# Patient Record
Sex: Male | Born: 1935 | Race: White | Hispanic: No | Marital: Married | Smoking: Never smoker
Health system: Southern US, Community
[De-identification: ages and names within clinical notes are randomized; demographics above are authoritative.]

## PROBLEM LIST (undated history)

## (undated) DIAGNOSIS — J45909 Unspecified asthma, uncomplicated: Secondary | ICD-10-CM

## (undated) DIAGNOSIS — K449 Diaphragmatic hernia without obstruction or gangrene: Secondary | ICD-10-CM

## (undated) DIAGNOSIS — M810 Age-related osteoporosis without current pathological fracture: Secondary | ICD-10-CM

## (undated) DIAGNOSIS — J189 Pneumonia, unspecified organism: Secondary | ICD-10-CM

## (undated) DIAGNOSIS — G4710 Hypersomnia, unspecified: Secondary | ICD-10-CM

## (undated) DIAGNOSIS — N529 Male erectile dysfunction, unspecified: Secondary | ICD-10-CM

## (undated) DIAGNOSIS — J309 Allergic rhinitis, unspecified: Secondary | ICD-10-CM

## (undated) DIAGNOSIS — B029 Zoster without complications: Secondary | ICD-10-CM

## (undated) DIAGNOSIS — K253 Acute gastric ulcer without hemorrhage or perforation: Secondary | ICD-10-CM

## (undated) DIAGNOSIS — M1990 Unspecified osteoarthritis, unspecified site: Secondary | ICD-10-CM

## (undated) DIAGNOSIS — M40209 Unspecified kyphosis, site unspecified: Secondary | ICD-10-CM

## (undated) DIAGNOSIS — E785 Hyperlipidemia, unspecified: Secondary | ICD-10-CM

## (undated) DIAGNOSIS — I2510 Atherosclerotic heart disease of native coronary artery without angina pectoris: Secondary | ICD-10-CM

## (undated) DIAGNOSIS — Z5189 Encounter for other specified aftercare: Secondary | ICD-10-CM

## (undated) DIAGNOSIS — D649 Anemia, unspecified: Secondary | ICD-10-CM

## (undated) DIAGNOSIS — G4733 Obstructive sleep apnea (adult) (pediatric): Secondary | ICD-10-CM

## (undated) DIAGNOSIS — M6530 Trigger finger, unspecified finger: Secondary | ICD-10-CM

## (undated) DIAGNOSIS — E559 Vitamin D deficiency, unspecified: Secondary | ICD-10-CM

## (undated) DIAGNOSIS — K219 Gastro-esophageal reflux disease without esophagitis: Secondary | ICD-10-CM

## (undated) DIAGNOSIS — I10 Essential (primary) hypertension: Secondary | ICD-10-CM

## (undated) DIAGNOSIS — R972 Elevated prostate specific antigen [PSA]: Secondary | ICD-10-CM

## (undated) DIAGNOSIS — N400 Enlarged prostate without lower urinary tract symptoms: Secondary | ICD-10-CM

## (undated) DIAGNOSIS — G4730 Sleep apnea, unspecified: Secondary | ICD-10-CM

## (undated) DIAGNOSIS — D126 Benign neoplasm of colon, unspecified: Secondary | ICD-10-CM

## (undated) DIAGNOSIS — M545 Low back pain, unspecified: Secondary | ICD-10-CM

## (undated) HISTORY — DX: Trigger finger, unspecified finger: M65.30

## (undated) HISTORY — DX: Diaphragmatic hernia without obstruction or gangrene: K44.9

## (undated) HISTORY — DX: Atherosclerotic heart disease of native coronary artery without angina pectoris: I25.10

## (undated) HISTORY — DX: Low back pain, unspecified: M54.50

## (undated) HISTORY — DX: Anemia, unspecified: D64.9

## (undated) HISTORY — PX: DENTAL SURGERY: SHX609

## (undated) HISTORY — PX: UPPER GI ENDOSCOPY: SHX6162

## (undated) HISTORY — DX: Sleep apnea, unspecified: G47.30

## (undated) HISTORY — DX: Elevated prostate specific antigen (PSA): R97.20

## (undated) HISTORY — DX: Low back pain: M54.5

## (undated) HISTORY — DX: Male erectile dysfunction, unspecified: N52.9

## (undated) HISTORY — DX: Hypersomnia, unspecified: G47.10

## (undated) HISTORY — DX: Acute gastric ulcer without hemorrhage or perforation: K25.3

## (undated) HISTORY — DX: Age-related osteoporosis without current pathological fracture: M81.0

## (undated) HISTORY — DX: Unspecified asthma, uncomplicated: J45.909

## (undated) HISTORY — DX: Encounter for other specified aftercare: Z51.89

## (undated) HISTORY — DX: Gastro-esophageal reflux disease without esophagitis: K21.9

## (undated) HISTORY — DX: Allergic rhinitis, unspecified: J30.9

## (undated) HISTORY — PX: COLONOSCOPY: SHX174

## (undated) HISTORY — DX: Vitamin D deficiency, unspecified: E55.9

## (undated) HISTORY — DX: Obstructive sleep apnea (adult) (pediatric): G47.33

## (undated) HISTORY — DX: Benign neoplasm of colon, unspecified: D12.6

## (undated) HISTORY — DX: Essential (primary) hypertension: I10

## (undated) HISTORY — DX: Hyperlipidemia, unspecified: E78.5

## (undated) HISTORY — DX: Zoster without complications: B02.9

---
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
## 1998-01-09 ENCOUNTER — Emergency Department (HOSPITAL_COMMUNITY): Admission: EM | Admit: 1998-01-09 | Discharge: 1998-01-10 | Payer: Self-pay | Admitting: Emergency Medicine

## 1998-04-06 HISTORY — PX: ROTATOR CUFF REPAIR: SHX139

## 1998-08-29 ENCOUNTER — Emergency Department (HOSPITAL_COMMUNITY): Admission: EM | Admit: 1998-08-29 | Discharge: 1998-08-29 | Payer: Self-pay | Admitting: Emergency Medicine

## 2001-10-18 ENCOUNTER — Emergency Department (HOSPITAL_COMMUNITY): Admission: EM | Admit: 2001-10-18 | Discharge: 2001-10-18 | Payer: Self-pay

## 2003-03-08 ENCOUNTER — Ambulatory Visit (HOSPITAL_COMMUNITY): Admission: RE | Admit: 2003-03-08 | Discharge: 2003-03-08 | Payer: Self-pay | Admitting: *Deleted

## 2003-03-08 ENCOUNTER — Encounter (INDEPENDENT_AMBULATORY_CARE_PROVIDER_SITE_OTHER): Payer: Self-pay | Admitting: Specialist

## 2003-04-07 LAB — HM COLONOSCOPY: HM Colonoscopy: NORMAL

## 2004-06-01 ENCOUNTER — Ambulatory Visit (HOSPITAL_COMMUNITY): Admission: RE | Admit: 2004-06-01 | Discharge: 2004-06-01 | Payer: Self-pay | Admitting: Allergy

## 2004-12-15 ENCOUNTER — Ambulatory Visit (HOSPITAL_BASED_OUTPATIENT_CLINIC_OR_DEPARTMENT_OTHER): Admission: RE | Admit: 2004-12-15 | Discharge: 2004-12-15 | Payer: Self-pay | Admitting: Otolaryngology

## 2004-12-24 ENCOUNTER — Ambulatory Visit: Payer: Self-pay | Admitting: Internal Medicine

## 2005-02-12 ENCOUNTER — Ambulatory Visit: Payer: Self-pay | Admitting: Internal Medicine

## 2005-03-15 ENCOUNTER — Ambulatory Visit: Payer: Self-pay | Admitting: Internal Medicine

## 2005-07-12 ENCOUNTER — Ambulatory Visit: Payer: Self-pay | Admitting: Internal Medicine

## 2006-07-12 ENCOUNTER — Ambulatory Visit: Payer: Self-pay | Admitting: Internal Medicine

## 2007-09-03 ENCOUNTER — Ambulatory Visit: Payer: Self-pay | Admitting: Internal Medicine

## 2007-09-03 DIAGNOSIS — G2589 Other specified extrapyramidal and movement disorders: Secondary | ICD-10-CM | POA: Insufficient documentation

## 2007-09-03 DIAGNOSIS — G4730 Sleep apnea, unspecified: Secondary | ICD-10-CM

## 2007-09-03 DIAGNOSIS — G4710 Hypersomnia, unspecified: Secondary | ICD-10-CM | POA: Insufficient documentation

## 2007-11-03 ENCOUNTER — Emergency Department (HOSPITAL_BASED_OUTPATIENT_CLINIC_OR_DEPARTMENT_OTHER): Admission: EM | Admit: 2007-11-03 | Discharge: 2007-11-03 | Payer: Self-pay | Admitting: Emergency Medicine

## 2007-11-04 ENCOUNTER — Emergency Department (HOSPITAL_BASED_OUTPATIENT_CLINIC_OR_DEPARTMENT_OTHER): Admission: EM | Admit: 2007-11-04 | Discharge: 2007-11-04 | Payer: Self-pay | Admitting: Emergency Medicine

## 2008-04-29 ENCOUNTER — Emergency Department (HOSPITAL_BASED_OUTPATIENT_CLINIC_OR_DEPARTMENT_OTHER): Admission: EM | Admit: 2008-04-29 | Discharge: 2008-04-29 | Payer: Self-pay | Admitting: Emergency Medicine

## 2008-04-29 ENCOUNTER — Ambulatory Visit: Payer: Self-pay | Admitting: Diagnostic Radiology

## 2008-05-07 HISTORY — PX: CORONARY ARTERY BYPASS GRAFT: SHX141

## 2008-05-17 ENCOUNTER — Ambulatory Visit: Payer: Self-pay | Admitting: Diagnostic Radiology

## 2008-05-17 ENCOUNTER — Emergency Department (HOSPITAL_BASED_OUTPATIENT_CLINIC_OR_DEPARTMENT_OTHER): Admission: EM | Admit: 2008-05-17 | Discharge: 2008-05-17 | Payer: Self-pay | Admitting: Emergency Medicine

## 2008-09-02 ENCOUNTER — Ambulatory Visit: Payer: Self-pay | Admitting: Internal Medicine

## 2008-09-02 DIAGNOSIS — I10 Essential (primary) hypertension: Secondary | ICD-10-CM | POA: Insufficient documentation

## 2008-09-02 DIAGNOSIS — K219 Gastro-esophageal reflux disease without esophagitis: Secondary | ICD-10-CM | POA: Insufficient documentation

## 2008-10-10 ENCOUNTER — Emergency Department (HOSPITAL_BASED_OUTPATIENT_CLINIC_OR_DEPARTMENT_OTHER): Admission: EM | Admit: 2008-10-10 | Discharge: 2008-10-10 | Payer: Self-pay | Admitting: Emergency Medicine

## 2008-10-10 ENCOUNTER — Ambulatory Visit: Payer: Self-pay | Admitting: Diagnostic Radiology

## 2009-01-18 ENCOUNTER — Emergency Department (HOSPITAL_BASED_OUTPATIENT_CLINIC_OR_DEPARTMENT_OTHER): Admission: EM | Admit: 2009-01-18 | Discharge: 2009-01-18 | Payer: Self-pay | Admitting: Emergency Medicine

## 2009-01-18 ENCOUNTER — Ambulatory Visit: Payer: Self-pay | Admitting: Radiology

## 2009-02-04 DIAGNOSIS — Z87898 Personal history of other specified conditions: Secondary | ICD-10-CM | POA: Insufficient documentation

## 2009-02-04 DIAGNOSIS — J40 Bronchitis, not specified as acute or chronic: Secondary | ICD-10-CM | POA: Insufficient documentation

## 2009-02-04 DIAGNOSIS — G4733 Obstructive sleep apnea (adult) (pediatric): Secondary | ICD-10-CM | POA: Insufficient documentation

## 2009-02-09 ENCOUNTER — Ambulatory Visit: Payer: Self-pay | Admitting: Cardiology

## 2009-02-09 ENCOUNTER — Encounter: Payer: Self-pay | Admitting: Cardiology

## 2009-02-09 DIAGNOSIS — R079 Chest pain, unspecified: Secondary | ICD-10-CM | POA: Insufficient documentation

## 2009-02-09 DIAGNOSIS — I2789 Other specified pulmonary heart diseases: Secondary | ICD-10-CM | POA: Insufficient documentation

## 2009-02-17 ENCOUNTER — Ambulatory Visit: Payer: Self-pay | Admitting: Cardiovascular Disease

## 2009-02-17 ENCOUNTER — Ambulatory Visit: Payer: Self-pay | Admitting: Internal Medicine

## 2009-02-17 ENCOUNTER — Encounter: Payer: Self-pay | Admitting: Cardiology

## 2009-02-17 ENCOUNTER — Ambulatory Visit: Payer: Self-pay

## 2009-02-17 ENCOUNTER — Ambulatory Visit (HOSPITAL_COMMUNITY): Admission: RE | Admit: 2009-02-17 | Discharge: 2009-02-17 | Payer: Self-pay | Admitting: Cardiovascular Disease

## 2009-02-18 ENCOUNTER — Emergency Department (HOSPITAL_BASED_OUTPATIENT_CLINIC_OR_DEPARTMENT_OTHER): Admission: EM | Admit: 2009-02-18 | Discharge: 2009-02-18 | Payer: Self-pay | Admitting: Emergency Medicine

## 2009-03-02 ENCOUNTER — Telehealth (INDEPENDENT_AMBULATORY_CARE_PROVIDER_SITE_OTHER): Payer: Self-pay | Admitting: *Deleted

## 2009-03-03 ENCOUNTER — Ambulatory Visit: Payer: Self-pay | Admitting: Cardiovascular Disease

## 2009-03-03 ENCOUNTER — Ambulatory Visit: Payer: Self-pay

## 2009-03-03 ENCOUNTER — Encounter: Payer: Self-pay | Admitting: Cardiology

## 2009-03-03 ENCOUNTER — Encounter (INDEPENDENT_AMBULATORY_CARE_PROVIDER_SITE_OTHER): Payer: Self-pay | Admitting: *Deleted

## 2009-03-03 ENCOUNTER — Encounter (HOSPITAL_COMMUNITY): Admission: RE | Admit: 2009-03-03 | Discharge: 2009-05-04 | Payer: Self-pay | Admitting: Cardiology

## 2009-03-03 DIAGNOSIS — R9430 Abnormal result of cardiovascular function study, unspecified: Secondary | ICD-10-CM | POA: Insufficient documentation

## 2009-03-04 ENCOUNTER — Ambulatory Visit: Payer: Self-pay | Admitting: Cardiology

## 2009-03-04 ENCOUNTER — Ambulatory Visit: Payer: Self-pay | Admitting: Surgery

## 2009-03-04 ENCOUNTER — Inpatient Hospital Stay (HOSPITAL_COMMUNITY): Admission: RE | Admit: 2009-03-04 | Discharge: 2009-03-14 | Payer: Self-pay | Admitting: Cardiology

## 2009-03-04 HISTORY — PX: CARDIAC CATHETERIZATION: SHX172

## 2009-03-05 ENCOUNTER — Encounter: Payer: Self-pay | Admitting: Cardiology

## 2009-03-09 ENCOUNTER — Encounter: Payer: Self-pay | Admitting: Cardiology

## 2009-03-14 ENCOUNTER — Encounter: Payer: Self-pay | Admitting: Cardiology

## 2009-03-17 ENCOUNTER — Telehealth (INDEPENDENT_AMBULATORY_CARE_PROVIDER_SITE_OTHER): Payer: Self-pay | Admitting: Physician Assistant

## 2009-03-17 ENCOUNTER — Ambulatory Visit: Payer: Self-pay | Admitting: Radiology

## 2009-03-17 ENCOUNTER — Emergency Department (HOSPITAL_BASED_OUTPATIENT_CLINIC_OR_DEPARTMENT_OTHER): Admission: EM | Admit: 2009-03-17 | Discharge: 2009-03-17 | Payer: Self-pay | Admitting: Emergency Medicine

## 2009-03-18 ENCOUNTER — Encounter: Payer: Self-pay | Admitting: Cardiology

## 2009-03-18 ENCOUNTER — Ambulatory Visit: Payer: Self-pay | Admitting: Cardiothoracic Surgery

## 2009-03-21 ENCOUNTER — Telehealth (INDEPENDENT_AMBULATORY_CARE_PROVIDER_SITE_OTHER): Payer: Self-pay | Admitting: *Deleted

## 2009-03-24 ENCOUNTER — Encounter: Payer: Self-pay | Admitting: Cardiology

## 2009-03-25 ENCOUNTER — Ambulatory Visit: Payer: Self-pay | Admitting: Cardiothoracic Surgery

## 2009-04-05 ENCOUNTER — Telehealth: Payer: Self-pay | Admitting: Cardiology

## 2009-04-06 ENCOUNTER — Encounter: Payer: Self-pay | Admitting: Cardiology

## 2009-04-06 ENCOUNTER — Ambulatory Visit: Payer: Self-pay | Admitting: Cardiology

## 2009-04-06 DIAGNOSIS — E785 Hyperlipidemia, unspecified: Secondary | ICD-10-CM | POA: Insufficient documentation

## 2009-04-06 DIAGNOSIS — I2510 Atherosclerotic heart disease of native coronary artery without angina pectoris: Secondary | ICD-10-CM | POA: Insufficient documentation

## 2009-04-07 ENCOUNTER — Ambulatory Visit: Payer: Self-pay | Admitting: Thoracic Surgery (Cardiothoracic Vascular Surgery)

## 2009-04-07 ENCOUNTER — Encounter: Payer: Self-pay | Admitting: Cardiology

## 2009-04-07 ENCOUNTER — Telehealth (INDEPENDENT_AMBULATORY_CARE_PROVIDER_SITE_OTHER): Payer: Self-pay | Admitting: *Deleted

## 2009-04-07 ENCOUNTER — Encounter
Admission: RE | Admit: 2009-04-07 | Discharge: 2009-04-07 | Payer: Self-pay | Admitting: Thoracic Surgery (Cardiothoracic Vascular Surgery)

## 2009-04-11 ENCOUNTER — Telehealth (INDEPENDENT_AMBULATORY_CARE_PROVIDER_SITE_OTHER): Payer: Self-pay | Admitting: *Deleted

## 2009-05-19 ENCOUNTER — Encounter: Admission: RE | Admit: 2009-05-19 | Discharge: 2009-05-19 | Payer: Self-pay | Admitting: Internal Medicine

## 2009-06-01 ENCOUNTER — Telehealth (INDEPENDENT_AMBULATORY_CARE_PROVIDER_SITE_OTHER): Payer: Self-pay | Admitting: *Deleted

## 2009-06-13 ENCOUNTER — Encounter: Payer: Self-pay | Admitting: Cardiology

## 2009-06-14 ENCOUNTER — Encounter: Admission: RE | Admit: 2009-06-14 | Discharge: 2009-09-12 | Payer: Self-pay | Admitting: Orthopedic Surgery

## 2009-07-18 ENCOUNTER — Encounter: Payer: Self-pay | Admitting: Cardiology

## 2009-08-01 ENCOUNTER — Emergency Department (HOSPITAL_BASED_OUTPATIENT_CLINIC_OR_DEPARTMENT_OTHER): Admission: EM | Admit: 2009-08-01 | Discharge: 2009-08-01 | Payer: Self-pay | Admitting: Emergency Medicine

## 2009-11-02 ENCOUNTER — Ambulatory Visit: Payer: Self-pay | Admitting: Cardiology

## 2009-11-02 ENCOUNTER — Encounter: Payer: Self-pay | Admitting: Cardiology

## 2009-11-09 ENCOUNTER — Encounter: Payer: Self-pay | Admitting: Internal Medicine

## 2009-11-09 ENCOUNTER — Ambulatory Visit: Payer: Self-pay | Admitting: Diagnostic Radiology

## 2009-11-09 ENCOUNTER — Emergency Department (HOSPITAL_BASED_OUTPATIENT_CLINIC_OR_DEPARTMENT_OTHER): Admission: EM | Admit: 2009-11-09 | Discharge: 2009-11-09 | Payer: Self-pay | Admitting: Emergency Medicine

## 2009-11-10 ENCOUNTER — Ambulatory Visit: Payer: Self-pay | Admitting: Internal Medicine

## 2009-11-10 DIAGNOSIS — B370 Candidal stomatitis: Secondary | ICD-10-CM | POA: Insufficient documentation

## 2010-06-06 NOTE — Assessment & Plan Note (Signed)
Summary: [facility]   Visit Type:  Follow-up Primary Provider:  Art Chilton Si   History of Present Illness: Pleasant gentleman evaluated in October, 2010 for question pulmonary hypertension. An echocardiogram was ordered and showed an ejection fraction of 50-55% with apical and septal hypokinesis. A Myoview was therefore performed and showed severe anterior ischemia and an ejection fraction of 47%. He  underwent cardiac catheterization on March 04, 2009 and this revealed severe three-vessel coronary disease as well as an 80% left main. His ejection fraction was 50%. He ultimately underwent coronary artery bypass and graft on November 1 (left internal mammary artery to left anterior descending, sequential saphenous vein graft to ramus intermedius and obtuse marginal 2, saphenous vein graft to posterior descending). I last saw him in Dec 2010. Since then, the patient denies any dyspnea on exertion, orthopnea, PND, pedal edema, palpitations, syncope or chest pain.   Current Medications (verified): 1)  Advair Diskus 100-50 Mcg/dose  Misc (Fluticasone-Salmeterol) .... Inhale 1 Puff Two Times A Day 2)  Omeprazole 20 Mg  Cpdr (Omeprazole) .... Take 1 Capsule By Mouth Two Times A Day 3)  Atenolol 50 Mg  Tabs (Atenolol) .... Take 1 Tablet By Mouth Once A Day 4)  Avodart 0.5 Mg  Caps (Dutasteride) .... Take 1 Capsule By Mouth Once A Day 5)  Doxazosin Mesylate 4 Mg  Tabs (Doxazosin Mesylate) .... Take 1 Tablet By Mouth Once A Day 6)  Singulair 10 Mg  Tabs (Montelukast Sodium) .... Take 1 Tablet By Mouth Once A Day 7)  Tylenol 325 Mg  Tabs (Acetaminophen) .... Per Bottle 8)  Xyzal 5 Mg Tabs (Levocetirizine Dihydrochloride) .... Take 1 Tablet By Mouth Once A Day 9)  Cpap Autopap 4-20 Ranges 6-11 .... Advanced 10)  Oxycodone-Acetaminophen 5-500 Mg Caps (Oxycodone-Acetaminophen) .... As Needed 11)  Crestor 20 Mg Tabs (Rosuvastatin Calcium) .... Take 1 Tablet By Mouth Once A Day 12)  Aspirin Ec 325 Mg  Tbec (Aspirin) .... 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  Alcohol Use - yes  Review of Systems       no fevers or chills, productive cough, hemoptysis, dysphasia, odynophagia, melena, hematochezia, dysuria, hematuria, rash, seizure activity, orthopnea, PND, pedal edema, claudication. Remaining systems are negative.   Vital Signs:  Patient profile:   75 year old male Height:      67 inches Pulse rate:   73 / minute Pulse rhythm:   regular BP sitting:   110 / 60  (left arm) Cuff size:   large  Vitals Entered By: Vikki Ports (November 02, 2009 8:54 AM)  Physical Exam  General:  Well-developed well-nourished in no acute distress.  Skin is warm and dry.  HEENT is normal.  Neck is supple. No thyromegaly.  Chest is clear to auscultation with normal expansion.  Cardiovascular exam is regular rate and rhythm.  Abdominal exam nontender or distended. No masses palpated. Extremities show no edema. neuro grossly intact    EKG  Procedure date:  11/02/2009  Findings:      Normal sinus rhythm at  a rate of 73. Axis normal. Nonspecific T-wave changes.  Impression & Recommendations:  Problem # 1:  PURE HYPERCHOLESTEROLEMIA (ICD-272.0) Continue statin. Lipids and liver monitored by primary care. His updated medication list for this problem includes:    Crestor 20 Mg Tabs (Rosuvastatin calcium) .Marland Kitchen... Take 1  tablet by mouth once a day  His updated medication list for this problem includes:    Crestor 20 Mg Tabs (Rosuvastatin calcium) .Marland Kitchen... Take 1 tablet by mouth once a day  Problem # 2:  CAD (ICD-414.00) No recent chest pain. Continue aspirin, beta blocker and statin. Continue risk factor modification. His updated medication list for this problem includes:    Atenolol 50 Mg Tabs (Atenolol) .Marland Kitchen... Take 1 tablet by mouth once a day    Aspirin Ec 325 Mg Tbec (Aspirin) .Marland Kitchen... Take one tablet by mouth daily  Problem # 3:  HYPERTENSION (ICD-401.9)  Blood pressure controlled on present medications. Will continue. Renal function and potassium monitored by primary care.  His updated medication list for this problem includes:    Atenolol 50 Mg Tabs (Atenolol) .Marland Kitchen... Take 1 tablet by mouth once a day    Doxazosin Mesylate 4 Mg Tabs (Doxazosin mesylate) .Marland Kitchen... Take 1 tablet by mouth once a day    Aspirin Ec 325 Mg Tbec (Aspirin) .Marland Kitchen... Take one tablet by mouth daily  Problem # 4:  HYPERTENSION, PULMONARY (ICD-416.8) Most likely secondary to history of obstructive sleep apnea.  Problem # 5:  G E R D (ICD-530.81)  His updated medication list for this problem includes:    Omeprazole 20 Mg Cpdr (Omeprazole) .Marland Kitchen... Take 1 capsule by mouth two times a day  Problem # 6:  SLEEP APNEA (ICD-780.57)  Patient Instructions: 1)  Your physician recommends that you schedule a follow-up appointment in: ONE YEAR

## 2010-06-06 NOTE — Progress Notes (Signed)
  [employer] request for Records,forwarded to [facility]  June 01, 2009 11:28 AM

## 2010-06-06 NOTE — Letter (Signed)
Summary: [facility]  [facility]   Imported By: Marylou Mccoy 07/13/2009 08:40:15  _____________________________________________________________________  External Attachment:    Type:   Image     Comment:   External Document

## 2010-06-06 NOTE — Letter (Signed)
Summary: [facility] - [facility]  [facility] - Cardiac Treatment Plan   Imported By: Marylou Mccoy 10/21/2009 17:56:16  _____________________________________________________________________  External Attachment:    Type:   Image     Comment:   External Document

## 2010-06-06 NOTE — Assessment & Plan Note (Signed)
Summary: rov//mbw   Primary Provider/Referring Provider:  Art Chilton Si  CC[facility], sob wheezing, and followup pulmonary hypertension.  History of Present Illness: History of Present Illness: 09/03/07- Brent Griffith returns for follow up of his sleep apnea.  He says he is doing much better with CPAP.  He is comfortable and used to it.  Now  using it every night.  It is set on auto titration, but he says the pressure holds around 10 CWP.  With this his wife tells him he does not snore.  The family dog keeps him awake barking at night, more than the other problems.  .  He still has nocturia several times at night.  He mentions a little bit of a seasonal rhinitis this spring, but not bad.  09/02/08- OSA, Periodic limb movement Notes increased seasonal allergy this year. Sees Dr [place] Callas. CPAP autotitration permanently 4-20 cwp, usually holding around 10. Advanced, full face mask. Discussed care of humidifier. Discussed adjustment of autotitration mode.  February 17, 2009- OSA, Periodic limb movement., PHTN...Marland KitchenMarland KitchenDr [place] Callas for allergy/ Asthma Being evaluatd by cardiology for question og PHTN, pending Echo. Compliant everyt night with CPAP- using Auto PAP 4-20, usually ranging around 6-11. We discussed his cleaning program, mask replacement and general care. Wife tells he doesn't snore through. He feels well rested. He denies change or problem with his asthma control. Denies shortness of breath. Had PFT at Dr Lyla Son. Known T3 compression fx with associated radicular pains.  November 10, 2009- OSA, Periodic limb movement, CAD/CABG, Dr [place] Callas for allergy/asthma....wife here Had CABG Mar 07, 2009. Cardiology attributed PHTN to his hx of OSA. Went to [employer] last night, feeling tight in throat, with dry cough. They gave neb and pred 60 mg yest, then 40 mg x 5 days. Wife's Home nebulizer didn't help. Has been exercising regularly on treadmill. A day before cough began, he was exposed to  fresh paint smell. He thinks this irritated his airway.    Preventive Screening-Counseling & Management  Alcohol-Tobacco     Smoking Status: never  Current Medications (verified): 1)  Advair Diskus 100-50 Mcg/dose  Misc (Fluticasone-Salmeterol) .... Inhale 1 Puff Two Times A Day 2)  Omeprazole 40 Mg Cpdr (Omeprazole) .... Take 1 By Mouth Once Daily 3)  Atenolol 50 Mg  Tabs (Atenolol) .... Take 1 Tablet By Mouth Once A Day 4)  Avodart 0.5 Mg  Caps (Dutasteride) .... Take 1 Capsule By Mouth Once A Day 5)  Doxazosin Mesylate 4 Mg  Tabs (Doxazosin Mesylate) .... Take 1 Tablet By Mouth Once A Day 6)  Singulair 10 Mg  Tabs (Montelukast Sodium) .... Take 1 Tablet By Mouth Once A Day 7)  Xyzal 5 Mg Tabs (Levocetirizine Dihydrochloride) .... Take 1 Tablet By Mouth Once A Day 8)  Cpap Autopap 4-20 Ranges 6-11 .... Advanced 9)  Crestor 20 Mg Tabs (Rosuvastatin Calcium) .... Take 1 Tablet By Mouth Once A Day 10)  Aspirin Ec 325 Mg Tbec (Aspirin) .... Take One Tablet By Mouth Daily 11)  Astepro 0.15 % Soln (Azelastine Hcl) .Marland Kitchen.. 1-2 Sprays in Each Nostril 12)  Calcium Citrate 250 Mg Tabs (Calcium Citrate) .... Take 1 By Mouth Once Daily 13)  Cinnamon 500 Mg Caps (Cinnamon) .... Take 1 By Mouth Once Daily 14)  Coq10 100 Mg Caps (Coenzyme Q10) .... Take 1 By Mouth Once Daily 15)  Forteo 600 Mcg/2.63ml Soln (Teriparatide (Recombinant)) .... As Directed 16)  Glucosamine 500 Mg Caps (Glucosamine Sulfate) .... Take  1 By Mouth Once Daily 17)  Multivitamins  Tabs (Multiple Vitamin) .... Take 1 By Mouth Once Daily 18)  Nasacort Aq 55 Mcg/act Aers (Triamcinolone Acetonide(Nasal)) .Marland Kitchen.. 1-2 Sprays in Each Nostril Once Daily 19)  Optivar 0.05 % Soln (Azelastine Hcl) .... Use As Directed 20)  Vitamin D 1000 Unit Tabs (Cholecalciferol) .... 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 Father- died acute renal failure, hypertension, prior CABG and valve replacement at age 92.  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  Alcohol Use - yes  Risk Factors: Smoking Status: never (11/10/2009)  Review of Systems      See HPI       The patient complains of shortness of breath with activity and non-productive cough.  The patient denies shortness of breath at rest, productive cough, coughing up blood, chest pain, irregular heartbeats, acid heartburn, indigestion, loss of appetite, weight change, abdominal pain, difficulty swallowing, sore throat, tooth/dental problems, headaches, nasal congestion/difficulty breathing through nose, and sneezing.    Vital Signs:  Patient profile:   75 year old male Height:      67 inches Weight:      147 pounds BMI:     23.11 O2 Sat:      94 % on Room air Pulse rate:   86 / minute BP sitting:   120 / 72  (left arm) Cuff size:   regular  Vitals Entered By: Kandice Hams CMA (November 10, 2009 4:04 PM)  O2 Flow:  Room air CC: [facility],sob wheezing, followup pulmonary hypertension   Physical Exam  Additional Exam:  General: A/Ox3; pleasant and cooperative, NAD, SKIN: no rash, lesions NODES: no lymphadenopathy HEENT: Brent Griffith, EOM- WNL, Conjuctivae- clear, PERRLA, TM-WNL, Nose- clear, Throat- clear and wnl, Mallampati II, dental repair. Thrush, hoarse, no stridor NECK: Supple w/ fair ROM, JVD- none, normal carotid impulses w/o bruits Thyroid-  CHEST: Clear to P&A,  unlabored HEART: RRR, no m/g/r heard, P2 not increased ABDOMEN:  ZOX:WRUE, nl pulses, no edema  NEURO: Grossly intact to observation      Impression & Recommendations:  Problem # 1:  BRONCHITIS (ICD-490)  He has had some cough, but the most obvious throat problem is thrush. We will give magic mouth wash. His dyspnea corresponds to throat discomfort, with no rales or wheeze, good O2 sat and heart rate. We discussed management. His updated medication list for this problem includes:    Advair Diskus 100-50 Mcg/dose Misc (Fluticasone-salmeterol) ..... Inhale 1 puff two times a day    Singulair 10 Mg Tabs (Montelukast sodium) .Marland Kitchen... Take 1 tablet by mouth once a day    Tussionex Pennkinetic Er 8-10 Mg/75ml Lqcr (Chlorpheniramine-hydrocodone) .Marland Kitchen... 1 teaspoon two times a day as needed cough  Problem # 2:  HYPERTENSION, PULMONARY (ICD-416.8) Since he is doing well with CPAP contol of OSA, hopefully the improved oxygenation will reduce pulmonary artery pressure.  Problem # 3:  HYPERSOMNIA, ASSOCIATED WITH SLEEP APNEA (ICD-780.53)  Continues CPAP AutpoPaP every night all night. He likes it.  Medications Added  to Medication List This Visit: 1)  Omeprazole 40 Mg Cpdr (Omeprazole) .... Take 1 by mouth once daily 2)  Astepro 0.15 % Soln (Azelastine hcl) .Marland Kitchen.. 1-2 sprays in each nostril 3)  Calcium Citrate 250 Mg Tabs (Calcium citrate) .... Take 1 by mouth once daily 4)  Cinnamon 500 Mg Caps (Cinnamon) .... Take 1 by mouth once daily 5)  Coq10 100 Mg Caps (Coenzyme q10) .... Take 1 by mouth once daily 6)  Forteo 600 Mcg/2.15ml Soln (Teriparatide (recombinant)) .... As directed 7)  Glucosamine 500 Mg Caps (Glucosamine sulfate) .... Take 1 by mouth once daily 8)  Multivitamins Tabs (Multiple vitamin) .... Take 1 by mouth once daily 9)  Nasacort Aq 55 Mcg/act Aers (Triamcinolone acetonide(nasal)) .Marland Kitchen.. 1-2 sprays in each nostril once daily 10)  Optivar 0.05 % Soln (Azelastine hcl) .... Use as  directed 11)  Vitamin D 1000 Unit Tabs (Cholecalciferol) .... Take 1 by mouth once daily 12)  [facility] Magic Mouthwash  .... 1 tablespoon swish and swallow four times a day 13)  Tussionex Pennkinetic Er 8-10 Mg/28ml Lqcr (Chlorpheniramine-hydrocodone) .Marland Kitchen.. 1 teaspoon two times a day as needed cough  Other Orders: Est. Patient Level IV (16109)  Patient Instructions: 1)  Please schedule a follow-up appointment in 1 year. 2)  Script for magic mouthwash 3)  Script for Tusionex 4)  Take it easy in heat  5)  Try using Advair before meals to see if that will keep your throat clearer. 6)  CC Dr [place] Callas, Art Chilton Si Prescriptions: Sandria Senter ER 8-10 MG/5ML LQCR (CHLORPHENIRAMINE-HYDROCODONE) 1 teaspoon two times a day as needed cough  #200 ml x 1   Entered and Authorized by:   Waymon Budge MD   Signed by:   Waymon Budge MD on 11/10/2009   Method used:   Print then Give to Patient   RxID:   [id number] [facility] MAGIC MOUTHWASH 1 tablespoon swish and swallow four times a day  #200 ml x 0   Entered and Authorized by:   Waymon Budge MD   Signed by:   Waymon Budge MD on 11/10/2009   Method used:   Print then Give to Patient   RxID:   [id number]

## 2010-08-09 LAB — CREATININE, SERUM
Creatinine, Ser: 0.76 mg/dL (ref 0.4–1.5)
GFR calc Af Amer: >60 mL/min (ref >60)
GFR calc non Af Amer: >60 mL/min (ref >60)

## 2010-08-09 LAB — BASIC METABOLIC PANEL
BUN: 15 mg/dL (ref 6–23)
BUN: 8 mg/dL (ref 6–23)
CO2: 25 mEq/L (ref 19–32)
Calcium: 8.1 mg/dL — ABNORMAL LOW (ref 8.4–10.5)
Calcium: 8.3 mg/dL — ABNORMAL LOW (ref 8.4–10.5)
Chloride: 105 mEq/L (ref 96–112)
Chloride: 100 mEq/L (ref 96–112)
Creatinine, Ser: 0.83 mg/dL (ref 0.4–1.5)
Creatinine, Ser: 1.08 mg/dL (ref 0.4–1.5)
GFR calc Af Amer: >60 mL/min (ref >60)
GFR calc Af Amer: >60 mL/min (ref >60)
GFR calc non Af Amer: >60 mL/min (ref >60)
Glucose, Bld: 133 mg/dL — ABNORMAL HIGH (ref 70–99)
Potassium: 4.5 mEq/L (ref 3.5–5.1)
Potassium: 4.5 mEq/L (ref 3.5–5.1)
Sodium: 138 mEq/L (ref 135–145)

## 2010-08-09 LAB — POCT I-STAT 3, ART BLOOD GAS (G3+)
Bicarbonate: 25.7 mEq/L — ABNORMAL HIGH (ref 20.0–24.0)
Bicarbonate: 20.9 mEq/L (ref 20.0–24.0)
O2 Saturation: 100 %
O2 Saturation: 100 %
Patient temperature: 35.5
TCO2: 22 mmol/L (ref 0–100)
TCO2: 28 mmol/L (ref 0–100)
pCO2 arterial: 46.5 mmHg — ABNORMAL HIGH (ref 35.0–45.0)
pCO2 arterial: 22.6 mmHg — ABNORMAL LOW (ref 35.0–45.0)
pCO2 arterial: 23.5 mmHg — ABNORMAL LOW (ref 35.0–45.0)
pCO2 arterial: 38.3 mmHg (ref 35.0–45.0)
pCO2 arterial: 41.2 mmHg (ref 35.0–45.0)
pCO2 arterial: 42.3 mmHg (ref 35.0–45.0)
pH, Arterial: 7.374 (ref 7.350–7.450)
pH, Arterial: 7.381 (ref 7.350–7.450)
pH, Arterial: 7.405 (ref 7.350–7.450)
pH, Arterial: 7.514 — ABNORMAL HIGH (ref 7.350–7.450)
pH, Arterial: 7.558 — ABNORMAL HIGH (ref 7.350–7.450)
pO2, Arterial: 459 mmHg — ABNORMAL HIGH (ref 80.0–100.0)
pO2, Arterial: 198 mmHg — ABNORMAL HIGH (ref 80.0–100.0)
pO2, Arterial: 260 mmHg — ABNORMAL HIGH (ref 80.0–100.0)

## 2010-08-09 LAB — POCT I-STAT 3, VENOUS BLOOD GAS (G3P V)
Bicarbonate: 22.7 mEq/L (ref 20.0–24.0)
O2 Saturation: 75 %
TCO2: 24 mmol/L (ref 0–100)
pCO2, Ven: 43.6 mmHg — ABNORMAL LOW (ref 45.0–50.0)
pH, Ven: 7.325 — ABNORMAL HIGH (ref 7.250–7.300)
pO2, Ven: 43 mmHg (ref 30.0–45.0)

## 2010-08-09 LAB — POCT I-STAT 4, (NA,K, GLUC, HGB,HCT)
Glucose, Bld: 111 mg/dL — ABNORMAL HIGH (ref 70–99)
Glucose, Bld: 111 mg/dL — ABNORMAL HIGH (ref 70–99)
Glucose, Bld: 124 mg/dL — ABNORMAL HIGH (ref 70–99)
Glucose, Bld: 132 mg/dL — ABNORMAL HIGH (ref 70–99)
HCT: 23 % — ABNORMAL LOW (ref 39.0–52.0)
HCT: 31 % — ABNORMAL LOW (ref 39.0–52.0)
HCT: 21 % — ABNORMAL LOW (ref 39.0–52.0)
HCT: 23 % — ABNORMAL LOW (ref 39.0–52.0)
HCT: 23 % — ABNORMAL LOW (ref 39.0–52.0)
HCT: 23 % — ABNORMAL LOW (ref 39.0–52.0)
Hemoglobin: 10.5 g/dL — ABNORMAL LOW (ref 13.0–17.0)
Hemoglobin: 7.8 g/dL — ABNORMAL LOW (ref 13.0–17.0)
Hemoglobin: 7.8 g/dL — ABNORMAL LOW (ref 13.0–17.0)
Potassium: 3.7 mEq/L (ref 3.5–5.1)
Potassium: 3.6 mEq/L (ref 3.5–5.1)
Potassium: 5.8 mEq/L — ABNORMAL HIGH (ref 3.5–5.1)
Sodium: 136 mEq/L (ref 135–145)
Sodium: 136 mEq/L (ref 135–145)
Sodium: 129 mEq/L — ABNORMAL LOW (ref 135–145)
Sodium: 132 mEq/L — ABNORMAL LOW (ref 135–145)
Sodium: 134 mEq/L — ABNORMAL LOW (ref 135–145)
Sodium: 138 mEq/L (ref 135–145)

## 2010-08-09 LAB — MAGNESIUM
Magnesium: 2.2 mg/dL (ref 1.5–2.5)
Magnesium: 2.6 mg/dL — ABNORMAL HIGH (ref 1.5–2.5)

## 2010-08-09 LAB — MRSA CULTURE

## 2010-08-09 LAB — POCT I-STAT, CHEM 8
Creatinine, Ser: 0.6 mg/dL (ref 0.4–1.5)
HCT: 30 % — ABNORMAL LOW (ref 39.0–52.0)
Hemoglobin: 10.2 g/dL — ABNORMAL LOW (ref 13.0–17.0)
Potassium: 4.1 mEq/L (ref 3.5–5.1)
Sodium: 136 mEq/L (ref 135–145)
TCO2: 18 mmol/L (ref 0–100)

## 2010-08-09 LAB — CBC
HCT: 26.6 % — ABNORMAL LOW (ref 39.0–52.0)
HCT: 27 % — ABNORMAL LOW (ref 39.0–52.0)
HCT: 29.7 % — ABNORMAL LOW (ref 39.0–52.0)
Hemoglobin: 10.5 g/dL — ABNORMAL LOW (ref 13.0–17.0)
Hemoglobin: 9.4 g/dL — ABNORMAL LOW (ref 13.0–17.0)
Hemoglobin: 9.9 g/dL — ABNORMAL LOW (ref 13.0–17.0)
MCHC: 34.6 g/dL (ref 30.0–36.0)
MCHC: 34.7 g/dL (ref 30.0–36.0)
MCHC: 34.8 g/dL (ref 30.0–36.0)
MCHC: 35.1 g/dL (ref 30.0–36.0)
MCV: 89 fL (ref 78.0–100.0)
MCV: 89.7 fL (ref 78.0–100.0)
MCV: 89.9 fL (ref 78.0–100.0)
MCV: 89 fL (ref 78.0–100.0)
Platelets: 105 10*3/uL — ABNORMAL LOW (ref 150–400)
Platelets: 166 10*3/uL (ref 150–400)
Platelets: 81 10*3/uL — ABNORMAL LOW (ref 150–400)
Platelets: 94 10*3/uL — ABNORMAL LOW (ref 150–400)
RBC: 2.66 MIL/uL — ABNORMAL LOW (ref 4.22–5.81)
RBC: 3.43 MIL/uL — ABNORMAL LOW (ref 4.22–5.81)
RBC: 3.33 MIL/uL — ABNORMAL LOW (ref 4.22–5.81)
RDW: 12.6 % (ref 11.5–15.5)
RDW: 12.6 % (ref 11.5–15.5)
WBC: 12.1 10*3/uL — ABNORMAL HIGH (ref 4.0–10.5)
WBC: 7.2 10*3/uL (ref 4.0–10.5)
WBC: 8.2 10*3/uL (ref 4.0–10.5)
WBC: 9.1 10*3/uL (ref 4.0–10.5)
WBC: 10.4 10*3/uL (ref 4.0–10.5)

## 2010-08-09 LAB — HEPARIN INDUCED THROMBOCYTOPENIA PNL
Heparin Induced Plt Ab: NEGATIVE
Serotonin Release: 0 % release (ref <20)

## 2010-08-09 LAB — DIFFERENTIAL
Eosinophils Absolute: 0.5 10*3/uL (ref 0.0–0.7)
Eosinophils Relative: 5 % (ref 0–5)
Lymphs Abs: 1.3 10*3/uL (ref 0.7–4.0)
Monocytes Absolute: 0.7 10*3/uL (ref 0.1–1.0)
Monocytes Relative: 7 % (ref 3–12)
Neutrophils Relative %: 75 % (ref 43–77)

## 2010-08-09 LAB — GLUCOSE, CAPILLARY
Glucose-Capillary: 132 mg/dL — ABNORMAL HIGH (ref 70–99)
Glucose-Capillary: 139 mg/dL — ABNORMAL HIGH (ref 70–99)
Glucose-Capillary: 144 mg/dL — ABNORMAL HIGH (ref 70–99)
Glucose-Capillary: 156 mg/dL — ABNORMAL HIGH (ref 70–99)
Glucose-Capillary: 86 mg/dL (ref 70–99)

## 2010-08-09 LAB — PROTIME-INR: INR: 1.23 (ref 0.00–1.49)

## 2010-08-09 LAB — HEMOGLOBIN AND HEMATOCRIT, BLOOD: HCT: 24.8 % — ABNORMAL LOW (ref 39.0–52.0)

## 2010-08-10 LAB — BASIC METABOLIC PANEL
BUN: 7 mg/dL (ref 6–23)
CO2: 24 mEq/L (ref 19–32)
CO2: 25 mEq/L (ref 19–32)
Chloride: 102 mEq/L (ref 96–112)
Chloride: 102 mEq/L (ref 96–112)
Creatinine, Ser: 0.76 mg/dL (ref 0.4–1.5)
GFR calc non Af Amer: >60 mL/min (ref >60)
GFR calc non Af Amer: >60 mL/min (ref >60)
Glucose, Bld: 103 mg/dL — ABNORMAL HIGH (ref 70–99)
Glucose, Bld: 110 mg/dL — ABNORMAL HIGH (ref 70–99)
Potassium: 4.2 mEq/L (ref 3.5–5.1)
Potassium: 4.4 mEq/L (ref 3.5–5.1)
Sodium: 136 mEq/L (ref 135–145)
Sodium: 139 mEq/L (ref 135–145)

## 2010-08-10 LAB — LIPID PANEL
Cholesterol: 185 mg/dL (ref 0–200)
Total CHOL/HDL Ratio: 4.7 RATIO

## 2010-08-10 LAB — CBC
HCT: 38.4 % — ABNORMAL LOW (ref 39.0–52.0)
HCT: 40.6 % (ref 39.0–52.0)
Hemoglobin: 13.5 g/dL (ref 13.0–17.0)
Hemoglobin: 14.1 g/dL (ref 13.0–17.0)
MCHC: 34.5 g/dL (ref 30.0–36.0)
MCV: 89.7 fL (ref 78.0–100.0)
Platelets: 150 10*3/uL (ref 150–400)
RBC: 4.55 MIL/uL (ref 4.22–5.81)
RDW: 12.6 % (ref 11.5–15.5)
RDW: 12.8 % (ref 11.5–15.5)

## 2010-08-10 LAB — URINALYSIS, ROUTINE W REFLEX MICROSCOPIC
Bilirubin Urine: NEGATIVE
Glucose, UA: NEGATIVE mg/dL
Hgb urine dipstick: NEGATIVE
Specific Gravity, Urine: 1.007 (ref 1.005–1.030)
Urobilinogen, UA: 0.2 mg/dL (ref 0.0–1.0)

## 2010-08-10 LAB — BLOOD GAS, ARTERIAL
Acid-base deficit: 0.9 mmol/L (ref 0.0–2.0)
Patient temperature: 98.6
TCO2: 24 mmol/L (ref 0–100)

## 2010-08-10 LAB — TYPE AND SCREEN
ABO/RH(D): A POS
Antibody Screen: NEGATIVE

## 2010-08-10 LAB — APTT: aPTT: 30 seconds (ref 24–37)

## 2010-08-10 LAB — HEMOGLOBIN A1C: Hgb A1c MFr Bld: 6.1 % (ref 4.6–6.1)

## 2010-09-19 NOTE — Assessment & Plan Note (Signed)
OFFICE VISIT   Brent Griffith, Brent Griffith  DOB:  06/29/1935                                        March 18, 2009  CHART #:  [id number]   REASON FOR OFFICE VISIT:  Check EVH site, right lower leg.   HISTORY OF PRESENT ILLNESS:  This is a pleasant 75 year old Caucasian  male who is status post CABG x4 by Dr. Dorris Griffith on March 07, 2009.  The patient was recently discharged from [facility] on March 14, 2009.  Overall, he has been making steady progress; however, he did  develop increased swelling and redness of the right lower extremity as  well as a blister proximal to the right chest tube site.  He presented  to [facility] for further evaluation and treatment.  He  was diagnosed with cellulitis of the right lower leg and given a  prescription for doxycycline 100 mg p.o. 2 times daily for 10 days.  In  addition, the patient had a right lower extremity venous duplex  ultrasound, this was negative for a DVT.  There were 3 fluid collections  identified, 2 the thigh and 1 in the calf, probable consistent with  seroma.  CBC also done revealed the white count to be within normal at  10,400, the H and H was 9.9 and 29.7 respectively.  The patient  presented to the office today for further follow up regarding the [place]  site of the right lower extremity.  The patient denies any chest pain,  shortness of breath, fever, or chills.   PHYSICAL EXAMINATION:  General:  This is a pleasant 75 year old  Caucasian male who is in no acute distress who is alert, oriented, and  cooperative and is accompanied by his wife.  Vital Signs:  BP 111/65,  heart rate 85, respirations 18, O2 sat 96%.  Cardiovascular:  Regular  rate and rhythm.  No murmurs, gallops, or rubs.  Pulmonary:  Slightly  decreased at the bases, otherwise clear.  No rales, wheezes, or rhonchi.  Abdomen:  Soft, nontender.  Bowel sounds present.   WOUNDS:  Sternal wound is clean and dry.   Proximal to the right chest  tube site is evidence of a former blister, very slight erythema around  the former chest tube site.  No drainage.  Right lower extremity wounds  are clean and dry.  Positive erythema distal to the second incision of  the right lower extremity.  Positive edema.  He also has ecchymosis of  the right thigh although this is somewhat less than postoperatively.  His right foot is swollen and bruised.  Left lower extremity, no  cyanosis, clubbing, and only trace edema.   IMPRESSION AND PLAN:  Cellulitis of the right lower extremity.  The  patient was instructed on the importance of continuing and finishing all  of his antibiotics.  He was also given a prescription for Lasix 40 mg  p.o. daily as well as KCl 20 mEq p.o. every other day.  These are to  continue to be taken until he is seen in the office in 1 week in follow  up.  The patient was instructed if he develops any increased erythema,  drainage, tenderness, or swelling he is to contact the office  immediately.   Brent Griffith, M.D.  Electronically Signed   DZ/MEDQ  D:  03/18/2009  T:  03/19/2009  Job:  [id number]   cc:   Madolyn Frieze. Jens Som, MD, [facility]

## 2010-09-19 NOTE — Assessment & Plan Note (Signed)
OFFICE VISIT   Brent Griffith, Brent Griffith  DOB:  [birth year]                                        March 25, 2009  CHART #:  [id number]   CURRENT PROBLEMS:  1. Status post CABG x4 for left main and three-vessel disease on      November 1 by Dr. Dorris Fetch.  2. Cellulitis of right leg at saphenous vein harvest site.  3. Hypertension.  4. Sleep apnea.   PRESENT ILLNESS:  The patient presents to the office concerned over  redness on his right leg.  He was last seen in the office by Dr.  Dorris Fetch and was found to have cellulitis of the right lower leg.  There was swelling, erythema, tenderness and an ultrasound of the leg  showed a fluid collection in the endo vein tunnel.  This was negative  for DVT.  White count was 10,000 and his hemoglobin was 9.9.  There is  no systemic symptoms of fever or chills.  He states since he was placed  on antibiotics 7 days ago, he has improved with less tenderness, less  swelling and less redness.  He also has been taking Lasix and potassium.   PHYSICAL EXAMINATION:  General:  He is afebrile.  Vital Signs:  Blood  pressure is 127/70, pulse 80 and regular, saturation 97% on room air.  Chest:  The sternal incision is healing well.  Cardiac:  Rhythm is  regular.  Extremities:  The right leg has some mild cellulitis from the  pretibial area to the ankle.  There is a small area of fluctuance, and  after this was prepped and anesthetized with 1% lidocaine, 8 mL of  nonclotting blood was removed from a liquefied hematoma.  A dressing was  applied.  The actual incision at saphenous vein harvest site has no  drainage or necrosis.   PLAN:  The patient will continue on oral antibiotics and we will  continue doxycycline 100 mg p.o. b.i.d. for the next 14 days and a new  prescription is provided.  The patient will return to keep his scheduled  appointment with Dr. Dorris Fetch in early December.   Brent Griffith, M.D.  Electronically  Signed   PV/MEDQ  D:  03/25/2009  T:  03/26/2009  Job:  [id number]

## 2010-09-19 NOTE — Assessment & Plan Note (Signed)
OFFICE VISIT   HOLTEN, SPANO  DOB:  [birth year]                                        April 07, 2009  CHART #:  [id number]   The patient is a 75 year old gentleman who was found back in October to  have left main and three-vessel coronary disease.  He underwent coronary  bypass grafting x4 on March 07, 2009.  His postoperative course  initially was uncomplicated.  He did present back with cellulitis and  hematoma in his right leg after the saphenous vein harvest that was  aspirated by Dr. Donata Clay and he was treated with doxycycline.  He is  now on day 13 of a 14-day course.  He states that there is still a  little bit of swelling in the right leg compared to the left, and still  some mild tenderness over the shin, but this has markedly improved from  2 weeks ago.  He has not had any chest pain or shortness of breath.  His  exercise tolerance is good.  He has been doing some work around the  house that his wife is very concerned.  He is also concerned about an  appointment next week for dental cleaning and some bridgework.   CURRENT MEDICATIONS:  1. Aspirin 325 mg daily.  2. Doxazosin 4 mg daily.  3. Iron 150 mg daily.  4. Rosuvastatin 20 mg daily.  5. Advair b.i.d.  6. Atenolol 50 mg daily.  7. Avodart 0.5 mg daily.  8. Prilosec 20 mg b.i.d.  9. Singulair 10 mg daily.  10.Xyzal 5 mg daily.  11.Doxycycline 100 mg b.i.d.   He is also finishing a course of Lasix and potassium.   PHYSICAL EXAMINATION:  General:  The patient is a well-appearing 75-year-  old gentleman, in no acute distress.  Vital Signs:  His blood pressure  is 114/69, pulse 76, respirations are 18, and his oxygen saturation is  97% on room air.  Lungs:  Clear with equal breath sounds bilaterally.  Cardiac:  Regular rate and rhythm.  Normal S1 and S2.  No rubs, murmurs,  or gallops.  Chest:  His sternum is well healed.  The sternum is stable.  Extremities:  His leg incisions are  well healed.  He does have some  slight discoloration on the shin on the right leg and this is still  slightly warm to the touch.  There is no calf tenderness or swelling in  the foot.   Chest x-ray shows good aeration of the lungs bilaterally.   IMPRESSION:  The patient is a 75 year old gentleman who is doing  extremely well at this point in time.  His exercise tolerance is good.  He was treated for infected hematoma, cellulitis of his right lower  extremity.  He is nearing the completion of his antibiotics which he  will finish tomorrow.  I will advise him to stop antibiotics at that  time and then see how this goes.  If it becomes more red or more tender  or more swollen, then he should give [place] a call immediately and we can  take a look at it and consider additional antibiotic therapy.  There is  nothing fluctuant or suggestive of fluid that would be an indication for  drainage.  The wounds themselves are intact.  He is not to lift  any  objects weighing greater than 10 pounds for another 2 weeks, other than  this his activities are unrestricted.  As far as getting dental work  done, I asked him to try and delay that for 2-3 weeks if possible just  to get beyond 6 weeks although I do not really know if there is any  scientific basis for that and I suspect he will be fine even if he is  not able to reschedule.   Salvatore Decent Dorris Fetch, M.D.  Electronically Signed   SCH/MEDQ  D:  04/07/2009  T:  04/07/2009  Job:  [id number]   cc:   Madolyn Frieze. Jens Som, MD, [facility]  Lenon Curt. Chilton Si, M.D.

## 2010-09-22 NOTE — Op Note (Signed)
   NAME:  MARCQUES, Brent Griffith                           ACCOUNT NO.:  \   MEDICAL RECORD NO.:  [id number]                   PATIENT TYPE:  AMB   LOCATION:  ENDO                                 FACILITY:  [facility]   PHYSICIAN:  Georgiana Spinner, M.D.                 DATE OF BIRTH:  25-Apr-1936   DATE OF PROCEDURE:  03/08/2003  DATE OF DISCHARGE:                                 OPERATIVE REPORT   PROCEDURE:  Colonoscopy.   INDICATIONS:  Hemoccult positivity, colon cancer screening.   ANESTHESIA:  None further given.   DESCRIPTION OF PROCEDURE:  With the patient mildly sedated and in the left  lateral decubitus position, the Olympus videoscopic colonoscope was inserted  in the rectum after normal rectal exam and passed under direct vision to the  cecum, identified by ileocecal valve and appendiceal orifice, both of which  were photographed.  From this point,  the colonoscope was slowly withdrawn,  taking circumferential views of the colonic mucosa stopping only in the  rectum which appeared normal on direct and showed hemorrhoids on retroflexed  view.  The endoscope was straightened and withdrawn.  The patient's vital  signs and pulse oximetry remained stable.  The patient tolerated the  procedure well without apparent complications.   FINDINGS:  Internal hemorrhoids; otherwise unremarkable colonoscopic  examination to the cecum.   PLAN:  See endoscopy note for further details.                                               Georgiana Spinner, M.D.    GMO/MEDQ  D:  03/08/2003  T:  03/08/2003  Job:  [id number]   cc:   Lenon Curt. Chilton Si, M.D.  [address]  [place]  [place] [address]  Fax: [phone]

## 2010-09-22 NOTE — Procedures (Signed)
NAME:  Brent Griffith, Brent Griffith NO.:  [id number]   MEDICAL RECORD NO.:  [id number]          PATIENT TYPE:  OUT   LOCATION:  SLEEP CENTER                 FACILITY:  [facility]   PHYSICIAN:  Clinton D. Maple Hudson, M.D. DATE OF BIRTH:  1935-09-26   DATE OF STUDY:  12/15/2004                              NOCTURNAL POLYSOMNOGRAM   REFERRING PHYSICIAN:  Dr. Osborn Coho   DATE OF STUDY:  December 15, 2004   INDICATION FOR STUDY:  Hypersomnia with sleep apnea. Epworth Sleepiness  Score 10/24, BMI 26, weight 170 pounds.   SLEEP ARCHITECTURE:  Total sleep time 384 minutes with sleep efficiency 95%.  Stage I was 8%, stage II 57%, stages III and IV 24%, REM 11% of total sleep  time. Sleep latency 1 minute, REM latency 143 minutes, awake after sleep  onset 18 minutes, arousal index increased at 36. No bedtime medication  taken.   RESPIRATORY DATA:  Respiratory disturbance index (RDI, AHI) 9.2 obstructive  events per hour indicating mild obstructive sleep apnea/hypopnea syndrome.  There were 3 central apneas 10 obstructive apneas and 46 hypopneas. Events  were not positional. REM RDI 28. He did not qualify for CPAP titration by  split protocol on this study night.   OXYGEN DATA:  Moderate to loud snoring with oxygen desaturation to a nadir  of 76%. Mean oxygen saturation through the study was 95% on room air.   CARDIAC DATA:  Normal sinus rhythm with occasional PVC.   MOVEMENT/PARASOMNIA:  A total of 411 limb jerks were reported of which 87  were associated with arousal or awakening for a periodic limb movement with  arousal index of 13.6 per hour which is increased.   IMPRESSION/RECOMMENDATION:  1.  Mild obstructive sleep apnea/hypopnea syndrome, apnea/hypopnea index 9.2      per hour with moderate snoring and oxygen desaturation to 76%.  2.  Consider return for continuous positive airway pressure titration or      evaluate for alternative therapies as appropriate.  3.  Periodic limb  movement with arousal, 13.6 per hour. This may be      sufficient to explain sleep complaints and may benefit from a trial of      specific therapy such as Klonopin or Requip if appropriate.      Clinton D. Maple Hudson, M.D.  Diplomate, Biomedical engineer of Sleep Medicine  Electronically Signed     CDY/MEDQ  D:  12/24/2004 12:25:09  T:  12/24/2004 19:02:11  Job:  [id number]

## 2010-09-22 NOTE — Assessment & Plan Note (Signed)
[facility]                             PULMONARY OFFICE NOTE   SHERLEY, LESER                        MRN:          [id number]  DATE:07/12/2006                            DOB:          12-25-1935    PROBLEM LIST:  1. Obstructive sleep apnea with hypersomnia.  2. Periodic limb movement with arousal.  3. Insufficient sleep.   HISTORY:  He and his wife return saying that they are still happy with  his success with auto titration CPAP which he has used over the past  year. He looks at it occasionally when he gets up in the night and  pressure generally ranges 8-10 CWP.  He is using a full face mask. He  had increased cough with a cold and he has had some seasonal rhinitis  complaints.  He sees Dr. [place] Callas for asthma and allergy management and  Dr. [place] Callas changed his fexofenadine to Singulair, Nasonex, Astelin and  Advair.   MEDICATIONS:  Optivar, Advair 100/50, Allegra 180 mg, Omega-3,  glucosamine, folic acid, omeprazole, Atenolol 50 mg, Avodart, doxazosin,  aspirin 81 mg, CPAP auto-titration, Nasonex, Singulair.   ALLERGIES:  No medication allergy.   OBJECTIVE:  Weight 162 pounds, blood pressure 122/70, pulse regular, 59,  room air saturation 98%.  He is alert, somewhat overweight. There is  moderate turbinate edema and some nasal stuffiness. Chest sounds clear.  Pulses regular.   IMPRESSION:  Obstructive sleep apnea. Doing very well on CPAP. I have  emphasized again the treatment options and the importance of keeping his  weight down. I congratulated him for having lost some weight in the past  year. He will continue CPAP with his auto-titration machine and schedule  return in a year, earlier p.r.n.     Clinton D. Maple Hudson, MD, Tonny Bollman, FACP  Electronically Signed    CDY/MedQ  DD: 07/13/2006  DT: 07/13/2006  Job #: [id number]   cc:   Lenon Curt. Chilton Si, M.D.  Kinnie Scales. Annalee Genta, M.D.  Courtney Paris, M.D.

## 2010-09-22 NOTE — Op Note (Signed)
   NAME:  Brent Griffith, Brent Griffith                           ACCOUNT NO.:  [id number]   MEDICAL RECORD NO.:  [id number]                   PATIENT TYPE:  AMB   LOCATION:  ENDO                                 FACILITY:  [facility]   PHYSICIAN:  Georgiana Spinner, M.D.                 DATE OF BIRTH:  Feb 01, 1936   DATE OF PROCEDURE:  DATE OF DISCHARGE:                                 OPERATIVE REPORT   PROCEDURE:  Upper endoscopy.   INDICATION:  Abdominal pain.   ANESTHESIA:  Demerol 40 mg, Versed 6 mg.   DESCRIPTION OF PROCEDURE:  With the patient mildly sedated in the left  lateral decubitus position, the Olympus videoscopic endoscope was inserted  into the mouth and passed under direct vision through the esophagus which  appeared normal until it reached the distal esophagus and there were changes  of what appeared to be Barrett's esophagus, photographed and biopsied.  We  then entered into the stomach and the fundus, body, antrum, duodenal bulb,  and second portion of the duodenum were visualized.  From this point, the  endoscope was slowly withdrawn, taking circumferential views of the duodenal  mucosa until the endoscope was then pulled into the stomach and placed in  retroflexion, viewing the stomach from below, the endoscope was then  straightened and withdrawn, taking circumferential views of the remaining  gastric and esophageal mucosa, stopping in the fundus of the stomach where  some slightly thickened folds were seen, photographed and biopsied.  The  patient's vital signs and pulse oximetry remained stable and the patient  tolerated the procedure well without apparent complications.   FINDINGS:  1. Question of Barrett's esophagus, biopsied.  2. Mildly thickened gastric folds, biopsied.   PLAN:  Await biopsy report, the patient will call me for results and follow  up with me as an outpatient.                                               Georgiana Spinner, M.D.    GMO/MEDQ  D:  03/08/2003   T:  03/08/2003  Job:  [id number]   cc:   Lenon Curt. Chilton Si, M.D.  [address]  [place]  [place] [address]  Fax: [phone]

## 2010-10-18 IMAGING — CR DG CHEST 2V
2 series · 2 of 2 positions shown · non-contrast
Comparison: 01/18/2009

CLINICAL DATA: Preop CABG procedure

CHEST - 2 VIEW

[w chest pa]
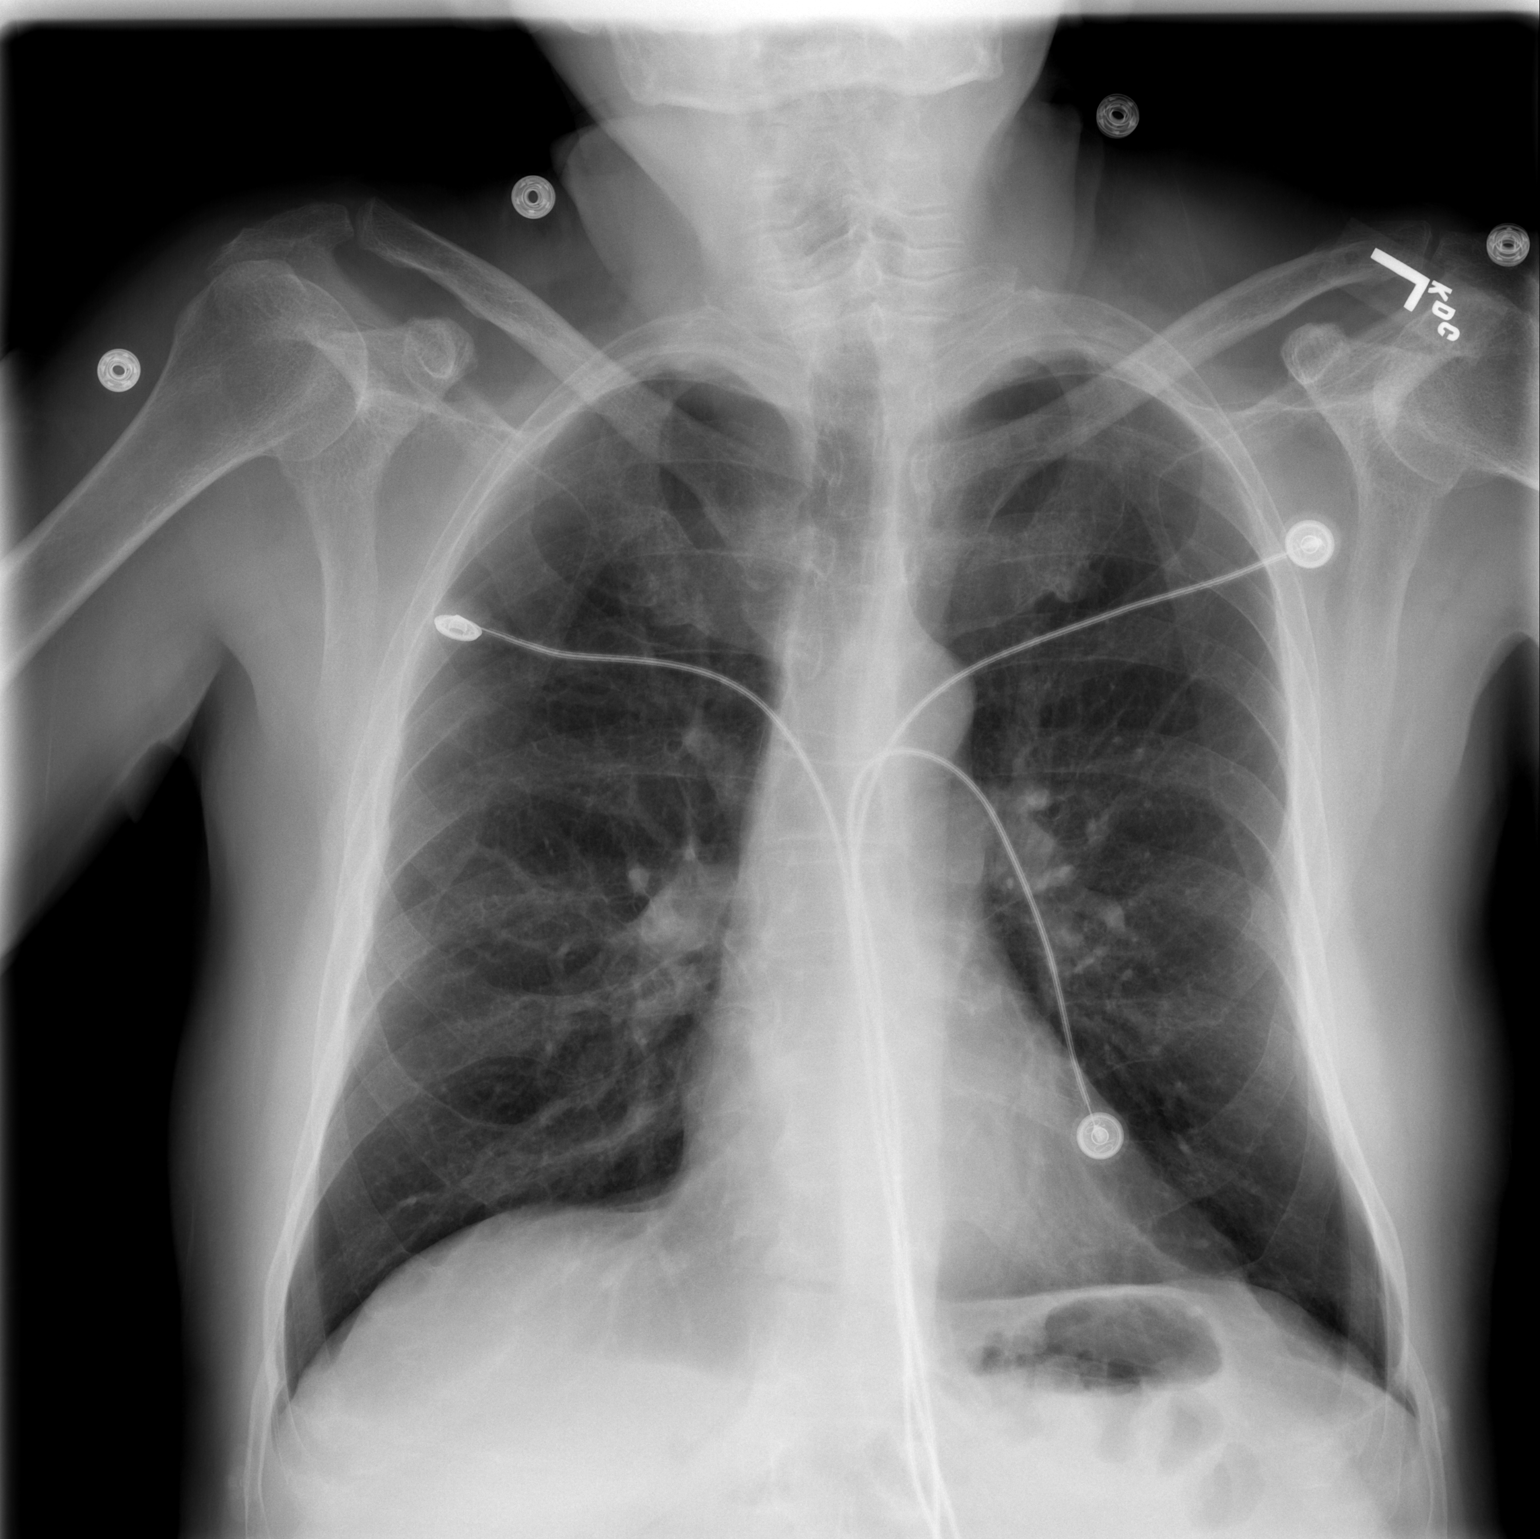

[w chest lat]
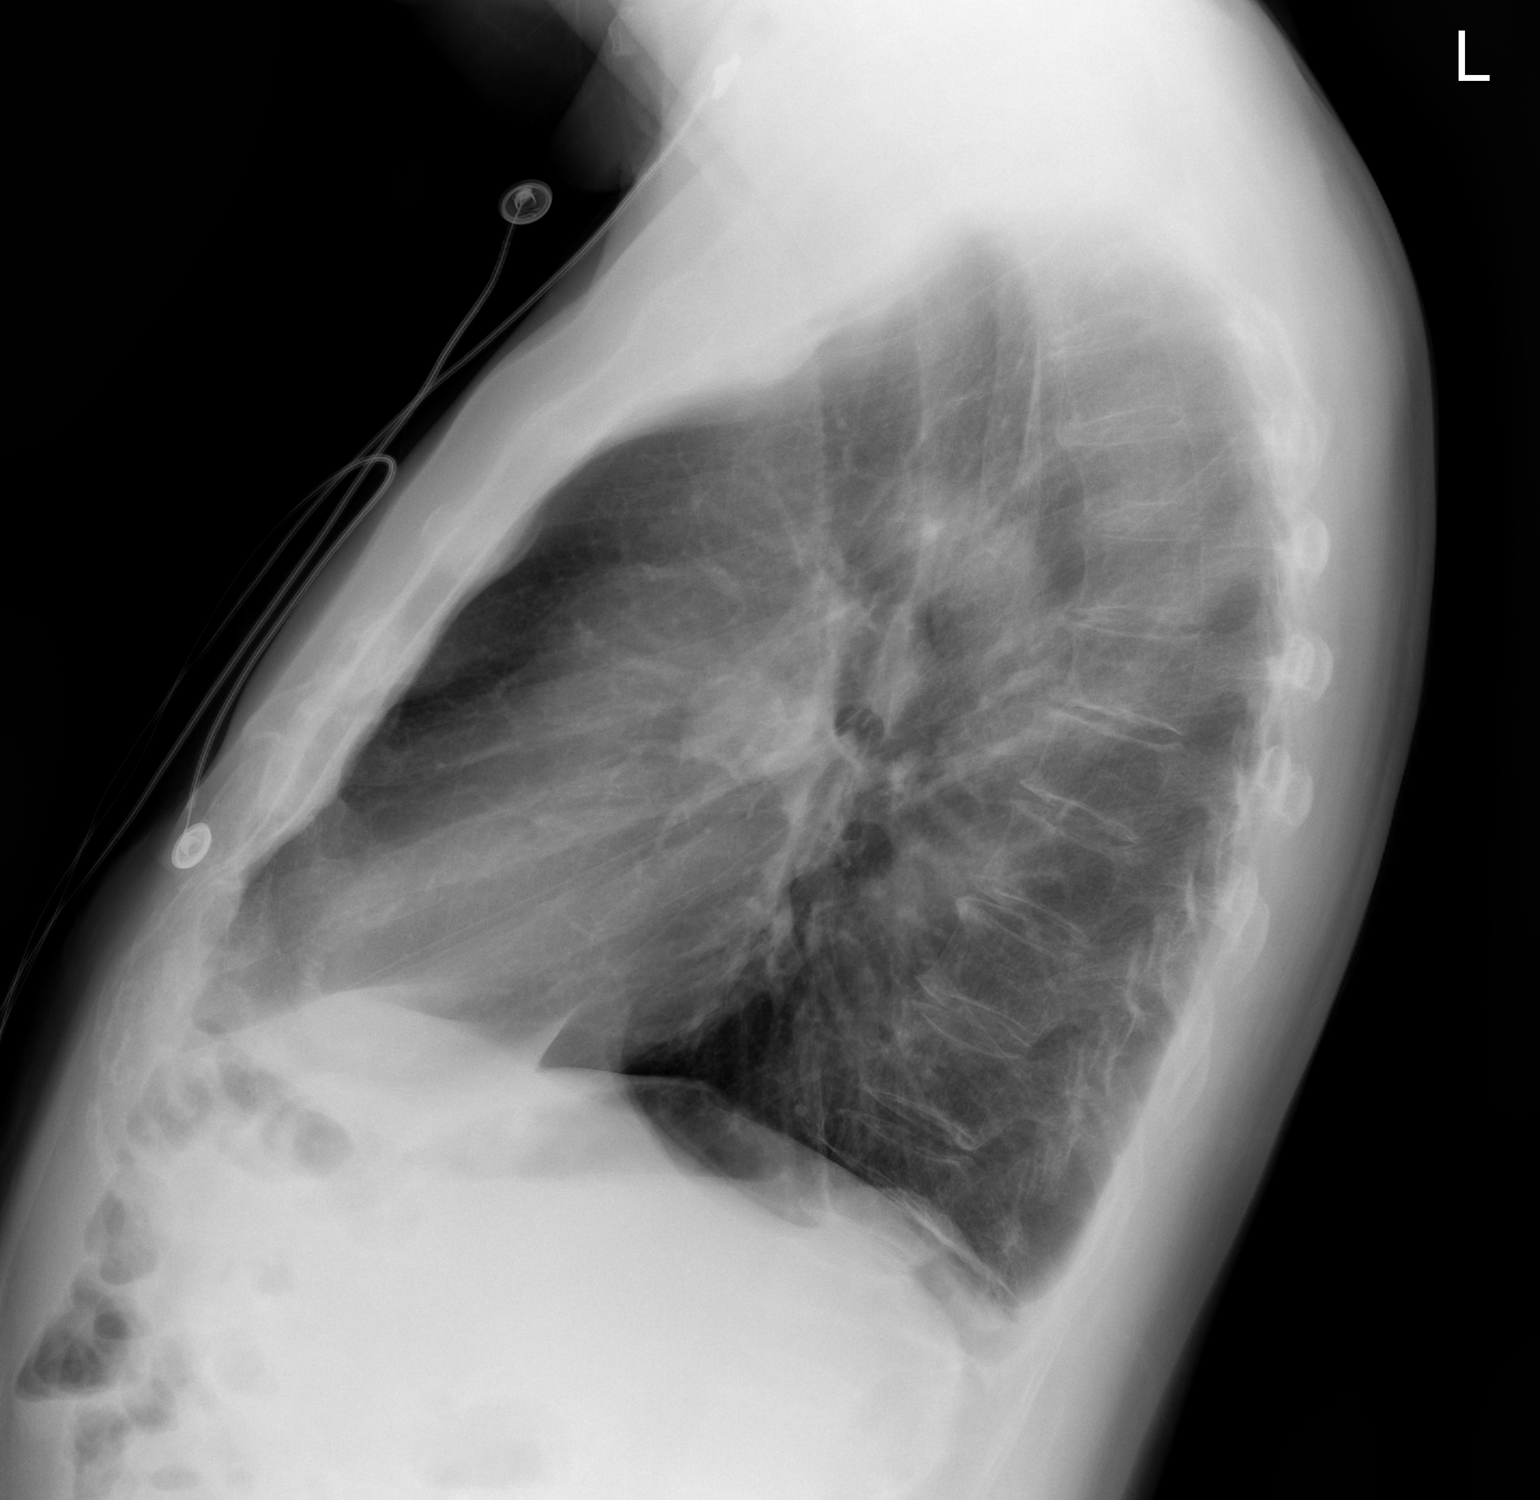

[2 of 2 positions shown; findings below may reference images not displayed]

FINDINGS: Heart size and mediastinal contours are normal.

No pleural effusions or pulmonary edema.

No airspace consolidation identified.
IMPRESSION: 1.  No evidence for heart failure.

## 2010-10-22 ENCOUNTER — Emergency Department (INDEPENDENT_AMBULATORY_CARE_PROVIDER_SITE_OTHER): Payer: BC Managed Care – PPO

## 2010-10-22 ENCOUNTER — Emergency Department (HOSPITAL_BASED_OUTPATIENT_CLINIC_OR_DEPARTMENT_OTHER)
Admission: EM | Admit: 2010-10-22 | Discharge: 2010-10-22 | Disposition: A | Discharge status: Home / Self Care | Payer: BC Managed Care – PPO | Attending: Emergency Medicine | Admitting: Emergency Medicine

## 2010-10-22 DIAGNOSIS — E7800 Pure hypercholesterolemia, unspecified: Secondary | ICD-10-CM | POA: Insufficient documentation

## 2010-10-22 DIAGNOSIS — I10 Essential (primary) hypertension: Secondary | ICD-10-CM | POA: Insufficient documentation

## 2010-10-22 DIAGNOSIS — M549 Dorsalgia, unspecified: Secondary | ICD-10-CM | POA: Insufficient documentation

## 2010-10-22 DIAGNOSIS — Z79899 Other long term (current) drug therapy: Secondary | ICD-10-CM | POA: Insufficient documentation

## 2010-10-22 DIAGNOSIS — I2510 Atherosclerotic heart disease of native coronary artery without angina pectoris: Secondary | ICD-10-CM | POA: Insufficient documentation

## 2010-10-22 DIAGNOSIS — K219 Gastro-esophageal reflux disease without esophagitis: Secondary | ICD-10-CM | POA: Insufficient documentation

## 2010-10-22 DIAGNOSIS — M810 Age-related osteoporosis without current pathological fracture: Secondary | ICD-10-CM | POA: Insufficient documentation

## 2010-10-22 LAB — URINALYSIS, ROUTINE W REFLEX MICROSCOPIC
Glucose, UA: NEGATIVE mg/dL
Ketones, ur: NEGATIVE mg/dL
Leukocytes, UA: NEGATIVE
Nitrite: NEGATIVE
Specific Gravity, Urine: 1.018 (ref 1.005–1.030)
pH: 6.5 (ref 5.0–8.0)

## 2010-10-22 IMAGING — CR DG CHEST 2V
2 series · 2 of 2 positions shown · non-contrast
Comparison: 03/08/2009

CLINICAL DATA: CHEST - 2 VIEW

[w chest pa]
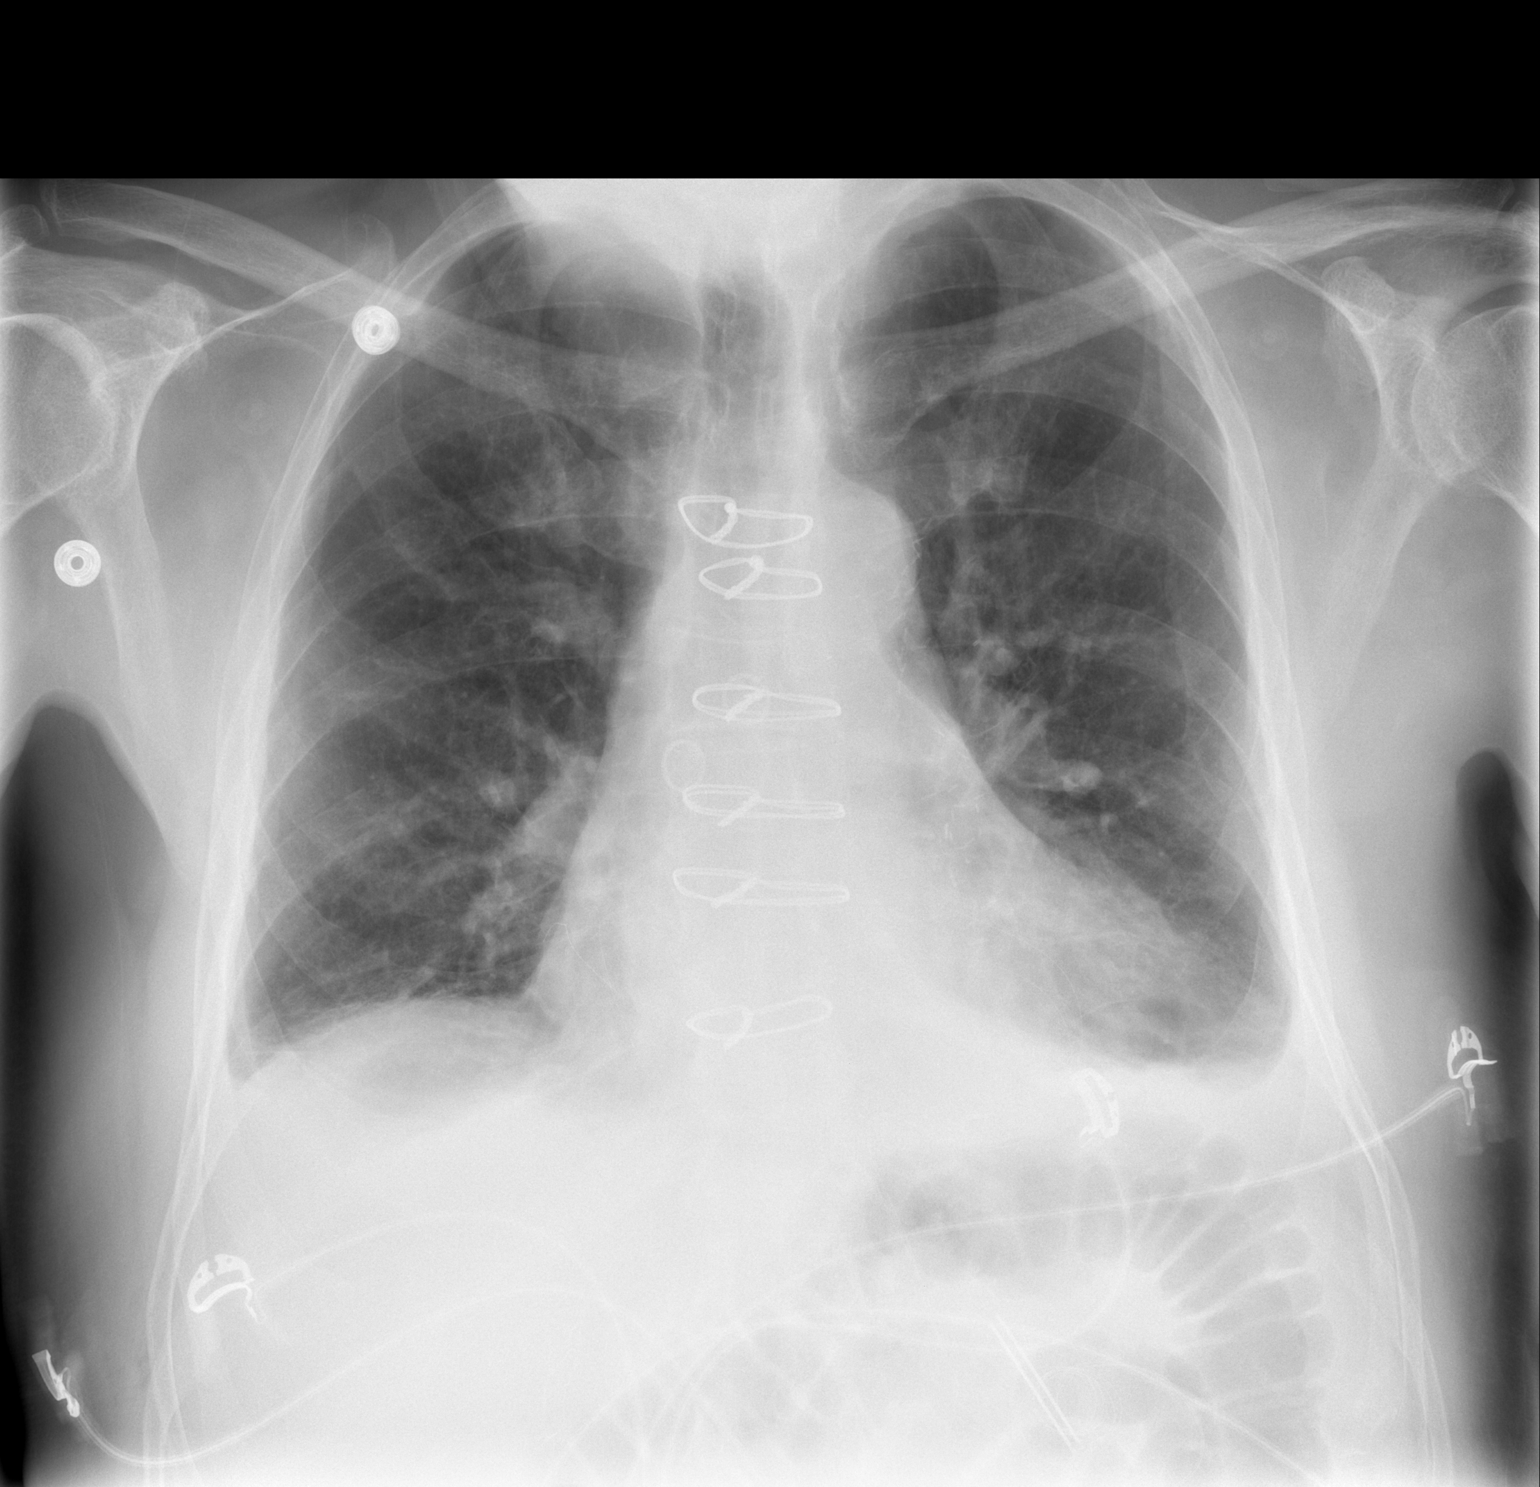

[w chest lat]
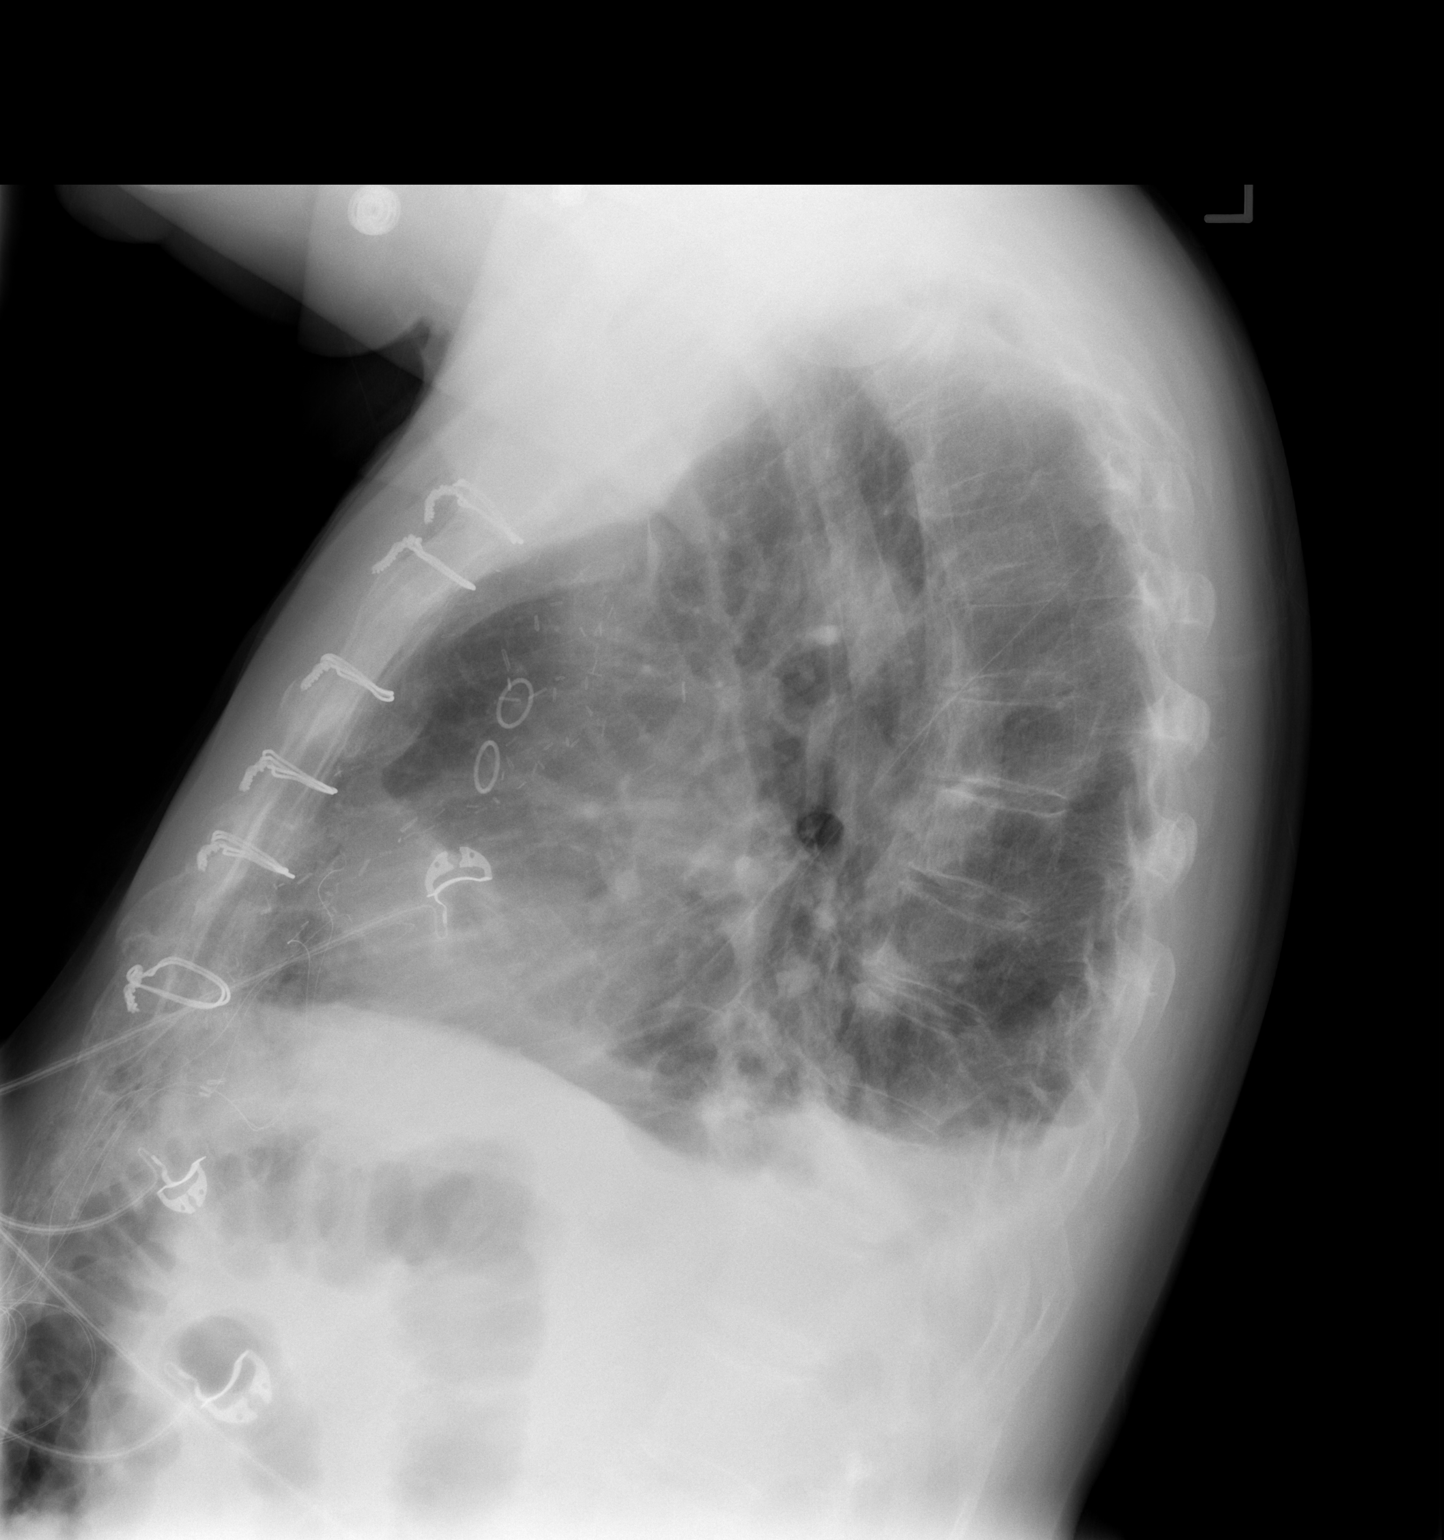

[2 of 2 positions shown; findings below may reference images not displayed]

FINDINGS: The heart size appears enlarged.

There are bilateral pleural effusions.  When compared with the
prior exam these are increased in volume.

Atelectasis is noted within the lung bases.

No pneumothorax is identified.  There  are no complications after
removal of support apparatus.
IMPRESSION: 1.  Increase in pleural effusions.

## 2010-11-10 ENCOUNTER — Ambulatory Visit: Payer: Self-pay | Admitting: Internal Medicine

## 2010-12-08 ENCOUNTER — Ambulatory Visit (INDEPENDENT_AMBULATORY_CARE_PROVIDER_SITE_OTHER): Payer: BC Managed Care – PPO | Admitting: Internal Medicine

## 2010-12-08 ENCOUNTER — Encounter: Payer: Self-pay | Admitting: Internal Medicine

## 2010-12-08 VITALS — BP 122/66 | HR 65 | Ht 64.0 in | Wt 154.6 lb

## 2010-12-08 DIAGNOSIS — G4710 Hypersomnia, unspecified: Secondary | ICD-10-CM

## 2010-12-08 DIAGNOSIS — G4730 Sleep apnea, unspecified: Secondary | ICD-10-CM

## 2010-12-08 NOTE — Patient Instructions (Signed)
Keep up the good work with your CPAP. Please call as needed

## 2010-12-08 NOTE — Assessment & Plan Note (Addendum)
Good compliance and control on long term AutoPAP. We discussed options Brent Griffith continue with current settings. He is satisfied with his mask but understands changes are available and that he can talk with [facility] as needed.

## 2010-12-08 NOTE — Progress Notes (Signed)
Subjective:    Patient ID: Brent Griffith, male    DOB: [birth year], 75 y.o.   MRN: [id number]  HPI 12/08/10- 75 year old male never smoker, followed for obstructive sleep apnea, periodic limb movement, CAD/ CABG // Dr [place] Callas for allergic asthma Last here- November 10, 2009- note reviewed CPAP comfortable, used on trips and all night every night-AutoPAP [facility], full face mask. Wife likes it because it prevents snoring. Feels well rested. Usual bedtime and up around 5- he accepts responsibility to get enough sleep. Denies EDS driving or etc. No needs for changes. He gets supplies ok. We discussed mask options. Heart surgery Nov 2010 CABG 4v Review of Systems Constitutional:   No-   weight loss, night sweats, fevers, chills, fatigue, lassitude. HEENT:   No-   headaches, difficulty swallowing, tooth/dental problems, sore throat,                  No-   sneezing, itching, ear ache, nasal congestion, post nasal drip,  CV:  No-   chest pain, orthopnea, PND, swelling in lower extremities, anasarca,dizziness, palpitations GI:  No-   heartburn, indigestion, abdominal pain, nausea, vomiting, diarrhea,                 change in bowel habits, loss of appetite Resp: No-   shortness of breath with exertion or at rest.  No-  excess mucus,             No-   productive cough,  No non-productive cough,  No-  coughing up of blood.              No-   change in color of mucus.  No- wheezing.   Skin: No-   rash or lesions. GU: No-   dysuria, change in color of urine, no urgency or frequency.  No- flank pain. MS:  No-   joint pain or swelling.  No- decreased range of motion.  No- back pain. Psych:  No- change in mood or affect. No depression or anxiety.  No memory loss.      Objective:   Physical Exam General- Alert, Oriented, Affect-appropriate, Distress- none acute. Medium build. Skin- rash-none, lesions- none, excoriation- none Lymphadenopathy- none Head- atraumatic            Eyes- Gross vision intact,  PERRLA, conjunctivae clear secretions            Ears- Hearing, canals normal            Nose- Clear, +Septal dev/ external nasal deviation,  No-mucus, polyps, erosion, perforation             Throat- Mallampati II , mucosa clear , drainage- none, tonsils- atrophic Neck- flexible , trachea midline, no stridor , thyroid nl, carotid no bruit Chest - symmetrical excursion , unlabored           Heart/CV- RRR , no murmur , no gallop  , no rub, nl s1 s2                           - JVD- none , edema- none, stasis changes- none, varices- none           Lung- clear to P&A, wheeze- none, cough- none , dullness-none, rub- none           Chest wall-  Abd- tender-no, distended-no, bowel sounds-present, HSM- no Br/ Gen/ Rectal- Not done, not indicated Extrem- cyanosis- none, clubbing, none, atrophy- none,  strength- nl Neuro- grossly intact to observation         Assessment & Plan:

## 2010-12-11 ENCOUNTER — Emergency Department (HOSPITAL_BASED_OUTPATIENT_CLINIC_OR_DEPARTMENT_OTHER)
Admission: EM | Admit: 2010-12-11 | Discharge: 2010-12-11 | Disposition: A | Discharge status: Home / Self Care | Payer: BC Managed Care – PPO | Attending: Emergency Medicine | Admitting: Emergency Medicine

## 2010-12-11 ENCOUNTER — Encounter (HOSPITAL_BASED_OUTPATIENT_CLINIC_OR_DEPARTMENT_OTHER): Payer: Self-pay | Admitting: *Deleted

## 2010-12-11 DIAGNOSIS — M25559 Pain in unspecified hip: Secondary | ICD-10-CM

## 2010-12-11 DIAGNOSIS — G4730 Sleep apnea, unspecified: Secondary | ICD-10-CM | POA: Insufficient documentation

## 2010-12-11 DIAGNOSIS — K219 Gastro-esophageal reflux disease without esophagitis: Secondary | ICD-10-CM | POA: Insufficient documentation

## 2010-12-11 DIAGNOSIS — I10 Essential (primary) hypertension: Secondary | ICD-10-CM | POA: Insufficient documentation

## 2010-12-11 DIAGNOSIS — I2510 Atherosclerotic heart disease of native coronary artery without angina pectoris: Secondary | ICD-10-CM | POA: Insufficient documentation

## 2010-12-11 DIAGNOSIS — J45909 Unspecified asthma, uncomplicated: Secondary | ICD-10-CM | POA: Insufficient documentation

## 2010-12-11 DIAGNOSIS — Z79899 Other long term (current) drug therapy: Secondary | ICD-10-CM | POA: Insufficient documentation

## 2010-12-11 HISTORY — DX: Unspecified kyphosis, site unspecified: M40.209

## 2010-12-11 HISTORY — DX: Benign prostatic hyperplasia without lower urinary tract symptoms: N40.0

## 2010-12-11 LAB — CBC
HCT: 35.5 % — ABNORMAL LOW (ref 39.0–52.0)
MCHC: 34.9 g/dL (ref 30.0–36.0)
MCV: 84.7 fL (ref 78.0–100.0)
Platelets: 116 10*3/uL — ABNORMAL LOW (ref 150–400)
RDW: 12.4 % (ref 11.5–15.5)

## 2010-12-11 LAB — URINALYSIS, ROUTINE W REFLEX MICROSCOPIC
Leukocytes, UA: NEGATIVE
Nitrite: NEGATIVE
Specific Gravity, Urine: 1.023 (ref 1.005–1.030)
pH: 8 (ref 5.0–8.0)

## 2010-12-11 LAB — DIFFERENTIAL
Basophils Absolute: 0 10*3/uL (ref 0.0–0.1)
Basophils Relative: 0 % (ref 0–1)
Eosinophils Relative: 1 % (ref 0–5)
Monocytes Absolute: 0.7 10*3/uL (ref 0.1–1.0)
Neutro Abs: 2.8 10*3/uL (ref 1.7–7.7)

## 2010-12-11 LAB — COMPREHENSIVE METABOLIC PANEL
BUN: 12 mg/dL (ref 6–23)
Calcium: 9.5 mg/dL (ref 8.4–10.5)
GFR calc Af Amer: >60 mL/min (ref >60)
Glucose, Bld: 113 mg/dL — ABNORMAL HIGH (ref 70–99)
Total Protein: 6.7 g/dL (ref 6.0–8.3)

## 2010-12-11 LAB — CK: Total CK: 46 U/L (ref 7–232)

## 2010-12-11 MED ORDER — OXYCODONE-ACETAMINOPHEN 5-325 MG PO TABS
1.0000 | ORAL_TABLET | Freq: Once | ORAL | Status: AC
  Administered 2010-12-11: 1 via ORAL
  Filled 2010-12-11: qty 1

## 2010-12-11 MED ORDER — OXYCODONE-ACETAMINOPHEN 5-325 MG PO TABS: 1.0000 | ORAL_TABLET | ORAL | Status: AC | PRN

## 2010-12-11 NOTE — ED Notes (Signed)
the patient states he woke up this morning to go to the bathroom. the patient experience a sudden onset of soreness in bilateral thighs. No swelling noted. the patient denies any pain on palpation. the patient states lying down and on his side makes his legs hurt worse. the patient states pain to be a 9/10. the patient states he returned from vacation last week that involved a lot of walking up stairs. the patient is no apparent distress

## 2010-12-11 NOTE — ED Provider Notes (Signed)
History     CSN: [id number] Arrival date & time: 12/11/2010  9:40 AM  Chief Complaint  Patient presents with  . Leg Pain    bilateral thighs   Patient is a 75 y.o. male presenting with leg pain. The history is provided by the patient.  Leg Pain  The incident occurred 6 to 12 hours ago. The incident occurred at home. There was no injury mechanism (He had done a lot of walking and stair climbing two weeks ago). The pain is present in the left thigh and right thigh (Pain is only in the anterior thigh). The pain is at a severity of 9/10. The pain is severe. The pain has been constant since onset. Pertinent negatives include no numbness. The symptoms are aggravated by nothing. He has tried nothing for the symptoms.    Past Medical History  Diagnosis Date  . Unspecified essential hypertension   . Esophageal reflux   . Other extrapyramidal disease and abnormal movement disorder   . Hypersomnia with sleep apnea, unspecified   . Unspecified sleep apnea   . Asthma   . Hyperlipidemia   . CAD (coronary artery disease)   . Enlarged prostate   . Kyphosis     Past Surgical History  Procedure Date  . Coronary artery bypass graft   . Tonsillectomy   . Shoulder surgery     left    Family History  Problem Relation Age of Onset  . Stroke Mother   . Hypertension Father   . Stroke Father     History  Substance Use Topics  . Smoking status: Never Smoker   . Smokeless tobacco: Not on file  . Alcohol Use: Yes      Review of Systems  Neurological: Negative for numbness.  All other systems reviewed and are negative.    Physical Exam  BP 137/57  Pulse 92  Temp(Src) 99.4 F (37.4 C) (Oral)  Resp 20  SpO2 96%  Physical Exam  Nursing note and vitals reviewed. Constitutional: He is oriented to person, place, and time.  HENT:  Head: Normocephalic and atraumatic.  Right Ear: External ear normal.  Left Ear: External ear normal.  Nose: Nose normal.  Mouth/Throat: Oropharynx is  clear and moist.  Eyes: Conjunctivae and EOM are normal. Pupils are equal, round, and reactive to light. No scleral icterus.  Neck: Normal range of motion. Neck supple. No JVD present.  Cardiovascular: Normal rate, regular rhythm and normal heart sounds.   No murmur heard. Pulmonary/Chest: Effort normal and breath sounds normal. He has no wheezes. He has no rales. He exhibits no tenderness.  Abdominal: Soft. Bowel sounds are normal. He exhibits no mass. There is no tenderness.  Musculoskeletal: Normal range of motion. He exhibits no edema and no tenderness.       Strong distal pulses, prompt capillary refill. Mild pain with passive ROM.  Lymphadenopathy:    He has no cervical adenopathy.  Neurological: He is alert and oriented to person, place, and time. No cranial nerve deficit. Coordination normal.  Skin: Skin is warm and dry. No rash noted.  Psychiatric: He has a normal mood and affect.    ED Course  Procedures  MDM Need to evaluate for possible myositis.  CPK is normal. Pain is much improved after Percocet. No evidence of any muscle injury or breakdown.  Labs reviewed.  Results for orders placed during the hospital encounter of 12/11/10  CBC      Component Value Range   WBC 3.7 (*)  4.0 - 10.5 (K/uL)   RBC 4.19 (*) 4.22 - 5.81 (MIL/uL)   Hemoglobin 12.4 (*) 13.0 - 17.0 (g/dL)   HCT 62.1 (*) 30.8 - 52.0 (%)   MCV 84.7  78.0 - 100.0 (fL)   MCH 29.6  26.0 - 34.0 (pg)   MCHC 34.9  30.0 - 36.0 (g/dL)   RDW 65.7  84.6 - 96.2 (%)   Platelets 116 (*) 150 - 400 (K/uL)  DIFFERENTIAL      Component Value Range   Neutrophils Relative 76  43 - 77 (%)   Neutro Abs 2.8  1.7 - 7.7 (K/uL)   Lymphocytes Relative 5 (*) 12 - 46 (%)   Lymphs Abs 0.2 (*) 0.7 - 4.0 (K/uL)   Monocytes Relative 18 (*) 3 - 12 (%)   Monocytes Absolute 0.7  0.1 - 1.0 (K/uL)   Eosinophils Relative 1  0 - 5 (%)   Eosinophils Absolute 0.0  0.0 - 0.7 (K/uL)   Basophils Relative 0  0 - 1 (%)   Basophils Absolute 0.0   0.0 - 0.1 (K/uL)   WBC Morphology WHITE COUNT CONFIRMED ON SMEAR     Smear Review SPECIMEN CHECKED FOR CLOTS    COMPREHENSIVE METABOLIC PANEL      Component Value Range   Sodium 135  135 - 145 (mEq/L)   Potassium 4.2  3.5 - 5.1 (mEq/L)   Chloride 102  96 - 112 (mEq/L)   CO2 24  19 - 32 (mEq/L)   Glucose, Bld 113 (*) 70 - 99 (mg/dL)   BUN 12  6 - 23 (mg/dL)   Creatinine, Ser 9.52  0.50 - 1.35 (mg/dL)   Calcium 9.5  8.4 - 84.1 (mg/dL)   Total Protein 6.7  6.0 - 8.3 (g/dL)   Albumin 3.8  3.5 - 5.2 (g/dL)   AST 40 (*) 0 - 37 (U/L)   ALT 30  0 - 53 (U/L)   Alkaline Phosphatase 67  39 - 117 (U/L)   Total Bilirubin 1.7 (*) 0.3 - 1.2 (mg/dL)   GFR calc non Af Amer >60  >60 (mL/min)   GFR calc Af Amer >60  >60 (mL/min)  URINALYSIS, ROUTINE W REFLEX MICROSCOPIC      Component Value Range   Color, Urine YELLOW  YELLOW    Appearance CLEAR  CLEAR    Specific Gravity, Urine 1.023  1.005 - 1.030    pH 8.0  5.0 - 8.0    Glucose, UA NEGATIVE  NEGATIVE (mg/dL)   Hgb urine dipstick NEGATIVE  NEGATIVE    Bilirubin Urine NEGATIVE  NEGATIVE    Ketones, ur 15 (*) NEGATIVE (mg/dL)   Protein, ur NEGATIVE  NEGATIVE (mg/dL)   Urobilinogen, UA 0.2  0.0 - 1.0 (mg/dL)   Nitrite NEGATIVE  NEGATIVE    Leukocytes, UA NEGATIVE  NEGATIVE   CK      Component Value Range   Total CK 46  7 - 232 (U/L)   No results found.        Dione Booze, MD 12/11/10 [phone]

## 2010-12-11 NOTE — ED Notes (Signed)
Family at bedside. 

## 2011-05-13 ENCOUNTER — Encounter (HOSPITAL_BASED_OUTPATIENT_CLINIC_OR_DEPARTMENT_OTHER): Payer: Self-pay | Admitting: *Deleted

## 2011-05-13 ENCOUNTER — Emergency Department (HOSPITAL_BASED_OUTPATIENT_CLINIC_OR_DEPARTMENT_OTHER)
Admission: EM | Admit: 2011-05-13 | Discharge: 2011-05-13 | Disposition: A | Discharge status: Home / Self Care | Payer: BC Managed Care – PPO | Attending: Emergency Medicine | Admitting: Emergency Medicine

## 2011-05-13 DIAGNOSIS — E785 Hyperlipidemia, unspecified: Secondary | ICD-10-CM | POA: Insufficient documentation

## 2011-05-13 DIAGNOSIS — I2510 Atherosclerotic heart disease of native coronary artery without angina pectoris: Secondary | ICD-10-CM | POA: Insufficient documentation

## 2011-05-13 DIAGNOSIS — R509 Fever, unspecified: Secondary | ICD-10-CM | POA: Insufficient documentation

## 2011-05-13 DIAGNOSIS — K219 Gastro-esophageal reflux disease without esophagitis: Secondary | ICD-10-CM | POA: Insufficient documentation

## 2011-05-13 DIAGNOSIS — J111 Influenza due to unidentified influenza virus with other respiratory manifestations: Secondary | ICD-10-CM | POA: Insufficient documentation

## 2011-05-13 DIAGNOSIS — G4730 Sleep apnea, unspecified: Secondary | ICD-10-CM | POA: Insufficient documentation

## 2011-05-13 DIAGNOSIS — Z79899 Other long term (current) drug therapy: Secondary | ICD-10-CM | POA: Insufficient documentation

## 2011-05-13 DIAGNOSIS — J45909 Unspecified asthma, uncomplicated: Secondary | ICD-10-CM | POA: Insufficient documentation

## 2011-05-13 DIAGNOSIS — I10 Essential (primary) hypertension: Secondary | ICD-10-CM | POA: Insufficient documentation

## 2011-05-13 MED ORDER — OSELTAMIVIR PHOSPHATE 75 MG PO CAPS: 75.0000 mg | ORAL_CAPSULE | Freq: Two times a day (BID) | ORAL | Status: AC

## 2011-05-13 MED ORDER — HYDROCODONE-ACETAMINOPHEN 5-325 MG PO TABS: 2.0000 | ORAL_TABLET | ORAL | Status: AC | PRN

## 2011-05-13 NOTE — ED Notes (Signed)
Pt c/o fever (100.6), body aches onset this a.m.

## 2011-05-13 NOTE — ED Provider Notes (Signed)
History   This chart was scribed for Felisa Bonier, MD by Charolett Bumpers . The patient was seen in room MH02/MH02 and the patient's care was started at 4:50pm.  CSN: [id number]  Arrival date & time 05/13/11  1530   First MD Initiated Contact with Patient 05/13/11 1613      Chief Complaint  Patient presents with  . Fever    (Consider location/radiation/quality/duration/timing/severity/associated sxs/prior treatment) HPI Brent Griffith is a 76 y.o. male who presents to the Emergency Department complaining of a intermittent, moderate fever (100.6) since this morning with associated body aches, non-productive cough, and diarrhea X1 last night. Patient denies nasal congestion, nausea, and vomiting. Patient reports not taking anything for his fever. Patient states he has had 2 sick contacts.    Past Medical History  Diagnosis Date  . Unspecified essential hypertension   . Esophageal reflux   . Other extrapyramidal disease and abnormal movement disorder   . Hypersomnia with sleep apnea, unspecified   . Unspecified sleep apnea   . Asthma   . Hyperlipidemia   . CAD (coronary artery disease)   . Enlarged prostate   . Kyphosis     Past Surgical History  Procedure Date  . Coronary artery bypass graft   . Tonsillectomy   . Shoulder surgery     left    Family History  Problem Relation Age of Onset  . Stroke Mother   . Hypertension Father   . Stroke Father     History  Substance Use Topics  . Smoking status: Never Smoker   . Smokeless tobacco: Not on file  . Alcohol Use: Yes      Review of Systems A complete 10 system review of systems was obtained and is otherwise negative except as noted in the HPI and PMH.   Allergies  Review of patient's allergies indicates no known allergies.  Home Medications   Current Outpatient Rx  Name Route Sig Dispense Refill  . ASPIRIN 325 MG PO TBEC Oral Take 325 mg by mouth daily.      . ATENOLOL 50 MG PO TABS Oral Take 50  mg by mouth daily.      . AZELASTINE HCL 0.05 % OP SOLN  1 drop 2 (two) times daily. Use as directed.     Marland Kitchen CALCIUM CITRATE 250 MG PO TABS Oral Take 1 tablet by mouth daily.     Marland Kitchen HYDROCOD POLST-CPM POLST ER 10-8 MG/5ML PO LQCR Oral Take 5 mLs by mouth every 12 (twelve) hours. For cough    . VITAMIN D 1000 UNITS PO TABS Oral Take 1,000 Units by mouth daily.      . COQ10 100 MG PO CAPS Oral Take by mouth daily.      Marland Kitchen DOXAZOSIN MESYLATE 4 MG PO TABS Oral Take 4 mg by mouth at bedtime.      . DUTASTERIDE 0.5 MG PO CAPS Oral Take 0.5 mg by mouth daily.      Marland Kitchen GLUCOSAMINE 500 MG PO CAPS Oral Take 1,000 mg by mouth daily.     Marland Kitchen LEVOCETIRIZINE DIHYDROCHLORIDE 5 MG PO TABS Oral Take 5 mg by mouth every evening.      Marland Kitchen MONTELUKAST SODIUM 10 MG PO TABS Oral Take 10 mg by mouth at bedtime.      Marland Kitchen ONE-DAILY MULTI VITAMINS PO TABS Oral Take 1 tablet by mouth daily.      Marland Kitchen OMEPRAZOLE 40 MG PO CPDR Oral Take 40 mg by mouth daily.      Marland Kitchen  ROSUVASTATIN CALCIUM 20 MG PO TABS Oral Take 10 mg by mouth daily.     . TERIPARATIDE (RECOMBINANT) 600 MCG/2.4ML [place] SOLN Subcutaneous Inject 600 mcg into the skin daily. Study Medication    . FLUTICASONE-SALMETEROL 100-50 MCG/DOSE IN AEPB Inhalation Inhale 1 puff into the lungs every 12 (twelve) hours.      Marland Kitchen METHOCARBAMOL 750 MG PO TABS Oral Take 750 mg by mouth as needed. For muscle spasms    . PROAIR HFA 108 (90 BASE) MCG/ACT IN AERS Oral Take 2 puffs by mouth every 6 (six) hours as needed. For shortness of breath and wheezing    . TRAMADOL HCL 50 MG PO TABS Oral Take 50 mg by mouth as needed. For pain      BP 124/62  Pulse 84  Temp(Src) 98.3 F (36.8 C) (Oral)  Resp 20  Ht 5\' 7"  (1.702 m)  Wt 151 lb (68.493 kg)  BMI 23.65 kg/m2  SpO2 100%  Physical Exam  Nursing note and vitals reviewed. Constitutional: He is oriented to person, place, and time. He appears well-developed and well-nourished. No distress.  HENT:  Head: Normocephalic and atraumatic.    Mouth/Throat: Oropharynx is clear and moist. No oropharyngeal exudate.       TMs normal bilaterally. No erythema.   Eyes: EOM are normal. Pupils are equal, round, and reactive to light.  Neck: Neck supple. No tracheal deviation present.  Cardiovascular: Normal rate, regular rhythm and normal heart sounds.   Pulmonary/Chest: Effort normal and breath sounds normal. No respiratory distress. He has no wheezes. He has no rales.       No rhonchi  Abdominal: Soft. Bowel sounds are normal. He exhibits no distension. There is no tenderness.  Musculoskeletal: Normal range of motion. He exhibits no edema.  Neurological: He is alert and oriented to person, place, and time. No sensory deficit.  Skin: Skin is warm and dry.  Psychiatric: He has a normal mood and affect. His behavior is normal.    ED Course  Procedures (including critical care time)  DIAGNOSTIC STUDIES: Oxygen Saturation is 100% on room air, normal by my interpretation.    COORDINATION OF CARE:     Labs Reviewed - No data to display No results found.   No diagnosis found.    MDM  Patient with signs and symptoms of the flu. No signs of pneumonia. No indications for chest x-ray by lung exam, and no apparent otitis media or pharyngitis, or sinusitis by history or physical exam. I will treat the patient appear clear for influenza especially as he reports that he has had recent contact with sick individuals. The patient states his understanding of and agreement with the plan of care. Hydrocodone will be used for cough suppression, myalgias.         Felisa Bonier, MD 05/13/11 1719

## 2011-06-07 ENCOUNTER — Other Ambulatory Visit: Payer: Self-pay | Admitting: Sports Medicine

## 2011-06-28 ENCOUNTER — Ambulatory Visit
Admission: RE | Admit: 2011-06-28 | Discharge: 2011-06-28 | Disposition: A | Discharge status: Home / Self Care | Payer: BC Managed Care – PPO | Source: Ambulatory Visit | Attending: Sports Medicine | Admitting: Sports Medicine

## 2011-06-28 LAB — HM DEXA SCAN

## 2011-09-28 ENCOUNTER — Emergency Department (HOSPITAL_BASED_OUTPATIENT_CLINIC_OR_DEPARTMENT_OTHER): Payer: BC Managed Care – PPO

## 2011-09-28 ENCOUNTER — Emergency Department (HOSPITAL_BASED_OUTPATIENT_CLINIC_OR_DEPARTMENT_OTHER)
Admission: EM | Admit: 2011-09-28 | Discharge: 2011-09-28 | Disposition: A | Discharge status: Home / Self Care | Payer: BC Managed Care – PPO | Attending: Emergency Medicine | Admitting: Emergency Medicine

## 2011-09-28 ENCOUNTER — Encounter (HOSPITAL_BASED_OUTPATIENT_CLINIC_OR_DEPARTMENT_OTHER): Payer: Self-pay | Admitting: *Deleted

## 2011-09-28 DIAGNOSIS — R059 Cough, unspecified: Secondary | ICD-10-CM

## 2011-09-28 DIAGNOSIS — Z951 Presence of aortocoronary bypass graft: Secondary | ICD-10-CM | POA: Insufficient documentation

## 2011-09-28 DIAGNOSIS — G4730 Sleep apnea, unspecified: Secondary | ICD-10-CM | POA: Insufficient documentation

## 2011-09-28 DIAGNOSIS — K219 Gastro-esophageal reflux disease without esophagitis: Secondary | ICD-10-CM | POA: Insufficient documentation

## 2011-09-28 DIAGNOSIS — Z7982 Long term (current) use of aspirin: Secondary | ICD-10-CM | POA: Insufficient documentation

## 2011-09-28 DIAGNOSIS — R05 Cough: Secondary | ICD-10-CM | POA: Insufficient documentation

## 2011-09-28 DIAGNOSIS — R509 Fever, unspecified: Secondary | ICD-10-CM | POA: Insufficient documentation

## 2011-09-28 DIAGNOSIS — Z79899 Other long term (current) drug therapy: Secondary | ICD-10-CM | POA: Insufficient documentation

## 2011-09-28 DIAGNOSIS — I2510 Atherosclerotic heart disease of native coronary artery without angina pectoris: Secondary | ICD-10-CM | POA: Insufficient documentation

## 2011-09-28 DIAGNOSIS — E785 Hyperlipidemia, unspecified: Secondary | ICD-10-CM | POA: Insufficient documentation

## 2011-09-28 LAB — DIFFERENTIAL
Eosinophils Absolute: 0.1 10*3/uL (ref 0.0–0.7)
Lymphocytes Relative: 11 % — ABNORMAL LOW (ref 12–46)
Lymphs Abs: 0.9 10*3/uL (ref 0.7–4.0)
Neutro Abs: 6.1 10*3/uL (ref 1.7–7.7)
Neutrophils Relative %: 77 % (ref 43–77)

## 2011-09-28 LAB — CBC
MCH: 30.6 pg (ref 26.0–34.0)
Platelets: 143 10*3/uL — ABNORMAL LOW (ref 150–400)
RBC: 4.44 MIL/uL (ref 4.22–5.81)
WBC: 8 10*3/uL (ref 4.0–10.5)

## 2011-09-28 LAB — BASIC METABOLIC PANEL
GFR calc non Af Amer: 73 mL/min — ABNORMAL LOW (ref >90)
Glucose, Bld: 102 mg/dL — ABNORMAL HIGH (ref 70–99)
Potassium: 4.7 mEq/L (ref 3.5–5.1)
Sodium: 137 mEq/L (ref 135–145)

## 2011-09-28 MED ORDER — MOXIFLOXACIN HCL 400 MG PO TABS: 400.0000 mg | ORAL_TABLET | Freq: Every day | ORAL | Status: AC

## 2011-09-28 MED ORDER — ACETAMINOPHEN 325 MG PO TABS
650.0000 mg | ORAL_TABLET | Freq: Once | ORAL | Status: AC
  Administered 2011-09-28: 650 mg via ORAL
  Filled 2011-09-28: qty 2

## 2011-09-28 NOTE — ED Notes (Signed)
Cough today. No fever. States he just does not feel good. Sore throat all week.

## 2011-09-28 NOTE — ED Provider Notes (Signed)
History     CSN: [id number]  Arrival date & time 09/28/11  1708   First MD Initiated Contact with Patient 09/28/11 1738      No chief complaint on file.    HPI Patient with 24 hour history of cough and chills.  Cough is nonproductive.  Has slight sore throat also.  No nausea no vomiting no diarrhea.  No previous history of smoking or lung problems. Past Medical History  Diagnosis Date  . Unspecified essential hypertension   . Esophageal reflux   . Other extrapyramidal disease and abnormal movement disorder   . Hypersomnia with sleep apnea, unspecified   . Unspecified sleep apnea   . Asthma   . Hyperlipidemia   . CAD (coronary artery disease)   . Enlarged prostate   . Kyphosis     Past Surgical History  Procedure Date  . Coronary artery bypass graft   . Tonsillectomy   . Shoulder surgery     left    Family History  Problem Relation Age of Onset  . Stroke Mother   . Hypertension Father   . Stroke Father     History  Substance Use Topics  . Smoking status: Never Smoker   . Smokeless tobacco: Not on file  . Alcohol Use: Yes      Review of Systems All other review of systems are negative. Allergies  Review of patient's allergies indicates no known allergies.  Home Medications   Current Outpatient Rx  Name Route Sig Dispense Refill  . ASPIRIN 325 MG PO TBEC Oral Take 325 mg by mouth daily.      . ATENOLOL 50 MG PO TABS Oral Take 50 mg by mouth daily.      Marland Kitchen CALCIUM CITRATE 250 MG PO TABS Oral Take 1 tablet by mouth daily.     Marland Kitchen CALCIUM-CHOLECALCIFEROL 200-250 MG-UNIT PO TABS Oral Take 1 tablet by mouth daily as needed.    Marland Kitchen HYDROCOD POLST-CPM POLST ER 10-8 MG/5ML PO LQCR Oral Take 5 mLs by mouth every 12 (twelve) hours. For cough    . VITAMIN D 1000 UNITS PO TABS Oral Take 1,000 Units by mouth daily.      . COQ10 100 MG PO CAPS Oral Take by mouth daily.      . DENOSUMAB 60 MG/ML [place] SOLN Subcutaneous Inject 60 mg into the skin every 6 (six) months.  Administer in upper arm, thigh, or abdomen.  Patient receives this injection every 6 months.    Marland Kitchen DOXAZOSIN MESYLATE 4 MG PO TABS Oral Take 4 mg by mouth at bedtime.      . DUTASTERIDE 0.5 MG PO CAPS Oral Take 0.5 mg by mouth daily.      Marland Kitchen GLUCOSAMINE 500 MG PO CAPS Oral Take 1,000 mg by mouth daily.     Marland Kitchen LEVOCETIRIZINE DIHYDROCHLORIDE 5 MG PO TABS Oral Take 5 mg by mouth every evening.      Marland Kitchen MONTELUKAST SODIUM 10 MG PO TABS Oral Take 10 mg by mouth at bedtime.      Marland Kitchen ONE-DAILY MULTI VITAMINS PO TABS Oral Take 1 tablet by mouth daily.      Marland Kitchen OMEPRAZOLE 40 MG PO CPDR Oral Take 40 mg by mouth daily.      Marland Kitchen ROSUVASTATIN CALCIUM 20 MG PO TABS Oral Take 10 mg by mouth daily.     Marland Kitchen METHOCARBAMOL 750 MG PO TABS Oral Take 750 mg by mouth as needed. For muscle spasms    . MOXIFLOXACIN HCL  400 MG PO TABS Oral Take 1 tablet (400 mg total) by mouth daily. 7 tablet 0    BP 142/74  Pulse 73  Temp(Src) 100 F (37.8 C) (Oral)  Resp 20  SpO2 99%  Physical Exam  Nursing note and vitals reviewed. Constitutional: He is oriented to person, place, and time. He appears well-developed and well-nourished. No distress.  HENT:  Head: Normocephalic and atraumatic.  Eyes: Pupils are equal, round, and reactive to light.  Neck: Normal range of motion.  Cardiovascular: Normal rate and intact distal pulses.   Pulmonary/Chest: No respiratory distress.    Abdominal: Normal appearance. He exhibits no distension.  Musculoskeletal: Normal range of motion.  Neurological: He is alert and oriented to person, place, and time. No cranial nerve deficit.  Skin: Skin is warm and dry. No rash noted.  Psychiatric: He has a normal mood and affect. His behavior is normal.    ED Course  Procedures (including critical care time)  Labs Reviewed  CBC - Abnormal; Notable for the following:    HCT 37.9 (*)    Platelets 143 (*)    All other components within normal limits  DIFFERENTIAL - Abnormal; Notable for the following:     Lymphocytes Relative 11 (*)    All other components within normal limits  BASIC METABOLIC PANEL - Abnormal; Notable for the following:    Glucose, Bld 102 (*)    GFR calc non Af Amer 73 (*)    GFR calc Af Amer 84 (*)    All other components within normal limits   Dg Chest 2 View  09/28/2011  *RADIOLOGY REPORT*  Clinical Data: Cough and fever.  Sore throat.  CHEST - 2 VIEW  Comparison: 11/09/2009  Findings: The heart size and vascularity are normal.  The lungs are somewhat hyperinflated with flattening of the diaphragm consistent with emphysema.  No infiltrates or effusions.  No acute osseous abnormality.  Evidence of prior CABG.  IMPRESSION: No acute abnormality. Emphysema.  Original Report Authenticated By: Gwynn Burly, M.D.     1. Fever   2. Cough       MDM          Nelia Shi, MD 09/28/11 [phone]

## 2011-09-28 NOTE — Discharge Instructions (Signed)
Cough, Adult  A cough is a reflex. It helps you clear your throat and airways. A cough can help heal your body. A cough can last 2 or 3 weeks (acute) or may last more than 8 weeks (chronic). Some common causes of a cough can include an infection, allergy, or a cold. HOME CARE  Only take medicine as told by your doctor.   If given, take your medicines (antibiotics) as told. Finish them even if you start to feel better.   Use a cold steam vaporizer or humidier in your home. This can help loosen thick spit (secretions).   Sleep so you are almost sitting up (semi-upright). Use pillows to do this. This helps reduce coughing.   Rest as needed.   Stop smoking if you smoke.  GET HELP RIGHT AWAY IF:  You have yellowish-white fluid (pus) in your thick spit.   Your cough gets worse.   Your medicine does not reduce coughing, and you are losing sleep.   You cough up blood.   You have trouble breathing.   Your pain gets worse and medicine does not help.   You have a fever.  MAKE SURE YOU:   Understand these instructions.   Will watch your condition.   Will get help right away if you are not doing well or get worse.  Document Released: 01/04/2011 Document Revised: 04/12/2011 Document Reviewed: 01/04/2011 [facility] Patient Information 2012 [place], [place].Fever, Adult A fever is a higher than normal body temperature. In an adult, an oral temperature around 98.6 F (37 C) is considered normal. A temperature of 100.4 F (38 C) or higher is generally considered a fever. Mild or moderate fevers generally have no long-term effects and often do not require treatment. Extreme fever (greater than or equal to 106 F or 41.1 C) can cause seizures. The sweating that may occur with repeated or prolonged fever may cause dehydration. Elderly people can develop confusion during a fever. A measured temperature can vary with:  Age.   Time of day.   Method of measurement (mouth, underarm, rectal, or  ear).  The fever is confirmed by taking a temperature with a thermometer. Temperatures can be taken different ways. Some methods are accurate and some are not.  An oral temperature is used most commonly. Electronic thermometers are fast and accurate.   An ear temperature will only be accurate if the thermometer is positioned as recommended by the manufacturer.   A rectal temperature is accurate and done for those adults who have a condition where an oral temperature cannot be taken.   An underarm (axillary) temperature is not accurate and not recommended.  Fever is a symptom, not a disease.  CAUSES   Infections commonly cause fever.   Some noninfectious causes for fever include:   Some arthritis conditions.   Some thyroid or adrenal gland conditions.   Some immune system conditions.   Some types of cancer.   A medicine reaction.   High doses of certain street drugs such as methamphetamine.   Dehydration.   Exposure to high outside or room temperatures.   Occasionally, the source of a fever cannot be determined. This is sometimes called a "fever of unknown origin" (FUO).   Some situations may lead to a temporary rise in body temperature that may go away on its own. Examples are:   Childbirth.   Surgery.   Intense exercise.  HOME CARE INSTRUCTIONS   Take appropriate medicines for fever. Follow dosing instructions carefully. If you use  acetaminophen to reduce the fever, be careful to avoid taking other medicines that also contain acetaminophen. Do not take aspirin for a fever if you are younger than age 33. There is an association with Reye's syndrome. Reye's syndrome is a rare but potentially deadly disease.   If an infection is present and antibiotics have been prescribed, take them as directed. Finish them even if you start to feel better.   Rest as needed.   Maintain an adequate fluid intake. To prevent dehydration during an illness with prolonged or recurrent  fever, you may need to drink extra fluid.Drink enough fluids to keep your urine clear or pale yellow.   Sponging or bathing with room temperature water may help reduce body temperature. Do not use ice water or alcohol sponge baths.   Dress comfortably, but do not over-bundle.  SEEK MEDICAL CARE IF:   You are unable to keep fluids down.   You develop vomiting or diarrhea.   You are not feeling at least partly better after 3 days.   You develop new symptoms or problems.  SEEK IMMEDIATE MEDICAL CARE IF:   You have shortness of breath or trouble breathing.   You develop excessive weakness.   You are dizzy or you faint.   You are extremely thirsty or you are making little or no urine.   You develop new pain that was not there before (such as in the head, neck, chest, back, or abdomen).   You have persistant vomiting and diarrhea for more than 1 to 2 days.   You develop a stiff neck or your eyes become sensitive to light.   You develop a skin rash.   You have a fever or persistent symptoms for more than 2 to 3 days.   You have a fever and your symptoms suddenly get worse.  MAKE SURE YOU:   Understand these instructions.   Will watch your condition.   Will get help right away if you are not doing well or get worse.  Document Released: 10/17/2000 Document Revised: 04/12/2011 Document Reviewed: 02/22/2011 [facility] Patient Information 2012 [place], [place].

## 2012-03-17 ENCOUNTER — Encounter: Payer: Self-pay | Admitting: Internal Medicine

## 2012-03-17 ENCOUNTER — Ambulatory Visit (INDEPENDENT_AMBULATORY_CARE_PROVIDER_SITE_OTHER): Payer: BC Managed Care – PPO | Admitting: Internal Medicine

## 2012-03-17 VITALS — BP 142/72 | HR 62 | Ht 67.0 in | Wt 158.4 lb

## 2012-03-17 DIAGNOSIS — G4733 Obstructive sleep apnea (adult) (pediatric): Secondary | ICD-10-CM

## 2012-03-17 DIAGNOSIS — G4710 Hypersomnia, unspecified: Secondary | ICD-10-CM

## 2012-03-17 NOTE — Patient Instructions (Addendum)
Order- DME Advanced change CPAP autotitration range to 8-15 cwp       Dx OSA  Please call as needed

## 2012-03-17 NOTE — Progress Notes (Signed)
Subjective:    Patient ID: Brent Griffith, male    DOB: [birth year], 76 y.o.   MRN: [id number]  HPI 12/08/10- 76 year old male never smoker, followed for obstructive sleep apnea, periodic limb movement, CAD/ CABG // Dr [place] Callas for allergic asthma Last here- November 10, 2009- note reviewed CPAP comfortable, used on trips and all night every night-AutoPAP Advanced, full face mask. Wife likes it because it prevents snoring. Feels well rested. Usual bedtime and up around 5- he accepts responsibility to get enough sleep. Denies EDS driving or etc. No needs for changes. He gets supplies ok. We discussed mask options. Heart surgery Nov 2010 CABG 4v  03/17/12- 76 year old male never smoker, followed for obstructive sleep apnea, periodic limb movement, CAD/ CABG // Dr [place] Callas for allergic asthma  Wears CPAP AutoPap/ Advanced nightly 6-8hrs during week and 10-12 hours on the weekends. Denies problems with pressure and mask. Sometimes pressure is too high or too low Family tells him he will snore through very rarely.  Review of Systems- see HPI Constitutional:   No-   weight loss, night sweats, fevers, chills, fatigue, lassitude. HEENT:   No-   headaches, difficulty swallowing, tooth/dental problems, sore throat,                  No-   sneezing, itching, ear ache, nasal congestion, post nasal drip,  CV:  No-   chest pain, orthopnea, PND, swelling in lower extremities, anasarca,dizziness, palpitations GI:  No-   heartburn, indigestion, abdominal pain, nausea, vomiting,  Resp: No-   shortness of breath with exertion or at rest.  No-  excess mucus,             No-   productive cough,  No non-productive cough,  No-  coughing up of blood.              No-   change in color of mucus.  No- wheezing.   Skin: No-   rash or lesions. GU: . MS:  No-   joint pain or swelling. Marland Kitchen Psych:  No- change in mood or affect. No depression or anxiety.  No memory loss.    Objective:   Physical Exam General- Alert, Oriented,  Affect-appropriate, Distress- none acute. Medium build. Skin- rash-none, lesions- none, excoriation- none Lymphadenopathy- none Head- atraumatic            Eyes- Gross vision intact, PERRLA, conjunctivae clear secretions            Ears- Hearing, canals normal            Nose- Clear, +Septal dev/ external nasal deviation,  No-mucus, polyps, erosion, perforation             Throat- Mallampati II-III , mucosa clear , drainage- none, tonsils- atrophic Neck- flexible , trachea midline, no stridor , thyroid nl, carotid no bruit Chest - symmetrical excursion , unlabored           Heart/CV- RRR , no murmur , no gallop  , no rub, nl s1 s2                           - JVD- none , edema- none, stasis changes- none, varices- none           Lung- clear to P&A, wheeze- none, cough- none , dullness-none, rub- none           Chest wall-  Abd-  Br/ Gen/ Rectal- Not done, not  indicated Extrem- cyanosis- none, clubbing, none, atrophy- none, strength- nl Neuro- grossly intact to observation    Assessment & Plan:

## 2012-03-23 ENCOUNTER — Encounter: Payer: Self-pay | Admitting: Internal Medicine

## 2012-03-23 NOTE — Assessment & Plan Note (Signed)
The range may be too broad since at times he feels pressure is too high or too low. Plan-we will have his home care company narrow his autotitration range 8-15

## 2012-04-22 ENCOUNTER — Emergency Department (HOSPITAL_BASED_OUTPATIENT_CLINIC_OR_DEPARTMENT_OTHER)
Admission: EM | Admit: 2012-04-22 | Discharge: 2012-04-22 | Disposition: A | Discharge status: Home / Self Care | Payer: BC Managed Care – PPO | Attending: Emergency Medicine | Admitting: Emergency Medicine

## 2012-04-22 ENCOUNTER — Encounter (HOSPITAL_BASED_OUTPATIENT_CLINIC_OR_DEPARTMENT_OTHER): Payer: Self-pay

## 2012-04-22 ENCOUNTER — Emergency Department (HOSPITAL_BASED_OUTPATIENT_CLINIC_OR_DEPARTMENT_OTHER): Payer: BC Managed Care – PPO

## 2012-04-22 DIAGNOSIS — I10 Essential (primary) hypertension: Secondary | ICD-10-CM | POA: Insufficient documentation

## 2012-04-22 DIAGNOSIS — K219 Gastro-esophageal reflux disease without esophagitis: Secondary | ICD-10-CM | POA: Insufficient documentation

## 2012-04-22 DIAGNOSIS — I2510 Atherosclerotic heart disease of native coronary artery without angina pectoris: Secondary | ICD-10-CM | POA: Insufficient documentation

## 2012-04-22 DIAGNOSIS — M4000 Postural kyphosis, site unspecified: Secondary | ICD-10-CM | POA: Insufficient documentation

## 2012-04-22 DIAGNOSIS — E785 Hyperlipidemia, unspecified: Secondary | ICD-10-CM | POA: Insufficient documentation

## 2012-04-22 DIAGNOSIS — Z9889 Other specified postprocedural states: Secondary | ICD-10-CM | POA: Insufficient documentation

## 2012-04-22 DIAGNOSIS — J45909 Unspecified asthma, uncomplicated: Secondary | ICD-10-CM | POA: Insufficient documentation

## 2012-04-22 DIAGNOSIS — R059 Cough, unspecified: Secondary | ICD-10-CM | POA: Insufficient documentation

## 2012-04-22 DIAGNOSIS — B349 Viral infection, unspecified: Secondary | ICD-10-CM

## 2012-04-22 DIAGNOSIS — N400 Enlarged prostate without lower urinary tract symptoms: Secondary | ICD-10-CM | POA: Insufficient documentation

## 2012-04-22 DIAGNOSIS — R6883 Chills (without fever): Secondary | ICD-10-CM | POA: Insufficient documentation

## 2012-04-22 DIAGNOSIS — Z79899 Other long term (current) drug therapy: Secondary | ICD-10-CM | POA: Insufficient documentation

## 2012-04-22 DIAGNOSIS — B338 Other specified viral diseases: Secondary | ICD-10-CM | POA: Insufficient documentation

## 2012-04-22 DIAGNOSIS — Z7982 Long term (current) use of aspirin: Secondary | ICD-10-CM | POA: Insufficient documentation

## 2012-04-22 DIAGNOSIS — R05 Cough: Secondary | ICD-10-CM | POA: Insufficient documentation

## 2012-04-22 DIAGNOSIS — Z8669 Personal history of other diseases of the nervous system and sense organs: Secondary | ICD-10-CM | POA: Insufficient documentation

## 2012-04-22 DIAGNOSIS — Z951 Presence of aortocoronary bypass graft: Secondary | ICD-10-CM | POA: Insufficient documentation

## 2012-04-22 DIAGNOSIS — J029 Acute pharyngitis, unspecified: Secondary | ICD-10-CM | POA: Insufficient documentation

## 2012-04-22 MED ORDER — HYDROCOD POLST-CHLORPHEN POLST 10-8 MG/5ML PO LQCR: 5.0000 mL | Freq: Two times a day (BID) | ORAL | Status: DC | PRN

## 2012-04-22 MED ORDER — OSELTAMIVIR PHOSPHATE 75 MG PO CAPS: 75.0000 mg | ORAL_CAPSULE | Freq: Two times a day (BID) | ORAL | Status: DC

## 2012-04-22 NOTE — ED Notes (Signed)
Karen Sofia, PA-C at bedside 

## 2012-04-22 NOTE — ED Notes (Signed)
Pt reports not feeling well last night and awakened this am with generalized body aches, chills, cough and diarrhea x 2 today.

## 2012-04-22 NOTE — ED Provider Notes (Signed)
History     CSN: [id number]  Arrival date & time 04/22/12  1648   First MD Initiated Contact with Patient 04/22/12 1708      Chief Complaint  Patient presents with  . Generalized Body Aches    (Consider location/radiation/quality/duration/timing/severity/associated sxs/prior treatment) Patient is a 76 y.o. male presenting with cough. The history is provided by the patient. No language interpreter was used.  Cough This is a new problem. The current episode started yesterday. The problem occurs constantly. The problem has been gradually worsening. The cough is non-productive. There has been no fever. Associated symptoms include chills and sore throat. He has tried nothing for the symptoms. He is not a smoker. His past medical history is significant for pneumonia.  Pt reports he began feeling bad yesterday.  Pt complains of bodyaches, chills and a cough.  Past Medical History  Diagnosis Date  . Unspecified essential hypertension   . Esophageal reflux   . Other extrapyramidal disease and abnormal movement disorder   . Hypersomnia with sleep apnea, unspecified   . Unspecified sleep apnea   . Asthma   . Hyperlipidemia   . CAD (coronary artery disease)   . Enlarged prostate   . Kyphosis     Past Surgical History  Procedure Date  . Coronary artery bypass graft   . Tonsillectomy   . Shoulder surgery     left    Family History  Problem Relation Age of Onset  . Stroke Mother   . Hypertension Father   . Stroke Father     History  Substance Use Topics  . Smoking status: Never Smoker   . Smokeless tobacco: Not on file  . Alcohol Use: Yes      Review of Systems  Constitutional: Positive for chills.  HENT: Positive for sore throat.   Respiratory: Positive for cough.   All other systems reviewed and are negative.    Allergies  Review of patient's allergies indicates no known allergies.  Home Medications   Current Outpatient Rx  Name  Route  Sig  Dispense   Refill  . ASPIRIN 325 MG PO TBEC   Oral   Take 325 mg by mouth daily.           . ATENOLOL 50 MG PO TABS   Oral   Take 50 mg by mouth daily.           Marland Kitchen CALCIUM CITRATE 250 MG PO TABS   Oral   Take 1 tablet by mouth daily.          Marland Kitchen CALCIUM-CHOLECALCIFEROL 200-250 MG-UNIT PO TABS   Oral   Take 1 tablet by mouth daily as needed.         Marland Kitchen HYDROCOD POLST-CPM POLST ER 10-8 MG/5ML PO LQCR   Oral   Take 5 mLs by mouth every 12 (twelve) hours. For cough         . VITAMIN D 1000 UNITS PO TABS   Oral   Take 1,000 Units by mouth daily.           . COQ10 100 MG PO CAPS   Oral   Take by mouth daily.           . DENOSUMAB 60 MG/ML [place] SOLN   Subcutaneous   Inject 60 mg into the skin every 6 (six) months. Administer in upper arm, thigh, or abdomen.  Patient receives this injection every 6 months.         Marland Kitchen DOXAZOSIN MESYLATE  4 MG PO TABS   Oral   Take 4 mg by mouth at bedtime.           . DUTASTERIDE 0.5 MG PO CAPS   Oral   Take 0.5 mg by mouth daily.           Marland Kitchen GLUCOSAMINE 500 MG PO CAPS   Oral   Take 1,000 mg by mouth daily.          Marland Kitchen LEVOCETIRIZINE DIHYDROCHLORIDE 5 MG PO TABS   Oral   Take 5 mg by mouth every evening.           Marland Kitchen METHOCARBAMOL 750 MG PO TABS   Oral   Take 750 mg by mouth as needed. For muscle spasms         . MONTELUKAST SODIUM 10 MG PO TABS   Oral   Take 10 mg by mouth at bedtime.           Marland Kitchen ONE-DAILY MULTI VITAMINS PO TABS   Oral   Take 1 tablet by mouth daily.           Marland Kitchen OMEPRAZOLE 40 MG PO CPDR   Oral   Take 40 mg by mouth daily.           Marland Kitchen PROBIOTIC DAILY PO   Oral   Take 1 capsule by mouth daily.         Marland Kitchen ROSUVASTATIN CALCIUM 20 MG PO TABS   Oral   Take 10 mg by mouth daily.            BP 126/70  Pulse 77  Temp 98.3 F (36.8 C) (Oral)  Resp 16  Ht 5\' 7"  (1.702 m)  Wt 157 lb (71.215 kg)  BMI 24.59 kg/m2  SpO2 100%  Physical Exam  Nursing note and vitals  reviewed. Constitutional: He is oriented to person, place, and time. He appears well-developed and well-nourished.  HENT:  Head: Normocephalic.  Right Ear: External ear normal.  Left Ear: External ear normal.  Nose: Nose normal.  Mouth/Throat: Oropharynx is clear and moist.  Eyes: Conjunctivae normal and EOM are normal. Pupils are equal, round, and reactive to light.  Neck: Normal range of motion. Neck supple.  Cardiovascular: Normal rate and normal heart sounds.   Pulmonary/Chest: Effort normal and breath sounds normal.  Abdominal: Soft.  Musculoskeletal: Normal range of motion.  Neurological: He is alert and oriented to person, place, and time.  Skin: Skin is warm.  Psychiatric: He has a normal mood and affect.    ED Course  Procedures (including critical care time)  Labs Reviewed - No data to display No results found.   No diagnosis found.    MDM  No pneumonia,   Dr. Anitra Lauth in to see and examine.   Pt given rx for tamiflu and tussionex.   i advised see Dr. Chilton Si for recheck in 2-3 days      Lonia Skinner [place], [place] 04/22/12 [phone]

## 2012-04-22 NOTE — ED Notes (Signed)
MD at bedside. 

## 2012-04-22 NOTE — ED Notes (Signed)
Body aches, chills, cough-started last night

## 2012-04-22 NOTE — ED Notes (Signed)
Patient transported to X-ray 

## 2012-04-25 NOTE — ED Provider Notes (Signed)
Medical screening examination/treatment/procedure(s) were conducted as a shared visit with non-physician practitioner(s) and myself.  I personally evaluated the patient during the encounter Pt o/w healthy with sx most consistent with flu for <24 hrs.  Normal VS.  Well appearing in no acute distress and neg CXR.  Pt started on tamiflu and close f/u.  Gwyneth Sprout, MD 04/25/12 [phone]

## 2012-06-04 ENCOUNTER — Encounter: Payer: Self-pay | Admitting: Geriatric Medicine

## 2012-06-13 ENCOUNTER — Other Ambulatory Visit: Payer: Self-pay | Admitting: Dermatology

## 2012-07-10 ENCOUNTER — Emergency Department (HOSPITAL_BASED_OUTPATIENT_CLINIC_OR_DEPARTMENT_OTHER)
Admission: EM | Admit: 2012-07-10 | Discharge: 2012-07-10 | Disposition: A | Discharge status: Home / Self Care | Payer: BC Managed Care – PPO | Attending: Emergency Medicine | Admitting: Emergency Medicine

## 2012-07-10 ENCOUNTER — Encounter (HOSPITAL_BASED_OUTPATIENT_CLINIC_OR_DEPARTMENT_OTHER): Payer: Self-pay | Admitting: *Deleted

## 2012-07-10 DIAGNOSIS — Z8711 Personal history of peptic ulcer disease: Secondary | ICD-10-CM | POA: Insufficient documentation

## 2012-07-10 DIAGNOSIS — Z951 Presence of aortocoronary bypass graft: Secondary | ICD-10-CM | POA: Insufficient documentation

## 2012-07-10 DIAGNOSIS — J3489 Other specified disorders of nose and nasal sinuses: Secondary | ICD-10-CM | POA: Insufficient documentation

## 2012-07-10 DIAGNOSIS — Z8669 Personal history of other diseases of the nervous system and sense organs: Secondary | ICD-10-CM | POA: Insufficient documentation

## 2012-07-10 DIAGNOSIS — I10 Essential (primary) hypertension: Secondary | ICD-10-CM | POA: Insufficient documentation

## 2012-07-10 DIAGNOSIS — Z862 Personal history of diseases of the blood and blood-forming organs and certain disorders involving the immune mechanism: Secondary | ICD-10-CM | POA: Insufficient documentation

## 2012-07-10 DIAGNOSIS — N400 Enlarged prostate without lower urinary tract symptoms: Secondary | ICD-10-CM | POA: Insufficient documentation

## 2012-07-10 DIAGNOSIS — K219 Gastro-esophageal reflux disease without esophagitis: Secondary | ICD-10-CM | POA: Insufficient documentation

## 2012-07-10 DIAGNOSIS — Z8619 Personal history of other infectious and parasitic diseases: Secondary | ICD-10-CM | POA: Insufficient documentation

## 2012-07-10 DIAGNOSIS — J45909 Unspecified asthma, uncomplicated: Secondary | ICD-10-CM | POA: Insufficient documentation

## 2012-07-10 DIAGNOSIS — Z87448 Personal history of other diseases of urinary system: Secondary | ICD-10-CM | POA: Insufficient documentation

## 2012-07-10 DIAGNOSIS — E785 Hyperlipidemia, unspecified: Secondary | ICD-10-CM | POA: Insufficient documentation

## 2012-07-10 DIAGNOSIS — Z8679 Personal history of other diseases of the circulatory system: Secondary | ICD-10-CM | POA: Insufficient documentation

## 2012-07-10 DIAGNOSIS — Z8719 Personal history of other diseases of the digestive system: Secondary | ICD-10-CM | POA: Insufficient documentation

## 2012-07-10 DIAGNOSIS — I2510 Atherosclerotic heart disease of native coronary artery without angina pectoris: Secondary | ICD-10-CM | POA: Insufficient documentation

## 2012-07-10 DIAGNOSIS — Z8709 Personal history of other diseases of the respiratory system: Secondary | ICD-10-CM | POA: Insufficient documentation

## 2012-07-10 DIAGNOSIS — J019 Acute sinusitis, unspecified: Secondary | ICD-10-CM

## 2012-07-10 DIAGNOSIS — Z8739 Personal history of other diseases of the musculoskeletal system and connective tissue: Secondary | ICD-10-CM | POA: Insufficient documentation

## 2012-07-10 DIAGNOSIS — Z79899 Other long term (current) drug therapy: Secondary | ICD-10-CM | POA: Insufficient documentation

## 2012-07-10 DIAGNOSIS — Z125 Encounter for screening for malignant neoplasm of prostate: Secondary | ICD-10-CM | POA: Insufficient documentation

## 2012-07-10 DIAGNOSIS — Z7982 Long term (current) use of aspirin: Secondary | ICD-10-CM | POA: Insufficient documentation

## 2012-07-10 DIAGNOSIS — Z8639 Personal history of other endocrine, nutritional and metabolic disease: Secondary | ICD-10-CM | POA: Insufficient documentation

## 2012-07-10 MED ORDER — HYDROCOD POLST-CHLORPHEN POLST 10-8 MG/5ML PO LQCR: 5.0000 mL | Freq: Two times a day (BID) | ORAL | Status: DC | PRN

## 2012-07-10 MED ORDER — AZITHROMYCIN 250 MG PO TABS: 250.0000 mg | ORAL_TABLET | Freq: Every day | ORAL | Status: DC

## 2012-07-10 NOTE — ED Provider Notes (Signed)
History     CSN: [id number]  Arrival date & time 07/10/12  1055   First MD Initiated Contact with Patient 07/10/12 1118      Chief Complaint  Patient presents with  . Cough    (Consider location/radiation/quality/duration/timing/severity/associated sxs/prior treatment) HPI Comments: Patient with history of CAD/CABG presents with complaints of sinus congestion, non-productive cough for the past two weeks.  No relief with time and otc meds.  Getting worse.  No chest pain or shortness of breath.  No le edema.  Patient is a 77 y.o. male presenting with cough. The history is provided by the patient.  Cough Cough characteristics:  Non-productive Severity:  Moderate Onset quality:  Gradual Duration:  2 weeks Timing:  Constant Progression:  Worsening Chronicity:  New Smoker: no   Relieved by:  Nothing Worsened by:  Nothing tried Ineffective treatments:  None tried Associated symptoms: sinus congestion   Associated symptoms: no chest pain, no chills and no fever     Past Medical History  Diagnosis Date  . Unspecified essential hypertension   . Esophageal reflux   . Other extrapyramidal disease and abnormal movement disorder   . Hypersomnia with sleep apnea, unspecified   . Unspecified sleep apnea   . Asthma   . Hyperlipidemia   . CAD (coronary artery disease)   . Enlarged prostate   . Kyphosis   . Acute upper respiratory infections of unspecified site   . Trigger finger (acquired)   . Anemia, unspecified   . Lumbago   . Myalgia and myositis, unspecified   . Pain in limb   . Pain in limb   . Other abnormal blood chemistry   . Palpitations   . Coronary atherosclerosis of unspecified type of vessel, native or graft   . Coronary atherosclerosis of unspecified type of vessel, native or graft   . Pain in joint, shoulder region   . Senile osteoporosis   . Other malaise and fatigue   . Unspecified vitamin D deficiency   . Pain in thoracic spine   . Other symptoms involving  cardiovascular system   . Urinary frequency   . Other and unspecified hyperlipidemia   . Reflux esophagitis   . Special screening for malignant neoplasm of prostate   . Unspecified essential hypertension   . Elevated prostate specific antigen (PSA)   . Hypertrophy of prostate with urinary obstruction and other lower urinary tract symptoms (LUTS)   . Herpes zoster without mention of complication   . Acute gastric ulcer without mention of hemorrhage, perforation, or obstruction   . Nodular prostate without urinary obstruction   . Other specified disorders of rotator cuff syndrome of shoulder and allied disorders   . Scoliosis (and kyphoscoliosis), idiopathic   . Obstructive sleep apnea (adult) (pediatric)   . Impotence of organic origin   . Allergic rhinitis, cause unspecified   . Extrinsic asthma, unspecified   . Unspecified tinnitus   . Diaphragmatic hernia without mention of obstruction or gangrene     Past Surgical History  Procedure Laterality Date  . Coronary artery bypass graft    . Tonsillectomy    . Shoulder surgery      left  . Mole removal    . Cardiac catheterization  2010    Family History  Problem Relation Age of Onset  . Stroke Mother   . Hypertension Father   . Stroke Father   . Heart disease Father   . Heart disease Paternal Grandfather     History  Substance Use Topics  . Smoking status: Never Smoker   . Smokeless tobacco: Not on file  . Alcohol Use: No      Review of Systems  Constitutional: Negative for fever and chills.  Respiratory: Positive for cough.   Cardiovascular: Negative for chest pain.  All other systems reviewed and are negative.    Allergies  Compazine  Home Medications   Current Outpatient Rx  Name  Route  Sig  Dispense  Refill  . aspirin 325 MG EC tablet   Oral   Take 325 mg by mouth daily.           Marland Kitchen atenolol (TENORMIN) 50 MG tablet   Oral   Take 50 mg by mouth daily.           . Calcium Citrate 250 MG  TABS   Oral   Take 1 tablet by mouth daily.          . Calcium-Cholecalciferol (CALCET PETITES) 200-250 MG-UNIT TABS   Oral   Take 1 tablet by mouth daily as needed.         . chlorpheniramine-HYDROcodone (TUSSIONEX PENNKINETIC ER) 10-8 MG/5ML LQCR   Oral   Take 5 mLs by mouth every 12 (twelve) hours. For cough         . chlorpheniramine-HYDROcodone (TUSSIONEX PENNKINETIC ER) 10-8 MG/5ML LQCR   Oral   Take 5 mLs by mouth every 12 (twelve) hours as needed.   80 mL   0   . cholecalciferol (VITAMIN D) 1000 UNITS tablet   Oral   Take 1,000 Units by mouth daily.           . Coenzyme Q10 (COQ10) 100 MG CAPS   Oral   Take by mouth daily.           Marland Kitchen denosumab (PROLIA) 60 MG/ML SOLN injection   Subcutaneous   Inject 60 mg into the skin every 6 (six) months. Administer in upper arm, thigh, or abdomen.  Patient receives this injection every 6 months.         . doxazosin (CARDURA) 4 MG tablet   Oral   Take 4 mg by mouth at bedtime.           . dutasteride (AVODART) 0.5 MG capsule   Oral   Take 0.5 mg by mouth daily.           . Glucosamine 500 MG CAPS   Oral   Take 1,000 mg by mouth daily.          Marland Kitchen levocetirizine (XYZAL) 5 MG tablet   Oral   Take 5 mg by mouth every evening.           . methocarbamol (ROBAXIN) 750 MG tablet   Oral   Take 750 mg by mouth as needed. For muscle spasms         . montelukast (SINGULAIR) 10 MG tablet   Oral   Take 10 mg by mouth at bedtime.           . Multiple Vitamin (MULTIVITAMIN) tablet   Oral   Take 1 tablet by mouth daily.           Marland Kitchen omeprazole (PRILOSEC) 40 MG capsule   Oral   Take 40 mg by mouth daily.           Marland Kitchen oseltamivir (TAMIFLU) 75 MG capsule   Oral   Take 1 capsule (75 mg total) by mouth every 12 (twelve) hours.   10 capsule  0   . Probiotic Product (PROBIOTIC DAILY PO)   Oral   Take 1 capsule by mouth daily.         . rosuvastatin (CRESTOR) 20 MG tablet   Oral   Take 10 mg by  mouth daily.            BP 151/71  Temp(Src) 98.7 F (37.1 C) (Oral)  Resp 22  Ht 5\' 6"  (1.676 m)  Wt 150 lb (68.04 kg)  BMI 24.22 kg/m2  SpO2 100%  Physical Exam  Nursing note and vitals reviewed. Constitutional: He is oriented to person, place, and time. He appears well-developed and well-nourished. No distress.  HENT:  Head: Normocephalic and atraumatic.  Mouth/Throat: Oropharynx is clear and moist.  Neck: Normal range of motion. Neck supple.  Cardiovascular: Normal rate and regular rhythm.   No murmur heard. Pulmonary/Chest: Breath sounds normal. No respiratory distress. He has no wheezes. He has no rales.  Abdominal: Soft. Bowel sounds are normal. He exhibits no distension. There is no tenderness.  Musculoskeletal: Normal range of motion. He exhibits no edema.  Lymphadenopathy:    He has no cervical adenopathy.  Neurological: He is alert and oriented to person, place, and time.  Skin: Skin is warm and dry. He is not diaphoretic.    ED Course  Procedures (including critical care time)  Labs Reviewed - No data to display No results found.   No diagnosis found.    MDM  This seems like sinus infection.  There is no chest pain or congestion and no edema.  Highly doubt chf or pneumonia.  Will treat with zmax, return prn.        Geoffery Lyons, MD 07/10/12 [phone]

## 2012-07-10 NOTE — ED Notes (Signed)
Patient states he has a 2 week history of sinus congestion, sneezing and non productive cough.  States the cough has worsened over the last 2 days and he did not sleep well last night due to the coughing.

## 2012-07-22 ENCOUNTER — Encounter: Payer: Self-pay | Admitting: *Deleted

## 2012-07-22 DIAGNOSIS — E785 Hyperlipidemia, unspecified: Secondary | ICD-10-CM

## 2012-07-22 DIAGNOSIS — R7989 Other specified abnormal findings of blood chemistry: Secondary | ICD-10-CM

## 2012-07-22 DIAGNOSIS — I10 Essential (primary) hypertension: Secondary | ICD-10-CM

## 2012-07-22 NOTE — Progress Notes (Signed)
This encounter was created in error - please disregard.

## 2012-07-23 ENCOUNTER — Other Ambulatory Visit: Payer: Self-pay | Admitting: *Deleted

## 2012-07-23 DIAGNOSIS — D649 Anemia, unspecified: Secondary | ICD-10-CM

## 2012-07-23 DIAGNOSIS — R7989 Other specified abnormal findings of blood chemistry: Secondary | ICD-10-CM

## 2012-07-23 DIAGNOSIS — N401 Enlarged prostate with lower urinary tract symptoms: Secondary | ICD-10-CM

## 2012-07-23 DIAGNOSIS — E785 Hyperlipidemia, unspecified: Secondary | ICD-10-CM

## 2012-07-23 DIAGNOSIS — I10 Essential (primary) hypertension: Secondary | ICD-10-CM

## 2012-07-28 ENCOUNTER — Other Ambulatory Visit: Payer: BC Managed Care – PPO

## 2012-07-28 DIAGNOSIS — N138 Other obstructive and reflux uropathy: Secondary | ICD-10-CM

## 2012-07-28 DIAGNOSIS — R7989 Other specified abnormal findings of blood chemistry: Secondary | ICD-10-CM

## 2012-07-28 DIAGNOSIS — N401 Enlarged prostate with lower urinary tract symptoms: Secondary | ICD-10-CM

## 2012-07-28 DIAGNOSIS — I10 Essential (primary) hypertension: Secondary | ICD-10-CM

## 2012-07-28 DIAGNOSIS — E785 Hyperlipidemia, unspecified: Secondary | ICD-10-CM

## 2012-07-28 DIAGNOSIS — D649 Anemia, unspecified: Secondary | ICD-10-CM

## 2012-07-30 ENCOUNTER — Ambulatory Visit (INDEPENDENT_AMBULATORY_CARE_PROVIDER_SITE_OTHER): Payer: BC Managed Care – PPO | Admitting: Internal Medicine

## 2012-07-30 ENCOUNTER — Encounter: Payer: Self-pay | Admitting: Internal Medicine

## 2012-07-30 VITALS — BP 122/62 | HR 66 | Temp 97.0°F | Resp 14 | Ht 65.0 in | Wt 154.0 lb

## 2012-07-30 DIAGNOSIS — E785 Hyperlipidemia, unspecified: Secondary | ICD-10-CM

## 2012-07-30 DIAGNOSIS — Z951 Presence of aortocoronary bypass graft: Secondary | ICD-10-CM | POA: Insufficient documentation

## 2012-07-30 DIAGNOSIS — I2510 Atherosclerotic heart disease of native coronary artery without angina pectoris: Secondary | ICD-10-CM

## 2012-07-30 DIAGNOSIS — I10 Essential (primary) hypertension: Secondary | ICD-10-CM

## 2012-07-30 LAB — CBC WITH DIFFERENTIAL/PLATELET
Basophils Absolute: 0 10*3/uL (ref 0.0–0.2)
Eos: 3 % (ref 0–5)
Eosinophils Absolute: 0.2 10*3/uL (ref 0.0–0.4)
Immature Grans (Abs): 0 10*3/uL (ref 0.0–0.1)
Immature Granulocytes: 0 % (ref 0–2)
Lymphocytes Absolute: 1.8 10*3/uL (ref 0.7–3.1)
MCHC: 34.4 g/dL (ref 31.5–35.7)
MCV: 89 fL (ref 79–97)
Monocytes Absolute: 0.4 10*3/uL (ref 0.1–0.9)
Neutrophils Relative %: 61 % (ref 40–74)
RDW: 13.2 % (ref 12.3–15.4)
WBC: 6.5 10*3/uL (ref 3.4–10.8)

## 2012-07-30 LAB — COMPREHENSIVE METABOLIC PANEL
Albumin/Globulin Ratio: 2.1 (ref 1.1–2.5)
Albumin: 4.5 g/dL (ref 3.5–4.8)
Alkaline Phosphatase: 43 IU/L (ref 39–117)
BUN/Creatinine Ratio: 15 (ref 10–22)
BUN: 15 mg/dL (ref 8–27)
Chloride: 103 mmol/L (ref 97–108)
GFR calc Af Amer: 83 mL/min/{1.73_m2} (ref >59)
GFR calc non Af Amer: 72 mL/min/{1.73_m2} (ref >59)
Potassium: 4.6 mmol/L (ref 3.5–5.2)
Total Bilirubin: 1.1 mg/dL (ref 0.0–1.2)

## 2012-07-30 MED ORDER — HYDROCOD POLST-CHLORPHEN POLST 10-8 MG/5ML PO LQCR: ORAL | Status: DC

## 2012-07-30 NOTE — Progress Notes (Signed)
  Subjective:    Patient ID: Brent Griffith, male    DOB: 03-20-1936, 76 y.o.   MRN: [id number]  HPI The patient was seen in the emergency room 07/10/12 for an acute sinus infection. He was treated appropriately with antibiotics and has recovered.  Problems of hypertension, obstructive sleep apnea, coronary artery disease, and GERD are all stable to improved.  He complains of a pruritic sensation at, ankles, and behind the knees. He uses a moisturizing cream which is helpful. There is no associated rash.   Review of Systems    Review of Systems - History obtained from chart review and the patient General ROS: positive for  - weight gain negative for - chills, fever, hot flashes or malaise Psychological ROS: positive for - memory difficulties negative for - anxiety, behavioral disorder, depression, disorientation, hallucinations, hostility, mood swings or obsessive thoughts ENT ROS: positive for - Recent sinus infection negative for - headaches, nasal discharge, vertigo or visual changes Allergy and Immunology ROS: negative Endocrine ROS: negative Respiratory ROS: no cough, shortness of breath, or wheezing Cardiovascular ROS: no chest pain or dyspnea on exertion Gastrointestinal ROS: no abdominal pain, change in bowel habits, or black or bloody stools Genito-Urinary ROS: no dysuria, trouble voiding, or hematuria Musculoskeletal ROS: negative for - gait disturbance or muscular weakness Objective:   Physical Exam Physical Examination: General appearance - alert, well appearing, and in no distress, oriented to person, place, and time, overweight and playful, active Mental status - alert, oriented to person, place, and time, normal mood, behavior, speech, dress, motor activity, and thought processes Ears - bilateral TM's and external ear canals normal Nose - normal and patent, no erythema, discharge or polyps Mouth - mucous membranes moist, pharynx normal without lesions Neck - supple, no  significant adenopathy Lymphatics - no palpable lymphadenopathy, no hepatosplenomegaly Chest - clear to auscultation, no wheezes, rales or rhonchi, symmetric air entry Heart - normal rate, regular rhythm, normal S1, S2, no murmurs, rubs, clicks or gallops Abdomen - soft, nontender, nondistended, no masses or organomegaly Back exam - full range of motion, no tenderness, palpable spasm or pain on motion Neurological - alert, oriented, normal speech, no focal findings or movement disorder noted Musculoskeletal - no joint tenderness, deformity or swelling Extremities - peripheral pulses normal, no pedal edema, no clubbing or cyanosis       Assessment & Plan:  Plan to return in 4 months for routine evaluation.

## 2012-07-30 NOTE — Patient Instructions (Signed)
Continue current meds 

## 2012-11-28 ENCOUNTER — Other Ambulatory Visit: Payer: BC Managed Care – PPO

## 2012-11-28 ENCOUNTER — Other Ambulatory Visit: Payer: Self-pay | Admitting: Internal Medicine

## 2012-12-02 ENCOUNTER — Ambulatory Visit (INDEPENDENT_AMBULATORY_CARE_PROVIDER_SITE_OTHER): Payer: BC Managed Care – PPO | Admitting: Internal Medicine

## 2012-12-02 VITALS — BP 130/90 | HR 54 | Temp 96.6°F | Resp 18 | Ht 65.0 in | Wt 156.0 lb

## 2012-12-02 DIAGNOSIS — E785 Hyperlipidemia, unspecified: Secondary | ICD-10-CM

## 2012-12-02 DIAGNOSIS — K219 Gastro-esophageal reflux disease without esophagitis: Secondary | ICD-10-CM

## 2012-12-02 DIAGNOSIS — I10 Essential (primary) hypertension: Secondary | ICD-10-CM

## 2012-12-02 DIAGNOSIS — Z87898 Personal history of other specified conditions: Secondary | ICD-10-CM

## 2012-12-02 DIAGNOSIS — I2510 Atherosclerotic heart disease of native coronary artery without angina pectoris: Secondary | ICD-10-CM

## 2012-12-02 NOTE — Progress Notes (Signed)
Subjective:    Patient ID: Brent Griffith, male    DOB: 1936/01/10, 77 y.o.   MRN: [id number]  HPI HYPERTENSION: controlled  Other and unspecified hyperlipidemia: controlled  Coronary atherosclerosis of native coronary artery: no angina or palpitations  G E R D: asymptomatic  BENIGN PROSTATIC HYPERTROPHY, HX OF: Dr. Patsi Sears said he can stop the Avodart. He remains without significant symptoms.     Current Outpatient Prescriptions on File Prior to Visit  Medication Sig Dispense Refill  . aspirin 325 MG EC tablet Take 325 mg by mouth daily.        Marland Kitchen atenolol (TENORMIN) 50 MG tablet Take 50 mg by mouth daily.        . Calcium Citrate 250 MG TABS Take 1 tablet by mouth daily.       . chlorpheniramine-HYDROcodone (TUSSIONEX PENNKINETIC ER) 10-8 MG/5ML LQCR 5 cc every 12 hours if needed to control cough  180 mL  0  . cholecalciferol (VITAMIN D) 1000 UNITS tablet Take 1,000 Units by mouth daily.        . Coenzyme Q10 (COQ10) 100 MG CAPS Take by mouth daily.        Marland Kitchen denosumab (PROLIA) 60 MG/ML SOLN injection Inject 60 mg into the skin every 6 (six) months. Administer in upper arm, thigh, or abdomen.  Patient receives this injection every 6 months.      . doxazosin (CARDURA) 4 MG tablet Take 4 mg by mouth at bedtime.        . Glucosamine 500 MG CAPS Take 1,000 mg by mouth daily.       Marland Kitchen levocetirizine (XYZAL) 5 MG tablet Take 5 mg by mouth every evening.        . methocarbamol (ROBAXIN) 750 MG tablet Take 750 mg by mouth as needed. For muscle spasms      . Multiple Vitamin (MULTIVITAMIN) tablet Take 1 tablet by mouth daily.        Marland Kitchen omeprazole (PRILOSEC) 40 MG capsule Take 40 mg by mouth daily.        . Probiotic Product (PROBIOTIC DAILY PO) Take 1 capsule by mouth daily.      . rosuvastatin (CRESTOR) 20 MG tablet Take 10 mg by mouth daily.        No current facility-administered medications on file prior to visit.    Review of Systems  Constitutional: Negative.   HENT: Negative.    Eyes: Negative.   Respiratory: Negative.   Cardiovascular: Negative for chest pain, palpitations and leg swelling.  Gastrointestinal: Negative.   Endocrine: Negative.   Genitourinary: Negative.   Musculoskeletal: Negative.   Skin:       Poison ivy on the right wrist.  Allergic/Immunologic: Negative.   Neurological: Negative.   Hematological: Negative.   Psychiatric/Behavioral: Negative.        Objective:BP 130/90  Pulse 54  Temp(Src) 96.6 F (35.9 C) (Oral)  Resp 18  Ht 5\' 5"  (1.651 m)  Wt 156 lb (70.761 kg)  BMI 25.96 kg/m2  SpO2 97%    Physical Exam  Constitutional: He is oriented to person, place, and time. He appears well-developed and well-nourished. No distress.  HENT:  Head: Atraumatic.  Right Ear: External ear normal.  Left Ear: External ear normal.  Nose: Nose normal.  Eyes: Conjunctivae and EOM are normal. Pupils are equal, round, and reactive to light.  Neck: Normal range of motion. Neck supple. No JVD present. No tracheal deviation present. No thyromegaly present.  Cardiovascular: Normal rate, regular  rhythm, normal heart sounds and intact distal pulses.  Exam reveals no gallop and no friction rub.   No murmur heard. Pulmonary/Chest: Effort normal and breath sounds normal. No respiratory distress. He has no wheezes. He has no rales. He exhibits no tenderness.  Abdominal: Soft. Bowel sounds are normal. He exhibits no distension and no mass. There is no tenderness. There is no rebound.  Musculoskeletal: Normal range of motion. He exhibits no edema and no tenderness.  Lymphadenopathy:    He has no cervical adenopathy.  Neurological: He is alert and oriented to person, place, and time. He has normal reflexes. No cranial nerve deficit. Coordination normal.  Skin: Skin is warm and dry. No rash noted. No erythema. No pallor.  Psychiatric: He has a normal mood and affect. His behavior is normal. Judgment and thought content normal.      No visits with results  within 2 Month(s) from this visit. Latest known visit with results is:  Appointment on 07/28/2012  Component Date Value Range Status  . Glucose 07/28/2012 103* 65 - 99 mg/dL Final  . BUN 16/02/9603 15  8 - 27 mg/dL Final  . Creatinine, Ser 07/28/2012 1.02  0.76 - 1.27 mg/dL Final  . GFR calc non Af Amer 07/28/2012 72  >59 mL/min/1.73 Final  . GFR calc Af Amer 07/28/2012 83  >59 mL/min/1.73 Final  . BUN/Creatinine Ratio 07/28/2012 15  10 - 22 Final  . Sodium 07/28/2012 140  134 - 144 mmol/L Final  . Potassium 07/28/2012 4.6  3.5 - 5.2 mmol/L Final  . Chloride 07/28/2012 103  97 - 108 mmol/L Final  . CO2 07/28/2012 24  19 - 28 mmol/L Final  . Calcium 07/28/2012 8.9  8.6 - 10.2 mg/dL Final  . Total Protein 07/28/2012 6.6  6.0 - 8.5 g/dL Final  . Albumin 54/01/8118 4.5  3.5 - 4.8 g/dL Final  . Globulin, Total 07/28/2012 2.1  1.5 - 4.5 g/dL Final  . Albumin/Globulin Ratio 07/28/2012 2.1  1.1 - 2.5 Final  . Total Bilirubin 07/28/2012 1.1  0.0 - 1.2 mg/dL Final  . Alkaline Phosphatase 07/28/2012 43  39 - 117 IU/L Final  . AST 07/28/2012 19  0 - 40 IU/L Final  . ALT 07/28/2012 16  0 - 44 IU/L Final  . WBC 07/28/2012 6.5  3.4 - 10.8 x10E3/uL Final  . RBC 07/28/2012 4.75  4.14 - 5.80 x10E6/uL Final  . Hemoglobin 07/28/2012 14.5  12.6 - 17.7 g/dL Final  . HCT 14/78/2956 42.1  37.5 - 51.0 % Final  . MCV 07/28/2012 89  79 - 97 fL Final  . MCH 07/28/2012 30.5  26.6 - 33.0 pg Final  . MCHC 07/28/2012 34.4  31.5 - 35.7 g/dL Final  . RDW 21/30/8657 13.2  12.3 - 15.4 % Final  . Neutrophils Relative % 07/28/2012 61  40 - 74 % Final  . Lymphs 07/28/2012 28  14 - 46 % Final  . Monocytes 07/28/2012 7  4 - 12 % Final  . Eos 07/28/2012 3  0 - 5 % Final  . Basos 07/28/2012 1  0 - 3 % Final  . Neutrophils Absolute 07/28/2012 4.0  1.4 - 7.0 x10E3/uL Final  . Lymphocytes Absolute 07/28/2012 1.8  0.7 - 3.1 x10E3/uL Final  . Monocytes Absolute 07/28/2012 0.4  0.1 - 0.9 x10E3/uL Final  . Eosinophils  Absolute 07/28/2012 0.2  0.0 - 0.4 x10E3/uL Final  . Basophils Absolute 07/28/2012 0.0  0.0 - 0.2 x10E3/uL Final  . Immature  Granulocytes 07/28/2012 0  0 - 2 % Final  . Immature Grans (Abs) 07/28/2012 0.0  0.0 - 0.1 x10E3/uL Final  . Hemoglobin A1C 07/28/2012 5.6  4.8 - 5.6 % Final   Comment:          Increased risk for diabetes: 5.7 - 6.4                                   Diabetes: >6.4                                   Glycemic control for adults with diabetes: <7.0  . Estimated average glucose 07/28/2012 114   Final   11/28/12 CMP; normal  Lipids: TC 119, trig 52, HDL 65, LDL 52    Assessment & Plan:  HYPERTENSION: continue current medication  Other and unspecified hyperlipidemia:: continue current medication  Coronary atherosclerosis of native coronary artery: continue current medication  G E R D: continue current medication  BENIGN PROSTATIC HYPERTROPHY, HX OF: continue current medication

## 2012-12-02 NOTE — Patient Instructions (Signed)
Try 1% hydrocortisone on the rash twice daily

## 2012-12-05 LAB — LIPID PANEL WITH LDL/HDL RATIO
Cholesterol, Total: 119 mg/dL (ref 100–199)
VLDL Cholesterol Cal: 13 mg/dL (ref 5–40)

## 2012-12-05 LAB — COMPREHENSIVE METABOLIC PANEL
ALT: 19 IU/L (ref 0–44)
Albumin/Globulin Ratio: 1.7 (ref 1.1–2.5)
Alkaline Phosphatase: 40 IU/L (ref 39–117)
BUN/Creatinine Ratio: 18 (ref 10–22)
Chloride: 104 mmol/L (ref 97–108)
GFR calc Af Amer: 88 mL/min/{1.73_m2} (ref >59)
GFR calc non Af Amer: 76 mL/min/{1.73_m2} (ref >59)
Glucose: 94 mg/dL (ref 65–99)
Potassium: 4.4 mmol/L (ref 3.5–5.2)
Sodium: 140 mmol/L (ref 134–144)
Total Bilirubin: 1.7 mg/dL — ABNORMAL HIGH (ref 0.0–1.2)

## 2013-01-18 ENCOUNTER — Other Ambulatory Visit: Payer: Self-pay | Admitting: Internal Medicine

## 2013-03-31 ENCOUNTER — Other Ambulatory Visit: Payer: Self-pay | Admitting: Internal Medicine

## 2013-04-07 ENCOUNTER — Ambulatory Visit (INDEPENDENT_AMBULATORY_CARE_PROVIDER_SITE_OTHER): Payer: BC Managed Care – PPO | Admitting: Internal Medicine

## 2013-04-07 ENCOUNTER — Encounter: Payer: Self-pay | Admitting: Internal Medicine

## 2013-04-07 VITALS — BP 126/66 | HR 63 | Temp 95.7°F | Wt 158.0 lb

## 2013-04-07 DIAGNOSIS — I10 Essential (primary) hypertension: Secondary | ICD-10-CM

## 2013-04-07 DIAGNOSIS — K219 Gastro-esophageal reflux disease without esophagitis: Secondary | ICD-10-CM

## 2013-04-07 DIAGNOSIS — I2510 Atherosclerotic heart disease of native coronary artery without angina pectoris: Secondary | ICD-10-CM

## 2013-04-07 DIAGNOSIS — K625 Hemorrhage of anus and rectum: Secondary | ICD-10-CM

## 2013-04-07 DIAGNOSIS — E785 Hyperlipidemia, unspecified: Secondary | ICD-10-CM

## 2013-04-07 DIAGNOSIS — Z87898 Personal history of other specified conditions: Secondary | ICD-10-CM

## 2013-04-07 NOTE — Patient Instructions (Signed)
Call Dr. Jens Som for appointment. Schedule prostate surgery. We will schedule a GI appt for colonoscopy.

## 2013-04-07 NOTE — Progress Notes (Signed)
Subjective:    Patient ID: Brent Griffith, male    DOB: October 21, 1935, 77 y.o.   MRN: [id number]  Chief Complaint  Patient presents with  . Medical Managment of Chronic Issues    4 month f/u   . other    colonoscopy with [place] 2004, needs to discuss surgery from another provider.  Marland Kitchen other    bright red blood in stool occassionaly    HPI BENIGN PROSTATIC HYPERTROPHY, HX OF: has had prostatectomy recommended by urologist. Has hesitation, nocturia, dribbling, and occasional incontinence.  Coronary atherosclerosis of native coronary artery; no recent angina  Last colonoscopy about 2004. Recently had blood in the toilet and on paper. No rectal pain. G E R D: asymptomatic  HYPERTENSION: controlled  Other and unspecified hyperlipidemia; no recent lab    Current Outpatient Prescriptions on File Prior to Visit  Medication Sig Dispense Refill  . aspirin 325 MG EC tablet Take 325 mg by mouth daily.        Marland Kitchen atenolol (TENORMIN) 50 MG tablet TAKE 1 TABLET BY MOUTH EVERY DAY TO CONTROL HEART RHYTHM AND LOWER BLOOD PRESSURE  90 tablet  3  . Calcium Citrate 250 MG TABS Take 1 tablet by mouth daily.       . chlorpheniramine-HYDROcodone (TUSSIONEX PENNKINETIC ER) 10-8 MG/5ML LQCR 5 cc every 12 hours if needed to control cough  180 mL  0  . cholecalciferol (VITAMIN D) 1000 UNITS tablet Take 1,000 Units by mouth daily.        . Coenzyme Q10 (COQ10) 100 MG CAPS Take by mouth daily.        Marland Kitchen denosumab (PROLIA) 60 MG/ML SOLN injection Inject 60 mg into the skin every 6 (six) months. Administer in upper arm, thigh, or abdomen.  Patient receives this injection every 6 months.      . doxazosin (CARDURA) 4 MG tablet Take 4 mg by mouth at bedtime.        . Glucosamine 500 MG CAPS Take 1,000 mg by mouth daily.       Marland Kitchen levocetirizine (XYZAL) 5 MG tablet Take 5 mg by mouth every evening.        . methocarbamol (ROBAXIN) 750 MG tablet Take 750 mg by mouth as needed. For muscle spasms      . Multiple Vitamin  (MULTIVITAMIN) tablet Take 1 tablet by mouth daily.        Marland Kitchen omeprazole (PRILOSEC) 40 MG capsule Take 40 mg by mouth daily.        . Potassium 99 MG TABS Take 99 mg by mouth daily.      . Probiotic Product (PROBIOTIC DAILY PO) Take 1 capsule by mouth daily.      . rosuvastatin (CRESTOR) 20 MG tablet Take 10 mg by mouth daily.        No current facility-administered medications on file prior to visit.    Review of Systems  Constitutional: Negative.   HENT: Negative.   Eyes: Negative.   Respiratory: Negative.        Wears CPAP. Followed by Dr. Maple Hudson.  Cardiovascular: Negative for chest pain, palpitations and leg swelling.  Gastrointestinal: Negative.   Endocrine: Negative.   Genitourinary:       No dysuria.. Known BPH. Has had surgery recommended by Dr. Patsi Sears, urologist.Hesitation, nocturia, dribbling, and occasional incontinence.  Musculoskeletal: Negative.   Skin: Negative.   Allergic/Immunologic: Negative.   Neurological: Negative.   Hematological: Negative.   Psychiatric/Behavioral: Negative.  Objective:BP 126/66  Pulse 63  Temp(Src) 95.7 F (35.4 C) (Oral)  Wt 158 lb (71.668 kg)  SpO2 99%    Physical Exam  Constitutional: He is oriented to person, place, and time. He appears well-developed and well-nourished. No distress.  HENT:  Head: Atraumatic.  Right Ear: External ear normal.  Left Ear: External ear normal.  Nose: Nose normal.  Eyes: Conjunctivae and EOM are normal. Pupils are equal, round, and reactive to light.  Neck: Normal range of motion. Neck supple. No JVD present. No tracheal deviation present. No thyromegaly present.  Cardiovascular: Normal rate, regular rhythm, normal heart sounds and intact distal pulses.  Exam reveals no gallop and no friction rub.   No murmur heard. Pulmonary/Chest: Effort normal and breath sounds normal. No respiratory distress. He has no wheezes. He has no rales. He exhibits no tenderness.  Abdominal: Soft. Bowel sounds  are normal. He exhibits no distension and no mass. There is no tenderness. There is no rebound.  Musculoskeletal: Normal range of motion. He exhibits no edema and no tenderness.  Lymphadenopathy:    He has no cervical adenopathy.  Neurological: He is alert and oriented to person, place, and time. He has normal reflexes. No cranial nerve deficit. Coordination normal.  Skin: Skin is warm and dry. No rash noted. No erythema. No pallor.  Psychiatric: He has a normal mood and affect. His behavior is normal. Judgment and thought content normal.          Assessment & Plan:  1. BENIGN PROSTATIC HYPERTROPHY, HX OF Recommend he schedule surgery. Should see cardiologist prior to surgery  2. Coronary atherosclerosis of native coronary artery asymptomatic  3. G E R D asymptomatic  4. HYPERTENSION controlled  5. Other and unspecified hyperlipidemia No recent lab  6. Rectal bleeding Refer to Gi for colonoscopy.

## 2013-04-09 ENCOUNTER — Ambulatory Visit (INDEPENDENT_AMBULATORY_CARE_PROVIDER_SITE_OTHER): Payer: BC Managed Care – PPO | Admitting: Internal Medicine

## 2013-04-09 ENCOUNTER — Other Ambulatory Visit: Payer: Self-pay | Admitting: *Deleted

## 2013-04-09 ENCOUNTER — Encounter: Payer: Self-pay | Admitting: Internal Medicine

## 2013-04-09 VITALS — BP 122/80 | HR 60 | Ht 67.0 in | Wt 158.2 lb

## 2013-04-09 DIAGNOSIS — Z23 Encounter for immunization: Secondary | ICD-10-CM

## 2013-04-09 DIAGNOSIS — G4710 Hypersomnia, unspecified: Secondary | ICD-10-CM

## 2013-04-09 NOTE — Patient Instructions (Addendum)
Order- [facility]- reduce autopap range to  6-12 Please call as needed  Pneumonia vaccine conjugate 13

## 2013-04-09 NOTE — Progress Notes (Signed)
Subjective:    Patient ID: Brent Griffith, male    DOB: Sep 04, 1935, 77 y.o.   MRN: [id number]  HPI 12/08/10- 77 year old male never smoker, followed for obstructive sleep apnea, periodic limb movement, CAD/ CABG // Dr [place] Callas for allergic asthma Last here- November 10, 2009- note reviewed CPAP comfortable, used on trips and all night every night-AutoPAP Advanced, full face mask. Wife likes it because it prevents snoring. Feels well rested. Usual bedtime and up around 5- he accepts responsibility to get enough sleep. Denies EDS driving or etc. No needs for changes. He gets supplies ok. We discussed mask options. Heart surgery Nov 2010 CABG 4v  03/17/12- 77 year old male never smoker, followed for obstructive sleep apnea, periodic limb movement, CAD/ CABG // Dr [place] Callas for allergic asthma  Wears CPAP AutoPap/ Advanced nightly 6-8hrs during week and 10-12 hours on the weekends. Denies problems with pressure and mask. Sometimes pressure is too high or too low Family tells him he will snore through very rarely.  04/09/13- 77 year old male never smoker, followed for obstructive sleep apnea, periodic limb movement, CAD/ CABG // Dr [place] Callas for allergic asthma  Wife here. FOLLOWS FOR:  Wear CPAP 6-8 hours per night.  Discuss getting CPAP settings change Good compliance and control, but he would like to try a lower pressure especially to start at night. Using autoPAP 8-15. Pending prostatectomy.  Review of Systems- see HPI Constitutional:   No-   weight loss, night sweats, fevers, chills, fatigue, lassitude. HEENT:   No-   headaches, difficulty swallowing, tooth/dental problems, sore throat,                  No-   sneezing, itching, ear ache, nasal congestion, post nasal drip,  CV:  No-   chest pain, orthopnea, PND, swelling in lower extremities, anasarca,dizziness, palpitations GI:  No-   heartburn, indigestion, abdominal pain, nausea, vomiting,  Resp: No-   shortness of breath with exertion or at rest.  No-   excess mucus,             No-   productive cough,  No non-productive cough,  No-  coughing up of blood.              No-   change in color of mucus.  No- wheezing.   Skin: No-   rash or lesions. GU: . MS:  No-   joint pain or swelling. Marland Kitchen Psych:  No- change in mood or affect. No depression or anxiety.  No memory loss.    Objective:   Physical Exam BP 122/80  Pulse 60  Ht 5\' 7"  (1.702 m)  Wt 158 lb 3.2 oz (71.759 kg)  BMI 24.77 kg/m2  SpO2 99%  General- Alert, Oriented, Affect-appropriate, Distress- none acute. Medium build. Skin- rash-none, lesions- none, excoriation- none Lymphadenopathy- none Head- atraumatic            Eyes- Gross vision intact, PERRLA, conjunctivae clear secretions            Ears- Hearing, canals normal            Nose- Clear, +Septal dev/ external nasal deviation,  No-mucus, polyps, erosion, perforation             Throat- Mallampati II-III , mucosa clear , drainage- none, tonsils- atrophic Neck- flexible , trachea midline, no stridor , thyroid nl, carotid no bruit Chest - symmetrical excursion , unlabored           Heart/CV- RRR , no murmur ,  no gallop  , no rub, nl s1 s2                           - JVD- none , edema- none, stasis changes- none, varices- none           Lung- clear to P&A, wheeze- none, cough- none , dullness-none, rub- none           Chest wall-  Abd-  Br/ Gen/ Rectal- Not done, not indicated Extrem- cyanosis- none, clubbing, none, atrophy- none, strength- nl Neuro- grossly intact to observation    Assessment & Plan:

## 2013-04-13 ENCOUNTER — Other Ambulatory Visit: Payer: Self-pay | Admitting: *Deleted

## 2013-04-15 ENCOUNTER — Other Ambulatory Visit: Payer: Self-pay | Admitting: *Deleted

## 2013-04-15 MED ORDER — ACETAMINOPHEN-CODEINE #3 300-30 MG PO TABS: ORAL_TABLET | ORAL | Status: DC

## 2013-04-22 ENCOUNTER — Emergency Department (HOSPITAL_BASED_OUTPATIENT_CLINIC_OR_DEPARTMENT_OTHER)
Admission: EM | Admit: 2013-04-22 | Discharge: 2013-04-22 | Disposition: A | Discharge status: Home / Self Care | Payer: BC Managed Care – PPO | Attending: Emergency Medicine | Admitting: Emergency Medicine

## 2013-04-22 ENCOUNTER — Encounter (HOSPITAL_BASED_OUTPATIENT_CLINIC_OR_DEPARTMENT_OTHER): Payer: Self-pay | Admitting: Emergency Medicine

## 2013-04-22 ENCOUNTER — Emergency Department (HOSPITAL_BASED_OUTPATIENT_CLINIC_OR_DEPARTMENT_OTHER): Payer: BC Managed Care – PPO

## 2013-04-22 DIAGNOSIS — Z951 Presence of aortocoronary bypass graft: Secondary | ICD-10-CM | POA: Insufficient documentation

## 2013-04-22 DIAGNOSIS — Z8669 Personal history of other diseases of the nervous system and sense organs: Secondary | ICD-10-CM | POA: Insufficient documentation

## 2013-04-22 DIAGNOSIS — Z862 Personal history of diseases of the blood and blood-forming organs and certain disorders involving the immune mechanism: Secondary | ICD-10-CM | POA: Insufficient documentation

## 2013-04-22 DIAGNOSIS — J069 Acute upper respiratory infection, unspecified: Secondary | ICD-10-CM

## 2013-04-22 DIAGNOSIS — M810 Age-related osteoporosis without current pathological fracture: Secondary | ICD-10-CM | POA: Insufficient documentation

## 2013-04-22 DIAGNOSIS — Z79899 Other long term (current) drug therapy: Secondary | ICD-10-CM | POA: Insufficient documentation

## 2013-04-22 DIAGNOSIS — N400 Enlarged prostate without lower urinary tract symptoms: Secondary | ICD-10-CM | POA: Insufficient documentation

## 2013-04-22 DIAGNOSIS — I10 Essential (primary) hypertension: Secondary | ICD-10-CM | POA: Insufficient documentation

## 2013-04-22 DIAGNOSIS — Z7982 Long term (current) use of aspirin: Secondary | ICD-10-CM | POA: Insufficient documentation

## 2013-04-22 DIAGNOSIS — Z8619 Personal history of other infectious and parasitic diseases: Secondary | ICD-10-CM | POA: Insufficient documentation

## 2013-04-22 DIAGNOSIS — E559 Vitamin D deficiency, unspecified: Secondary | ICD-10-CM | POA: Insufficient documentation

## 2013-04-22 DIAGNOSIS — Z8711 Personal history of peptic ulcer disease: Secondary | ICD-10-CM | POA: Insufficient documentation

## 2013-04-22 DIAGNOSIS — Z95818 Presence of other cardiac implants and grafts: Secondary | ICD-10-CM | POA: Insufficient documentation

## 2013-04-22 DIAGNOSIS — J45909 Unspecified asthma, uncomplicated: Secondary | ICD-10-CM | POA: Insufficient documentation

## 2013-04-22 DIAGNOSIS — I2510 Atherosclerotic heart disease of native coronary artery without angina pectoris: Secondary | ICD-10-CM | POA: Insufficient documentation

## 2013-04-22 DIAGNOSIS — E785 Hyperlipidemia, unspecified: Secondary | ICD-10-CM | POA: Insufficient documentation

## 2013-04-22 DIAGNOSIS — K219 Gastro-esophageal reflux disease without esophagitis: Secondary | ICD-10-CM | POA: Insufficient documentation

## 2013-04-22 MED ORDER — AZITHROMYCIN 250 MG PO TABS: 250.0000 mg | ORAL_TABLET | Freq: Every day | ORAL | Status: DC

## 2013-04-22 NOTE — ED Notes (Signed)
Cough and sore throat x 1 week. Denies fever

## 2013-04-22 NOTE — ED Notes (Signed)
Patient transported to X-ray 

## 2013-04-22 NOTE — ED Provider Notes (Signed)
CSN: [id number]     Arrival date & time 04/22/13  1702 History   First MD Initiated Contact with Patient 04/22/13 1718     Chief Complaint  Patient presents with  . Cough   (Consider location/radiation/quality/duration/timing/severity/associated sxs/prior Treatment) HPI Comments: Patient is a 77 year old male with history of coronary artery disease status post bypass surgery in the past. He presents today with complaints of a one-week history of cough and sore throat. His cough has been intermittently productive of a yellow sputum he denies any chest pain or shortness of breath. He denies having any fevers or chills.  Patient is a 77 y.o. male presenting with cough. The history is provided by the patient.  Cough Cough characteristics:  Non-productive Severity:  Moderate Onset quality:  Sudden Duration:  1 week Timing:  Constant Progression:  Worsening Chronicity:  New Smoker: no   Relieved by:  Nothing Worsened by:  Nothing tried Ineffective treatments:  Cough suppressants Associated symptoms: sore throat   Associated symptoms: no chest pain, no chills, no fever and no sinus congestion     Past Medical History  Diagnosis Date  . Unspecified essential hypertension   . Esophageal reflux   . Other extrapyramidal disease and abnormal movement disorder   . Hypersomnia with sleep apnea, unspecified   . Unspecified sleep apnea   . Asthma   . Hyperlipidemia   . CAD (coronary artery disease)   . Enlarged prostate   . Kyphosis   . Acute upper respiratory infections of unspecified site   . Trigger finger (acquired)   . Anemia, unspecified   . Lumbago   . Myalgia and myositis, unspecified   . Pain in limb   . Pain in limb   . Other abnormal blood chemistry   . Palpitations   . Coronary atherosclerosis of unspecified type of vessel, native or graft   . Coronary atherosclerosis of unspecified type of vessel, native or graft   . Pain in joint, shoulder region   . Senile  osteoporosis   . Other malaise and fatigue   . Unspecified vitamin D deficiency   . Pain in thoracic spine   . Other symptoms involving cardiovascular system   . Urinary frequency   . Other and unspecified hyperlipidemia   . Reflux esophagitis   . Special screening for malignant neoplasm of prostate   . Unspecified essential hypertension   . Elevated prostate specific antigen (PSA)   . Hypertrophy of prostate with urinary obstruction and other lower urinary tract symptoms (LUTS)   . Herpes zoster without mention of complication   . Acute gastric ulcer without mention of hemorrhage, perforation, or obstruction   . Nodular prostate without urinary obstruction   . Other specified disorders of rotator cuff syndrome of shoulder and allied disorders   . Scoliosis (and kyphoscoliosis), idiopathic   . Obstructive sleep apnea (adult) (pediatric)   . Impotence of organic origin   . Allergic rhinitis, cause unspecified   . Extrinsic asthma, unspecified   . Unspecified tinnitus   . Diaphragmatic hernia without mention of obstruction or gangrene    Past Surgical History  Procedure Laterality Date  . Coronary artery bypass graft    . Tonsillectomy  1945  . Shoulder surgery Left 04/1998    Dr Madelon Lips  . Mole removal  1958  . Cardiac catheterization  03/04/2009    Dr Dorris Fetch   Family History  Problem Relation Age of Onset  . Stroke Mother   . Hypertension Father   .  Stroke Father   . Heart disease Father   . Heart disease Paternal Grandfather    History  Substance Use Topics  . Smoking status: Never Smoker   . Smokeless tobacco: Not on file  . Alcohol Use: No    Review of Systems  Constitutional: Negative for fever and chills.  HENT: Positive for sore throat.   Respiratory: Positive for cough.   Cardiovascular: Negative for chest pain.  All other systems reviewed and are negative.    Allergies  Compazine  Home Medications   Current Outpatient Rx  Name  Route  Sig   Dispense  Refill  . acetaminophen-codeine (TYLENOL #3) 300-30 MG per tablet      Take one tablet by mouth up to four times daily as needed for pain   100 tablet   0   . aspirin 325 MG EC tablet   Oral   Take 325 mg by mouth daily.           Marland Kitchen atenolol (TENORMIN) 50 MG tablet      TAKE 1 TABLET BY MOUTH EVERY DAY TO CONTROL HEART RHYTHM AND LOWER BLOOD PRESSURE   90 tablet   3   . Calcium Citrate 250 MG TABS   Oral   Take 1 tablet by mouth daily.          . chlorpheniramine-HYDROcodone (TUSSIONEX PENNKINETIC ER) 10-8 MG/5ML LQCR      5 cc every 12 hours if needed to control cough   180 mL   0   . cholecalciferol (VITAMIN D) 1000 UNITS tablet   Oral   Take 1,000 Units by mouth daily.           . Coenzyme Q10 (COQ10) 100 MG CAPS   Oral   Take by mouth daily.           Marland Kitchen denosumab (PROLIA) 60 MG/ML SOLN injection   Subcutaneous   Inject 60 mg into the skin every 6 (six) months. Administer in upper arm, thigh, or abdomen.  Patient receives this injection every 6 months.         . doxazosin (CARDURA) 4 MG tablet   Oral   Take 4 mg by mouth at bedtime.           Randye Lobo HCL PO   Oral   Take 5 mg by mouth. Take 1 tablet by mouth in the morning to help with prostrate.         . Glucosamine 500 MG CAPS   Oral   Take 1,000 mg by mouth daily.          Marland Kitchen levocetirizine (XYZAL) 5 MG tablet   Oral   Take 5 mg by mouth every evening.           . methocarbamol (ROBAXIN) 750 MG tablet   Oral   Take 750 mg by mouth as needed. For muscle spasms         . Multiple Vitamin (MULTIVITAMIN) tablet   Oral   Take 1 tablet by mouth daily.           Marland Kitchen omeprazole (PRILOSEC) 40 MG capsule   Oral   Take 40 mg by mouth daily.           . Potassium 99 MG TABS   Oral   Take 99 mg by mouth daily.         . Probiotic Product (PROBIOTIC DAILY PO)   Oral   Take 1 capsule by mouth  daily.         . rosuvastatin (CRESTOR) 20 MG tablet    Oral   Take 10 mg by mouth daily.           BP 129/76  Pulse 67  Temp(Src) 98.8 F (37.1 C) (Oral)  Resp 18  Ht 5\' 5"  (1.651 m)  Wt 155 lb (70.308 kg)  BMI 25.79 kg/m2  SpO2 98% Physical Exam  Nursing note and vitals reviewed. Constitutional: He is oriented to person, place, and time. He appears well-developed and well-nourished. No distress.  HENT:  Head: Normocephalic and atraumatic.  Mouth/Throat: Oropharynx is clear and moist.  Neck: Normal range of motion. Neck supple.  Cardiovascular: Normal rate, regular rhythm and normal heart sounds.   No murmur heard. Pulmonary/Chest: Effort normal and breath sounds normal. No respiratory distress. He has no wheezes.  Abdominal: Soft. Bowel sounds are normal. He exhibits no distension. There is no tenderness.  Musculoskeletal: Normal range of motion. He exhibits no edema.  Lymphadenopathy:    He has no cervical adenopathy.  Neurological: He is alert and oriented to person, place, and time.  Skin: Skin is warm and dry. He is not diaphoretic.    ED Course  Procedures (including critical care time) Labs Review Labs Reviewed - No data to display Imaging Review No results found.    MDM  No diagnosis found. The patient is a 77 year old male who presents with productive cough over the past week. He has a history of coronary artery disease, however he is having no chest pain, no leg swelling, and I have no suspicion this is cardiac and do not feel as though further workup is indicated into this.. This sounds viral in nature, however do to the length of illness and comorbidities I will prescribe a Z-Pak which he is to fill if his symptoms worsen or do not improve in the next 2 days. His chest x-ray does not reveal pneumonia or other concerning findings.  Geoffery Lyons, MD 04/22/13 [phone]

## 2013-05-01 ENCOUNTER — Encounter: Payer: Self-pay | Admitting: Cardiology

## 2013-05-01 ENCOUNTER — Encounter: Payer: Self-pay | Admitting: *Deleted

## 2013-05-01 ENCOUNTER — Ambulatory Visit (INDEPENDENT_AMBULATORY_CARE_PROVIDER_SITE_OTHER): Payer: BC Managed Care – PPO | Admitting: Cardiology

## 2013-05-01 VITALS — BP 141/68 | HR 57 | Ht 64.0 in | Wt 157.0 lb

## 2013-05-01 DIAGNOSIS — I2510 Atherosclerotic heart disease of native coronary artery without angina pectoris: Secondary | ICD-10-CM

## 2013-05-01 DIAGNOSIS — I10 Essential (primary) hypertension: Secondary | ICD-10-CM

## 2013-05-01 DIAGNOSIS — E785 Hyperlipidemia, unspecified: Secondary | ICD-10-CM

## 2013-05-01 DIAGNOSIS — Z01810 Encounter for preprocedural cardiovascular examination: Secondary | ICD-10-CM

## 2013-05-01 NOTE — Progress Notes (Signed)
HPI: 77 year old male for evaluation of coronary artery disease. An echocardiogram in 2010 showed an ejection fraction of 50-55% with apical and septal hypokinesis. A Myoview was therefore performed and showed severe anterior ischemia and an ejection fraction of 47%. He therefore underwent cardiac catheterization on March 04, 2009 and this revealed severe three-vessel coronary disease as well as an 80% left main. His ejection fraction was 50%. He ultimately underwent coronary artery bypass and graft on November 1 (left internal mammary artery to left anterior descending, sequential saphenous vein graft to ramus intermedius and obtuse marginal 2, saphenous vein graft to posterior descending. I have not seen him since June of 2011. Patient denies dyspnea on exertion, orthopnea, PND, pedal edema, claudication, palpitations, syncope or chest pain. He is having problems with hesitancy and is scheduled for prostatectomy by his report. We were asked to evaluate preoperatively.   Current Outpatient Prescriptions  Medication Sig Dispense Refill  . acetaminophen-codeine (TYLENOL #3) 300-30 MG per tablet Take one tablet by mouth up to four times daily as needed for pain  100 tablet  0  . aspirin 325 MG EC tablet Take 325 mg by mouth daily.        Marland Kitchen atenolol (TENORMIN) 50 MG tablet TAKE 1 TABLET BY MOUTH EVERY DAY TO CONTROL HEART RHYTHM AND LOWER BLOOD PRESSURE  90 tablet  3  . Calcium Citrate 250 MG TABS Take 1 tablet by mouth daily.       . chlorpheniramine-HYDROcodone (TUSSIONEX PENNKINETIC ER) 10-8 MG/5ML LQCR 5 cc every 12 hours if needed to control cough  180 mL  0  . cholecalciferol (VITAMIN D) 1000 UNITS tablet Take 1,000 Units by mouth daily.        . Coenzyme Q10 (COQ10) 100 MG CAPS Take by mouth daily.        Marland Kitchen denosumab (PROLIA) 60 MG/ML SOLN injection Inject 60 mg into the skin every 6 (six) months. Administer in upper arm, thigh, or abdomen.  Patient receives this injection every 6 months.       . doxazosin (CARDURA) 4 MG tablet Take 4 mg by mouth at bedtime.        Randye Lobo HCL PO Take 5 mg by mouth. Take 1 tablet by mouth in the morning to help with prostrate.      . Glucosamine 500 MG CAPS Take 1,000 mg by mouth daily.       . Multiple Vitamin (MULTIVITAMIN) tablet Take 1 tablet by mouth daily.        Marland Kitchen omeprazole (PRILOSEC) 40 MG capsule Take 40 mg by mouth daily.        . Potassium 99 MG TABS Take 99 mg by mouth daily.      . Probiotic Product (PROBIOTIC DAILY PO) Take 1 capsule by mouth daily.      . rosuvastatin (CRESTOR) 20 MG tablet Take 10 mg by mouth daily.        No current facility-administered medications for this visit.    Allergies  Allergen Reactions  . Compazine [Prochlorperazine Edisylate]     Past Medical History  Diagnosis Date  . Unspecified essential hypertension   . Esophageal reflux   . Hypersomnia with sleep apnea, unspecified   . Unspecified sleep apnea   . Asthma   . Hyperlipidemia   . CAD (coronary artery disease)   . Enlarged prostate   . Kyphosis   . Trigger finger (acquired)   . Anemia, unspecified   . Lumbago   .  Senile osteoporosis   . Unspecified vitamin D deficiency   . Elevated prostate specific antigen (PSA)   . Herpes zoster without mention of complication   . Acute gastric ulcer without mention of hemorrhage, perforation, or obstruction   . Obstructive sleep apnea (adult) (pediatric)   . Impotence of organic origin   . Allergic rhinitis, cause unspecified   . Extrinsic asthma, unspecified   . Diaphragmatic hernia without mention of obstruction or gangrene     Past Surgical History  Procedure Laterality Date  . Coronary artery bypass graft    . Tonsillectomy  1945  . Shoulder surgery Left 04/1998    Dr Madelon Lips  . Mole removal  1958  . Cardiac catheterization  03/04/2009    Dr Dorris Fetch    History   Social History  . Marital Status: Married    Spouse Name: N/A    Number of Children: N/A  .  Years of Education: N/A   Occupational History  . [employer]    Social History Main Topics  . Smoking status: Never Smoker   . Smokeless tobacco: Not on file  . Alcohol Use: No  . Drug Use: No  . Sexual Activity: Not on file   Other Topics Concern  . Not on file   Social History Narrative  . No narrative on file    Family History  Problem Relation Age of Onset  . Stroke Mother   . Hypertension Father   . Stroke Father   . Heart disease Father   . Heart disease Paternal Grandfather     ROS: hesitancy and frequency but no fevers or chills, productive cough, hemoptysis, dysphasia, odynophagia, melena, hematochezia, dysuria, hematuria, rash, seizure activity, orthopnea, PND, pedal edema, claudication. Remaining systems are negative.  Physical Exam:   Blood pressure 141/68, pulse 57, height 5\' 4"  (1.626 m), weight 157 lb (71.215 kg).  General:  Well developed/well nourished in NAD Skin warm/dry Patient not depressed No peripheral clubbing Back-normal HEENT-normal/normal eyelids Neck supple/normal carotid upstroke bilaterally; no bruits; no JVD; no thyromegaly chest - CTA/ normal expansion CV - RRR/normal S1 and S2; no murmurs, rubs or gallops;  PMI nondisplaced Abdomen -NT/ND, no HSM, no mass, + bowel sounds, no bruit 2+ femoral pulses, no bruits Ext-no edema, chords, 2+ DP Neuro-grossly nonfocal  ECG sinus rhythm at a rate of 57. Inferior lateral T-wave inversion. T-wave changes are new compared to previous.

## 2013-05-01 NOTE — Assessment & Plan Note (Signed)
Patient is scheduled for prostatectomy. He is not having significant symptoms the electrocardiogram shows new inferior lateral T-wave changes. Plan nuclear study for risk stratification preoperatively.

## 2013-05-01 NOTE — Patient Instructions (Addendum)
Your physician wants you to follow-up in: ONE YEAR WITH DR CRENSHAW You will receive a reminder letter in the mail two months in advance. If you don't receive a letter, please call our office to schedule the follow-up appointment.   Your physician has requested that you have a lexiscan myoview. For further information please visit [email or web address]. Please follow instruction sheet, as given.   

## 2013-05-01 NOTE — Assessment & Plan Note (Signed)
Continue statin. 

## 2013-05-01 NOTE — Assessment & Plan Note (Signed)
Continue aspirin and statin. 

## 2013-05-01 NOTE — Assessment & Plan Note (Signed)
Continue present blood pressure medications. 

## 2013-05-03 NOTE — Assessment & Plan Note (Addendum)
Good compliance and control. he would like to start with a lower pressure at night Plan-shift AutoPap range down to 6-12

## 2013-05-07 DIAGNOSIS — D126 Benign neoplasm of colon, unspecified: Secondary | ICD-10-CM

## 2013-05-07 HISTORY — DX: Benign neoplasm of colon, unspecified: D12.6

## 2013-05-11 ENCOUNTER — Ambulatory Visit (INDEPENDENT_AMBULATORY_CARE_PROVIDER_SITE_OTHER): Payer: BC Managed Care – PPO | Admitting: Gastroenterology

## 2013-05-11 ENCOUNTER — Encounter: Payer: Self-pay | Admitting: Gastroenterology

## 2013-05-11 VITALS — BP 120/62 | HR 60 | Ht 64.0 in | Wt 154.8 lb

## 2013-05-11 DIAGNOSIS — Z1211 Encounter for screening for malignant neoplasm of colon: Secondary | ICD-10-CM

## 2013-05-11 DIAGNOSIS — K921 Melena: Secondary | ICD-10-CM

## 2013-05-11 MED ORDER — PEG-KCL-NACL-NASULF-NA ASC-C 100 G PO SOLR: 1.0000 | Freq: Once | ORAL | Status: DC

## 2013-05-11 NOTE — Progress Notes (Addendum)
    History of Present Illness: This is a 78 year old male accompanied by his wife. He previously underwent colonoscopy by Dr. Jim Desanctis in 2005 showing hemorrhoids. Upper endoscopy by Dr. Lajoyce Corners in 2005 showed benign fundic gland polyps. He relates occasional scant amount of bright blood on the tissue paper for many years. These symptoms have not changed. Denies weight loss, abdominal pain, constipation, diarrhea, change in stool caliber, melena,  nausea, vomiting, dysphagia, reflux symptoms, chest pain.  Review of Systems: Pertinent positive and negative review of systems were noted in the above HPI section. All other review of systems were otherwise negative.  Current Medications, Allergies, Past Medical History, Past Surgical History, Family History and Social History were reviewed in [employer] record.  Physical Exam: General: Well developed , well nourished, no acute distress Head: Normocephalic and atraumatic Eyes:  sclerae anicteric, EOMI Ears: Normal auditory acuity Mouth: No deformity or lesions Neck: Supple, no masses or thyromegaly Lungs: Clear throughout to auscultation Heart: Regular rate and rhythm; no murmurs, rubs or bruits Abdomen: Soft, non tender and non distended. No masses, hepatosplenomegaly or hernias noted. Normal Bowel sounds Rectal: Deferred to colonoscopy Musculoskeletal: Symmetrical with no gross deformities  Skin: No lesions on visible extremities Pulses:  Normal pulses noted Extremities: No clubbing, cyanosis, edema or deformities noted Neurological: Alert oriented x 4, grossly nonfocal Cervical Nodes:  No significant cervical adenopathy Inguinal Nodes: No significant inguinal adenopathy Psychological:  Alert and cooperative. Normal mood and affect  Assessment and Recommendations:  1 Colorectal cancer screening, average risk. Minor, stable small volume hematochezia is likely secondary to a benign anorectal source such as hemorrhoids.  The risks, benefits, and alternatives to colonoscopy with possible biopsy and possible polypectomy were discussed with the patient and they consent to proceed.

## 2013-05-11 NOTE — Patient Instructions (Signed)
You have been scheduled for a colonoscopy with propofol. Please follow written instructions given to you at your visit today.  Please pick up your prep kit at the pharmacy within the next 1-3 days. If you use inhalers (even only as needed), please bring them with you on the day of your procedure. Your physician has requested that you go to www.[email or web address] and enter the access code given to you at your visit today. This web site gives a general overview about your procedure. However, you should still follow specific instructions given to you by our office regarding your preparation for the procedure.  Thank you for choosing me and [facility].  Malcolm T. Stark, Jr., MD., FACG  

## 2013-05-13 ENCOUNTER — Encounter: Payer: Self-pay | Admitting: Cardiology

## 2013-05-15 ENCOUNTER — Encounter: Payer: Self-pay | Admitting: Gastroenterology

## 2013-05-15 ENCOUNTER — Ambulatory Visit (AMBULATORY_SURGERY_CENTER): Payer: BC Managed Care – PPO | Admitting: Gastroenterology

## 2013-05-15 VITALS — BP 159/80 | HR 64 | Temp 97.1°F | Resp 16 | Ht 64.0 in | Wt 154.0 lb

## 2013-05-15 DIAGNOSIS — D126 Benign neoplasm of colon, unspecified: Secondary | ICD-10-CM

## 2013-05-15 DIAGNOSIS — Z1211 Encounter for screening for malignant neoplasm of colon: Secondary | ICD-10-CM

## 2013-05-15 DIAGNOSIS — K648 Other hemorrhoids: Secondary | ICD-10-CM

## 2013-05-15 DIAGNOSIS — K921 Melena: Secondary | ICD-10-CM

## 2013-05-15 MED ORDER — SODIUM CHLORIDE 0.9 % IV SOLN: 500.0000 mL | INTRAVENOUS | Status: DC

## 2013-05-15 NOTE — Patient Instructions (Signed)
YOU HAD AN ENDOSCOPIC PROCEDURE TODAY AT THE [facility]: Refer to the procedure report that was given to you for any specific questions about what was found during the examination.  If the procedure report does not answer your questions, please call your gastroenterologist to clarify.  If you requested that your care partner not be given the details of your procedure findings, then the procedure report has been included in a sealed envelope for you to review at your convenience later.  YOU SHOULD EXPECT: Some feelings of bloating in the abdomen. Passage of more gas than usual.  Walking can help get rid of the air that was put into your GI tract during the procedure and reduce the bloating. If you had a lower endoscopy (such as a colonoscopy or flexible sigmoidoscopy) you may notice spotting of blood in your stool or on the toilet paper. If you underwent a bowel prep for your procedure, then you may not have a normal bowel movement for a few days.  DIET: Your first meal following the procedure should be a light meal and then it is ok to progress to your normal diet.  A half-sandwich or bowl of soup is an example of a good first meal.  Heavy or fried foods are harder to digest and may make you feel nauseous or bloated.  Likewise meals heavy in dairy and vegetables can cause extra gas to form and this can also increase the bloating.  Drink plenty of fluids but you should avoid alcoholic beverages for 24 hours.  ACTIVITY: Your care partner should take you home directly after the procedure.  You should plan to take it easy, moving slowly for the rest of the day.  You can resume normal activity the day after the procedure however you should NOT DRIVE or use heavy machinery for 24 hours (because of the sedation medicines used during the test).    SYMPTOMS TO REPORT IMMEDIATELY: A gastroenterologist can be reached at any hour.  During normal business hours, 8:30 AM to 5:00 PM Monday through Friday,  call [phone].  After hours and on weekends, please call the GI answering service at [phone] who will take a message and have the physician on call contact you.   Following lower endoscopy (colonoscopy or flexible sigmoidoscopy):  Excessive amounts of blood in the stool  Significant tenderness or worsening of abdominal pains  Swelling of the abdomen that is new, acute  Fever of 100F or higher    FOLLOW UP: If any biopsies were taken you will be contacted by phone or by letter within the next 1-3 weeks.  Call your gastroenterologist if you have not heard about the biopsies in 3 weeks.  Our staff will call the home number listed on your records the next business day following your procedure to check on you and address any questions or concerns that you may have at that time regarding the information given to you following your procedure. This is a courtesy call and so if there is no answer at the home number and we have not heard from you through the emergency physician on call, we will assume that you have returned to your regular daily activities without incident.  SIGNATURES/CONFIDENTIALITY: You and/or your care partner have signed paperwork which will be entered into your electronic medical record.  These signatures attest to the fact that that the information above on your After Visit Summary has been reviewed and is understood.  Full responsibility of the confidentiality   of this discharge information lies with you and/or your care-partner.   INFORMATION ON POLYPS,DIVERTICULOSIS,& HEMORRHOIDS GIVEN TO YOU TODAY

## 2013-05-15 NOTE — Progress Notes (Signed)
Called to room to assist during endoscopic procedure.  Patient ID and intended procedure confirmed with present staff. Received instructions for my participation in the procedure from the performing physician.  

## 2013-05-15 NOTE — Progress Notes (Signed)
Procedure ends, to recovery, report given and VSS. 

## 2013-05-15 NOTE — Op Note (Signed)
[facility]  [address] [place], [id number]   COLONOSCOPY PROCEDURE REPORT PATIENT: Brent Griffith, Brent Griffith  MR#: [id number] BIRTHDATE: [birth year] , 5  yrs. old GENDER: Male ENDOSCOPIST: Ladene Artist, MD, [facility] REFERRED XB:JYNWGN Nyoka Cowden, M.D. PROCEDURE DATE:  05/15/2013 PROCEDURE:   Colonoscopy, screening and Hemorrhoidectomy via sclerosing First Screening Colonoscopy - Avg.  risk and is 76 yrs.  old or older - No.  Prior Negative Screening - Now for repeat screening. 10 or more years since last screening  History of Adenoma - Now for follow-up colonoscopy & has been > or = to 3 yrs.  N/A  Polyps Removed Today? Yes. ASA CLASS:   Class II INDICATIONS:average risk screening. MEDICATIONS: MAC sedation, administered by CRNA and propofol (Diprivan) 130mg  IV DESCRIPTION OF PROCEDURE:   After the risks benefits and alternatives of the procedure were thoroughly explained, informed consent was obtained.  A digital rectal exam revealed no abnormalities of the rectum.   The LB [id number]  endoscope was introduced through the anus and advanced to the cecum, which was identified by both the appendix and ileocecal valve. No adverse events experienced.   The quality of the prep was good, using MoviPrep  The instrument was then slowly withdrawn as the colon was fully examined.  COLON FINDINGS: A sessile polyp measuring 6 mm in size was found in the transverse colon.  A polypectomy was performed with a cold snare.  The resection was complete and the polyp tissue was completely retrieved.   Mild diverticulosis was noted in the sigmoid colon.   The colon was otherwise normal.  There was no diverticulosis, inflammation, polyps or cancers unless previously stated.  Retroflexed views revealed small internal hemorrhoids. 1 cc of 23.4% saline injected into the internal hemorrhoids well above the dentate line. The time to cecum=1 minutes 23 seconds. Withdrawal time=10 minutes 18  seconds.  The scope was withdrawn and the procedure completed. COMPLICATIONS: There were no complications. ENDOSCOPIC IMPRESSION: 1.   Sessile polyp measuring 6 mm in the transverse colon; polypectomy performed with a cold snare 2.   Mild diverticulosis in the sigmoid colon 3.   Small internal hemorrhoids; injected  RECOMMENDATIONS: 1.  Await pathology results 2.  Repeat colonoscopy in 5 years if polyp adenomatous; otherwise no plans for routine screening colonoscopy as these type of exams usually stop around age 78 3.  High fiber diet with liberal fluid intake.  eSigned:  Ladene Artist, MD, [facility] 05/15/2013 3:40 PM

## 2013-05-15 NOTE — Progress Notes (Signed)
No egg or soy allergy. ewm No problems with past sedation. ewm

## 2013-05-18 ENCOUNTER — Telehealth: Payer: Self-pay | Admitting: *Deleted

## 2013-05-18 NOTE — Telephone Encounter (Signed)
Message left

## 2013-05-19 ENCOUNTER — Ambulatory Visit (HOSPITAL_COMMUNITY): Payer: BC Managed Care – PPO | Attending: Cardiology | Admitting: Radiology

## 2013-05-19 ENCOUNTER — Encounter: Payer: Self-pay | Admitting: Cardiovascular Disease

## 2013-05-19 VITALS — BP 142/85 | HR 63 | Ht 64.0 in | Wt 154.0 lb

## 2013-05-19 DIAGNOSIS — I2510 Atherosclerotic heart disease of native coronary artery without angina pectoris: Secondary | ICD-10-CM | POA: Insufficient documentation

## 2013-05-19 DIAGNOSIS — E785 Hyperlipidemia, unspecified: Secondary | ICD-10-CM | POA: Insufficient documentation

## 2013-05-19 DIAGNOSIS — Z951 Presence of aortocoronary bypass graft: Secondary | ICD-10-CM | POA: Insufficient documentation

## 2013-05-19 DIAGNOSIS — I10 Essential (primary) hypertension: Secondary | ICD-10-CM | POA: Insufficient documentation

## 2013-05-19 DIAGNOSIS — Z8249 Family history of ischemic heart disease and other diseases of the circulatory system: Secondary | ICD-10-CM | POA: Insufficient documentation

## 2013-05-19 DIAGNOSIS — Z01810 Encounter for preprocedural cardiovascular examination: Secondary | ICD-10-CM | POA: Insufficient documentation

## 2013-05-19 MED ORDER — REGADENOSON 0.4 MG/5ML IV SOLN
0.4000 mg | Freq: Once | INTRAVENOUS | Status: AC
  Administered 2013-05-19: 0.4 mg via INTRAVENOUS

## 2013-05-19 MED ORDER — TECHNETIUM TC 99M SESTAMIBI GENERIC - CARDIOLITE
33.0000 | Freq: Once | INTRAVENOUS | Status: AC | PRN
  Administered 2013-05-19: 33 via INTRAVENOUS

## 2013-05-19 MED ORDER — TECHNETIUM TC 99M SESTAMIBI GENERIC - CARDIOLITE
11.0000 | Freq: Once | INTRAVENOUS | Status: AC | PRN
  Administered 2013-05-19: 11 via INTRAVENOUS

## 2013-05-19 NOTE — Progress Notes (Signed)
[facility] [address] [place], [place] [address] [phone]    Cardiology Nuclear Med Study  ISAY PERLEBERG is a 78 y.o. male     MRN : [id number]     DOB: 1935-08-22  Procedure Date: 05/19/2013  Nuclear Med Background Indication for Stress Test:  Evaluation for Ischemia, Surgical Clearance and Pending prostatectomy by Dr.Tannenbaum History:  CAD, Cath, CABG 2010, Echo 2010 EF 50-55%, MPI 2010 (ischemia) EF 47% Cardiac Risk Factors: Family History - CAD, Hypertension and Lipids  Symptoms:  None indicated   Nuclear Pre-Procedure Caffeine/Decaff Intake:  None NPO After: 10:00pm   Lungs:  clear O2 Sat: 98% on room air. IV 0.9% NS with Angio Cath:  22g  IV Site: R Antecubital  IV Started by:  Perrin Maltese, EMT-P  Chest Size (in):  44 Cup Size: n/a  Height: 5\' 4"  (1.626 m)  Weight:  154 lb (69.854 kg)  BMI:  Body mass index is 26.42 kg/(m^2). Tech Comments:  No Rx this am    Nuclear Med Study 1 or 2 day study: 1 day  Stress Test Type:  Carlton Adam  Reading MD: n/a  Order Authorizing Provider:  B.Chidi Shirer MD  Resting Radionuclide: Technetium 18m Sestamibi  Resting Radionuclide Dose: 11.0 mCi   Stress Radionuclide:  Technetium 92m Sestamibi  Stress Radionuclide Dose: 33.0 mCi           Stress Protocol Rest HR: 63 Stress HR: 112  Rest BP: 142/85 Stress BP: 156/76  Exercise Time (min): n/a METS: n/a           Dose of Adenosine (mg):  n/a Dose of Lexiscan: 0.4 mg  Dose of Atropine (mg): n/a Dose of Dobutamine: n/a mcg/kg/min (at max HR)  Stress Test Technologist: Glade Lloyd, BS-ES  Nuclear Technologist:  Annye Rusk, CNMT     Rest Procedure:  Myocardial perfusion imaging was performed at rest 45 minutes following the intravenous administration of Technetium 59m Sestamibi. Rest ECG: Sinus rhythm, inferior lateral TWI.  Stress Procedure:  The patient received IV Lexiscan 0.4 mg over 15-seconds with concurrent low level exercise and  then Technetium 5m Sestamibi was injected at 30-seconds while the patient continued walking one more minute.  Quantitative spect images were obtained after a 45-minute delay.  During the infusion of Lexiscan, the patient complained of SOB.  This resolved in recovery.  Stress ECG: No significant ST segment change suggestive of ischemia (difficult to interpret due to baseline changes).  QPS Raw Data Images:  Acquisition technically good; LVE. Stress Images:  There is decreased uptake in the anterior, lateral and inferior walls. Rest Images:  There is decreased uptake in the anterior, lateral and inferior walls, less prominent compared to the stress images. Subtraction (SDS):  These findings are consistent with prior infarct and mild peri-infarct ischemia. Transient Ischemic Dilatation (Normal <1.22):  1.01 Lung/Heart Ratio (Normal <0.45):  0.27  Quantitative Gated Spect Images QGS EDV:  130 ml QGS ESV:  64 ml  Impression Exercise Capacity:  Lexiscan with low level exercise. BP Response:  Normal blood pressure response. Clinical Symptoms:  There is dyspnea. ECG Impression:  No significant ST segment change suggestive of ischemia (difficult to interpret due to baseline changes). Comparison with Prior Nuclear Study: No images to compare  Overall Impression:  Low risk stress nuclear study with large, severe, partially reversible anterior, lateral and inferior defects consistent with prior infarct and mild peri-infarct ischemia.  LV Ejection Fraction: 50%.  LV Wall Motion:  Hypokinesis of the anterolateral wall.   Brent Griffith

## 2013-05-20 ENCOUNTER — Telehealth: Payer: Self-pay | Admitting: Gastroenterology

## 2013-05-20 MED ORDER — HYDROCORTISONE 2.5 % RE CREA: 1.0000 "application " | TOPICAL_CREAM | Freq: Every day | RECTAL | Status: DC

## 2013-05-20 NOTE — Telephone Encounter (Signed)
Hemorrhoidal care, Anusol HC supp daily for 5-7 days.

## 2013-05-20 NOTE — Telephone Encounter (Signed)
Patient aware Soak in tub of warm water/sitz bath 10 minutes BID, Tucs pads, meticulous cleaning between bowel movements. He will call back with any additional questions or concerns

## 2013-05-20 NOTE — Telephone Encounter (Signed)
Patient with continued rectal bleeding and protruding hemorrhoid.  Ok to send in Convent suppositories?

## 2013-05-21 ENCOUNTER — Encounter: Payer: Self-pay | Admitting: Gastroenterology

## 2013-05-24 ENCOUNTER — Other Ambulatory Visit: Payer: Self-pay | Admitting: Nurse Practitioner

## 2013-06-02 ENCOUNTER — Encounter (HOSPITAL_BASED_OUTPATIENT_CLINIC_OR_DEPARTMENT_OTHER): Payer: Self-pay | Admitting: Emergency Medicine

## 2013-06-02 ENCOUNTER — Emergency Department (HOSPITAL_BASED_OUTPATIENT_CLINIC_OR_DEPARTMENT_OTHER): Payer: BC Managed Care – PPO

## 2013-06-02 ENCOUNTER — Emergency Department (HOSPITAL_BASED_OUTPATIENT_CLINIC_OR_DEPARTMENT_OTHER)
Admission: EM | Admit: 2013-06-02 | Discharge: 2013-06-03 | Disposition: A | Discharge status: Home / Self Care | Payer: BC Managed Care – PPO | Attending: Emergency Medicine | Admitting: Emergency Medicine

## 2013-06-02 DIAGNOSIS — Z95818 Presence of other cardiac implants and grafts: Secondary | ICD-10-CM | POA: Insufficient documentation

## 2013-06-02 DIAGNOSIS — Z862 Personal history of diseases of the blood and blood-forming organs and certain disorders involving the immune mechanism: Secondary | ICD-10-CM | POA: Insufficient documentation

## 2013-06-02 DIAGNOSIS — I10 Essential (primary) hypertension: Secondary | ICD-10-CM | POA: Insufficient documentation

## 2013-06-02 DIAGNOSIS — M810 Age-related osteoporosis without current pathological fracture: Secondary | ICD-10-CM | POA: Insufficient documentation

## 2013-06-02 DIAGNOSIS — Z79899 Other long term (current) drug therapy: Secondary | ICD-10-CM | POA: Insufficient documentation

## 2013-06-02 DIAGNOSIS — N400 Enlarged prostate without lower urinary tract symptoms: Secondary | ICD-10-CM | POA: Insufficient documentation

## 2013-06-02 DIAGNOSIS — Z87448 Personal history of other diseases of urinary system: Secondary | ICD-10-CM | POA: Insufficient documentation

## 2013-06-02 DIAGNOSIS — IMO0002 Reserved for concepts with insufficient information to code with codable children: Secondary | ICD-10-CM | POA: Insufficient documentation

## 2013-06-02 DIAGNOSIS — I2510 Atherosclerotic heart disease of native coronary artery without angina pectoris: Secondary | ICD-10-CM | POA: Insufficient documentation

## 2013-06-02 DIAGNOSIS — Z8619 Personal history of other infectious and parasitic diseases: Secondary | ICD-10-CM | POA: Insufficient documentation

## 2013-06-02 DIAGNOSIS — J111 Influenza due to unidentified influenza virus with other respiratory manifestations: Secondary | ICD-10-CM

## 2013-06-02 DIAGNOSIS — Z7982 Long term (current) use of aspirin: Secondary | ICD-10-CM | POA: Insufficient documentation

## 2013-06-02 DIAGNOSIS — E559 Vitamin D deficiency, unspecified: Secondary | ICD-10-CM | POA: Insufficient documentation

## 2013-06-02 DIAGNOSIS — K219 Gastro-esophageal reflux disease without esophagitis: Secondary | ICD-10-CM | POA: Insufficient documentation

## 2013-06-02 DIAGNOSIS — Z8711 Personal history of peptic ulcer disease: Secondary | ICD-10-CM | POA: Insufficient documentation

## 2013-06-02 DIAGNOSIS — R630 Anorexia: Secondary | ICD-10-CM | POA: Insufficient documentation

## 2013-06-02 DIAGNOSIS — E785 Hyperlipidemia, unspecified: Secondary | ICD-10-CM | POA: Insufficient documentation

## 2013-06-02 DIAGNOSIS — J45909 Unspecified asthma, uncomplicated: Secondary | ICD-10-CM | POA: Insufficient documentation

## 2013-06-02 DIAGNOSIS — Z8669 Personal history of other diseases of the nervous system and sense organs: Secondary | ICD-10-CM | POA: Insufficient documentation

## 2013-06-02 DIAGNOSIS — Z951 Presence of aortocoronary bypass graft: Secondary | ICD-10-CM | POA: Insufficient documentation

## 2013-06-02 DIAGNOSIS — R079 Chest pain, unspecified: Secondary | ICD-10-CM | POA: Insufficient documentation

## 2013-06-02 LAB — CBC
HEMATOCRIT: 37.4 % — AB (ref 39.0–52.0)
HEMOGLOBIN: 12.8 g/dL — AB (ref 13.0–17.0)
MCH: 29.8 pg (ref 26.0–34.0)
MCHC: 34.2 g/dL (ref 30.0–36.0)
MCV: 87 fL (ref 78.0–100.0)
PLATELETS: 149 10*3/uL — AB (ref 150–400)
RBC: 4.3 MIL/uL (ref 4.22–5.81)
RDW: 12.4 % (ref 11.5–15.5)
WBC: 7 10*3/uL (ref 4.0–10.5)

## 2013-06-02 MED ORDER — SODIUM CHLORIDE 0.9 % IV BOLUS (SEPSIS)
1000.0000 mL | Freq: Once | INTRAVENOUS | Status: AC
  Administered 2013-06-02: 1000 mL via INTRAVENOUS

## 2013-06-02 MED ORDER — ACETAMINOPHEN 500 MG PO TABS
1000.0000 mg | ORAL_TABLET | Freq: Once | ORAL | Status: AC
  Administered 2013-06-02: 1000 mg via ORAL
  Filled 2013-06-02: qty 2

## 2013-06-02 NOTE — ED Provider Notes (Addendum)
CSN: [id number]     Arrival date & time 06/02/13  2040 History  This chart was scribed for Osvaldo Shipper, MD,  by Stacy Gardner, ED Scribe. The patient was seen in room MH11/MH11 and the patient's care was started at 11:28 PM.    First MD Initiated Contact with Patient 06/02/13 2257     Chief Complaint  Patient presents with  . URI   (Consider location/radiation/quality/duration/timing/severity/associated sxs/prior Treatment) Patient is a 78 y.o. male presenting with flu symptoms. The history is provided by the patient.  Influenza Presenting symptoms: cough, fever (subjective) and rhinorrhea   Presenting symptoms: no nausea and no vomiting   Cough:    Cough characteristics:  Non-productive   Sputum characteristics:  Nondescript   Severity:  Mild   Onset quality:  Sudden   Duration:  1 day   Timing:  Constant   Progression:  Unchanged   Chronicity:  New Associated symptoms: chills    HPI Comments: Brent Griffith is a 78 y.o. male who presents to the Emergency Department complaining of unchanged URI symptoms since yesterday. Pt has the associated symptoms warmth to his hands, productive cough, rhinorrhea, chills, ankle swelling, decreased appetite and fever (100.4 at home). Pt has aching chest pain. He explains it feels as though he has been "run over by a truck". Denies nausea and vomiting.  Denies dysuria. Had sick contact with his wife. Pt had open four heart bypass surgery previously. He does not smoke.  Pt had stress test last week that was normal.  Past Medical History  Diagnosis Date  . Unspecified essential hypertension   . Esophageal reflux   . Hypersomnia with sleep apnea, unspecified   . Unspecified sleep apnea   . Asthma   . Hyperlipidemia   . CAD (coronary artery disease)   . Enlarged prostate   . Kyphosis   . Trigger finger (acquired)   . Anemia, unspecified   . Lumbago   . Senile osteoporosis   . Unspecified vitamin D deficiency   . Elevated prostate  specific antigen (PSA)   . Herpes zoster without mention of complication   . Acute gastric ulcer without mention of hemorrhage, perforation, or obstruction   . Obstructive sleep apnea (adult) (pediatric)   . Impotence of organic origin   . Allergic rhinitis, cause unspecified   . Extrinsic asthma, unspecified   . Diaphragmatic hernia without mention of obstruction or gangrene   . Blood transfusion without reported diagnosis    Past Surgical History  Procedure Laterality Date  . Coronary artery bypass graft    . Tonsillectomy  1945  . Shoulder surgery Left 04/1998    Dr French Ana  . Mole removal  1958  . Cardiac catheterization  03/04/2009    Dr [name]  . Colonoscopy     Family History  Problem Relation Age of Onset  . Stroke Mother   . Hypertension Father   . Stroke Father   . Heart disease Father   . Heart disease Paternal Grandfather   . Colon cancer Neg Hx    History  Substance Use Topics  . Smoking status: Never Smoker   . Smokeless tobacco: Never Used  . Alcohol Use: No    Review of Systems  Constitutional: Positive for fever (subjective), chills and appetite change.  HENT: Positive for rhinorrhea.   Respiratory: Positive for cough.   Cardiovascular: Positive for chest pain (with coughing).  Gastrointestinal: Negative for nausea, vomiting and abdominal pain.  Genitourinary: Negative for dysuria.  All other systems reviewed and are negative.    Allergies  Compazine  Home Medications   Current Outpatient Rx  Name  Route  Sig  Dispense  Refill  . acetaminophen-codeine (TYLENOL #3) 300-30 MG per tablet      Take one tablet by mouth up to four times daily as needed for pain   100 tablet   0   . aspirin 325 MG EC tablet   Oral   Take 325 mg by mouth daily.           Marland Kitchen atenolol (TENORMIN) 50 MG tablet      TAKE 1 TABLET BY MOUTH EVERY DAY TO CONTROL HEART RHYTHM AND LOWER BLOOD PRESSURE   90 tablet   3   . Calcium Citrate 250 MG TABS   Oral    Take 1 tablet by mouth daily.          . chlorpheniramine-HYDROcodone (TUSSIONEX PENNKINETIC ER) 10-8 MG/5ML LQCR      5 cc every 12 hours if needed to control cough   180 mL   0   . cholecalciferol (VITAMIN D) 1000 UNITS tablet   Oral   Take 1,000 Units by mouth daily.           . Coenzyme Q10 (COQ10) 100 MG CAPS   Oral   Take by mouth daily.           Marland Kitchen denosumab (PROLIA) 60 MG/ML SOLN injection   Subcutaneous   Inject 60 mg into the skin every 6 (six) months. Administer in upper arm, thigh, or abdomen.  Patient receives this injection every 6 months.         . doxazosin (CARDURA) 4 MG tablet      TAKE 2 TABLETS BY MOUTH TWICE A DAY TO HELP PROSTATE   360 tablet   1   . DUTASTERIDE-TAMSULOSIN HCL PO   Oral   Take 5 mg by mouth. Take 1 tablet by mouth in the morning to help with prostrate.         . Glucosamine 500 MG CAPS   Oral   Take 1,000 mg by mouth daily.          . hydrocortisone (ANUSOL-HC) 2.5 % rectal cream   Rectal   Place 1 application rectally at bedtime.   12 g   1   . Multiple Vitamin (MULTIVITAMIN) tablet   Oral   Take 1 tablet by mouth daily.           Marland Kitchen omeprazole (PRILOSEC) 40 MG capsule   Oral   Take 20 mg by mouth 2 (two) times daily.          . Potassium 99 MG TABS   Oral   Take 99 mg by mouth daily.         . Probiotic Product (PROBIOTIC DAILY PO)   Oral   Take 1 capsule by mouth daily.         . rosuvastatin (CRESTOR) 20 MG tablet   Oral   Take 10 mg by mouth daily.           BP 146/72  Pulse 96  Temp(Src) 98.4 F (36.9 C) (Oral)  Resp 20  Ht 5\' 4"  (1.626 m)  Wt 150 lb (68.04 kg)  BMI 25.73 kg/m2  SpO2 96% Physical Exam  Constitutional: He is oriented to person, place, and time. He appears well-developed and well-nourished. No distress.  HENT:  Head: Normocephalic and atraumatic.  Mouth/Throat: No oropharyngeal  exudate.  Eyes: EOM are normal. Pupils are equal, round, and reactive to light.   Neck: Normal range of motion. Neck supple.  Cardiovascular: Normal rate and regular rhythm.  Exam reveals no friction rub.   No murmur heard. Pulmonary/Chest: Effort normal and breath sounds normal. No respiratory distress. He has no wheezes. He has no rales.  Abdominal: He exhibits no distension. There is no tenderness. There is no rebound.  Musculoskeletal: Normal range of motion. He exhibits no edema.  Neurological: He is alert and oriented to person, place, and time.  Skin: He is not diaphoretic.    ED Course  Procedures (including critical care time) DIAGNOSTIC STUDIES: Oxygen Saturation is 96% on room air, normal by my interpretation.    COORDINATION OF CARE:  11:33 PM Discussed course of care with pt . Pt understands and agrees.  Labs Review Labs Reviewed  CBC - Abnormal; Notable for the following:    Hemoglobin 12.8 (*)    HCT 37.4 (*)    Platelets 149 (*)    All other components within normal limits  BASIC METABOLIC PANEL - Abnormal; Notable for the following:    Sodium 136 (*)    Glucose, Bld 122 (*)    GFR calc non Af Amer 85 (*)    All other components within normal limits   Imaging Review Dg Chest 2 View  06/03/2013   CLINICAL DATA:  Cough, fever.  EXAM: CHEST  2 VIEW  COMPARISON:  04/22/2013  FINDINGS: Similar appearance of the heart and mediastinal contours, upper normal. Status post median sternotomy. Mild hyperexpansion. Linear opacity within the left lung base is favored to reflect atelectasis or scarring. Mild bilateral pleural thickening versus trace effusions. Status post CABG. No pneumothorax. No interval osseous change.  IMPRESSION: Hyperinflation and mild left lung base atelectasis or scarring, similar to prior.   Electronically Signed   By: Carlos Levering M.D.   On: 06/03/2013 01:21    EKG Interpretation   None       MDM   1. Influenza    78 year old male with acute onset of cough, body aches. He is having chills also. Her GI symptoms.  Diffuse myalgias today. Here vitals are stable. He is had some sick contacts, his wife has been sick also. Patient has had some cough is nonproductive. Refills warm, repeat temp 100, given tylenol. Patient has clear lungs and a benign belly. Concern for influenza. He denies any dysuria. No meningeal signs. Will check chest x-ray rule out pneumonia and give fluids. On reexam, patient is feeling much better. Chest x-ray is normal. He is stable for discharge. He'll be treated with Tamiflu I personally performed the services described in this documentation, which was scribed in my presence. The recorded information has been reviewed and is accurate.      Osvaldo Shipper, MD 06/03/13 [id number]  Osvaldo Shipper, MD 06/03/13 [phone]

## 2013-06-02 NOTE — ED Notes (Signed)
Cough, body aches started yesterday-RT to triage to assess-lungs clear

## 2013-06-03 LAB — BASIC METABOLIC PANEL
BUN: 14 mg/dL (ref 6–23)
CHLORIDE: 99 meq/L (ref 96–112)
CO2: 21 meq/L (ref 19–32)
CREATININE: 0.8 mg/dL (ref 0.50–1.35)
Calcium: 8.7 mg/dL (ref 8.4–10.5)
GFR calc Af Amer: >90 mL/min (ref >90)
GFR calc non Af Amer: 85 mL/min — ABNORMAL LOW (ref >90)
Glucose, Bld: 122 mg/dL — ABNORMAL HIGH (ref 70–99)
Potassium: 4.2 mEq/L (ref 3.7–5.3)
Sodium: 136 mEq/L — ABNORMAL LOW (ref 137–147)

## 2013-06-03 MED ORDER — OSELTAMIVIR PHOSPHATE 75 MG PO CAPS: 75.0000 mg | ORAL_CAPSULE | Freq: Two times a day (BID) | ORAL | Status: DC

## 2013-06-03 NOTE — ED Notes (Signed)
MD at bedside discussing test results and dispo plan of care. 

## 2013-06-03 NOTE — Discharge Instructions (Signed)

## 2013-07-01 ENCOUNTER — Other Ambulatory Visit: Payer: Self-pay | Admitting: Sports Medicine

## 2013-07-01 DIAGNOSIS — M810 Age-related osteoporosis without current pathological fracture: Secondary | ICD-10-CM

## 2013-07-15 ENCOUNTER — Other Ambulatory Visit: Payer: Self-pay | Admitting: Internal Medicine

## 2013-07-16 ENCOUNTER — Encounter: Payer: Self-pay | Admitting: Internal Medicine

## 2013-08-05 ENCOUNTER — Other Ambulatory Visit: Payer: BC Managed Care – PPO

## 2013-08-05 ENCOUNTER — Ambulatory Visit
Admission: RE | Admit: 2013-08-05 | Discharge: 2013-08-05 | Disposition: A | Discharge status: Home / Self Care | Payer: BC Managed Care – PPO | Source: Ambulatory Visit | Attending: Sports Medicine | Admitting: Sports Medicine

## 2013-08-05 DIAGNOSIS — M810 Age-related osteoporosis without current pathological fracture: Secondary | ICD-10-CM

## 2013-08-05 DIAGNOSIS — Z87898 Personal history of other specified conditions: Secondary | ICD-10-CM

## 2013-08-05 DIAGNOSIS — I10 Essential (primary) hypertension: Secondary | ICD-10-CM

## 2013-08-05 DIAGNOSIS — E785 Hyperlipidemia, unspecified: Secondary | ICD-10-CM

## 2013-08-06 ENCOUNTER — Telehealth: Payer: Self-pay | Admitting: Internal Medicine

## 2013-08-06 ENCOUNTER — Telehealth: Payer: Self-pay | Admitting: *Deleted

## 2013-08-06 LAB — COMPREHENSIVE METABOLIC PANEL
ALBUMIN: 4.1 g/dL (ref 3.5–4.8)
ALK PHOS: 39 IU/L (ref 39–117)
ALT: 22 IU/L (ref 0–44)
AST: 22 IU/L (ref 0–40)
Albumin/Globulin Ratio: 1.9 (ref 1.1–2.5)
BUN / CREAT RATIO: 16 (ref 10–22)
BUN: 14 mg/dL (ref 8–27)
CO2: 22 mmol/L (ref 18–29)
Calcium: 9.2 mg/dL (ref 8.6–10.2)
Chloride: 101 mmol/L (ref 97–108)
Creatinine, Ser: 0.89 mg/dL (ref 0.76–1.27)
GFR calc Af Amer: 97 mL/min/{1.73_m2} (ref >59)
GFR calc non Af Amer: 84 mL/min/{1.73_m2} (ref >59)
Globulin, Total: 2.2 g/dL (ref 1.5–4.5)
Glucose: 91 mg/dL (ref 65–99)
Potassium: 4.4 mmol/L (ref 3.5–5.2)
SODIUM: 141 mmol/L (ref 134–144)
Total Bilirubin: 1.5 mg/dL — ABNORMAL HIGH (ref 0.0–1.2)
Total Protein: 6.3 g/dL (ref 6.0–8.5)

## 2013-08-06 LAB — LIPID PANEL
CHOLESTEROL TOTAL: 109 mg/dL (ref 100–199)
Chol/HDL Ratio: 2.1 ratio units (ref 0.0–5.0)
HDL: 51 mg/dL (ref >39)
LDL CALC: 39 mg/dL (ref 0–99)
Triglycerides: 93 mg/dL (ref 0–149)
VLDL Cholesterol Cal: 19 mg/dL (ref 5–40)

## 2013-08-06 LAB — PSA: PSA: 3.3 ng/mL (ref 0.0–4.0)

## 2013-08-06 NOTE — Telephone Encounter (Signed)
I have received surgery clearance form for Brent Griffith to complete. Pt last seen 04-09-13 and to return in 1 year for follow up. I have placed form and phone message on Brent Griffith's cart.

## 2013-08-06 NOTE — Telephone Encounter (Signed)
Pam with Dr. Arlyn Leak office called and stated that they need a medical clearance for patient. Patient is going to have a TURP done and needs clearance. Please fax to # [phone]

## 2013-08-07 ENCOUNTER — Other Ambulatory Visit: Payer: BC Managed Care – PPO

## 2013-08-11 ENCOUNTER — Ambulatory Visit (INDEPENDENT_AMBULATORY_CARE_PROVIDER_SITE_OTHER): Payer: BC Managed Care – PPO | Admitting: Internal Medicine

## 2013-08-11 ENCOUNTER — Encounter: Payer: Self-pay | Admitting: Internal Medicine

## 2013-08-11 VITALS — BP 140/78 | HR 59 | Temp 97.7°F | Resp 10 | Ht 64.0 in | Wt 160.0 lb

## 2013-08-11 DIAGNOSIS — Z87898 Personal history of other specified conditions: Secondary | ICD-10-CM

## 2013-08-11 DIAGNOSIS — I2510 Atherosclerotic heart disease of native coronary artery without angina pectoris: Secondary | ICD-10-CM

## 2013-08-11 DIAGNOSIS — K219 Gastro-esophageal reflux disease without esophagitis: Secondary | ICD-10-CM

## 2013-08-11 DIAGNOSIS — I10 Essential (primary) hypertension: Secondary | ICD-10-CM

## 2013-08-11 DIAGNOSIS — E785 Hyperlipidemia, unspecified: Secondary | ICD-10-CM

## 2013-08-11 MED ORDER — ZOSTER VACCINE LIVE 19400 UNT/0.65ML ~~LOC~~ SOLR: 0.6500 mL | Freq: Once | SUBCUTANEOUS | Status: DC

## 2013-08-11 NOTE — Progress Notes (Signed)
Patient ID: Brent Griffith, male   DOB: 17-Dec-1935, 78 y.o.   MRN: [id number]     Location:    [facility]  Place of Service:  OFFICE  PCP: Estill Dooms, MD  Code Status: FULL  Extended Emergency Contact Information Primary Emergency Contact: Talwar,Loretta Address: [address]          [place] [address] [place] Phone: [phone] Mobile Phone: [phone] Relation: Spouse  Allergies  Allergen Reactions  . Compazine [Prochlorperazine Edisylate] Anxiety    Chief Complaint  Patient presents with  . Medical Managment of Chronic Issues    4 month follow-up, discuss labs (copy printed) and discuss prostate surgery with Urologist     HPI:  BENIGN PROSTATIC HYPERTROPHY, HX OF: Nocturia x3. Hesitation. Weak stream. Scheduled for a surgical procedure, TURP, by Dr. Gaynelle Arabian.  HYPERTENSION: Controlled  Other and unspecified hyperlipidemia: Controlled  Coronary atherosclerosis of native coronary artery: Stable. Denies angina, dyspnea, or palpitations.  G E R D: Occasional heartburn.      Past Medical History  Diagnosis Date  . Unspecified essential hypertension   . Esophageal reflux   . Hypersomnia with sleep apnea, unspecified   . Unspecified sleep apnea   . Asthma   . Hyperlipidemia   . CAD (coronary artery disease)   . Enlarged prostate   . Kyphosis   . Trigger finger (acquired)   . Anemia, unspecified   . Lumbago   . Senile osteoporosis   . Unspecified vitamin D deficiency   . Elevated prostate specific antigen (PSA)   . Herpes zoster without mention of complication   . Acute gastric ulcer without mention of hemorrhage, perforation, or obstruction   . Obstructive sleep apnea (adult) (pediatric)   . Impotence of organic origin   . Allergic rhinitis, cause unspecified   . Extrinsic asthma, unspecified   . Diaphragmatic hernia without mention of obstruction or gangrene   . Blood transfusion without reported diagnosis     Past  Surgical History  Procedure Laterality Date  . Coronary artery bypass graft    . Tonsillectomy  1945  . Shoulder surgery Left 04/1998    Dr French Ana  . Mole removal  1958  . Cardiac catheterization  03/04/2009    Dr [name]  . Colonoscopy      CONSULTANTS Dr. Lajoyce Corners - Gastroenterologist Dr. French Ana - Orthopedist Clista Bernhardt- Acupuncture Dr. Wandra Feinstein Orthopedist Dr. Thresa Ross: Cardio Dr. Donneta Romberg: Allergy Dr. Katy Fitch: Dierdre Harness: cardiovascular surgeon  Dr. Gaynelle Arabian: Urologist  PAST PROCEDURES 06/1985 - UGI:  Hiatal Hernia 06/1985 - GB Ultrasound:  Normal 10/1993 - EGD:  Gastric Ulcer (Dr. Lajoyce Corners) 10/1993 - EGD:  Healed Gastric Ulcer (Dr. Lajoyce Corners) 04/2003 - Colonoscopy:  Normal (Dr. Lajoyce Corners) 04/2003 - EGD:  Reflux Esophagitis (Dr. Lajoyce Corners) 11/2003 - Endoscopy (Dr. Claiborne Rigg) 11/2003 - Colonoscopy 2006-Prostate Biopsy-Dr.Kimbrough 2008-EKG 02/17/2009 Echocardiogram Dr. [name] 05/19/2009 Bone Density -osteoporosis 10/22/2010-Lumbar Spine X-Ray: No acute bony findings or significant degenerative changes. 06/07/11 bone density: Low lung mass. T score -2.0  08/05/13 bone density: Lobe lung mass. T score -1.6  05/15/13 colonoscopy [facility] Plan): 6 mm sessile polyp of the transverse colon with polypectomy by snare. Mild diverticulosis sigmoid. Small internal hemorrhoids.  05/20/13 myocardial perfusion imaging: Low risk stress nuclear study with large, severe, partially reversible anterior, lateral, and inferior defects consistent with prior infarct and mild peri-infarct ischemia. LVEF 50%. LV wall motion: Hypokinesis of the anterolateral wall.   Social History: History   Social History  . Marital Status:  Married    Spouse Name: N/A    Number of Children: N/A  . Years of Education: N/A   Occupational History  . [employer]    Social History Main Topics  . Smoking status: Never Smoker   . Smokeless tobacco: Never Used  . Alcohol Use: No  . Drug Use: No  . Sexual  Activity: None   Other Topics Concern  . None   Social History Narrative  . None    Family History Family Status  Relation Status Death Age  . Mother Deceased   . Father Deceased   . Paternal Grandfather Deceased   . Sister Alive   . Maternal Grandmother Deceased     Pneumonia  . Maternal Grandfather Deceased     Parkinson's  . Paternal Grandmother Deceased    Family History  Problem Relation Age of Onset  . Stroke Mother   . Hypertension Father   . Stroke Father   . Heart disease Father   . Heart disease Paternal Grandfather   . Colon cancer Neg Hx      Medications: Patient's Medications  New Prescriptions   No medications on file  Previous Medications   ACETAMINOPHEN-CODEINE (TYLENOL #3) 300-30 MG PER TABLET    Take one tablet by mouth up to four times daily as needed for pain   ASPIRIN 325 MG EC TABLET    Take 325 mg by mouth daily.     ATENOLOL (TENORMIN) 50 MG TABLET    TAKE 1 TABLET BY MOUTH EVERY DAY TO CONTROL HEART RHYTHM AND LOWER BLOOD PRESSURE   CALCIUM CITRATE 250 MG TABS    Take 1 tablet by mouth daily.    CHLORPHENIRAMINE-HYDROCODONE (TUSSIONEX PENNKINETIC ER) 10-8 MG/5ML LQCR    5 cc every 12 hours if needed to control cough   CHOLECALCIFEROL (VITAMIN D) 1000 UNITS TABLET    Take 1,000 Units by mouth daily.     COENZYME Q10 (COQ10) 100 MG CAPS    Take by mouth daily.     DENOSUMAB (PROLIA) 60 MG/ML SOLN INJECTION    Inject 60 mg into the skin every 6 (six) months. Administer in upper arm, thigh, or abdomen.  Patient receives this injection every 6 months.   DUTASTERIDE-TAMSULOSIN HCL PO    Take 5 mg by mouth. Take 1 tablet by mouth in the morning to help with prostrate.   GLUCOSAMINE 500 MG CAPS    Take 1,000 mg by mouth daily.    MULTIPLE VITAMIN (MULTIVITAMIN) TABLET    Take 1 tablet by mouth daily.     OMEPRAZOLE (PRILOSEC) 40 MG CAPSULE    Take 20 mg by mouth 2 (two) times daily.    POTASSIUM 99 MG TABS    Take 99 mg by mouth daily.   PROBIOTIC  PRODUCT (PROBIOTIC DAILY PO)    Take 1 capsule by mouth daily.   ROSUVASTATIN (CRESTOR) 20 MG TABLET    Take 10 mg by mouth daily.   Modified Medications   Modified Medication Previous Medication   DOXAZOSIN (CARDURA) 4 MG TABLET doxazosin (CARDURA) 4 MG tablet      1 by mouth daily for prostate    TAKE 2 TABLETS BY MOUTH TWICE A DAY TO HELP PROSTATE   ZOSTER VACCINE LIVE, PF, (ZOSTAVAX) 67209 UNT/0.65ML INJECTION zoster vaccine live, PF, (ZOSTAVAX) 47096 UNT/0.65ML injection      Inject 19,400 Units into the skin once.    Inject 0.65 mLs into the skin once.  Discontinued Medications  HYDROCORTISONE (ANUSOL-HC) 2.5 % RECTAL CREAM    Place 1 application rectally at bedtime.   OMEPRAZOLE (PRILOSEC) 20 MG CAPSULE    TAKE ONE CAPSULE BY MOUTH TWICE A DAY TO REDUCE STOMACH ACID AND PROTECT ESOPHAGUS   OSELTAMIVIR (TAMIFLU) 75 MG CAPSULE    Take 1 capsule (75 mg total) by mouth every 12 (twelve) hours.    Immunization History  Administered Date(s) Administered  . Influenza Split 01/30/2012  . Influenza Whole 02/17/2009, 02/18/2013  . Pneumococcal Conjugate-13 04/09/2013  . Pneumococcal Polysaccharide-23 10/18/2004  . Td 09/29/1990  . Tdap 07/25/2011     Review of Systems  Constitutional: Negative.   HENT: Negative.   Eyes: Negative.   Respiratory: Negative.        Wears CPAP. Followed by Dr. Annamaria Boots.  Cardiovascular: Negative for chest pain, palpitations and leg swelling.  Gastrointestinal: Negative.   Endocrine: Negative.   Genitourinary:       No dysuria.. Known BPH. Has had surgery recommended by Dr. Gaynelle Arabian, urologist.Hesitation, nocturia, dribbling, and occasional incontinence.  Musculoskeletal: Negative.   Skin: Negative.   Allergic/Immunologic: Negative.   Neurological: Negative.   Hematological: Negative.   Psychiatric/Behavioral: Negative.       Filed Vitals:   08/11/13 1406  BP: 140/78  Pulse: 59  Temp: 97.7 F (36.5 C)  TempSrc: Oral  Resp: 10  Height: 5'  4" (1.626 m)  Weight: 160 lb (72.576 kg)  SpO2: 99%   Physical Exam  Constitutional: He is oriented to person, place, and time. He appears well-developed and well-nourished. No distress.  HENT:  Right Ear: External ear normal.  Left Ear: External ear normal.  Nose: Nose normal.  Mouth/Throat: Oropharynx is clear and moist. No oropharyngeal exudate.  Eyes: Conjunctivae and EOM are normal. Pupils are equal, round, and reactive to light.  Neck: No JVD present. No tracheal deviation present. No thyromegaly present.  Cardiovascular: Normal rate, regular rhythm, normal heart sounds and intact distal pulses.  Exam reveals no gallop and no friction rub.   No murmur heard. Respiratory: He is in respiratory distress. He has no wheezes. He has no rales.  GI: He exhibits no distension and no mass. There is no tenderness.  Musculoskeletal: Normal range of motion. He exhibits no edema and no tenderness.  Lymphadenopathy:    He has no cervical adenopathy.  Neurological: He is alert and oriented to person, place, and time. He has normal reflexes. No cranial nerve deficit. Coordination normal.  Skin: No rash noted. No erythema. No pallor.  Psychiatric: He has a normal mood and affect. His behavior is normal. Judgment normal.       Labs reviewed: Appointment on 08/05/2013  Component Date Value Ref Range Status  . Cholesterol, Total 08/05/2013 109  100 - 199 mg/dL Final  . Triglycerides 08/05/2013 93  0 - 149 mg/dL Final  . HDL 08/05/2013 51  >39 mg/dL Final   Comment: According to ATP-III Guidelines, HDL-C >59 mg/dL is considered a                          negative risk factor for CHD.  Marland Kitchen VLDL Cholesterol Cal 08/05/2013 19  5 - 40 mg/dL Final  . LDL Calculated 08/05/2013 39  0 - 99 mg/dL Final  . Chol/HDL Ratio 08/05/2013 2.1  0.0 - 5.0 ratio units Final   Comment:  T. Chol/HDL Ratio                                                                      Men  Women                                                         1/2 Avg.Risk  3.4    3.3                                                            Avg.Risk  5.0    4.4                                                         2X Avg.Risk  9.6    7.1                                                         3X Avg.Risk 23.4   11.0  . Glucose 08/05/2013 91  65 - 99 mg/dL Final  . BUN 08/05/2013 14  8 - 27 mg/dL Final  . Creatinine, Ser 08/05/2013 0.89  0.76 - 1.27 mg/dL Final  . GFR calc non Af Amer 08/05/2013 84  >59 mL/min/1.73 Final  . GFR calc Af Amer 08/05/2013 97  >59 mL/min/1.73 Final  . BUN/Creatinine Ratio 08/05/2013 16  10 - 22 Final  . Sodium 08/05/2013 141  134 - 144 mmol/L Final  . Potassium 08/05/2013 4.4  3.5 - 5.2 mmol/L Final  . Chloride 08/05/2013 101  97 - 108 mmol/L Final  . CO2 08/05/2013 22  18 - 29 mmol/L Final  . Calcium 08/05/2013 9.2  8.6 - 10.2 mg/dL Final  . Total Protein 08/05/2013 6.3  6.0 - 8.5 g/dL Final  . Albumin 08/05/2013 4.1  3.5 - 4.8 g/dL Final  . Globulin, Total 08/05/2013 2.2  1.5 - 4.5 g/dL Final  . Albumin/Globulin Ratio 08/05/2013 1.9  1.1 - 2.5 Final  . Total Bilirubin 08/05/2013 1.5* 0.0 - 1.2 mg/dL Final  . Alkaline Phosphatase 08/05/2013 39  39 - 117 IU/L Final  . AST 08/05/2013 22  0 - 40 IU/L Final  . ALT 08/05/2013 22  0 - 44 IU/L Final  . PSA 08/05/2013 3.3  0.0 - 4.0 ng/mL Final   Comment: Roche ECLIA methodology.                          According to the American Urological Association, Serum PSA should  decrease and remain at undetectable levels after radical                          prostatectomy. The AUA defines biochemical recurrence as an initial                          PSA value 0.2 ng/mL or greater followed by a subsequent confirmatory                          PSA value 0.2 ng/mL or greater.                          Values obtained with different assay methods or kits cannot be used                           interchangeably. Results cannot be interpreted as absolute evidence                          of the presence or absence of malignant disease.  Admission on 06/02/2013, Discharged on 06/03/2013  Component Date Value Ref Range Status  . WBC 06/02/2013 7.0  4.0 - 10.5 K/uL Final  . RBC 06/02/2013 4.30  4.22 - 5.81 MIL/uL Final  . Hemoglobin 06/02/2013 12.8* 13.0 - 17.0 g/dL Final  . HCT 06/02/2013 37.4* 39.0 - 52.0 % Final  . MCV 06/02/2013 87.0  78.0 - 100.0 fL Final  . MCH 06/02/2013 29.8  26.0 - 34.0 pg Final  . MCHC 06/02/2013 34.2  30.0 - 36.0 g/dL Final  . RDW 06/02/2013 12.4  11.5 - 15.5 % Final  . Platelets 06/02/2013 149* 150 - 400 K/uL Final  . Sodium 06/02/2013 136* 137 - 147 mEq/L Final  . Potassium 06/02/2013 4.2  3.7 - 5.3 mEq/L Final  . Chloride 06/02/2013 99  96 - 112 mEq/L Final  . CO2 06/02/2013 21  19 - 32 mEq/L Final  . Glucose, Bld 06/02/2013 122* 70 - 99 mg/dL Final  . BUN 06/02/2013 14  6 - 23 mg/dL Final  . Creatinine, Ser 06/02/2013 0.80  0.50 - 1.35 mg/dL Final  . Calcium 06/02/2013 8.7  8.4 - 10.5 mg/dL Final  . GFR calc non Af Amer 06/02/2013 85* >90 mL/min Final  . GFR calc Af Amer 06/02/2013 >90  >90 mL/min Final   Comment: (NOTE)                          The eGFR has been calculated using the CKD EPI equation.                          This calculation has not been validated in all clinical situations.                          eGFR's persistently <90 mL/min signify possible Chronic Kidney                          Disease.     Assessment/Plan 1. BENIGN PROSTATIC HYPERTROPHY, HX OF Scheduled for TURP. Patient is considered low risk and is approved for surgery.  2. HYPERTENSION Controlled  3. Other and unspecified  hyperlipidemia Controlled  4. Coronary atherosclerosis of native coronary artery Asymptomatic  5. G E R D Controlled

## 2013-08-11 NOTE — Patient Instructions (Addendum)
Continue current medications Proceed with surgery as scheduled by Dr. Gaynelle Arabian.

## 2013-08-13 NOTE — Telephone Encounter (Signed)
This form has been completed, signed by CY, and faxed back. Thanks.

## 2013-08-13 NOTE — Telephone Encounter (Signed)
Please advise Dr. Young thanks 

## 2013-08-13 NOTE — Telephone Encounter (Signed)
Brent Griffith with Dr. Arlyn Leak office is calling again checking on the status of medical surgery clearance. Patient is going to have a TURP and needs clearance. Please call with questions [phone]  And Fax # is [phone].

## 2013-08-13 NOTE — Telephone Encounter (Signed)
Ok, will close encounter

## 2013-08-13 NOTE — Telephone Encounter (Signed)
Pam from Dr Gaynelle Arabian office called, hasn't heard anything in regards to he surgical clearance. Needs to know status of this.  [phone] ext [phone].

## 2013-08-19 ENCOUNTER — Other Ambulatory Visit: Payer: Self-pay | Admitting: Urology

## 2013-08-27 NOTE — Telephone Encounter (Signed)
[facility] has received clearance.

## 2013-09-15 ENCOUNTER — Encounter (HOSPITAL_COMMUNITY): Payer: Self-pay | Admitting: Pharmacy Technician

## 2013-09-15 NOTE — Patient Instructions (Addendum)
CORTLIN MARANO  09/15/2013   Your procedure is scheduled on:  09/24/2013  [id number]  Report to [facility] at    [place]  AM.  Call this number if you have problems the morning of surgery: [phone]   Remember:   Do not eat food or drink liquids after midnight.   Take these medicines the morning of surgery with A SIP OF WATER:    Do not wear jewelry,   Do not wear lotions, powders, or perfumes.    Men may shave face and neck.  Do not bring valuables to the hospital.  Contacts, dentures or bridgework may not be worn into surgery.  Leave suitcase in the car. After surgery it may be brought to your room.  For patients admitted to the hospital, checkout time is 11:00 AM the day of  discharge.      Please read over the following fact sheets that you were given: - Preparing for Surgery Before surgery, you can play an important role.  Because skin is not sterile, your skin needs to be as free of germs as possible.  You can reduce the number of germs on your skin by washing with CHG (chlorahexidine gluconate) soap before surgery.  CHG is an antiseptic cleaner which kills germs and bonds with the skin to continue killing germs even after washing. Please DO NOT use if you have an allergy to CHG or antibacterial soaps.  If your skin becomes reddened/irritated stop using the CHG and inform your nurse when you arrive at Short Stay. Do not shave (including legs and underarms) for at least 48 hours prior to the first CHG shower.  You may shave your face. Please follow these instructions carefully:  1.  Shower with CHG Soap the night before surgery and the  morning of Surgery.  2.  If you choose to wash your hair, wash your hair first as usual with your  normal  shampoo.  3.  After you shampoo, rinse your hair and body thoroughly to remove the  shampoo.                           4.  Use CHG as you would any other liquid soap.  You can apply chg directly  to the skin and wash                        Gently with a scrungie or clean washcloth.  5.  Apply the CHG Soap to your body ONLY FROM THE NECK DOWN.   Do not use on open                           Wound or open sores. Avoid contact with eyes, ears mouth and genitals (private parts).                        Genitals (private parts) with your normal soap.             6.  Wash thoroughly, paying special attention to the area where your surgery  will be performed.  7.  Thoroughly rinse your body with warm water from the neck down.  8.  DO NOT shower/wash with your normal soap after using and rinsing off  the CHG Soap.  9.  Pat yourself dry with a clean towel.            10.  Wear clean pajamas.            11.  Place clean sheets on your bed the night of your first shower and do not  sleep with pets. Day of Surgery : Do not apply any lotions/deodorants the morning of surgery.  Please wear clean clothes to the hospital/surgery center.  FAILURE TO FOLLOW THESE INSTRUCTIONS MAY RESULT IN THE CANCELLATION OF YOUR SURGERY PATIENT SIGNATURE_________________________________  NURSE SIGNATURE__________________________________  ________________________________________________________________________   Adam Phenix  An incentive spirometer is a tool that can help keep your lungs clear and active. This tool measures how well you are filling your lungs with each breath. Taking long deep breaths may help reverse or decrease the chance of developing breathing (pulmonary) problems (especially infection) following:  A long period of time when you are unable to move or be active. BEFORE THE PROCEDURE   If the spirometer includes an indicator to show your best effort, your nurse or respiratory therapist will set it to a desired goal.  If possible, sit up straight or lean slightly forward. Try not to slouch.  Hold the incentive spirometer in an upright position. INSTRUCTIONS FOR USE  1. Sit on the edge of  your bed if possible, or sit up as far as you can in bed or on a chair. 2. Hold the incentive spirometer in an upright position. 3. Breathe out normally. 4. Place the mouthpiece in your mouth and seal your lips tightly around it. 5. Breathe in slowly and as deeply as possible, raising the piston or the ball toward the top of the column. 6. Hold your breath for 3-5 seconds or for as long as possible. Allow the piston or ball to fall to the bottom of the column. 7. Remove the mouthpiece from your mouth and breathe out normally. 8. Rest for a few seconds and repeat Steps 1 through 7 at least 10 times every 1-2 hours when you are awake. Take your time and take a few normal breaths between deep breaths. 9. The spirometer may include an indicator to show your best effort. Use the indicator as a goal to work toward during each repetition. 10. After each set of 10 deep breaths, practice coughing to be sure your lungs are clear. If you have an incision (the cut made at the time of surgery), support your incision when coughing by placing a pillow or rolled up towels firmly against it. Once you are able to get out of bed, walk around indoors and cough well. You may stop using the incentive spirometer when instructed by your caregiver.  RISKS AND COMPLICATIONS  Take your time so you do not get dizzy or light-headed.  If you are in pain, you may need to take or ask for pain medication before doing incentive spirometry. It is harder to take a deep breath if you are having pain. AFTER USE  Rest and breathe slowly and easily.  It can be helpful to keep track of a log of your progress. Your caregiver can provide you with a simple table to help with this. If you are using the spirometer at home, follow these instructions: [place] IF:   You are having difficultly using the spirometer.  You have trouble using the spirometer as often as instructed.  Your pain medication is not giving enough relief  while using the spirometer.  You  develop fever of 100.5 F (38.1 C) or higher. SEEK IMMEDIATE MEDICAL CARE IF:   You cough up bloody sputum that had not been present before.  You develop fever of 102 F (38.9 C) or greater.  You develop worsening pain at or near the incision site. MAKE SURE YOU:   Understand these instructions.  Will watch your condition.  Will get help right away if you are not doing well or get worse. Document Released: 09/03/2006 Document Revised: 07/16/2011 Document Reviewed: 11/04/2006 [facility] Patient Information 2014 [place], [place].   ________________________________________________________________________

## 2013-09-16 ENCOUNTER — Encounter (HOSPITAL_COMMUNITY): Payer: Self-pay

## 2013-09-16 ENCOUNTER — Encounter (HOSPITAL_COMMUNITY)
Admission: RE | Admit: 2013-09-16 | Discharge: 2013-09-16 | Disposition: A | Discharge status: Home / Self Care | Payer: BC Managed Care – PPO | Source: Ambulatory Visit | Attending: Urology | Admitting: Urology

## 2013-09-16 ENCOUNTER — Encounter (HOSPITAL_COMMUNITY): Payer: Self-pay | Admitting: Pharmacy Technician

## 2013-09-16 ENCOUNTER — Encounter (INDEPENDENT_AMBULATORY_CARE_PROVIDER_SITE_OTHER): Payer: Self-pay

## 2013-09-16 DIAGNOSIS — Z01812 Encounter for preprocedural laboratory examination: Secondary | ICD-10-CM | POA: Insufficient documentation

## 2013-09-16 HISTORY — DX: Unspecified osteoarthritis, unspecified site: M19.90

## 2013-09-16 HISTORY — DX: Pneumonia, unspecified organism: J18.9

## 2013-09-16 LAB — CBC
HCT: 37.8 % — ABNORMAL LOW (ref 39.0–52.0)
Hemoglobin: 13 g/dL (ref 13.0–17.0)
MCH: 29.2 pg (ref 26.0–34.0)
MCHC: 34.4 g/dL (ref 30.0–36.0)
MCV: 84.9 fL (ref 78.0–100.0)
PLATELETS: 179 10*3/uL (ref 150–400)
RBC: 4.45 MIL/uL (ref 4.22–5.81)
RDW: 12.4 % (ref 11.5–15.5)
WBC: 6 10*3/uL (ref 4.0–10.5)

## 2013-09-16 LAB — BASIC METABOLIC PANEL
BUN: 13 mg/dL (ref 6–23)
CHLORIDE: 105 meq/L (ref 96–112)
CO2: 24 mEq/L (ref 19–32)
Calcium: 8.8 mg/dL (ref 8.4–10.5)
Creatinine, Ser: 0.91 mg/dL (ref 0.50–1.35)
GFR calc non Af Amer: 81 mL/min — ABNORMAL LOW (ref >90)
Glucose, Bld: 106 mg/dL — ABNORMAL HIGH (ref 70–99)
POTASSIUM: 4.1 meq/L (ref 3.7–5.3)
Sodium: 140 mEq/L (ref 137–147)

## 2013-09-16 NOTE — Progress Notes (Signed)
Last office visit with Dr Stanford Breed- 05/01/13- [facility]  Dr Art Nyoka Cowden 08/11/2013 - on chart  Stress 05/20/13- [facility]  Clearance- Dr Stanford Breed on chart with stress test  Dr Glynn Octave- last office visit- 03/2013- [facility]

## 2013-09-23 NOTE — Anesthesia Preprocedure Evaluation (Addendum)
Anesthesia Evaluation  Patient identified by MRN, date of birth, ID band Patient awake    Reviewed: Allergy & Precautions, H&P , NPO status , Patient's Chart, lab work & pertinent test results  Airway Mallampati: II TM Distance: <3 FB Neck ROM: Full    Dental no notable dental hx.    Pulmonary sleep apnea and Continuous Positive Airway Pressure Ventilation ,  breath sounds clear to auscultation  Pulmonary exam normal       Cardiovascular hypertension, Pt. on medications and Pt. on home beta blockers + CAD and + CABG Rhythm:Regular Rate:Normal  Overall Impression:  Low risk stress nuclear study with large, severe, partially reversible anterior, lateral and inferior defects consistent with prior infarct and mild peri-infarct ischemia.   LV Ejection Fraction: 50%.  LV Wall Motion:  Hypokinesis of the anterolateral wall.      Neuro/Psych negative neurological ROS  negative psych ROS   GI/Hepatic Neg liver ROS, GERD-  Medicated,  Endo/Other  negative endocrine ROS  Renal/GU negative Renal ROS  negative genitourinary   Musculoskeletal negative musculoskeletal ROS (+)   Abdominal   Peds negative pediatric ROS (+)  Hematology negative hematology ROS (+)   Anesthesia Other Findings   Reproductive/Obstetrics negative OB ROS                         Anesthesia Physical Anesthesia Plan  ASA: III  Anesthesia Plan: General   Post-op Pain Management:    Induction: Intravenous  Airway Management Planned: LMA  Additional Equipment:   Intra-op Plan:   Post-operative Plan: Extubation in OR  Informed Consent: I have reviewed the patients History and Physical, chart, labs and discussed the procedure including the risks, benefits and alternatives for the proposed anesthesia with the patient or authorized representative who has indicated his/her understanding and acceptance.   Dental advisory  given  Plan Discussed with: CRNA and Surgeon  Anesthesia Plan Comments:         Anesthesia Quick Evaluation

## 2013-09-24 ENCOUNTER — Inpatient Hospital Stay (HOSPITAL_COMMUNITY)
Admission: RE | Admit: 2013-09-24 | Discharge: 2013-09-26 | DRG: 714 | Disposition: A | Discharge status: Home / Self Care | Payer: BC Managed Care – PPO | Source: Ambulatory Visit | Attending: Urology | Admitting: Urology

## 2013-09-24 ENCOUNTER — Encounter (HOSPITAL_COMMUNITY): Payer: Self-pay | Admitting: *Deleted

## 2013-09-24 ENCOUNTER — Encounter (HOSPITAL_COMMUNITY)
Admission: RE | Disposition: A | Discharge status: Home / Self Care | Payer: Self-pay | Source: Ambulatory Visit | Attending: Urology

## 2013-09-24 ENCOUNTER — Encounter (HOSPITAL_COMMUNITY): Payer: BC Managed Care – PPO | Admitting: Anesthesiology

## 2013-09-24 ENCOUNTER — Inpatient Hospital Stay (HOSPITAL_COMMUNITY): Payer: BC Managed Care – PPO | Admitting: Anesthesiology

## 2013-09-24 DIAGNOSIS — Z79899 Other long term (current) drug therapy: Secondary | ICD-10-CM

## 2013-09-24 DIAGNOSIS — Z01812 Encounter for preprocedural laboratory examination: Secondary | ICD-10-CM | POA: Diagnosis not present

## 2013-09-24 DIAGNOSIS — G4730 Sleep apnea, unspecified: Secondary | ICD-10-CM | POA: Diagnosis present

## 2013-09-24 DIAGNOSIS — Z7982 Long term (current) use of aspirin: Secondary | ICD-10-CM | POA: Diagnosis not present

## 2013-09-24 DIAGNOSIS — N138 Other obstructive and reflux uropathy: Secondary | ICD-10-CM | POA: Diagnosis present

## 2013-09-24 DIAGNOSIS — N401 Enlarged prostate with lower urinary tract symptoms: Secondary | ICD-10-CM | POA: Diagnosis present

## 2013-09-24 DIAGNOSIS — R972 Elevated prostate specific antigen [PSA]: Secondary | ICD-10-CM | POA: Diagnosis present

## 2013-09-24 DIAGNOSIS — I2510 Atherosclerotic heart disease of native coronary artery without angina pectoris: Secondary | ICD-10-CM | POA: Diagnosis present

## 2013-09-24 DIAGNOSIS — K219 Gastro-esophageal reflux disease without esophagitis: Secondary | ICD-10-CM | POA: Diagnosis present

## 2013-09-24 DIAGNOSIS — I10 Essential (primary) hypertension: Secondary | ICD-10-CM | POA: Diagnosis present

## 2013-09-24 DIAGNOSIS — Z951 Presence of aortocoronary bypass graft: Secondary | ICD-10-CM

## 2013-09-24 DIAGNOSIS — Z8711 Personal history of peptic ulcer disease: Secondary | ICD-10-CM | POA: Diagnosis not present

## 2013-09-24 DIAGNOSIS — Z823 Family history of stroke: Secondary | ICD-10-CM | POA: Diagnosis not present

## 2013-09-24 DIAGNOSIS — N400 Enlarged prostate without lower urinary tract symptoms: Secondary | ICD-10-CM | POA: Diagnosis present

## 2013-09-24 DIAGNOSIS — N139 Obstructive and reflux uropathy, unspecified: Secondary | ICD-10-CM | POA: Diagnosis present

## 2013-09-24 HISTORY — PX: TRANSURETHRAL RESECTION OF PROSTATE: SHX73

## 2013-09-24 SURGERY — TURP (TRANSURETHRAL RESECTION OF PROSTATE)
Anesthesia: General

## 2013-09-24 MED ORDER — CEFAZOLIN SODIUM-DEXTROSE 2-3 GM-% IV SOLR
INTRAVENOUS | Status: AC
  Filled 2013-09-24: qty 50

## 2013-09-24 MED ORDER — LIDOCAINE HCL 1 % IJ SOLN
INTRAMUSCULAR | Status: DC | PRN
  Administered 2013-09-24: 50 mg via INTRADERMAL

## 2013-09-24 MED ORDER — PHENYLEPHRINE HCL 10 MG/ML IJ SOLN
INTRAMUSCULAR | Status: AC
  Filled 2013-09-24: qty 1

## 2013-09-24 MED ORDER — ACETAMINOPHEN 500 MG PO TABS: 1000.0000 mg | ORAL_TABLET | Freq: Four times a day (QID) | ORAL | Status: DC | PRN

## 2013-09-24 MED ORDER — MIDAZOLAM HCL 5 MG/5ML IJ SOLN
INTRAMUSCULAR | Status: DC | PRN
  Administered 2013-09-24: 1 mg via INTRAVENOUS

## 2013-09-24 MED ORDER — OXYBUTYNIN CHLORIDE 5 MG PO TABS
5.0000 mg | ORAL_TABLET | Freq: Three times a day (TID) | ORAL | Status: DC | PRN
  Administered 2013-09-25 – 2013-09-26 (×2): 5 mg via ORAL
  Filled 2013-09-24 (×2): qty 1

## 2013-09-24 MED ORDER — DIPHENHYDRAMINE HCL 12.5 MG/5ML PO ELIX: 12.5000 mg | ORAL_SOLUTION | Freq: Four times a day (QID) | ORAL | Status: DC | PRN

## 2013-09-24 MED ORDER — PHENYLEPHRINE HCL 10 MG/ML IJ SOLN
INTRAMUSCULAR | Status: DC | PRN
  Administered 2013-09-24 (×2): 40 ug via INTRAVENOUS

## 2013-09-24 MED ORDER — PROPOFOL 10 MG/ML IV BOLUS
INTRAVENOUS | Status: DC | PRN
  Administered 2013-09-24: 100 mg via INTRAVENOUS

## 2013-09-24 MED ORDER — SODIUM CHLORIDE 0.9 % IJ SOLN
INTRAMUSCULAR | Status: AC
  Filled 2013-09-24: qty 10

## 2013-09-24 MED ORDER — BACITRACIN-NEOMYCIN-POLYMYXIN 400-5-5000 EX OINT: 1.0000 "application " | TOPICAL_OINTMENT | Freq: Three times a day (TID) | CUTANEOUS | Status: DC | PRN

## 2013-09-24 MED ORDER — FENTANYL CITRATE 0.05 MG/ML IJ SOLN
25.0000 ug | INTRAMUSCULAR | Status: DC | PRN
  Administered 2013-09-24 (×4): 50 ug via INTRAVENOUS

## 2013-09-24 MED ORDER — LIDOCAINE HCL (CARDIAC) 20 MG/ML IV SOLN
INTRAVENOUS | Status: AC
  Filled 2013-09-24: qty 5

## 2013-09-24 MED ORDER — DIPHENHYDRAMINE HCL 50 MG/ML IJ SOLN: 12.5000 mg | Freq: Four times a day (QID) | INTRAMUSCULAR | Status: DC | PRN

## 2013-09-24 MED ORDER — MIDAZOLAM HCL 2 MG/2ML IJ SOLN
INTRAMUSCULAR | Status: AC
  Filled 2013-09-24: qty 2

## 2013-09-24 MED ORDER — SODIUM CHLORIDE 0.45 % IV SOLN
INTRAVENOUS | Status: DC
  Administered 2013-09-24 – 2013-09-25 (×2): via INTRAVENOUS

## 2013-09-24 MED ORDER — LACTATED RINGERS IV SOLN
INTRAVENOUS | Status: DC | PRN
  Administered 2013-09-24: 08:00:00 via INTRAVENOUS

## 2013-09-24 MED ORDER — KETOROLAC TROMETHAMINE 30 MG/ML IJ SOLN: 15.0000 mg | Freq: Once | INTRAMUSCULAR | Status: DC | PRN

## 2013-09-24 MED ORDER — ONDANSETRON HCL 4 MG/2ML IJ SOLN: 4.0000 mg | INTRAMUSCULAR | Status: DC | PRN

## 2013-09-24 MED ORDER — POTASSIUM GLUCONATE 595 (99 K) MG PO TABS
595.0000 mg | ORAL_TABLET | Freq: Every day | ORAL | Status: DC
  Administered 2013-09-24 – 2013-09-26 (×3): 595 mg via ORAL
  Filled 2013-09-24 (×3): qty 1

## 2013-09-24 MED ORDER — ADULT MULTIVITAMIN W/MINERALS CH
1.0000 | ORAL_TABLET | Freq: Every day | ORAL | Status: DC
  Administered 2013-09-24 – 2013-09-26 (×3): 1 via ORAL
  Filled 2013-09-24 (×3): qty 1

## 2013-09-24 MED ORDER — FENTANYL CITRATE 0.05 MG/ML IJ SOLN
INTRAMUSCULAR | Status: AC
  Filled 2013-09-24: qty 2

## 2013-09-24 MED ORDER — LACTATED RINGERS IV SOLN
INTRAVENOUS | Status: DC
  Administered 2013-09-24: 1000 mL via INTRAVENOUS

## 2013-09-24 MED ORDER — ATENOLOL 50 MG PO TABS
50.0000 mg | ORAL_TABLET | Freq: Every morning | ORAL | Status: DC
  Administered 2013-09-25 – 2013-09-26 (×2): 50 mg via ORAL
  Filled 2013-09-24 (×2): qty 1

## 2013-09-24 MED ORDER — CIPROFLOXACIN HCL 500 MG PO TABS
500.0000 mg | ORAL_TABLET | Freq: Two times a day (BID) | ORAL | Status: DC
  Administered 2013-09-24 – 2013-09-26 (×5): 500 mg via ORAL
  Filled 2013-09-24 (×6): qty 1

## 2013-09-24 MED ORDER — SODIUM CHLORIDE 0.9 % IR SOLN
Status: DC | PRN
  Administered 2013-09-24: 18000 mL via INTRAVESICAL

## 2013-09-24 MED ORDER — BISACODYL 10 MG RE SUPP: 10.0000 mg | Freq: Every day | RECTAL | Status: DC | PRN

## 2013-09-24 MED ORDER — FINASTERIDE 5 MG PO TABS
5.0000 mg | ORAL_TABLET | Freq: Every morning | ORAL | Status: DC
  Administered 2013-09-25 – 2013-09-26 (×2): 5 mg via ORAL
  Filled 2013-09-24 (×2): qty 1

## 2013-09-24 MED ORDER — PHENYLEPHRINE 40 MCG/ML (10ML) SYRINGE FOR IV PUSH (FOR BLOOD PRESSURE SUPPORT)
PREFILLED_SYRINGE | INTRAVENOUS | Status: AC
  Filled 2013-09-24: qty 10

## 2013-09-24 MED ORDER — ATORVASTATIN CALCIUM 10 MG PO TABS
10.0000 mg | ORAL_TABLET | Freq: Every day | ORAL | Status: DC
  Administered 2013-09-24 – 2013-09-25 (×2): 10 mg via ORAL
  Filled 2013-09-24 (×3): qty 1

## 2013-09-24 MED ORDER — CEFAZOLIN SODIUM-DEXTROSE 2-3 GM-% IV SOLR
2.0000 g | INTRAVENOUS | Status: AC
  Administered 2013-09-24: 2 g via INTRAVENOUS

## 2013-09-24 MED ORDER — OXYCODONE-ACETAMINOPHEN 5-325 MG PO TABS
1.0000 | ORAL_TABLET | ORAL | Status: DC | PRN
  Administered 2013-09-25 – 2013-09-26 (×2): 2 via ORAL
  Filled 2013-09-24 (×2): qty 2

## 2013-09-24 MED ORDER — EPHEDRINE SULFATE 50 MG/ML IJ SOLN
INTRAMUSCULAR | Status: AC
  Filled 2013-09-24: qty 1

## 2013-09-24 MED ORDER — BELLADONNA ALKALOIDS-OPIUM 16.2-60 MG RE SUPP
RECTAL | Status: DC | PRN
  Administered 2013-09-24: 1 via RECTAL

## 2013-09-24 MED ORDER — DIPHENHYDRAMINE HCL 50 MG PO CAPS: 50.0000 mg | ORAL_CAPSULE | Freq: Four times a day (QID) | ORAL | Status: DC | PRN

## 2013-09-24 MED ORDER — BELLADONNA ALKALOIDS-OPIUM 16.2-60 MG RE SUPP
RECTAL | Status: AC
  Filled 2013-09-24: qty 1

## 2013-09-24 MED ORDER — PROPOFOL 10 MG/ML IV BOLUS
INTRAVENOUS | Status: AC
  Filled 2013-09-24: qty 20

## 2013-09-24 MED ORDER — PROMETHAZINE HCL 25 MG/ML IJ SOLN: 6.2500 mg | INTRAMUSCULAR | Status: DC | PRN

## 2013-09-24 MED ORDER — PANTOPRAZOLE SODIUM 40 MG PO TBEC
40.0000 mg | DELAYED_RELEASE_TABLET | Freq: Every day | ORAL | Status: DC
  Administered 2013-09-25 – 2013-09-26 (×2): 40 mg via ORAL
  Filled 2013-09-24 (×2): qty 1

## 2013-09-24 MED ORDER — HYDROMORPHONE HCL PF 1 MG/ML IJ SOLN: 0.5000 mg | INTRAMUSCULAR | Status: DC | PRN

## 2013-09-24 MED ORDER — SENNOSIDES-DOCUSATE SODIUM 8.6-50 MG PO TABS
2.0000 | ORAL_TABLET | Freq: Every day | ORAL | Status: DC
  Administered 2013-09-24: 2 via ORAL
  Filled 2013-09-24 (×3): qty 2

## 2013-09-24 MED ORDER — SODIUM CHLORIDE 0.9 % IV SOLN
10.0000 mg | INTRAVENOUS | Status: DC | PRN
  Administered 2013-09-24: 10 ug/min via INTRAVENOUS

## 2013-09-24 MED ORDER — FENTANYL CITRATE 0.05 MG/ML IJ SOLN
INTRAMUSCULAR | Status: DC | PRN
  Administered 2013-09-24: 50 ug via INTRAVENOUS
  Administered 2013-09-24: 25 ug via INTRAVENOUS

## 2013-09-24 MED ORDER — POTASSIUM 99 MG PO TABS: 99.0000 mg | ORAL_TABLET | Freq: Every day | ORAL | Status: DC

## 2013-09-24 SURGICAL SUPPLY — 29 items
BAG URINE DRAINAGE (UROLOGICAL SUPPLIES) ×2 IMPLANT
BAG URO CATCHER STRL LF (DRAPE) ×2 IMPLANT
BLADE SURG 15 STRL LF DISP TIS (BLADE) IMPLANT
BLADE SURG 15 STRL SS (BLADE)
CATH AINSWORTH 30CC 24FR (CATHETERS) IMPLANT
CATH FOLEY 3WAY 30CC 24FR (CATHETERS)
CATH HEMA 3WAY 30CC 22FR COUDE (CATHETERS) ×2 IMPLANT
CATH URO 16X24FR 3W FL PS (CATHETERS) IMPLANT
DRAPE CAMERA CLOSED 9X96 (DRAPES) ×2 IMPLANT
ELECT BUTTON HF 24-28F 2 30DE (ELECTRODE) IMPLANT
ELECT LOOP MED HF 24F 12D (CUTTING LOOP) IMPLANT
ELECT LOOP MED HF 24F 12D CBL (CLIP) ×2 IMPLANT
ELECT RESECT VAPORIZE 12D CBL (ELECTRODE) IMPLANT
GLOVE BIOGEL M STRL SZ7.5 (GLOVE) ×2 IMPLANT
GOWN STRL REUS W/TWL LRG LVL3 (GOWN DISPOSABLE) IMPLANT
GOWN STRL REUS W/TWL XL LVL3 (GOWN DISPOSABLE) ×2 IMPLANT
HOLDER FOLEY CATH W/STRAP (MISCELLANEOUS) ×2 IMPLANT
KIT ASPIRATION TUBING (SET/KITS/TRAYS/PACK) ×2 IMPLANT
KIT SUPRAPUBIC CATH (MISCELLANEOUS) IMPLANT
MANIFOLD NEPTUNE II (INSTRUMENTS) ×2 IMPLANT
MARKER SKIN DUAL TIP RULER LAB (MISCELLANEOUS) IMPLANT
NEEDLE SPNL 18GX3.5 QUINCKE PK (NEEDLE) IMPLANT
NS IRRIG 1000ML POUR BTL (IV SOLUTION) IMPLANT
PACK CYSTO (CUSTOM PROCEDURE TRAY) ×2 IMPLANT
SCRUB PCMX 4 OZ (MISCELLANEOUS) IMPLANT
SUT ETHILON 3 0 PS 1 (SUTURE) IMPLANT
SYR 30ML LL (SYRINGE) IMPLANT
SYRINGE IRR TOOMEY STRL 70CC (SYRINGE) ×2 IMPLANT
TUBING CONNECTING 10 (TUBING) ×2 IMPLANT

## 2013-09-24 NOTE — H&P (Signed)
Active Problems Problems  1. Elevated prostate specific antigen (PSA) (790.93)   Assessed By: Carolan Clines (Urology); Last Assessed: 05 Aug 2013 2. Obstructive uropathy (599.60)   Assessed By: Carolan Clines (Urology); Last Assessed: 05 Aug 2013  History of Present Illness 78 yo male returns today for a 3 mo f/u, flowrate, PVR & PUS after stopping Avodart. Prostate size has increased from 100 grams to 108 grams in 1 year. Flow rate peak is 5cc/sec, and pvr=160cc. IPSS=15.     Hx of elevated PSA & ED. He has had an elevated PSA with 2 negative biopsies, one in 1993 and the other in September 2006. Has had some atypical cells but benign tissue on the last one. PSA was 4.3 at that time. He has been on Avodart and doxazosin for his obstructive uropathy. He started Avodart in January 2006 and he has been on doxazosin even longer. On this maximum medical therapy he seems to be voiding adequately, but he gets up anywhere from 0 to 4 times at night, 1-2 is usually the average. IPSS has risen to 21, even on Avodart.     He does have some erectile problems but is not desirous of treatment. His wife is 84 years younger but overweight and not in very good health. He also has sleep apnea, uses CPAP, and doing well. His PSA in September 2007 was 2.96 and 2.4 in March 2009, and 3.06 Sep 2008. His PSA was up to 4.09 November 2009, but down to 4.06 October 2010. He is still working. 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    10/22/12 PSA - 1.93 (true PSA of 3.86 due to taking Avodart)   Past Medical History Problems  1. History of Arthritis (V13.4) 2. History of Asthma (493.90) 3. History of esophageal reflux (V12.79) 4. History of hypertension (V12.59) 5. History of sleep apnea (V13.89) 6. History of Peptic Ulcer (V12.71)  Surgical History Problems  1. History of Previous Coronary Artery Bypass 2.  History of Shoulder Surgery 3. History of Tonsillectomy  Current Meds 1. Acetaminophen-Codeine #3 300-30 MG Oral Tablet;  Therapy: [id number] to Recorded 2. Aspirin 325 MG Oral Tablet;  Therapy: (Recorded:29Jul2011) to Recorded 3. Astepro 0.15 % Nasal Solution;  Therapy: [id number] to Recorded 4. Atenolol 50 MG Oral Tablet;  Therapy: (Recorded:13Mar2009) to Recorded 5. Calcium TABS;  Therapy: (Recorded:07Mar2008) to Recorded 6. C-Complex TABS;  Therapy: (Recorded:07Mar2008) to Recorded 7. Co Q 10 CAPS;  Therapy: (Recorded:13Mar2009) to Recorded 8. Crestor 20 MG Oral Tablet;  Therapy: (Recorded:29Jul2011) to Recorded 9. Doxazosin Mesylate 4 MG Oral Tablet; TAKE 1 TABLET Bedtime   Requested for: [id number]; Last Rx:19Jun2013 Ordered 10. Finasteride 5 MG Oral Tablet; TAKE 1 TABLET DAILY AS DIRECTED;   Therapy: 30Sep2014 to (Evaluate:25Sep2015)  Requested for:   30Sep2014; Last Rx:30Sep2014 Ordered 11. Fish Oil CAPS;   Therapy: (Recorded:07Mar2008) to Recorded 12. Glucosamine CAPS;   Therapy: (Recorded:07Mar2008) to Recorded 13. Methocarbamol 750 MG Oral Tablet;   Therapy: 03Feb2011 to Recorded 14. Multi-Day Vitamins TABS;   Therapy: (Recorded:07Mar2008) to Recorded 15. Nasacort AQ 55 MCG/ACT AERS;   Therapy: (Recorded:07Mar2008) to Recorded 16. Omeprazole 20 MG Oral Capsule Delayed Release;   Therapy: (Recorded:07Mar2008) to Recorded 17. Optivar 0.05 % SOLN;   Therapy: (Recorded:07Mar2008) to Recorded 18. ProAir HFA 108 (90 Base) MCG/ACT Inhalation Aerosol Solution;   Therapy: [id number] to Recorded 19. Prolia 60 MG/ML  Subcutaneous Solution;   Therapy: (Recorded:19Jun2013) to Recorded 20. Saw Palmetto CAPS;   Therapy: (Recorded:19Jun2013) to Recorded 21. Singulair TABS;   Therapy: (Recorded:07Mar2008) to Recorded 22. Xyzal 5 MG Oral Tablet;   Therapy: (Recorded:19Jun2013) to Recorded  Allergies Medication  1. No Known Drug Allergies  Family History Problems  1. Family  history of Chronic Renal Failure : Father 2. Family history of Death In The Family Father   renal failure 3. Family history of Death In The Family Mother   stroke 4. Family history of Tuberculosis : Father  Social History Problems  1. Alcohol Use   1-2 a month 2. Caffeine Use   1 cola 3. Family history of Death In The Family Mother   age 87 stroke 30. Marital History - Currently Married 5. Never A Smoker 6. Occupation:   Chief Financial Officer 7. Denied: Tobacco Use  Review of Systems  Genitourinary: urinary frequency, feelings of urinary urgency, nocturia, difficulty starting the urinary stream, weak urinary stream, urinary stream starts and stops, incomplete emptying of bladder and initiating urination requires straining, but no dysuria, no incontinence and no post-void dribbling.    Vitals Vital Signs [Data Includes: Last 1 Day]  Recorded: 01Apr2015 04:13PM  Blood Pressure: 116 / 69 Temperature: 98 F Heart Rate: 73  Physical Exam Constitutional: Well nourished and well developed . No acute distress.  ENT:. The ears and nose are normal in appearance.  Neck: The appearance of the neck is normal and no neck mass is present.  Pulmonary: No respiratory distress and normal respiratory rhythm and effort.  Cardiovascular:. No peripheral edema.  Abdomen: The abdomen is soft and nontender. No masses are palpated. No CVA tenderness. No hernias are palpable. No hepatosplenomegaly noted.  Rectal: Rectal exam demonstrates normal sphincter tone, no tenderness and no masses. Estimated prostate size is 4+. Firm large prostate. The prostate has no nodularity and is not tender. The left seminal vesicle is nonpalpable. The right seminal vesicle is nonpalpable. The perineum is normal on inspection.  Genitourinary: Examination of the penis demonstrates no discharge, no masses, no lesions and a normal meatus. The scrotum is without lesions. The right epididymis is palpably normal and non-tender. The left  epididymis is palpably normal and non-tender. The right testis is non-tender and without masses. The left testis is non-tender and without masses.  Lymphatics: The femoral and inguinal nodes are not enlarged or tender.  Skin: Normal skin turgor, no visible rash and no visible skin lesions.  Neuro/Psych:. Mood and affect are appropriate.    Procedure Flow Rate: Peak: 5cc/sec  PVR by u/s: 160cc.     Assessment Assessed  1. Elevated prostate specific antigen (PSA) (790.93) 2. Obstructive uropathy (599.60) 3. Nocturia (788.43)  78 yo male with BPH, and sleep apnea. He feels that his BPH is worsening, despite 6 months of added finasteride. We have discussed possible change in medication to Flomax in connection with finasteride, but he would rather go ahead with TURP. He has spoken with Dr. Nyoka Cowden about this already.    He has been cleared by Dr. Stanford Breed for TURP, and by Dr. Nyoka Cowden. He will get report from sleep apnea Dr. ( Dr. Keturah Barre). Pt is ready for TURP. We have discussed incontinence, and how surgery is done. He will need hospitalization for 2 days. We will plan for least resection necessary. He will need to bring his sleep apnea mask with him to surgery. ( 40 minutes)   Plan Get medical clearances and schedule TURP.   Discussion/Summary  cc: Dr.Arthur Kem Kays Electronically signed by : Carolan Clines, M.D.; Aug 05 2013  4:42PM EST

## 2013-09-24 NOTE — Op Note (Signed)
Pre-operative diagnosis :   BPH  Postoperative diagnosis:  Same Operation:  Staged TURP (right lateral lobe, bladder neck, median lobe)  Surgeon:  S. Gaynelle Arabian, MD  First assistant:  None  Anesthesia:  General LMA  Preparation:  After appropriate preanesthesia, the patient was brought to the operating room, placed in the upper table in the dorsal supine position where general LMA anesthesia was introduced. He was replaced in dorsal lithotomy position with pubis was prepped with antibiotic soaked solution, and draped in usual fashion.  Review history:  prostate specific antigen (PSA) (790.93)   Assessed By: Carolan Clines (Urology); Last Assessed: 05 Aug 2013  2. Obstructive uropathy (599.60)   Assessed By: Carolan Clines (Urology); Last Assessed: 05 Aug 2013  History of Present Illness  78 yo male returns today for a 3 mo f/u, flowrate, PVR & PUS after stopping Avodart. Prostate size has increased from 100 grams to 108 grams in 1 year. Flow rate peak is 5cc/sec, and pvr=160cc. IPSS=15.  Hx of elevated PSA & ED. He has had an elevated PSA with 2 negative biopsies, one in 1993 and the other in September 2006. Has had some atypical cells but benign tissue on the last one. PSA was 4.3 at that time. He has been on Avodart and doxazosin for his obstructive uropathy. He started Avodart in January 2006 and he has been on doxazosin even longer. On this maximum medical therapy he seems to be voiding adequately, but he gets up anywhere from 0 to 4 times at night, 1-2 is usually the average. IPSS has risen to 21, even on Avodart.  He does have some erectile problems but is not desirous of treatment. His wife is 79 years younger but overweight and not in very good health. He also has sleep apnea, uses CPAP, and doing well. His PSA in September 2007 was 2.96 and 2.4 in March 2009, and 3.06 Sep 2008. His PSA was up to 4.09 November 2009, but down to 4.06 October 2010. He is still working. 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  10/22/12 PSA - 1.93 (true PSA of 3.86 due to taking Avodart)      Statement of  Likelihood of Success: Excellent. TIME-OUT observed.:  Procedure:  The urethral meatus was dilated to a size 23 [place] with the male sounds, and the 28 cm continuous flow resectoscope sheath was placed without difficulty. Cystourethroscopy revealed trilobar BPH with massive lateral lobe, and a very large median lobe, and very elevated bladder neck. Trabeculation and cellule formation were identified. There was no evidence of bladder stone, or tumor.  Resection was accomplished from the 7:00 to the 5:00 position, and then from the 11:00 to the 7:00 position. Cause a large amount of bleeding, and this was electrocoagulated. A large volume of tissue was resected. The patient was noted preoperatively to have greater than 100 g prostate. After resection of the bladder neck, median lobe, and right lateral lobe, elected to irrigate free from the bladder, and provide extensive electrocoagulation. I then elected to pass catheter, placed to traction and continuous irrigation, in order to seat the patient will be able to void adequately without need for further resection. Is felt that the patient has more complete resection, that he will be incontinent of urine. Therefore, a size 22 continuous flow hematuria catheter is passed without difficulty and the bladder, and placed to traction, with 30 cc  in the balloon.  The chips were then placed in a container, after timeout was observed, in order to give the chips to the surgical tech.  The patient was then awakened, and taken to recovery room in good condition. He received a B. and O. suppository.

## 2013-09-24 NOTE — Anesthesia Postprocedure Evaluation (Signed)
  Anesthesia Post-op Note  Patient: Brent Griffith  Procedure(s) Performed: Procedure(s) (LRB): TRANSURETHRAL RESECTION OF THE PROSTATE (TURP) WITH GYRUS (STAGED RIGHT LATERAL AND MEDIAN LOBE) (N/A)  Patient Location: PACU  Anesthesia Type: General  Level of Consciousness: awake and alert   Airway and Oxygen Therapy: Patient Spontanous Breathing  Post-op Pain: mild  Post-op Assessment: Post-op Vital signs reviewed, Patient's Cardiovascular Status Stable, Respiratory Function Stable, Patent Airway and No signs of Nausea or vomiting  Last Vitals:  Filed Vitals:   09/24/13 1045  BP: 129/66  Pulse: 57  Temp:   Resp: 9    Post-op Vital Signs: stable   Complications: No apparent anesthesia complications

## 2013-09-24 NOTE — Addendum Note (Signed)
Addendum created 09/24/13 1135 by Garrel Ridgel, CRNA   Modules edited: Anesthesia LDA

## 2013-09-24 NOTE — Interval H&P Note (Signed)
History and Physical Interval Note:  09/24/2013 8:16 AM  Brent Griffith  has presented today for surgery, with the diagnosis of BENIGN PROSTATIC HYPERPLASIA (BPH)  The various methods of treatment have been discussed with the patient and family. After consideration of risks, benefits and other options for treatment, the patient has consented to  Procedure(s): TRANSURETHRAL RESECTION OF THE PROSTATE (TURP) WITH GYRUS (N/A) as a surgical intervention .  The patient's history has been reviewed, patient examined, no change in status, stable for surgery.  I have reviewed the patient's chart and labs.  Questions were answered to the patient's satisfaction.     Ailene Rud

## 2013-09-24 NOTE — Transfer of Care (Signed)
Immediate Anesthesia Transfer of Care Note  Patient: Brent Griffith  Procedure(s) Performed: Procedure(s): TRANSURETHRAL RESECTION OF THE PROSTATE (TURP) WITH GYRUS (STAGED RIGHT LATERAL AND MEDIAN LOBE) (N/A)  Patient Location: PACU  Anesthesia Type:General  Level of Consciousness: awake, oriented and patient cooperative  Airway & Oxygen Therapy: Patient Spontanous Breathing and Patient connected to face mask oxygen  Post-op Assessment: Report given to PACU RN, Post -op Vital signs reviewed and stable and Patient moving all extremities  Post vital signs: Reviewed and stable  Complications: No apparent anesthesia complications

## 2013-09-25 ENCOUNTER — Encounter (HOSPITAL_COMMUNITY): Payer: Self-pay | Admitting: Urology

## 2013-09-25 LAB — HEMOGLOBIN AND HEMATOCRIT, BLOOD
HCT: 29.4 % — ABNORMAL LOW (ref 39.0–52.0)
Hemoglobin: 10.2 g/dL — ABNORMAL LOW (ref 13.0–17.0)

## 2013-09-25 NOTE — Progress Notes (Signed)
Patient not ready for CPAP at this time. Stated they would be able to place themselves on without assistance. Explained that I or someone from [facility] would return if needed.

## 2013-09-25 NOTE — Progress Notes (Signed)
Writer hand irrigated foley catheter with 40 cc of normal saline and pulled back 50 cc.  Urine is cherry red and two string- like clots seen.  Patient encouraged to drink plenty of fluids.  Will continue to monitor patient.

## 2013-09-26 MED ORDER — HYDROCODONE-ACETAMINOPHEN 5-325 MG PO TABS: 1.0000 | ORAL_TABLET | Freq: Four times a day (QID) | ORAL | Status: DC | PRN

## 2013-09-26 MED ORDER — CIPROFLOXACIN HCL 250 MG PO TABS: 250.0000 mg | ORAL_TABLET | Freq: Two times a day (BID) | ORAL | Status: DC

## 2013-09-26 NOTE — Discharge Instructions (Signed)
I have reviewed discharge instructions in detail with the patient. They will follow-up with me or their physician as scheduled. My nurse will also be calling the patients as per protocol.   

## 2013-09-26 NOTE — Progress Notes (Signed)
Patient urinated 125 cc of cherry colored urine.  Bladder scanner shows 156 cc in bladder.  This is post void residual trial number one of two.  Will continue to encourage patient to drink plenty of fluids and will continue to monitor patient.

## 2013-09-26 NOTE — Progress Notes (Signed)
Patient urinated 100 cc of cherry colored urine.  Bladder scanner shows 141 cc.  This is post void residual trial number two of two.  Dr. Matilde Sprang made aware and discharged patient home.

## 2013-10-01 NOTE — Discharge Summary (Signed)
  Physician Discharge Summary  Patient ID: Brent Griffith MRN: [id number] DOB/AGE: [birth year] 78 y.o.  Admit date: 09/24/2013 Discharge date: 10/01/2013  Admission Diagnoses: BPH  Discharge Diagnoses:  Active Problems:   Benign hypertrophy of prostate   Discharged Condition: improved  Hospital Course:  TURP  Consults: none  Significant Diagnostic Studies: none Treatments: nopne  Discharge Exam: Blood pressure 147/60, pulse 89, temperature 97.8 F (36.6 C), temperature source Oral, resp. rate 16, height 5\' 6"  (1.676 m), weight 70.761 kg (156 lb), SpO2 98.00%.   Disposition: 01-Home or Self Care     Medication List    STOP taking these medications       aspirin 325 MG EC tablet      TAKE these medications       acetaminophen 500 MG tablet  Commonly known as:  TYLENOL  Take 1,000 mg by mouth every 6 (six) hours as needed for mild pain or moderate pain.     atenolol 50 MG tablet  Commonly known as:  TENORMIN  Take 50 mg by mouth every morning.     Calcium Citrate 250 MG Tabs  Take 1 tablet by mouth daily.     cholecalciferol 1000 UNITS tablet  Commonly known as:  VITAMIN D  Take 1,000 Units by mouth daily.     ciprofloxacin 250 MG tablet  Commonly known as:  CIPRO  Take 1 tablet (250 mg total) by mouth 2 (two) times daily.     CoQ10 100 MG Caps  Take 1 capsule by mouth daily.     denosumab 60 MG/ML Soln injection  Commonly known as:  PROLIA  Inject 60 mg into the skin every 6 (six) months. Administer in upper arm, thigh, or abdomen.  Patient receives this injection every 6 months.     diphenhydrAMINE 25 mg capsule  Commonly known as:  BENADRYL  Take 50 mg by mouth every 6 (six) hours as needed for allergies.     doxazosin 4 MG tablet  Commonly known as:  CARDURA  Take 4 mg by mouth daily.     finasteride 5 MG tablet  Commonly known as:  PROSCAR  Take 5 mg by mouth every morning.     Glucosamine 500 MG Caps  Take 1,000 mg by mouth daily.     HYDROcodone-acetaminophen 5-325 MG per tablet  Commonly known as:  NORCO  Take 1 tablet by mouth every 6 (six) hours as needed for moderate pain.     multivitamin tablet  Take 1 tablet by mouth daily.     omeprazole 20 MG capsule  Commonly known as:  PRILOSEC  Take 20 mg by mouth 2 (two) times daily before a meal.     Potassium 99 MG Tabs  Take 99 mg by mouth daily.     PROBIOTIC DAILY PO  Take 1 capsule by mouth daily.     rosuvastatin 20 MG tablet  Commonly known as:  CRESTOR  Take 20 mg by mouth daily.           Follow-up Information   Follow up with Carolan Clines I, MD. (as scheduled)    Specialty:  Urology   Contact information:   [place] [address] [phone]     Foley out. Pt has voided.  Plan: Discharge to home.   Signed: Ailene Rud 10/01/2013, 8:53 AM

## 2013-10-03 ENCOUNTER — Emergency Department (HOSPITAL_BASED_OUTPATIENT_CLINIC_OR_DEPARTMENT_OTHER): Payer: BC Managed Care – PPO

## 2013-10-03 ENCOUNTER — Encounter (HOSPITAL_BASED_OUTPATIENT_CLINIC_OR_DEPARTMENT_OTHER): Payer: Self-pay | Admitting: Emergency Medicine

## 2013-10-03 ENCOUNTER — Inpatient Hospital Stay (HOSPITAL_BASED_OUTPATIENT_CLINIC_OR_DEPARTMENT_OTHER)
Admission: EM | Admit: 2013-10-03 | Discharge: 2013-10-05 | DRG: 694 | Disposition: A | Discharge status: Home / Self Care | Payer: BC Managed Care – PPO | Attending: Internal Medicine | Admitting: Internal Medicine

## 2013-10-03 DIAGNOSIS — Z9079 Acquired absence of other genital organ(s): Secondary | ICD-10-CM | POA: Diagnosis not present

## 2013-10-03 DIAGNOSIS — M129 Arthropathy, unspecified: Secondary | ICD-10-CM | POA: Diagnosis present

## 2013-10-03 DIAGNOSIS — E559 Vitamin D deficiency, unspecified: Secondary | ICD-10-CM | POA: Diagnosis present

## 2013-10-03 DIAGNOSIS — N529 Male erectile dysfunction, unspecified: Secondary | ICD-10-CM | POA: Diagnosis present

## 2013-10-03 DIAGNOSIS — Z951 Presence of aortocoronary bypass graft: Secondary | ICD-10-CM | POA: Diagnosis not present

## 2013-10-03 DIAGNOSIS — G4733 Obstructive sleep apnea (adult) (pediatric): Secondary | ICD-10-CM | POA: Diagnosis present

## 2013-10-03 DIAGNOSIS — I10 Essential (primary) hypertension: Secondary | ICD-10-CM | POA: Diagnosis present

## 2013-10-03 DIAGNOSIS — Z87898 Personal history of other specified conditions: Secondary | ICD-10-CM

## 2013-10-03 DIAGNOSIS — I2510 Atherosclerotic heart disease of native coronary artery without angina pectoris: Secondary | ICD-10-CM | POA: Diagnosis present

## 2013-10-03 DIAGNOSIS — Z888 Allergy status to other drugs, medicaments and biological substances status: Secondary | ICD-10-CM

## 2013-10-03 DIAGNOSIS — R1031 Right lower quadrant pain: Secondary | ICD-10-CM | POA: Diagnosis present

## 2013-10-03 DIAGNOSIS — R609 Edema, unspecified: Secondary | ICD-10-CM | POA: Diagnosis present

## 2013-10-03 DIAGNOSIS — N390 Urinary tract infection, site not specified: Secondary | ICD-10-CM | POA: Diagnosis present

## 2013-10-03 DIAGNOSIS — N1330 Unspecified hydronephrosis: Principal | ICD-10-CM

## 2013-10-03 DIAGNOSIS — N400 Enlarged prostate without lower urinary tract symptoms: Secondary | ICD-10-CM

## 2013-10-03 DIAGNOSIS — J45909 Unspecified asthma, uncomplicated: Secondary | ICD-10-CM | POA: Diagnosis present

## 2013-10-03 DIAGNOSIS — M810 Age-related osteoporosis without current pathological fracture: Secondary | ICD-10-CM | POA: Diagnosis present

## 2013-10-03 DIAGNOSIS — Z8249 Family history of ischemic heart disease and other diseases of the circulatory system: Secondary | ICD-10-CM

## 2013-10-03 DIAGNOSIS — N138 Other obstructive and reflux uropathy: Secondary | ICD-10-CM | POA: Diagnosis present

## 2013-10-03 DIAGNOSIS — K219 Gastro-esophageal reflux disease without esophagitis: Secondary | ICD-10-CM | POA: Diagnosis present

## 2013-10-03 DIAGNOSIS — Z823 Family history of stroke: Secondary | ICD-10-CM

## 2013-10-03 DIAGNOSIS — IMO0002 Reserved for concepts with insufficient information to code with codable children: Secondary | ICD-10-CM | POA: Diagnosis present

## 2013-10-03 DIAGNOSIS — E785 Hyperlipidemia, unspecified: Secondary | ICD-10-CM

## 2013-10-03 DIAGNOSIS — M4000 Postural kyphosis, site unspecified: Secondary | ICD-10-CM | POA: Diagnosis present

## 2013-10-03 DIAGNOSIS — Y836 Removal of other organ (partial) (total) as the cause of abnormal reaction of the patient, or of later complication, without mention of misadventure at the time of the procedure: Secondary | ICD-10-CM | POA: Diagnosis present

## 2013-10-03 DIAGNOSIS — Z79899 Other long term (current) drug therapy: Secondary | ICD-10-CM

## 2013-10-03 DIAGNOSIS — N401 Enlarged prostate with lower urinary tract symptoms: Secondary | ICD-10-CM | POA: Diagnosis present

## 2013-10-03 LAB — COMPREHENSIVE METABOLIC PANEL
ALBUMIN: 3.6 g/dL (ref 3.5–5.2)
ALT: 20 U/L (ref 0–53)
AST: 20 U/L (ref 0–37)
Alkaline Phosphatase: 56 U/L (ref 39–117)
BUN: 10 mg/dL (ref 6–23)
CALCIUM: 8.7 mg/dL (ref 8.4–10.5)
CO2: 24 mEq/L (ref 19–32)
CREATININE: 1.1 mg/dL (ref 0.50–1.35)
Chloride: 100 mEq/L (ref 96–112)
GFR calc Af Amer: 74 mL/min — ABNORMAL LOW (ref >90)
GFR calc non Af Amer: 64 mL/min — ABNORMAL LOW (ref >90)
Glucose, Bld: 130 mg/dL — ABNORMAL HIGH (ref 70–99)
Potassium: 4.4 mEq/L (ref 3.7–5.3)
Sodium: 138 mEq/L (ref 137–147)
Total Bilirubin: 0.7 mg/dL (ref 0.3–1.2)
Total Protein: 6.9 g/dL (ref 6.0–8.3)

## 2013-10-03 LAB — CBC WITH DIFFERENTIAL/PLATELET
BASOS ABS: 0 10*3/uL (ref 0.0–0.1)
BASOS PCT: 0 % (ref 0–1)
Eosinophils Absolute: 0.3 10*3/uL (ref 0.0–0.7)
Eosinophils Relative: 3 % (ref 0–5)
HEMATOCRIT: 28.8 % — AB (ref 39.0–52.0)
Hemoglobin: 9.9 g/dL — ABNORMAL LOW (ref 13.0–17.0)
Lymphocytes Relative: 16 % (ref 12–46)
Lymphs Abs: 1.4 10*3/uL (ref 0.7–4.0)
MCH: 30.4 pg (ref 26.0–34.0)
MCHC: 34.4 g/dL (ref 30.0–36.0)
MCV: 88.3 fL (ref 78.0–100.0)
MONO ABS: 0.7 10*3/uL (ref 0.1–1.0)
Monocytes Relative: 8 % (ref 3–12)
Neutro Abs: 6.3 10*3/uL (ref 1.7–7.7)
Neutrophils Relative %: 73 % (ref 43–77)
Platelets: 250 10*3/uL (ref 150–400)
RBC: 3.26 MIL/uL — ABNORMAL LOW (ref 4.22–5.81)
RDW: 12 % (ref 11.5–15.5)
WBC: 8.7 10*3/uL (ref 4.0–10.5)

## 2013-10-03 LAB — URINALYSIS, ROUTINE W REFLEX MICROSCOPIC
Bilirubin Urine: NEGATIVE
GLUCOSE, UA: NEGATIVE mg/dL
KETONES UR: NEGATIVE mg/dL
Nitrite: NEGATIVE
Protein, ur: >300 mg/dL — AB
Specific Gravity, Urine: 1.021 (ref 1.005–1.030)
Urobilinogen, UA: 0.2 mg/dL (ref 0.0–1.0)
pH: 6 (ref 5.0–8.0)

## 2013-10-03 LAB — I-STAT CG4 LACTIC ACID, ED: Lactic Acid, Venous: 1.08 mmol/L (ref 0.5–2.2)

## 2013-10-03 LAB — URINE MICROSCOPIC-ADD ON

## 2013-10-03 LAB — LIPASE, BLOOD: LIPASE: 48 U/L (ref 11–59)

## 2013-10-03 MED ORDER — MORPHINE SULFATE 4 MG/ML IJ SOLN
INTRAMUSCULAR | Status: AC
  Filled 2013-10-03: qty 1

## 2013-10-03 MED ORDER — ONDANSETRON HCL 4 MG/2ML IJ SOLN
4.0000 mg | Freq: Once | INTRAMUSCULAR | Status: AC
  Administered 2013-10-03: 4 mg via INTRAVENOUS
  Filled 2013-10-03: qty 2

## 2013-10-03 MED ORDER — MORPHINE SULFATE 4 MG/ML IJ SOLN
4.0000 mg | Freq: Once | INTRAMUSCULAR | Status: AC
  Administered 2013-10-03: 4 mg via INTRAVENOUS

## 2013-10-03 MED ORDER — CEFTRIAXONE SODIUM 1 G IJ SOLR
INTRAMUSCULAR | Status: AC
  Filled 2013-10-03: qty 10

## 2013-10-03 MED ORDER — DEXTROSE 5 % IV SOLN
1.0000 g | Freq: Once | INTRAVENOUS | Status: AC
  Administered 2013-10-03: 1 g via INTRAVENOUS

## 2013-10-03 MED ORDER — IOHEXOL 300 MG/ML  SOLN
100.0000 mL | Freq: Once | INTRAMUSCULAR | Status: AC | PRN
  Administered 2013-10-03: 100 mL via INTRAVENOUS

## 2013-10-03 MED ORDER — IOHEXOL 300 MG/ML  SOLN
50.0000 mL | Freq: Once | INTRAMUSCULAR | Status: AC | PRN
  Administered 2013-10-03: 50 mL via ORAL

## 2013-10-03 MED ORDER — SODIUM CHLORIDE 0.9 % IV BOLUS (SEPSIS)
1000.0000 mL | Freq: Once | INTRAVENOUS | Status: AC
  Administered 2013-10-03: 1000 mL via INTRAVENOUS

## 2013-10-03 MED ORDER — ONDANSETRON HCL 4 MG/2ML IJ SOLN: 4.0000 mg | Freq: Three times a day (TID) | INTRAMUSCULAR | Status: AC | PRN

## 2013-10-03 MED ORDER — HYDROMORPHONE HCL PF 1 MG/ML IJ SOLN
0.5000 mg | INTRAMUSCULAR | Status: AC | PRN
  Filled 2013-10-03: qty 1

## 2013-10-03 MED ORDER — SODIUM CHLORIDE 0.9 % IV SOLN
INTRAVENOUS | Status: AC
  Administered 2013-10-04: 01:00:00 via INTRAVENOUS

## 2013-10-03 MED ORDER — MORPHINE SULFATE 4 MG/ML IJ SOLN
4.0000 mg | Freq: Once | INTRAMUSCULAR | Status: AC
  Administered 2013-10-03: 4 mg via INTRAVENOUS
  Filled 2013-10-03: qty 1

## 2013-10-03 NOTE — ED Notes (Signed)
Pt reports RLQ pain that started this morning.  Reports nausea, deneis vomiting and diarrhea.  Pt had TURP procedure on 09-24-13

## 2013-10-03 NOTE — ED Provider Notes (Signed)
CSN: [id number]     Arrival date & time 10/03/13  1554 History  This chart was scribed for Ezequiel Essex, MD by Roe Coombs, ED Scribe. The patient was seen in room MH07/MH07. Patient's care was started at 4:20 PM.  Chief Complaint  Patient presents with  . Abdominal Pain    The history is provided by the patient. No language interpreter was used.    HPI Comments: Brent Griffith is a 78 y.o. male who presents to the Emergency Department complaining of episodic, sharp RLQ pain that began at 10 AM. Patient states that he first experienced the pain this morning and that episode lasted for 1 hour. At noon, pain returned and has been constant since then. There is associated nausea. He had a TURP on 09/24/13 with Dr. Carolan Clines. He is still experiencing hematuria and occasional dysuria. Patient finished a course of Cipro about 1 week ago. He saw a PA within Dr. Arlyn Leak practice yesterday and no complications were identified from the procedure. Patient also has recurrent bladder spasms but he says that current pain is different from that discomfort. Patient denies vomiting, diarrhea, or constipation. He denies difficulty voiding urine, urinary frequency, or decreased urine output. Patient is not having testicular pain, chest pain, shortness of breath, or groin pain. His other medical history includes CAD, hyperlipidemia, sleep apnea. Patient has a history of CABG in 2010. He has not taken aspirin for the past 2 weeks.    Past Medical History  Diagnosis Date  . Unspecified essential hypertension   . Esophageal reflux   . Hypersomnia with sleep apnea, unspecified   . Asthma   . Hyperlipidemia   . CAD (coronary artery disease)   . Enlarged prostate   . Kyphosis   . Trigger finger (acquired)   . Anemia, unspecified   . Lumbago   . Senile osteoporosis   . Unspecified vitamin D deficiency   . Elevated prostate specific antigen (PSA)   . Herpes zoster without mention of complication   .  Acute gastric ulcer without mention of hemorrhage, perforation, or obstruction   . Impotence of organic origin   . Allergic rhinitis, cause unspecified   . Extrinsic asthma, unspecified   . Diaphragmatic hernia without mention of obstruction or gangrene   . Blood transfusion without reported diagnosis   . Unspecified sleep apnea     CPAP- settings 4-12   . Obstructive sleep apnea (adult) (pediatric)   . Pneumonia     hx of years ago   . Arthritis    Past Surgical History  Procedure Laterality Date  . Tonsillectomy  1945  . Shoulder surgery Left 04/1998    Dr French Ana  . Mole removal  1958  . Cardiac catheterization  03/04/2009    Dr [name]  . Colonoscopy    . Coronary artery bypass graft  2010  . Transurethral resection of prostate N/A 09/24/2013    Procedure: TRANSURETHRAL RESECTION OF THE PROSTATE (TURP) WITH GYRUS (STAGED RIGHT LATERAL AND MEDIAN LOBE);  Surgeon: Ailene Rud, MD;  Location: [facility];  Service: Urology;  Laterality: N/A;   Family History  Problem Relation Age of Onset  . Stroke Mother   . Hypertension Father   . Stroke Father   . Heart disease Father   . Heart disease Paternal Grandfather   . Colon cancer Neg Hx    History  Substance Use Topics  . Smoking status: Never Smoker   . Smokeless tobacco: Never Used  . Alcohol  Use: No    Review of Systems A complete 10 system review of systems was obtained and all systems are negative except as noted in the HPI and PMH.    Allergies  Compazine  Home Medications   Prior to Admission medications   Medication Sig Start Date End Date Taking? Authorizing Provider  acetaminophen (TYLENOL) 500 MG tablet Take 1,000 mg by mouth every 6 (six) hours as needed for mild pain or moderate pain.    Historical Provider, MD  atenolol (TENORMIN) 50 MG tablet Take 50 mg by mouth every morning.     Historical Provider, MD  Calcium Citrate 250 MG TABS Take 1 tablet by mouth daily.     Historical Provider, MD   cholecalciferol (VITAMIN D) 1000 UNITS tablet Take 1,000 Units by mouth daily.      Historical Provider, MD  ciprofloxacin (CIPRO) 250 MG tablet Take 1 tablet (250 mg total) by mouth 2 (two) times daily. 09/26/13   Reece Packer, MD  Coenzyme Q10 (COQ10) 100 MG CAPS Take 1 capsule by mouth daily.     Historical Provider, MD  denosumab (PROLIA) 60 MG/ML SOLN injection Inject 60 mg into the skin every 6 (six) months. Administer in upper arm, thigh, or abdomen.  Patient receives this injection every 6 months.    Historical Provider, MD  diphenhydrAMINE (BENADRYL) 25 mg capsule Take 50 mg by mouth every 6 (six) hours as needed for allergies.    Historical Provider, MD  doxazosin (CARDURA) 4 MG tablet Take 4 mg by mouth daily.    Historical Provider, MD  finasteride (PROSCAR) 5 MG tablet Take 5 mg by mouth every morning.    Historical Provider, MD  Glucosamine 500 MG CAPS Take 1,000 mg by mouth daily.     Historical Provider, MD  HYDROcodone-acetaminophen (NORCO) 5-325 MG per tablet Take 1 tablet by mouth every 6 (six) hours as needed for moderate pain. 09/26/13   Reece Packer, MD  Multiple Vitamin (MULTIVITAMIN) tablet Take 1 tablet by mouth daily.      Historical Provider, MD  omeprazole (PRILOSEC) 20 MG capsule Take 20 mg by mouth 2 (two) times daily before a meal.    Historical Provider, MD  Potassium 99 MG TABS Take 99 mg by mouth daily.    Historical Provider, MD  Probiotic Product (PROBIOTIC DAILY PO) Take 1 capsule by mouth daily.    Historical Provider, MD  rosuvastatin (CRESTOR) 20 MG tablet Take 20 mg by mouth daily.     Historical Provider, MD   Triage Vitals: BP 152/78  Pulse 72  Temp(Src) 98.4 F (36.9 C) (Oral)  Resp 18  Ht 5\' 5"  (1.651 m)  Wt 150 lb (68.04 kg)  BMI 24.96 kg/m2  SpO2 100% Physical Exam  Nursing note and vitals reviewed. Constitutional: He is oriented to person, place, and time. He appears well-developed and well-nourished. No distress.  HENT:  Head:  Normocephalic and atraumatic.  Mouth/Throat: Oropharynx is clear and moist. No oropharyngeal exudate.  Eyes: Conjunctivae and EOM are normal. Pupils are equal, round, and reactive to light.  Neck: Normal range of motion. Neck supple.  Cardiovascular: Normal rate, regular rhythm and normal heart sounds.   Pulmonary/Chest: Effort normal and breath sounds normal. No respiratory distress. He has no wheezes. He has no rales.  Abdominal: Soft. There is tenderness. There is rebound.  RLQ tenderness with guarding. No CVA tenderness.  Genitourinary:  No testicular tenderness.  Musculoskeletal: Normal range of motion. He exhibits no edema  and no tenderness.  Neurological: He is alert and oriented to person, place, and time. No cranial nerve deficit. He exhibits normal muscle tone. Coordination normal.  Skin: Skin is warm and dry.  Psychiatric: He has a normal mood and affect. His behavior is normal.    ED Course  Procedures (including critical care time) DIAGNOSTIC STUDIES: Oxygen Saturation is 100% on room air, normal by my interpretation.    COORDINATION OF CARE: 4:27 PM- Will give Zofran, morphine and IV fluids. Will order CT and labs. Patient informed of current plan for treatment and evaluation and agrees with plan at this time.   8:25 PM - Spoke with Dr. Junious Silk who is on call for urology. He recommended admission to hospitalist service for possible nephrostomy at [facility].  8:40 PM - Discussed updated treatment plan with patient who verbalizes understanding and agrees. Patient is still having pain; received 2nd dose of morphine about 20 minutes ago.    Labs Review Labs Reviewed  URINALYSIS, ROUTINE W REFLEX MICROSCOPIC - Abnormal; Notable for the following:    Color, Urine AMBER (*)    APPearance TURBID (*)    Hgb urine dipstick LARGE (*)    Protein, ur >300 (*)    Leukocytes, UA MODERATE (*)    All other components within normal limits  CBC WITH DIFFERENTIAL - Abnormal;  Notable for the following:    RBC 3.26 (*)    Hemoglobin 9.9 (*)    HCT 28.8 (*)    All other components within normal limits  COMPREHENSIVE METABOLIC PANEL - Abnormal; Notable for the following:    Glucose, Bld 130 (*)    GFR calc non Af Amer 64 (*)    GFR calc Af Amer 74 (*)    All other components within normal limits  URINE MICROSCOPIC-ADD ON - Abnormal; Notable for the following:    Bacteria, UA FEW (*)    All other components within normal limits  URINE CULTURE  LIPASE, BLOOD  I-STAT CG4 LACTIC ACID, ED    Imaging Review Ct Abdomen Pelvis W Contrast  10/03/2013   CLINICAL DATA:  78 year old male with right lower quadrant pain nausea vomiting diarrhea. Initial encounter. Recent trans urethral prostate resection. Initial encounter.  EXAM: CT ABDOMEN AND PELVIS WITH CONTRAST  TECHNIQUE: Multidetector CT imaging of the abdomen and pelvis was performed using the standard protocol following bolus administration of intravenous contrast.  CONTRAST:  80mL OMNIPAQUE IOHEXOL 300 MG/ML SOLN, 132mL OMNIPAQUE IOHEXOL 300 MG/ML SOLN  COMPARISON:  Chest radiographs 06/03/2013. Lumbar radiographs 10/22/2010.  FINDINGS: No pericardial or pleural effusion. Mild elevation of the left hemidiaphragm. Mild bibasilar atelectasis.  No acute osseous abnormality identified.  No pelvic free fluid. Negative rectum. Minimal sigmoid diverticula. Negative transverse and right colon. Normal appendix. No dilated small bowel. Oral contrast has not yet reached the terminal ileum. Decompressed stomach felt to account for apparent gastric wall thickening.  Cholelithiasis. No pericholecystic fluid. Tiny hypodense areas in the liver probably benign cysts. Negative spleen, pancreas and adrenal glands. Portal venous system within normal limits. Major arterial structures in the abdomen and pelvis are patent. Aortoiliac calcified atherosclerosis noted.  The right kidney appears obstructed with hydronephrosis, perinephric stranding,  and delayed nephrogram and cortical enhancement. There is right hydroureter with mild periureteral stranding. The right ureter remains dilated to the right UVJ. The bladder is thick-walled and irregularly-shaped with a 5 x 4 x 3 cm area of irregular intermediate density and/or soft tissue thickening at the base of the  bladder along its interface with the prostate (series 6, image 58). Prostate parenchyma is indistinct. There is perivesical stranding. There is no hematoma in the space of Retzius.  The left kidney and ureter appear normal. No abdominal free fluid. No urologic calculus identified.  IMPRESSION: 1. Abnormal appearance of the base of the bladder, but may be expected in this recent postoperative state from trans urethral prostate resection (e.g. Postoperative hematoma). 2. Obstructed right kidney and ureter. Point over obstruction seems to be the right ureterovesical junction which is among the areas affected by bladder wall thickening. Cystoscopy may be necessary to evaluate further. 3. Negative left kidney and ureter. No other acute findings in the abdomen or pelvis. 4. Cholelithiasis.   Electronically Signed   By: Lars Pinks M.D.   On: 10/03/2013 19:27     EKG Interpretation None      MDM   Final diagnoses:  Hydronephrosis  Urinary tract infection   Patient with right lower quadrant pain it started around 10 AM. Intermittent associated with nausea. Nothing makes it better or worse. Reports normal bowel movements and urine output. Notably had TURP performed on May 21. Denies any testicular pain or back pain.  Hemoglobin stable. Labs appear to be at baseline. Normal LFTs and lipase. UA appears infected in setting of recent TURP.  patient recently completed a course of Cipro. Rocephin given and culture sent.  CT shows large R hydronephrosis and hydroureter. Likely postoperative hematoma in bladder. Appendix normal.  dw Dr. Junious Silk who reviewed images.  He feels patient may need  nephrostomy tube. He asks hospitalist to admit patient to [facility].  D/w Dr. Ardeth Perfect who [facility] patient to [facility].  NPO at midnight.  BP 131/54  Pulse 74  Temp(Src) 98.3 F (36.8 C) (Oral)  Resp 18  Ht 5\' 5"  (1.651 m)  Wt 152 lb 1.9 oz (69 kg)  BMI 25.31 kg/m2  SpO2 99%   I personally performed the services described in this documentation, which was scribed in my presence. The recorded information has been reviewed and is accurate.  Ezequiel Essex, MD 10/04/13 [phone]

## 2013-10-03 NOTE — ED Notes (Signed)
Bladder scan performed by Lurene Shadow, RN

## 2013-10-04 ENCOUNTER — Encounter (HOSPITAL_COMMUNITY): Payer: BC Managed Care – PPO | Admitting: Anesthesiology

## 2013-10-04 ENCOUNTER — Encounter (HOSPITAL_COMMUNITY): Payer: Self-pay | Admitting: General Practice

## 2013-10-04 ENCOUNTER — Encounter (HOSPITAL_COMMUNITY)
Admission: EM | Disposition: A | Discharge status: Home / Self Care | Payer: Self-pay | Source: Home / Self Care | Attending: Internal Medicine

## 2013-10-04 ENCOUNTER — Inpatient Hospital Stay (HOSPITAL_COMMUNITY): Payer: BC Managed Care – PPO | Admitting: Anesthesiology

## 2013-10-04 DIAGNOSIS — N1330 Unspecified hydronephrosis: Principal | ICD-10-CM

## 2013-10-04 DIAGNOSIS — N400 Enlarged prostate without lower urinary tract symptoms: Secondary | ICD-10-CM

## 2013-10-04 DIAGNOSIS — N390 Urinary tract infection, site not specified: Secondary | ICD-10-CM

## 2013-10-04 DIAGNOSIS — R1031 Right lower quadrant pain: Secondary | ICD-10-CM | POA: Diagnosis not present

## 2013-10-04 HISTORY — PX: CYSTOSCOPY WITH RETROGRADE PYELOGRAM, URETEROSCOPY AND STENT PLACEMENT: SHX5789

## 2013-10-04 LAB — URINE CULTURE: Colony Count: 3000

## 2013-10-04 LAB — SURGICAL PCR SCREEN
MRSA, PCR: NEGATIVE
Staphylococcus aureus: NEGATIVE

## 2013-10-04 SURGERY — CYSTOURETEROSCOPY, WITH RETROGRADE PYELOGRAM AND STENT INSERTION
Anesthesia: General | Site: Ureter | Laterality: Right

## 2013-10-04 MED ORDER — ONDANSETRON HCL 4 MG/2ML IJ SOLN
INTRAMUSCULAR | Status: DC | PRN
  Administered 2013-10-04: 4 mg via INTRAVENOUS

## 2013-10-04 MED ORDER — LACTATED RINGERS IV SOLN: INTRAVENOUS | Status: DC

## 2013-10-04 MED ORDER — FENTANYL CITRATE 0.05 MG/ML IJ SOLN
INTRAMUSCULAR | Status: AC
  Filled 2013-10-04: qty 2

## 2013-10-04 MED ORDER — IOHEXOL 350 MG/ML SOLN
INTRAVENOUS | Status: DC | PRN
  Administered 2013-10-04: 10 mL

## 2013-10-04 MED ORDER — ATENOLOL 50 MG PO TABS
50.0000 mg | ORAL_TABLET | Freq: Every morning | ORAL | Status: DC
  Administered 2013-10-04 – 2013-10-05 (×2): 50 mg via ORAL
  Filled 2013-10-04 (×2): qty 1

## 2013-10-04 MED ORDER — LACTATED RINGERS IV SOLN
INTRAVENOUS | Status: DC | PRN
  Administered 2013-10-04: 15:00:00 via INTRAVENOUS

## 2013-10-04 MED ORDER — PROMETHAZINE HCL 25 MG/ML IJ SOLN: 6.2500 mg | INTRAMUSCULAR | Status: DC | PRN

## 2013-10-04 MED ORDER — POTASSIUM 99 MG PO TABS: 99.0000 mg | ORAL_TABLET | Freq: Every day | ORAL | Status: DC

## 2013-10-04 MED ORDER — DOXAZOSIN MESYLATE 4 MG PO TABS
4.0000 mg | ORAL_TABLET | Freq: Every day | ORAL | Status: DC
  Administered 2013-10-04 – 2013-10-05 (×2): 4 mg via ORAL
  Filled 2013-10-04 (×2): qty 1

## 2013-10-04 MED ORDER — PROPOFOL 10 MG/ML IV BOLUS
INTRAVENOUS | Status: DC | PRN
  Administered 2013-10-04: 150 mg via INTRAVENOUS
  Administered 2013-10-04: 50 mg via INTRAVENOUS

## 2013-10-04 MED ORDER — DEXTROSE 5 % IV SOLN
1.0000 g | INTRAVENOUS | Status: DC
  Administered 2013-10-04: 1 g via INTRAVENOUS
  Filled 2013-10-04 (×2): qty 10

## 2013-10-04 MED ORDER — LIP MEDEX EX OINT
TOPICAL_OINTMENT | CUTANEOUS | Status: DC | PRN
  Filled 2013-10-04: qty 7

## 2013-10-04 MED ORDER — VITAMIN D3 25 MCG (1000 UNIT) PO TABS
1000.0000 [IU] | ORAL_TABLET | Freq: Every day | ORAL | Status: DC
  Administered 2013-10-04 – 2013-10-05 (×2): 1000 [IU] via ORAL
  Filled 2013-10-04 (×2): qty 1

## 2013-10-04 MED ORDER — DEXAMETHASONE SODIUM PHOSPHATE 10 MG/ML IJ SOLN
INTRAMUSCULAR | Status: AC
  Filled 2013-10-04: qty 1

## 2013-10-04 MED ORDER — PHENAZOPYRIDINE HCL 200 MG PO TABS
200.0000 mg | ORAL_TABLET | Freq: Once | ORAL | Status: AC
Start: 2013-10-04 — End: 2013-10-04
  Administered 2013-10-04: 200 mg via ORAL
  Filled 2013-10-04: qty 1

## 2013-10-04 MED ORDER — FENTANYL CITRATE 0.05 MG/ML IJ SOLN
INTRAMUSCULAR | Status: DC | PRN
  Administered 2013-10-04 (×4): 25 ug via INTRAVENOUS

## 2013-10-04 MED ORDER — CIPROFLOXACIN IN D5W 400 MG/200ML IV SOLN
400.0000 mg | Freq: Once | INTRAVENOUS | Status: AC
  Administered 2013-10-04: 400 mg via INTRAVENOUS
  Filled 2013-10-04: qty 200

## 2013-10-04 MED ORDER — DEXAMETHASONE SODIUM PHOSPHATE 10 MG/ML IJ SOLN
INTRAMUSCULAR | Status: DC | PRN
  Administered 2013-10-04: 10 mg via INTRAVENOUS

## 2013-10-04 MED ORDER — FINASTERIDE 5 MG PO TABS
5.0000 mg | ORAL_TABLET | Freq: Every morning | ORAL | Status: DC
  Administered 2013-10-04 – 2013-10-05 (×2): 5 mg via ORAL
  Filled 2013-10-04 (×2): qty 1

## 2013-10-04 MED ORDER — ACETAMINOPHEN 500 MG PO TABS: 1000.0000 mg | ORAL_TABLET | Freq: Four times a day (QID) | ORAL | Status: DC | PRN

## 2013-10-04 MED ORDER — ADULT MULTIVITAMIN W/MINERALS CH
1.0000 | ORAL_TABLET | Freq: Every day | ORAL | Status: DC
  Administered 2013-10-04 – 2013-10-05 (×2): 1 via ORAL
  Filled 2013-10-04 (×2): qty 1

## 2013-10-04 MED ORDER — ATORVASTATIN CALCIUM 40 MG PO TABS
40.0000 mg | ORAL_TABLET | Freq: Every day | ORAL | Status: DC
  Administered 2013-10-04: 40 mg via ORAL
  Filled 2013-10-04 (×2): qty 1

## 2013-10-04 MED ORDER — PROPOFOL 10 MG/ML IV BOLUS
INTRAVENOUS | Status: AC
  Filled 2013-10-04: qty 20

## 2013-10-04 MED ORDER — ONDANSETRON HCL 4 MG/2ML IJ SOLN
INTRAMUSCULAR | Status: AC
  Filled 2013-10-04: qty 2

## 2013-10-04 MED ORDER — SODIUM CHLORIDE 0.9 % IR SOLN
Status: DC | PRN
  Administered 2013-10-04 (×4): 1000 mL

## 2013-10-04 MED ORDER — CIPROFLOXACIN IN D5W 400 MG/200ML IV SOLN
INTRAVENOUS | Status: AC
  Filled 2013-10-04: qty 200

## 2013-10-04 MED ORDER — DIPHENHYDRAMINE HCL 50 MG PO CAPS: 50.0000 mg | ORAL_CAPSULE | Freq: Four times a day (QID) | ORAL | Status: DC | PRN

## 2013-10-04 MED ORDER — PANTOPRAZOLE SODIUM 40 MG PO TBEC
40.0000 mg | DELAYED_RELEASE_TABLET | Freq: Every day | ORAL | Status: DC
  Administered 2013-10-04 – 2013-10-05 (×2): 40 mg via ORAL
  Filled 2013-10-04 (×2): qty 1

## 2013-10-04 MED ORDER — HYDROMORPHONE HCL PF 1 MG/ML IJ SOLN: 0.2500 mg | INTRAMUSCULAR | Status: DC | PRN

## 2013-10-04 MED ORDER — MORPHINE SULFATE 2 MG/ML IJ SOLN
1.0000 mg | INTRAMUSCULAR | Status: DC | PRN
  Administered 2013-10-04: 1 mg via INTRAVENOUS
  Filled 2013-10-04: qty 1

## 2013-10-04 SURGICAL SUPPLY — 25 items
BAG URO CATCHER STRL LF (DRAPE) ×2 IMPLANT
BASKET LASER NITINOL 1.9FR (BASKET) IMPLANT
BASKET STNLS GEMINI 4WIRE 3FR (BASKET) IMPLANT
BASKET ZERO TIP NITINOL 2.4FR (BASKET) IMPLANT
BRUSH URET BIOPSY 3F (UROLOGICAL SUPPLIES) IMPLANT
BSKT STON RTRVL 120 1.9FR (BASKET)
CATH INTERMIT  6FR 70CM (CATHETERS) ×2 IMPLANT
CATH TIEMANN FOLEY 18FR 5CC (CATHETERS) ×2 IMPLANT
CLOTH BEACON ORANGE TIMEOUT ST (SAFETY) ×2 IMPLANT
DRAPE CAMERA CLOSED 9X96 (DRAPES) ×2 IMPLANT
GLOVE BIOGEL M STRL SZ7.5 (GLOVE) ×2 IMPLANT
GOWN STRL REUS W/TWL XL LVL3 (GOWN DISPOSABLE) ×2 IMPLANT
GUIDEWIRE ANG ZIPWIRE 038X150 (WIRE) IMPLANT
GUIDEWIRE STR DUAL SENSOR (WIRE) IMPLANT
IV NS IRRIG 3000ML ARTHROMATIC (IV SOLUTION) ×4 IMPLANT
KIT BALLIN UROMAX 15FX10 (LABEL) IMPLANT
KIT BALLN UROMAX 15FX4 (MISCELLANEOUS) IMPLANT
KIT BALLN UROMAX 26 75X4 (MISCELLANEOUS)
PACK CYSTO (CUSTOM PROCEDURE TRAY) ×2 IMPLANT
PACK CYSTOSCOPY (CUSTOM PROCEDURE TRAY) ×2 IMPLANT
SET HIGH PRES BAL DIL (LABEL)
SHEATH URET ACCESS 12FR/35CM (UROLOGICAL SUPPLIES) IMPLANT
SHEATH URET ACCESS 12FR/55CM (UROLOGICAL SUPPLIES) IMPLANT
STENT CONTOUR 6FRX24X.038 (STENTS) ×2 IMPLANT
SYRINGE IRR TOOMEY STRL 70CC (SYRINGE) IMPLANT

## 2013-10-04 NOTE — Progress Notes (Signed)
Patient back from PACU post cystoscopy stent placement,   alert and oriented denies any pain/distress, lab WNL, foley inplace draining clear orange urine, patient in bed resting, will continue to assess patient.

## 2013-10-04 NOTE — Op Note (Signed)
Preoperative diagnosis: Right hydronephrosis Postoperative diagnosis: Right hydronephrosis  Procedure: Cystoscopy Right retrograde pyelogram Right ureteral stent placement    Surgeon: Junious Silk  Anesthesia: Fortune  Type of anesthesia: Gen.  Findings: On cystoscopy the right ureteral orifice was located just on the edge of the resection. There was a lot of edema and swelling in this area which appeared as if it had kinked off the ureteral orifice which seem to have normal mucosa around it, but was caught in a fold or divot.   Right retrograde pyelogram-this outlined a tortuous proximal ureter with mild hydroureteronephrosis.  Description of procedure: After consent was obtained patient brought to the operating room. After adequate anesthesia he is placed in lithotomy position and prepped and draped in the usual sterile fashion. The patient was given by mouth Pyridium about 1 hour before the procedure. A timeout was performed to confirm the patient and procedure. A cystoscope was passed per urethra and there was an excellent defect from the TURP in the area of the right lateral lobe as evidence by the prior op note as well as the CT scan. The bladder appeared normal, the mucosa appeared normal. The left ureteral orifice was identified and had excellent orange efflux. From the left ureteral orifice the trigone was followed to the expected location of the right ureteral orifice. It was just at the edge of the resection site. A sensor wire was used to probe this area and the wire advanced went a few centimeters lateral then turned and went superior just as the ureter appears on the CT. The wire was advanced without difficulty and coiled in the collecting system. Over the wire 6 [place] open-ended catheter was advanced into the expected location of the proximal ureter and the wire was removed. The catheter began draining orange urine. A retrograde pyelogram was obtained by injecting contrast through the  6 [place] open-ended catheter which outlined a tortuous proximal ureter and a mildly dilated collecting system confirming proper identification of the ureteral orifice and the ureter. The wire was then advanced and under fluoroscopic guidance the kink in the right ureter was straightened out. The wire was coiled in the upper pole calyx. Over the wire 6 x 24 cm stent was advanced. The wire was removed and a good coil was seen in the upper pole calyx and a good coil in the bladder. The scope was removed and an 20 [place] coud catheter was placed to maximally drain the system overnight. He was awakened taken to room in stable condition.  There were no complications. There was no blood loss. There were no specimens.  Drains: 6 x 24 cm right ureteral stent

## 2013-10-04 NOTE — Progress Notes (Signed)
Patient seen and examined, and data base reviewed. Patient seen earlier today by my colleague Dr. Alcario Drought. Presented with abdominal pain, right flank. Hydronephrosis of the right kidney. Had recent TURP on 5/21, questionable UTI, started on Rocephin. Urology for further evaluation, patient elected to have cystoscopy and stents rather than PCN.  Birdie Hopes Pager: [phone] 10/04/2013, 11:06 AM

## 2013-10-04 NOTE — Discharge Instructions (Signed)

## 2013-10-04 NOTE — Progress Notes (Signed)
Patient underwent uneventful right ureteral stent placement. I would anticipate he could go home tomorrow. I left an order for the Foley to be removed at 2 AM. I will notify Dr. Gaynelle Arabian of the patient's admission so that he can follow the patient. I really appreciate excellent hospitalist care.

## 2013-10-04 NOTE — Consult Note (Signed)
Consult: right hydronephrosis Requested by: Dr. Wyvonnia Dusky, Dr. Alcario Drought  History of Present Illness: 78 year old with a long history of BPH, lower urinary tract symptoms and incomplete emptying on maximal medical therapy.  He underwent TURP Sep 24, 2013, staged in which the right lateral lobe and median lobe was resected according to the op note.  He was seen 2 days ago in the office and had a normal postvoid residual.  The urine culture was sent and shows no growth.  I checked it this AM.   He presented yesterday with significant right lower quadrant pain persistent for about 24 hours.  A CT scan of the abdomen and pelvis was obtained which I believe shows good resection of the right lateral lobe and the median lobe, a thickened bladder wall as expected but right hydroureteronephrosis down to the ureterovesical junction with a delayed right nephrogram consistent with some obstruction. I do not see any preoperative upper tract imaging to determine if this hydronephrosis is chronic or new.  His pain is better today, no fever, chills, N/V. Mild dysuria.   Past Medical History  Diagnosis Date  . Unspecified essential hypertension   . Esophageal reflux   . Hypersomnia with sleep apnea, unspecified   . Asthma   . Hyperlipidemia   . CAD (coronary artery disease)   . Enlarged prostate   . Kyphosis   . Trigger finger (acquired)   . Anemia, unspecified   . Lumbago   . Senile osteoporosis   . Unspecified vitamin D deficiency   . Elevated prostate specific antigen (PSA)   . Herpes zoster without mention of complication   . Acute gastric ulcer without mention of hemorrhage, perforation, or obstruction   . Impotence of organic origin   . Allergic rhinitis, cause unspecified   . Extrinsic asthma, unspecified   . Diaphragmatic hernia without mention of obstruction or gangrene   . Blood transfusion without reported diagnosis   . Unspecified sleep apnea     CPAP- settings 4-12   . Obstructive sleep  apnea (adult) (pediatric)   . Pneumonia     hx of years ago   . Arthritis    Past Surgical History  Procedure Laterality Date  . Tonsillectomy  1945  . Shoulder surgery Left 04/1998    Dr French Ana  . Mole removal  1958  . Cardiac catheterization  03/04/2009    Dr [name]  . Colonoscopy    . Coronary artery bypass graft  2010  . Transurethral resection of prostate N/A 09/24/2013    Procedure: TRANSURETHRAL RESECTION OF THE PROSTATE (TURP) WITH GYRUS (STAGED RIGHT LATERAL AND MEDIAN LOBE);  Surgeon: Ailene Rud, MD;  Location: [facility];  Service: Urology;  Laterality: N/A;    Home Medications:  Prescriptions prior to admission  Medication Sig Dispense Refill  . acetaminophen (TYLENOL) 500 MG tablet Take 1,000 mg by mouth every 6 (six) hours as needed for mild pain or moderate pain.      Marland Kitchen atenolol (TENORMIN) 50 MG tablet Take 50 mg by mouth every morning.       . Calcium Citrate 250 MG TABS Take 250 tablets by mouth daily.       . cholecalciferol (VITAMIN D) 1000 UNITS tablet Take 1,000 Units by mouth daily.        Marland Kitchen denosumab (PROLIA) 60 MG/ML SOLN injection Inject 60 mg into the skin every 6 (six) months. Administer in upper arm, thigh, or abdomen.  Patient receives this injection every 6 months.      Marland Kitchen  diphenhydrAMINE (BENADRYL) 25 mg capsule Take 50 mg by mouth every 6 (six) hours as needed for allergies.      Marland Kitchen doxazosin (CARDURA) 4 MG tablet Take 4 mg by mouth daily.      . finasteride (PROSCAR) 5 MG tablet Take 5 mg by mouth every morning.      . Glucosamine 500 MG CAPS Take 1,000 mg by mouth daily.       Marland Kitchen HYDROcodone-acetaminophen (NORCO) 5-325 MG per tablet Take 1 tablet by mouth every 6 (six) hours as needed for moderate pain.  30 tablet  0  . Multiple Vitamin (MULTIVITAMIN) tablet Take 1 tablet by mouth daily.        Marland Kitchen omeprazole (PRILOSEC) 20 MG capsule Take 20 mg by mouth 2 (two) times daily before a meal.      . Potassium 99 MG TABS Take 99 mg by mouth daily.       . Probiotic Product (PROBIOTIC DAILY PO) Take 2 capsules by mouth daily.       . rosuvastatin (CRESTOR) 20 MG tablet Take 20 mg by mouth daily.        Allergies:  Allergies  Allergen Reactions  . Compazine [Prochlorperazine Edisylate] Anxiety    Family History  Problem Relation Age of Onset  . Stroke Mother   . Hypertension Father   . Stroke Father   . Heart disease Father   . Heart disease Paternal Grandfather   . Colon cancer Neg Hx    Social History:  reports that he has never smoked. He has never used smokeless tobacco. He reports that he does not drink alcohol or use illicit drugs.  ROS: A complete review of systems was performed.  All systems are negative except for pertinent findings as noted. Review of Systems  All other systems reviewed and are negative.    Physical Exam:  Vital signs in last 24 hours: Temp:  [98.3 F (36.8 C)-98.4 F (36.9 C)] 98.3 F (36.8 C) (05/31 0521) Pulse Rate:  [67-80] 67 (05/31 0903) Resp:  [18-20] 18 (05/31 0521) BP: (111-152)/(50-78) 116/50 mmHg (05/31 0903) SpO2:  [95 %-100 %] 97 % (05/31 0521) Weight:  [68.04 kg (150 lb)-69 kg (152 lb 1.9 oz)] 69 kg (152 lb 1.9 oz) (05/31 0121) General:  Alert and oriented, No acute distress HEENT: Normocephalic, atraumatic Neck: No JVD or lymphadenopathy Cardiovascular: Regular rate and rhythm Lungs: Regular rate and effort Abdomen: Soft, nontender, nondistended, no abdominal masses Back: No CVA tenderness Extremities: No edema Neurologic: Grossly intact  Laboratory Data:  Results for orders placed during the hospital encounter of 10/03/13 (from the past 24 hour(s))  URINALYSIS, ROUTINE W REFLEX MICROSCOPIC     Status: Abnormal   Collection Time    10/03/13  4:15 PM      Result Value Ref Range   Color, Urine AMBER (*) YELLOW   APPearance TURBID (*) CLEAR   Specific Gravity, Urine 1.021  1.005 - 1.030   pH 6.0  5.0 - 8.0   Glucose, UA NEGATIVE  NEGATIVE mg/dL   Hgb urine dipstick  LARGE (*) NEGATIVE   Bilirubin Urine NEGATIVE  NEGATIVE   Ketones, ur NEGATIVE  NEGATIVE mg/dL   Protein, ur >300 (*) NEGATIVE mg/dL   Urobilinogen, UA 0.2  0.0 - 1.0 mg/dL   Nitrite NEGATIVE  NEGATIVE   Leukocytes, UA MODERATE (*) NEGATIVE  URINE MICROSCOPIC-ADD ON     Status: Abnormal   Collection Time    10/03/13  4:15 PM  Result Value Ref Range   Squamous Epithelial / LPF RARE  RARE   WBC, UA 21-50  <3 WBC/hpf   RBC / HPF TOO NUMEROUS TO COUNT  <3 RBC/hpf   Bacteria, UA FEW (*) RARE  CBC WITH DIFFERENTIAL     Status: Abnormal   Collection Time    10/03/13  4:30 PM      Result Value Ref Range   WBC 8.7  4.0 - 10.5 K/uL   RBC 3.26 (*) 4.22 - 5.81 MIL/uL   Hemoglobin 9.9 (*) 13.0 - 17.0 g/dL   HCT 28.8 (*) 39.0 - 52.0 %   MCV 88.3  78.0 - 100.0 fL   MCH 30.4  26.0 - 34.0 pg   MCHC 34.4  30.0 - 36.0 g/dL   RDW 12.0  11.5 - 15.5 %   Platelets 250  150 - 400 K/uL   Neutrophils Relative % 73  43 - 77 %   Neutro Abs 6.3  1.7 - 7.7 K/uL   Lymphocytes Relative 16  12 - 46 %   Lymphs Abs 1.4  0.7 - 4.0 K/uL   Monocytes Relative 8  3 - 12 %   Monocytes Absolute 0.7  0.1 - 1.0 K/uL   Eosinophils Relative 3  0 - 5 %   Eosinophils Absolute 0.3  0.0 - 0.7 K/uL   Basophils Relative 0  0 - 1 %   Basophils Absolute 0.0  0.0 - 0.1 K/uL  COMPREHENSIVE METABOLIC PANEL     Status: Abnormal   Collection Time    10/03/13  4:30 PM      Result Value Ref Range   Sodium 138  137 - 147 mEq/L   Potassium 4.4  3.7 - 5.3 mEq/L   Chloride 100  96 - 112 mEq/L   CO2 24  19 - 32 mEq/L   Glucose, Bld 130 (*) 70 - 99 mg/dL   BUN 10  6 - 23 mg/dL   Creatinine, Ser 1.10  0.50 - 1.35 mg/dL   Calcium 8.7  8.4 - 10.5 mg/dL   Total Protein 6.9  6.0 - 8.3 g/dL   Albumin 3.6  3.5 - 5.2 g/dL   AST 20  0 - 37 U/L   ALT 20  0 - 53 U/L   Alkaline Phosphatase 56  39 - 117 U/L   Total Bilirubin 0.7  0.3 - 1.2 mg/dL   GFR calc non Af Amer 64 (*) >90 mL/min   GFR calc Af Amer 74 (*) >90 mL/min  LIPASE,  BLOOD     Status: None   Collection Time    10/03/13  4:30 PM      Result Value Ref Range   Lipase 48  11 - 59 U/L  I-STAT CG4 LACTIC ACID, ED     Status: None   Collection Time    10/03/13  4:44 PM      Result Value Ref Range   Lactic Acid, Venous 1.08  0.5 - 2.2 mmol/L   No results found for this or any previous visit (from the past 240 hour(s)). Creatinine:  Recent Labs  10/03/13 1630  CREATININE 1.10   I independently reviewed CT a/p.   Impression/Assessment/plan: 1) right hydroureteronephrosis-I discussed the findings with the patient and drew a picture of the anatomy.  We discussed the nature risk and benefits of continued surveillance, cystoscopy with right retrograde pyelogram and stent placement or right nephrostomy tube with later internalization of the stent.  We discussed  cystoscopy would be less invasive, but would require an anesthetic and we discussed the difficulty of finding the right ureteral orifice in patients with BPH, but even more so postoperatively.  There could be a lot of edema and inflammation around the bladder neck and trigone area.  We discussed nephrostomy tube would be more invasive with potentially higher risk of bleeding but likely to be more successful.  All questions answered. He elects to proceed with cystoscopy, attempted Right RGP, right ureteral stent placement.   Festus Aloe 10/04/2013, 9:24 AM

## 2013-10-04 NOTE — H&P (Addendum)
[facility] History and Physical  Brent Griffith [id number] DOB: 04-03-1936 DOA: 10/03/2013  Referring physician: EDP PCP: Estill Dooms, MD   Chief Complaint: Abdominal pain   HPI: Brent Griffith is a 78 y.o. male s/p TURP on 5/21, he presents to the ED with episodic sharp RLQ, R flank, and R mid back pain that onset at 10 AM.  First occurred this morning and lasted for 1 hour.  Returned again at noon and has been constant since then.  There is associated nausea.  Still having hematuria and occasional dysuria from turp.  Finished course of cipro 1 week ago.  No decreased UOP, difficulty voiding, nor urinary frequency.  Review of Systems: Systems reviewed.  As above, otherwise negative  Past Medical History  Diagnosis Date  . Unspecified essential hypertension   . Esophageal reflux   . Hypersomnia with sleep apnea, unspecified   . Asthma   . Hyperlipidemia   . CAD (coronary artery disease)   . Enlarged prostate   . Kyphosis   . Trigger finger (acquired)   . Anemia, unspecified   . Lumbago   . Senile osteoporosis   . Unspecified vitamin D deficiency   . Elevated prostate specific antigen (PSA)   . Herpes zoster without mention of complication   . Acute gastric ulcer without mention of hemorrhage, perforation, or obstruction   . Impotence of organic origin   . Allergic rhinitis, cause unspecified   . Extrinsic asthma, unspecified   . Diaphragmatic hernia without mention of obstruction or gangrene   . Blood transfusion without reported diagnosis   . Unspecified sleep apnea     CPAP- settings 4-12   . Obstructive sleep apnea (adult) (pediatric)   . Pneumonia     hx of years ago   . Arthritis    Past Surgical History  Procedure Laterality Date  . Tonsillectomy  1945  . Shoulder surgery Left 04/1998    Dr French Ana  . Mole removal  1958  . Cardiac catheterization  03/04/2009    Dr [name]  . Colonoscopy    . Coronary artery bypass graft  2010  .  Transurethral resection of prostate N/A 09/24/2013    Procedure: TRANSURETHRAL RESECTION OF THE PROSTATE (TURP) WITH GYRUS (STAGED RIGHT LATERAL AND MEDIAN LOBE);  Surgeon: Ailene Rud, MD;  Location: [facility];  Service: Urology;  Laterality: N/A;   Social History:  reports that he has never smoked. He has never used smokeless tobacco. He reports that he does not drink alcohol or use illicit drugs.  Allergies  Allergen Reactions  . Compazine [Prochlorperazine Edisylate] Anxiety    Family History  Problem Relation Age of Onset  . Stroke Mother   . Hypertension Father   . Stroke Father   . Heart disease Father   . Heart disease Paternal Grandfather   . Colon cancer Neg Hx      Prior to Admission medications   Medication Sig Start Date End Date Taking? Authorizing Provider  acetaminophen (TYLENOL) 500 MG tablet Take 1,000 mg by mouth every 6 (six) hours as needed for mild pain or moderate pain.    Historical Provider, MD  atenolol (TENORMIN) 50 MG tablet Take 50 mg by mouth every morning.     Historical Provider, MD  Calcium Citrate 250 MG TABS Take 1 tablet by mouth daily.     Historical Provider, MD  cholecalciferol (VITAMIN D) 1000 UNITS tablet Take 1,000 Units by mouth daily.  Historical Provider, MD  denosumab (PROLIA) 60 MG/ML SOLN injection Inject 60 mg into the skin every 6 (six) months. Administer in upper arm, thigh, or abdomen.  Patient receives this injection every 6 months.    Historical Provider, MD  diphenhydrAMINE (BENADRYL) 25 mg capsule Take 50 mg by mouth every 6 (six) hours as needed for allergies.    Historical Provider, MD  doxazosin (CARDURA) 4 MG tablet Take 4 mg by mouth daily.    Historical Provider, MD  finasteride (PROSCAR) 5 MG tablet Take 5 mg by mouth every morning.    Historical Provider, MD  Glucosamine 500 MG CAPS Take 1,000 mg by mouth daily.     Historical Provider, MD  HYDROcodone-acetaminophen (NORCO) 5-325 MG per tablet Take 1 tablet by  mouth every 6 (six) hours as needed for moderate pain. 09/26/13   Reece Packer, MD  Multiple Vitamin (MULTIVITAMIN) tablet Take 1 tablet by mouth daily.      Historical Provider, MD  omeprazole (PRILOSEC) 20 MG capsule Take 20 mg by mouth 2 (two) times daily before a meal.    Historical Provider, MD  Potassium 99 MG TABS Take 99 mg by mouth daily.    Historical Provider, MD  Probiotic Product (PROBIOTIC DAILY PO) Take 1 capsule by mouth daily.    Historical Provider, MD  rosuvastatin (CRESTOR) 20 MG tablet Take 20 mg by mouth daily.     Historical Provider, MD   Physical Exam: Filed Vitals:   10/04/13 0121  BP: 131/54  Pulse: 74  Temp: 98.3 F (36.8 C)  Resp: 18    BP 131/54  Pulse 74  Temp(Src) 98.3 F (36.8 C) (Oral)  Resp 18  Ht 5\' 5"  (1.651 m)  Wt 69 kg (152 lb 1.9 oz)  BMI 25.31 kg/m2  SpO2 99%  General Appearance:    Alert, oriented, no distress, appears stated age  Head:    Normocephalic, atraumatic  Eyes:    PERRL, EOMI, sclera non-icteric        Nose:   Nares without drainage or epistaxis. Mucosa, turbinates normal  Throat:   Moist mucous membranes. Oropharynx without erythema or exudate.  Neck:   Supple. No carotid bruits.  No thyromegaly.  No lymphadenopathy.   Back:     No CVA tenderness, no spinal tenderness  Lungs:     Clear to auscultation bilaterally, without wheezes, rhonchi or rales  Chest wall:    No tenderness to palpitation  Heart:    Regular rate and rhythm without murmurs, gallops, rubs  Abdomen:     Soft, non-tender, nondistended, normal bowel sounds, no organomegaly  Genitalia:    deferred  Rectal:    deferred  Extremities:   No clubbing, cyanosis or edema.  Pulses:   2+ and symmetric all extremities  Skin:   Skin color, texture, turgor normal, no rashes or lesions  Lymph nodes:   Cervical, supraclavicular, and axillary nodes normal  Neurologic:   CNII-XII intact. Normal strength, sensation and reflexes      throughout    Labs on  Admission:  Basic Metabolic Panel:  Recent Labs Lab 10/03/13 1630  NA 138  K 4.4  CL 100  CO2 24  GLUCOSE 130*  BUN 10  CREATININE 1.10  CALCIUM 8.7   Liver Function Tests:  Recent Labs Lab 10/03/13 1630  AST 20  ALT 20  ALKPHOS 56  BILITOT 0.7  PROT 6.9  ALBUMIN 3.6    Recent Labs Lab 10/03/13 1630  LIPASE  48   No results found for this basename: AMMONIA,  in the last 168 hours CBC:  Recent Labs Lab 10/03/13 1630  WBC 8.7  NEUTROABS 6.3  HGB 9.9*  HCT 28.8*  MCV 88.3  PLT 250   Cardiac Enzymes: No results found for this basename: CKTOTAL, CKMB, CKMBINDEX, TROPONINI,  in the last 168 hours  BNP (last 3 results) No results found for this basename: PROBNP,  in the last 8760 hours CBG: No results found for this basename: GLUCAP,  in the last 168 hours  Radiological Exams on Admission: Ct Abdomen Pelvis W Contrast  10/03/2013   CLINICAL DATA:  78 year old male with right lower quadrant pain nausea vomiting diarrhea. Initial encounter. Recent trans urethral prostate resection. Initial encounter.  EXAM: CT ABDOMEN AND PELVIS WITH CONTRAST  TECHNIQUE: Multidetector CT imaging of the abdomen and pelvis was performed using the standard protocol following bolus administration of intravenous contrast.  CONTRAST:  45mL OMNIPAQUE IOHEXOL 300 MG/ML SOLN, 172mL OMNIPAQUE IOHEXOL 300 MG/ML SOLN  COMPARISON:  Chest radiographs 06/03/2013. Lumbar radiographs 10/22/2010.  FINDINGS: No pericardial or pleural effusion. Mild elevation of the left hemidiaphragm. Mild bibasilar atelectasis.  No acute osseous abnormality identified.  No pelvic free fluid. Negative rectum. Minimal sigmoid diverticula. Negative transverse and right colon. Normal appendix. No dilated small bowel. Oral contrast has not yet reached the terminal ileum. Decompressed stomach felt to account for apparent gastric wall thickening.  Cholelithiasis. No pericholecystic fluid. Tiny hypodense areas in the liver  probably benign cysts. Negative spleen, pancreas and adrenal glands. Portal venous system within normal limits. Major arterial structures in the abdomen and pelvis are patent. Aortoiliac calcified atherosclerosis noted.  The right kidney appears obstructed with hydronephrosis, perinephric stranding, and delayed nephrogram and cortical enhancement. There is right hydroureter with mild periureteral stranding. The right ureter remains dilated to the right UVJ. The bladder is thick-walled and irregularly-shaped with a 5 x 4 x 3 cm area of irregular intermediate density and/or soft tissue thickening at the base of the bladder along its interface with the prostate (series 6, image 58). Prostate parenchyma is indistinct. There is perivesical stranding. There is no hematoma in the space of Retzius.  The left kidney and ureter appear normal. No abdominal free fluid. No urologic calculus identified.  IMPRESSION: 1. Abnormal appearance of the base of the bladder, but may be expected in this recent postoperative state from trans urethral prostate resection (e.g. Postoperative hematoma). 2. Obstructed right kidney and ureter. Point over obstruction seems to be the right ureterovesical junction which is among the areas affected by bladder wall thickening. Cystoscopy may be necessary to evaluate further. 3. Negative left kidney and ureter. No other acute findings in the abdomen or pelvis. 4. Cholelithiasis.   Electronically Signed   By: Lars Pinks M.D.   On: 10/03/2013 19:27    EKG: Independently reviewed.  Assessment/Plan Principal Problem:   Hydronephrosis of right kidney Active Problems:   BENIGN PROSTATIC HYPERTROPHY, HX OF   UTI (urinary tract infection)   1. UTI - putting patient on rocephin, no SIRS or evidence of systemic involvement at this point. 2. Hydronephrosis of R kidney - Areas of bladder wall thickening including the area over the R UVJ seen on CT scan.  At this point suspect UTI causing these areas  of inflammation.  Urology to evaluate patient in AM, suspect they will end up performing cystoscopy with stent placement if possible to drain R kidney.  Vs nephrostomy by IR.  Patient being  kept NPO in anticipation of procedure. 3. H/o BPH - had TURP 10 days ago.  Dr. Junious Silk was consulted by EDP and he requested the patient be admitted to [facility] so he could evaluate in AM.  Code Status: Full Code  Family Communication: Wife at bedside Disposition Plan: Admit to inpatient   Time spent: 3 min  [facility] Pager [phone]  If 7AM-7PM, please contact the day team taking care of the patient [email or web address] Password [email or web address] 10/04/2013, 2:10 AM

## 2013-10-04 NOTE — Anesthesia Preprocedure Evaluation (Addendum)
Anesthesia Evaluation  Patient identified by MRN, date of birth, ID band Patient awake    Reviewed: Allergy & Precautions, H&P , NPO status , Patient's Chart, lab work & pertinent test results  Airway Mallampati: II TM Distance: <3 FB Neck ROM: Full    Dental no notable dental hx. (+) Teeth Intact, Dental Advisory Given   Pulmonary sleep apnea and Continuous Positive Airway Pressure Ventilation ,  breath sounds clear to auscultation  Pulmonary exam normal       Cardiovascular hypertension, Pt. on medications and Pt. on home beta blockers + CAD and + CABG Rhythm:Regular Rate:Normal  Overall Impression:  Low risk stress nuclear study with large, severe, partially reversible anterior, lateral and inferior defects consistent with prior infarct and mild peri-infarct ischemia.   LV Ejection Fraction: 50%.  LV Wall Motion:  Hypokinesis of the anterolateral wall.      Neuro/Psych negative neurological ROS  negative psych ROS   GI/Hepatic Neg liver ROS, GERD-  Medicated,  Endo/Other  negative endocrine ROS  Renal/GU negative Renal ROS  negative genitourinary   Musculoskeletal negative musculoskeletal ROS (+)   Abdominal   Peds negative pediatric ROS (+)  Hematology negative hematology ROS (+)   Anesthesia Other Findings   Reproductive/Obstetrics negative OB ROS                          Anesthesia Physical Anesthesia Plan  ASA: III and emergent  Anesthesia Plan: General   Post-op Pain Management:    Induction: Intravenous  Airway Management Planned: LMA  Additional Equipment:   Intra-op Plan:   Post-operative Plan: Extubation in OR  Informed Consent: I have reviewed the patients History and Physical, chart, labs and discussed the procedure including the risks, benefits and alternatives for the proposed anesthesia with the patient or authorized representative who has indicated his/her  understanding and acceptance.   Dental advisory given  Plan Discussed with: CRNA  Anesthesia Plan Comments:        Anesthesia Quick Evaluation

## 2013-10-04 NOTE — Transfer of Care (Signed)
Immediate Anesthesia Transfer of Care Note  Patient: Brent Griffith  Procedure(s) Performed: Procedure(s): CYSTOSCOPY WITH RETROGRADE PYELOGRAM,  AND STENT PLACEMENT (Right)  Patient Location: PACU  Anesthesia Type:General  Level of Consciousness: sedated and patient cooperative  Airway & Oxygen Therapy: Patient Spontanous Breathing and Patient connected to face mask oxygen  Post-op Assessment: Report given to PACU RN and Post -op Vital signs reviewed and stable  Post vital signs: Reviewed and stable  Complications: No apparent anesthesia complications

## 2013-10-05 ENCOUNTER — Encounter (HOSPITAL_COMMUNITY): Payer: Self-pay | Admitting: Urology

## 2013-10-05 DIAGNOSIS — E785 Hyperlipidemia, unspecified: Secondary | ICD-10-CM

## 2013-10-05 DIAGNOSIS — Z87898 Personal history of other specified conditions: Secondary | ICD-10-CM

## 2013-10-05 DIAGNOSIS — I10 Essential (primary) hypertension: Secondary | ICD-10-CM

## 2013-10-05 LAB — BASIC METABOLIC PANEL
BUN: 13 mg/dL (ref 6–23)
CO2: 24 mEq/L (ref 19–32)
Calcium: 8 mg/dL — ABNORMAL LOW (ref 8.4–10.5)
Chloride: 104 mEq/L (ref 96–112)
Creatinine, Ser: 0.99 mg/dL (ref 0.50–1.35)
GFR calc non Af Amer: 78 mL/min — ABNORMAL LOW (ref >90)
Glucose, Bld: 150 mg/dL — ABNORMAL HIGH (ref 70–99)
POTASSIUM: 4.2 meq/L (ref 3.7–5.3)
Sodium: 140 mEq/L (ref 137–147)

## 2013-10-05 LAB — CBC
HCT: 29.9 % — ABNORMAL LOW (ref 39.0–52.0)
Hemoglobin: 9.7 g/dL — ABNORMAL LOW (ref 13.0–17.0)
MCH: 28 pg (ref 26.0–34.0)
MCHC: 32.4 g/dL (ref 30.0–36.0)
MCV: 86.4 fL (ref 78.0–100.0)
Platelets: 246 10*3/uL (ref 150–400)
RBC: 3.46 MIL/uL — AB (ref 4.22–5.81)
RDW: 12.3 % (ref 11.5–15.5)
WBC: 10.3 10*3/uL (ref 4.0–10.5)

## 2013-10-05 MED ORDER — CIPROFLOXACIN HCL 500 MG PO TABS: 500.0000 mg | ORAL_TABLET | Freq: Two times a day (BID) | ORAL | Status: DC

## 2013-10-05 NOTE — Progress Notes (Signed)
Talk to Dr. Junious Silk about patient discharge and he stated patient can be discharged and F/U with Dr. Gaynelle Arabian outpatient.

## 2013-10-05 NOTE — Discharge Summary (Signed)
Physician Discharge Summary  Brent Griffith [id number] DOB: 1935/06/06 DOA: 10/03/2013  PCP: Estill Dooms, MD  Admit date: 10/03/2013 Discharge date: 10/05/2013  Time spent: 40 minutes  Recommendations for Outpatient Follow-up:  1. Followup with Dr. Gaynelle Arabian, call for appointment, stent need to be removed at some point.  Discharge Diagnoses:  Principal Problem:   Hydronephrosis of right kidney Active Problems:   BENIGN PROSTATIC HYPERTROPHY, HX OF   UTI (urinary tract infection)   Discharge Condition: Stable  Diet recommendation: Regular  Filed Weights   10/03/13 1611 10/04/13 0121  Weight: 68.04 kg (150 lb) 69 kg (152 lb 1.9 oz)    History of present illness:  Brent Griffith is a 78 y.o. male s/p TURP on 5/21, he presents to the ED with episodic sharp RLQ, R flank, and R mid back pain that onset at 10 AM. First occurred this morning and lasted for 1 hour. Returned again at noon and has been constant since then. There is associated nausea. Still having hematuria and occasional dysuria from turp. Finished course of cipro 1 week ago. No decreased UOP, difficulty voiding, nor urinary frequency.   Hospital Course:   Hydronephrosis of the right kidney -Urology consulted for further evaluation. -Patient chose to have UTI we'll stent instead of percutaneous nephrostomy. -Right-sided arterial stent placed by Dr. Junious Silk on 10/04/2013. -Patient needs followup with Dr. Gaynelle Arabian.  Questionable UTI -Patient has some pyuria but noted that he had recent urological procedure. -Patient was placed on Rocephin and at the time of admission, discharge on 3 days of Cipro, no evidence of pyelonephritis.  History of BPH -Had TURP on 09/24/2013 with Dr. Gaynelle Arabian, patient to followup with him.  Procedures:  Placement of right ureteral stent on 10/04/2013, done by Dr. Junious Silk.  Consultations:  Urology  Discharge Exam: Filed Vitals:   10/05/13 0934  BP: 112/64  Pulse: 79   Temp:   Resp:    General: Alert and awake, oriented x3, not in any acute distress. HEENT: anicteric sclera, pupils reactive to light and accommodation, EOMI CVS: S1-S2 clear, no murmur rubs or gallops Chest: clear to auscultation bilaterally, no wheezing, rales or rhonchi Abdomen: soft nontender, nondistended, normal bowel sounds, no organomegaly Extremities: no cyanosis, clubbing or edema noted bilaterally Neuro: Cranial nerves II-XII intact, no focal neurological deficits  Discharge Instructions You were cared for by a hospitalist during your hospital stay. If you have any questions about your discharge medications or the care you received while you were in the hospital after you are discharged, you can call the unit and asked to speak with the hospitalist on call if the hospitalist that took care of you is not available. Once you are discharged, your primary care physician will handle any further medical issues. Please note that NO REFILLS for any discharge medications will be authorized once you are discharged, as it is imperative that you return to your primary care physician (or establish a relationship with a primary care physician if you do not have one) for your aftercare needs so that they can reassess your need for medications and monitor your lab values.  Discharge Instructions   Diet - low sodium heart healthy    Complete by:  As directed      Increase activity slowly    Complete by:  As directed             Medication List         acetaminophen 500 MG tablet  Commonly known as:  TYLENOL  Take 1,000 mg by mouth every 6 (six) hours as needed for mild pain or moderate pain.     atenolol 50 MG tablet  Commonly known as:  TENORMIN  Take 50 mg by mouth every morning.     Calcium Citrate 250 MG Tabs  Take 250 tablets by mouth daily.     cholecalciferol 1000 UNITS tablet  Commonly known as:  VITAMIN D  Take 1,000 Units by mouth daily.     ciprofloxacin 500 MG tablet   Commonly known as:  CIPRO  Take 1 tablet (500 mg total) by mouth 2 (two) times daily.     denosumab 60 MG/ML Soln injection  Commonly known as:  PROLIA  Inject 60 mg into the skin every 6 (six) months. Administer in upper arm, thigh, or abdomen.  Patient receives this injection every 6 months.     diphenhydrAMINE 25 mg capsule  Commonly known as:  BENADRYL  Take 50 mg by mouth every 6 (six) hours as needed for allergies.     doxazosin 4 MG tablet  Commonly known as:  CARDURA  Take 4 mg by mouth daily.     finasteride 5 MG tablet  Commonly known as:  PROSCAR  Take 5 mg by mouth every morning.     Glucosamine 500 MG Caps  Take 1,000 mg by mouth daily.     HYDROcodone-acetaminophen 5-325 MG per tablet  Commonly known as:  NORCO  Take 1 tablet by mouth every 6 (six) hours as needed for moderate pain.     multivitamin tablet  Take 1 tablet by mouth daily.     omeprazole 20 MG capsule  Commonly known as:  PRILOSEC  Take 20 mg by mouth 2 (two) times daily before a meal.     Potassium 99 MG Tabs  Take 99 mg by mouth daily.     PROBIOTIC DAILY PO  Take 2 capsules by mouth daily.     rosuvastatin 20 MG tablet  Commonly known as:  CRESTOR  Take 20 mg by mouth daily.       Allergies  Allergen Reactions  . Compazine [Prochlorperazine Edisylate] Anxiety       Follow-up Information   Follow up with Carolan Clines I, MD. (Call for appointment, the ureteral stent will need to be removed)    Specialty:  Urology   Contact information:   [place] [address] [phone]        The results of significant diagnostics from this hospitalization (including imaging, microbiology, ancillary and laboratory) are listed below for reference.    Significant Diagnostic Studies: Ct Abdomen Pelvis W Contrast  10/03/2013   CLINICAL DATA:  78 year old male with right lower quadrant pain nausea vomiting diarrhea. Initial encounter. Recent trans urethral prostate  resection. Initial encounter.  EXAM: CT ABDOMEN AND PELVIS WITH CONTRAST  TECHNIQUE: Multidetector CT imaging of the abdomen and pelvis was performed using the standard protocol following bolus administration of intravenous contrast.  CONTRAST:  70mL OMNIPAQUE IOHEXOL 300 MG/ML SOLN, 127mL OMNIPAQUE IOHEXOL 300 MG/ML SOLN  COMPARISON:  Chest radiographs 06/03/2013. Lumbar radiographs 10/22/2010.  FINDINGS: No pericardial or pleural effusion. Mild elevation of the left hemidiaphragm. Mild bibasilar atelectasis.  No acute osseous abnormality identified.  No pelvic free fluid. Negative rectum. Minimal sigmoid diverticula. Negative transverse and right colon. Normal appendix. No dilated small bowel. Oral contrast has not yet reached the terminal ileum. Decompressed stomach felt to account for apparent gastric wall thickening.  Cholelithiasis.  No pericholecystic fluid. Tiny hypodense areas in the liver probably benign cysts. Negative spleen, pancreas and adrenal glands. Portal venous system within normal limits. Major arterial structures in the abdomen and pelvis are patent. Aortoiliac calcified atherosclerosis noted.  The right kidney appears obstructed with hydronephrosis, perinephric stranding, and delayed nephrogram and cortical enhancement. There is right hydroureter with mild periureteral stranding. The right ureter remains dilated to the right UVJ. The bladder is thick-walled and irregularly-shaped with a 5 x 4 x 3 cm area of irregular intermediate density and/or soft tissue thickening at the base of the bladder along its interface with the prostate (series 6, image 58). Prostate parenchyma is indistinct. There is perivesical stranding. There is no hematoma in the space of Retzius.  The left kidney and ureter appear normal. No abdominal free fluid. No urologic calculus identified.  IMPRESSION: 1. Abnormal appearance of the base of the bladder, but may be expected in this recent postoperative state from trans  urethral prostate resection (e.g. Postoperative hematoma). 2. Obstructed right kidney and ureter. Point over obstruction seems to be the right ureterovesical junction which is among the areas affected by bladder wall thickening. Cystoscopy may be necessary to evaluate further. 3. Negative left kidney and ureter. No other acute findings in the abdomen or pelvis. 4. Cholelithiasis.   Electronically Signed   By: Lars Pinks M.D.   On: 10/03/2013 19:27    Microbiology: Recent Results (from the past 240 hour(s))  URINE CULTURE     Status: None   Collection Time    10/03/13  5:50 PM      Result Value Ref Range Status   Specimen Description URINE, CLEAN CATCH   Final   Special Requests NONE   Final   Culture  Setup Time     Final   Value: 10/03/2013 23:03     Performed at [place]     Final   Value: 3,000 COLONIES/ML     Performed at [facility]   Culture     Final   Value: INSIGNIFICANT GROWTH     Performed at [facility]   Report Status 10/04/2013 FINAL   Final  SURGICAL PCR SCREEN     Status: None   Collection Time    10/04/13 11:36 AM      Result Value Ref Range Status   MRSA, PCR NEGATIVE  NEGATIVE Final   Staphylococcus aureus NEGATIVE  NEGATIVE Final   Comment:            The Xpert SA Assay (FDA     approved for NASAL specimens     in patients over 17 years of age),     is one component of     a comprehensive surveillance     program.  Test performance has     been validated by [employer] for patients greater     than or equal to 64 year old.     It is not intended     to diagnose infection nor to     guide or monitor treatment.     Labs: Basic Metabolic Panel:  Recent Labs Lab 10/03/13 1630 10/05/13 0457  NA 138 140  K 4.4 4.2  CL 100 104  CO2 24 24  GLUCOSE 130* 150*  BUN 10 13  CREATININE 1.10 0.99  CALCIUM 8.7 8.0*   Liver Function Tests:  Recent Labs Lab 10/03/13 1630  AST 20  ALT  20  ALKPHOS 56   BILITOT 0.7  PROT 6.9  ALBUMIN 3.6    Recent Labs Lab 10/03/13 1630  LIPASE 48   No results found for this basename: AMMONIA,  in the last 168 hours CBC:  Recent Labs Lab 10/03/13 1630 10/05/13 0457  WBC 8.7 10.3  NEUTROABS 6.3  --   HGB 9.9* 9.7*  HCT 28.8* 29.9*  MCV 88.3 86.4  PLT 250 246   Cardiac Enzymes: No results found for this basename: CKTOTAL, CKMB, CKMBINDEX, TROPONINI,  in the last 168 hours BNP: BNP (last 3 results) No results found for this basename: PROBNP,  in the last 8760 hours CBG: No results found for this basename: GLUCAP,  in the last 168 hours     Signed:  Verlee Monte  [facility] 10/05/2013, 9:44 AM

## 2013-10-05 NOTE — Progress Notes (Signed)
Patient discharged home with wife, discharge instructions given and explained to patient/wife and they verbalized understanding, denies any pain/distress. No wound noted skin intact. Patient voiding without any distress. Accompanied home by wife, transported to the car by staff.

## 2013-10-07 NOTE — Anesthesia Postprocedure Evaluation (Signed)
Anesthesia Post Note  Patient: Brent Griffith  Procedure(s) Performed: Procedure(s) (LRB): CYSTOSCOPY WITH RETROGRADE PYELOGRAM,  AND STENT PLACEMENT (Right)  Anesthesia type: General  Patient location: PACU  Post pain: Pain level controlled  Post assessment: Post-op Vital signs reviewed  Last Vitals:  Filed Vitals:   10/05/13 0934  BP: 112/64  Pulse: 79  Temp:   Resp:     Post vital signs: Reviewed  Level of consciousness: sedated  Complications: No apparent anesthesia complications

## 2013-10-10 ENCOUNTER — Other Ambulatory Visit: Payer: Self-pay | Admitting: Internal Medicine

## 2013-10-14 ENCOUNTER — Encounter: Payer: Self-pay | Admitting: Internal Medicine

## 2013-10-14 ENCOUNTER — Ambulatory Visit (INDEPENDENT_AMBULATORY_CARE_PROVIDER_SITE_OTHER): Payer: BC Managed Care – PPO | Admitting: Internal Medicine

## 2013-10-14 VITALS — BP 136/80 | HR 73 | Temp 97.5°F | Resp 20 | Ht 65.0 in | Wt 154.4 lb

## 2013-10-14 DIAGNOSIS — N1330 Unspecified hydronephrosis: Secondary | ICD-10-CM

## 2013-10-14 DIAGNOSIS — N390 Urinary tract infection, site not specified: Secondary | ICD-10-CM

## 2013-10-14 DIAGNOSIS — I10 Essential (primary) hypertension: Secondary | ICD-10-CM

## 2013-10-14 DIAGNOSIS — N400 Enlarged prostate without lower urinary tract symptoms: Secondary | ICD-10-CM

## 2013-10-14 NOTE — Progress Notes (Signed)
Patient ID: Brent Griffith, male   DOB: 05/05/1936, 78 y.o.   MRN: [id number]    Location:    [facility]  Place of Service:  OFFICE    Allergies  Allergen Reactions  . Compazine [Prochlorperazine Edisylate] Anxiety    Chief Complaint  Patient presents with  . Hospitalization Follow-up    HPI:  Two hospitalizations recently. Initially from 09/24/13 to 10/01/13  for TURP by Dr. Gaynelle Arabian for BPH.  Rehospitalized 10/04/13 to 10/05/13 for UTI and right hydronephrosis. Had right ureteral stent placement that is to be removed in about 2 more weeks.  He is doing much better.  UTI (urinary tract infection): no fever or dysuria.  HYPERTENSION: controlled     Medications: Patient's Medications  New Prescriptions   No medications on file  Previous Medications   ACETAMINOPHEN (TYLENOL) 500 MG TABLET    Take 1,000 mg by mouth every 6 (six) hours as needed for mild pain or moderate pain.   ATENOLOL (TENORMIN) 50 MG TABLET    Take 50 mg by mouth every morning.    CALCIUM CITRATE 250 MG TABS    Take 250 tablets by mouth daily.    CHOLECALCIFEROL (VITAMIN D) 1000 UNITS TABLET    Take 1,000 Units by mouth daily.     CRESTOR 20 MG TABLET    TAKE 1 TABLET BY MOUTH EVERY DAY   DENOSUMAB (PROLIA) 60 MG/ML SOLN INJECTION    Inject 60 mg into the skin every 6 (six) months. Administer in upper arm, thigh, or abdomen.  Patient receives this injection every 6 months.   DIPHENHYDRAMINE (BENADRYL) 25 MG CAPSULE    Take 50 mg by mouth every 6 (six) hours as needed for allergies.   DOXAZOSIN (CARDURA) 4 MG TABLET    Take 4 mg by mouth daily.   FINASTERIDE (PROSCAR) 5 MG TABLET    Take 5 mg by mouth every morning.   GLUCOSAMINE 500 MG CAPS    Take 1,000 mg by mouth daily.    HYDROCODONE-ACETAMINOPHEN (NORCO) 5-325 MG PER TABLET    Take 1 tablet by mouth every 6 (six) hours as needed for moderate pain.   MULTIPLE VITAMIN (MULTIVITAMIN) TABLET    Take 1 tablet by mouth daily.     OMEPRAZOLE (PRILOSEC) 20 MG  CAPSULE    Take 20 mg by mouth 2 (two) times daily before a meal.   POTASSIUM 99 MG TABS    Take 99 mg by mouth daily.   PROBIOTIC PRODUCT (PROBIOTIC DAILY PO)    Take 2 capsules by mouth daily.   Modified Medications   No medications on file  Discontinued Medications   CIPROFLOXACIN (CIPRO) 500 MG TABLET    Take 1 tablet (500 mg total) by mouth 2 (two) times daily.     Review of Systems  Constitutional: Negative.   HENT: Negative.   Eyes: Negative.   Respiratory: Negative.        Wears CPAP. Followed by Dr. Annamaria Boots.  Cardiovascular: Negative for chest pain, palpitations and leg swelling.  Gastrointestinal: Negative.   Endocrine: Negative.   Musculoskeletal: Negative.   Skin: Negative.   Allergic/Immunologic: Negative.   Neurological: Negative.   Hematological: Negative.   Psychiatric/Behavioral: Negative.     Filed Vitals:   10/14/13 1530  BP: 136/80  Pulse: 73  Temp: 97.5 F (36.4 C)  TempSrc: Oral  Resp: 20  Height: 5' 5"  (1.651 m)  Weight: 154 lb 6.4 oz (70.035 kg)  SpO2: 99%   Body mass  index is 25.69 kg/(m^2).  Physical Exam  Constitutional: He is oriented to person, place, and time. He appears well-developed and well-nourished. No distress.  HENT:  Head: Atraumatic.  Right Ear: External ear normal.  Left Ear: External ear normal.  Nose: Nose normal.  Eyes: Conjunctivae and EOM are normal. Pupils are equal, round, and reactive to light.  Neck: Normal range of motion. Neck supple. No JVD present. No tracheal deviation present. No thyromegaly present.  Cardiovascular: Normal rate, regular rhythm, normal heart sounds and intact distal pulses.  Exam reveals no gallop and no friction rub.   No murmur heard. Pulmonary/Chest: Effort normal and breath sounds normal. No respiratory distress. He has no wheezes. He has no rales. He exhibits no tenderness.  Abdominal: Soft. Bowel sounds are normal. He exhibits no distension and no mass. There is no tenderness. There is no  rebound.  Musculoskeletal: Normal range of motion. He exhibits no edema and no tenderness.  Lymphadenopathy:    He has no cervical adenopathy.  Neurological: He is alert and oriented to person, place, and time. He has normal reflexes. No cranial nerve deficit. Coordination normal.  Skin: Skin is warm and dry. No rash noted. No erythema. No pallor.  Psychiatric: He has a normal mood and affect. His behavior is normal. Judgment and thought content normal.     Labs reviewed: Admission on 10/03/2013, Discharged on 10/05/2013  Component Date Value Ref Range Status  . Color, Urine 10/03/2013 AMBER* YELLOW Final   BIOCHEMICALS MAY BE AFFECTED BY COLOR  . APPearance 10/03/2013 TURBID* CLEAR Final  . Specific Gravity, Urine 10/03/2013 1.021  1.005 - 1.030 Final  . pH 10/03/2013 6.0  5.0 - 8.0 Final  . Glucose, UA 10/03/2013 NEGATIVE  NEGATIVE mg/dL Final  . Hgb urine dipstick 10/03/2013 LARGE* NEGATIVE Final  . Bilirubin Urine 10/03/2013 NEGATIVE  NEGATIVE Final  . Ketones, ur 10/03/2013 NEGATIVE  NEGATIVE mg/dL Final  . Protein, ur 10/03/2013 >300* NEGATIVE mg/dL Final  . Urobilinogen, UA 10/03/2013 0.2  0.0 - 1.0 mg/dL Final  . Nitrite 10/03/2013 NEGATIVE  NEGATIVE Final  . Leukocytes, UA 10/03/2013 MODERATE* NEGATIVE Final  . WBC 10/03/2013 8.7  4.0 - 10.5 K/uL Final  . RBC 10/03/2013 3.26* 4.22 - 5.81 MIL/uL Final  . Hemoglobin 10/03/2013 9.9* 13.0 - 17.0 g/dL Final  . HCT 10/03/2013 28.8* 39.0 - 52.0 % Final  . MCV 10/03/2013 88.3  78.0 - 100.0 fL Final  . MCH 10/03/2013 30.4  26.0 - 34.0 pg Final  . MCHC 10/03/2013 34.4  30.0 - 36.0 g/dL Final  . RDW 10/03/2013 12.0  11.5 - 15.5 % Final  . Platelets 10/03/2013 250  150 - 400 K/uL Final  . Neutrophils Relative % 10/03/2013 73  43 - 77 % Final  . Neutro Abs 10/03/2013 6.3  1.7 - 7.7 K/uL Final  . Lymphocytes Relative 10/03/2013 16  12 - 46 % Final  . Lymphs Abs 10/03/2013 1.4  0.7 - 4.0 K/uL Final  . Monocytes Relative 10/03/2013 8   3 - 12 % Final  . Monocytes Absolute 10/03/2013 0.7  0.1 - 1.0 K/uL Final  . Eosinophils Relative 10/03/2013 3  0 - 5 % Final  . Eosinophils Absolute 10/03/2013 0.3  0.0 - 0.7 K/uL Final  . Basophils Relative 10/03/2013 0  0 - 1 % Final  . Basophils Absolute 10/03/2013 0.0  0.0 - 0.1 K/uL Final  . Sodium 10/03/2013 138  137 - 147 mEq/L Final  . Potassium 10/03/2013 4.4  3.7 - 5.3 mEq/L Final  . Chloride 10/03/2013 100  96 - 112 mEq/L Final  . CO2 10/03/2013 24  19 - 32 mEq/L Final  . Glucose, Bld 10/03/2013 130* 70 - 99 mg/dL Final  . BUN 10/03/2013 10  6 - 23 mg/dL Final  . Creatinine, Ser 10/03/2013 1.10  0.50 - 1.35 mg/dL Final  . Calcium 10/03/2013 8.7  8.4 - 10.5 mg/dL Final  . Total Protein 10/03/2013 6.9  6.0 - 8.3 g/dL Final  . Albumin 10/03/2013 3.6  3.5 - 5.2 g/dL Final  . AST 10/03/2013 20  0 - 37 U/L Final  . ALT 10/03/2013 20  0 - 53 U/L Final  . Alkaline Phosphatase 10/03/2013 56  39 - 117 U/L Final  . Total Bilirubin 10/03/2013 0.7  0.3 - 1.2 mg/dL Final  . GFR calc non Af Amer 10/03/2013 64* >90 mL/min Final  . GFR calc Af Amer 10/03/2013 74* >90 mL/min Final   Comment: (NOTE)                          The eGFR has been calculated using the CKD EPI equation.                          This calculation has not been validated in all clinical situations.                          eGFR's persistently <90 mL/min signify possible Chronic Kidney                          Disease.  . Lipase 10/03/2013 48  11 - 59 U/L Final  . Lactic Acid, Venous 10/03/2013 1.08  0.5 - 2.2 mmol/L Final  . Squamous Epithelial / LPF 10/03/2013 RARE  RARE Final  . WBC, UA 10/03/2013 21-50  <3 WBC/hpf Final  . RBC / HPF 10/03/2013 TOO NUMEROUS TO COUNT  <3 RBC/hpf Final  . Bacteria, UA 10/03/2013 FEW* RARE Final  . Specimen Description 10/03/2013 URINE, CLEAN CATCH   Final  . Special Requests 10/03/2013 NONE   Final  . Culture  Setup Time 10/03/2013    Final                   Value:10/03/2013 23:03                           Performed at [facility]  . Colony Count 10/03/2013    Final                   Value:3,000 COLONIES/ML                         Performed at [facility]  . Culture 10/03/2013    Final                   Value:INSIGNIFICANT GROWTH                         Performed at [facility]  . Report Status 10/03/2013 10/04/2013 FINAL   Final  . MRSA, PCR 10/04/2013 NEGATIVE  NEGATIVE Final  . Staphylococcus aureus 10/04/2013 NEGATIVE  NEGATIVE Final   Comment:  The Xpert SA Assay (FDA                          approved for NASAL specimens                          in patients over 76 years of age),                          is one component of                          a comprehensive surveillance                          program.  Test performance has                          been validated by [employer] for patients greater                          than or equal to 81 year old.                          It is not intended                          to diagnose infection nor to                          guide or monitor treatment.  . Sodium 10/05/2013 140  137 - 147 mEq/L Final  . Potassium 10/05/2013 4.2  3.7 - 5.3 mEq/L Final  . Chloride 10/05/2013 104  96 - 112 mEq/L Final  . CO2 10/05/2013 24  19 - 32 mEq/L Final  . Glucose, Bld 10/05/2013 150* 70 - 99 mg/dL Final  . BUN 10/05/2013 13  6 - 23 mg/dL Final  . Creatinine, Ser 10/05/2013 0.99  0.50 - 1.35 mg/dL Final  . Calcium 10/05/2013 8.0* 8.4 - 10.5 mg/dL Final  . GFR calc non Af Amer 10/05/2013 78* >90 mL/min Final  . GFR calc Af Amer 10/05/2013 >90  >90 mL/min Final   Comment: (NOTE)                          The eGFR has been calculated using the CKD EPI equation.                          This calculation has not been validated in all clinical situations.                          eGFR's persistently <90 mL/min signify possible Chronic  Kidney                          Disease.  . WBC 10/05/2013 10.3  4.0 - 10.5 K/uL Final  . RBC 10/05/2013 3.46* 4.22 - 5.81 MIL/uL Final  .  Hemoglobin 10/05/2013 9.7* 13.0 - 17.0 g/dL Final  . HCT 10/05/2013 29.9* 39.0 - 52.0 % Final  . MCV 10/05/2013 86.4  78.0 - 100.0 fL Final  . MCH 10/05/2013 28.0  26.0 - 34.0 pg Final  . MCHC 10/05/2013 32.4  30.0 - 36.0 g/dL Final  . RDW 10/05/2013 12.3  11.5 - 15.5 % Final  . Platelets 10/05/2013 246  150 - 400 K/uL Final  Admission on 09/24/2013, Discharged on 09/26/2013  Component Date Value Ref Range Status  . Hemoglobin 09/25/2013 10.2* 13.0 - 17.0 g/dL Final  . HCT 09/25/2013 29.4* 39.0 - 52.0 % Final  Hospital Outpatient Visit on 09/16/2013  Component Date Value Ref Range Status  . WBC 09/16/2013 6.0  4.0 - 10.5 K/uL Final  . RBC 09/16/2013 4.45  4.22 - 5.81 MIL/uL Final  . Hemoglobin 09/16/2013 13.0  13.0 - 17.0 g/dL Final  . HCT 09/16/2013 37.8* 39.0 - 52.0 % Final  . MCV 09/16/2013 84.9  78.0 - 100.0 fL Final  . MCH 09/16/2013 29.2  26.0 - 34.0 pg Final  . MCHC 09/16/2013 34.4  30.0 - 36.0 g/dL Final  . RDW 09/16/2013 12.4  11.5 - 15.5 % Final  . Platelets 09/16/2013 179  150 - 400 K/uL Final  . Sodium 09/16/2013 140  137 - 147 mEq/L Final  . Potassium 09/16/2013 4.1  3.7 - 5.3 mEq/L Final  . Chloride 09/16/2013 105  96 - 112 mEq/L Final  . CO2 09/16/2013 24  19 - 32 mEq/L Final  . Glucose, Bld 09/16/2013 106* 70 - 99 mg/dL Final  . BUN 09/16/2013 13  6 - 23 mg/dL Final  . Creatinine, Ser 09/16/2013 0.91  0.50 - 1.35 mg/dL Final  . Calcium 09/16/2013 8.8  8.4 - 10.5 mg/dL Final  . GFR calc non Af Amer 09/16/2013 81* >90 mL/min Final  . GFR calc Af Amer 09/16/2013 >90  >90 mL/min Final   Comment: (NOTE)                          The eGFR has been calculated using the CKD EPI equation.                          This calculation has not been validated in all clinical situations.                          eGFR's persistently <90 mL/min  signify possible Chronic Kidney                          Disease.  Appointment on 08/05/2013  Component Date Value Ref Range Status  . Cholesterol, Total 08/05/2013 109  100 - 199 mg/dL Final  . Triglycerides 08/05/2013 93  0 - 149 mg/dL Final  . HDL 08/05/2013 51  >39 mg/dL Final   Comment: According to ATP-III Guidelines, HDL-C >59 mg/dL is considered a                          negative risk factor for CHD.  Marland Kitchen VLDL Cholesterol Cal 08/05/2013 19  5 - 40 mg/dL Final  . LDL Calculated 08/05/2013 39  0 - 99 mg/dL Final  . Chol/HDL Ratio 08/05/2013 2.1  0.0 - 5.0 ratio units Final   Comment:  T. Chol/HDL Ratio                                                                      Men  Women                                                        1/2 Avg.Risk  3.4    3.3                                                            Avg.Risk  5.0    4.4                                                         2X Avg.Risk  9.6    7.1                                                         3X Avg.Risk 23.4   11.0  . Glucose 08/05/2013 91  65 - 99 mg/dL Final  . BUN 08/05/2013 14  8 - 27 mg/dL Final  . Creatinine, Ser 08/05/2013 0.89  0.76 - 1.27 mg/dL Final  . GFR calc non Af Amer 08/05/2013 84  >59 mL/min/1.73 Final  . GFR calc Af Amer 08/05/2013 97  >59 mL/min/1.73 Final  . BUN/Creatinine Ratio 08/05/2013 16  10 - 22 Final  . Sodium 08/05/2013 141  134 - 144 mmol/L Final  . Potassium 08/05/2013 4.4  3.5 - 5.2 mmol/L Final  . Chloride 08/05/2013 101  97 - 108 mmol/L Final  . CO2 08/05/2013 22  18 - 29 mmol/L Final  . Calcium 08/05/2013 9.2  8.6 - 10.2 mg/dL Final  . Total Protein 08/05/2013 6.3  6.0 - 8.5 g/dL Final  . Albumin 08/05/2013 4.1  3.5 - 4.8 g/dL Final  . Globulin, Total 08/05/2013 2.2  1.5 - 4.5 g/dL Final  . Albumin/Globulin Ratio 08/05/2013 1.9  1.1 - 2.5 Final  . Total Bilirubin 08/05/2013 1.5* 0.0 - 1.2 mg/dL Final  . Alkaline Phosphatase  08/05/2013 39  39 - 117 IU/L Final  . AST 08/05/2013 22  0 - 40 IU/L Final  . ALT 08/05/2013 22  0 - 44 IU/L Final  . PSA 08/05/2013 3.3  0.0 - 4.0 ng/mL Final   Comment: Roche ECLIA methodology.                          According to the American U[employer] Association, Serum PSA should  decrease and remain at undetectable levels after radical                          prostatectomy. The AUA defines biochemical recurrence as an initial                          PSA value 0.2 ng/mL or greater followed by a subsequent confirmatory                          PSA value 0.2 ng/mL or greater.                          Values obtained with different assay methods or kits cannot be used                          interchangeably. Results cannot be interpreted as absolute evidence                          of the presence or absence of malignant disease.      Assessment/Plan  1. Hydronephrosis of right kidney Stent removal in about 2 weeks  2. Benign hypertrophy of prostate Recent TURP. Voiding better. Still some discomfort in the abd on the right side  3. UTI (urinary tract infection) resolved  4. HYPERTENSION controlled

## 2013-10-14 NOTE — Patient Instructions (Signed)
Continue medications as listed 

## 2013-11-08 ENCOUNTER — Other Ambulatory Visit: Payer: Self-pay | Admitting: Internal Medicine

## 2014-01-06 ENCOUNTER — Other Ambulatory Visit: Payer: Self-pay | Admitting: Internal Medicine

## 2014-02-09 ENCOUNTER — Encounter: Payer: Self-pay | Admitting: Internal Medicine

## 2014-02-09 ENCOUNTER — Ambulatory Visit (INDEPENDENT_AMBULATORY_CARE_PROVIDER_SITE_OTHER): Payer: BC Managed Care – PPO | Admitting: Internal Medicine

## 2014-02-09 VITALS — BP 118/76 | HR 57 | Resp 10 | Ht 64.5 in | Wt 155.0 lb

## 2014-02-09 DIAGNOSIS — N3000 Acute cystitis without hematuria: Secondary | ICD-10-CM

## 2014-02-09 DIAGNOSIS — N400 Enlarged prostate without lower urinary tract symptoms: Secondary | ICD-10-CM

## 2014-02-09 DIAGNOSIS — I2510 Atherosclerotic heart disease of native coronary artery without angina pectoris: Secondary | ICD-10-CM

## 2014-02-09 DIAGNOSIS — K219 Gastro-esophageal reflux disease without esophagitis: Secondary | ICD-10-CM

## 2014-02-09 DIAGNOSIS — E785 Hyperlipidemia, unspecified: Secondary | ICD-10-CM

## 2014-02-09 DIAGNOSIS — I10 Essential (primary) hypertension: Secondary | ICD-10-CM

## 2014-02-09 MED ORDER — ZOSTER VACCINE LIVE 19400 UNT/0.65ML ~~LOC~~ SOLR: 0.6500 mL | Freq: Once | SUBCUTANEOUS | Status: DC

## 2014-02-09 NOTE — Progress Notes (Signed)
Patient ID: Brent Griffith, male   DOB: 29-May-1935, 78 y.o.   MRN: [id number]    Facility  [facility]    Place of Service:   OFFICE   Allergies  Allergen Reactions  . Compazine [Prochlorperazine Edisylate] Anxiety    No chief complaint on file.   HPI:  Had cryosurgery at [facility] today. 3 lesions: right temple, right upper cheek,  and back .  Essential hypertension: controlled  Hyperlipidemia: recheck next time  Gastroesophageal reflux disease without esophagitis: heartburn with tomatoes  Atherosclerosis of native coronary artery of native heart without angina pectoris: stable  Benign hypertrophy of prostate: started on Flomax recently by Dr. Gaynelle Arabian for symptoms of hesitation and slow stream.    Medications: Patient's Medications  New Prescriptions   No medications on file  Previous Medications   ACETAMINOPHEN (TYLENOL) 500 MG TABLET    Take 1,000 mg by mouth every 6 (six) hours as needed for mild pain or moderate pain.   ATENOLOL (TENORMIN) 50 MG TABLET    TAKE 1 TABLET BY MOUTH EVERY DAY TO CONTROL HEART RHYTHM AND LOWER BLOOD PRESSURE   CALCIUM CITRATE 250 MG TABS    Take 250 tablets by mouth daily.    CHOLECALCIFEROL (VITAMIN D) 1000 UNITS TABLET    Take 1,000 Units by mouth daily.     CRESTOR 20 MG TABLET    TAKE 1 TABLET BY MOUTH EVERY DAY   DENOSUMAB (PROLIA) 60 MG/ML SOLN INJECTION    Inject 60 mg into the skin every 6 (six) months. Administer in upper arm, thigh, or abdomen.  Patient receives this injection every 6 months.   DIPHENHYDRAMINE (BENADRYL) 25 MG CAPSULE    Take 50 mg by mouth every 6 (six) hours as needed for allergies.   FINASTERIDE (PROSCAR) 5 MG TABLET    Take 5 mg by mouth every morning.   GLUCOSAMINE 500 MG CAPS    Take 1,000 mg by mouth daily.    HYDROCODONE-ACETAMINOPHEN (NORCO) 5-325 MG PER TABLET    Take 1 tablet by mouth every 6 (six) hours as needed for moderate pain.   MULTIPLE VITAMIN (MULTIVITAMIN) TABLET    Take 1 tablet by  mouth daily.     OMEPRAZOLE (PRILOSEC) 20 MG CAPSULE    TAKE ONE CAPSULE BY MOUTH TWICE A DAY TO REDUCE STOMACH ACID AND PROTECT ESOPHAGUS   POTASSIUM 99 MG TABS    Take 99 mg by mouth daily.   PROBIOTIC PRODUCT (PROBIOTIC DAILY PO)    Take 2 capsules by mouth daily.    TAMSULOSIN (FLOMAX) 0.4 MG CAPS CAPSULE    Take 0.4 mg by mouth daily.  Modified Medications   Modified Medication Previous Medication   ZOSTER VACCINE LIVE, PF, (ZOSTAVAX) 86773 UNT/0.65ML INJECTION zoster vaccine live, PF, (ZOSTAVAX) 73668 UNT/0.65ML injection      Inject 19,400 Units into the skin once.    Inject 0.65 mLs into the skin once.  Discontinued Medications   ATENOLOL (TENORMIN) 50 MG TABLET    Take 50 mg by mouth every morning.    DOXAZOSIN (CARDURA) 4 MG TABLET    Take 4 mg by mouth daily.   OMEPRAZOLE (PRILOSEC) 20 MG CAPSULE    Take 20 mg by mouth 2 (two) times daily before a meal.     Review of Systems  Constitutional: Negative.   HENT: Negative.   Eyes: Negative.   Respiratory: Negative.        Wears CPAP. Followed by Dr. Annamaria Boots.  Cardiovascular: Negative for  chest pain, palpitations and leg swelling.  Gastrointestinal: Negative.   Endocrine: Negative.   Genitourinary:       Hesitation. Slow stream.   Musculoskeletal: Negative.   Skin: Negative.   Allergic/Immunologic: Negative.   Neurological: Negative.   Hematological: Negative.   Psychiatric/Behavioral: Negative.     Filed Vitals:   02/09/14 1500  BP: 118/76  Pulse: 57  Resp: 10  Height: 5' 4.5" (1.638 m)  Weight: 155 lb (70.308 kg)  SpO2: 98%   Body mass index is 26.2 kg/(m^2).  Physical Exam  Constitutional: He is oriented to person, place, and time. He appears well-developed and well-nourished. No distress.  HENT:  Head: Atraumatic.  Right Ear: External ear normal.  Left Ear: External ear normal.  Nose: Nose normal.  Eyes: Conjunctivae and EOM are normal. Pupils are equal, round, and reactive to light.  Neck: Normal range  of motion. Neck supple. No JVD present. No tracheal deviation present. No thyromegaly present.  Cardiovascular: Normal rate, regular rhythm, normal heart sounds and intact distal pulses.  Exam reveals no gallop and no friction rub.   No murmur heard. Pulmonary/Chest: Effort normal and breath sounds normal. No respiratory distress. He has no wheezes. He has no rales. He exhibits no tenderness.  Abdominal: Soft. Bowel sounds are normal. He exhibits no distension and no mass. There is no tenderness. There is no rebound.  Musculoskeletal: Normal range of motion. He exhibits no edema and no tenderness.  Lymphadenopathy:    He has no cervical adenopathy.  Neurological: He is alert and oriented to person, place, and time. He has normal reflexes. No cranial nerve deficit. Coordination normal.  Skin: Skin is warm and dry. No rash noted. No erythema. No pallor.  Psychiatric: He has a normal mood and affect. His behavior is normal. Judgment and thought content normal.     Labs reviewed: No visits with results within 3 Month(s) from this visit. Latest known visit with results is:  Admission on 10/03/2013, Discharged on 10/05/2013  Component Date Value Ref Range Status  . Color, Urine 10/03/2013 AMBER* YELLOW Final   BIOCHEMICALS MAY BE AFFECTED BY COLOR  . APPearance 10/03/2013 TURBID* CLEAR Final  . Specific Gravity, Urine 10/03/2013 1.021  1.005 - 1.030 Final  . pH 10/03/2013 6.0  5.0 - 8.0 Final  . Glucose, UA 10/03/2013 NEGATIVE  NEGATIVE mg/dL Final  . Hgb urine dipstick 10/03/2013 LARGE* NEGATIVE Final  . Bilirubin Urine 10/03/2013 NEGATIVE  NEGATIVE Final  . Ketones, ur 10/03/2013 NEGATIVE  NEGATIVE mg/dL Final  . Protein, ur 10/03/2013 >300* NEGATIVE mg/dL Final  . Urobilinogen, UA 10/03/2013 0.2  0.0 - 1.0 mg/dL Final  . Nitrite 10/03/2013 NEGATIVE  NEGATIVE Final  . Leukocytes, UA 10/03/2013 MODERATE* NEGATIVE Final  . WBC 10/03/2013 8.7  4.0 - 10.5 K/uL Final  . RBC 10/03/2013 3.26*  4.22 - 5.81 MIL/uL Final  . Hemoglobin 10/03/2013 9.9* 13.0 - 17.0 g/dL Final  . HCT 10/03/2013 28.8* 39.0 - 52.0 % Final  . MCV 10/03/2013 88.3  78.0 - 100.0 fL Final  . MCH 10/03/2013 30.4  26.0 - 34.0 pg Final  . MCHC 10/03/2013 34.4  30.0 - 36.0 g/dL Final  . RDW 10/03/2013 12.0  11.5 - 15.5 % Final  . Platelets 10/03/2013 250  150 - 400 K/uL Final  . Neutrophils Relative % 10/03/2013 73  43 - 77 % Final  . Neutro Abs 10/03/2013 6.3  1.7 - 7.7 K/uL Final  . Lymphocytes Relative 10/03/2013 16  12 -  46 % Final  . Lymphs Abs 10/03/2013 1.4  0.7 - 4.0 K/uL Final  . Monocytes Relative 10/03/2013 8  3 - 12 % Final  . Monocytes Absolute 10/03/2013 0.7  0.1 - 1.0 K/uL Final  . Eosinophils Relative 10/03/2013 3  0 - 5 % Final  . Eosinophils Absolute 10/03/2013 0.3  0.0 - 0.7 K/uL Final  . Basophils Relative 10/03/2013 0  0 - 1 % Final  . Basophils Absolute 10/03/2013 0.0  0.0 - 0.1 K/uL Final  . Sodium 10/03/2013 138  137 - 147 mEq/L Final  . Potassium 10/03/2013 4.4  3.7 - 5.3 mEq/L Final  . Chloride 10/03/2013 100  96 - 112 mEq/L Final  . CO2 10/03/2013 24  19 - 32 mEq/L Final  . Glucose, Bld 10/03/2013 130* 70 - 99 mg/dL Final  . BUN 10/03/2013 10  6 - 23 mg/dL Final  . Creatinine, Ser 10/03/2013 1.10  0.50 - 1.35 mg/dL Final  . Calcium 10/03/2013 8.7  8.4 - 10.5 mg/dL Final  . Total Protein 10/03/2013 6.9  6.0 - 8.3 g/dL Final  . Albumin 10/03/2013 3.6  3.5 - 5.2 g/dL Final  . AST 10/03/2013 20  0 - 37 U/L Final  . ALT 10/03/2013 20  0 - 53 U/L Final  . Alkaline Phosphatase 10/03/2013 56  39 - 117 U/L Final  . Total Bilirubin 10/03/2013 0.7  0.3 - 1.2 mg/dL Final  . GFR calc non Af Amer 10/03/2013 64* >90 mL/min Final  . GFR calc Af Amer 10/03/2013 74* >90 mL/min Final   Comment: (NOTE)                          The eGFR has been calculated using the CKD EPI equation.                          This calculation has not been validated in all clinical situations.                           eGFR's persistently <90 mL/min signify possible Chronic Kidney                          Disease.  . Lipase 10/03/2013 48  11 - 59 U/L Final  . Lactic Acid, Venous 10/03/2013 1.08  0.5 - 2.2 mmol/L Final  . Squamous Epithelial / LPF 10/03/2013 RARE  RARE Final  . WBC, UA 10/03/2013 21-50  <3 WBC/hpf Final  . RBC / HPF 10/03/2013 TOO NUMEROUS TO COUNT  <3 RBC/hpf Final  . Bacteria, UA 10/03/2013 FEW* RARE Final  . Specimen Description 10/03/2013 URINE, CLEAN CATCH   Final  . Special Requests 10/03/2013 NONE   Final  . Culture  Setup Time 10/03/2013    Final                   Value:10/03/2013 23:03                         Performed at [facility]  . Colony Count 10/03/2013    Final                   Value:3,000 COLONIES/ML  Performed at [facility]  . Culture 10/03/2013    Final                   Value:INSIGNIFICANT GROWTH                         Performed at [facility]  . Report Status 10/03/2013 10/04/2013 FINAL   Final  . MRSA, PCR 10/04/2013 NEGATIVE  NEGATIVE Final  . Staphylococcus aureus 10/04/2013 NEGATIVE  NEGATIVE Final   Comment:                                 The Xpert SA Assay (FDA                          approved for NASAL specimens                          in patients over 11 years of age),                          is one component of                          a comprehensive surveillance                          program.  Test performance has                          been validated by [employer] for patients greater                          than or equal to 67 year old.                          It is not intended                          to diagnose infection nor to                          guide or monitor treatment.  . Sodium 10/05/2013 140  137 - 147 mEq/L Final  . Potassium 10/05/2013 4.2  3.7 - 5.3 mEq/L Final  . Chloride 10/05/2013 104  96 - 112 mEq/L Final  . CO2 10/05/2013 24  19  - 32 mEq/L Final  . Glucose, Bld 10/05/2013 150* 70 - 99 mg/dL Final  . BUN 10/05/2013 13  6 - 23 mg/dL Final  . Creatinine, Ser 10/05/2013 0.99  0.50 - 1.35 mg/dL Final  . Calcium 10/05/2013 8.0* 8.4 - 10.5 mg/dL Final  . GFR calc non Af Amer 10/05/2013 78* >90 mL/min Final  . GFR calc Af Amer 10/05/2013 >90  >90 mL/min Final   Comment: (NOTE)                          The eGFR  has been calculated using the CKD EPI equation.                          This calculation has not been validated in all clinical situations.                          eGFR's persistently <90 mL/min signify possible Chronic Kidney                          Disease.  . WBC 10/05/2013 10.3  4.0 - 10.5 K/uL Final  . RBC 10/05/2013 3.46* 4.22 - 5.81 MIL/uL Final  . Hemoglobin 10/05/2013 9.7* 13.0 - 17.0 g/dL Final  . HCT 10/05/2013 29.9* 39.0 - 52.0 % Final  . MCV 10/05/2013 86.4  78.0 - 100.0 fL Final  . MCH 10/05/2013 28.0  26.0 - 34.0 pg Final  . MCHC 10/05/2013 32.4  30.0 - 36.0 g/dL Final  . RDW 10/05/2013 12.3  11.5 - 15.5 % Final  . Platelets 10/05/2013 246  150 - 400 K/uL Final     Assessment/Plan  1. Essential hypertension - Comprehensive metabolic panel; Future  2. Hyperlipidemia - Lipid panel; Future  3. Gastroesophageal reflux disease without esophagitis Occasional heartburn  4. Atherosclerosis of native coronary artery of native heart without angina pectoris stable  5. Benign hypertrophy of prostate No dysuria. Mild hesitation  6. Acute cystitis without hematuria - Urinalysis

## 2014-03-09 ENCOUNTER — Other Ambulatory Visit: Payer: Self-pay | Admitting: *Deleted

## 2014-03-09 MED ORDER — ZOSTER VACCINE LIVE 19400 UNT/0.65ML ~~LOC~~ SOLR: 0.6500 mL | Freq: Once | SUBCUTANEOUS | Status: DC

## 2014-03-09 NOTE — Telephone Encounter (Signed)
Patient called and requested to be mailed to them at home address. Signed and mailed.

## 2014-03-19 ENCOUNTER — Emergency Department (HOSPITAL_BASED_OUTPATIENT_CLINIC_OR_DEPARTMENT_OTHER): Payer: BC Managed Care – PPO

## 2014-03-19 ENCOUNTER — Inpatient Hospital Stay (HOSPITAL_BASED_OUTPATIENT_CLINIC_OR_DEPARTMENT_OTHER)
Admission: EM | Admit: 2014-03-19 | Discharge: 2014-03-20 | DRG: 203 | Disposition: A | Discharge status: Home / Self Care | Payer: BC Managed Care – PPO | Attending: Family Medicine | Admitting: Family Medicine

## 2014-03-19 ENCOUNTER — Encounter (HOSPITAL_BASED_OUTPATIENT_CLINIC_OR_DEPARTMENT_OTHER): Payer: Self-pay

## 2014-03-19 DIAGNOSIS — M40209 Unspecified kyphosis, site unspecified: Secondary | ICD-10-CM | POA: Diagnosis present

## 2014-03-19 DIAGNOSIS — Z9079 Acquired absence of other genital organ(s): Secondary | ICD-10-CM | POA: Diagnosis present

## 2014-03-19 DIAGNOSIS — Z8249 Family history of ischemic heart disease and other diseases of the circulatory system: Secondary | ICD-10-CM

## 2014-03-19 DIAGNOSIS — I10 Essential (primary) hypertension: Secondary | ICD-10-CM | POA: Diagnosis present

## 2014-03-19 DIAGNOSIS — R0602 Shortness of breath: Secondary | ICD-10-CM | POA: Diagnosis present

## 2014-03-19 DIAGNOSIS — J069 Acute upper respiratory infection, unspecified: Secondary | ICD-10-CM | POA: Diagnosis present

## 2014-03-19 DIAGNOSIS — J40 Bronchitis, not specified as acute or chronic: Principal | ICD-10-CM | POA: Diagnosis present

## 2014-03-19 DIAGNOSIS — Z823 Family history of stroke: Secondary | ICD-10-CM

## 2014-03-19 DIAGNOSIS — Z888 Allergy status to other drugs, medicaments and biological substances status: Secondary | ICD-10-CM

## 2014-03-19 DIAGNOSIS — J309 Allergic rhinitis, unspecified: Secondary | ICD-10-CM | POA: Diagnosis present

## 2014-03-19 DIAGNOSIS — R05 Cough: Secondary | ICD-10-CM

## 2014-03-19 DIAGNOSIS — N400 Enlarged prostate without lower urinary tract symptoms: Secondary | ICD-10-CM | POA: Diagnosis present

## 2014-03-19 DIAGNOSIS — E785 Hyperlipidemia, unspecified: Secondary | ICD-10-CM | POA: Diagnosis present

## 2014-03-19 DIAGNOSIS — M1990 Unspecified osteoarthritis, unspecified site: Secondary | ICD-10-CM | POA: Diagnosis present

## 2014-03-19 DIAGNOSIS — I2510 Atherosclerotic heart disease of native coronary artery without angina pectoris: Secondary | ICD-10-CM | POA: Diagnosis present

## 2014-03-19 DIAGNOSIS — K219 Gastro-esophageal reflux disease without esophagitis: Secondary | ICD-10-CM | POA: Diagnosis present

## 2014-03-19 DIAGNOSIS — R7989 Other specified abnormal findings of blood chemistry: Secondary | ICD-10-CM

## 2014-03-19 DIAGNOSIS — Z951 Presence of aortocoronary bypass graft: Secondary | ICD-10-CM

## 2014-03-19 DIAGNOSIS — R778 Other specified abnormalities of plasma proteins: Secondary | ICD-10-CM

## 2014-03-19 DIAGNOSIS — M810 Age-related osteoporosis without current pathological fracture: Secondary | ICD-10-CM | POA: Diagnosis present

## 2014-03-19 DIAGNOSIS — G4733 Obstructive sleep apnea (adult) (pediatric): Secondary | ICD-10-CM | POA: Diagnosis present

## 2014-03-19 DIAGNOSIS — R059 Cough, unspecified: Secondary | ICD-10-CM

## 2014-03-19 LAB — BASIC METABOLIC PANEL
Anion gap: 14 (ref 5–15)
BUN: 15 mg/dL (ref 6–23)
CO2: 23 meq/L (ref 19–32)
Calcium: 8.8 mg/dL (ref 8.4–10.5)
Chloride: 101 mEq/L (ref 96–112)
Creatinine, Ser: 1 mg/dL (ref 0.50–1.35)
GFR calc Af Amer: 82 mL/min — ABNORMAL LOW (ref >90)
GFR, EST NON AFRICAN AMERICAN: 71 mL/min — AB (ref >90)
Glucose, Bld: 157 mg/dL — ABNORMAL HIGH (ref 70–99)
Potassium: 3.6 mEq/L — ABNORMAL LOW (ref 3.7–5.3)
Sodium: 138 mEq/L (ref 137–147)

## 2014-03-19 LAB — CBC WITH DIFFERENTIAL/PLATELET
Basophils Absolute: 0 10*3/uL (ref 0.0–0.1)
Basophils Relative: 0 % (ref 0–1)
EOS ABS: 0.4 10*3/uL (ref 0.0–0.7)
EOS PCT: 5 % (ref 0–5)
HCT: 37 % — ABNORMAL LOW (ref 39.0–52.0)
Hemoglobin: 12.5 g/dL — ABNORMAL LOW (ref 13.0–17.0)
LYMPHS ABS: 1.7 10*3/uL (ref 0.7–4.0)
Lymphocytes Relative: 24 % (ref 12–46)
MCH: 28 pg (ref 26.0–34.0)
MCHC: 33.8 g/dL (ref 30.0–36.0)
MCV: 82.8 fL (ref 78.0–100.0)
Monocytes Absolute: 0.6 10*3/uL (ref 0.1–1.0)
Monocytes Relative: 9 % (ref 3–12)
Neutro Abs: 4.2 10*3/uL (ref 1.7–7.7)
Neutrophils Relative %: 62 % (ref 43–77)
Platelets: 117 10*3/uL — ABNORMAL LOW (ref 150–400)
RBC: 4.47 MIL/uL (ref 4.22–5.81)
RDW: 15.3 % (ref 11.5–15.5)
WBC: 6.8 10*3/uL (ref 4.0–10.5)

## 2014-03-19 LAB — TROPONIN I: Troponin I: 0.61 ng/mL (ref <0.30)

## 2014-03-19 MED ORDER — ALBUTEROL SULFATE (2.5 MG/3ML) 0.083% IN NEBU
5.0000 mg | INHALATION_SOLUTION | Freq: Once | RESPIRATORY_TRACT | Status: AC
  Administered 2014-03-19: 5 mg via RESPIRATORY_TRACT
  Filled 2014-03-19: qty 6

## 2014-03-19 NOTE — ED Notes (Signed)
MD at bedside. 

## 2014-03-19 NOTE — ED Notes (Addendum)
Critical Troponin 0.61. Dr Doy Mince notified.

## 2014-03-19 NOTE — ED Provider Notes (Signed)
CSN: [id number]     Arrival date & time 03/19/14  1930 History  This chart was scribed for Brent Delay, MD by Starleen Arms, ED Scribe. This patient was seen in room MH09/MH09 and the patient's care was started at 8:32 PM.   Chief Complaint  Patient presents with  . Cough   Patient is a 78 y.o. male presenting with cough. The history is provided by the patient. No language interpreter was used.  Cough Cough characteristics:  Productive Sputum characteristics:  Nondescript Onset quality:  Gradual Duration:  4 days Timing:  Constant Progression:  Worsening Chronicity:  New Smoker: no   Relieved by:  Nothing Worsened by:  Nothing tried Ineffective treatments:  None tried Associated symptoms: sore throat   Associated symptoms: no fever, no rhinorrhea and no shortness of breath    HPI Comments: Brent Griffith is a 78 y.o. male who presents to the Emergency Department complaining of a worsening productive cough with associated sore throat onset 4 days ago.  Patient reports that he was eating dinner 3 hours ago when his vocal chords closed and he began having difficulty breathing for approximately 30 seconds.  He reports previous episodes of this occurrence and states he was given a nebulizer treatment which alleviated his condition.  Patient denies SOB, fever, rhinorrhea.     Past Medical History  Diagnosis Date  . Unspecified essential hypertension   . Esophageal reflux   . Hypersomnia with sleep apnea, unspecified   . Asthma   . Hyperlipidemia   . CAD (coronary artery disease)   . Enlarged prostate   . Kyphosis   . Trigger finger (acquired)   . Anemia, unspecified   . Lumbago   . Senile osteoporosis   . Unspecified vitamin D deficiency   . Elevated prostate specific antigen (PSA)   . Herpes zoster without mention of complication   . Acute gastric ulcer without mention of hemorrhage, perforation, or obstruction   . Impotence of organic origin   . Allergic rhinitis, cause  unspecified   . Extrinsic asthma, unspecified   . Diaphragmatic hernia without mention of obstruction or gangrene   . Blood transfusion without reported diagnosis   . Unspecified sleep apnea     CPAP- settings 4-12   . Obstructive sleep apnea (adult) (pediatric)   . Pneumonia     hx of years ago   . Arthritis    Past Surgical History  Procedure Laterality Date  . Tonsillectomy  1945  . Shoulder surgery Left 04/1998    Dr French Ana  . Mole removal  1958  . Cardiac catheterization  03/04/2009    Dr [name]  . Colonoscopy    . Coronary artery bypass graft  2010  . Transurethral resection of prostate N/A 09/24/2013    Procedure: TRANSURETHRAL RESECTION OF THE PROSTATE (TURP) WITH GYRUS (STAGED RIGHT LATERAL AND MEDIAN LOBE);  Surgeon: Ailene Rud, MD;  Location: [facility];  Service: Urology;  Laterality: N/A;  . Cystoscopy with retrograde pyelogram, ureteroscopy and stent placement Right 10/04/2013    Procedure: Aromas,  AND STENT PLACEMENT;  Surgeon: Festus Aloe, MD;  Location: [facility];  Service: Urology;  Laterality: Right;   Family History  Problem Relation Age of Onset  . Stroke Mother   . Hypertension Father   . Stroke Father   . Heart disease Father   . Heart disease Paternal Grandfather   . Colon cancer Neg Hx    History  Substance Use Topics  .  Smoking status: Never Smoker   . Smokeless tobacco: Never Used  . Alcohol Use: No    Review of Systems  Constitutional: Negative for fever.  HENT: Positive for postnasal drip and sore throat. Negative for rhinorrhea.   Respiratory: Positive for cough. Negative for shortness of breath.   All other systems reviewed and are negative.     Allergies  Compazine  Home Medications   Prior to Admission medications   Medication Sig Start Date End Date Taking? Authorizing Provider  acetaminophen (TYLENOL) 500 MG tablet Take 1,000 mg by mouth every 6 (six) hours as needed for mild pain  or moderate pain.    Historical Provider, MD  atenolol (TENORMIN) 50 MG tablet TAKE 1 TABLET BY MOUTH EVERY DAY TO CONTROL HEART RHYTHM AND LOWER BLOOD PRESSURE 01/06/14   Blanchie Serve, MD  Calcium Citrate 250 MG TABS Take 250 tablets by mouth daily.     Historical Provider, MD  cholecalciferol (VITAMIN D) 1000 UNITS tablet Take 1,000 Units by mouth daily.      Historical Provider, MD  CRESTOR 20 MG tablet TAKE 1 TABLET BY MOUTH EVERY DAY    Mahima Pandey, MD  denosumab (PROLIA) 60 MG/ML SOLN injection Inject 60 mg into the skin every 6 (six) months. Administer in upper arm, thigh, or abdomen.  Patient receives this injection every 6 months.    Historical Provider, MD  diphenhydrAMINE (BENADRYL) 25 mg capsule Take 50 mg by mouth every 6 (six) hours as needed for allergies.    Historical Provider, MD  finasteride (PROSCAR) 5 MG tablet Take 5 mg by mouth every morning.    Historical Provider, MD  Glucosamine 500 MG CAPS Take 1,000 mg by mouth daily.     Historical Provider, MD  HYDROcodone-acetaminophen (NORCO) 5-325 MG per tablet Take 1 tablet by mouth every 6 (six) hours as needed for moderate pain. 09/26/13   Reece Packer, MD  Multiple Vitamin (MULTIVITAMIN) tablet Take 1 tablet by mouth daily.      Historical Provider, MD  omeprazole (PRILOSEC) 20 MG capsule TAKE ONE CAPSULE BY MOUTH TWICE A DAY TO REDUCE STOMACH ACID AND PROTECT ESOPHAGUS    Estill Dooms, MD  Potassium 99 MG TABS Take 99 mg by mouth daily.    Historical Provider, MD  Probiotic Product (PROBIOTIC DAILY PO) Take 2 capsules by mouth daily.     Historical Provider, MD  tamsulosin (FLOMAX) 0.4 MG CAPS capsule Take 0.4 mg by mouth daily.    Historical Provider, MD  zoster vaccine live, PF, (ZOSTAVAX) 51884 UNT/0.65ML injection Inject 19,400 Units into the skin once. 03/09/14   Mahima Pandey, MD   BP 135/58 mmHg  Pulse 74  Temp(Src) 98.3 F (36.8 C) (Oral)  Resp 18  Ht 5\' 5"  (1.651 m)  Wt 155 lb (70.308 kg)  BMI 25.79 kg/m2   SpO2 94% Physical Exam  Constitutional: He is oriented to person, place, and time. He appears well-developed and well-nourished. No distress.  HENT:  Head: Normocephalic and atraumatic.  Mouth/Throat: Oropharynx is clear and moist.  Eyes: Conjunctivae are normal. Pupils are equal, round, and reactive to light. No scleral icterus.  Neck: Neck supple.  Cardiovascular: Normal rate, regular rhythm, normal heart sounds and intact distal pulses.   No murmur heard. Pulmonary/Chest: Effort normal and breath sounds normal. No stridor. No respiratory distress. He has no wheezes. He has no rales. He exhibits no tenderness.  Frequent cough  Abdominal: Soft. He exhibits no distension. There is no tenderness.  Musculoskeletal: Normal range of motion. He exhibits no edema.  Neurological: He is alert and oriented to person, place, and time.  Skin: Skin is warm and dry. No rash noted.  Psychiatric: He has a normal mood and affect. His behavior is normal.  Nursing note and vitals reviewed.   ED Course  Procedures (including critical care time)  DIAGNOSTIC STUDIES: Oxygen Saturation is 94% on RA, adequate by my interpretation.    COORDINATION OF CARE:  8:46 PM Will order labs and breathing treatment.  Patient acknowledges and agrees with plan.    Labs Review Labs Reviewed  CBC WITH DIFFERENTIAL - Abnormal; Notable for the following:    Hemoglobin 12.5 (*)    HCT 37.0 (*)    Platelets 117 (*)    All other components within normal limits  BASIC METABOLIC PANEL - Abnormal; Notable for the following:    Potassium 3.6 (*)    Glucose, Bld 157 (*)    GFR calc non Af Amer 71 (*)    GFR calc Af Amer 82 (*)    All other components within normal limits  TROPONIN I - Abnormal; Notable for the following:    Troponin I 0.61 (*)    All other components within normal limits    Imaging Review Dg Chest 2 View  03/19/2014   CLINICAL DATA:  Cough since Monday with sore throat.  EXAM: CHEST  2 VIEW   COMPARISON:  Chest radiograph 06/03/2013  FINDINGS: Changes of median sternotomy for CABG. The heart size is normal. Pulmonary vascularity is normal. Lungs are hyperinflated and there is flattening of the hemidiaphragms. No airspace disease, effusion, or pneumothorax. Patient is slightly kyphotic, but no discrete height loss of the thoracic vertebral bodies is seen.  IMPRESSION: Hyperinflation of the lungs. This could be the secondary to asthma or COPD related to smoking. No airspace disease or other acute findings are seen.  Prior CABG.   Electronically Signed   By: Curlene Dolphin M.D.   On: 03/19/2014 20:17  All radiology studies independently viewed by me.      EKG Interpretation   Date/Time:  Friday March 19 2014 22:32:37 EST Ventricular Rate:  88 PR Interval:  150 QRS Duration: 106 QT Interval:  382 QTC Calculation: 462 R Axis:   4 Text Interpretation:  Normal sinus rhythm Septal infarct , age  undetermined ST \\T \ T wave abnormality, consider lateral ischemia Abnormal  ECG compared to prior, lateral ST/T abnormalities not as significant.   Confirmed by [facility]  MD, TREY ([id number]) on 03/19/2014 10:53:30 PM      MDM   Final diagnoses:  Elevated troponin  cough Shortness of breath   78 year old male presenting after an episode of severe difficulty breathing. He states that his vocal cords closed, which he states has happened to him in the past. He stated the episode lasted approximately 30 seconds. He was in significant distress at that time, but denies syncope or near syncope. Now he feels better, but states he feels that the symptoms might return. In the past, with similar symptoms, he gets significant relief with albuterol.    He denied chest pain at any point.  However, his troponin was elevated at 0.61.  He took a full strength aspirin this evening approximately 6 PMm, as he does every day.  Unclear whether this positive troponin represents demand ischemia or an acute cardiac  event.  However, I feel he needs to be observed in the hospital with serial troponins and further workup.  I discussed this with Dr. Posey Pronto (hospital medicine), who will admit.  I also discussed this with Dr. Clayborne Artist (Cardiology), who will consult upon patient's arrival to [facility].  We discussed whether or not to start heparin and we felt that due to patient's very atypical symptoms, we should hold off at this time.    I personally performed the services described in this documentation, which was scribed in my presence. The recorded information has been reviewed and is accurate.     Brent Delay, MD 03/20/14 [phone]

## 2014-03-19 NOTE — ED Notes (Signed)
Pt reports cough since Monday with sore throat.  Denies sputum.  REports feels as though vocal chords are swollen.

## 2014-03-19 NOTE — ED Notes (Signed)
Dr Doy Mince notified of troponin 0.61.

## 2014-03-20 ENCOUNTER — Encounter (HOSPITAL_COMMUNITY): Payer: Self-pay | Admitting: *Deleted

## 2014-03-20 DIAGNOSIS — Z8249 Family history of ischemic heart disease and other diseases of the circulatory system: Secondary | ICD-10-CM | POA: Diagnosis not present

## 2014-03-20 DIAGNOSIS — N400 Enlarged prostate without lower urinary tract symptoms: Secondary | ICD-10-CM | POA: Diagnosis present

## 2014-03-20 DIAGNOSIS — R0602 Shortness of breath: Secondary | ICD-10-CM | POA: Diagnosis not present

## 2014-03-20 DIAGNOSIS — M1990 Unspecified osteoarthritis, unspecified site: Secondary | ICD-10-CM | POA: Diagnosis present

## 2014-03-20 DIAGNOSIS — I25110 Atherosclerotic heart disease of native coronary artery with unstable angina pectoris: Secondary | ICD-10-CM

## 2014-03-20 DIAGNOSIS — G4733 Obstructive sleep apnea (adult) (pediatric): Secondary | ICD-10-CM | POA: Diagnosis present

## 2014-03-20 DIAGNOSIS — M40209 Unspecified kyphosis, site unspecified: Secondary | ICD-10-CM | POA: Diagnosis present

## 2014-03-20 DIAGNOSIS — Z823 Family history of stroke: Secondary | ICD-10-CM | POA: Diagnosis not present

## 2014-03-20 DIAGNOSIS — R072 Precordial pain: Secondary | ICD-10-CM

## 2014-03-20 DIAGNOSIS — I10 Essential (primary) hypertension: Secondary | ICD-10-CM | POA: Diagnosis present

## 2014-03-20 DIAGNOSIS — K219 Gastro-esophageal reflux disease without esophagitis: Secondary | ICD-10-CM | POA: Diagnosis present

## 2014-03-20 DIAGNOSIS — M810 Age-related osteoporosis without current pathological fracture: Secondary | ICD-10-CM | POA: Diagnosis present

## 2014-03-20 DIAGNOSIS — E785 Hyperlipidemia, unspecified: Secondary | ICD-10-CM | POA: Diagnosis present

## 2014-03-20 DIAGNOSIS — Z888 Allergy status to other drugs, medicaments and biological substances status: Secondary | ICD-10-CM | POA: Diagnosis not present

## 2014-03-20 DIAGNOSIS — R7989 Other specified abnormal findings of blood chemistry: Secondary | ICD-10-CM

## 2014-03-20 DIAGNOSIS — I2510 Atherosclerotic heart disease of native coronary artery without angina pectoris: Secondary | ICD-10-CM | POA: Diagnosis present

## 2014-03-20 DIAGNOSIS — J069 Acute upper respiratory infection, unspecified: Secondary | ICD-10-CM

## 2014-03-20 DIAGNOSIS — I214 Non-ST elevation (NSTEMI) myocardial infarction: Secondary | ICD-10-CM | POA: Insufficient documentation

## 2014-03-20 DIAGNOSIS — R05 Cough: Secondary | ICD-10-CM | POA: Diagnosis present

## 2014-03-20 DIAGNOSIS — Z9079 Acquired absence of other genital organ(s): Secondary | ICD-10-CM | POA: Diagnosis present

## 2014-03-20 DIAGNOSIS — G4730 Sleep apnea, unspecified: Secondary | ICD-10-CM

## 2014-03-20 DIAGNOSIS — J40 Bronchitis, not specified as acute or chronic: Secondary | ICD-10-CM | POA: Diagnosis present

## 2014-03-20 DIAGNOSIS — Z951 Presence of aortocoronary bypass graft: Secondary | ICD-10-CM | POA: Diagnosis not present

## 2014-03-20 DIAGNOSIS — J309 Allergic rhinitis, unspecified: Secondary | ICD-10-CM | POA: Diagnosis present

## 2014-03-20 LAB — TROPONIN I
Troponin I: <0.3 ng/mL (ref <0.30)
Troponin I: <0.3 ng/mL (ref <0.30)

## 2014-03-20 LAB — CBC
HEMATOCRIT: 36.7 % — AB (ref 39.0–52.0)
Hemoglobin: 12.1 g/dL — ABNORMAL LOW (ref 13.0–17.0)
MCH: 27.6 pg (ref 26.0–34.0)
MCHC: 33 g/dL (ref 30.0–36.0)
MCV: 83.8 fL (ref 78.0–100.0)
PLATELETS: 111 10*3/uL — AB (ref 150–400)
RBC: 4.38 MIL/uL (ref 4.22–5.81)
RDW: 15.4 % (ref 11.5–15.5)
WBC: 5.1 10*3/uL (ref 4.0–10.5)

## 2014-03-20 LAB — BASIC METABOLIC PANEL
Anion gap: 13 (ref 5–15)
BUN: 13 mg/dL (ref 6–23)
CALCIUM: 8.9 mg/dL (ref 8.4–10.5)
CO2: 25 meq/L (ref 19–32)
CREATININE: 0.98 mg/dL (ref 0.50–1.35)
Chloride: 102 mEq/L (ref 96–112)
GFR calc Af Amer: >90 mL/min (ref >90)
GFR, EST NON AFRICAN AMERICAN: 78 mL/min — AB (ref >90)
GLUCOSE: 117 mg/dL — AB (ref 70–99)
Potassium: 4.3 mEq/L (ref 3.7–5.3)
SODIUM: 140 meq/L (ref 137–147)

## 2014-03-20 LAB — MRSA PCR SCREENING: MRSA BY PCR: NEGATIVE

## 2014-03-20 MED ORDER — PANTOPRAZOLE SODIUM 40 MG PO TBEC
40.0000 mg | DELAYED_RELEASE_TABLET | Freq: Every day | ORAL | Status: DC
  Administered 2014-03-20: 40 mg via ORAL
  Filled 2014-03-20: qty 1

## 2014-03-20 MED ORDER — HYDROMORPHONE HCL 1 MG/ML IJ SOLN: 0.5000 mg | INTRAMUSCULAR | Status: DC | PRN

## 2014-03-20 MED ORDER — ACETAMINOPHEN 325 MG PO TABS: 650.0000 mg | ORAL_TABLET | Freq: Four times a day (QID) | ORAL | Status: DC | PRN

## 2014-03-20 MED ORDER — TAMSULOSIN HCL 0.4 MG PO CAPS
0.4000 mg | ORAL_CAPSULE | Freq: Every day | ORAL | Status: DC
  Administered 2014-03-20: 0.4 mg via ORAL
  Filled 2014-03-20: qty 1

## 2014-03-20 MED ORDER — ACETAMINOPHEN 650 MG RE SUPP: 650.0000 mg | Freq: Four times a day (QID) | RECTAL | Status: DC | PRN

## 2014-03-20 MED ORDER — ATENOLOL 50 MG PO TABS
50.0000 mg | ORAL_TABLET | Freq: Every day | ORAL | Status: DC
  Administered 2014-03-20: 50 mg via ORAL
  Filled 2014-03-20: qty 1

## 2014-03-20 MED ORDER — ADULT MULTIVITAMIN W/MINERALS CH
1.0000 | ORAL_TABLET | Freq: Every day | ORAL | Status: DC
  Administered 2014-03-20: 1 via ORAL
  Filled 2014-03-20: qty 1

## 2014-03-20 MED ORDER — RISAQUAD PO CAPS
1.0000 | ORAL_CAPSULE | Freq: Every day | ORAL | Status: DC
  Administered 2014-03-20: 1 via ORAL
  Filled 2014-03-20: qty 1

## 2014-03-20 MED ORDER — ALUM & MAG HYDROXIDE-SIMETH 200-200-20 MG/5ML PO SUSP: 30.0000 mL | Freq: Four times a day (QID) | ORAL | Status: DC | PRN

## 2014-03-20 MED ORDER — OXYCODONE HCL 5 MG PO TABS: 5.0000 mg | ORAL_TABLET | ORAL | Status: DC | PRN

## 2014-03-20 MED ORDER — ONDANSETRON HCL 4 MG/2ML IJ SOLN: 4.0000 mg | Freq: Four times a day (QID) | INTRAMUSCULAR | Status: DC | PRN

## 2014-03-20 MED ORDER — CALCIUM CARBONATE 1250 (500 CA) MG PO TABS
1250.0000 mg | ORAL_TABLET | Freq: Every day | ORAL | Status: DC
  Administered 2014-03-20: 1250 mg via ORAL
  Filled 2014-03-20 (×2): qty 1

## 2014-03-20 MED ORDER — ALBUTEROL SULFATE (2.5 MG/3ML) 0.083% IN NEBU
2.5000 mg | INHALATION_SOLUTION | RESPIRATORY_TRACT | Status: DC | PRN
  Filled 2014-03-20: qty 3

## 2014-03-20 MED ORDER — SODIUM CHLORIDE 0.9 % IV SOLN
INTRAVENOUS | Status: DC
  Administered 2014-03-20: 03:00:00 via INTRAVENOUS

## 2014-03-20 MED ORDER — FINASTERIDE 5 MG PO TABS
5.0000 mg | ORAL_TABLET | Freq: Every morning | ORAL | Status: DC
  Administered 2014-03-20: 5 mg via ORAL
  Filled 2014-03-20: qty 1

## 2014-03-20 MED ORDER — DIPHENHYDRAMINE HCL 25 MG PO CAPS: 50.0000 mg | ORAL_CAPSULE | Freq: Four times a day (QID) | ORAL | Status: DC | PRN

## 2014-03-20 MED ORDER — AZITHROMYCIN 250 MG PO TABS: ORAL_TABLET | ORAL | Status: DC

## 2014-03-20 MED ORDER — ROSUVASTATIN CALCIUM 20 MG PO TABS
20.0000 mg | ORAL_TABLET | Freq: Every day | ORAL | Status: DC
  Administered 2014-03-20: 20 mg via ORAL
  Filled 2014-03-20: qty 1

## 2014-03-20 MED ORDER — ENOXAPARIN SODIUM 40 MG/0.4ML ~~LOC~~ SOLN
40.0000 mg | Freq: Every day | SUBCUTANEOUS | Status: DC
  Administered 2014-03-20: 40 mg via SUBCUTANEOUS
  Filled 2014-03-20: qty 0.4

## 2014-03-20 MED ORDER — ALBUTEROL SULFATE (2.5 MG/3ML) 0.083% IN NEBU
2.5000 mg | INHALATION_SOLUTION | Freq: Four times a day (QID) | RESPIRATORY_TRACT | Status: DC
  Administered 2014-03-20 (×3): 2.5 mg via RESPIRATORY_TRACT
  Filled 2014-03-20 (×2): qty 3

## 2014-03-20 MED ORDER — DIPHENHYDRAMINE HCL 50 MG/ML IJ SOLN: 25.0000 mg | Freq: Four times a day (QID) | INTRAMUSCULAR | Status: DC | PRN

## 2014-03-20 MED ORDER — ASPIRIN 325 MG PO TABS
325.0000 mg | ORAL_TABLET | Freq: Every day | ORAL | Status: DC
  Administered 2014-03-20: 325 mg via ORAL
  Filled 2014-03-20: qty 1

## 2014-03-20 MED ORDER — NITROGLYCERIN IN D5W 200-5 MCG/ML-% IV SOLN
0.0000 ug/min | INTRAVENOUS | Status: DC
Start: 2014-03-20 — End: 2014-03-20
  Filled 2014-03-20: qty 250

## 2014-03-20 MED ORDER — VITAMIN D3 25 MCG (1000 UNIT) PO TABS
1000.0000 [IU] | ORAL_TABLET | Freq: Every day | ORAL | Status: DC
  Administered 2014-03-20: 1000 [IU] via ORAL
  Filled 2014-03-20: qty 1

## 2014-03-20 MED ORDER — ONDANSETRON HCL 4 MG PO TABS: 4.0000 mg | ORAL_TABLET | Freq: Four times a day (QID) | ORAL | Status: DC | PRN

## 2014-03-20 MED ORDER — HYDROCOD POLST-CHLORPHEN POLST 10-8 MG/5ML PO LQCR
5.0000 mL | Freq: Two times a day (BID) | ORAL | Status: DC | PRN
  Administered 2014-03-20: 5 mL via ORAL
  Filled 2014-03-20: qty 5

## 2014-03-20 NOTE — Plan of Care (Signed)
Problem: Phase II Progression Outcomes Goal: Pain controlled Outcome: Completed/Met Date Met:  03/20/14

## 2014-03-20 NOTE — Progress Notes (Signed)
Discussed discharge instruction with pt and wife. Both verbalize understanding. No prescriptions given, med called in to pharmacy for pickup. IV removed, pt's personal belongings with him.

## 2014-03-20 NOTE — Progress Notes (Deleted)
Physician Discharge Summary  Brent Griffith WPY:[id number] DOB: 06/01/1935 DOA: 03/19/2014  PCP: Estill Dooms, MD  Admit date: 03/19/2014 Discharge date: 03/20/2014  Time spent: 25 minutes  Recommendations for Outpatient Follow-up:  1. Follow-up withPRIMARY care physician for further management    Discharge Diagnoses:  Active Problems:   Essential hypertension   Sleep apnea   Coronary atherosclerosis of native coronary artery   Shortness of breath   Acute upper respiratory infection   Discharge Condition: good  Diet recommendation: heart healthy  Filed Weights   03/19/14 1936 03/20/14 0205  Weight: 70.308 kg (155 lb) 69.5 kg (153 lb 3.5 oz)    History of present illness:  78 y/o ?, s/p TURP 09/24/13 [BPH]-resulting need for R urethral stent Nephrostomy, possible Barret's,  CABG x 4 2010, OSA on Bipap admitted 11/14/a.m. With cough congestion and increasing shortness of breath no fever no chills and felt as if throat was closing up. Found to have positive troponin level is 0.61 Admitted to hospital and troponin cycle cardiology consulted and started on azithromycin by mouth.echocardiogram showed no wall motion abnormalities and repeat troponins were negative patient discharged home to complete 5 days of azithromycin   Discharge Exam: Filed Vitals:   03/20/14 1100  BP: 104/79  Pulse: 87  Temp: 98.4 F (36.9 C)  Resp: 16    General: alert pleasant oriented Cardiovascular: S1-S2 no murmur rub or gallop Respiratory: clear  Discharge Instructions You were cared for by a hospitalist during your hospital stay. If you have any questions about your discharge medications or the care you received while you were in the hospital after you are discharged, you can call the unit and asked to speak with the hospitalist on call if the hospitalist that took care of you is not available. Once you are discharged, your primary care physician will handle any further medical issues.  Please note that NO REFILLS for any discharge medications will be authorized once you are discharged, as it is imperative that you return to your primary care physician (or establish a relationship with a primary care physician if you do not have one) for your aftercare needs so that they can reassess your need for medications and monitor your lab values.  Discharge Instructions    Diet - low sodium heart healthy    Complete by:  As directed      Discharge instructions    Complete by:  As directed   Follow up with your regular MD You did not have any concerns for a Coronary event     Increase activity slowly    Complete by:  As directed           Current Discharge Medication List    CONTINUE these medications which have NOT CHANGED   Details  acetaminophen (TYLENOL) 500 MG tablet Take 1,000 mg by mouth every 6 (six) hours as needed for mild pain or moderate pain.    aspirin 325 MG EC tablet Take 325 mg by mouth daily.    atenolol (TENORMIN) 50 MG tablet TAKE 1 TABLET BY MOUTH EVERY DAY TO CONTROL HEART RHYTHM AND LOWER BLOOD PRESSURE Qty: 90 tablet, Refills: 3    Calcium Citrate 250 MG TABS Take 250 tablets by mouth daily.     cholecalciferol (VITAMIN D) 1000 UNITS tablet Take 1,000 Units by mouth daily.      CRESTOR 20 MG tablet TAKE 1 TABLET BY MOUTH EVERY DAY Qty: 90 tablet, Refills: 3    diphenhydrAMINE (BENADRYL)  25 mg capsule Take 50 mg by mouth every 6 (six) hours as needed for allergies.    finasteride (PROSCAR) 5 MG tablet Take 5 mg by mouth every morning.    Glucosamine 500 MG CAPS Take 1,000 mg by mouth daily.     HYDROcodone-acetaminophen (NORCO) 5-325 MG per tablet Take 1 tablet by mouth every 6 (six) hours as needed for moderate pain. Qty: 30 tablet, Refills: 0    Multiple Vitamin (MULTIVITAMIN) tablet Take 1 tablet by mouth daily.      omeprazole (PRILOSEC) 20 MG capsule TAKE ONE CAPSULE BY MOUTH TWICE A DAY TO REDUCE STOMACH ACID AND PROTECT ESOPHAGUS Qty:  180 capsule, Refills: 1    Potassium 99 MG TABS Take 99 mg by mouth daily.    Probiotic Product (PROBIOTIC DAILY PO) Take 2 capsules by mouth daily.     tamsulosin (FLOMAX) 0.4 MG CAPS capsule Take 0.4 mg by mouth daily.    denosumab (PROLIA) 60 MG/ML SOLN injection Inject 60 mg into the skin every 6 (six) months. Administer in upper arm, thigh, or abdomen.  Patient receives this injection every 6 months.       Allergies  Allergen Reactions  . Compazine [Prochlorperazine Edisylate] Anxiety      The results of significant diagnostics from this hospitalization (including imaging, microbiology, ancillary and laboratory) are listed below for reference.    Significant Diagnostic Studies: Dg Chest 2 View  03/19/2014   CLINICAL DATA:  Cough since Monday with sore throat.  EXAM: CHEST  2 VIEW  COMPARISON:  Chest radiograph 06/03/2013  FINDINGS: Changes of median sternotomy for CABG. The heart size is normal. Pulmonary vascularity is normal. Lungs are hyperinflated and there is flattening of the hemidiaphragms. No airspace disease, effusion, or pneumothorax. Patient is slightly kyphotic, but no discrete height loss of the thoracic vertebral bodies is seen.  IMPRESSION: Hyperinflation of the lungs. This could be the secondary to asthma or COPD related to smoking. No airspace disease or other acute findings are seen.  Prior CABG.   Electronically Signed   By: Curlene Dolphin M.D.   On: 03/19/2014 20:17    Microbiology: Recent Results (from the past 240 hour(s))  MRSA PCR Screening     Status: None   Collection Time: 03/20/14  2:26 AM  Result Value Ref Range Status   MRSA by PCR NEGATIVE NEGATIVE Final    Comment:        The GeneXpert MRSA Assay (FDA approved for NASAL specimens only), is one component of a comprehensive MRSA colonization surveillance program. It is not intended to diagnose MRSA infection nor to guide or monitor treatment for MRSA infections.      Labs: Basic  Metabolic Panel:  Recent Labs Lab 03/19/14 2226 03/20/14 0341  NA 138 140  K 3.6* 4.3  CL 101 102  CO2 23 25  GLUCOSE 157* 117*  BUN 15 13  CREATININE 1.00 0.98  CALCIUM 8.8 8.9   Liver Function Tests: No results for input(s): AST, ALT, ALKPHOS, BILITOT, PROT, ALBUMIN in the last 168 hours. No results for input(s): LIPASE, AMYLASE in the last 168 hours. No results for input(s): AMMONIA in the last 168 hours. CBC:  Recent Labs Lab 03/19/14 2226 03/20/14 0341  WBC 6.8 5.1  NEUTROABS 4.2  --   HGB 12.5* 12.1*  HCT 37.0* 36.7*  MCV 82.8 83.8  PLT 117* 111*   Cardiac Enzymes:  Recent Labs Lab 03/19/14 2226 03/20/14 0341 03/20/14 0835 03/20/14 1507  TROPONINI 0.61* <  0.30 <0.30 <0.30   BNP: BNP (last 3 results) No results for input(s): PROBNP in the last 8760 hours. CBG: No results for input(s): GLUCAP in the last 168 hours.     SignedNita Sells  [facility] 03/20/2014, 3:57 PM

## 2014-03-20 NOTE — Progress Notes (Signed)
Pt stated that he would like to start CPAP the night of the 11/14. Bi-level support 4-12 cmH20. No 02 requirements.

## 2014-03-20 NOTE — Progress Notes (Signed)
Pt arrived to floor via [facility]. Pt w/o any s/s of distrss. MD called and informed that pt on floor awaiting arrival for assessment of pt and orders. Will continue to mointor

## 2014-03-20 NOTE — Consult Note (Cosign Needed)
CARDIOLOGY CONSULT NOTE   Patient ID: Brent Griffith MRN: [id number], DOB/AGE: [birth year]   Admit date: 03/19/2014 Date of Consult: 03/20/2014   Primary Physician: Estill Dooms, MD Primary Cardiologist: Stanford Breed  Reason for consult: Elevated troponin  Problem List  Past Medical History  Diagnosis Date  . Unspecified essential hypertension   . Esophageal reflux   . Hypersomnia with sleep apnea, unspecified   . Asthma   . Hyperlipidemia   . CAD (coronary artery disease)   . Enlarged prostate   . Kyphosis   . Trigger finger (acquired)   . Anemia, unspecified   . Lumbago   . Senile osteoporosis   . Unspecified vitamin D deficiency   . Elevated prostate specific antigen (PSA)   . Herpes zoster without mention of complication   . Acute gastric ulcer without mention of hemorrhage, perforation, or obstruction   . Impotence of organic origin   . Allergic rhinitis, cause unspecified   . Extrinsic asthma, unspecified   . Diaphragmatic hernia without mention of obstruction or gangrene   . Blood transfusion without reported diagnosis   . Unspecified sleep apnea     CPAP- settings 4-12   . Obstructive sleep apnea (adult) (pediatric)   . Pneumonia     hx of years ago   . Arthritis     Past Surgical History  Procedure Laterality Date  . Tonsillectomy  1945  . Shoulder surgery Left 04/1998    Dr French Ana  . Mole removal  1958  . Cardiac catheterization  03/04/2009    Dr [name]  . Colonoscopy    . Coronary artery bypass graft  2010  . Transurethral resection of prostate N/A 09/24/2013    Procedure: TRANSURETHRAL RESECTION OF THE PROSTATE (TURP) WITH GYRUS (STAGED RIGHT LATERAL AND MEDIAN LOBE);  Surgeon: Ailene Rud, MD;  Location: [facility];  Service: Urology;  Laterality: N/A;  . Cystoscopy with retrograde pyelogram, ureteroscopy and stent placement Right 10/04/2013    Procedure: [place],  AND STENT PLACEMENT;  Surgeon: Festus Aloe, MD;  Location: [facility];  Service: Urology;  Laterality: Right;     Allergies  Allergies  Allergen Reactions  . Compazine [Prochlorperazine Edisylate] Anxiety    HPI  The patient is a 78M with a history of HTN, HLD, CAD s/p CABG x 4 (LIMA-LAD, VG-RI-OM2, VG-PDA) in 2010 who presented with cough. He reported worsening cough and sore throat x 4 days and had an episode of what appeared to be larygospasm earlier today, which prompted evaluation at [place] in [facility]. Specifically, he denied chest pain, shortness of breath, dizziness/LH, syncope/presyncope, palpitations, orthopnea/PND. He reports that his angina equivalent is upper back pain but he has not had that at all during this episode.  At [place] he was being treated for cough but given his history a cTn was checked and was elevated to 0.61. He was therefore transferred to [facility] for additional evaluation and management. We are consulted for his elevated cTn.  Inpatient Medications  . acidophilus  1 capsule Oral Daily  . albuterol  2.5 mg Nebulization Q6H  . aspirin  325 mg Oral Daily  . atenolol  50 mg Oral Daily  . calcium carbonate  1,250 mg Oral Q breakfast  . cholecalciferol  1,000 Units Oral Daily  . enoxaparin (LOVENOX) injection  40 mg Subcutaneous Daily  . finasteride  5 mg Oral q morning - 10a  . multivitamin with minerals  1 tablet Oral Daily  .  pantoprazole  40 mg Oral Daily  . rosuvastatin  20 mg Oral Daily  . tamsulosin  0.4 mg Oral Daily    Family History Family History  Problem Relation Age of Onset  . Stroke Mother   . Hypertension Father   . Stroke Father   . Heart disease Father   . Heart disease Paternal Grandfather   . Colon cancer Neg Hx      Social History History   Social History  . Marital Status: Married    Spouse Name: N/A    Number of Children: N/A  . Years of Education: N/A   Occupational History  . [employer]    Social History Main Topics  . Smoking  status: Never Smoker   . Smokeless tobacco: Never Used  . Alcohol Use: No  . Drug Use: No  . Sexual Activity: Not on file   Other Topics Concern  . Not on file   Social History Narrative     Review of Systems  General:  No chills, night sweats or weight changes. +subjective fever Cardiovascular:  No chest pain, dyspnea on exertion, edema, orthopnea, palpitations, paroxysmal nocturnal dyspnea. Dermatological: No rash, lesions/masses Respiratory: +cough, no dyspnea Urologic: No hematuria, dysuria Abdominal:   No nausea, vomiting, diarrhea, bright red blood per rectum, melena, or hematemesis Neurologic:  No visual changes, wkns, changes in mental status. All other systems reviewed and are otherwise negative except as noted above.  Physical Exam  Blood pressure 163/59, pulse 84, temperature 98.2 F (36.8 C), temperature source Oral, resp. rate 22, height 5\' 6"  (1.676 m), weight 153 lb 3.5 oz (69.5 kg), SpO2 94 %.  General: Pleasant, NAD Psych: Normal affect. Neuro: Alert and oriented X 3. Moves all extremities spontaneously. HEENT: Normal  Neck: Supple without bruits or JVD. Lungs:  Resp regular and unlabored, CTA. Heart: RRR no s3, s4, or murmurs. Abdomen: Soft, non-tender, non-distended, BS + x 4.  Extremities: No clubbing, cyanosis or edema. DP/PT/Radials 2+ and equal bilaterally.  Labs   Recent Labs  03/19/14 2226  TROPONINI 0.61*   Lab Results  Component Value Date   WBC 6.8 03/19/2014   HGB 12.5* 03/19/2014   HCT 37.0* 03/19/2014   MCV 82.8 03/19/2014   PLT 117* 03/19/2014    Recent Labs Lab 03/19/14 2226  NA 138  K 3.6*  CL 101  CO2 23  BUN 15  CREATININE 1.00  CALCIUM 8.8  GLUCOSE 157*   Lab Results  Component Value Date   CHOL  03/05/2009    185        ATP III CLASSIFICATION:  <200     mg/dL   Desirable  200-239  mg/dL   Borderline High  >=240    mg/dL   High          HDL 51 08/05/2013   LDLCALC 39 08/05/2013   TRIG 93 08/05/2013   No  results found for: DDIMER  Radiology/Studies  Dg Chest 2 View  03/19/2014   CLINICAL DATA:  Cough since Monday with sore throat.  EXAM: CHEST  2 VIEW  COMPARISON:  Chest radiograph 06/03/2013  FINDINGS: Changes of median sternotomy for CABG. The heart size is normal. Pulmonary vascularity is normal. Lungs are hyperinflated and there is flattening of the hemidiaphragms. No airspace disease, effusion, or pneumothorax. Patient is slightly kyphotic, but no discrete height loss of the thoracic vertebral bodies is seen.  IMPRESSION: Hyperinflation of the lungs. This could be the secondary to asthma or  COPD related to smoking. No airspace disease or other acute findings are seen.  Prior CABG.   Electronically Signed   By: Curlene Dolphin M.D.   On: 03/19/2014 20:17    ECG SR@ 90bpm, nl axis, intervals. Possible ICVD. Lateral STD.  ASSESSMENT AND PLAN The patient is a 8M with a history of HTN, HLD, CAD s/p CABG x 4 (LIMA-LAD, VG-RI-OM2, VG-PDA) in 2010 who presented with cough and elevated cTn. He reports no symptoms at all that are concerning for myocardial infarction. He has a known history of CAD and has had prior CABG. In the absence of symptoms I favor conservative management at this time.  -ASA 81 -Please check serial enzymes x 3. If his second set is elevated, then start heparin gtt -BP can be managed with ACEi, BB -Would defer P2y12 inhibitor at this time -LDL in the 30s on statin therapy -TTE in AM to evaluate for RWMA and function. He has known scar from previous SPECT on 05/20/2013   Signed, Raliegh Ip, MD MPH 03/20/2014, 3:22 AM

## 2014-03-20 NOTE — Progress Notes (Signed)
  Echocardiogram 2D Echocardiogram has been performed.  Lysle Rubens 03/20/2014, 2:14 PM

## 2014-03-20 NOTE — Progress Notes (Signed)
PROGRESS NOTE  Subjective:    78 yo hx of CAD, CABG Presented to [facility] yesterday with bronchitis - Troponin was checked and was found to be elevated.   Subsequent levels were negative. Patient has not had any acute cardiac symptoms .   Objective:    Vital Signs:   Temp:  [97.4 F (36.3 C)-98.3 F (36.8 C)] 97.4 F (36.3 C) (11/14 0800) Pulse Rate:  [68-89] 87 (11/14 1100) Resp:  [15-22] 16 (11/14 1100) BP: (104-163)/(46-79) 104/79 mmHg (11/14 1100) SpO2:  [94 %-99 %] 99 % (11/14 1100) Weight:  [153 lb 3.5 oz (69.5 kg)-155 lb (70.308 kg)] 153 lb 3.5 oz (69.5 kg) (11/14 0205)  Last BM Date: 03/20/14   24-hour weight change: Weight change:   Weight trends: Filed Weights   03/19/14 1936 03/20/14 0205  Weight: 155 lb (70.308 kg) 153 lb 3.5 oz (69.5 kg)    Intake/Output:  11/13 0701 - 11/14 0700 In: 686.3 [P.O.:480; I.V.:206.3] Out: 200 [Urine:200] Total I/O In: -  Out: 400 [Urine:400]   Physical Exam: BP 104/79 mmHg  Pulse 87  Temp(Src) 97.4 F (36.3 C) (Axillary)  Resp 16  Ht 5\' 6"  (1.676 m)  Wt 153 lb 3.5 oz (69.5 kg)  BMI 24.74 kg/m2  SpO2 99%  Wt Readings from Last 3 Encounters:  03/20/14 153 lb 3.5 oz (69.5 kg)  02/09/14 155 lb (70.308 kg)  10/14/13 154 lb 6.4 oz (70.035 kg)    General: Vital signs reviewed and noted.   Head: Normocephalic, atraumatic.  Eyes: conjunctivae/corneas clear.  EOM's intact.   Throat: normal  Neck:  normal   Lungs:    few rhonchi  Heart:  RR, normal S1S2  Abdomen:  Soft, non-tender, non-distended    Extremities: No edema   Neurologic: A&O X3, CN II - XII are grossly intact.   Psych: Normal     Labs: BMET:  Recent Labs  03/19/14 2226 03/20/14 0341  NA 138 140  K 3.6* 4.3  CL 101 102  CO2 23 25  GLUCOSE 157* 117*  BUN 15 13  CREATININE 1.00 0.98  CALCIUM 8.8 8.9    Liver function tests: No results for input(s): AST, ALT, ALKPHOS, BILITOT, PROT, ALBUMIN in the last 72 hours. No results for  input(s): LIPASE, AMYLASE in the last 72 hours.  CBC:  Recent Labs  03/19/14 2226 03/20/14 0341  WBC 6.8 5.1  NEUTROABS 4.2  --   HGB 12.5* 12.1*  HCT 37.0* 36.7*  MCV 82.8 83.8  PLT 117* 111*    Cardiac Enzymes:  Recent Labs  03/19/14 2226 03/20/14 0341 03/20/14 0835  TROPONINI 0.61* <0.30 <0.30    Coagulation Studies: No results for input(s): LABPROT, INR in the last 72 hours.    Other results:  ECG:   NSR , inc. LBBB, no acute changes.   Medications:    Infusions:    Scheduled Medications: . acidophilus  1 capsule Oral Daily  . albuterol  2.5 mg Nebulization Q6H  . aspirin  325 mg Oral Daily  . atenolol  50 mg Oral Daily  . calcium carbonate  1,250 mg Oral Q breakfast  . cholecalciferol  1,000 Units Oral Daily  . enoxaparin (LOVENOX) injection  40 mg Subcutaneous Daily  . finasteride  5 mg Oral q morning - 10a  . multivitamin with minerals  1 tablet Oral Daily  . pantoprazole  40 mg Oral Daily  . rosuvastatin  20 mg Oral Daily  . tamsulosin  0.4 mg Oral Daily    Assessment/ Plan:    1. Bronchitis:  I think this is his main issue.  No evidence that this was due to to a cardiac ischemia.   I suspect the Troponin level was falsely elevated.  All subsequent levels are negative.  Echo pending but he has not symptoms of CHF or ischemia.  I anticipate that he can go home tonight.    2.  Coronary atherosclerosis of native coronary artery: no recent angina      Acute upper respiratory infection   Disposition:  Length of Stay: 1  Thayer Headings, Brooke Bonito., MD, [facility] 03/20/2014, 12:04 PM Office [phone] Pager [phone]

## 2014-03-20 NOTE — H&P (Signed)
[facility] Admission History and Physical       Brent Griffith ZOX:[id number] DOB: 1935/11/15 DOA: 03/19/2014  Referring physician:  PCP: Estill Dooms, MD  Specialists:   Chief Complaint: SOB  HPI: Brent Griffith is a 78 y.o. male with a history of Asthma, CAD S/P CABG x 4 vessels, Hypertension, and Hyperlipidemia who presented to the [facility] [place] ED with complaints of increased Cough and Chest congestion with SOB x 4 days .  He denies having Fevers or Chills and reports coughing up clear sputum.  He reports that in the evening he felt very SOB and as if his throat was closing up.   HE reports that he was not able to breathe for about 30 seconds and he went to the [facility] ED.     In the ED he was evaluated and was found to have a chest X-ray with findings of chronic changes, and he was also found to have a positive Troponin level of 0.61.   The EDP discussed the findings with Cardiology Dr Clayborne Artist and [facility] Dr Posey Pronto and patient was transferred to [facility] for further evaluation.   Patient Denies having chest pain.          Review of Systems:  Constitutional: No Weight Loss, No Weight Gain, Night Sweats, Fevers, Chills, Dizziness, Fatigue, or Generalized Weakness HEENT: No Headaches, Difficulty Swallowing,Tooth/Dental Problems,Sore Throat,  No Sneezing, Rhinitis, Ear Ache, Nasal Congestion, or Post Nasal Drip,  Cardio-vascular:  No Chest pain, Orthopnea, PND, Edema in Lower Extremities, Anasarca, Dizziness, Palpitations  Resp: +Dyspnea, No DOE, +Productive Cough, No Hemoptysis, No Wheezing.    GI: No Heartburn, Indigestion, Abdominal Pain, Nausea, Vomiting, Diarrhea, Hematemesis, Hematochezia, Melena, Change in Bowel Habits,  Loss of Appetite  GU: No Dysuria, Change in Color of Urine, No Urgency or Frequency, No Flank pain.  Musculoskeletal: No Joint Pain or Swelling, No Decreased Range of Motion, No Back Pain.  Neurologic: No Syncope, No Seizures, Muscle Weakness, Paresthesia, Vision  Disturbance or Loss, No Diplopia, No Vertigo, No Difficulty Walking,  Skin: No Rash or Lesions. Psych: No Change in Mood or Affect, No Depression or Anxiety, No Memory loss, No Confusion, or Hallucinations   Past Medical History  Diagnosis Date  . Unspecified essential hypertension   . Esophageal reflux   . Hypersomnia with sleep apnea, unspecified   . Asthma   . Hyperlipidemia   . CAD (coronary artery disease)   . Enlarged prostate   . Kyphosis   . Trigger finger (acquired)   . Anemia, unspecified   . Lumbago   . Senile osteoporosis   . Unspecified vitamin D deficiency   . Elevated prostate specific antigen (PSA)   . Herpes zoster without mention of complication   . Acute gastric ulcer without mention of hemorrhage, perforation, or obstruction   . Impotence of organic origin   . Allergic rhinitis, cause unspecified   . Extrinsic asthma, unspecified   . Diaphragmatic hernia without mention of obstruction or gangrene   . Blood transfusion without reported diagnosis   . Unspecified sleep apnea     CPAP- settings 4-12   . Obstructive sleep apnea (adult) (pediatric)   . Pneumonia     hx of years ago   . Arthritis       Past Surgical History  Procedure Laterality Date  . Tonsillectomy  1945  . Shoulder surgery Left 04/1998    Dr French Ana  . Mole removal  1958  . Cardiac catheterization  03/04/2009  Dr [name]  . Colonoscopy    . Coronary artery bypass graft  2010  . Transurethral resection of prostate N/A 09/24/2013    Procedure: TRANSURETHRAL RESECTION OF THE PROSTATE (TURP) WITH GYRUS (STAGED RIGHT LATERAL AND MEDIAN LOBE);  Surgeon: Ailene Rud, MD;  Location: [facility] ORS;  Service: Urology;  Laterality: N/A;  . Cystoscopy with retrograde pyelogram, ureteroscopy and stent placement Right 10/04/2013    Procedure: [place],  AND STENT PLACEMENT;  Surgeon: Festus Aloe, MD;  Location: [facility] ORS;  Service: Urology;  Laterality: Right;         Prior to Admission medications   Medication Sig Start Date End Date Taking? Authorizing Provider  acetaminophen (TYLENOL) 500 MG tablet Take 1,000 mg by mouth every 6 (six) hours as needed for mild pain or moderate pain.    Historical Provider, MD  atenolol (TENORMIN) 50 MG tablet TAKE 1 TABLET BY MOUTH EVERY DAY TO CONTROL HEART RHYTHM AND LOWER BLOOD PRESSURE 01/06/14   Blanchie Serve, MD  Calcium Citrate 250 MG TABS Take 250 tablets by mouth daily.     Historical Provider, MD  cholecalciferol (VITAMIN D) 1000 UNITS tablet Take 1,000 Units by mouth daily.      Historical Provider, MD  CRESTOR 20 MG tablet TAKE 1 TABLET BY MOUTH EVERY DAY    Mahima Pandey, MD  denosumab (PROLIA) 60 MG/ML SOLN injection Inject 60 mg into the skin every 6 (six) months. Administer in upper arm, thigh, or abdomen.  Patient receives this injection every 6 months.    Historical Provider, MD  diphenhydrAMINE (BENADRYL) 25 mg capsule Take 50 mg by mouth every 6 (six) hours as needed for allergies.    Historical Provider, MD  finasteride (PROSCAR) 5 MG tablet Take 5 mg by mouth every morning.    Historical Provider, MD  Glucosamine 500 MG CAPS Take 1,000 mg by mouth daily.     Historical Provider, MD  HYDROcodone-acetaminophen (NORCO) 5-325 MG per tablet Take 1 tablet by mouth every 6 (six) hours as needed for moderate pain. 09/26/13   Reece Packer, MD  Multiple Vitamin (MULTIVITAMIN) tablet Take 1 tablet by mouth daily.      Historical Provider, MD  omeprazole (PRILOSEC) 20 MG capsule TAKE ONE CAPSULE BY MOUTH TWICE A DAY TO REDUCE STOMACH ACID AND PROTECT ESOPHAGUS    Estill Dooms, MD  Potassium 99 MG TABS Take 99 mg by mouth daily.    Historical Provider, MD  Probiotic Product (PROBIOTIC DAILY PO) Take 2 capsules by mouth daily.     Historical Provider, MD  tamsulosin (FLOMAX) 0.4 MG CAPS capsule Take 0.4 mg by mouth daily.    Historical Provider, MD  zoster vaccine live, PF, (ZOSTAVAX) 50093 UNT/0.65ML  injection Inject 19,400 Units into the skin once. 03/09/14   Blanchie Serve, MD      Allergies  Allergen Reactions  . Compazine [Prochlorperazine Edisylate] Anxiety     Social History:  reports that he has never smoked. He has never used smokeless tobacco. He reports that he does not drink alcohol or use illicit drugs.     Family History  Problem Relation Age of Onset  . Stroke Mother   . Hypertension Father   . Stroke Father   . Heart disease Father   . Heart disease Paternal Grandfather   . Colon cancer Neg Hx        Physical Exam:  GEN:  Pleasant Elderly Well Nourished and Well Developed 78 y.o. Caucasian  male examined and in no acute distress; cooperative with exam Filed Vitals:   03/20/14 0000 03/20/14 0045 03/20/14 0047 03/20/14 0205  BP:  134/72 134/72 163/59  Pulse: 68 72 72 84  Temp:    98.2 F (36.8 C)  TempSrc:    Oral  Resp: 18 16 20 22   Height:    5\' 6"  (1.676 m)  Weight:    69.5 kg (153 lb 3.5 oz)  SpO2: 96% 98% 98% 94%   Blood pressure 163/59, pulse 84, temperature 98.2 F (36.8 C), temperature source Oral, resp. rate 22, height 5\' 6"  (1.676 m), weight 69.5 kg (153 lb 3.5 oz), SpO2 94 %. PSYCH: He is alert and oriented x4; does not appear anxious does not appear depressed; affect is normal HEENT: Normocephalic and Atraumatic, Mucous membranes pink; PERRLA; EOM intact; Fundi:  Benign;  No scleral icterus, Nares: Patent, Oropharynx: Clear,  Fair Dentition,    Neck:  FROM, No Cervical Lymphadenopathy nor Thyromegaly or Carotid Bruit; No JVD; Breasts:: Not examined CHEST WALL: No tenderness CHEST: Normal respiration, clear to auscultation bilaterally HEART: Regular rate and rhythm; no murmurs rubs or gallops BACK: No kyphosis or scoliosis; No CVA tenderness ABDOMEN: Positive Bowel Sounds, Soft Non-Tender; No Masses, No Organomegaly.    Rectal Exam: Not done EXTREMITIES: No Cyanosis, Clubbing, or Edema; No Ulcerations. Genitalia: not examined PULSES: 2+  and symmetric SKIN: Normal hydration no rash or ulceration CNS:  Alert and oriented x 4 No Focal Deficits  Vascular: pulses palpable throughout    Labs on Admission:  Basic Metabolic Panel:  Recent Labs Lab 03/19/14 2226  NA 138  K 3.6*  CL 101  CO2 23  GLUCOSE 157*  BUN 15  CREATININE 1.00  CALCIUM 8.8   Liver Function Tests: No results for input(s): AST, ALT, ALKPHOS, BILITOT, PROT, ALBUMIN in the last 168 hours. No results for input(s): LIPASE, AMYLASE in the last 168 hours. No results for input(s): AMMONIA in the last 168 hours. CBC:  Recent Labs Lab 03/19/14 2226  WBC 6.8  NEUTROABS 4.2  HGB 12.5*  HCT 37.0*  MCV 82.8  PLT 117*   Cardiac Enzymes:  Recent Labs Lab 03/19/14 2226  TROPONINI 0.61*    BNP (last 3 results) No results for input(s): PROBNP in the last 8760 hours. CBG: No results for input(s): GLUCAP in the last 168 hours.  Radiological Exams on Admission: Dg Chest 2 View  03/19/2014   CLINICAL DATA:  Cough since Monday with sore throat.  EXAM: CHEST  2 VIEW  COMPARISON:  Chest radiograph 06/03/2013  FINDINGS: Changes of median sternotomy for CABG. The heart size is normal. Pulmonary vascularity is normal. Lungs are hyperinflated and there is flattening of the hemidiaphragms. No airspace disease, effusion, or pneumothorax. Patient is slightly kyphotic, but no discrete height loss of the thoracic vertebral bodies is seen.  IMPRESSION: Hyperinflation of the lungs. This could be the secondary to asthma or COPD related to smoking. No airspace disease or other acute findings are seen.  Prior CABG.   Electronically Signed   By: Curlene Dolphin M.D.   On: 03/19/2014 20:17     EKG: Independently reviewed. Normal Sinus Rhythm , S-T depression in the Lateral Leads,  Old Anterior Infarct   Assessment/Plan:   78 y.o. male with   Active Problems:   1.   NSTEMI (non-ST elevated myocardial infarction)-  Rule Out /Coronary atherosclerosis of native coronary  artery hx   +Troponin = 0.61   Cardiac Monitoring  Cycle Troponins   Cards consulted and Following   continue Atenolol   ASA Rx   Start IV Heparin if Troponin Trending Upward    2.   Shortness of breath- due to URI.      O2 PRN   PRN Benadryl PO or IV   Albuterol Nebs PRN     3.   Acute upper respiratory infection   Azithromycin PO   Tussionex BID PRN     4.   Essential hypertension   Monitor BPS   Continue      5.   Sleep apnea   CPAP qhs     6.   DVT Prophylaxis   Lovenox      Code Status:  FULL CODE   Family Communication:    No Family present Disposition Plan:   Inpatient      Time spent:  [place] [facility] Pager [phone] If [place] Please Contact the Day Rounding Team MD for [facility]  If 7PM-7AM, Please Contact Night-Floor Coverage  [email or web address] Password [email or web address] 03/20/2014, 3:10 AM

## 2014-03-20 NOTE — Plan of Care (Signed)
Problem: Phase I Progression Outcomes Goal: Voiding-avoid urinary catheter unless indicated Outcome: Completed/Met Date Met:  03/20/14     

## 2014-03-22 NOTE — Progress Notes (Signed)
Utilization review completed. Taylen Wendland, RN, BSN. 

## 2014-03-22 NOTE — Discharge Summary (Signed)
Physician Discharge Summary  CRU KRITIKOS NWG:[id number] DOB: 1936-01-08 DOA: 03/19/2014  PCP: Estill Dooms, MD  Admit date: 03/19/2014 Discharge date: 03/22/2014  Time spent: 25 minutes  Recommendations for Outpatient Follow-up:  1. Follow-up withPRIMARY care physician for further management    Discharge Diagnoses:  Active Problems:   Essential hypertension   Sleep apnea   Coronary atherosclerosis of native coronary artery   Shortness of breath   Acute upper respiratory infection   Discharge Condition: good  Diet recommendation: heart healthy  Filed Weights   03/19/14 1936 03/20/14 0205  Weight: 70.308 kg (155 lb) 69.5 kg (153 lb 3.5 oz)    History of present illness:  78 y/o ?, s/p TURP 09/24/13 [BPH]-resulting need for R urethral stent Nephrostomy, possible Barret's,  CABG x 4 2010, OSA on Bipap admitted 11/14/a.m. With cough congestion and increasing shortness of breath no fever no chills and felt as if throat was closing up. Found to have positive troponin level is 0.61 Admitted to hospital and troponin cycle cardiology consulted and started on azithromycin by mouth.echocardiogram showed no wall motion abnormalities and repeat troponins were negative patient discharged home to complete 5 days of azithromycin   Discharge Exam: Filed Vitals:   03/20/14 1534  BP:   Pulse:   Temp: 98.6 F (37 C)  Resp:     General: alert pleasant oriented Cardiovascular: S1-S2 no murmur rub or gallop Respiratory: clear  Discharge Instructions You were cared for by a hospitalist during your hospital stay. If you have any questions about your discharge medications or the care you received while you were in the hospital after you are discharged, you can call the unit and asked to speak with the hospitalist on call if the hospitalist that took care of you is not available. Once you are discharged, your primary care physician will handle any further medical issues. Please note that  NO REFILLS for any discharge medications will be authorized once you are discharged, as it is imperative that you return to your primary care physician (or establish a relationship with a primary care physician if you do not have one) for your aftercare needs so that they can reassess your need for medications and monitor your lab values.  Discharge Instructions    Diet - low sodium heart healthy    Complete by:  As directed      Discharge instructions    Complete by:  As directed   Follow up with your regular MD You did not have any concerns for a Coronary event     Increase activity slowly    Complete by:  As directed           Discharge Medication List as of 03/20/2014  5:52 PM    START taking these medications   Details  azithromycin (ZITHROMAX Z-PAK) 250 MG tablet Take 2 tablets on the first day and then 1 tablet a day for 4 days, Normal      CONTINUE these medications which have NOT CHANGED   Details  acetaminophen (TYLENOL) 500 MG tablet Take 1,000 mg by mouth every 6 (six) hours as needed for mild pain or moderate pain., Until Discontinued, Historical Med    aspirin 325 MG EC tablet Take 325 mg by mouth daily., Until Discontinued, Historical Med    atenolol (TENORMIN) 50 MG tablet TAKE 1 TABLET BY MOUTH EVERY DAY TO CONTROL HEART RHYTHM AND LOWER BLOOD PRESSURE, Normal    Calcium Citrate 250 MG TABS Take 250 tablets  by mouth daily. , Until Discontinued, Historical Med    cholecalciferol (VITAMIN D) 1000 UNITS tablet Take 1,000 Units by mouth daily.  , Until Discontinued, Historical Med    CRESTOR 20 MG tablet TAKE 1 TABLET BY MOUTH EVERY DAY, Normal    diphenhydrAMINE (BENADRYL) 25 mg capsule Take 50 mg by mouth every 6 (six) hours as needed for allergies., Until Discontinued, Historical Med    finasteride (PROSCAR) 5 MG tablet Take 5 mg by mouth every morning., Until Discontinued, Historical Med    Glucosamine 500 MG CAPS Take 1,000 mg by mouth daily. , Until  Discontinued, Historical Med    HYDROcodone-acetaminophen (NORCO) 5-325 MG per tablet Take 1 tablet by mouth every 6 (six) hours as needed for moderate pain., Starting 09/26/2013, Until Discontinued, Print    Multiple Vitamin (MULTIVITAMIN) tablet Take 1 tablet by mouth daily.  , Until Discontinued, Historical Med    omeprazole (PRILOSEC) 20 MG capsule TAKE ONE CAPSULE BY MOUTH TWICE A DAY TO REDUCE STOMACH ACID AND PROTECT ESOPHAGUS, Normal    Potassium 99 MG TABS Take 99 mg by mouth daily., Until Discontinued, Historical Med    Probiotic Product (PROBIOTIC DAILY PO) Take 2 capsules by mouth daily. , Until Discontinued, Historical Med    tamsulosin (FLOMAX) 0.4 MG CAPS capsule Take 0.4 mg by mouth daily., Until Discontinued, Historical Med    denosumab (PROLIA) 60 MG/ML SOLN injection Inject 60 mg into the skin every 6 (six) months. Administer in upper arm, thigh, or abdomen.  Patient receives this injection every 6 months., Until Discontinued, Historical Med       Allergies  Allergen Reactions  . Compazine [Prochlorperazine Edisylate] Anxiety      The results of significant diagnostics from this hospitalization (including imaging, microbiology, ancillary and laboratory) are listed below for reference.    Significant Diagnostic Studies: Dg Chest 2 View  03/19/2014   CLINICAL DATA:  Cough since Monday with sore throat.  EXAM: CHEST  2 VIEW  COMPARISON:  Chest radiograph 06/03/2013  FINDINGS: Changes of median sternotomy for CABG. The heart size is normal. Pulmonary vascularity is normal. Lungs are hyperinflated and there is flattening of the hemidiaphragms. No airspace disease, effusion, or pneumothorax. Patient is slightly kyphotic, but no discrete height loss of the thoracic vertebral bodies is seen.  IMPRESSION: Hyperinflation of the lungs. This could be the secondary to asthma or COPD related to smoking. No airspace disease or other acute findings are seen.  Prior CABG.    Electronically Signed   By: Curlene Dolphin M.D.   On: 03/19/2014 20:17    Microbiology: Recent Results (from the past 240 hour(s))  MRSA PCR Screening     Status: None   Collection Time: 03/20/14  2:26 AM  Result Value Ref Range Status   MRSA by PCR NEGATIVE NEGATIVE Final    Comment:        The GeneXpert MRSA Assay (FDA approved for NASAL specimens only), is one component of a comprehensive MRSA colonization surveillance program. It is not intended to diagnose MRSA infection nor to guide or monitor treatment for MRSA infections.      Labs: Basic Metabolic Panel:  Recent Labs Lab 03/19/14 2226 03/20/14 0341  NA 138 140  K 3.6* 4.3  CL 101 102  CO2 23 25  GLUCOSE 157* 117*  BUN 15 13  CREATININE 1.00 0.98  CALCIUM 8.8 8.9   Liver Function Tests: No results for input(s): AST, ALT, ALKPHOS, BILITOT, PROT, ALBUMIN in the last  168 hours. No results for input(s): LIPASE, AMYLASE in the last 168 hours. No results for input(s): AMMONIA in the last 168 hours. CBC:  Recent Labs Lab 03/19/14 2226 03/20/14 0341  WBC 6.8 5.1  NEUTROABS 4.2  --   HGB 12.5* 12.1*  HCT 37.0* 36.7*  MCV 82.8 83.8  PLT 117* 111*   Cardiac Enzymes:  Recent Labs Lab 03/19/14 2226 03/20/14 0341 03/20/14 0835 03/20/14 1507  TROPONINI 0.61* <0.30 <0.30 <0.30   BNP: BNP (last 3 results) No results for input(s): PROBNP in the last 8760 hours. CBG: No results for input(s): GLUCAP in the last 168 hours.     SignedNita Sells  [facility] 03/22/2014, 9:03 AM

## 2014-03-31 DIAGNOSIS — R7989 Other specified abnormal findings of blood chemistry: Secondary | ICD-10-CM

## 2014-03-31 DIAGNOSIS — R778 Other specified abnormalities of plasma proteins: Secondary | ICD-10-CM | POA: Insufficient documentation

## 2014-04-09 ENCOUNTER — Ambulatory Visit: Payer: BC Managed Care – PPO | Admitting: Internal Medicine

## 2014-04-15 ENCOUNTER — Ambulatory Visit (INDEPENDENT_AMBULATORY_CARE_PROVIDER_SITE_OTHER): Payer: BC Managed Care – PPO | Admitting: Internal Medicine

## 2014-04-15 VITALS — BP 100/70 | HR 70 | Ht 67.0 in | Wt 155.6 lb

## 2014-04-15 DIAGNOSIS — G4733 Obstructive sleep apnea (adult) (pediatric): Secondary | ICD-10-CM

## 2014-04-15 DIAGNOSIS — G4730 Sleep apnea, unspecified: Secondary | ICD-10-CM

## 2014-04-15 DIAGNOSIS — J209 Acute bronchitis, unspecified: Secondary | ICD-10-CM

## 2014-04-15 NOTE — Patient Instructions (Signed)
Order- Schedule PFT  Dx acute bronchitis  We can continue CPAP auto  [facility]  Order- DME [facility]  Download CPAP for pressure compliance             [facility]- evaluate CPAP machine for replacement of old machine  Try throat lozenges and saline nasal spray as needed for throat irritation

## 2014-04-15 NOTE — Progress Notes (Signed)
Subjective:    Patient ID: Brent Griffith, male    DOB: 05-15-1935, 78 y.o.   MRN: [id number]  HPI 12/08/10- 78 year old male never smoker, followed for obstructive sleep apnea, periodic limb movement, CAD/ CABG // Dr Donneta Romberg for allergic asthma Last here- November 10, 2009- note reviewed CPAP comfortable, used on trips and all night every night-AutoPAP [facility], full face mask. Wife likes it because it prevents snoring. Feels well rested. Usual bedtime 12MN and up around 5- he accepts responsibility to get enough sleep. Denies EDS driving or etc. No needs for changes. He gets supplies ok. We discussed mask options. Heart surgery Nov 2010 CABG 4v  03/17/12- 78 year old male never smoker, followed for obstructive sleep apnea, periodic limb movement, CAD/ CABG // Dr Donneta Romberg for allergic asthma  Wears CPAP AutoPap/ [facility] nightly 6-8hrs during week and 10-12 hours on the weekends. Denies problems with pressure and mask. Sometimes pressure is too high or too low Family tells him he will snore through very rarely.  04/09/13- 78 year old male never smoker, followed for obstructive sleep apnea, periodic limb movement, CAD/ CABG // Dr Donneta Romberg for allergic asthma  Wife here. FOLLOWS FOR:  Wear CPAP 6-8 hours per night.  Discuss getting CPAP settings change Good compliance and control, but he would like to try a lower pressure especially to start at night. Using autoPAP 8-15. Pending prostatectomy.  04/15/14-  78 year old male never smoker, followed for obstructive sleep apnea, periodic limb movement, CAD/ CABG // Dr Donneta Romberg for allergic asthma  Wife here. FOLLOWS FOR: Patient wears CPAP Auto 8-15 ( He thinks 4-12)/ [facility]  6-7 hrs each night.Patient was in hospital 1 month ago. Patient reports trouble with power button. He is having a cough with clear mucus. No sob or wheeze. Hosp November with acute bronchitis. Now occasional SOB w some cough. CXR 03/19/14 IMPRESSION: Hyperinflation of the lungs. This could be  the secondary to asthma or COPD related to smoking. No airspace disease or other acute findings are seen. Prior CABG. Electronically Signed  By: Curlene Dolphin M.D.  On: 03/19/2014 20:17  Review of Systems- see HPI Constitutional:   No-   weight loss, night sweats, fevers, chills, fatigue, lassitude. HEENT:   No-   headaches, difficulty swallowing, tooth/dental problems, sore throat,                  No-   sneezing, itching, ear ache, nasal congestion, post nasal drip,  CV:  No-   chest pain, orthopnea, PND, swelling in lower extremities, anasarca,dizziness, palpitations GI:  No-   heartburn, indigestion, abdominal pain, nausea, vomiting,  Resp: +  shortness of breath with exertion or at rest.  No-  excess mucus,             No-   productive cough,  + non-productive cough,  No-  coughing up of blood.              No-   change in color of mucus.  No- wheezing.   Skin: No-   rash or lesions. GU: . MS:  No-   joint pain or swelling. Marland Kitchen Psych:  No- change in mood or affect. No depression or anxiety.  No memory loss.    Objective:   General- Alert, Oriented, Affect-appropriate, Distress- none acute. Medium build. Skin- rash-none, lesions- none, excoriation- none Lymphadenopathy- none Head- atraumatic            Eyes- Gross vision intact, PERRLA, conjunctivae clear secretions  Ears- Hearing, canals normal            Nose- Clear, +Septal dev/ external nasal deviation,  No-mucus, polyps, erosion, perforation             Throat- Mallampati II-III , mucosa clear , drainage- none, tonsils- atrophic Neck- flexible , trachea midline, no stridor , thyroid nl, carotid no bruit Chest - symmetrical excursion , unlabored           Heart/CV- RRR , no murmur , no gallop  , no rub, nl s1 s2                           - JVD- none , edema- none, stasis changes- none, varices- none           Lung- clear to P&A, wheeze- none, cough- none , dullness-none, rub- none           Chest wall-  Abd-   Br/ Gen/ Rectal- Not done, not indicated Extrem- cyanosis- none, clubbing, none, atrophy- none, strength- nl Neuro- grossly intact to observation    Assessment & Plan:

## 2014-04-18 ENCOUNTER — Encounter: Payer: Self-pay | Admitting: Internal Medicine

## 2014-04-18 DIAGNOSIS — J209 Acute bronchitis, unspecified: Secondary | ICD-10-CM | POA: Insufficient documentation

## 2014-04-18 NOTE — Assessment & Plan Note (Signed)
Describes good compliance and control.  Plan- download for documentation and to check pressures on record at [facility]

## 2014-04-18 NOTE — Assessment & Plan Note (Signed)
Recent hosp for acute bronchitis, now near baseline after Z pak.

## 2014-04-21 ENCOUNTER — Ambulatory Visit (INDEPENDENT_AMBULATORY_CARE_PROVIDER_SITE_OTHER): Payer: BC Managed Care – PPO | Admitting: Cardiology

## 2014-04-21 ENCOUNTER — Encounter: Payer: Self-pay | Admitting: Cardiology

## 2014-04-21 VITALS — BP 118/68 | HR 61 | Ht 67.0 in | Wt 156.0 lb

## 2014-04-21 DIAGNOSIS — I10 Essential (primary) hypertension: Secondary | ICD-10-CM

## 2014-04-21 DIAGNOSIS — I2510 Atherosclerotic heart disease of native coronary artery without angina pectoris: Secondary | ICD-10-CM

## 2014-04-21 DIAGNOSIS — E7800 Pure hypercholesterolemia, unspecified: Secondary | ICD-10-CM

## 2014-04-21 NOTE — Assessment & Plan Note (Signed)
Blood pressure controlled. Continue present medications. 

## 2014-04-21 NOTE — Progress Notes (Signed)
HPI: FU coronary artery disease. An echocardiogram in 2010 showed an ejection fraction of 50-55% with apical and septal hypokinesis. A Myoview was therefore performed and showed severe anterior ischemia and an ejection fraction of 47%. He therefore underwent cardiac catheterization on March 04, 2009 and this revealed severe three-vessel coronary disease as well as an 80% left main. His ejection fraction was 50%. He ultimately underwent coronary artery bypass and graft on November 1 (left internal mammary artery to left anterior descending, sequential saphenous vein graft to ramus intermedius and obtuse marginal 2, saphenous vein graft to posterior descending. Nuclear study January 2015 showed an ejection fraction of 50%. There is prior anterior, lateral and inferior infarct with mild peri-infarct ischemia. Treated medically. Admitted in November with bronchitis. Troponin mildly elevated but fu values normal; treated medically. Echocardiogram November 2015 showed normal LV function, grade 1 diastolic dysfunction and moderate left atrial enlargement. Since he was last seen, the patient has dyspnea with more extreme activities but not with routine activities. It is relieved with rest. It is not associated with chest pain. There is no orthopnea, PND or pedal edema. There is no syncope or palpitations. There is no exertional chest pain.   Current Outpatient Prescriptions  Medication Sig Dispense Refill  . acetaminophen (TYLENOL) 500 MG tablet Take 1,000 mg by mouth every 6 (six) hours as needed for mild pain or moderate pain.    Marland Kitchen aspirin 325 MG EC tablet Take 325 mg by mouth daily.    Marland Kitchen atenolol (TENORMIN) 50 MG tablet TAKE 1 TABLET BY MOUTH EVERY DAY TO CONTROL HEART RHYTHM AND LOWER BLOOD PRESSURE 90 tablet 3  . Calcium Citrate 250 MG TABS Take 250 tablets by mouth daily.     . cholecalciferol (VITAMIN D) 1000 UNITS tablet Take 1,000 Units by mouth daily.      . CRESTOR 20 MG tablet TAKE 1 TABLET  BY MOUTH EVERY DAY 90 tablet 3  . denosumab (PROLIA) 60 MG/ML SOLN injection Inject 60 mg into the skin every 6 (six) months. Administer in upper arm, thigh, or abdomen.  Patient receives this injection every 6 months.    . diphenhydrAMINE (BENADRYL) 25 mg capsule Take 50 mg by mouth every 6 (six) hours as needed for allergies.    . finasteride (PROSCAR) 5 MG tablet Take 5 mg by mouth every morning.    Marland Kitchen FLUVIRIN SUSP Inject 0.5 mLs into the muscle once.  0  . Glucosamine 500 MG CAPS Take 1,000 mg by mouth daily.     Marland Kitchen HYDROcodone-acetaminophen (NORCO) 5-325 MG per tablet Take 1 tablet by mouth every 6 (six) hours as needed for moderate pain. 30 tablet 0  . Multiple Vitamin (MULTIVITAMIN) tablet Take 1 tablet by mouth daily.      Marland Kitchen omeprazole (PRILOSEC) 20 MG capsule TAKE ONE CAPSULE BY MOUTH TWICE A DAY TO REDUCE STOMACH ACID AND PROTECT ESOPHAGUS 180 capsule 1  . Potassium 99 MG TABS Take 99 mg by mouth daily.    . Probiotic Product (PROBIOTIC DAILY PO) Take 2 capsules by mouth daily.      No current facility-administered medications for this visit.     Past Medical History  Diagnosis Date  . Unspecified essential hypertension   . Esophageal reflux   . Hypersomnia with sleep apnea, unspecified   . Asthma   . Hyperlipidemia   . CAD (coronary artery disease)   . Enlarged prostate   . Kyphosis   . Trigger finger (acquired)   .  Anemia, unspecified   . Lumbago   . Senile osteoporosis   . Unspecified vitamin D deficiency   . Elevated prostate specific antigen (PSA)   . Herpes zoster without mention of complication   . Acute gastric ulcer without mention of hemorrhage, perforation, or obstruction   . Impotence of organic origin   . Allergic rhinitis, cause unspecified   . Extrinsic asthma, unspecified   . Diaphragmatic hernia without mention of obstruction or gangrene   . Blood transfusion without reported diagnosis   . Unspecified sleep apnea     CPAP- settings 4-12   .  Obstructive sleep apnea (adult) (pediatric)   . Pneumonia     hx of years ago   . Arthritis     Past Surgical History  Procedure Laterality Date  . Tonsillectomy  1945  . Shoulder surgery Left 04/1998    Dr French Ana  . Mole removal  1958  . Cardiac catheterization  03/04/2009    Dr [name]  . Colonoscopy    . Coronary artery bypass graft  2010  . Transurethral resection of prostate N/A 09/24/2013    Procedure: TRANSURETHRAL RESECTION OF THE PROSTATE (TURP) WITH GYRUS (STAGED RIGHT LATERAL AND MEDIAN LOBE);  Surgeon: Ailene Rud, MD;  Location: [facility];  Service: Urology;  Laterality: N/A;  . Cystoscopy with retrograde pyelogram, ureteroscopy and stent placement Right 10/04/2013    Procedure: [place],  AND STENT PLACEMENT;  Surgeon: Festus Aloe, MD;  Location: [facility];  Service: Urology;  Laterality: Right;    History   Social History  . Marital Status: Married    Spouse Name: N/A    Number of Children: N/A  . Years of Education: N/A   Occupational History  . [employer]    Social History Main Topics  . Smoking status: Never Smoker   . Smokeless tobacco: Never Used  . Alcohol Use: No  . Drug Use: No  . Sexual Activity: Not on file   Other Topics Concern  . Not on file   Social History Narrative    ROS: nonproductive cough butno fevers or chills, productive cough, hemoptysis, dysphasia, odynophagia, melena, hematochezia, dysuria, hematuria, rash, seizure activity, orthopnea, PND, pedal edema, claudication. Remaining systems are negative.  Physical Exam: Well-developed well-nourished in no acute distress.  Skin is warm and dry.  HEENT is normal.  Neck is supple.  Chest is clear to auscultation with normal expansion.  Cardiovascular exam is regular rate and rhythm.  Abdominal exam nontender or distended. No masses palpated. Extremities show no edema. neuro grossly intact  ECG 03/19/2014-sinus rhythm,  cannot rule out prior septal infarct, lateral T-wave inversion.

## 2014-04-21 NOTE — Patient Instructions (Signed)
Your physician wants you to follow-up in: ONE YEAR WITH DR CRENSHAW You will receive a reminder letter in the mail two months in advance. If you don't receive a letter, please call our office to schedule the follow-up appointment.  

## 2014-04-21 NOTE — Assessment & Plan Note (Signed)
Continue statin. 

## 2014-04-21 NOTE — Assessment & Plan Note (Signed)
Continue aspirin and statin. Last nuclear study low risk. Recent admission with dyspnea and minimally abnormal troponin. However follow-up troponins were normal leading me to believe the mildly abnormal value was a lab error. He has had no chest pain. Plan medical therapy.

## 2014-05-04 ENCOUNTER — Ambulatory Visit (INDEPENDENT_AMBULATORY_CARE_PROVIDER_SITE_OTHER): Payer: BC Managed Care – PPO | Admitting: Internal Medicine

## 2014-05-04 DIAGNOSIS — J209 Acute bronchitis, unspecified: Secondary | ICD-10-CM

## 2014-05-04 LAB — PULMONARY FUNCTION TEST
DL/VA % PRED: 61 %
DL/VA: 2.61 ml/min/mmHg/L
DLCO UNC % PRED: 59 %
DLCO unc: 15.14 ml/min/mmHg
FEF 25-75 POST: 2.64 L/s
FEF 25-75 Pre: 2.35 L/sec
FEF2575-%CHANGE-POST: 12 %
FEF2575-%PRED-POST: 155 %
FEF2575-%Pred-Pre: 138 %
FEV1-%CHANGE-POST: 2 %
FEV1-%PRED-PRE: 104 %
FEV1-%Pred-Post: 106 %
FEV1-POST: 2.54 L
FEV1-Pre: 2.49 L
FEV1FVC-%CHANGE-POST: 1 %
FEV1FVC-%Pred-Pre: 109 %
FEV6-%Change-Post: 1 %
FEV6-%Pred-Post: 100 %
FEV6-%Pred-Pre: 99 %
FEV6-POST: 3.13 L
FEV6-Pre: 3.09 L
FEV6FVC-%CHANGE-POST: 0 %
FEV6FVC-%PRED-PRE: 105 %
FEV6FVC-%Pred-Post: 106 %
FVC-%CHANGE-POST: 0 %
FVC-%PRED-PRE: 93 %
FVC-%Pred-Post: 93 %
FVC-Post: 3.16 L
FVC-Pre: 3.15 L
POST FEV1/FVC RATIO: 80 %
PRE FEV6/FVC RATIO: 98 %
Post FEV6/FVC ratio: 99 %
Pre FEV1/FVC ratio: 79 %
RV % pred: 147 %
RV: 3.4 L
TLC % pred: 111 %
TLC: 6.71 L

## 2014-05-04 NOTE — Progress Notes (Signed)
PFT done. Favor Kreh, CMA  

## 2014-05-10 ENCOUNTER — Telehealth: Payer: Self-pay | Admitting: Cardiology

## 2014-05-10 NOTE — Telephone Encounter (Signed)
Spoke with pt wife, aware okay to decrease aspirin to 81 mg daily. Patient does not need SBE.

## 2014-05-10 NOTE — Telephone Encounter (Signed)
New message    1. What dental office are you calling from? No - patient wife   2. What is your office phone and fax number? [facility] Cota in [place] - oral surgeon [phone]  / wife spoke with Sherri   3. What type of procedure is the patient having performed? Tooth extraction   4. What date is procedure scheduled? Pending   5. What is your question (ex. Antibiotics prior to procedure, holding medication-we need to know how long dentist wants pt to hold med)? Need to switch over to  81 mg of asa - stop taken 325 mg asa.

## 2014-05-19 ENCOUNTER — Other Ambulatory Visit: Payer: Self-pay | Admitting: *Deleted

## 2014-05-19 MED ORDER — ROSUVASTATIN CALCIUM 20 MG PO TABS: ORAL_TABLET | ORAL | Status: DC

## 2014-05-19 NOTE — Telephone Encounter (Signed)
Patient wife requested because they lost their previous Rx.

## 2014-06-07 ENCOUNTER — Other Ambulatory Visit: Payer: BLUE CROSS/BLUE SHIELD

## 2014-06-07 DIAGNOSIS — I10 Essential (primary) hypertension: Secondary | ICD-10-CM

## 2014-06-07 DIAGNOSIS — E785 Hyperlipidemia, unspecified: Secondary | ICD-10-CM

## 2014-06-08 LAB — COMPREHENSIVE METABOLIC PANEL
ALT: 22 IU/L (ref 0–44)
AST: 25 IU/L (ref 0–40)
Albumin/Globulin Ratio: 1.7 (ref 1.1–2.5)
Albumin: 4 g/dL (ref 3.5–4.8)
Alkaline Phosphatase: 46 IU/L (ref 39–117)
BUN/Creatinine Ratio: 13 (ref 10–22)
BUN: 12 mg/dL (ref 8–27)
CHLORIDE: 104 mmol/L (ref 97–108)
CO2: 22 mmol/L (ref 18–29)
Calcium: 8.8 mg/dL (ref 8.6–10.2)
Creatinine, Ser: 0.91 mg/dL (ref 0.76–1.27)
GFR calc Af Amer: 94 mL/min/{1.73_m2} (ref >59)
GFR calc non Af Amer: 82 mL/min/{1.73_m2} (ref >59)
GLUCOSE: 110 mg/dL — AB (ref 65–99)
Globulin, Total: 2.3 g/dL (ref 1.5–4.5)
POTASSIUM: 4.1 mmol/L (ref 3.5–5.2)
SODIUM: 140 mmol/L (ref 134–144)
Total Bilirubin: 1.2 mg/dL (ref 0.0–1.2)
Total Protein: 6.3 g/dL (ref 6.0–8.5)

## 2014-06-08 LAB — LIPID PANEL
Chol/HDL Ratio: 2.1 ratio units (ref 0.0–5.0)
Cholesterol, Total: 120 mg/dL (ref 100–199)
HDL: 56 mg/dL (ref >39)
LDL CALC: 49 mg/dL (ref 0–99)
TRIGLYCERIDES: 75 mg/dL (ref 0–149)
VLDL Cholesterol Cal: 15 mg/dL (ref 5–40)

## 2014-06-09 ENCOUNTER — Ambulatory Visit (INDEPENDENT_AMBULATORY_CARE_PROVIDER_SITE_OTHER): Payer: BLUE CROSS/BLUE SHIELD | Admitting: Internal Medicine

## 2014-06-09 ENCOUNTER — Encounter: Payer: Self-pay | Admitting: Internal Medicine

## 2014-06-09 VITALS — BP 120/76 | HR 59 | Temp 98.1°F | Resp 20 | Ht 67.0 in | Wt 152.0 lb

## 2014-06-09 DIAGNOSIS — I10 Essential (primary) hypertension: Secondary | ICD-10-CM

## 2014-06-09 DIAGNOSIS — E7800 Pure hypercholesterolemia, unspecified: Secondary | ICD-10-CM

## 2014-06-09 DIAGNOSIS — G4733 Obstructive sleep apnea (adult) (pediatric): Secondary | ICD-10-CM

## 2014-06-09 DIAGNOSIS — I2510 Atherosclerotic heart disease of native coronary artery without angina pectoris: Secondary | ICD-10-CM

## 2014-06-09 DIAGNOSIS — N400 Enlarged prostate without lower urinary tract symptoms: Secondary | ICD-10-CM

## 2014-06-09 DIAGNOSIS — M810 Age-related osteoporosis without current pathological fracture: Secondary | ICD-10-CM

## 2014-06-09 DIAGNOSIS — K219 Gastro-esophageal reflux disease without esophagitis: Secondary | ICD-10-CM

## 2014-06-09 DIAGNOSIS — G4730 Sleep apnea, unspecified: Secondary | ICD-10-CM

## 2014-06-09 DIAGNOSIS — G4710 Hypersomnia, unspecified: Secondary | ICD-10-CM

## 2014-06-09 MED ORDER — OMEPRAZOLE 20 MG PO CPDR: DELAYED_RELEASE_CAPSULE | ORAL | Status: DC

## 2014-06-09 NOTE — Progress Notes (Signed)
Patient ID: Brent Griffith, male   DOB: 01/27/1936, 79 y.o.   MRN: [id number]    Facility  [facility]    Place of Service:   OFFICE   Allergies  Allergen Reactions  . Compazine [Prochlorperazine Edisylate] Anxiety    Chief Complaint  Patient presents with  . Medical Management of Chronic Issues    HPI:  Essential hypertension: Controlled. He is going to try dropping off Cardura  Pure hypercholesterolemia -controlled  Hypersomnia with sleep apnea -using CPAP  Obstructive sleep apnea -using CPAP  Atherosclerosis of native coronary artery of native heart without angina pectoris -stable. No recent angina or palpitations  Benign hypertrophy of prostate -patient has had surgery and feels that he is voiding much better previous hydronephrosis of the kidney has resolved.  Senile osteoporosis -continues on Prolia  Gastroesophageal reflux disease without esophagitis -asymptomatic    Medications: Patient's Medications  New Prescriptions   No medications on file  Previous Medications   ACETAMINOPHEN (TYLENOL) 500 MG TABLET    Take 1,000 mg by mouth every 6 (six) hours as needed for mild pain or moderate pain.   ASPIRIN 325 MG EC TABLET    Take 325 mg by mouth daily.   ATENOLOL (TENORMIN) 50 MG TABLET    TAKE 1 TABLET BY MOUTH EVERY DAY TO CONTROL HEART RHYTHM AND LOWER BLOOD PRESSURE   CALCIUM CITRATE 250 MG TABS    Take 250 tablets by mouth daily.    CHOLECALCIFEROL (VITAMIN D) 1000 UNITS TABLET    Take 1,000 Units by mouth daily.     DENOSUMAB (PROLIA) 60 MG/ML SOLN INJECTION    Inject 60 mg into the skin every 6 (six) months. Administer in upper arm, thigh, or abdomen.  Patient receives this injection every 6 months.   DIPHENHYDRAMINE (BENADRYL) 25 MG CAPSULE    Take 50 mg by mouth every 6 (six) hours as needed for allergies.   FINASTERIDE (PROSCAR) 5 MG TABLET    Take 5 mg by mouth every morning.   FLUVIRIN SUSP    Inject 0.5 mLs into the muscle once.   GLUCOSAMINE 500 MG CAPS     Take 1,000 mg by mouth daily.    HYDROCODONE-ACETAMINOPHEN (NORCO) 5-325 MG PER TABLET    Take 1 tablet by mouth every 6 (six) hours as needed for moderate pain.   HYDROCODONE-ACETAMINOPHEN 5-300 MG TABS    Take 1 tablet by mouth every 4 (four) hours as needed. for pain   MULTIPLE VITAMIN (MULTIVITAMIN) TABLET    Take 1 tablet by mouth daily.     OMEPRAZOLE (PRILOSEC) 20 MG CAPSULE    TAKE ONE CAPSULE BY MOUTH TWICE A DAY TO REDUCE STOMACH ACID AND PROTECT ESOPHAGUS   POTASSIUM 99 MG TABS    Take 99 mg by mouth daily.   PROBIOTIC PRODUCT (PROBIOTIC DAILY PO)    Take 2 capsules by mouth daily.    ROSUVASTATIN (CRESTOR) 20 MG TABLET    Take one tablet by mouth once daily for cholesterol  Modified Medications   No medications on file  Discontinued Medications   No medications on file     Review of Systems  Constitutional: Negative.   HENT: Negative.   Eyes: Negative.   Respiratory: Negative.        Wears CPAP. Followed by Dr. Young.  Cardiovascular: Negative for chest pain, palpitations and leg swelling.  Gastrointestinal: Negative.   Endocrine: Negative.   Musculoskeletal: Negative.   Skin: Negative.   Allergic/Immunologic: Negative.     Neurological: Negative.   Hematological: Negative.   Psychiatric/Behavioral: Negative.     Filed Vitals:   06/09/14 1223  BP: 120/76  Pulse: 59  Temp: 98.1 F (36.7 C)  TempSrc: Oral  Resp: 20  Height: 5' 7" (1.702 m)  Weight: 152 lb (68.947 kg)  SpO2: 99%   Body mass index is 23.8 kg/(m^2).  Physical Exam  Constitutional: He is oriented to person, place, and time. He appears well-developed and well-nourished. No distress.  HENT:  Head: Atraumatic.  Right Ear: External ear normal.  Left Ear: External ear normal.  Nose: Nose normal.  Eyes: Conjunctivae and EOM are normal. Pupils are equal, round, and reactive to light.  Neck: Normal range of motion. Neck supple. No JVD present. No tracheal deviation present. No thyromegaly present.    Cardiovascular: Normal rate, regular rhythm, normal heart sounds and intact distal pulses.  Exam reveals no gallop and no friction rub.   No murmur heard. Pulmonary/Chest: Effort normal and breath sounds normal. No respiratory distress. He has no wheezes. He has no rales. He exhibits no tenderness.  Abdominal: Soft. Bowel sounds are normal. He exhibits no distension and no mass. There is no tenderness. There is no rebound.  Musculoskeletal: Normal range of motion. He exhibits no edema or tenderness.  Lymphadenopathy:    He has no cervical adenopathy.  Neurological: He is alert and oriented to person, place, and time. He has normal reflexes. No cranial nerve deficit. Coordination normal.  Skin: Skin is warm and dry. No rash noted. No erythema. No pallor.  Psychiatric: He has a normal mood and affect. His behavior is normal. Judgment and thought content normal.     Labs reviewed: Appointment on 06/07/2014  Component Date Value Ref Range Status  . Cholesterol, Total 06/07/2014 120  100 - 199 mg/dL Final  . Triglycerides 06/07/2014 75  0 - 149 mg/dL Final  . HDL 06/07/2014 56  >39 mg/dL Final   Comment: According to ATP-III Guidelines, HDL-C >59 mg/dL is considered a negative risk factor for CHD.   Marland Kitchen VLDL Cholesterol Cal 06/07/2014 15  5 - 40 mg/dL Final  . LDL Calculated 06/07/2014 49  0 - 99 mg/dL Final  . Chol/HDL Ratio 06/07/2014 2.1  0.0 - 5.0 ratio units Final   Comment:                                   T. Chol/HDL Ratio                                             Men  Women                               1/2 Avg.Risk  3.4    3.3                                   Avg.Risk  5.0    4.4                                2X Avg.Risk  9.6    7.1  3X Avg.Risk 23.4   11.0   . Glucose 06/07/2014 110* 65 - 99 mg/dL Final  . BUN 06/07/2014 12  8 - 27 mg/dL Final  . Creatinine, Ser 06/07/2014 0.91  0.76 - 1.27 mg/dL Final  . GFR calc non Af Amer 06/07/2014 82   >59 mL/min/1.73 Final  . GFR calc Af Amer 06/07/2014 94  >59 mL/min/1.73 Final  . BUN/Creatinine Ratio 06/07/2014 13  10 - 22 Final  . Sodium 06/07/2014 140  134 - 144 mmol/L Final  . Potassium 06/07/2014 4.1  3.5 - 5.2 mmol/L Final  . Chloride 06/07/2014 104  97 - 108 mmol/L Final  . CO2 06/07/2014 22  18 - 29 mmol/L Final  . Calcium 06/07/2014 8.8  8.6 - 10.2 mg/dL Final  . Total Protein 06/07/2014 6.3  6.0 - 8.5 g/dL Final  . Albumin 06/07/2014 4.0  3.5 - 4.8 g/dL Final  . Globulin, Total 06/07/2014 2.3  1.5 - 4.5 g/dL Final  . Albumin/Globulin Ratio 06/07/2014 1.7  1.1 - 2.5 Final  . Total Bilirubin 06/07/2014 1.2  0.0 - 1.2 mg/dL Final  . Alkaline Phosphatase 06/07/2014 46  39 - 117 IU/L Final  . AST 06/07/2014 25  0 - 40 IU/L Final  . ALT 06/07/2014 22  0 - 44 IU/L Final  Clinical Support on 05/04/2014  Component Date Value Ref Range Status  . FVC-Pre 05/04/2014 3.15   Final  . FVC-%Pred-Pre 05/04/2014 93   Final  . FVC-Post 05/04/2014 3.16   Final  . FVC-%Pred-Post 05/04/2014 93   Final  . FVC-%Change-Post 05/04/2014 0   Final  . FEV1-Pre 05/04/2014 2.49   Final  . FEV1-%Pred-Pre 05/04/2014 104   Final  . FEV1-Post 05/04/2014 2.54   Final  . FEV1-%Pred-Post 05/04/2014 106   Final  . FEV1-%Change-Post 05/04/2014 2   Final  . FEV6-Pre 05/04/2014 3.09   Final  . FEV6-%Pred-Pre 05/04/2014 99   Final  . FEV6-Post 05/04/2014 3.13   Final  . FEV6-%Pred-Post 05/04/2014 100   Final  . FEV6-%Change-Post 05/04/2014 1   Final  . Pre FEV1/FVC ratio 05/04/2014 79   Final  . FEV1FVC-%Pred-Pre 05/04/2014 109   Final  . Post FEV1/FVC ratio 05/04/2014 80   Final  . FEV1FVC-%Change-Post 05/04/2014 1   Final  . Pre FEV6/FVC Ratio 05/04/2014 98   Final  . FEV6FVC-%Pred-Pre 05/04/2014 105   Final  . Post FEV6/FVC ratio 05/04/2014 99   Final  . FEV6FVC-%Pred-Post 05/04/2014 106   Final  . FEV6FVC-%Change-Post 05/04/2014 0   Final  . FEF 25-75 Pre 05/04/2014 2.35   Final  .  FEF2575-%Pred-Pre 05/04/2014 138   Final  . FEF 25-75 Post 05/04/2014 2.64   Final  . FEF2575-%Pred-Post 05/04/2014 155   Final  . FEF2575-%Change-Post 05/04/2014 12   Final  . RV 05/04/2014 3.40   Final  . RV % pred 05/04/2014 147   Final  . TLC 05/04/2014 6.71   Final  . TLC % pred 05/04/2014 111   Final  . DLCO unc 05/04/2014 15.14   Final  . DLCO unc % pred 05/04/2014 59   Final  . DL/VA 05/04/2014 2.61   Final  . DL/VA % pred 05/04/2014 61   Final  Admission on 03/19/2014, Discharged on 03/20/2014  Component Date Value Ref Range Status  . WBC 03/19/2014 6.8  4.0 - 10.5 K/uL Final  . RBC 03/19/2014 4.47  4.22 - 5.81 MIL/uL Final  . Hemoglobin 03/19/2014 12.5* 13.0 - 17.0 g/dL  Final  . HCT 03/19/2014 37.0* 39.0 - 52.0 % Final  . MCV 03/19/2014 82.8  78.0 - 100.0 fL Final  . MCH 03/19/2014 28.0  26.0 - 34.0 pg Final  . MCHC 03/19/2014 33.8  30.0 - 36.0 g/dL Final  . RDW 03/19/2014 15.3  11.5 - 15.5 % Final  . Platelets 03/19/2014 117* 150 - 400 K/uL Final   Comment: PLATELET COUNT CONFIRMED BY SMEAR REPEATED TO VERIFY   . Neutrophils Relative % 03/19/2014 62  43 - 77 % Final  . Neutro Abs 03/19/2014 4.2  1.7 - 7.7 K/uL Final  . Lymphocytes Relative 03/19/2014 24  12 - 46 % Final  . Lymphs Abs 03/19/2014 1.7  0.7 - 4.0 K/uL Final  . Monocytes Relative 03/19/2014 9  3 - 12 % Final  . Monocytes Absolute 03/19/2014 0.6  0.1 - 1.0 K/uL Final  . Eosinophils Relative 03/19/2014 5  0 - 5 % Final  . Eosinophils Absolute 03/19/2014 0.4  0.0 - 0.7 K/uL Final  . Basophils Relative 03/19/2014 0  0 - 1 % Final  . Basophils Absolute 03/19/2014 0.0  0.0 - 0.1 K/uL Final  . Sodium 03/19/2014 138  137 - 147 mEq/L Final  . Potassium 03/19/2014 3.6* 3.7 - 5.3 mEq/L Final  . Chloride 03/19/2014 101  96 - 112 mEq/L Final  . CO2 03/19/2014 23  19 - 32 mEq/L Final  . Glucose, Bld 03/19/2014 157* 70 - 99 mg/dL Final  . BUN 03/19/2014 15  6 - 23 mg/dL Final  . Creatinine, Ser 03/19/2014 1.00  0.50  - 1.35 mg/dL Final  . Calcium 03/19/2014 8.8  8.4 - 10.5 mg/dL Final  . GFR calc non Af Amer 03/19/2014 71* >90 mL/min Final  . GFR calc Af Amer 03/19/2014 82* >90 mL/min Final   Comment: (NOTE) The eGFR has been calculated using the CKD EPI equation. This calculation has not been validated in all clinical situations. eGFR's persistently <90 mL/min signify possible Chronic Kidney Disease.   . Anion gap 03/19/2014 14  5 - 15 Final  . Troponin I 03/19/2014 0.61* <0.30 ng/mL Final   Comment:        Due to the release kinetics of cTnI, a negative result within the first hours of the onset of symptoms does not rule out myocardial infarction with certainty. If myocardial infarction is still suspected, repeat the test at appropriate intervals. CRITICAL RESULT CALLED TO, READ BACK BY AND VERIFIED WITH: ROBERTS,M AT [id number] ON [id number] BY CHERESNOWSKY,T   . MRSA by PCR 03/20/2014 NEGATIVE  NEGATIVE Final   Comment:        The GeneXpert MRSA Assay (FDA approved for NASAL specimens only), is one component of a comprehensive MRSA colonization surveillance program. It is not intended to diagnose MRSA infection nor to guide or monitor treatment for MRSA infections.   . Troponin I 03/20/2014 <0.30  <0.30 ng/mL Final   Comment:        Due to the release kinetics of cTnI, a negative result within the first hours of the onset of symptoms does not rule out myocardial infarction with certainty. If myocardial infarction is still suspected, repeat the test at appropriate intervals.   . Troponin I 03/20/2014 <0.30  <0.30 ng/mL Final   Comment:        Due to the release kinetics of cTnI, a negative result within the first hours of the onset of symptoms does not rule out myocardial infarction with certainty. If myocardial infarction is  still suspected, repeat the test at appropriate intervals.   . Troponin I 03/20/2014 <0.30  <0.30 ng/mL Final   Comment:        Due to the release kinetics of  cTnI, a negative result within the first hours of the onset of symptoms does not rule out myocardial infarction with certainty. If myocardial infarction is still suspected, repeat the test at appropriate intervals.   . Sodium 03/20/2014 140  137 - 147 mEq/L Final  . Potassium 03/20/2014 4.3  3.7 - 5.3 mEq/L Final  . Chloride 03/20/2014 102  96 - 112 mEq/L Final  . CO2 03/20/2014 25  19 - 32 mEq/L Final  . Glucose, Bld 03/20/2014 117* 70 - 99 mg/dL Final  . BUN 03/20/2014 13  6 - 23 mg/dL Final  . Creatinine, Ser 03/20/2014 0.98  0.50 - 1.35 mg/dL Final  . Calcium 03/20/2014 8.9  8.4 - 10.5 mg/dL Final  . GFR calc non Af Amer 03/20/2014 78* >90 mL/min Final  . GFR calc Af Amer 03/20/2014 >90  >90 mL/min Final   Comment: (NOTE) The eGFR has been calculated using the CKD EPI equation. This calculation has not been validated in all clinical situations. eGFR's persistently <90 mL/min signify possible Chronic Kidney Disease.   . Anion gap 03/20/2014 13  5 - 15 Final  . WBC 03/20/2014 5.1  4.0 - 10.5 K/uL Final  . RBC 03/20/2014 4.38  4.22 - 5.81 MIL/uL Final  . Hemoglobin 03/20/2014 12.1* 13.0 - 17.0 g/dL Final  . HCT 03/20/2014 36.7* 39.0 - 52.0 % Final  . MCV 03/20/2014 83.8  78.0 - 100.0 fL Final  . MCH 03/20/2014 27.6  26.0 - 34.0 pg Final  . MCHC 03/20/2014 33.0  30.0 - 36.0 g/dL Final  . RDW 03/20/2014 15.4  11.5 - 15.5 % Final  . Platelets 03/20/2014 111* 150 - 400 K/uL Final   Comment: REPEATED TO VERIFY PLATELET COUNT CONFIRMED BY SMEAR      Assessment/Plan  1. Essential hypertension Controlled. Discontinue Cardura - Basic metabolic panel; Future  2. Pure hypercholesterolemia Controlled - Lipid panel; Future  3. Hypersomnia with sleep apnea Continue CPAP  4. Obstructive sleep apnea Continue CPAP  5. Atherosclerosis of native coronary artery of native heart without angina pectoris Stable  6. Benign hypertrophy of prostate Discontinue Cardura  7. Senile  osteoporosis Continue Prolia  8. Gastroesophageal reflux disease without esophagitis - omeprazole (PRILOSEC) 20 MG capsule; TAKE ONE CAPSULE BY MOUTH DAILY TO REDUCE STOMACH ACID AND PROTECT ESOPHAGUS  Dispense: 90 capsule; Refill: 1   

## 2014-06-16 ENCOUNTER — Other Ambulatory Visit: Payer: Self-pay | Admitting: Internal Medicine

## 2014-06-26 ENCOUNTER — Emergency Department (HOSPITAL_BASED_OUTPATIENT_CLINIC_OR_DEPARTMENT_OTHER): Payer: BLUE CROSS/BLUE SHIELD

## 2014-06-26 ENCOUNTER — Emergency Department (HOSPITAL_BASED_OUTPATIENT_CLINIC_OR_DEPARTMENT_OTHER)
Admission: EM | Admit: 2014-06-26 | Discharge: 2014-06-27 | Disposition: A | Discharge status: Home / Self Care | Payer: BLUE CROSS/BLUE SHIELD | Attending: Emergency Medicine | Admitting: Emergency Medicine

## 2014-06-26 ENCOUNTER — Encounter (HOSPITAL_BASED_OUTPATIENT_CLINIC_OR_DEPARTMENT_OTHER): Payer: Self-pay | Admitting: Emergency Medicine

## 2014-06-26 DIAGNOSIS — Z862 Personal history of diseases of the blood and blood-forming organs and certain disorders involving the immune mechanism: Secondary | ICD-10-CM | POA: Insufficient documentation

## 2014-06-26 DIAGNOSIS — I10 Essential (primary) hypertension: Secondary | ICD-10-CM | POA: Diagnosis not present

## 2014-06-26 DIAGNOSIS — I2510 Atherosclerotic heart disease of native coronary artery without angina pectoris: Secondary | ICD-10-CM | POA: Insufficient documentation

## 2014-06-26 DIAGNOSIS — Z8701 Personal history of pneumonia (recurrent): Secondary | ICD-10-CM | POA: Insufficient documentation

## 2014-06-26 DIAGNOSIS — Z7982 Long term (current) use of aspirin: Secondary | ICD-10-CM | POA: Diagnosis not present

## 2014-06-26 DIAGNOSIS — R0602 Shortness of breath: Secondary | ICD-10-CM | POA: Diagnosis present

## 2014-06-26 DIAGNOSIS — J45901 Unspecified asthma with (acute) exacerbation: Secondary | ICD-10-CM | POA: Insufficient documentation

## 2014-06-26 DIAGNOSIS — Z87448 Personal history of other diseases of urinary system: Secondary | ICD-10-CM | POA: Diagnosis not present

## 2014-06-26 DIAGNOSIS — J209 Acute bronchitis, unspecified: Secondary | ICD-10-CM

## 2014-06-26 DIAGNOSIS — Z8619 Personal history of other infectious and parasitic diseases: Secondary | ICD-10-CM | POA: Diagnosis not present

## 2014-06-26 DIAGNOSIS — Z79899 Other long term (current) drug therapy: Secondary | ICD-10-CM | POA: Diagnosis not present

## 2014-06-26 DIAGNOSIS — E785 Hyperlipidemia, unspecified: Secondary | ICD-10-CM | POA: Diagnosis not present

## 2014-06-26 DIAGNOSIS — K219 Gastro-esophageal reflux disease without esophagitis: Secondary | ICD-10-CM | POA: Diagnosis not present

## 2014-06-26 DIAGNOSIS — Z8669 Personal history of other diseases of the nervous system and sense organs: Secondary | ICD-10-CM | POA: Diagnosis not present

## 2014-06-26 DIAGNOSIS — M1990 Unspecified osteoarthritis, unspecified site: Secondary | ICD-10-CM | POA: Diagnosis not present

## 2014-06-26 LAB — RAPID STREP SCREEN (MED CTR MEBANE ONLY): Streptococcus, Group A Screen (Direct): NEGATIVE

## 2014-06-26 NOTE — ED Notes (Signed)
Pt reports cough that has worsened last PM  and SOB since last PM

## 2014-06-27 ENCOUNTER — Emergency Department (HOSPITAL_BASED_OUTPATIENT_CLINIC_OR_DEPARTMENT_OTHER): Payer: BLUE CROSS/BLUE SHIELD

## 2014-06-27 LAB — BASIC METABOLIC PANEL
Anion gap: 3 — ABNORMAL LOW (ref 5–15)
BUN: 20 mg/dL (ref 6–23)
CO2: 24 mmol/L (ref 19–32)
Calcium: 8.3 mg/dL — ABNORMAL LOW (ref 8.4–10.5)
Chloride: 108 mmol/L (ref 96–112)
Creatinine, Ser: 0.91 mg/dL (ref 0.50–1.35)
GFR, EST NON AFRICAN AMERICAN: 80 mL/min — AB (ref >90)
GLUCOSE: 121 mg/dL — AB (ref 70–99)
Potassium: 3.8 mmol/L (ref 3.5–5.1)
Sodium: 135 mmol/L (ref 135–145)

## 2014-06-27 LAB — CBC WITH DIFFERENTIAL/PLATELET
Basophils Absolute: 0 10*3/uL (ref 0.0–0.1)
Basophils Relative: 0 % (ref 0–1)
Eosinophils Absolute: 0 10*3/uL (ref 0.0–0.7)
Eosinophils Relative: 1 % (ref 0–5)
HEMATOCRIT: 36.1 % — AB (ref 39.0–52.0)
Hemoglobin: 12.1 g/dL — ABNORMAL LOW (ref 13.0–17.0)
LYMPHS ABS: 0.4 10*3/uL — AB (ref 0.7–4.0)
LYMPHS PCT: 6 % — AB (ref 12–46)
MCH: 29.2 pg (ref 26.0–34.0)
MCHC: 33.5 g/dL (ref 30.0–36.0)
MCV: 87.2 fL (ref 78.0–100.0)
Monocytes Absolute: 0.8 10*3/uL (ref 0.1–1.0)
Monocytes Relative: 12 % (ref 3–12)
NEUTROS PCT: 81 % — AB (ref 43–77)
Neutro Abs: 5.2 10*3/uL (ref 1.7–7.7)
Platelets: 127 10*3/uL — ABNORMAL LOW (ref 150–400)
RBC: 4.14 MIL/uL — ABNORMAL LOW (ref 4.22–5.81)
RDW: 12.7 % (ref 11.5–15.5)
WBC: 6.4 10*3/uL (ref 4.0–10.5)

## 2014-06-27 LAB — TROPONIN I: Troponin I: <0.03 ng/mL (ref <0.031)

## 2014-06-27 MED ORDER — LEVALBUTEROL HCL 0.63 MG/3ML IN NEBU
0.6300 mg | INHALATION_SOLUTION | Freq: Once | RESPIRATORY_TRACT | Status: AC
  Administered 2014-06-27: 0.63 mg via RESPIRATORY_TRACT
  Filled 2014-06-27: qty 3

## 2014-06-27 MED ORDER — HYDROCOD POLST-CHLORPHEN POLST 10-8 MG/5ML PO LQCR: 5.0000 mL | Freq: Once | ORAL | Status: DC

## 2014-06-27 MED ORDER — HYDROCOD POLST-CHLORPHEN POLST 10-8 MG/5ML PO LQCR: 5.0000 mL | Freq: Two times a day (BID) | ORAL | Status: DC | PRN

## 2014-06-27 MED ORDER — AZITHROMYCIN 250 MG PO TABS: ORAL_TABLET | ORAL | Status: DC

## 2014-06-27 NOTE — Discharge Instructions (Signed)

## 2014-06-27 NOTE — ED Provider Notes (Signed)
CSN: [id number]     Arrival date & time 06/26/14  2117 History   First MD Initiated Contact with Patient 06/27/14 0116     Chief Complaint  Patient presents with  . Shortness of Breath     (Consider location/radiation/quality/duration/timing/severity/associated sxs/prior Treatment) HPI  This is a 79 year old male with a two-day history of cough and a one-day history of shortness of breath. He states the shortness of breath is a tightness in his throat that occurs when he has postnasal drip. He is able to clear this sensation by taking deep breaths. Shortness of breath is not exacerbated by lying flat or by exertion. He has had similar symptoms with previous bouts of bronchitis. He was noted to have a fever of 100.9 in the ED. He had chills at home. He denies chest pain, nausea, vomiting or diarrhea. He states he is not having symptoms in his lungs, just in his throat. Symptoms are moderate. He states he has a history of an irregular heartbeat and is on atenolol for this.  Past Medical History  Diagnosis Date  . Unspecified essential hypertension   . Esophageal reflux   . Hypersomnia with sleep apnea, unspecified   . Asthma   . Hyperlipidemia   . CAD (coronary artery disease)   . Enlarged prostate   . Kyphosis   . Trigger finger (acquired)   . Anemia, unspecified   . Lumbago   . Senile osteoporosis   . Unspecified vitamin D deficiency   . Elevated prostate specific antigen (PSA)   . Herpes zoster without mention of complication   . Acute gastric ulcer without mention of hemorrhage, perforation, or obstruction   . Impotence of organic origin   . Allergic rhinitis, cause unspecified   . Extrinsic asthma, unspecified   . Diaphragmatic hernia without mention of obstruction or gangrene   . Blood transfusion without reported diagnosis   . Unspecified sleep apnea     CPAP- settings 4-12   . Obstructive sleep apnea (adult) (pediatric)   . Pneumonia     hx of years ago   . Arthritis     Past Surgical History  Procedure Laterality Date  . Tonsillectomy  1945  . Shoulder surgery Left 04/1998    Dr French Ana  . Mole removal  1958  . Cardiac catheterization  03/04/2009    Dr [name]  . Colonoscopy    . Coronary artery bypass graft  2010  . Transurethral resection of prostate N/A 09/24/2013    Procedure: TRANSURETHRAL RESECTION OF THE PROSTATE (TURP) WITH GYRUS (STAGED RIGHT LATERAL AND MEDIAN LOBE);  Surgeon: Ailene Rud, MD;  Location: [facility];  Service: Urology;  Laterality: N/A;  . Cystoscopy with retrograde pyelogram, ureteroscopy and stent placement Right 10/04/2013    Procedure: [place],  AND STENT PLACEMENT;  Surgeon: Festus Aloe, MD;  Location: [facility];  Service: Urology;  Laterality: Right;   Family History  Problem Relation Age of Onset  . Stroke Mother   . Hypertension Father   . Stroke Father   . Heart disease Father   . Heart disease Paternal Grandfather   . Colon cancer Neg Hx    History  Substance Use Topics  . Smoking status: Never Smoker   . Smokeless tobacco: Never Used  . Alcohol Use: No    Review of Systems  All other systems reviewed and are negative.   Allergies  Compazine  Home Medications   Prior to Admission medications   Medication Sig  Start Date End Date Taking? Authorizing Provider  acetaminophen (TYLENOL) 500 MG tablet Take 1,000 mg by mouth every 6 (six) hours as needed for mild pain or moderate pain.    Historical Provider, MD  aspirin 325 MG EC tablet Take 325 mg by mouth daily.    Historical Provider, MD  atenolol (TENORMIN) 50 MG tablet TAKE 1 TABLET BY MOUTH EVERY DAY TO CONTROL HEART RHYTHM AND LOWER BLOOD PRESSURE 01/06/14   Blanchie Serve, MD  Calcium Citrate 250 MG TABS Take 250 tablets by mouth daily.     Historical Provider, MD  cholecalciferol (VITAMIN D) 1000 UNITS tablet Take 1,000 Units by mouth daily.      Historical Provider, MD  denosumab (PROLIA) 60 MG/ML SOLN  injection Inject 60 mg into the skin every 6 (six) months. Administer in upper arm, thigh, or abdomen.  Patient receives this injection every 6 months.    Historical Provider, MD  diphenhydrAMINE (BENADRYL) 25 mg capsule Take 50 mg by mouth every 6 (six) hours as needed for allergies.    Historical Provider, MD  finasteride (PROSCAR) 5 MG tablet Take 5 mg by mouth every morning.    Historical Provider, MD  FLUVIRIN SUSP Inject 0.5 mLs into the muscle once. 02/10/14   Historical Provider, MD  Glucosamine 500 MG CAPS Take 1,000 mg by mouth daily.     Historical Provider, MD  HYDROcodone-acetaminophen (NORCO) 5-325 MG per tablet Take 1 tablet by mouth every 6 (six) hours as needed for moderate pain. 09/26/13   Reece Packer, MD  Hydrocodone-Acetaminophen 5-300 MG TABS Take 1 tablet by mouth every 4 (four) hours as needed. for pain 06/01/14   Historical Provider, MD  Multiple Vitamin (MULTIVITAMIN) tablet Take 1 tablet by mouth daily.      Historical Provider, MD  omeprazole (PRILOSEC) 20 MG capsule TAKE ONE CAPSULE BY MOUTH DAILY TO REDUCE STOMACH ACID AND PROTECT ESOPHAGUS 06/09/14   Estill Dooms, MD  omeprazole (PRILOSEC) 20 MG capsule TAKE ONE CAPSULE BY MOUTH TWICE A DAY TO REDUCE STOMACH ACID AND PROTECT ESOPHAGUS 06/16/14   Estill Dooms, MD  Potassium 99 MG TABS Take 99 mg by mouth daily.    Historical Provider, MD  Probiotic Product (PROBIOTIC DAILY PO) Take 2 capsules by mouth daily.     Historical Provider, MD  rosuvastatin (CRESTOR) 20 MG tablet Take one tablet by mouth once daily for cholesterol 05/19/14   Estill Dooms, MD   BP 124/52 mmHg  Pulse 69  Temp(Src) 100.9 F (38.3 C) (Oral)  Resp 20  SpO2 100%   Physical Exam General: Well-developed, well-nourished male in no acute distress; appearance consistent with age of record HENT: normocephalic; atraumatic Eyes: pupils equal, round and reactive to light; extraocular muscles intact Neck: supple Heart: regular rate and rhythm;  frequent ectopy Lungs: clear to auscultation bilaterally Abdomen: soft; nondistended; nontender; no masses or hepatosplenomegaly; bowel sounds present Extremities: No deformity; full range of motion; pulses normal; no edema Neurologic: Awake, alert and oriented; motor function intact in all extremities and symmetric; no facial droop Skin: Warm and dry Psychiatric: Normal mood and affect    ED Course  Procedures (including critical care time)    EKG Interpretation  Date/Time:  Sunday June 27 2014 00:40:01 EST Ventricular Rate:  97 PR Interval:  150 QRS Duration: 102 QT Interval:  346 QTC Calculation: 439 R Axis:     Text Interpretation:  Sinus rhythm with frequent and consecutive Premature ventricular complexes ST \\T \ T  wave abnormality, consider lateral ischemia Abnormal ECG LVH with repolarization abnormality Ectopy not seen previously Confirmed by Florina Ou  MD, Jenny Reichmann ([id number]) on 06/27/2014 1:19:52 AM       MDM   Nursing notes and vitals signs, including pulse oximetry, reviewed.  Summary of this visit's results, reviewed by myself:  Labs:  Results for orders placed or performed during the hospital encounter of 06/26/14 (from the past 24 hour(s))  Rapid strep screen     Status: None   Collection Time: 06/26/14  9:29 PM  Result Value Ref Range   Streptococcus, Group A Screen (Direct) NEGATIVE NEGATIVE  CBC with Differential     Status: Abnormal   Collection Time: 06/27/14  1:03 AM  Result Value Ref Range   WBC 6.4 4.0 - 10.5 K/uL   RBC 4.14 (L) 4.22 - 5.81 MIL/uL   Hemoglobin 12.1 (L) 13.0 - 17.0 g/dL   HCT 36.1 (L) 39.0 - 52.0 %   MCV 87.2 78.0 - 100.0 fL   MCH 29.2 26.0 - 34.0 pg   MCHC 33.5 30.0 - 36.0 g/dL   RDW 12.7 11.5 - 15.5 %   Platelets 127 (L) 150 - 400 K/uL   Neutrophils Relative % 81 (H) 43 - 77 %   Neutro Abs 5.2 1.7 - 7.7 K/uL   Lymphocytes Relative 6 (L) 12 - 46 %   Lymphs Abs 0.4 (L) 0.7 - 4.0 K/uL   Monocytes Relative 12 3 - 12 %   Monocytes  Absolute 0.8 0.1 - 1.0 K/uL   Eosinophils Relative 1 0 - 5 %   Eosinophils Absolute 0.0 0.0 - 0.7 K/uL   Basophils Relative 0 0 - 1 %   Basophils Absolute 0.0 0.0 - 0.1 K/uL  Basic metabolic panel     Status: Abnormal   Collection Time: 06/27/14  1:03 AM  Result Value Ref Range   Sodium 135 135 - 145 mmol/L   Potassium 3.8 3.5 - 5.1 mmol/L   Chloride 108 96 - 112 mmol/L   CO2 24 19 - 32 mmol/L   Glucose, Bld 121 (H) 70 - 99 mg/dL   BUN 20 6 - 23 mg/dL   Creatinine, Ser 0.91 0.50 - 1.35 mg/dL   Calcium 8.3 (L) 8.4 - 10.5 mg/dL   GFR calc non Af Amer 80 (L) >90 mL/min   GFR calc Af Amer >90 >90 mL/min   Anion gap 3 (L) 5 - 15  Troponin I     Status: None   Collection Time: 06/27/14  1:03 AM  Result Value Ref Range   Troponin I <0.03 <0.031 ng/mL    Imaging Studies: Dg Neck Soft Tissue  06/27/2014   CLINICAL DATA:  Pt reports cough that has worsened last PM and SOB since last PM with pressure in chest and throatHX CAD, kyyphosis, anemia, asthma, CABG  EXAM: NECK SOFT TISSUES - 1+ VIEW  COMPARISON:  01/18/2009  FINDINGS: Neck soft tissues are unremarkable. Airway is widely patent. Mild disc degenerative changes noted at C3-C4 and C6-C7, stable. Bony structures are demineralized.  IMPRESSION: No soft tissue abnormality.  Widely patent cervical airway.   Electronically Signed   By: Lajean Manes M.D.   On: 06/27/2014 01:51   Dg Chest 2 View  06/26/2014   CLINICAL DATA:  Pt reports cough that has worsened last PM and SOB since last PM.Hx: CAD, Kyphosis, CABG, Shoulder surgery.  EXAM: CHEST  2 VIEW  COMPARISON:  03/19/2014  FINDINGS: Changes from CABG surgery are  stable. Cardiac silhouette is normal in size and configuration. Normal mediastinal and hilar contours.  Lungs are mildly hyperexpanded. No lung consolidation or edema. No pleural effusion or pneumothorax.  Bony thorax is demineralized but grossly intact.  IMPRESSION: No acute cardiopulmonary disease.   Electronically Signed   By: Lajean Manes M.D.   On: 06/26/2014 21:50   2:43 AM Patient feels better after Xopenex neb treatment. He states he has an albuterol inhaler at home. He was encouraged to use it.     Wynetta Fines, MD 06/27/14 [phone]

## 2014-06-27 NOTE — ED Notes (Signed)
I completed an ECG and gave copy to Dr. Florina Ou, and showed nurse [place]. I placed patient on monitor and nassal cannula with 2 ltrs. O2. Patient states he had previous cardiac problems.

## 2014-06-27 NOTE — ED Notes (Signed)
After patient stated he had previous cardiac problems, pressure in chest and throat, I placed patient on 2 ltrs. O2 by cannula, and placed him on monitor.

## 2014-06-30 LAB — CULTURE, GROUP A STREP: STREP A CULTURE: NEGATIVE

## 2014-07-15 ENCOUNTER — Ambulatory Visit (INDEPENDENT_AMBULATORY_CARE_PROVIDER_SITE_OTHER): Payer: BLUE CROSS/BLUE SHIELD | Admitting: Internal Medicine

## 2014-07-15 ENCOUNTER — Encounter: Payer: Self-pay | Admitting: Internal Medicine

## 2014-07-15 VITALS — BP 124/66 | HR 75 | Ht 67.0 in | Wt 154.6 lb

## 2014-07-15 DIAGNOSIS — G4733 Obstructive sleep apnea (adult) (pediatric): Secondary | ICD-10-CM

## 2014-07-15 DIAGNOSIS — R0602 Shortness of breath: Secondary | ICD-10-CM

## 2014-07-15 NOTE — Progress Notes (Signed)
Subjective:    Patient ID: Brent Griffith, male    DOB: [birth year], 79 y.o.   MRN: [id number]  HPI 12/08/10- 79 year old male never smoker, followed for obstructive sleep apnea, periodic limb movement, CAD/ CABG // Dr Donneta Romberg for allergic asthma Last here- November 10, 2009- note reviewed CPAP comfortable, used on trips and all night every night-AutoPAP Advanced, full face mask. Wife likes it because it prevents snoring. Feels well rested. Usual bedtime 12MN and up around 5- he accepts responsibility to get enough sleep. Denies EDS driving or etc. No needs for changes. He gets supplies ok. We discussed mask options. Heart surgery Nov 2010 CABG 4v  03/17/12- 79 year old male never smoker, followed for obstructive sleep apnea, periodic limb movement, CAD/ CABG // Dr Donneta Romberg for allergic asthma  Wears CPAP AutoPap/ Advanced nightly 6-8hrs during week and 10-12 hours on the weekends. Denies problems with pressure and mask. Sometimes pressure is too high or too low Family tells him he will snore through very rarely.  04/09/13- 79 year old male never smoker, followed for obstructive sleep apnea, periodic limb movement, CAD/ CABG // Dr Donneta Romberg for allergic asthma  Wife here. FOLLOWS FOR:  Wear CPAP 6-8 hours per night.  Discuss getting CPAP settings change Good compliance and control, but he would like to try a lower pressure especially to start at night. Using autoPAP 8-15. Pending prostatectomy.  04/15/14-  79 year old male never smoker, followed for obstructive sleep apnea, periodic limb movement, CAD/ CABG // Dr Donneta Romberg for allergic asthma  Wife here. FOLLOWS FOR: Patient wears CPAP Auto 8-15 ( He thinks 4-12)/ Advanced  6-7 hrs each night.Patient was in hospital 1 month ago. Patient reports trouble with power button. He is having a cough with clear mucus. No sob or wheeze. Hosp November with acute bronchitis. Now occasional SOB w some cough. CXR 03/19/14 IMPRESSION: Hyperinflation of the lungs. This could be  the secondary to asthma or COPD related to smoking. No airspace disease or other acute findings are seen. Prior CABG. Electronically Signed  By: Curlene Dolphin M.D.  On: 03/19/2014 20:17  07/15/14-79 year old male never smoker, followed for obstructive sleep apnea, periodic limb movement, CAD/ CABG // Dr Donneta Romberg for allergic asthma  Wife here. CPAP auto 6-12/ Advanced FOLLOWS FOR: Pt took CPAP to [facility] today to have DL taken care of. Wears CPAP every night for about 5-8 hours(as long as he can sleep);pressure working well for patient as well. He likes auto titration and has been very compliant with his CPAP. His old machine is worn out and buttons are falling off. PFT: 05/04/2014: Normal spirometry flows, insignificant response to bronchodilator, moderate diffusion defect 59% of predicted, possible air trapping. CXR 06/26/14 IMPRESSION: No acute cardiopulmonary disease. Electronically Signed  By: Lajean Manes M.D.  On: 06/26/2014 21:50  Review of Systems- see HPI Constitutional:   No-   weight loss, night sweats, fevers, chills, fatigue, lassitude. HEENT:   No-   headaches, difficulty swallowing, tooth/dental problems, sore throat,                  No-   sneezing, itching, ear ache, nasal congestion, post nasal drip,  CV:  No-   chest pain, orthopnea, PND, swelling in lower extremities, anasarca,dizziness, palpitations GI:  No-   heartburn, indigestion, abdominal pain, nausea, vomiting,  Resp: +  shortness of breath with exertion or at rest.  No-  excess mucus,             No-   productive cough,  +  non-productive cough,  No-  coughing up of blood.              No-   change in color of mucus.  No- wheezing.   Skin: No-   rash or lesions. GU: . MS:  No-   joint pain or swelling. Marland Kitchen Psych:  No- change in mood or affect. No depression or anxiety.  No memory loss.    Objective:   General- Alert, Oriented, Affect-appropriate, Distress- none acute. Medium build. Skin- rash-none, lesions-  none, excoriation- none Lymphadenopathy- none Head- atraumatic            Eyes- Gross vision intact, PERRLA, conjunctivae clear secretions            Ears- Hearing, canals normal            Nose- Clear, +Septal dev/ external nasal deviation,  No-mucus, polyps, erosion, perforation             Throat- Mallampati II-III , mucosa clear , drainage- none, tonsils- atrophic Neck- flexible , trachea midline, no stridor , thyroid nl, carotid no bruit Chest - symmetrical excursion , unlabored           Heart/CV- RRR , no murmur , no gallop  , no rub, nl s1 s2                           - JVD- none , edema- none, stasis changes- none, varices- none           Lung- clear to P&A, wheeze- none, cough- none , dullness-none, rub- none           Chest wall-  Abd-  Br/ Gen/ Rectal- Not done, not indicated Extrem- cyanosis- none, clubbing, none, atrophy- none, strength- nl Neuro- grossly intact to observation    Assessment & Plan:

## 2014-07-15 NOTE — Assessment & Plan Note (Signed)
He has done very well with CPAP and likes auto titration but his machine is worn out. Plan-replacement CPAP machine auto 6-12/ Advanced

## 2014-07-15 NOTE — Patient Instructions (Signed)
Order- DME Advanced replacement for old worn out CPAP machine  Auto 6-12, mask of choice, supplies, humidifier  Please call as needed

## 2014-07-15 NOTE — Assessment & Plan Note (Signed)
Isolated diffusion defect in this never smoker is most likely a reflection of his known cardiac disease. His dyspnea on exertion probably also is cardiogenic.

## 2014-08-10 LAB — BASIC METABOLIC PANEL
BUN: 16 mg/dL (ref 4–21)
Creatinine: 0.8 mg/dL (ref 0.6–1.3)
Glucose: 74 mg/dL
Potassium: 4.5 mmol/L (ref 3.4–5.3)
SODIUM: 139 mmol/L (ref 137–147)

## 2014-08-10 LAB — CBC AND DIFFERENTIAL
HCT: 37 % — AB (ref 41–53)
HEMOGLOBIN: 12.5 g/dL — AB (ref 13.5–17.5)
Platelets: 160 10*3/uL (ref 150–399)
WBC: 6 10^3/mL

## 2014-08-10 LAB — LIPID PANEL
Cholesterol: 117 mg/dL (ref 0–200)
HDL: 58 mg/dL (ref 35–70)
LDL CALC: 45 mg/dL
Triglycerides: 68 mg/dL (ref 40–160)

## 2014-08-10 LAB — PSA: PSA: 1.4

## 2014-08-10 LAB — HEPATIC FUNCTION PANEL
ALT: 16 U/L (ref 10–40)
AST: 20 U/L (ref 14–40)

## 2014-08-13 ENCOUNTER — Encounter: Payer: Self-pay | Admitting: *Deleted

## 2014-08-31 ENCOUNTER — Encounter: Payer: Self-pay | Admitting: Internal Medicine

## 2014-10-27 ENCOUNTER — Other Ambulatory Visit: Payer: BLUE CROSS/BLUE SHIELD

## 2014-10-27 DIAGNOSIS — E7800 Pure hypercholesterolemia, unspecified: Secondary | ICD-10-CM

## 2014-10-27 DIAGNOSIS — I10 Essential (primary) hypertension: Secondary | ICD-10-CM

## 2014-10-28 LAB — LIPID PANEL
Chol/HDL Ratio: 2.5 ratio (ref 0.0–5.0)
Cholesterol, Total: 123 mg/dL (ref 100–199)
HDL: 50 mg/dL
LDL Calculated: 57 mg/dL (ref 0–99)
Triglycerides: 79 mg/dL (ref 0–149)
VLDL Cholesterol Cal: 16 mg/dL (ref 5–40)

## 2014-10-28 LAB — BASIC METABOLIC PANEL WITH GFR
BUN/Creatinine Ratio: 20 (ref 10–22)
BUN: 18 mg/dL (ref 8–27)
CO2: 24 mmol/L (ref 18–29)
Calcium: 9.6 mg/dL (ref 8.6–10.2)
Chloride: 101 mmol/L (ref 97–108)
Creatinine, Ser: 0.88 mg/dL (ref 0.76–1.27)
GFR calc Af Amer: 96 mL/min/{1.73_m2}
GFR calc non Af Amer: 83 mL/min/{1.73_m2}
Glucose: 100 mg/dL — ABNORMAL HIGH (ref 65–99)
Potassium: 4.4 mmol/L (ref 3.5–5.2)
Sodium: 138 mmol/L (ref 134–144)

## 2014-11-02 ENCOUNTER — Ambulatory Visit (INDEPENDENT_AMBULATORY_CARE_PROVIDER_SITE_OTHER): Payer: BLUE CROSS/BLUE SHIELD | Admitting: Internal Medicine

## 2014-11-02 ENCOUNTER — Encounter: Payer: Self-pay | Admitting: Internal Medicine

## 2014-11-02 VITALS — BP 160/84 | HR 63 | Temp 98.1°F | Resp 18 | Ht 67.0 in | Wt 150.6 lb

## 2014-11-02 DIAGNOSIS — E7800 Pure hypercholesterolemia, unspecified: Secondary | ICD-10-CM

## 2014-11-02 DIAGNOSIS — I10 Essential (primary) hypertension: Secondary | ICD-10-CM

## 2014-11-02 DIAGNOSIS — G4733 Obstructive sleep apnea (adult) (pediatric): Secondary | ICD-10-CM | POA: Diagnosis not present

## 2014-11-02 DIAGNOSIS — I2510 Atherosclerotic heart disease of native coronary artery without angina pectoris: Secondary | ICD-10-CM

## 2014-11-02 MED ORDER — DOXAZOSIN MESYLATE 4 MG PO TABS: ORAL_TABLET | ORAL | Status: DC

## 2014-11-02 NOTE — Progress Notes (Signed)
Patient ID: Brent Griffith, male   DOB: Aug 18, 1935, 79 y.o.   MRN: [id number]    Facility  [facility]    Place of Service:   OFFICE    Allergies  Allergen Reactions  . Compazine [Prochlorperazine Edisylate] Anxiety    Chief Complaint  Patient presents with  . Medical Management of Chronic Issues    HPI:  Essential hypertension:  controlled  Atherosclerosis of native coronary artery of native heart without angina pectoris: some palpitations at night. Occasional dyspnea.   Obstructive sleep apnea; wearing CPAP nightly.  Pure hypercholesterolemia: controlled  Medications: Patient's Medications  New Prescriptions   No medications on file  Previous Medications   ACETAMINOPHEN (TYLENOL) 500 MG TABLET    Take 1,000 mg by mouth every 6 (six) hours as needed for mild pain or moderate pain.   ASPIRIN 325 MG EC TABLET    Take 325 mg by mouth daily.   ATENOLOL (TENORMIN) 50 MG TABLET    TAKE 1 TABLET BY MOUTH EVERY DAY TO CONTROL HEART RHYTHM AND LOWER BLOOD PRESSURE   CALCIUM CITRATE 250 MG TABS    Take 250 tablets by mouth daily.    CHLORPHENIRAMINE-HYDROCODONE (TUSSIONEX PENNKINETIC ER) 10-8 MG/5ML LQCR    Take 5 mLs by mouth every 12 (twelve) hours as needed.   CHOLECALCIFEROL (VITAMIN D) 1000 UNITS TABLET    Take 1,000 Units by mouth daily.     DENOSUMAB (PROLIA) 60 MG/ML SOLN INJECTION    Inject 60 mg into the skin every 6 (six) months. Administer in upper arm, thigh, or abdomen.  Patient receives this injection every 6 months.   DIPHENHYDRAMINE (BENADRYL) 25 MG CAPSULE    Take 50 mg by mouth every 6 (six) hours as needed for allergies.   FINASTERIDE (PROSCAR) 5 MG TABLET    Take 5 mg by mouth every morning.   GLUCOSAMINE 500 MG CAPS    Take 1,000 mg by mouth daily.    HYDROCODONE-ACETAMINOPHEN (NORCO) 5-325 MG PER TABLET    Take 1 tablet by mouth every 6 (six) hours as needed for moderate pain.   MULTIPLE VITAMIN (MULTIVITAMIN) TABLET    Take 1 tablet by mouth daily.     OMEPRAZOLE  (PRILOSEC) 20 MG CAPSULE    TAKE ONE CAPSULE BY MOUTH DAILY TO REDUCE STOMACH ACID AND PROTECT ESOPHAGUS   POTASSIUM 99 MG TABS    Take 99 mg by mouth daily.   PROBIOTIC PRODUCT (PROBIOTIC DAILY PO)    Take 2 capsules by mouth daily.    ROSUVASTATIN (CRESTOR) 20 MG TABLET    Take one tablet by mouth once daily for cholesterol  Modified Medications   No medications on file  Discontinued Medications   No medications on file     Review of Systems  Constitutional: Negative.   HENT: Negative.   Eyes: Negative.   Respiratory: Negative.        Wears CPAP. Followed by Dr. Annamaria Boots.  Cardiovascular: Negative for chest pain, palpitations and leg swelling.  Gastrointestinal: Negative.   Endocrine: Negative.   Musculoskeletal: Negative.   Skin: Negative.   Allergic/Immunologic: Negative.   Neurological: Negative.   Hematological: Negative.   Psychiatric/Behavioral: Negative.     Filed Vitals:   11/02/14 1540  BP: 160/84  Pulse: 63  Temp: 98.1 F (36.7 C)  TempSrc: Oral  Resp: 18  Height: 5\' 7"  (1.702 m)  Weight: 150 lb 9.6 oz (68.312 kg)  SpO2: 97%   Body mass index is 23.58 kg/(m^2).  Physical  Exam  Constitutional: He is oriented to person, place, and time. He appears well-developed and well-nourished. No distress.  HENT:  Head: Atraumatic.  Right Ear: External ear normal.  Left Ear: External ear normal.  Nose: Nose normal.  Eyes: Conjunctivae and EOM are normal. Pupils are equal, round, and reactive to light.  Neck: Normal range of motion. Neck supple. No JVD present. No tracheal deviation present. No thyromegaly present.  Cardiovascular: Normal rate, regular rhythm, normal heart sounds and intact distal pulses.  Exam reveals no gallop and no friction rub.   No murmur heard. Pulmonary/Chest: Effort normal and breath sounds normal. No respiratory distress. He has no wheezes. He has no rales. He exhibits no tenderness.  Abdominal: Soft. Bowel sounds are normal. He exhibits no  distension and no mass. There is no tenderness. There is no rebound.  Musculoskeletal: Normal range of motion. He exhibits no edema or tenderness.  Lymphadenopathy:    He has no cervical adenopathy.  Neurological: He is alert and oriented to person, place, and time. He has normal reflexes. No cranial nerve deficit. Coordination normal.  Skin: Skin is warm and dry. No rash noted. No erythema. No pallor.  Psychiatric: He has a normal mood and affect. His behavior is normal. Judgment and thought content normal.     Labs reviewed: Appointment on 10/27/2014  Component Date Value Ref Range Status  . Cholesterol, Total 10/27/2014 123  100 - 199 mg/dL Final  . Triglycerides 10/27/2014 79  0 - 149 mg/dL Final  . HDL 10/27/2014 50  >39 mg/dL Final   Comment: According to ATP-III Guidelines, HDL-C >59 mg/dL is considered a negative risk factor for CHD.   Marland Kitchen VLDL Cholesterol Cal 10/27/2014 16  5 - 40 mg/dL Final  . LDL Calculated 10/27/2014 57  0 - 99 mg/dL Final  . Chol/HDL Ratio 10/27/2014 2.5  0.0 - 5.0 ratio units Final   Comment:                                   T. Chol/HDL Ratio                                             Men  Women                               1/2 Avg.Risk  3.4    3.3                                   Avg.Risk  5.0    4.4                                2X Avg.Risk  9.6    7.1                                3X Avg.Risk 23.4   11.0   . Glucose 10/27/2014 100* 65 - 99 mg/dL Final  . BUN 10/27/2014 18  8 - 27 mg/dL Final  . Creatinine, Ser 10/27/2014 0.88  0.76 - 1.27 mg/dL  Final  . GFR calc non Af Amer 10/27/2014 83  >59 mL/min/1.73 Final  . GFR calc Af Amer 10/27/2014 96  >59 mL/min/1.73 Final  . BUN/Creatinine Ratio 10/27/2014 20  10 - 22 Final  . Sodium 10/27/2014 138  134 - 144 mmol/L Final  . Potassium 10/27/2014 4.4  3.5 - 5.2 mmol/L Final  . Chloride 10/27/2014 101  97 - 108 mmol/L Final  . CO2 10/27/2014 24  18 - 29 mmol/L Final  . Calcium 10/27/2014 9.6  8.6  - 10.2 mg/dL Final  Abstract on 08/13/2014  Component Date Value Ref Range Status  . Hemoglobin 08/10/2014 12.5* 13.5 - 17.5 g/dL Final  . HCT 08/10/2014 37* 41 - 53 % Final  . Platelets 08/10/2014 160  150 - 399 K/L Final  . WBC 08/10/2014 6.0   Final  . Glucose 08/10/2014 74   Final  . BUN 08/10/2014 16  4 - 21 mg/dL Final  . Creatinine 08/10/2014 0.8  0.6 - 1.3 mg/dL Final  . Potassium 08/10/2014 4.5  3.4 - 5.3 mmol/L Final  . Sodium 08/10/2014 139  137 - 147 mmol/L Final  . Triglycerides 08/10/2014 68  40 - 160 mg/dL Final  . Cholesterol 08/10/2014 117  0 - 200 mg/dL Final  . HDL 08/10/2014 58  35 - 70 mg/dL Final  . LDL Cholesterol 08/10/2014 45   Final  . ALT 08/10/2014 16  10 - 40 U/L Final  . AST 08/10/2014 20  14 - 40 U/L Final  . PSA 08/10/2014 1.4   Final    Assessment/Plan  1. Essential hypertension -resume Cardura  2. Atherosclerosis of native coronary artery of native heart without angina pectoris stable  3. Obstructive sleep apnea Continue CPAP  4. Pure hypercholesterolemia controlled

## 2014-12-28 ENCOUNTER — Other Ambulatory Visit: Payer: Self-pay | Admitting: Internal Medicine

## 2015-03-02 ENCOUNTER — Ambulatory Visit (INDEPENDENT_AMBULATORY_CARE_PROVIDER_SITE_OTHER): Payer: BLUE CROSS/BLUE SHIELD | Admitting: Internal Medicine

## 2015-03-02 ENCOUNTER — Encounter: Payer: Self-pay | Admitting: Internal Medicine

## 2015-03-02 VITALS — BP 120/78 | HR 66 | Temp 97.9°F | Resp 20 | Ht 67.0 in | Wt 149.6 lb

## 2015-03-02 DIAGNOSIS — E7800 Pure hypercholesterolemia, unspecified: Secondary | ICD-10-CM | POA: Diagnosis not present

## 2015-03-02 DIAGNOSIS — I10 Essential (primary) hypertension: Secondary | ICD-10-CM

## 2015-03-02 DIAGNOSIS — K219 Gastro-esophageal reflux disease without esophagitis: Secondary | ICD-10-CM | POA: Diagnosis not present

## 2015-03-02 DIAGNOSIS — I2510 Atherosclerotic heart disease of native coronary artery without angina pectoris: Secondary | ICD-10-CM

## 2015-03-02 DIAGNOSIS — R197 Diarrhea, unspecified: Secondary | ICD-10-CM

## 2015-03-02 NOTE — Progress Notes (Signed)
Patient ID: Brent Griffith, male   DOB: 13-Nov-1935, 79 y.o.   MRN: [id number]    Facility  [facility]    Place of Service:   OFFICE    Allergies  Allergen Reactions  . Compazine [Prochlorperazine Edisylate] Anxiety    Chief Complaint  Patient presents with  . Medical Management of Chronic Issues    4 month follow-up for Hypertension, Patient c/o has had diarrehea   since yesterday    HPI:   Essential hypertension - controlled  Pure hypercholesterolemia - controlled  Gastroesophageal reflux disease without esophagitis - asymptomatic an current treatment  Atherosclerosis of native coronary artery of native heart without angina pectoris - asymptomatic  Diarrhea, unspecified type - started about 2 days ago. Used Imodium. Bloated and queasy. No blood in the stool.    Medications: Patient's Medications  New Prescriptions   No medications on file  Previous Medications   ACETAMINOPHEN (TYLENOL) 500 MG TABLET    Take 1,000 mg by mouth every 6 (six) hours as needed for mild pain or moderate pain.   ASPIRIN 325 MG EC TABLET    Take 325 mg by mouth daily.   ATENOLOL (TENORMIN) 50 MG TABLET    TAKE 1 TABLET BY MOUTH EVERY DAY TO CONTROL HEART RHYTHM AND LOWER BLOOD PRESSURE   CALCIUM CITRATE 250 MG TABS    Take 250 tablets by mouth daily.    CHLORPHENIRAMINE-HYDROCODONE (TUSSIONEX PENNKINETIC ER) 10-8 MG/5ML LQCR    Take 5 mLs by mouth every 12 (twelve) hours as needed.   CHOLECALCIFEROL (VITAMIN D) 1000 UNITS TABLET    Take 1,000 Units by mouth daily.     DENOSUMAB (PROLIA) 60 MG/ML SOLN INJECTION    Inject 60 mg into the skin every 6 (six) months. Administer in upper arm, thigh, or abdomen.  Patient receives this injection every 6 months.   DIPHENHYDRAMINE (BENADRYL) 25 MG CAPSULE    Take 50 mg by mouth every 6 (six) hours as needed for allergies.   DOXAZOSIN (CARDURA) 4 MG TABLET    One daily to help control BP   FINASTERIDE (PROSCAR) 5 MG TABLET    Take 5 mg by mouth every morning.   GLUCOSAMINE 500 MG CAPS    Take 1,000 mg by mouth daily.    HYDROCODONE-ACETAMINOPHEN (NORCO) 5-325 MG PER TABLET    Take 1 tablet by mouth every 6 (six) hours as needed for moderate pain.   MULTIPLE VITAMIN (MULTIVITAMIN) TABLET    Take 1 tablet by mouth daily.     OMEPRAZOLE (PRILOSEC) 20 MG CAPSULE    TAKE ONE CAPSULE BY MOUTH DAILY TO REDUCE STOMACH ACID AND PROTECT ESOPHAGUS   POTASSIUM 99 MG TABS    Take 99 mg by mouth daily.   PROBIOTIC PRODUCT (PROBIOTIC DAILY PO)    Take 2 capsules by mouth daily.    ROSUVASTATIN (CRESTOR) 20 MG TABLET    Take one tablet by mouth once daily for cholesterol  Modified Medications   No medications on file  Discontinued Medications   No medications on file    Review of Systems  Constitutional: Negative.   HENT: Negative.   Eyes: Negative.   Respiratory: Negative.        Wears CPAP. Followed by Dr. Annamaria Boots.  Cardiovascular: Negative for chest pain, palpitations and leg swelling.  Gastrointestinal: Positive for diarrhea.  Endocrine: Negative.   Musculoskeletal: Negative.   Skin: Negative.   Allergic/Immunologic: Negative.   Neurological: Negative.   Hematological: Negative.   Psychiatric/Behavioral: Negative.  Filed Vitals:   03/02/15 1532  BP: 120/78  Pulse: 66  Temp: 97.9 F (36.6 C)  TempSrc: Oral  Resp: 20  Height: _0  (1.702 m)  Weight: 149 lb 9.6 oz (67.858 kg)  SpO2: 97%   Body mass index is 23.43 kg/(m^2).  Physical Exam  Constitutional: He is oriented to person, place, and time. He appears well-developed and well-nourished. No distress.  HENT:  Head: Atraumatic.  Right Ear: External ear normal.  Left Ear: External ear normal.  Nose: Nose normal.  Eyes: Conjunctivae and EOM are normal. Pupils are equal, round, and reactive to light.  Neck: Normal range of motion. Neck supple. No JVD present. No tracheal deviation present. No thyromegaly present.  Cardiovascular: Normal rate, regular rhythm, normal heart sounds and  intact distal pulses.  Exam reveals no gallop and no friction rub.   No murmur heard. Pulmonary/Chest: Effort normal and breath sounds normal. No respiratory distress. He has no wheezes. He has no rales. He exhibits no tenderness.  Abdominal: Soft. Bowel sounds are normal. He exhibits no distension and no mass. There is no tenderness. There is no rebound.  Musculoskeletal: Normal range of motion. He exhibits no edema or tenderness.  Lymphadenopathy:    He has no cervical adenopathy.  Neurological: He is alert and oriented to person, place, and time. He has normal reflexes. No cranial nerve deficit. Coordination normal.  Skin: Skin is warm and dry. No rash noted. No erythema. No pallor.  Psychiatric: He has a normal mood and affect. His behavior is normal. Judgment and thought content normal.    Labs reviewed: Lab Summary Latest Ref Rng 10/27/2014 08/10/2014 06/27/2014  Hemoglobin 13.5 - 17.5 g/dL (None) 12.5(A) 12.1(L)  Hematocrit 41 - 53 % (None) 37(A) 36.1(L)  White count - (None) 6.0 6.4  Platelet count 150 - 399 K/L (None) 160 127(L)  Sodium 134 - 144 mmol/L 138 139 135  Potassium 3.5 - 5.2 mmol/L 4.4 4.5 3.8  Calcium 8.6 - 10.2 mg/dL 9.6 (None) 8.3(L)  Phosphorus - (None) (None) (None)  Creatinine 0.76 - 1.27 mg/dL 0.88 0.8 0.91  AST 14 - 40 U/L (None) 20 (None)  Alk Phos - (None) (None) (None)  Bilirubin - (None) (None) (None)  Glucose 65 - 99 mg/dL 100(H) 74 121(H)  Cholesterol 0 - 200 mg/dL (None) 117 (None)  HDL cholesterol >39 mg/dL 50 58 (None)  Triglycerides 0 - 149 mg/dL 79 68 (None)  LDL Direct - (None) (None) (None)  LDL Calc 0 - 99 mg/dL 57 45 (None)  Total protein - (None) (None) (None)  Albumin - (None) (None) (None)   No results found for: TSH, T3TOTAL, T4TOTAL, THYROIDAB Lab Results  Component Value Date   BUN 18 10/27/2014   Lab Results  Component Value Date   HGBA1C 5.6 07/28/2012    Assessment/Plan  1. Essential hypertension - Comprehensive  metabolic panel; Future  2. Pure hypercholesterolemia - Lipid panel; Future  3. Gastroesophageal reflux disease without esophagitis Continue current medications  4. Atherosclerosis of native coronary artery of native heart without angina pectoris asymptomatic  5. Diarrhea, unspecified type Continue Imodium prn - Comprehensive metabolic panel; Future

## 2015-03-14 ENCOUNTER — Encounter: Payer: Self-pay | Admitting: Internal Medicine

## 2015-04-06 ENCOUNTER — Encounter: Payer: Self-pay | Admitting: Internal Medicine

## 2015-04-15 ENCOUNTER — Telehealth: Payer: Self-pay | Admitting: *Deleted

## 2015-04-15 NOTE — Telephone Encounter (Signed)
Letter received from [facility] [phone] they will no longer cover Crestor. They will have to try an alternative: atorvastatin, fluvastatin,lovastatin,pravastatin,rosuvastatin,simvastatin. Given letter to Dr. Nyoka Cowden to review.

## 2015-04-22 NOTE — Telephone Encounter (Signed)
Per Dr. Cyndia Skeeters patient, would like to change Crestor to Atorvastatin 20mg  once daily.  LMOM for patient to return call.

## 2015-05-15 ENCOUNTER — Other Ambulatory Visit: Payer: Self-pay | Admitting: Internal Medicine

## 2015-05-18 ENCOUNTER — Other Ambulatory Visit: Payer: Self-pay | Admitting: *Deleted

## 2015-07-04 ENCOUNTER — Other Ambulatory Visit: Payer: BLUE CROSS/BLUE SHIELD

## 2015-07-04 DIAGNOSIS — R197 Diarrhea, unspecified: Secondary | ICD-10-CM

## 2015-07-04 DIAGNOSIS — I10 Essential (primary) hypertension: Secondary | ICD-10-CM

## 2015-07-04 DIAGNOSIS — E7800 Pure hypercholesterolemia, unspecified: Secondary | ICD-10-CM

## 2015-07-05 ENCOUNTER — Emergency Department (HOSPITAL_BASED_OUTPATIENT_CLINIC_OR_DEPARTMENT_OTHER): Payer: BLUE CROSS/BLUE SHIELD

## 2015-07-05 ENCOUNTER — Emergency Department (HOSPITAL_BASED_OUTPATIENT_CLINIC_OR_DEPARTMENT_OTHER)
Admission: EM | Admit: 2015-07-05 | Discharge: 2015-07-05 | Disposition: A | Discharge status: Home / Self Care | Payer: BLUE CROSS/BLUE SHIELD | Attending: Physician Assistant | Admitting: Physician Assistant

## 2015-07-05 ENCOUNTER — Encounter (HOSPITAL_BASED_OUTPATIENT_CLINIC_OR_DEPARTMENT_OTHER): Payer: Self-pay | Admitting: *Deleted

## 2015-07-05 DIAGNOSIS — I2510 Atherosclerotic heart disease of native coronary artery without angina pectoris: Secondary | ICD-10-CM | POA: Insufficient documentation

## 2015-07-05 DIAGNOSIS — Z951 Presence of aortocoronary bypass graft: Secondary | ICD-10-CM | POA: Diagnosis not present

## 2015-07-05 DIAGNOSIS — Z862 Personal history of diseases of the blood and blood-forming organs and certain disorders involving the immune mechanism: Secondary | ICD-10-CM | POA: Diagnosis not present

## 2015-07-05 DIAGNOSIS — M40209 Unspecified kyphosis, site unspecified: Secondary | ICD-10-CM | POA: Insufficient documentation

## 2015-07-05 DIAGNOSIS — Z7982 Long term (current) use of aspirin: Secondary | ICD-10-CM | POA: Diagnosis not present

## 2015-07-05 DIAGNOSIS — Z8701 Personal history of pneumonia (recurrent): Secondary | ICD-10-CM | POA: Insufficient documentation

## 2015-07-05 DIAGNOSIS — Z9981 Dependence on supplemental oxygen: Secondary | ICD-10-CM | POA: Diagnosis not present

## 2015-07-05 DIAGNOSIS — R0602 Shortness of breath: Secondary | ICD-10-CM | POA: Diagnosis present

## 2015-07-05 DIAGNOSIS — Z87448 Personal history of other diseases of urinary system: Secondary | ICD-10-CM | POA: Insufficient documentation

## 2015-07-05 DIAGNOSIS — M1990 Unspecified osteoarthritis, unspecified site: Secondary | ICD-10-CM | POA: Diagnosis not present

## 2015-07-05 DIAGNOSIS — Z8619 Personal history of other infectious and parasitic diseases: Secondary | ICD-10-CM | POA: Diagnosis not present

## 2015-07-05 DIAGNOSIS — M810 Age-related osteoporosis without current pathological fracture: Secondary | ICD-10-CM | POA: Diagnosis not present

## 2015-07-05 DIAGNOSIS — G4733 Obstructive sleep apnea (adult) (pediatric): Secondary | ICD-10-CM | POA: Insufficient documentation

## 2015-07-05 DIAGNOSIS — K219 Gastro-esophageal reflux disease without esophagitis: Secondary | ICD-10-CM | POA: Insufficient documentation

## 2015-07-05 DIAGNOSIS — J385 Laryngeal spasm: Secondary | ICD-10-CM

## 2015-07-05 DIAGNOSIS — J45901 Unspecified asthma with (acute) exacerbation: Secondary | ICD-10-CM | POA: Insufficient documentation

## 2015-07-05 DIAGNOSIS — I10 Essential (primary) hypertension: Secondary | ICD-10-CM | POA: Insufficient documentation

## 2015-07-05 DIAGNOSIS — E559 Vitamin D deficiency, unspecified: Secondary | ICD-10-CM | POA: Diagnosis not present

## 2015-07-05 DIAGNOSIS — Z79899 Other long term (current) drug therapy: Secondary | ICD-10-CM | POA: Diagnosis not present

## 2015-07-05 LAB — COMPREHENSIVE METABOLIC PANEL
A/G RATIO: 1.9 (ref 1.1–2.5)
ALBUMIN: 4.2 g/dL (ref 3.5–4.8)
ALK PHOS: 56 IU/L (ref 39–117)
ALT: 17 IU/L (ref 0–44)
AST: 22 IU/L (ref 0–40)
BILIRUBIN TOTAL: 1.6 mg/dL — AB (ref 0.0–1.2)
BUN / CREAT RATIO: 18 (ref 10–22)
BUN: 17 mg/dL (ref 8–27)
CHLORIDE: 98 mmol/L (ref 96–106)
CO2: 23 mmol/L (ref 18–29)
Calcium: 8.5 mg/dL — ABNORMAL LOW (ref 8.6–10.2)
Creatinine, Ser: 0.96 mg/dL (ref 0.76–1.27)
GFR calc Af Amer: 88 mL/min/{1.73_m2} (ref >59)
GFR calc non Af Amer: 76 mL/min/{1.73_m2} (ref >59)
GLOBULIN, TOTAL: 2.2 g/dL (ref 1.5–4.5)
GLUCOSE: 102 mg/dL — AB (ref 65–99)
Potassium: 4.4 mmol/L (ref 3.5–5.2)
SODIUM: 136 mmol/L (ref 134–144)
Total Protein: 6.4 g/dL (ref 6.0–8.5)

## 2015-07-05 LAB — LIPID PANEL
CHOLESTEROL TOTAL: 111 mg/dL (ref 100–199)
Chol/HDL Ratio: 2.1 ratio units (ref 0.0–5.0)
HDL: 52 mg/dL (ref >39)
LDL Calculated: 48 mg/dL (ref 0–99)
Triglycerides: 55 mg/dL (ref 0–149)
VLDL Cholesterol Cal: 11 mg/dL (ref 5–40)

## 2015-07-05 MED ORDER — ALBUTEROL SULFATE HFA 108 (90 BASE) MCG/ACT IN AERS
2.0000 | INHALATION_SPRAY | Freq: Once | RESPIRATORY_TRACT | Status: AC
  Administered 2015-07-05: 2 via RESPIRATORY_TRACT
  Filled 2015-07-05: qty 6.7

## 2015-07-05 MED ORDER — AEROCHAMBER PLUS W/MASK MISC
1.0000 | Freq: Once | Status: AC
  Administered 2015-07-05: 1
  Filled 2015-07-05: qty 1

## 2015-07-05 NOTE — ED Provider Notes (Signed)
CSN: [id number]     Arrival date & time 07/05/15  [id number] History   First MD Initiated Contact with Patient 07/05/15 [phone]     Chief Complaint  Patient presents with  . Shortness of Breath     (Consider location/radiation/quality/duration/timing/severity/associated sxs/prior Treatment) HPI  Patient is a 80 year old male with history of hypertension, GERD, sleep apnea presenting today with symptoms of vocal chord spasm. Patient states that this happens to him occasionally throughout the year for a "long period of time". He reports that he likes to take his wife's albuterol to help with the symptoms. He states the symptoms occur all of a sudden after coughing and he feels like he has a spasm in his vocal cords. His wife says she has him try to calm down and as he stops panicking his symptoms dissipate.  Patient had no fever, no productive cough. No fever nausea vomiting or diarrhea.   Past Medical History  Diagnosis Date  . Unspecified essential hypertension   . Esophageal reflux   . Hypersomnia with sleep apnea, unspecified   . Asthma   . Hyperlipidemia   . CAD (coronary artery disease)   . Enlarged prostate   . Kyphosis   . Trigger finger (acquired)   . Anemia, unspecified   . Lumbago   . Senile osteoporosis   . Unspecified vitamin D deficiency   . Elevated prostate specific antigen (PSA)   . Herpes zoster without mention of complication   . Acute gastric ulcer without mention of hemorrhage, perforation, or obstruction   . Impotence of organic origin   . Allergic rhinitis, cause unspecified   . Extrinsic asthma, unspecified   . Diaphragmatic hernia without mention of obstruction or gangrene   . Blood transfusion without reported diagnosis   . Unspecified sleep apnea     CPAP- settings 4-12   . Obstructive sleep apnea (adult) (pediatric)   . Pneumonia     hx of years ago   . Arthritis    Past Surgical History  Procedure Laterality Date  . Tonsillectomy  1945  . Shoulder  surgery Left 04/1998    Dr French Ana  . Mole removal  1958  . Cardiac catheterization  03/04/2009    Dr [name]  . Colonoscopy    . Coronary artery bypass graft  2010  . Transurethral resection of prostate N/A 09/24/2013    Procedure: TRANSURETHRAL RESECTION OF THE PROSTATE (TURP) WITH GYRUS (STAGED RIGHT LATERAL AND MEDIAN LOBE);  Surgeon: Ailene Rud, MD;  Location: [facility];  Service: Urology;  Laterality: N/A;  . Cystoscopy with retrograde pyelogram, ureteroscopy and stent placement Right 10/04/2013    Procedure: [place],  AND STENT PLACEMENT;  Surgeon: Festus Aloe, MD;  Location: [facility];  Service: Urology;  Laterality: Right;   Family History  Problem Relation Age of Onset  . Stroke Mother   . Hypertension Father   . Stroke Father   . Heart disease Father   . Heart disease Paternal Grandfather   . Colon cancer Neg Hx    Social History  Substance Use Topics  . Smoking status: Never Smoker   . Smokeless tobacco: Never Used  . Alcohol Use: No    Review of Systems  Constitutional: Negative for fever, activity change and fatigue.  HENT: Negative for congestion and ear pain.   Respiratory: Positive for cough and choking. Negative for shortness of breath.   Cardiovascular: Negative for chest pain.  Gastrointestinal: Negative for abdominal pain.  Genitourinary: Negative  for dysuria.  Skin: Negative for rash.  Neurological: Negative for dizziness and light-headedness.  Psychiatric/Behavioral: Negative for agitation.      Allergies  Compazine  Home Medications   Prior to Admission medications   Medication Sig Start Date End Date Taking? Authorizing Provider  acetaminophen (TYLENOL) 500 MG tablet Take 1,000 mg by mouth every 6 (six) hours as needed for mild pain or moderate pain.    Historical Provider, MD  aspirin 325 MG EC tablet Take 325 mg by mouth daily.    Historical Provider, MD  atenolol (TENORMIN) 50 MG tablet TAKE 1  TABLET BY MOUTH EVERY DAY TO CONTROL HEART RHYTHM AND LOWER BLOOD PRESSURE 12/28/14   Estill Dooms, MD  Calcium Citrate 250 MG TABS Take 250 tablets by mouth daily.     Historical Provider, MD  chlorpheniramine-HYDROcodone (TUSSIONEX PENNKINETIC ER) 10-8 MG/5ML LQCR Take 5 mLs by mouth every 12 (twelve) hours as needed. 06/27/14   Shanon Rosser, MD  cholecalciferol (VITAMIN D) 1000 UNITS tablet Take 1,000 Units by mouth daily.      Historical Provider, MD  diphenhydrAMINE (BENADRYL) 25 mg capsule Take 50 mg by mouth every 6 (six) hours as needed for allergies.    Historical Provider, MD  doxazosin (CARDURA) 4 MG tablet One daily to help control BP 11/02/14   Estill Dooms, MD  finasteride (PROSCAR) 5 MG tablet Take 5 mg by mouth every morning.    Historical Provider, MD  FLUVIRIN 0.5 ML SUSY ADM 0.5ML IM UTD 02/21/15   Historical Provider, MD  Multiple Vitamin (MULTIVITAMIN) tablet Take 1 tablet by mouth daily.      Historical Provider, MD  omeprazole (PRILOSEC) 20 MG capsule TAKE ONE CAPSULE BY MOUTH DAILY TO REDUCE STOMACH ACID AND PROTECT ESOPHAGUS 06/09/14   Estill Dooms, MD  Potassium 99 MG TABS Take 99 mg by mouth daily.    Historical Provider, MD  Probiotic Product (PROBIOTIC DAILY PO) Take 2 capsules by mouth daily.     Historical Provider, MD  rosuvastatin (CRESTOR) 20 MG tablet TAKE ONE TABLET BY MOUTH ONCE DAILY FOR CHOLESTEROL 05/16/15   Estill Dooms, MD   BP 119/55 mmHg  Pulse 83  Temp(Src) 100.1 F (37.8 C) (Oral)  Resp 22  Ht 5\' 6"  (1.676 m)  Wt 155 lb (70.308 kg)  BMI 25.03 kg/m2  SpO2 99% Physical Exam  Constitutional: He is oriented to person, place, and time. He appears well-nourished.  HENT:  Head: Normocephalic.  Mouth/Throat: Oropharynx is clear and moist.  Eyes: Conjunctivae are normal.  Neck: No tracheal deviation present.  Cardiovascular: Normal rate.   Pulmonary/Chest: Effort normal. No stridor. No respiratory distress.  Scar to chest Kyphosis.  Abdominal:  Soft. There is no tenderness. There is no guarding.  Musculoskeletal: Normal range of motion. He exhibits no edema.  Neurological: He is oriented to person, place, and time. No cranial nerve deficit.  Skin: Skin is warm and dry. No rash noted. He is not diaphoretic.  Psychiatric: He has a normal mood and affect. His behavior is normal.  Nursing note and vitals reviewed.   ED Course  Procedures (including critical care time) Labs Review Labs Reviewed - No data to display  Imaging Review Dg Chest 2 View  07/05/2015  CLINICAL DATA:  Cough and shortness of breath for 2 days, initial encounter EXAM: CHEST  2 VIEW COMPARISON:  06/26/2014 FINDINGS: Cardiac shadow is stable. Postsurgical changes are again seen. No focal infiltrate or sizable effusion is noted.  Some minimal scarring is noted in the bases bilaterally. IMPRESSION: No active cardiopulmonary disease. Electronically Signed   By: Inez Catalina M.D.   On: 07/05/2015 10:48   I have personally reviewed and evaluated these images and lab results as part of my medical decision-making.   EKG Interpretation None      MDM   Final diagnoses:  Spasm of vocal cords   is a 80 year old male with history of hypertension GERD check of sleep apnea presenting today with symptoms of vocal cord spasm. Patient states this occasionally happens to him throughout the year. Patient's wife reports that it improves as he calms down. This is either anxiety reaction versus focal cord spasm. Patient had a "attack" while I was in the room. It consisted of a cough followed by a couple stridorous breaths with anxiety. As I comforted patient he was able to calm down and it completely resolved.  I will refer him to ENT. However patient's saturation never decreased and lasted less than 10 seconds which he says they often do. He also had this for a number of years. Esophageal spasm vs vocal cord spasm. Therefore do not think it represents acute pathology needing  intervention at this time. We will get chest x-ray to rule out pneumonia. Otherwise a plan to have him follow up with PCP and ENT as an outpatient.   Jaran Sainz Julio Alm, MD 07/05/15 1141

## 2015-07-05 NOTE — ED Notes (Signed)
MD at bedside. 

## 2015-07-05 NOTE — Discharge Instructions (Signed)
Please return immediately with any difficulty breathing.  Please follow up with ENT for your other concerns.  Number is attached.

## 2015-07-05 NOTE — ED Notes (Signed)
Pt reports "I feel like my vocal cords close up and it takes a while to get some breath in..." pt denies any cp, reports "achy all over" and cough x Sunday.

## 2015-07-05 NOTE — ED Notes (Signed)
D/c home with family. No new Rx given. Ambulatory with steady gait

## 2015-07-06 ENCOUNTER — Encounter: Payer: Self-pay | Admitting: Internal Medicine

## 2015-07-06 ENCOUNTER — Ambulatory Visit: Payer: Self-pay | Admitting: Internal Medicine

## 2015-07-07 ENCOUNTER — Encounter: Payer: Self-pay | Admitting: Internal Medicine

## 2015-07-07 ENCOUNTER — Ambulatory Visit (INDEPENDENT_AMBULATORY_CARE_PROVIDER_SITE_OTHER): Payer: BLUE CROSS/BLUE SHIELD | Admitting: Internal Medicine

## 2015-07-07 VITALS — BP 102/60 | HR 69 | Temp 98.1°F | Ht 67.0 in | Wt 148.0 lb

## 2015-07-07 DIAGNOSIS — J111 Influenza due to unidentified influenza virus with other respiratory manifestations: Secondary | ICD-10-CM

## 2015-07-07 DIAGNOSIS — G4733 Obstructive sleep apnea (adult) (pediatric): Secondary | ICD-10-CM

## 2015-07-07 DIAGNOSIS — K219 Gastro-esophageal reflux disease without esophagitis: Secondary | ICD-10-CM | POA: Diagnosis not present

## 2015-07-07 MED ORDER — OSELTAMIVIR PHOSPHATE 75 MG PO CAPS: 75.0000 mg | ORAL_CAPSULE | Freq: Two times a day (BID) | ORAL | Status: DC

## 2015-07-07 MED ORDER — TRAMADOL HCL 50 MG PO TABS: 50.0000 mg | ORAL_TABLET | Freq: Four times a day (QID) | ORAL | Status: DC | PRN

## 2015-07-07 NOTE — Progress Notes (Signed)
Subjective:    Patient ID: Brent Griffith, male    DOB: 09-Jun-1935, 80 y.o.   MRN: [id number]  HPI 12/08/10- 80 year old male never smoker, followed for obstructive sleep apnea, periodic limb movement, CAD/ CABG // Dr Brent Griffith for allergic asthma Last here- November 10, 2009- note reviewed CPAP comfortable, used on trips and all night every night-AutoPAP Advanced, full face mask. Wife likes it because it prevents snoring. Feels well rested. Usual bedtime 12MN and up around 5- he accepts responsibility to get enough sleep. Denies EDS driving or etc. No needs for changes. He gets supplies ok. We discussed mask options. Heart surgery Nov 2010 CABG 4v  03/17/12- 80 year old male never smoker, followed for obstructive sleep apnea, periodic limb movement, CAD/ CABG // Dr Brent Griffith for allergic asthma  Wears CPAP AutoPap/ Advanced nightly 6-8hrs during week and 10-12 hours on the weekends. Denies problems with pressure and mask. Sometimes pressure is too high or too low Family tells him he will snore through very rarely.  04/09/13- 80 year old male never smoker, followed for obstructive sleep apnea, periodic limb movement, CAD/ CABG // Dr Brent Griffith for allergic asthma  Wife here. FOLLOWS FOR:  Wear CPAP 6-8 hours per night.  Discuss getting CPAP settings change Good compliance and control, but he would like to try a lower pressure especially to start at night. Using autoPAP 8-15. Pending prostatectomy.  04/15/14-  80 year old male never smoker, followed for obstructive sleep apnea, periodic limb movement, CAD/ CABG // Dr Brent Griffith for allergic asthma  Wife here. FOLLOWS FOR: Patient wears CPAP Auto 8-15 ( He thinks 4-12)/ Advanced  6-7 hrs each night.Patient was in hospital 1 month ago. Patient reports trouble with power button. He is having a cough with clear mucus. No sob or wheeze. Hosp November with acute bronchitis. Now occasional SOB w some cough. CXR 03/19/14 IMPRESSION: Hyperinflation of the lungs. This could be  the secondary to asthma or COPD related to smoking. No airspace disease or other acute findings are seen. Prior CABG. Electronically Signed  By: Brent Griffith M.D.  On: 03/19/2014 20:17  07/15/14-80 year old male never smoker, followed for obstructive sleep apnea, periodic limb movement, CAD/ CABG // Dr Brent Griffith for allergic asthma  Wife here. CPAP auto 6-12/ Advanced FOLLOWS FOR: Pt took CPAP to [facility] today to have DL taken care of. Wears CPAP every night for about 5-8 hours(as long as he can sleep);pressure working well for patient as well. He likes auto titration and has been very compliant with his CPAP. His old machine is worn out and buttons are falling off. PFT: 05/04/2014: Normal spirometry flows, insignificant response to bronchodilator, moderate diffusion defect 59% of predicted, possible air trapping. CXR 06/26/14 IMPRESSION: No acute cardiopulmonary disease. Electronically Signed  By: Brent Griffith M.D.  On: 06/26/2014 21:50  07/07/2015-80 year old male never smoker followed for OSA, periodic limb movement, complicated by CAD/CABG, Dr Brent Griffith for allergic asthma    Wife here               Had flu shot this fall CPAP auto 6-12/Advanced ED visit 07/05/15- laryngospasm-referred to ENT CXR 07/05/2015-NAD He thinks he and his wife have both had a flu like illness. 2 or 3 days ago onset with initial temperature 100.2, myalgias, dry cough. This cough was associated with laryngospasm event for which he went to ER. Has appointment pending with Dr. Lovenia Griffith occasional sensation of esophageal spasm but not clearly noting reflux events. Says only Tussionex has helped his cough, benzonatate does not.  Review of Systems- see  HPI Constitutional:   No-   weight loss, night sweats, fevers, chills, fatigue, lassitude. HEENT:   No-   headaches, difficulty swallowing, tooth/dental problems, sore throat,                  No-   sneezing, itching, ear ache, nasal congestion, post nasal drip,  CV:   No-   chest pain, orthopnea, PND, swelling in lower extremities, anasarca,dizziness, palpitations GI:  No-   heartburn, indigestion, abdominal pain, nausea, vomiting,  Resp: +  shortness of breath with exertion or at rest.  No-  excess mucus,             No-   productive cough,  + non-productive cough,  No-  coughing up of blood.              No-   change in color of mucus.  No- wheezing.   Skin: No-   rash or lesions. GU: . MS:  No-   joint pain or swelling. Marland Kitchen Psych:  No- change in mood or affect. No depression or anxiety.  No memory loss.    Objective:   General- Alert, Oriented, Affect-appropriate, Distress- none acute. Medium build. Skin- rash-none, lesions- none, excoriation- none Lymphadenopathy- none Head- atraumatic            Eyes- Gross vision intact, PERRLA, conjunctivae clear secretions            Ears- Hearing, canals normal            Nose- Clear, +Septal dev/ external nasal deviation,  No-mucus, polyps, erosion, perforation             Throat- Mallampati II-III , mucosa clear , drainage- none, tonsils- atrophic Neck- flexible , trachea midline, no stridor , thyroid nl, carotid no bruit Chest - symmetrical excursion , unlabored           Heart/CV- RRR , no murmur , no gallop  , no rub, nl s1 s2                           - JVD- none , edema- none, stasis changes- none, varices- none           Lung- clear to P&A, wheeze- none, cough + one episode while here , dullness-none, rub- none           Chest wall-  Abd-  Br/ Gen/ Rectal- Not done, not indicated Extrem- cyanosis- none, clubbing, none, atrophy- none, strength- nl Neuro- grossly intact to observation    Assessment & Plan:

## 2015-07-07 NOTE — Assessment & Plan Note (Signed)
Did have flu vaccine but recent symptoms consistent with acute viral syndrome, possibly flu. Plan-Tamiflu, hydration. Tramadol for cough if needed

## 2015-07-07 NOTE — Assessment & Plan Note (Signed)
Suspect this may contribute to episodic laryngospasm. Reflux precautions emphasized

## 2015-07-07 NOTE — Patient Instructions (Signed)
Script for Tamiflu  Continue to stay well hydrated, avoid chilling  Script to try Tramadol for cough. It can also help joint/ arthritis pain

## 2015-07-07 NOTE — Assessment & Plan Note (Signed)
Download confirms excellent compliance and control at this visit. Wife confirms good control of snoring. He is satisfied with CPAP and sleeps better with it.

## 2015-07-13 ENCOUNTER — Ambulatory Visit (INDEPENDENT_AMBULATORY_CARE_PROVIDER_SITE_OTHER): Payer: BLUE CROSS/BLUE SHIELD | Admitting: Internal Medicine

## 2015-07-13 ENCOUNTER — Encounter: Payer: Self-pay | Admitting: Internal Medicine

## 2015-07-13 VITALS — BP 110/68 | HR 62 | Temp 97.7°F | Resp 20 | Ht 67.0 in | Wt 150.2 lb

## 2015-07-13 DIAGNOSIS — M810 Age-related osteoporosis without current pathological fracture: Secondary | ICD-10-CM

## 2015-07-13 DIAGNOSIS — I10 Essential (primary) hypertension: Secondary | ICD-10-CM

## 2015-07-13 DIAGNOSIS — J111 Influenza due to unidentified influenza virus with other respiratory manifestations: Secondary | ICD-10-CM

## 2015-07-13 DIAGNOSIS — E7800 Pure hypercholesterolemia, unspecified: Secondary | ICD-10-CM

## 2015-07-13 DIAGNOSIS — K219 Gastro-esophageal reflux disease without esophagitis: Secondary | ICD-10-CM

## 2015-07-13 MED ORDER — HYDROCOD POLST-CPM POLST ER 10-8 MG/5ML PO SUER: ORAL | Status: DC

## 2015-07-13 NOTE — Progress Notes (Signed)
Patient ID: Brent Griffith, male   DOB: 05/16/1935, 80 y.o.   MRN: [id number]    Facility  [facility]    Place of Service:   OFFICE    Allergies  Allergen Reactions  . Compazine [Prochlorperazine Edisylate] Anxiety    Chief Complaint  Patient presents with  . Medical Management of Chronic Issues    cough,    HPI:  Last seen at [facility] 03/02/2015 for evaluation of the following problems: Hypertension, hyperlipidemia, GERD, atherosclerosis and coronary artery, and diarrhea. Patient was advised to continue Imodium for his diarrhea. Other medications remained unchanged.  Since the visit on 03/02/2015 his insurance company said they would no longer cover Crestor, so he was switched to atorvastatin on 04/15/2015. He says he never got this message and he continues on Crestor.  He was seen in the emergency department 05/03/2016 for vocal cord spasm and coughing. Emergency room note suggests that he would be referred to ENT.  On 3-17 he visited with Dr. Deneise Lever, pulmonary specialist for evaluation of his obstructive sleep apnea. and laryngeal spasm. Reflux precautions were emphasized. Dr. Annamaria Boots thought he might have the flu and gave him Tamiflu and recommended hydration.  Dr. [name] is treating his OP and has him on Fosamax until 2021. Insurance refused to pay for Prolia.  Medications: Patient's Medications  New Prescriptions   No medications on file  Previous Medications   ACETAMINOPHEN (TYLENOL) 500 MG TABLET    Take 1,000 mg by mouth every 6 (six) hours as needed for mild pain or moderate pain.   ALBUTEROL (PROVENTIL HFA;VENTOLIN HFA) 108 (90 BASE) MCG/ACT INHALER    Inhale 1-2 puffs into the lungs every 6 (six) hours as needed for wheezing or shortness of breath.   ALENDRONATE (FOSAMAX) 70 MG TABLET    Take 1 tablet by mouth once a week.   ASPIRIN 325 MG EC TABLET    Take 325 mg by mouth daily.   ATENOLOL (TENORMIN) 50 MG TABLET    TAKE 1 TABLET BY MOUTH EVERY DAY TO  CONTROL HEART RHYTHM AND LOWER BLOOD PRESSURE   CALCIUM CITRATE 250 MG TABS    Take 250 tablets by mouth daily.    CHLORPHENIRAMINE-HYDROCODONE (TUSSIONEX PENNKINETIC ER) 10-8 MG/5ML LQCR    Take 5 mLs by mouth every 12 (twelve) hours as needed.   CHOLECALCIFEROL (VITAMIN D) 1000 UNITS TABLET    Take 1,000 Units by mouth daily.     DIPHENHYDRAMINE (BENADRYL) 25 MG CAPSULE    Take 50 mg by mouth every 6 (six) hours as needed for allergies.   DOXAZOSIN (CARDURA) 4 MG TABLET    One daily to help control BP   FINASTERIDE (PROSCAR) 5 MG TABLET    Take 5 mg by mouth every morning.   MULTIPLE VITAMIN (MULTIVITAMIN) TABLET    Take 1 tablet by mouth daily.     OMEPRAZOLE (PRILOSEC) 20 MG CAPSULE    TAKE ONE CAPSULE BY MOUTH DAILY TO REDUCE STOMACH ACID AND PROTECT ESOPHAGUS   OSELTAMIVIR (TAMIFLU) 75 MG CAPSULE    Take 1 capsule (75 mg total) by mouth 2 (two) times daily.   POTASSIUM 99 MG TABS    Take 99 mg by mouth daily.   PROBIOTIC PRODUCT (PROBIOTIC DAILY PO)    Take 2 capsules by mouth daily.    ROSUVASTATIN (CRESTOR) 20 MG TABLET    TAKE ONE TABLET BY MOUTH ONCE DAILY FOR CHOLESTEROL   TRAMADOL (ULTRAM) 50 MG TABLET  Take 1 tablet (50 mg total) by mouth every 6 (six) hours as needed.  Modified Medications   No medications on file  Discontinued Medications   FLUVIRIN 0.5 ML SUSY    ADM 0.5ML IM UTD    Review of Systems  Constitutional: Negative.   HENT: Negative.   Eyes: Negative.   Respiratory: Positive for cough.        Wears CPAP. Followed by Dr. Annamaria Boots.  Cardiovascular: Negative for chest pain, palpitations and leg swelling.  Gastrointestinal: Positive for diarrhea.  Endocrine: Negative.   Musculoskeletal:       History osteoporosis.  Skin: Negative.   Allergic/Immunologic: Negative.   Neurological: Negative.   Hematological: Negative.   Psychiatric/Behavioral: Negative.     Filed Vitals:   07/13/15 1245  BP: 110/68  Pulse: 62  Temp: 97.7 F (36.5 C)  TempSrc: Oral    Resp: 20  Height: _0  (1.702 m)  Weight: 150 lb 3.2 oz (68.13 kg)  SpO2: 97%   Body mass index is 23.52 kg/(m^2). Filed Weights   07/13/15 1245  Weight: 150 lb 3.2 oz (68.13 kg)     Physical Exam  Constitutional: He is oriented to person, place, and time. He appears well-developed and well-nourished. No distress.  HENT:  Head: Atraumatic.  Right Ear: External ear normal.  Left Ear: External ear normal.  Nose: Nose normal.  Head congestion  Eyes: Conjunctivae and EOM are normal. Pupils are equal, round, and reactive to light.  Neck: Normal range of motion. Neck supple. No JVD present. No tracheal deviation present. No thyromegaly present.  Cardiovascular: Normal rate, regular rhythm, normal heart sounds and intact distal pulses.  Exam reveals no gallop and no friction rub.   No murmur heard. Pulmonary/Chest: Effort normal and breath sounds normal. No respiratory distress. He has no wheezes. He has no rales. He exhibits no tenderness.  Abdominal: Soft. Bowel sounds are normal. He exhibits no distension and no mass. There is no tenderness. There is no rebound.  Musculoskeletal: Normal range of motion. He exhibits no edema or tenderness.  Lymphadenopathy:    He has no cervical adenopathy.  Neurological: He is alert and oriented to person, place, and time. He has normal reflexes. No cranial nerve deficit. Coordination normal.  Skin: Skin is warm and dry. No rash noted. No erythema. No pallor.  Psychiatric: He has a normal mood and affect. His behavior is normal. Judgment and thought content normal.    Labs reviewed: Lab Summary Latest Ref Rng 07/04/2015 10/27/2014 08/10/2014  Hemoglobin 13.5 - 17.5 g/dL (None) (None) 12.5(A)  Hematocrit 41 - 53 % (None) (None) 37(A)  White count - (None) (None) 6.0  Platelet count 150 - 399 K/L (None) (None) 160  Sodium 134 - 144 mmol/L 136 138 139  Potassium 3.5 - 5.2 mmol/L 4.4 4.4 4.5  Calcium 8.6 - 10.2 mg/dL 8.5(L) 9.6 (None)  Phosphorus -  (None) (None) (None)  Creatinine 0.76 - 1.27 mg/dL 0.96 0.88 0.8  AST 0 - 40 IU/L 22 (None) 20  Alk Phos 39 - 117 IU/L 56 (None) (None)  Bilirubin 0.0 - 1.2 mg/dL 1.6(H) (None) (None)  Glucose 65 - 99 mg/dL 102(H) 100(H) 74  Cholesterol 0 - 200 mg/dL (None) (None) 117  HDL cholesterol >39 mg/dL 52 50 58  Triglycerides 0 - 149 mg/dL 55 79 68  LDL Direct - (None) (None) (None)  LDL Calc 0 - 99 mg/dL 48 57 45  Total protein - (None) (None) (None)  Albumin 3.5 -  4.8 g/dL 4.2 (None) (None)   No results found for: TSH, T3TOTAL, T4TOTAL, THYROIDAB Lab Results  Component Value Date   BUN 17 07/04/2015   BUN 18 10/27/2014   BUN 16 08/10/2014   Lab Results  Component Value Date   HGBA1C 5.6 07/28/2012   HGBA1C  03/04/2009    6.1 (NOTE) The ADA recommends the following therapeutic goal for glycemic control related to Hgb A1c measurement: Goal of therapy: <6.5 Hgb A1c  Reference: American Diabetes Association: Clinical Practice Recommendations 2010, Diabetes Care, 2010, 33: (Suppl  1).    Assessment/Plan  1. Essential hypertension - Comprehensive metabolic panel; Future  2. Flu syndrome - chlorpheniramine-HYDROcodone (TUSSIONEX PENNKINETIC ER) 10-8 MG/5ML SUER; 5 cc every 12 hours if needed for cough  Dispense: 140 mL; Refill: 0  3. Gastroesophageal reflux disease, esophagitis presence not specified Continue omeprazole  4. Pure hypercholesterolemia Continue Crestor as long as his company will approve the prescriptions. Switched to atorvastatin when approval becomes a problem.   5. Senile osteoporosis Continue alendronate

## 2015-07-15 ENCOUNTER — Ambulatory Visit: Payer: BLUE CROSS/BLUE SHIELD | Admitting: Internal Medicine

## 2015-07-19 ENCOUNTER — Encounter: Payer: Self-pay | Admitting: Internal Medicine

## 2015-07-20 DIAGNOSIS — R053 Chronic cough: Secondary | ICD-10-CM | POA: Insufficient documentation

## 2015-07-20 DIAGNOSIS — J385 Laryngeal spasm: Secondary | ICD-10-CM | POA: Insufficient documentation

## 2015-07-20 DIAGNOSIS — R05 Cough: Secondary | ICD-10-CM | POA: Insufficient documentation

## 2015-07-21 ENCOUNTER — Encounter: Payer: Self-pay | Admitting: Internal Medicine

## 2015-07-21 ENCOUNTER — Telehealth: Payer: Self-pay | Admitting: *Deleted

## 2015-07-21 NOTE — Telephone Encounter (Signed)
Letter written and should be on printer.

## 2015-07-21 NOTE — Telephone Encounter (Signed)
Patient wants to join a Runner, broadcasting/film/video program, available for employees of [employer] where he works. The program last for 1 year. Patient states his goal is to achieve a weight loss to 135-140 pounds and to improve his physical strength. Patient states that since he is over 80 years of age he needs Dr. Rolly Salter Approval for a calorie restricted diet. The program will include a weight scale, food scale, measuring cups, food lists, food diary and information for safe and effective weight loss. If anymore information is needed [place] call [place] at [phone] Would like the letter faxed to [employer] at Fax #: [phone]  Patient states the letter must state: This program will be Safe and Appropriate for Brent Griffith to participate in.

## 2015-07-22 NOTE — Telephone Encounter (Signed)
Letter faxed.

## 2015-09-07 ENCOUNTER — Ambulatory Visit (INDEPENDENT_AMBULATORY_CARE_PROVIDER_SITE_OTHER): Payer: BLUE CROSS/BLUE SHIELD | Admitting: Cardiology

## 2015-09-07 ENCOUNTER — Encounter: Payer: Self-pay | Admitting: Cardiology

## 2015-09-07 VITALS — BP 130/68 | HR 56 | Ht 67.0 in | Wt 146.0 lb

## 2015-09-07 DIAGNOSIS — Z951 Presence of aortocoronary bypass graft: Secondary | ICD-10-CM

## 2015-09-07 DIAGNOSIS — J439 Emphysema, unspecified: Secondary | ICD-10-CM | POA: Insufficient documentation

## 2015-09-07 DIAGNOSIS — R002 Palpitations: Secondary | ICD-10-CM | POA: Insufficient documentation

## 2015-09-07 DIAGNOSIS — G4733 Obstructive sleep apnea (adult) (pediatric): Secondary | ICD-10-CM | POA: Diagnosis not present

## 2015-09-07 DIAGNOSIS — J438 Other emphysema: Secondary | ICD-10-CM | POA: Diagnosis not present

## 2015-09-07 DIAGNOSIS — K219 Gastro-esophageal reflux disease without esophagitis: Secondary | ICD-10-CM

## 2015-09-07 DIAGNOSIS — E785 Hyperlipidemia, unspecified: Secondary | ICD-10-CM

## 2015-09-07 DIAGNOSIS — I10 Essential (primary) hypertension: Secondary | ICD-10-CM | POA: Diagnosis not present

## 2015-09-07 NOTE — Assessment & Plan Note (Signed)
On C-pap 

## 2015-09-07 NOTE — Assessment & Plan Note (Signed)
Dr Annamaria Boots follows Significant kyphosis on exam

## 2015-09-07 NOTE — Assessment & Plan Note (Signed)
I suggested he take Omeprazole on an empty stomach

## 2015-09-07 NOTE — Assessment & Plan Note (Signed)
Controlled.  

## 2015-09-07 NOTE — Assessment & Plan Note (Signed)
Pt has noted recent palpitations at night, no tachycardia, just skipped beats

## 2015-09-07 NOTE — Patient Instructions (Signed)
Medication Instructions:  START TAKING YOUR ATENOLOL IN THE EVENING INSTEAD OF EARLIER IN THE DAY  Labwork: NONE  Testing/Procedures: NONE  Follow-Up: Your physician wants you to follow-up in: [place] will receive a reminder letter in the mail two months in advance. If you don't receive a letter, please call our office to schedule the follow-up appointment.  Any Other Special Instructions Will Be Listed Below (If Applicable). AVOID CAFFEINE   If you need a refill on your cardiac medications before your next appointment, please call your pharmacy.

## 2015-09-07 NOTE — Assessment & Plan Note (Signed)
CABG x 10 Mar 2009- L-LAD, S-RI, S-OM, S-PDA Myoview Jan 2015 - (presumably normal- I couldn't find the report) No angina

## 2015-09-07 NOTE — Progress Notes (Signed)
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 He had a Myoview in Jan 2015- I believe this was low risk but I could not locate the actual report in Epic. He has not had chest pain. Recently he had a URI. Since then he has noticed "skipped beats" at night. A Health Care provider apparently noted some skipped beats on exam. The pt denies any sustained tachycardia, syncope, or near syncope..    Current Outpatient Prescriptions  Medication Sig Dispense Refill  . acetaminophen (TYLENOL) 500 MG tablet Take 1,000 mg by mouth every 6 (six) hours as needed for mild pain or moderate pain.    Brent Griffith Kitchen albuterol (PROVENTIL HFA;VENTOLIN HFA) 108 (90 Base) MCG/ACT inhaler Inhale 1-2 puffs into the lungs every 6 (six) hours as needed for wheezing or shortness of breath.    Brent Griffith Kitchen alendronate (FOSAMAX) 70 MG tablet Take 1 tablet by mouth once a week.  3  . aspirin 325 MG EC tablet Take 325 mg by mouth daily.    Brent Griffith Kitchen atenolol (TENORMIN) 50 MG tablet TAKE 1 TABLET BY MOUTH EVERY DAY TO CONTROL HEART RHYTHM AND LOWER BLOOD PRESSURE (Patient taking differently: TAKE 1 TABLET BY MOUTH EVERY DAY TO CONTROL HEART RHYTHM AND LOWER BLOOD PRESSURE TAKE IN THE EVENING) 90 tablet 3  . Calcium Citrate 250 MG TABS Take 250 tablets by mouth daily.     . chlorpheniramine-HYDROcodone (TUSSIONEX PENNKINETIC ER) 10-8 MG/5ML SUER 5 cc every 12 hours if needed for cough 140 mL 0  . cholecalciferol (VITAMIN D) 1000 UNITS tablet Take 1,000 Units by mouth daily.      Brent Griffith Kitchen doxazosin (CARDURA) 4 MG tablet One daily to help control BP 90 tablet 4  . finasteride (PROSCAR) 5 MG tablet Take 5 mg by mouth every morning.    . Multiple Vitamin (MULTIVITAMIN) tablet Take 1 tablet by mouth daily.      Brent Griffith Kitchen omeprazole (PRILOSEC) 20 MG capsule TAKE ONE CAPSULE BY  MOUTH DAILY TO REDUCE STOMACH ACID AND PROTECT ESOPHAGUS 90 capsule 1  . Potassium 99 MG TABS Take 99 mg by mouth daily.    . Probiotic Product (PROBIOTIC DAILY PO) Take 2 capsules by mouth daily.     . rosuvastatin (CRESTOR) 20 MG tablet TAKE ONE TABLET BY MOUTH ONCE DAILY FOR CHOLESTEROL 90 tablet 2   No current facility-administered medications for this visit.    Allergies  Allergen Reactions  . Compazine [Prochlorperazine Edisylate] Anxiety    Social History   Social History  . Marital Status: Married    Spouse Name: N/A  . Number of Children: N/A  . Years of Education: N/A   Occupational History  . [employer]    Social History Main Topics  . Smoking status: Never Smoker   . Smokeless tobacco: Never Used  . Alcohol Use: No  . Drug Use: No  . Sexual Activity: Not on file   Other Topics Concern  . Not on file   Social History Narrative     Review of Systems: General: negative for chills, fever, night sweats or weight changes.  Cardiovascular: negative for chest pain, dyspnea on exertion, edema, orthopnea, palpitations, paroxysmal nocturnal dyspnea or shortness of breath Dermatological: negative for rash Respiratory: negative for cough or wheezing Urologic: negative  for hematuria Abdominal: negative for nausea, vomiting, diarrhea, bright red blood per rectum, melena, or hematemesis Neurologic: negative for visual changes, syncope, or dizziness All other systems reviewed and are otherwise negative except as noted above.    Blood pressure 130/68, pulse 56, height 5\' 7"  (1.702 m), weight 146 lb (66.225 kg).  General appearance: alert, cooperative and no distress Neck: no JVD Lungs: clear to auscultation bilaterally and kyphosis Heart: regular rate and rhythm Extremities: no edema Skin: Skin color, texture, turgor normal. No rashes or lesions Neurologic: Grossly normal  EKG NSR, SB-inferior TWI looks new, lateral TWI is old  ASSESSMENT AND  PLAN:   Palpitations Pt has noted recent palpitations at night, no tachycardia, just skipped beats  Hx of CABG CABG x 10 Mar 2009- L-LAD, S-RI, S-OM, S-PDA Myoview Jan 2015 - (presumably normal- I couldn't find the report) No angina  Essential hypertension Controlled  Dyslipidemia On high dose statin Rx, LDL 47 Feb 2017  COPD with emphysema ([place]) Dr Annamaria Boots follows Significant kyphosis on exam  Obstructive sleep apnea On C-pap  G E R D I suggested he take Omeprazole on an empty stomach   PLAN  The pt has had documented PVCs in the past. It does not sound like he has any sustained tachycardia. I suggested he take his Tenormin in the PM since he mainly notes palpitations at night and not during the day. F/U Dr Stanford Breed 6 months. I did not suggest an extra Tenormin 25 mg prn secondary to baseline bradycardia. I did suggest he avoid caffeine products.   Kerin Ransom K PA-C 09/07/2015 2:57 PM

## 2015-09-07 NOTE — Assessment & Plan Note (Signed)
On high dose statin Rx, LDL 47 Feb 2017

## 2015-11-10 ENCOUNTER — Other Ambulatory Visit: Payer: Self-pay | Admitting: *Deleted

## 2015-11-10 ENCOUNTER — Other Ambulatory Visit: Payer: Self-pay | Admitting: Internal Medicine

## 2015-11-10 DIAGNOSIS — I10 Essential (primary) hypertension: Secondary | ICD-10-CM

## 2015-11-14 ENCOUNTER — Other Ambulatory Visit: Payer: BLUE CROSS/BLUE SHIELD

## 2015-11-14 DIAGNOSIS — I10 Essential (primary) hypertension: Secondary | ICD-10-CM

## 2015-11-14 LAB — COMPREHENSIVE METABOLIC PANEL
ALT: 12 U/L (ref 9–46)
AST: 18 U/L (ref 10–35)
Albumin: 4.1 g/dL (ref 3.6–5.1)
Alkaline Phosphatase: 37 U/L — ABNORMAL LOW (ref 40–115)
BUN: 14 mg/dL (ref 7–25)
CHLORIDE: 107 mmol/L (ref 98–110)
CO2: 26 mmol/L (ref 20–31)
CREATININE: 0.73 mg/dL (ref 0.70–1.18)
Calcium: 8.9 mg/dL (ref 8.6–10.3)
GLUCOSE: 100 mg/dL — AB (ref 65–99)
POTASSIUM: 4.3 mmol/L (ref 3.5–5.3)
SODIUM: 140 mmol/L (ref 135–146)
TOTAL PROTEIN: 6.3 g/dL (ref 6.1–8.1)
Total Bilirubin: 1.5 mg/dL — ABNORMAL HIGH (ref 0.2–1.2)

## 2015-11-16 ENCOUNTER — Encounter: Payer: Self-pay | Admitting: Internal Medicine

## 2015-11-16 ENCOUNTER — Ambulatory Visit (INDEPENDENT_AMBULATORY_CARE_PROVIDER_SITE_OTHER): Payer: BLUE CROSS/BLUE SHIELD | Admitting: Internal Medicine

## 2015-11-16 VITALS — BP 122/68 | HR 64 | Temp 97.6°F | Ht 67.0 in | Wt 150.4 lb

## 2015-11-16 DIAGNOSIS — I10 Essential (primary) hypertension: Secondary | ICD-10-CM | POA: Diagnosis not present

## 2015-11-16 DIAGNOSIS — J438 Other emphysema: Secondary | ICD-10-CM

## 2015-11-16 DIAGNOSIS — J385 Laryngeal spasm: Secondary | ICD-10-CM | POA: Diagnosis not present

## 2015-11-16 DIAGNOSIS — R05 Cough: Secondary | ICD-10-CM

## 2015-11-16 DIAGNOSIS — R002 Palpitations: Secondary | ICD-10-CM

## 2015-11-16 DIAGNOSIS — R053 Chronic cough: Secondary | ICD-10-CM

## 2015-11-16 DIAGNOSIS — G4733 Obstructive sleep apnea (adult) (pediatric): Secondary | ICD-10-CM

## 2015-11-16 DIAGNOSIS — E785 Hyperlipidemia, unspecified: Secondary | ICD-10-CM | POA: Diagnosis not present

## 2015-11-16 DIAGNOSIS — K219 Gastro-esophageal reflux disease without esophagitis: Secondary | ICD-10-CM | POA: Diagnosis not present

## 2015-11-16 NOTE — Progress Notes (Signed)
Patient ID: Brent Griffith, male   DOB: 1935-10-22, 80 y.o.   MRN: [id number]    Facility  [facility]    Place of Service:   OFFICE    Allergies  Allergen Reactions  . Compazine [Prochlorperazine Edisylate] Anxiety    Chief Complaint  Patient presents with  . Medical Management of Chronic Issues    4 months follow up    HPI:  07/05/2015 seen in the emergency room for laryngeal spasm. Blood pressure is elevated 167/58, but returned to 119/55 prior to discharge. Later saw ENT, Dr. Wilburn Cornelia. Had layngoscopy. Was given tramadol.  07/07/2015 seen by Dr. C.D. Young. Follow-up of obstructive sleep apnea treated with CPAP. Compliance was noted to be very good control excellent. Patient was thought to have a flu syndrome at the time he was seen. Tamiflu was prescribed. Tramadol was given for cough.  07/13/2015 seen by me at [facility]. Blood pressure normal. Flu syndrome seemed be improving.  09/07/2015 seen by Kerin Ransom, PA with cardiology. Patient reported palpitations. Assumption was made that these were PVCs, which he has had in the past. Suggestion was made to take Tenormin in the evening. Patient returned Dr. Stanford Breed in 6 months. Was recommended that he avoid caffeine products. Suggestion was made to take omeprazole for GERD.  Palpitations and GERD have improved.  Medications: Patient's Medications  New Prescriptions   No medications on file  Previous Medications   ACETAMINOPHEN (TYLENOL) 500 MG TABLET    Take 1,000 mg by mouth every 6 (six) hours as needed for mild pain or moderate pain.   ALBUTEROL (PROVENTIL HFA;VENTOLIN HFA) 108 (90 BASE) MCG/ACT INHALER    Inhale 1-2 puffs into the lungs every 6 (six) hours as needed for wheezing or shortness of breath.   ALENDRONATE (FOSAMAX) 70 MG TABLET    Take 1 tablet by mouth once a week.   ASPIRIN 325 MG EC TABLET    Take 325 mg by mouth daily.   ATENOLOL (TENORMIN) 50 MG TABLET    TAKE 1 TABLET BY MOUTH EVERY DAY TO CONTROL  HEART RHYTHM AND LOWER BLOOD PRESSURE   CALCIUM CITRATE 250 MG TABS    Take 250 tablets by mouth daily.    CHLORPHENIRAMINE-HYDROCODONE (TUSSIONEX PENNKINETIC ER) 10-8 MG/5ML SUER    5 cc every 12 hours if needed for cough   CHOLECALCIFEROL (VITAMIN D) 1000 UNITS TABLET    Take 1,000 Units by mouth daily.     DOXAZOSIN (CARDURA) 4 MG TABLET    One daily to help control BP   FINASTERIDE (PROSCAR) 5 MG TABLET    Take 5 mg by mouth every morning.   MULTIPLE VITAMIN (MULTIVITAMIN) TABLET    Take 1 tablet by mouth daily.     OMEPRAZOLE (PRILOSEC) 20 MG CAPSULE    TAKE ONE CAPSULE BY MOUTH DAILY TO REDUCE STOMACH ACID AND PROTECT ESOPHAGUS   POTASSIUM 99 MG TABS    Take 99 mg by mouth daily.   PROBIOTIC PRODUCT (PROBIOTIC DAILY PO)    Take 2 capsules by mouth daily.    ROSUVASTATIN (CRESTOR) 20 MG TABLET    TAKE ONE TABLET BY MOUTH ONCE DAILY FOR CHOLESTEROL  Modified Medications   No medications on file  Discontinued Medications   No medications on file    Review of Systems  Constitutional: Negative.   HENT: Positive for voice change (laryngospasm (evaluated at [facility]).   Eyes: Negative.   Respiratory: Positive for cough.  Wears CPAP. Followed by Dr. Annamaria Boots.  Cardiovascular: Negative for chest pain, palpitations and leg swelling.  Gastrointestinal: Positive for diarrhea.  Endocrine: Negative.   Musculoskeletal:       History osteoporosis.  Skin: Negative.   Allergic/Immunologic: Negative.   Neurological: Negative.   Hematological: Negative.   Psychiatric/Behavioral: Negative.     Filed Vitals:   11/16/15 1346  BP: 122/68  Pulse: 64  Temp: 97.6 F (36.4 C)  TempSrc: Oral  Height: 5' 7"  (1.702 m)  Weight: 150 lb 6.4 oz (68.221 kg)  SpO2: 97%   Body mass index is 23.55 kg/(m^2). Wt Readings from Last 3 Encounters:  11/16/15 150 lb 6.4 oz (68.221 kg)  09/07/15 146 lb (66.225 kg)  07/13/15 150 lb 3.2 oz (68.13 kg)      Physical Exam  Constitutional: He is  oriented to person, place, and time. He appears well-developed and well-nourished. No distress.  HENT:  Head: Atraumatic.  Right Ear: External ear normal.  Left Ear: External ear normal.  Nose: Nose normal.  Head congestion  Eyes: Conjunctivae and EOM are normal. Pupils are equal, round, and reactive to light.  Neck: Normal range of motion. Neck supple. No JVD present. No tracheal deviation present. No thyromegaly present.  Cardiovascular: Normal rate, regular rhythm, normal heart sounds and intact distal pulses.  Exam reveals no gallop and no friction rub.   No murmur heard. Pulmonary/Chest: Effort normal and breath sounds normal. No respiratory distress. He has no wheezes. He has no rales. He exhibits no tenderness.  Abdominal: Soft. Bowel sounds are normal. He exhibits no distension and no mass. There is no tenderness. There is no rebound.  Musculoskeletal: Normal range of motion. He exhibits no edema or tenderness.  Lymphadenopathy:    He has no cervical adenopathy.  Neurological: He is alert and oriented to person, place, and time. He has normal reflexes. No cranial nerve deficit. Coordination normal.  Skin: Skin is warm and dry. No rash noted. No erythema. No pallor.  Psychiatric: He has a normal mood and affect. His behavior is normal. Judgment and thought content normal.    Labs reviewed: Lab Summary Latest Ref Rng 11/14/2015 07/04/2015 10/27/2014  Hemoglobin 13.0-17.0 g/dL (None) (None) (None)  Hematocrit 39.0-52.0 % (None) (None) (None)  White count - (None) (None) (None)  Platelet count - (None) (None) (None)  Sodium 135 - 146 mmol/L 140 136 138  Potassium 3.5 - 5.3 mmol/L 4.3 4.4 4.4  Calcium 8.6 - 10.3 mg/dL 8.9 8.5(L) 9.6  Phosphorus - (None) (None) (None)  Creatinine 0.70 - 1.18 mg/dL 0.73 0.96 0.88  AST 10 - 35 U/L 18 22 (None)  Alk Phos 40 - 115 U/L 37(L) 56 (None)  Bilirubin 0.2 - 1.2 mg/dL 1.5(H) 1.6(H) (None)  Glucose 65 - 99 mg/dL 100(H) 102(H) 100(H)    Cholesterol - (None) (None) (None)  HDL cholesterol >39 mg/dL (None) 52 50  Triglycerides 0 - 149 mg/dL (None) 55 79  LDL Direct - (None) (None) (None)  LDL Calc 0 - 99 mg/dL (None) 48 57  Total protein 6.1 - 8.1 g/dL 6.3 (None) (None)  Albumin 3.6 - 5.1 g/dL 4.1 4.2 (None)   No results found for: TSH, T3TOTAL, T4TOTAL, THYROIDAB Lab Results  Component Value Date   BUN 14 11/14/2015   BUN 17 07/04/2015   BUN 18 10/27/2014   Lab Results  Component Value Date   HGBA1C 5.6 07/28/2012   HGBA1C  03/04/2009    6.1 (NOTE) The ADA recommends the  following therapeutic goal for glycemic control related to Hgb A1c measurement: Goal of therapy: <6.5 Hgb A1c  Reference: American Diabetes Association: Clinical Practice Recommendations 2010, Diabetes Care, 2010, 33: (Suppl  1).    Assessment/Plan  Laryngismus - improved  Gastroesophageal reflux disease without esophagitis - using omeprazole  Other emphysema ([facility]) - DOE  Cough, persistent - imroved  Essential hypertension - Plan: Comprehensive metabolic panel  Obstructive sleep apnea - continue CPAP  Palpitations - improved when he shifted Tenormin to HS  Dyslipidemia - Plan: Lipid panel

## 2015-12-29 ENCOUNTER — Other Ambulatory Visit: Payer: Self-pay | Admitting: Internal Medicine

## 2016-02-01 ENCOUNTER — Other Ambulatory Visit: Payer: Self-pay | Admitting: Internal Medicine

## 2016-02-01 DIAGNOSIS — I10 Essential (primary) hypertension: Secondary | ICD-10-CM

## 2016-02-10 ENCOUNTER — Other Ambulatory Visit: Payer: Self-pay | Admitting: Internal Medicine

## 2016-03-12 ENCOUNTER — Other Ambulatory Visit: Payer: Self-pay | Admitting: Internal Medicine

## 2016-03-12 DIAGNOSIS — K219 Gastro-esophageal reflux disease without esophagitis: Secondary | ICD-10-CM

## 2016-03-16 ENCOUNTER — Other Ambulatory Visit: Payer: BLUE CROSS/BLUE SHIELD

## 2016-03-16 DIAGNOSIS — E785 Hyperlipidemia, unspecified: Secondary | ICD-10-CM

## 2016-03-16 DIAGNOSIS — I10 Essential (primary) hypertension: Secondary | ICD-10-CM

## 2016-03-16 LAB — LIPID PANEL
CHOL/HDL RATIO: 2 ratio (ref <5.0)
Cholesterol: 119 mg/dL (ref <200)
HDL: 60 mg/dL (ref >40)
LDL CALC: 50 mg/dL (ref <100)
Triglycerides: 47 mg/dL (ref <150)
VLDL: 9 mg/dL (ref <30)

## 2016-03-16 LAB — COMPREHENSIVE METABOLIC PANEL
ALT: 17 U/L (ref 9–46)
AST: 23 U/L (ref 10–35)
Albumin: 3.8 g/dL (ref 3.6–5.1)
Alkaline Phosphatase: 35 U/L — ABNORMAL LOW (ref 40–115)
BILIRUBIN TOTAL: 1.1 mg/dL (ref 0.2–1.2)
BUN: 11 mg/dL (ref 7–25)
CHLORIDE: 105 mmol/L (ref 98–110)
CO2: 26 mmol/L (ref 20–31)
CREATININE: 0.84 mg/dL (ref 0.70–1.18)
Calcium: 9 mg/dL (ref 8.6–10.3)
GLUCOSE: 102 mg/dL — AB (ref 65–99)
Potassium: 4.6 mmol/L (ref 3.5–5.3)
SODIUM: 139 mmol/L (ref 135–146)
Total Protein: 6.4 g/dL (ref 6.1–8.1)

## 2016-03-20 ENCOUNTER — Encounter: Payer: Self-pay | Admitting: Internal Medicine

## 2016-03-20 ENCOUNTER — Ambulatory Visit (INDEPENDENT_AMBULATORY_CARE_PROVIDER_SITE_OTHER): Payer: BLUE CROSS/BLUE SHIELD | Admitting: Internal Medicine

## 2016-03-20 VITALS — BP 118/80 | HR 64 | Temp 97.7°F | Ht 67.0 in | Wt 153.0 lb

## 2016-03-20 DIAGNOSIS — R05 Cough: Secondary | ICD-10-CM

## 2016-03-20 DIAGNOSIS — R053 Chronic cough: Secondary | ICD-10-CM

## 2016-03-20 DIAGNOSIS — E785 Hyperlipidemia, unspecified: Secondary | ICD-10-CM | POA: Diagnosis not present

## 2016-03-20 DIAGNOSIS — I10 Essential (primary) hypertension: Secondary | ICD-10-CM

## 2016-03-20 DIAGNOSIS — G4733 Obstructive sleep apnea (adult) (pediatric): Secondary | ICD-10-CM

## 2016-03-20 DIAGNOSIS — K219 Gastro-esophageal reflux disease without esophagitis: Secondary | ICD-10-CM | POA: Diagnosis not present

## 2016-03-20 MED ORDER — HYDROCOD POLST-CPM POLST ER 10-8 MG/5ML PO SUER: ORAL | 0 refills | Status: DC

## 2016-03-20 NOTE — Progress Notes (Signed)
Facility  [facility]    Place of Service:   OFFICE    Allergies  Allergen Reactions  . Compazine [Prochlorperazine Edisylate] Anxiety    Chief Complaint  Patient presents with  . Medical Management of Chronic Issues    4 month medication management blood pressure, GERD, cholesterol, reviews    HPI:  Essential hypertension - controlled  Gastroesophageal reflux disease without esophagitis - some reflux  Dyslipidemia - controlled  Cough, persistent - still with cough. Nonproductive.  Obstructive sleep apnea - using regularly    Medications: Patient's Medications  New Prescriptions   No medications on file  Previous Medications   ACETAMINOPHEN (TYLENOL) 500 MG TABLET    Take 1,000 mg by mouth every 6 (six) hours as needed for mild pain or moderate pain.   ALBUTEROL (PROVENTIL HFA;VENTOLIN HFA) 108 (90 BASE) MCG/ACT INHALER    Inhale 1-2 puffs into the lungs every 6 (six) hours as needed for wheezing or shortness of breath.   ALENDRONATE (FOSAMAX) 70 MG TABLET    Take 1 tablet by mouth once a week.   ASPIRIN 325 MG EC TABLET    Take 325 mg by mouth daily.   ATENOLOL (TENORMIN) 50 MG TABLET    TAKE 1 TABLET BY MOUTH EVERY DAY TO CONTROL HEART RHYTHM AND LOWER BLOOD PRESSURE   CALCIUM CITRATE 250 MG TABS    Take 250 tablets by mouth daily.    CHLORPHENIRAMINE-HYDROCODONE (TUSSIONEX PENNKINETIC ER) 10-8 MG/5ML SUER    5 cc every 12 hours if needed for cough   CHOLECALCIFEROL (VITAMIN D) 1000 UNITS TABLET    Take 1,000 Units by mouth daily.     DOXAZOSIN (CARDURA) 4 MG TABLET    ONE DAILY TO HELP CONTROL BP   FINASTERIDE (PROSCAR) 5 MG TABLET    Take 5 mg by mouth every morning.   MULTIPLE VITAMIN (MULTIVITAMIN) TABLET    Take 1 tablet by mouth daily.     OMEPRAZOLE (PRILOSEC) 20 MG CAPSULE    TAKE ONE CAPSULE BY MOUTH TO REDUCE STOMACH ACID   POTASSIUM 99 MG TABS    Take 99 mg by mouth daily.   PROBIOTIC PRODUCT (PROBIOTIC DAILY PO)    Take 2 capsules by mouth daily.    ROSUVASTATIN (CRESTOR) 20 MG TABLET    TAKE ONE TABLET BY MOUTH ONCE DAILY FOR CHOLESTEROL  Modified Medications   No medications on file  Discontinued Medications   No medications on file    Review of Systems  Constitutional: Negative.  Negative for activity change, appetite change, fatigue, fever and unexpected weight change.  HENT: Positive for voice change (laryngospasm (evaluated at [facility]). Negative for congestion, ear pain, hearing loss, rhinorrhea, sore throat, tinnitus and trouble swallowing.   Eyes: Negative.        Corrective lenses  Respiratory: Positive for cough. Negative for choking, chest tightness, shortness of breath and wheezing.        Wears CPAP. Followed by Dr. Annamaria Boots.  Cardiovascular: Negative for chest pain, palpitations and leg swelling.  Gastrointestinal: Positive for diarrhea. Negative for abdominal distention, abdominal pain, constipation and nausea.  Endocrine: Negative.  Negative for cold intolerance, heat intolerance, polydipsia, polyphagia and polyuria.  Genitourinary: Negative for dysuria, frequency, testicular pain and urgency.       Not incontinent  Musculoskeletal: Negative for arthralgias, back pain, gait problem, myalgias and neck pain.       History osteoporosis.  Skin: Negative.  Negative for color change, pallor and rash.  Allergic/Immunologic:  Negative.   Neurological: Negative.  Negative for dizziness, tremors, syncope, speech difficulty, weakness, numbness and headaches.  Hematological: Negative.  Negative for adenopathy. Does not bruise/bleed easily.  Psychiatric/Behavioral: Negative.  Negative for behavioral problems, confusion, decreased concentration, hallucinations and sleep disturbance. The patient is not nervous/anxious.     Vitals:   03/20/16 1532  BP: 118/80  Pulse: 64  Temp: 97.7 F (36.5 C)  TempSrc: Oral  SpO2: 97%  Weight: 153 lb (69.4 kg)  Height: 5' 7" (1.702 m)   Body mass index is 23.96 kg/m. Wt Readings from  Last 3 Encounters:  03/20/16 153 lb (69.4 kg)  11/16/15 150 lb 6.4 oz (68.2 kg)  09/07/15 146 lb (66.2 kg)      Physical Exam  Constitutional: He is oriented to person, place, and time. He appears well-developed and well-nourished. No distress.  HENT:  Head: Atraumatic.  Right Ear: External ear normal.  Left Ear: External ear normal.  Nose: Nose normal.  Head congestion  Eyes: Conjunctivae and EOM are normal. Pupils are equal, round, and reactive to light.  Neck: Normal range of motion. Neck supple. No JVD present. No tracheal deviation present. No thyromegaly present.  Cardiovascular: Normal rate, regular rhythm, normal heart sounds and intact distal pulses.  Exam reveals no gallop and no friction rub.   No murmur heard. Pulmonary/Chest: Effort normal and breath sounds normal. No respiratory distress. He has no wheezes. He has no rales. He exhibits no tenderness.  Abdominal: Soft. Bowel sounds are normal. He exhibits no distension and no mass. There is no tenderness. There is no rebound.  Musculoskeletal: Normal range of motion. He exhibits no edema or tenderness.  Lymphadenopathy:    He has no cervical adenopathy.  Neurological: He is alert and oriented to person, place, and time. He has normal reflexes. No cranial nerve deficit. Coordination normal.  Skin: Skin is warm and dry. No rash noted. No erythema. No pallor.  Psychiatric: He has a normal mood and affect. His behavior is normal. Judgment and thought content normal.    Labs reviewed: Lab Summary Latest Ref Rng & Units 03/16/2016 11/14/2015 07/04/2015  Hemoglobin 13.0-17.0 g/dL (None) (None) (None)  Hematocrit 39.0-52.0 % (None) (None) (None)  White count - (None) (None) (None)  Platelet count - (None) (None) (None)  Sodium 135 - 146 mmol/L 139 140 136  Potassium 3.5 - 5.3 mmol/L 4.6 4.3 4.4  Calcium 8.6 - 10.3 mg/dL 9.0 8.9 8.5(L)  Phosphorus - (None) (None) (None)  Creatinine 0.70 - 1.18 mg/dL 0.84 0.73 0.96  AST 10 -  35 U/L _0 Alk Phos 40 - 115 U/L 35(L) 37(L) 56  Bilirubin 0.2 - 1.2 mg/dL 1.1 1.5(H) 1.6(H)  Glucose 65 - 99 mg/dL 102(H) 100(H) 102(H)  Cholesterol <200 mg/dL 119 (None) (None)  HDL cholesterol >40 mg/dL 60 (None) 52  Triglycerides <150 mg/dL 47 (None) 55  LDL Direct - (None) (None) (None)  LDL Calc <100 mg/dL 50 (None) 48  Total protein 6.1 - 8.1 g/dL 6.4 6.3 (None)  Albumin 3.6 - 5.1 g/dL 3.8 4.1 4.2  Some recent data might be hidden   No results found for: TSH, T3TOTAL, T4TOTAL, THYROIDAB Lab Results  Component Value Date   BUN 11 03/16/2016   BUN 14 11/14/2015   BUN 17 07/04/2015   Lab Results  Component Value Date   HGBA1C 5.6 07/28/2012   HGBA1C  03/04/2009    6.1 (NOTE) The ADA recommends the following therapeutic goal for glycemic control  related to Hgb A1c measurement: Goal of therapy: <6.5 Hgb A1c  Reference: American Diabetes Association: Clinical Practice Recommendations 2010, Diabetes Care, 2010, 33: (Suppl  1).    Assessment/Plan  1. Essential hypertension - Comprehensive metabolic panel; Future  2. Gastroesophageal reflux disease without esophagitis Continue Tums  3. Dyslipidemia - Lipid panel; Future  4. Cough, persistent - chlorpheniramine-HYDROcodone (TUSSIONEX PENNKINETIC ER) 10-8 MG/5ML SUER; 5 cc every 12 hours if needed for cough  Dispense: 140 mL; Refill: 0  5. Obstructive sleep apnea Continue CPAP

## 2016-06-13 ENCOUNTER — Encounter: Payer: Self-pay | Admitting: Internal Medicine

## 2016-06-13 NOTE — Progress Notes (Signed)
HPI: FU coronary artery disease. An echocardiogram in 2010 showed an ejection fraction of 50-55% with apical and septal hypokinesis. A Myoview was therefore performed and showed severe anterior ischemia and an ejection fraction of 47%. He therefore underwent cardiac catheterization on March 04, 2009 and this revealed severe three-vessel coronary disease as well as an 80% left main. His ejection fraction was 50%. He ultimately underwent coronary artery bypass and graft on November 1 (left internal mammary artery to left anterior descending, sequential saphenous vein graft to ramus intermedius and obtuse marginal 2, saphenous vein graft to posterior descending. Nuclear study January 2015 showed an ejection fraction of 50%. There is prior anterior, lateral and inferior infarct with mild peri-infarct ischemia. Treated medically. Echocardiogram November 2015 showed normal LV function, grade 1 diastolic dysfunction and moderate left atrial enlargement. Since he was last seen, patient denies dyspnea, chest pain, palpitations or syncope.  Current Outpatient Prescriptions  Medication Sig Dispense Refill  . acetaminophen (TYLENOL) 500 MG tablet Take 1,000 mg by mouth every 6 (six) hours as needed for mild pain or moderate pain.    Marland Kitchen albuterol (PROVENTIL HFA;VENTOLIN HFA) 108 (90 Base) MCG/ACT inhaler Inhale 1-2 puffs into the lungs every 6 (six) hours as needed for wheezing or shortness of breath.    Marland Kitchen alendronate (FOSAMAX) 70 MG tablet Take 1 tablet by mouth once a week.  3  . aspirin 325 MG EC tablet Take 325 mg by mouth daily.    Marland Kitchen atenolol (TENORMIN) 50 MG tablet TAKE 1 TABLET BY MOUTH EVERY DAY TO CONTROL HEART RHYTHM AND LOWER BLOOD PRESSURE 90 tablet 3  . Calcium Citrate 250 MG TABS Take 250 tablets by mouth daily.     . chlorpheniramine-HYDROcodone (TUSSIONEX PENNKINETIC ER) 10-8 MG/5ML SUER 5 cc every 12 hours if needed for cough 140 mL 0  . cholecalciferol (VITAMIN D) 1000 UNITS tablet Take  1,000 Units by mouth daily.      Marland Kitchen doxazosin (CARDURA) 4 MG tablet ONE DAILY TO HELP CONTROL BP 90 tablet 4  . finasteride (PROSCAR) 5 MG tablet Take 5 mg by mouth every morning.    . Multiple Vitamin (MULTIVITAMIN) tablet Take 1 tablet by mouth daily.      Marland Kitchen omeprazole (PRILOSEC) 20 MG capsule TAKE ONE CAPSULE BY MOUTH TO REDUCE STOMACH ACID 90 capsule 1  . Potassium 99 MG TABS Take 99 mg by mouth daily.    . Probiotic Product (PROBIOTIC DAILY PO) Take 2 capsules by mouth daily.     . rosuvastatin (CRESTOR) 20 MG tablet TAKE ONE TABLET BY MOUTH ONCE DAILY FOR CHOLESTEROL 90 tablet 1   No current facility-administered medications for this visit.      Past Medical History:  Diagnosis Date  . Acute gastric ulcer without mention of hemorrhage, perforation, or obstruction   . Allergic rhinitis, cause unspecified   . Anemia, unspecified   . Arthritis   . Asthma   . Blood transfusion without reported diagnosis   . CAD (coronary artery disease)   . Diaphragmatic hernia without mention of obstruction or gangrene   . Elevated prostate specific antigen (PSA)   . Enlarged prostate   . Esophageal reflux   . Extrinsic asthma, unspecified   . Herpes zoster without mention of complication   . Hyperlipidemia   . Hypersomnia with sleep apnea, unspecified   . Impotence of organic origin   . Kyphosis   . Lumbago   . Obstructive sleep apnea (adult) (pediatric)   .  Pneumonia    hx of years ago   . Senile osteoporosis   . Trigger finger (acquired)   . Unspecified essential hypertension   . Unspecified sleep apnea    CPAP- settings 4-12   . Unspecified vitamin D deficiency     Past Surgical History:  Procedure Laterality Date  . CARDIAC CATHETERIZATION  03/04/2009   Dr [name]  . COLONOSCOPY    . CORONARY ARTERY BYPASS GRAFT  2010  . CYSTOSCOPY WITH RETROGRADE PYELOGRAM, URETEROSCOPY AND STENT PLACEMENT Right 10/04/2013   Procedure: CYSTOSCOPY WITH RETROGRADE PYELOGRAM,  AND STENT  PLACEMENT;  Surgeon: Festus Aloe, MD;  Location: [facility];  Service: Urology;  Laterality: Right;  . MOLE REMOVAL  1958  . SHOULDER SURGERY Left 04/1998   Dr French Ana  . TONSILLECTOMY  1945  . TRANSURETHRAL RESECTION OF PROSTATE N/A 09/24/2013   Procedure: TRANSURETHRAL RESECTION OF THE PROSTATE (TURP) WITH GYRUS (STAGED RIGHT LATERAL AND MEDIAN LOBE);  Surgeon: Ailene Rud, MD;  Location: [facility];  Service: Urology;  Laterality: N/A;    Social History   Social History  . Marital status: Married    Spouse name: N/A  . Number of children: N/A  . Years of education: N/A   Occupational History  . [employer]    Social History Main Topics  . Smoking status: Never Smoker  . Smokeless tobacco: Never Used  . Alcohol use No  . Drug use: No  . Sexual activity: Not on file   Other Topics Concern  . Not on file   Social History Narrative  . No narrative on file    Family History  Problem Relation Age of Onset  . Stroke Mother   . Hypertension Father   . Stroke Father   . Heart disease Father   . Heart disease Paternal Grandfather   . Colon cancer Neg Hx     ROS: no fevers or chills, productive cough, hemoptysis, dysphasia, odynophagia, melena, hematochezia, dysuria, hematuria, rash, seizure activity, orthopnea, PND, pedal edema, claudication. Remaining systems are negative.  Physical Exam: Well-developed well-nourished in no acute distress.  Skin is warm and dry.  HEENT is normal.  Neck is supple. No bruits Chest is clear to auscultation with normal expansion.  Cardiovascular exam is regular rate and rhythm.  Abdominal exam nontender or distended. No masses palpated. Extremities show no edema. neuro grossly intact  ECG-Sinus rhythm at a rate of 60. Lateral T-wave inversion.  A/P  1 coronary artery disease-continue aspirin and statin.  2 hyperlipidemia-continue statin.  3 hypertension-blood pressure is mildly elevated. I have asked him to  follow his blood pressure at home. If it runs high we will add additional medications.  Kirk Ruths, MD

## 2016-06-20 ENCOUNTER — Encounter: Payer: Self-pay | Admitting: Cardiology

## 2016-06-20 ENCOUNTER — Ambulatory Visit (INDEPENDENT_AMBULATORY_CARE_PROVIDER_SITE_OTHER): Payer: BLUE CROSS/BLUE SHIELD | Admitting: Cardiology

## 2016-06-20 VITALS — BP 152/71 | HR 60 | Ht 64.0 in | Wt 153.0 lb

## 2016-06-20 DIAGNOSIS — I25810 Atherosclerosis of coronary artery bypass graft(s) without angina pectoris: Secondary | ICD-10-CM

## 2016-06-20 DIAGNOSIS — I2510 Atherosclerotic heart disease of native coronary artery without angina pectoris: Secondary | ICD-10-CM | POA: Diagnosis not present

## 2016-06-20 DIAGNOSIS — E7800 Pure hypercholesterolemia, unspecified: Secondary | ICD-10-CM

## 2016-06-20 DIAGNOSIS — I10 Essential (primary) hypertension: Secondary | ICD-10-CM

## 2016-06-20 NOTE — Patient Instructions (Signed)
Your physician wants you to follow-up in: ONE YEAR WITH DR CRENSHAW You will receive a reminder letter in the mail two months in advance. If you don't receive a letter, please call our office to schedule the follow-up appointment.   If you need a refill on your cardiac medications before your next appointment, please call your pharmacy.  

## 2016-06-22 ENCOUNTER — Encounter (HOSPITAL_BASED_OUTPATIENT_CLINIC_OR_DEPARTMENT_OTHER): Payer: Self-pay | Admitting: *Deleted

## 2016-06-22 ENCOUNTER — Emergency Department (HOSPITAL_BASED_OUTPATIENT_CLINIC_OR_DEPARTMENT_OTHER)
Admission: EM | Admit: 2016-06-22 | Discharge: 2016-06-22 | Disposition: A | Discharge status: Home / Self Care | Payer: BLUE CROSS/BLUE SHIELD | Attending: Emergency Medicine | Admitting: Emergency Medicine

## 2016-06-22 DIAGNOSIS — I10 Essential (primary) hypertension: Secondary | ICD-10-CM | POA: Insufficient documentation

## 2016-06-22 DIAGNOSIS — Z7982 Long term (current) use of aspirin: Secondary | ICD-10-CM | POA: Diagnosis not present

## 2016-06-22 DIAGNOSIS — I25810 Atherosclerosis of coronary artery bypass graft(s) without angina pectoris: Secondary | ICD-10-CM | POA: Diagnosis not present

## 2016-06-22 DIAGNOSIS — J45909 Unspecified asthma, uncomplicated: Secondary | ICD-10-CM | POA: Diagnosis not present

## 2016-06-22 DIAGNOSIS — Z951 Presence of aortocoronary bypass graft: Secondary | ICD-10-CM | POA: Diagnosis not present

## 2016-06-22 DIAGNOSIS — K219 Gastro-esophageal reflux disease without esophagitis: Secondary | ICD-10-CM

## 2016-06-22 DIAGNOSIS — J449 Chronic obstructive pulmonary disease, unspecified: Secondary | ICD-10-CM | POA: Insufficient documentation

## 2016-06-22 NOTE — ED Triage Notes (Addendum)
Pt states his BP is high on his  Old machine , BP in triage 130/70, now c/o URi symptoms x 1 year

## 2016-06-22 NOTE — Discharge Instructions (Signed)
Keep a record of your blood pressures over the next week and take this with you to your next doctor's appointment.  Continue your Prilosec as prescribed. Follow-up with Dr. Fuller Plan if you continue to experience your nausea episodes.  Return to the emergency department if your symptoms significantly worsen or change.

## 2016-06-22 NOTE — ED Provider Notes (Signed)
[place] [facility] Provider Note   CSN: [id number] Arrival date & time: 06/22/16  1312     History   Chief Complaint Chief Complaint  Patient presents with  . URI    HPI Brent Griffith is a 81 y.o. male.  Patient is a 81 year old male with past medical history of reflux, gastritis, coronary artery disease. He presents complaints of elevated blood pressure. He reports checking his blood pressure at home and finding it to be 170. He was using a machine that appears to be many decades old. His blood pressure here today is much better. He denies any chest pain or difficulty breathing. He does also report intermittent GI upset and nausea that has been occurring for nearly one year. He is followed by his gastroenterologist, Dr. Fuller Plan for this. He denies any significant abdominal pain, bloody stools, fevers, or other complaint.    URI      Past Medical History:  Diagnosis Date  . Acute gastric ulcer without mention of hemorrhage, perforation, or obstruction   . Allergic rhinitis, cause unspecified   . Anemia, unspecified   . Arthritis   . Asthma   . Blood transfusion without reported diagnosis   . CAD (coronary artery disease)   . Diaphragmatic hernia without mention of obstruction or gangrene   . Elevated prostate specific antigen (PSA)   . Enlarged prostate   . Esophageal reflux   . Extrinsic asthma, unspecified   . Herpes zoster without mention of complication   . Hyperlipidemia   . Hypersomnia with sleep apnea, unspecified   . Impotence of organic origin   . Kyphosis   . Lumbago   . Obstructive sleep apnea (adult) (pediatric)   . Pneumonia    hx of years ago   . Senile osteoporosis   . Trigger finger (acquired)   . Unspecified essential hypertension   . Unspecified sleep apnea    CPAP- settings 4-12   . Unspecified vitamin D deficiency     Patient Active Problem List   Diagnosis Date Noted  . 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 Palpitations 09/07/2015   . Cough, persistent 07/20/2015  . Laryngismus 07/20/2015  . Senile osteoporosis 06/09/2014  . Shortness of breath 03/20/2014  . UTI (urinary tract infection) 10/04/2013  . Hydronephrosis of right kidney 10/03/2013  . Benign hypertrophy of prostate 09/24/2013  . 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 Dyslipidemia 04/06/2009  . CARDIOVASCULAR FUNCTION STUDY, ABNORMAL 03/03/2009  . HYPERTENSION, PULMONARY 02/09/2009  . Obstructive sleep apnea 02/04/2009  . Essential hypertension 09/02/2008  . G E R D 09/02/2008  . 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 CARDIAC CATHETERIZATION  03/04/2009   Dr [name]  . COLONOSCOPY    . CORONARY ARTERY BYPASS GRAFT  2010  . CYSTOSCOPY WITH RETROGRADE PYELOGRAM, URETEROSCOPY AND STENT PLACEMENT Right 10/04/2013   Procedure: CYSTOSCOPY WITH RETROGRADE PYELOGRAM,  AND STENT PLACEMENT;  Surgeon: Festus Aloe, MD;  Location: [facility];  Service: Urology;  Laterality: Right;  . MOLE REMOVAL  1958  . SHOULDER SURGERY Left 04/1998   Dr French Ana  . TONSILLECTOMY  1945  . TRANSURETHRAL RESECTION OF PROSTATE N/A 09/24/2013   Procedure: TRANSURETHRAL RESECTION OF THE PROSTATE (TURP) WITH GYRUS (STAGED RIGHT LATERAL AND MEDIAN LOBE);  Surgeon: Ailene Rud, MD;  Location: [facility];  Service: Urology;  Laterality: N/A;       Home Medications    Prior to Admission medications  Medication Sig Start Date End Date Taking? Authorizing Provider  acetaminophen (TYLENOL) 500 MG tablet Take 1,000 mg by mouth every 6 (six) hours as needed for mild pain or moderate pain.    Historical Provider, MD  albuterol (PROVENTIL HFA;VENTOLIN HFA) 108 (90 Base) MCG/ACT inhaler Inhale 1-2 puffs into the lungs every 6 (six) hours as needed for wheezing or shortness of breath.    Historical Provider, MD  alendronate (FOSAMAX) 70 MG tablet Take 1 tablet by mouth once a week. 06/23/15   Historical Provider, MD  aspirin 325 MG EC  tablet Take 325 mg by mouth daily.    Historical Provider, MD  atenolol (TENORMIN) 50 MG tablet TAKE 1 TABLET BY MOUTH EVERY DAY TO CONTROL HEART RHYTHM AND LOWER BLOOD PRESSURE 12/29/15   Estill Dooms, MD  Calcium Citrate 250 MG TABS Take 250 tablets by mouth daily.     Historical Provider, MD  chlorpheniramine-HYDROcodone [facility]) 10-8 MG/5ML SUER 5 cc every 12 hours if needed for cough 03/20/16   Estill Dooms, MD  cholecalciferol (VITAMIN D) 1000 UNITS tablet Take 1,000 Units by mouth daily.      Historical Provider, MD  doxazosin (CARDURA) 4 MG tablet ONE DAILY TO HELP CONTROL BP 02/01/16   Estill Dooms, MD  finasteride (PROSCAR) 5 MG tablet Take 5 mg by mouth every morning.    Historical Provider, MD  Multiple Vitamin (MULTIVITAMIN) tablet Take 1 tablet by mouth daily.      Historical Provider, MD  omeprazole (PRILOSEC) 20 MG capsule TAKE ONE CAPSULE BY MOUTH TO REDUCE STOMACH ACID 03/12/16   Estill Dooms, MD  Potassium 99 MG TABS Take 99 mg by mouth daily.    Historical Provider, MD  Probiotic Product (PROBIOTIC DAILY PO) Take 2 capsules by mouth daily.     Historical Provider, MD  rosuvastatin (CRESTOR) 20 MG tablet TAKE ONE TABLET BY MOUTH ONCE DAILY FOR CHOLESTEROL 02/10/16   Estill Dooms, MD    Family History Family History  Problem Relation Age of Onset  . Stroke Mother   . Hypertension Father   . Stroke Father   . Heart disease Father   . Heart disease Paternal Grandfather   . Colon cancer Neg Hx     Social History Social History  Substance Use Topics  . Smoking status: Never Smoker  . Smokeless tobacco: Never Used  . Alcohol use No     Allergies   Compazine [prochlorperazine edisylate]   Review of Systems Review of Systems  All other systems reviewed and are negative.    Physical Exam Updated Vital Signs BP 132/70 (BP Location: Left Arm)   Pulse 66   Temp 98.6 F (37 C) (Oral)   Resp 16   Ht 5\' 4"  (1.626 m)   Wt 153 lb (69.4 kg)    SpO2 100%   BMI 26.26 kg/m   Physical Exam  Constitutional: He is oriented to person, place, and time. He appears well-developed and well-nourished. No distress.  HENT:  Head: Normocephalic and atraumatic.  Mouth/Throat: Oropharynx is clear and moist.  Neck: Normal range of motion. Neck supple.  Cardiovascular: Normal rate and regular rhythm.  Exam reveals no friction rub.   No murmur heard. Pulmonary/Chest: Effort normal and breath sounds normal. No respiratory distress. He has no wheezes. He has no rales.  Abdominal: Soft. Bowel sounds are normal. He exhibits no distension. There is no tenderness.  Musculoskeletal: Normal range of motion. He exhibits no edema.  Neurological: He is alert and oriented to person, place, and time. Coordination normal.  Skin: Skin is warm and dry. He is not diaphoretic.  Nursing note and vitals reviewed.    ED Treatments / Results  Labs (all labs ordered are listed, but only abnormal results are displayed) Labs Reviewed - No data to display  EKG  EKG Interpretation None       Radiology No results found.  Procedures Procedures (including critical care time)  Medications Ordered in ED Medications - No data to display   Initial Impression / Assessment and Plan / ED Course  I have reviewed the triage vital signs and the nursing notes.  Pertinent labs & imaging results that were available during my care of the patient were reviewed by me and considered in my medical decision making (see chart for details).  Final Clinical Impressions(s) / ED Diagnoses   Final diagnoses:  None    New Prescriptions New Prescriptions   No medications on file   Patient presents here with multiple complaints that seem seemingly unrelated. His initial complaint was elevated blood pressure. He checked his blood pressure at home and the machine informed him that it was high. This machine is several decades old and I suspect is unreliable as his blood  pressure here is 130/70. He reports intermittent GI upset the past year. He does have a gastroenterologist and I have advised him to follow-up for this.  Nothing today appears acute. He will be discharged and advised to keep a record of his blood pressures. He is to also follow-up with his GI doctor if he experiences continued GI upset.   Veryl Speak, MD 06/22/16 [phone]

## 2016-06-26 ENCOUNTER — Encounter: Payer: Self-pay | Admitting: Gastroenterology

## 2016-07-06 ENCOUNTER — Ambulatory Visit: Payer: BLUE CROSS/BLUE SHIELD | Admitting: Internal Medicine

## 2016-07-17 ENCOUNTER — Encounter: Payer: Self-pay | Admitting: Cardiology

## 2016-07-17 ENCOUNTER — Encounter (HOSPITAL_BASED_OUTPATIENT_CLINIC_OR_DEPARTMENT_OTHER): Payer: Self-pay | Admitting: *Deleted

## 2016-07-17 ENCOUNTER — Emergency Department (HOSPITAL_BASED_OUTPATIENT_CLINIC_OR_DEPARTMENT_OTHER)
Admission: EM | Admit: 2016-07-17 | Discharge: 2016-07-17 | Disposition: A | Discharge status: Home / Self Care | Payer: BLUE CROSS/BLUE SHIELD | Attending: Emergency Medicine | Admitting: Emergency Medicine

## 2016-07-17 ENCOUNTER — Telehealth: Payer: Self-pay | Admitting: *Deleted

## 2016-07-17 DIAGNOSIS — I2510 Atherosclerotic heart disease of native coronary artery without angina pectoris: Secondary | ICD-10-CM | POA: Diagnosis not present

## 2016-07-17 DIAGNOSIS — M549 Dorsalgia, unspecified: Secondary | ICD-10-CM | POA: Insufficient documentation

## 2016-07-17 DIAGNOSIS — Z79899 Other long term (current) drug therapy: Secondary | ICD-10-CM | POA: Diagnosis not present

## 2016-07-17 DIAGNOSIS — I10 Essential (primary) hypertension: Secondary | ICD-10-CM | POA: Insufficient documentation

## 2016-07-17 DIAGNOSIS — J45909 Unspecified asthma, uncomplicated: Secondary | ICD-10-CM | POA: Insufficient documentation

## 2016-07-17 DIAGNOSIS — R110 Nausea: Secondary | ICD-10-CM | POA: Diagnosis present

## 2016-07-17 DIAGNOSIS — Z7982 Long term (current) use of aspirin: Secondary | ICD-10-CM | POA: Insufficient documentation

## 2016-07-17 LAB — COMPREHENSIVE METABOLIC PANEL
ALBUMIN: 3.8 g/dL (ref 3.5–5.0)
ALT: 14 U/L — ABNORMAL LOW (ref 17–63)
AST: 21 U/L (ref 15–41)
Alkaline Phosphatase: 38 U/L (ref 38–126)
Anion gap: 7 (ref 5–15)
BILIRUBIN TOTAL: 1.9 mg/dL — AB (ref 0.3–1.2)
BUN: 14 mg/dL (ref 6–20)
CHLORIDE: 105 mmol/L (ref 101–111)
CO2: 23 mmol/L (ref 22–32)
Calcium: 8.7 mg/dL — ABNORMAL LOW (ref 8.9–10.3)
Creatinine, Ser: 0.77 mg/dL (ref 0.61–1.24)
GFR calc Af Amer: >60 mL/min (ref >60)
GFR calc non Af Amer: >60 mL/min (ref >60)
GLUCOSE: 104 mg/dL — AB (ref 65–99)
POTASSIUM: 4 mmol/L (ref 3.5–5.1)
Sodium: 135 mmol/L (ref 135–145)
Total Protein: 6.5 g/dL (ref 6.5–8.1)

## 2016-07-17 LAB — TROPONIN I: Troponin I: <0.03 ng/mL (ref <0.03)

## 2016-07-17 LAB — CBC WITH DIFFERENTIAL/PLATELET
Basophils Absolute: 0 10*3/uL (ref 0.0–0.1)
Basophils Relative: 0 %
Eosinophils Absolute: 0 10*3/uL (ref 0.0–0.7)
Eosinophils Relative: 1 %
HCT: 34.7 % — ABNORMAL LOW (ref 39.0–52.0)
Hemoglobin: 11.9 g/dL — ABNORMAL LOW (ref 13.0–17.0)
LYMPHS ABS: 1 10*3/uL (ref 0.7–4.0)
LYMPHS PCT: 20 %
MCH: 29.6 pg (ref 26.0–34.0)
MCHC: 34.3 g/dL (ref 30.0–36.0)
MCV: 86.3 fL (ref 78.0–100.0)
MONO ABS: 0.4 10*3/uL (ref 0.1–1.0)
MONOS PCT: 8 %
Neutro Abs: 3.4 10*3/uL (ref 1.7–7.7)
Neutrophils Relative %: 71 %
Platelets: 152 10*3/uL (ref 150–400)
RBC: 4.02 MIL/uL — ABNORMAL LOW (ref 4.22–5.81)
RDW: 12.1 % (ref 11.5–15.5)
WBC: 4.9 10*3/uL (ref 4.0–10.5)

## 2016-07-17 LAB — LIPASE, BLOOD: LIPASE: 27 U/L (ref 11–51)

## 2016-07-17 MED ORDER — METOCLOPRAMIDE HCL 10 MG PO TABS: 5.0000 mg | ORAL_TABLET | Freq: Four times a day (QID) | ORAL | 0 refills | Status: DC | PRN

## 2016-07-17 MED ORDER — METOCLOPRAMIDE HCL 10 MG PO TABS
5.0000 mg | ORAL_TABLET | Freq: Once | ORAL | Status: AC
  Administered 2016-07-17: 5 mg via ORAL
  Filled 2016-07-17: qty 1

## 2016-07-17 MED FILL — METOCLOPRAMIDE 10 MG TABLET: 10 | 3 days supply | Qty: 6 | Fill #0

## 2016-07-17 NOTE — Telephone Encounter (Signed)
-----   Message from Lelon Perla, MD sent at 07/17/2016  2:01 PM EDT ----- Therisa Doyne Kirk Ruths  ----- Message ----- From: Orlie Dakin, MD Sent: 07/17/2016   1:48 PM To: Lelon Perla, MD  Salley Scarlet Aaron Edelman: Brent Griffith presented to the emergency department [place] that are [place] with complaint of nausea and the bag back pain which she states occurred last night. He states that back pain remotely resembled angina he had from many years ago. He gets nausea at least weekly for reasons unknown. He was pain-free when I saw him. Nausea resolved after treatment with Reglan. He also reported to me that he was concerned about his blood pressure which she had taken at home and noted to be 157/99. 2 separate blood pressures were normal while here. I suggested that he contact you for an evaluation before he proceeds with dental extractions scheduled for  week after next. He is a very nice gentleman, however his symptoms are somewhat vague. Thanks for your help. Stay well, [name]

## 2016-07-17 NOTE — Telephone Encounter (Signed)
This encounter was created in error - please disregard.

## 2016-07-17 NOTE — ED Notes (Signed)
ED Provider at bedside. 

## 2016-07-17 NOTE — ED Provider Notes (Signed)
[facility] Provider Note   CSN: [id number] Arrival date & time: 07/17/16  1132     History   Chief Complaint Chief Complaint  Patient presents with  . Hypertension    HPI Brent Griffith is a 81 y.o. male.  HPI patient reports he had elevated blood pressure last night. He measured it at 157/99. He also had a popping in his right ear last night worse when he laid down. He has no popping in his right ear presently. This morning he feels somewhat nauseated upon awakening and he complains of nausea proximal may once per week. He also reports that pain last night which somewhat resembled angina he had approximately 10 years ago. No back pain presently. Presently only complaint is mild nausea. No other associated symptoms. Nothing makes symptoms better or worse. No abdominal pain no chest pain.  Past Medical History:  Diagnosis Date  . Acute gastric ulcer without mention of hemorrhage, perforation, or obstruction   . Allergic rhinitis, cause unspecified   . Anemia, unspecified   . Arthritis   . Asthma   . Blood transfusion without reported diagnosis   . CAD (coronary artery disease)   . Diaphragmatic hernia without mention of obstruction or gangrene   . Elevated prostate specific antigen (PSA)   . Enlarged prostate   . Esophageal reflux   . Extrinsic asthma, unspecified   . Herpes zoster without mention of complication   . Hyperlipidemia   . Hypersomnia with sleep apnea, unspecified   . Impotence of organic origin   . Kyphosis   . Lumbago   . Obstructive sleep apnea (adult) (pediatric)   . Pneumonia    hx of years ago   . Senile osteoporosis   . Trigger finger (acquired)   . Unspecified essential hypertension   . Unspecified sleep apnea    CPAP- settings 4-12   . Unspecified vitamin D deficiency     Patient Active Problem List   Diagnosis Date Noted  . 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 Palpitations 09/07/2015  . Cough, persistent 07/20/2015  .  Laryngismus 07/20/2015  . Senile osteoporosis 06/09/2014  . Shortness of breath 03/20/2014  . UTI (urinary tract infection) 10/04/2013  . Hydronephrosis of right kidney 10/03/2013  . Benign hypertrophy of prostate 09/24/2013  . 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 Dyslipidemia 04/06/2009  . CARDIOVASCULAR FUNCTION STUDY, ABNORMAL 03/03/2009  . HYPERTENSION, PULMONARY 02/09/2009  . Obstructive sleep apnea 02/04/2009  . Essential hypertension 09/02/2008  . G E R D 09/02/2008  . 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 CARDIAC CATHETERIZATION  03/04/2009   Dr [name]  . COLONOSCOPY    . CORONARY ARTERY BYPASS GRAFT  2010  . CYSTOSCOPY WITH RETROGRADE PYELOGRAM, URETEROSCOPY AND STENT PLACEMENT Right 10/04/2013   Procedure: CYSTOSCOPY WITH RETROGRADE PYELOGRAM,  AND STENT PLACEMENT;  Surgeon: Festus Aloe, MD;  Location: [facility];  Service: Urology;  Laterality: Right;  . MOLE REMOVAL  1958  . SHOULDER SURGERY Left 04/1998   Dr French Ana  . TONSILLECTOMY  1945  . TRANSURETHRAL RESECTION OF PROSTATE N/A 09/24/2013   Procedure: TRANSURETHRAL RESECTION OF THE PROSTATE (TURP) WITH GYRUS (STAGED RIGHT LATERAL AND MEDIAN LOBE);  Surgeon: Ailene Rud, MD;  Location: [facility];  Service: Urology;  Laterality: N/A;       Home Medications    Prior to Admission medications   Medication Sig Start Date End Date Taking? Authorizing Provider  acetaminophen (TYLENOL) 500 MG tablet Take 1,000 mg by mouth every 6 (six) hours as needed for mild pain or moderate pain.    Historical Provider, MD  albuterol (PROVENTIL HFA;VENTOLIN HFA) 108 (90 Base) MCG/ACT inhaler Inhale 1-2 puffs into the lungs every 6 (six) hours as needed for wheezing or shortness of breath.    Historical Provider, MD  alendronate (FOSAMAX) 70 MG tablet Take 1 tablet by mouth once a week. 06/23/15   Historical Provider, MD  aspirin 325 MG EC tablet Take 325 mg by mouth daily.     Historical Provider, MD  atenolol (TENORMIN) 50 MG tablet TAKE 1 TABLET BY MOUTH EVERY DAY TO CONTROL HEART RHYTHM AND LOWER BLOOD PRESSURE 12/29/15   Estill Dooms, MD  Calcium Citrate 250 MG TABS Take 250 tablets by mouth daily.     Historical Provider, MD  chlorpheniramine-HYDROcodone [facility]) 10-8 MG/5ML SUER 5 cc every 12 hours if needed for cough 03/20/16   Estill Dooms, MD  cholecalciferol (VITAMIN D) 1000 UNITS tablet Take 1,000 Units by mouth daily.      Historical Provider, MD  doxazosin (CARDURA) 4 MG tablet ONE DAILY TO HELP CONTROL BP 02/01/16   Estill Dooms, MD  finasteride (PROSCAR) 5 MG tablet Take 5 mg by mouth every morning.    Historical Provider, MD  Multiple Vitamin (MULTIVITAMIN) tablet Take 1 tablet by mouth daily.      Historical Provider, MD  omeprazole (PRILOSEC) 20 MG capsule TAKE ONE CAPSULE BY MOUTH TO REDUCE STOMACH ACID 03/12/16   Estill Dooms, MD  Potassium 99 MG TABS Take 99 mg by mouth daily.    Historical Provider, MD  Probiotic Product (PROBIOTIC DAILY PO) Take 2 capsules by mouth daily.     Historical Provider, MD  rosuvastatin (CRESTOR) 20 MG tablet TAKE ONE TABLET BY MOUTH ONCE DAILY FOR CHOLESTEROL 02/10/16   Estill Dooms, MD    Family History Family History  Problem Relation Age of Onset  . Stroke Mother   . Hypertension Father   . Stroke Father   . Heart disease Father   . Heart disease Paternal Grandfather   . Colon cancer Neg Hx     Social History Social History  Substance Use Topics  . Smoking status: Never Smoker  . Smokeless tobacco: Never Used  . Alcohol use No     Allergies   Compazine [prochlorperazine edisylate]   Review of Systems Review of Systems  Constitutional: Negative.   HENT: Negative.        Popping in right ear  Respiratory: Negative.   Cardiovascular: Negative.   Gastrointestinal: Positive for nausea. Negative for vomiting.  Musculoskeletal: Positive for back pain.  Skin: Negative.     Neurological: Negative.   Psychiatric/Behavioral: Negative.      Physical Exam Updated Vital Signs BP 123/67   Pulse 64   Temp 97.4 F (36.3 C) (Oral)   Resp 18   Ht 5\' 4"  (1.626 m)   Wt 153 lb (69.4 kg)   SpO2 99%   BMI 26.26 kg/m   Physical Exam  Constitutional: He appears well-developed and well-nourished.  HENT:  Head: Normocephalic and atraumatic.  Bilateral tympanic membranes normal  Eyes: Conjunctivae are normal. Pupils are equal, round, and reactive to light.  Neck: Neck supple. No tracheal deviation present. No thyromegaly present.  No bruit  Cardiovascular: Normal rate and regular rhythm.   No murmur heard. Pulmonary/Chest: Effort normal and breath sounds normal.  Abdominal: Soft.  Bowel sounds are normal. He exhibits no distension. There is no tenderness.  Musculoskeletal: Normal range of motion. He exhibits no edema or tenderness.  Back is nontender  Neurological: He is alert. Coordination normal.  Skin: Skin is warm and dry. No rash noted.  Psychiatric: He has a normal mood and affect.  Nursing note and vitals reviewed.    ED Treatments / Results  Labs (all labs ordered are listed, but only abnormal results are displayed) Labs Reviewed - No data to display  EKG  EKG Interpretation  Date/Time:  Tuesday July 17 2016 12:15:30 EDT Ventricular Rate:  55 PR Interval:    QRS Duration: 116 QT Interval:  447 QTC Calculation: 428 R Axis:   -11 Text Interpretation:  Sinus rhythm Incomplete left bundle branch block Anterior Q waves, possibly due to ILBBB Since last tracing rate slower Confirmed by Winfred Leeds  MD, Jannely Henthorn [phone]) on 07/17/2016 12:30:20 PM       Radiology No results found.  Procedures Procedures (including critical care time)  Medications Ordered in ED Medications  metoCLOPramide (REGLAN) tablet 5 mg (not administered)     Initial Impression / Assessment and Plan / ED Course  I have reviewed the triage vital signs and the nursing  notes.  Pertinent labs & imaging results that were available during my care of the patient were reviewed by me and considered in my medical decision making (see chart for details).     1:40 PM nausea is improved after treatment with oral Reglan. He is able to drink without difficulty. Results for orders placed or performed during the hospital encounter of 07/17/16  Comprehensive metabolic panel  Result Value Ref Range   Sodium 135 135 - 145 mmol/L   Potassium 4.0 3.5 - 5.1 mmol/L   Chloride 105 101 - 111 mmol/L   CO2 23 22 - 32 mmol/L   Glucose, Bld 104 (H) 65 - 99 mg/dL   BUN 14 6 - 20 mg/dL   Creatinine, Ser 0.77 0.61 - 1.24 mg/dL   Calcium 8.7 (L) 8.9 - 10.3 mg/dL   Total Protein 6.5 6.5 - 8.1 g/dL   Albumin 3.8 3.5 - 5.0 g/dL   AST 21 15 - 41 U/L   ALT 14 (L) 17 - 63 U/L   Alkaline Phosphatase 38 38 - 126 U/L   Total Bilirubin 1.9 (H) 0.3 - 1.2 mg/dL   GFR calc non Af Amer >60 >60 mL/min   GFR calc Af Amer >60 >60 mL/min   Anion gap 7 5 - 15  CBC with Differential/Platelet  Result Value Ref Range   WBC 4.9 4.0 - 10.5 K/uL   RBC 4.02 (L) 4.22 - 5.81 MIL/uL   Hemoglobin 11.9 (L) 13.0 - 17.0 g/dL   HCT 34.7 (L) 39.0 - 52.0 %   MCV 86.3 78.0 - 100.0 fL   MCH 29.6 26.0 - 34.0 pg   MCHC 34.3 30.0 - 36.0 g/dL   RDW 12.1 11.5 - 15.5 %   Platelets 152 150 - 400 K/uL   Neutrophils Relative % 71 %   Neutro Abs 3.4 1.7 - 7.7 K/uL   Lymphocytes Relative 20 %   Lymphs Abs 1.0 0.7 - 4.0 K/uL   Monocytes Relative 8 %   Monocytes Absolute 0.4 0.1 - 1.0 K/uL   Eosinophils Relative 1 %   Eosinophils Absolute 0.0 0.0 - 0.7 K/uL   Basophils Relative 0 %   Basophils Absolute 0.0 0.0 - 0.1 K/uL  Troponin I  Result  Value Ref Range   Troponin I <0.03 <0.03 ng/mL  Lipase, blood  Result Value Ref Range   Lipase 27 11 - 51 U/L   No results found. In light of back pain that remotely felt like angina of many years ago, I feel that he should follow up with his per cardiologist Dr.  Stanford Breed, although symptoms are not clear cut for angina as he has dental extractions planned in the next few weeks. Blood pressure has been normal while here Final Clinical Impressions(s) / ED Diagnoses  Diagnosis #1 back pain #2 nausea Final diagnoses:  None    New Prescriptions New Prescriptions   No medications on file     Orlie Dakin, MD 07/17/16 1346

## 2016-07-17 NOTE — Discharge Instructions (Signed)
Take the medication prescribed as needed for nausea. Call Dr. Stanford Breed to arrange to be seen in his office before you proceed with dental extractions. Return if concern for any reason

## 2016-07-17 NOTE — Telephone Encounter (Signed)
Left message for pt to call.

## 2016-07-17 NOTE — ED Triage Notes (Addendum)
Hx of HTN. States he feels a pop in his right ear. He has a separate complaint of burning in his abdomen with hx of stomach ulcer per pt.

## 2016-07-18 ENCOUNTER — Other Ambulatory Visit: Payer: Self-pay | Admitting: Sports Medicine

## 2016-07-18 DIAGNOSIS — M810 Age-related osteoporosis without current pathological fracture: Secondary | ICD-10-CM

## 2016-07-19 NOTE — Telephone Encounter (Signed)
Follow up scheduled in the [place] office

## 2016-07-31 ENCOUNTER — Other Ambulatory Visit: Payer: Self-pay | Admitting: Internal Medicine

## 2016-08-02 ENCOUNTER — Encounter: Payer: Self-pay | Admitting: Cardiology

## 2016-08-06 ENCOUNTER — Other Ambulatory Visit: Payer: Self-pay

## 2016-08-06 ENCOUNTER — Ambulatory Visit (INDEPENDENT_AMBULATORY_CARE_PROVIDER_SITE_OTHER): Payer: BLUE CROSS/BLUE SHIELD | Admitting: Gastroenterology

## 2016-08-06 ENCOUNTER — Other Ambulatory Visit (INDEPENDENT_AMBULATORY_CARE_PROVIDER_SITE_OTHER): Payer: BLUE CROSS/BLUE SHIELD

## 2016-08-06 ENCOUNTER — Encounter: Payer: Self-pay | Admitting: Gastroenterology

## 2016-08-06 VITALS — BP 100/54 | HR 64 | Ht 63.5 in | Wt 149.0 lb

## 2016-08-06 DIAGNOSIS — D649 Anemia, unspecified: Secondary | ICD-10-CM

## 2016-08-06 DIAGNOSIS — K219 Gastro-esophageal reflux disease without esophagitis: Secondary | ICD-10-CM

## 2016-08-06 DIAGNOSIS — R1013 Epigastric pain: Secondary | ICD-10-CM | POA: Diagnosis not present

## 2016-08-06 LAB — VITAMIN B12: Vitamin B-12: 329 pg/mL (ref 211–911)

## 2016-08-06 LAB — IBC PANEL
IRON: 166 ug/dL — AB (ref 42–165)
Saturation Ratios: 34.1 % (ref 20.0–50.0)
Transferrin: 348 mg/dL (ref 212.0–360.0)

## 2016-08-06 LAB — FERRITIN: Ferritin: 16.3 ng/mL — ABNORMAL LOW (ref 22.0–322.0)

## 2016-08-06 LAB — FOLATE: FOLATE: 12.9 ng/mL (ref >5.9)

## 2016-08-06 MED ORDER — RANITIDINE HCL 150 MG PO TABS: 150.0000 mg | ORAL_TABLET | Freq: Every day | ORAL | 11 refills | Status: DC

## 2016-08-06 NOTE — Progress Notes (Signed)
History of Present Illness: This is a 81 year old male self referred for the evaluation of GERD, abdominal discomfort. He is accompanied by his wife. He has frequent nighttime regurgitation and occasional breakthrough symptoms. He states he took omeprazole twice daily for a while and this symptoms were not improved so he resumed once a day dosing. He notes mild epigastric discomfort and bloating. The symptoms have been present for about one year. He relates difficulty chewing food due to poor dentition and notes about a 10 pound weight loss. Denies constipation, diarrhea, change in stool caliber, melena, hematochezia, nausea, vomiting, dysphagia, chest pain. Hb=11.9, t bili 1.9  Colonoscopy 05/2013 showed 1 small tubular adenoma, diverticulosis, internal hemorrhoids.    Allergies  Allergen Reactions  . Compazine [Prochlorperazine Edisylate] Anxiety   Outpatient Medications Prior to Visit  Medication Sig Dispense Refill  . acetaminophen (TYLENOL) 500 MG tablet Take 1,000 mg by mouth every 6 (six) hours as needed for mild pain or moderate pain.    Marland Kitchen albuterol (PROVENTIL HFA;VENTOLIN HFA) 108 (90 Base) MCG/ACT inhaler Inhale 1-2 puffs into the lungs every 6 (six) hours as needed for wheezing or shortness of breath.    Marland Kitchen alendronate (FOSAMAX) 70 MG tablet Take 1 tablet by mouth once a week.  3  . aspirin 325 MG EC tablet Take 325 mg by mouth daily.    Marland Kitchen atenolol (TENORMIN) 50 MG tablet TAKE 1 TABLET BY MOUTH EVERY DAY TO CONTROL HEART RHYTHM AND LOWER BLOOD PRESSURE 90 tablet 3  . Calcium Citrate 250 MG TABS Take 250 tablets by mouth daily.     . chlorpheniramine-HYDROcodone (TUSSIONEX PENNKINETIC ER) 10-8 MG/5ML SUER 5 cc every 12 hours if needed for cough 140 mL 0  . cholecalciferol (VITAMIN D) 1000 UNITS tablet Take 1,000 Units by mouth daily.      Marland Kitchen doxazosin (CARDURA) 4 MG tablet ONE DAILY TO HELP CONTROL BP 90 tablet 4  . finasteride (PROSCAR) 5 MG tablet Take 5 mg by mouth every morning.     . metoCLOPramide (REGLAN) 10 MG tablet Take 0.5 tablets (5 mg total) by mouth every 6 (six) hours as needed for nausea (nausea/headache). 6 tablet 0  . Multiple Vitamin (MULTIVITAMIN) tablet Take 1 tablet by mouth daily.      Marland Kitchen omeprazole (PRILOSEC) 20 MG capsule TAKE ONE CAPSULE BY MOUTH TO REDUCE STOMACH ACID 90 capsule 1  . Potassium 99 MG TABS Take 99 mg by mouth daily.    . Probiotic Product (PROBIOTIC DAILY PO) Take 2 capsules by mouth daily.     . rosuvastatin (CRESTOR) 20 MG tablet TAKE ONE TABLET BY MOUTH ONCE DAILY FOR CHOLESTEROL 90 tablet 1   No facility-administered medications prior to visit.    Past Medical History:  Diagnosis Date  . Acute gastric ulcer without mention of hemorrhage, perforation, or obstruction   . Allergic rhinitis, cause unspecified   . Anemia, unspecified   . Arthritis   . Asthma   . Blood transfusion without reported diagnosis   . CAD (coronary artery disease)   . Diaphragmatic hernia without mention of obstruction or gangrene   . Elevated prostate specific antigen (PSA)   . Enlarged prostate   . Esophageal reflux   . Extrinsic asthma, unspecified   . Herpes zoster without mention of complication   . Hyperlipidemia   . Hypersomnia with sleep apnea, unspecified   . Impotence of organic origin   . Kyphosis   . Lumbago   . Obstructive sleep  apnea (adult) (pediatric)   . Pneumonia    hx of years ago   . Senile osteoporosis   . Trigger finger (acquired)   . Tubular adenoma of colon 05/2013  . Unspecified essential hypertension   . Unspecified sleep apnea    CPAP- settings 4-12   . Unspecified vitamin D deficiency    Past Surgical History:  Procedure Laterality Date  . CARDIAC CATHETERIZATION  03/04/2009   Dr [name]  . COLONOSCOPY    . CORONARY ARTERY BYPASS GRAFT  2010  . CYSTOSCOPY WITH RETROGRADE PYELOGRAM, URETEROSCOPY AND STENT PLACEMENT Right 10/04/2013   Procedure: CYSTOSCOPY WITH RETROGRADE PYELOGRAM,  AND STENT PLACEMENT;   Surgeon: Festus Aloe, MD;  Location: [facility];  Service: Urology;  Laterality: Right;  . MOLE REMOVAL  1958   chin  . ROTATOR CUFF REPAIR Left 04/1998   with bone spur removed, Dr French Ana  . TONSILLECTOMY  1945  . TRANSURETHRAL RESECTION OF PROSTATE N/A 09/24/2013   Procedure: TRANSURETHRAL RESECTION OF THE PROSTATE (TURP) WITH GYRUS (STAGED RIGHT LATERAL AND MEDIAN LOBE);  Surgeon: Ailene Rud, MD;  Location: [facility];  Service: Urology;  Laterality: N/A;  . UPPER GI ENDOSCOPY     Social History   Social History  . Marital status: Married    Spouse name: N/A  . Number of children: 0  . Years of education: N/A   Occupational History  . [employer]    Social History Main Topics  . Smoking status: Never Smoker  . Smokeless tobacco: Never Used  . Alcohol use No  . Drug use: No  . Sexual activity: Not Asked   Other Topics Concern  . None   Social History Narrative  . None   Family History  Problem Relation Age of Onset  . Stroke Mother   . Breast cancer Mother   . Cirrhosis Mother     wine  . Rheumatic fever Mother   . Kyphosis Mother   . Hypertension Father   . Stroke Father   . Heart disease Father   . Heart disease Paternal Grandfather   . Colon cancer Neg Hx       Review of Systems: Pertinent positive and negative review of systems were noted in the above HPI section. All other review of systems were otherwise negative.   Physical Exam: General: Well developed, well nourished, no acute distress Head: Normocephalic and atraumatic Eyes:  sclerae anicteric, EOMI Ears: Normal auditory acuity Mouth: No deformity or lesions Neck: Supple, no masses or thyromegaly Lungs: Clear throughout to auscultation Heart: Regular rate and rhythm; no murmurs, rubs or bruits Abdomen: Soft, non tender and non distended. No masses, hepatosplenomegaly or hernias noted. Normal Bowel sounds Musculoskeletal: Symmetrical with no gross deformities  Skin: No  lesions on visible extremities Pulses:  Normal pulses noted Extremities: No clubbing, cyanosis, edema or deformities noted Neurological: Alert oriented x 4, grossly nonfocal Cervical Nodes:  No significant cervical adenopathy Inguinal Nodes: No significant inguinal adenopathy Psychological:  Alert and cooperative. Normal mood and affect  Assessment and Recommendations:  1. GERD, epigastric pain, weight loss. Intensify antireflux measures. Avoid eating and drinking for at least 3 hours prior to bedtime. Continue omeprazole 20 mg by mouth every morning and begin ranitidine 150 mg at bedtime. Schedule abdominal/pelvic CT for further evaluation of abdominal pain and weight loss. If symptoms persist and no etiology on CT scan will try another PPI and likely will need to proceed with EGD.  2. Mild normocytic anemia.  Iron, TIBC, ferritin, B12 folate.   3. 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   4. Personal history of adenomatous colon polyps. Five-year interval colonoscopy is recommended in 2020 however he will be almost 82 and we will reevaluate at that time.

## 2016-08-06 NOTE — Patient Instructions (Addendum)
Your physician has requested that you go to the basement for lab work before leaving today.  We have sent the following medications to your pharmacy for you to pick up at your convenience: Zantac 150 mg at bedtime.  Patient advised to avoid spicy, acidic, citrus, chocolate, mints, fruit and fruit juices.  Limit the intake of caffeine, alcohol and Soda.  Don't exercise too soon after eating.  Don't lie down within 3-4 hours of eating.  Elevate the head of your bed.   You have been scheduled for a CT scan of the abdomen and pelvis at [place] ([place] are scheduled on 08/13/16 at [place] should arrive 15 minutes prior to your appointment time for registration. Please follow the written instructions below on the day of your exam:  WARNING: IF YOU ARE ALLERGIC TO IODINE/X-RAY DYE, PLEASE NOTIFY RADIOLOGY IMMEDIATELY AT [phone]! YOU WILL BE GIVEN A 13 HOUR PREMEDICATION PREP.  1) Do not eat or drink anything after 5:00am (4 hours prior to your test) 2) You have been given 2 bottles of oral contrast to drink. The solution may taste               better if refrigerated, but do NOT add ice or any other liquid to this solution. Shake             well before drinking.    Drink 1 bottle of contrast @ 7:00am (2 hours prior to your exam)  Drink 1 bottle of contrast @ 8:00am (1 hour prior to your exam)  You may take any medications as prescribed with a small amount of water except for the following: Metformin, Glucophage, Glucovance, Avandamet, Riomet, Fortamet, Actoplus Met, Janumet, Glumetza or Metaglip. The above medications must be held the day of the exam AND 48 hours after the exam.  The purpose of you drinking the oral contrast is to aid in the visualization of your intestinal tract. The contrast solution may cause some diarrhea. Before your exam is started, you will be given a small amount of fluid to drink. Depending on your individual set of symptoms, you may  also receive an intravenous injection of x-ray contrast/dye. Plan on being at [facility] for 30 minutes or longer, depending on the type of exam you are having performed.  This test typically takes 30-45 minutes to complete.  ________________________________________________________________________  Thank you for choosing me and [facility].  Pricilla Riffle. Dagoberto Ligas., MD., [facility] Regal

## 2016-08-07 NOTE — Progress Notes (Signed)
HPI: FU coronary artery disease. An echocardiogram in 2010 showed an ejection fraction of 50-55% with apical and septal hypokinesis. A Myoview was therefore performed and showed severe anterior ischemia and an ejection fraction of 47%. He therefore underwent cardiac catheterization on March 04, 2009 and this revealed severe three-vessel coronary disease as well as an 80% left main. His ejection fraction was 50%. He ultimately underwent coronary artery bypass and graft on November 1 (left internal mammary artery to left anterior descending, sequential saphenous vein graft to ramus intermedius and obtuse marginal 2, saphenous vein graft to posterior descending. Nuclear study January 2015 showed an ejection fraction of 50%. There is prior anterior, lateral and inferior infarct with mild peri-infarct ischemia. Treated medically. Echocardiogram November 2015 showed normal LV function, grade 1 diastolic dysfunction and moderate left atrial enlargement. Seen in ER 07/17/16 with nausea and back pain, troponin normal. Since he was last seen, he has mild dyspnea on exertion but no orthopnea, PND, pedal edema, exertional chest or back pain and no syncope  Current Outpatient Prescriptions  Medication Sig Dispense Refill  . acetaminophen (TYLENOL) 500 MG tablet Take 1,000 mg by mouth every 6 (six) hours as needed for mild pain or moderate pain.    Marland Kitchen albuterol (PROVENTIL HFA;VENTOLIN HFA) 108 (90 Base) MCG/ACT inhaler Inhale 1-2 puffs into the lungs every 6 (six) hours as needed for wheezing or shortness of breath.    Marland Kitchen alendronate (FOSAMAX) 70 MG tablet Take 1 tablet by mouth once a week.  3  . aspirin 325 MG EC tablet Take 325 mg by mouth daily.    Marland Kitchen atenolol (TENORMIN) 50 MG tablet TAKE 1 TABLET BY MOUTH EVERY DAY TO CONTROL HEART RHYTHM AND LOWER BLOOD PRESSURE 90 tablet 3  . Calcium Citrate 250 MG TABS Take 250 tablets by mouth daily.     . chlorpheniramine-HYDROcodone (TUSSIONEX PENNKINETIC ER) 10-8  MG/5ML SUER 5 cc every 12 hours if needed for cough 140 mL 0  . cholecalciferol (VITAMIN D) 1000 UNITS tablet Take 1,000 Units by mouth daily.      Marland Kitchen doxazosin (CARDURA) 4 MG tablet ONE DAILY TO HELP CONTROL BP 90 tablet 4  . finasteride (PROSCAR) 5 MG tablet Take 5 mg by mouth every morning.    . metoCLOPramide (REGLAN) 10 MG tablet Take 0.5 tablets (5 mg total) by mouth every 6 (six) hours as needed for nausea (nausea/headache). 6 tablet 0  . Multiple Vitamin (MULTIVITAMIN) tablet Take 1 tablet by mouth daily.      Marland Kitchen omeprazole (PRILOSEC) 20 MG capsule TAKE ONE CAPSULE BY MOUTH TO REDUCE STOMACH ACID 90 capsule 1  . Potassium 99 MG TABS Take 99 mg by mouth daily.    . Probiotic Product (PROBIOTIC DAILY PO) Take 2 capsules by mouth daily.     . ranitidine (ZANTAC) 150 MG tablet Take 1 tablet (150 mg total) by mouth at bedtime. 30 tablet 11  . rosuvastatin (CRESTOR) 20 MG tablet TAKE ONE TABLET BY MOUTH ONCE DAILY FOR CHOLESTEROL 90 tablet 1   No current facility-administered medications for this visit.      Past Medical History:  Diagnosis Date  . Acute gastric ulcer without mention of hemorrhage, perforation, or obstruction   . Allergic rhinitis, cause unspecified   . Anemia, unspecified   . Arthritis   . Asthma   . Blood transfusion without reported diagnosis   . CAD (coronary artery disease)   . Diaphragmatic hernia without mention of obstruction or gangrene   .  Elevated prostate specific antigen (PSA)   . Enlarged prostate   . Esophageal reflux   . Extrinsic asthma, unspecified   . Herpes zoster without mention of complication   . Hyperlipidemia   . Hypersomnia with sleep apnea, unspecified   . Impotence of organic origin   . Kyphosis   . Lumbago   . Obstructive sleep apnea (adult) (pediatric)   . Pneumonia    hx of years ago   . Senile osteoporosis   . Trigger finger (acquired)   . Tubular adenoma of colon 05/2013  . Unspecified essential hypertension   . Unspecified  sleep apnea    CPAP- settings 4-12   . Unspecified vitamin D deficiency     Past Surgical History:  Procedure Laterality Date  . CARDIAC CATHETERIZATION  03/04/2009   Dr [name]  . COLONOSCOPY    . CORONARY ARTERY BYPASS GRAFT  2010  . CYSTOSCOPY WITH RETROGRADE PYELOGRAM, URETEROSCOPY AND STENT PLACEMENT Right 10/04/2013   Procedure: CYSTOSCOPY WITH RETROGRADE PYELOGRAM,  AND STENT PLACEMENT;  Surgeon: Festus Aloe, MD;  Location: [facility];  Service: Urology;  Laterality: Right;  . MOLE REMOVAL  1958   chin  . ROTATOR CUFF REPAIR Left 04/1998   with bone spur removed, Dr French Ana  . TONSILLECTOMY  1945  . TRANSURETHRAL RESECTION OF PROSTATE N/A 09/24/2013   Procedure: TRANSURETHRAL RESECTION OF THE PROSTATE (TURP) WITH GYRUS (STAGED RIGHT LATERAL AND MEDIAN LOBE);  Surgeon: Ailene Rud, MD;  Location: [facility];  Service: Urology;  Laterality: N/A;  . UPPER GI ENDOSCOPY      Social History   Social History  . Marital status: Married    Spouse name: N/A  . Number of children: 0  . Years of education: N/A   Occupational History  . [employer]    Social History Main Topics  . Smoking status: Never Smoker  . Smokeless tobacco: Never Used  . Alcohol use No  . Drug use: No  . Sexual activity: Not on file   Other Topics Concern  . Not on file   Social History Narrative  . No narrative on file    Family History  Problem Relation Age of Onset  . Stroke Mother   . Breast cancer Mother   . Cirrhosis Mother     wine  . Rheumatic fever Mother   . Kyphosis Mother   . Hypertension Father   . Stroke Father   . Heart disease Father   . Heart disease Paternal Grandfather   . Colon cancer Neg Hx     ROS: no fevers or chills, productive cough, hemoptysis, dysphasia, odynophagia, melena, hematochezia, dysuria, hematuria, rash, seizure activity, orthopnea, PND, pedal edema, claudication. Remaining systems are negative.  Physical  Exam: Well-developed well-nourished in no acute distress.  Skin is warm and dry.  HEENT is normal.  Neck is supple. No bruits Chest is clear to auscultation with normal expansion.  Cardiovascular exam is regular rate and rhythm.  Abdominal exam nontender or distended. No masses palpated. Extremities show no edema. neuro grossly intact  ECG- 07/17/2016-sinus rhythm, incomplete left bundle branch block, septal infarct, lateral T-wave inversion. personally reviewed  A/P  1 Coronary artery disease-continue aspirin and statin. Given recent back pain (pt had back pain prior to CABG) we will arrange a nuclear study for risk stratification.  2 hyperlipidemia-continue statin.  3 hypertension- Blood pressure controlled. Continue present medications.   Kirk Ruths, MD

## 2016-08-13 ENCOUNTER — Ambulatory Visit (HOSPITAL_BASED_OUTPATIENT_CLINIC_OR_DEPARTMENT_OTHER)
Admission: RE | Admit: 2016-08-13 | Discharge: 2016-08-13 | Disposition: A | Discharge status: Home / Self Care | Payer: BLUE CROSS/BLUE SHIELD | Source: Ambulatory Visit | Attending: Gastroenterology | Admitting: Gastroenterology

## 2016-08-13 ENCOUNTER — Encounter (HOSPITAL_BASED_OUTPATIENT_CLINIC_OR_DEPARTMENT_OTHER): Payer: Self-pay

## 2016-08-13 DIAGNOSIS — I700 Atherosclerosis of aorta: Secondary | ICD-10-CM | POA: Diagnosis not present

## 2016-08-13 DIAGNOSIS — R1013 Epigastric pain: Secondary | ICD-10-CM | POA: Insufficient documentation

## 2016-08-13 DIAGNOSIS — K219 Gastro-esophageal reflux disease without esophagitis: Secondary | ICD-10-CM

## 2016-08-13 DIAGNOSIS — I2510 Atherosclerotic heart disease of native coronary artery without angina pectoris: Secondary | ICD-10-CM | POA: Insufficient documentation

## 2016-08-13 DIAGNOSIS — D649 Anemia, unspecified: Secondary | ICD-10-CM | POA: Diagnosis present

## 2016-08-13 DIAGNOSIS — N400 Enlarged prostate without lower urinary tract symptoms: Secondary | ICD-10-CM | POA: Insufficient documentation

## 2016-08-13 DIAGNOSIS — K8020 Calculus of gallbladder without cholecystitis without obstruction: Secondary | ICD-10-CM | POA: Diagnosis not present

## 2016-08-13 MED ORDER — IOPAMIDOL (ISOVUE-300) INJECTION 61%
100.0000 mL | Freq: Once | INTRAVENOUS | Status: AC | PRN
  Administered 2016-08-13: 100 mL via INTRAVENOUS

## 2016-08-15 ENCOUNTER — Ambulatory Visit (INDEPENDENT_AMBULATORY_CARE_PROVIDER_SITE_OTHER): Payer: BLUE CROSS/BLUE SHIELD | Admitting: Cardiology

## 2016-08-15 ENCOUNTER — Encounter: Payer: Self-pay | Admitting: Cardiology

## 2016-08-15 ENCOUNTER — Ambulatory Visit
Admission: RE | Admit: 2016-08-15 | Discharge: 2016-08-15 | Disposition: A | Discharge status: Home / Self Care | Payer: BLUE CROSS/BLUE SHIELD | Source: Ambulatory Visit | Attending: Sports Medicine | Admitting: Sports Medicine

## 2016-08-15 VITALS — BP 124/70 | HR 58 | Ht 63.5 in | Wt 150.0 lb

## 2016-08-15 DIAGNOSIS — I25810 Atherosclerosis of coronary artery bypass graft(s) without angina pectoris: Secondary | ICD-10-CM

## 2016-08-15 DIAGNOSIS — M810 Age-related osteoporosis without current pathological fracture: Secondary | ICD-10-CM

## 2016-08-15 DIAGNOSIS — E7800 Pure hypercholesterolemia, unspecified: Secondary | ICD-10-CM

## 2016-08-15 DIAGNOSIS — I10 Essential (primary) hypertension: Secondary | ICD-10-CM

## 2016-08-15 NOTE — Patient Instructions (Signed)
Medication Instructions:   NO CHANGE  Testing/Procedures:  Your physician has requested that you have a lexiscan myoview. For further information please visit [email or web address]. Please follow instruction sheet, as given.    Follow-Up:  Your physician wants you to follow-up in: 6 MONTHS WITH DR CRENSHAW You will receive a reminder letter in the mail two months in advance. If you don't receive a letter, please call our office to schedule the follow-up appointment.   If you need a refill on your cardiac medications before your next appointment, please call your pharmacy.    

## 2016-08-16 ENCOUNTER — Telehealth: Payer: Self-pay | Admitting: Gastroenterology

## 2016-08-16 NOTE — Telephone Encounter (Signed)
noted 

## 2016-08-16 NOTE — Telephone Encounter (Signed)
B[facility] called to let us know they did receive referral for patient, and will call them to set up an appointment.

## 2016-08-17 ENCOUNTER — Telehealth: Payer: Self-pay | Admitting: Hematology & Oncology

## 2016-08-17 NOTE — Telephone Encounter (Signed)
Per order to sch [facility].  Apt was sch for 09/14/16.  I called to give apt date and time, there was no answer so I left detail message.

## 2016-08-23 ENCOUNTER — Other Ambulatory Visit: Payer: Self-pay | Admitting: Family

## 2016-08-23 ENCOUNTER — Other Ambulatory Visit (HOSPITAL_BASED_OUTPATIENT_CLINIC_OR_DEPARTMENT_OTHER): Payer: BLUE CROSS/BLUE SHIELD

## 2016-08-23 ENCOUNTER — Ambulatory Visit (HOSPITAL_BASED_OUTPATIENT_CLINIC_OR_DEPARTMENT_OTHER): Payer: BLUE CROSS/BLUE SHIELD | Admitting: Family

## 2016-08-23 ENCOUNTER — Telehealth (HOSPITAL_COMMUNITY): Payer: Self-pay

## 2016-08-23 ENCOUNTER — Ambulatory Visit: Payer: BLUE CROSS/BLUE SHIELD

## 2016-08-23 VITALS — BP 131/62 | HR 57 | Temp 97.4°F | Resp 16 | Wt 150.0 lb

## 2016-08-23 DIAGNOSIS — D649 Anemia, unspecified: Secondary | ICD-10-CM

## 2016-08-23 DIAGNOSIS — D561 Beta thalassemia: Secondary | ICD-10-CM

## 2016-08-23 LAB — CBC WITH DIFFERENTIAL (CANCER CENTER ONLY)
BASO#: 0 10*3/uL (ref 0.0–0.2)
BASO%: 0.3 % (ref 0.0–2.0)
EOS%: 1.7 % (ref 0.0–7.0)
Eosinophils Absolute: 0.1 10*3/uL (ref 0.0–0.5)
HCT: 37.2 % — ABNORMAL LOW (ref 38.7–49.9)
HGB: 12.5 g/dL — ABNORMAL LOW (ref 13.0–17.1)
LYMPH#: 1.3 10*3/uL (ref 0.9–3.3)
LYMPH%: 20.4 % (ref 14.0–48.0)
MCH: 29.8 pg (ref 28.0–33.4)
MCHC: 33.6 g/dL (ref 32.0–35.9)
MCV: 89 fL (ref 82–98)
MONO#: 0.5 10*3/uL (ref 0.1–0.9)
MONO%: 7.1 % (ref 0.0–13.0)
NEUT%: 70.5 % (ref 40.0–80.0)
NEUTROS ABS: 4.5 10*3/uL (ref 1.5–6.5)
Platelets: 178 10*3/uL (ref 145–400)
RBC: 4.2 10*6/uL (ref 4.20–5.70)
RDW: 12.5 % (ref 11.1–15.7)
WBC: 6.4 10*3/uL (ref 4.0–10.0)

## 2016-08-23 LAB — COMPREHENSIVE METABOLIC PANEL (CC13)
A/G RATIO: 1.5 (ref 1.2–2.2)
ALT: 15 IU/L (ref 0–44)
AST: 18 IU/L (ref 0–40)
Albumin, Serum: 4 g/dL (ref 3.5–4.8)
Alkaline Phosphatase, S: 45 IU/L (ref 39–117)
BILIRUBIN TOTAL: 1.2 mg/dL (ref 0.0–1.2)
BUN/Creatinine Ratio: 15 (ref 10–24)
BUN: 14 mg/dL (ref 8–27)
Calcium, Ser: 8.7 mg/dL (ref 8.6–10.2)
Carbon Dioxide, Total: 26 mmol/L (ref 18–29)
Chloride, Ser: 103 mmol/L (ref 96–106)
Creatinine, Ser: 0.96 mg/dL (ref 0.76–1.27)
GFR calc Af Amer: 87 mL/min/{1.73_m2} (ref >59)
GFR calc non Af Amer: 75 mL/min/{1.73_m2} (ref >59)
Globulin, Total: 2.6 g/dL (ref 1.5–4.5)
Glucose: 112 mg/dL — ABNORMAL HIGH (ref 65–99)
POTASSIUM: 4.1 mmol/L (ref 3.5–5.2)
Sodium: 135 mmol/L (ref 134–144)
Total Protein: 6.6 g/dL (ref 6.0–8.5)

## 2016-08-23 LAB — CHCC SATELLITE - SMEAR

## 2016-08-23 LAB — LACTATE DEHYDROGENASE: LDH: 175 U/L (ref 125–245)

## 2016-08-23 MED ORDER — FOLIC ACID 1 MG PO TABS: 1.0000 mg | ORAL_TABLET | Freq: Every day | ORAL | 4 refills | Status: DC

## 2016-08-23 NOTE — Progress Notes (Signed)
Hematology/Oncology Consultation   Name: Brent Griffith      MRN: [id number]    Location: Room/bed info not found  Date: 08/23/2016 Time:1:43 PM   REFERRING PHYSICIAN: Kennedy Bucker, MD  REASON FOR CONSULT: Anemia   DIAGNOSIS:  1. Low ferritin  HISTORY OF PRESENT ILLNESS: Brent Griffith is a very pleasant 81 yo gentleman of [place] descent with history of mild anemia. He is asymptomatic with this so far.  We have checked a hemoglobinopathy evaluation to assess for possible Beta Thalassemia.  He is not currently on folic acid.  No family history of anemia that he is aware of.  Recent ferritin was 16 with an iron saturation of 34%. Hgb is stable at 12.5 with an MCV of 89. He takes prevacid and reglan for GERD which is likely blocking the absorption of iron in his diet.  Colonoscopy with Dr. Fuller Plan in 2015 revealed 1 small tubular adenoma, diverticulosis and internal hemorrhoids.  He denies any episodes of bleeding, bruising or petechiae. No lymphadenopathy found on exam.  He has history of cholelithiasis. He had a CABG x 4 in 2010.  No problem with frequent infections. No fever, chills, n/v, cough, ice cravings, rash, oral sores, dizziness, headache, vision changes, SOB, chest pain, palpitations, abdominal pain or changes in bowel or bladder habits.  No swelling, tenderness, numbness or tingling in his extremities. No c/o pain at this time.  Both he and his wife are trying to eat healthier and lose weight. He is staying well hydrated. Weight is stable. 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 He is originally from right outside [place], [place].   ROS: All other 10 point review of systems is negative.   PAST MEDICAL HISTORY:   Past Medical History:  Diagnosis Date  . Acute gastric ulcer without mention of hemorrhage, perforation, or obstruction   . Allergic rhinitis, cause unspecified   . Anemia, unspecified   . Arthritis   . Asthma   . Blood transfusion without reported  diagnosis   . CAD (coronary artery disease)   . Diaphragmatic hernia without mention of obstruction or gangrene   . Elevated prostate specific antigen (PSA)   . Enlarged prostate   . Esophageal reflux   . Extrinsic asthma, unspecified   . Herpes zoster without mention of complication   . Hyperlipidemia   . Hypersomnia with sleep apnea, unspecified   . Impotence of organic origin   . Kyphosis   . Lumbago   . Obstructive sleep apnea (adult) (pediatric)   . Pneumonia    hx of years ago   . Senile osteoporosis   . Trigger finger (acquired)   . Tubular adenoma of colon 05/2013  . Unspecified essential hypertension   . Unspecified sleep apnea    CPAP- settings 4-12   . Unspecified vitamin D deficiency     ALLERGIES: Allergies  Allergen Reactions  . Compazine [Prochlorperazine Edisylate] Anxiety      MEDICATIONS:  Current Outpatient Prescriptions on File Prior to Visit  Medication Sig Dispense Refill  . acetaminophen (TYLENOL) 500 MG tablet Take 1,000 mg by mouth every 6 (six) hours as needed for mild pain or moderate pain.    Marland Kitchen albuterol (PROVENTIL HFA;VENTOLIN HFA) 108 (90 Base) MCG/ACT inhaler Inhale 1-2 puffs into the lungs every 6 (six) hours as needed for wheezing or shortness of breath.    Marland Kitchen alendronate (FOSAMAX) 70 MG tablet Take 1 tablet by mouth once a week.  3  . aspirin  325 MG EC tablet Take 325 mg by mouth daily.    Marland Kitchen atenolol (TENORMIN) 50 MG tablet TAKE 1 TABLET BY MOUTH EVERY DAY TO CONTROL HEART RHYTHM AND LOWER BLOOD PRESSURE 90 tablet 3  . Calcium Citrate 250 MG TABS Take 250 tablets by mouth daily.     . chlorpheniramine-HYDROcodone (TUSSIONEX PENNKINETIC ER) 10-8 MG/5ML SUER 5 cc every 12 hours if needed for cough 140 mL 0  . cholecalciferol (VITAMIN D) 1000 UNITS tablet Take 1,000 Units by mouth daily.      Marland Kitchen doxazosin (CARDURA) 4 MG tablet ONE DAILY TO HELP CONTROL BP 90 tablet 4  . finasteride (PROSCAR) 5 MG tablet Take 5 mg by mouth every morning.    .  metoCLOPramide (REGLAN) 10 MG tablet Take 0.5 tablets (5 mg total) by mouth every 6 (six) hours as needed for nausea (nausea/headache). 6 tablet 0  . Multiple Vitamin (MULTIVITAMIN) tablet Take 1 tablet by mouth daily.      Marland Kitchen omeprazole (PRILOSEC) 20 MG capsule TAKE ONE CAPSULE BY MOUTH TO REDUCE STOMACH ACID 90 capsule 1  . Potassium 99 MG TABS Take 99 mg by mouth daily.    . Probiotic Product (PROBIOTIC DAILY PO) Take 2 capsules by mouth daily.     . ranitidine (ZANTAC) 150 MG tablet Take 1 tablet (150 mg total) by mouth at bedtime. 30 tablet 11  . rosuvastatin (CRESTOR) 20 MG tablet TAKE ONE TABLET BY MOUTH ONCE DAILY FOR CHOLESTEROL 90 tablet 1   No current facility-administered medications on file prior to visit.      PAST SURGICAL HISTORY Past Surgical History:  Procedure Laterality Date  . CARDIAC CATHETERIZATION  03/04/2009   Dr [name]  . COLONOSCOPY    . CORONARY ARTERY BYPASS GRAFT  2010  . CYSTOSCOPY WITH RETROGRADE PYELOGRAM, URETEROSCOPY AND STENT PLACEMENT Right 10/04/2013   Procedure: CYSTOSCOPY WITH RETROGRADE PYELOGRAM,  AND STENT PLACEMENT;  Surgeon: Festus Aloe, MD;  Location: [facility];  Service: Urology;  Laterality: Right;  . MOLE REMOVAL  1958   chin  . ROTATOR CUFF REPAIR Left 04/1998   with bone spur removed, Dr French Ana  . TONSILLECTOMY  1945  . TRANSURETHRAL RESECTION OF PROSTATE N/A 09/24/2013   Procedure: TRANSURETHRAL RESECTION OF THE PROSTATE (TURP) WITH GYRUS (STAGED RIGHT LATERAL AND MEDIAN LOBE);  Surgeon: Ailene Rud, MD;  Location: [facility];  Service: Urology;  Laterality: N/A;  . UPPER GI ENDOSCOPY      FAMILY HISTORY: Family History  Problem Relation Age of Onset  . Stroke Mother   . Breast cancer Mother   . Cirrhosis Mother     wine  . Rheumatic fever Mother   . Kyphosis Mother   . Hypertension Father   . Stroke Father   . Heart disease Father   . Heart disease Paternal Grandfather   . Colon cancer Neg Hx     SOCIAL  HISTORY:  reports that he has never smoked. He has never used smokeless tobacco. He reports that he does not drink alcohol or use drugs.  PERFORMANCE STATUS: The patient's performance status is 0 - Asymptomatic  PHYSICAL EXAM: Most Recent Vital Signs: Blood pressure 131/62, pulse (!) 57, temperature 97.4 F (36.3 C), temperature source Oral, resp. rate 16, weight 150 lb (68 kg), SpO2 98 %. BP 131/62 (BP Location: Right Arm, Patient Position: Sitting)   Pulse (!) 57   Temp 97.4 F (36.3 C) (Oral)   Resp 16   Wt 150 lb (68 kg)  SpO2 98%   BMI 26.15 kg/m   General Appearance:    Alert, cooperative, no distress, appears stated age  Head:    Normocephalic, without obvious abnormality, atraumatic  Eyes:    PERRL, conjunctiva/corneas clear, EOM's intact, fundi    benign, both eyes             Throat:   Lips, mucosa, and tongue normal; teeth and gums normal  Neck:   Supple, symmetrical, trachea midline, no adenopathy;       thyroid:  No enlargement/tenderness/nodules; no carotid   bruit or JVD  Back:     Symmetric, no curvature, ROM normal, no CVA tenderness  Lungs:     Clear to auscultation bilaterally, respirations unlabored  Chest wall:    No tenderness or deformity  Heart:    Regular rate and rhythm, S1 and S2 normal, no murmur, rub   or gallop  Abdomen:     Soft, non-tender, bowel sounds active all four quadrants,    no masses, no organomegaly        Extremities:   Extremities normal, atraumatic, no cyanosis or edema  Pulses:   2+ and symmetric all extremities  Skin:   Skin color, texture, turgor normal, no rashes or lesions  Lymph nodes:   Cervical, supraclavicular, and axillary nodes normal  Neurologic:   CNII-XII intact. Normal strength, sensation and reflexes      throughout    LABORATORY DATA:  Results for orders placed or performed in visit on 08/23/16 (from the past 48 hour(s))  CBC w/Diff     Status: Abnormal   Collection Time: 08/23/16  1:12 PM  Result Value  Ref Range   WBC 6.4 4.0 - 10.0 10e3/uL   RBC 4.20 4.20 - 5.70 10e6/uL   HGB 12.5 (L) 13.0 - 17.1 g/dL   HCT 37.2 (L) 38.7 - 49.9 %   MCV 89 82 - 98 fL   MCH 29.8 28.0 - 33.4 pg   MCHC 33.6 32.0 - 35.9 g/dL   RDW 12.5 11.1 - 15.7 %   Platelets 178 145 - 400 10e3/uL   NEUT# 4.5 1.5 - 6.5 10e3/uL   LYMPH# 1.3 0.9 - 3.3 10e3/uL   MONO# 0.5 0.1 - 0.9 10e3/uL   Eosinophils Absolute 0.1 0.0 - 0.5 10e3/uL   BASO# 0.0 0.0 - 0.2 10e3/uL   NEUT% 70.5 40.0 - 80.0 %   LYMPH% 20.4 14.0 - 48.0 %   MONO% 7.1 0.0 - 13.0 %   EOS% 1.7 0.0 - 7.0 %   BASO% 0.3 0.0 - 2.0 %  Smear     Status: None   Collection Time: 08/23/16  1:12 PM  Result Value Ref Range   Smear Result Smear Available       RADIOGRAPHY: No results found.     PATHOLOGY: None  ASSESSMENT/PLAN: Brent Griffith is a very pleasant 81 yo gentleman of [place] descent with history of mild anemia. He is asymptomatic with this so far. We will check him for Beta Thalassemia.  He will go ahead and start taking folic acid 1 mg PO daily. Hemoglobinopathy eval and iron studies are pending.  We will plan bring him back in next week for IV iron if needed.  We spent at least 30 minutes face to face counseling the patient and his wife. All questions were answered. Both he and his wife know to contact our office with any questions or concerns. We can certainly see the patient much sooner if necessary. We  will plan to see him back in 4 months for repeat lab work and follow-up.   He was discussed with and also seen by Dr. Marin Olp and he is in agreement with the aforementioned.   [facility]    Addendum:  I agree with the above assessment by Judson Roch. His iron studies are little bit on the low side. His ferritin is 18. His iron saturation is 25%. His serum iron is 96.  We will check a hemoglobinopathy on him. He is of [place] dissent. I will make sure he does not have thalassemia which might be intruding into the minimal anemia. He does  not have a low MCV which I would expect for thalassemia.  He is pretty much asymptomatic. I think we can just watch him for right now.  It would be nice to get him back to the office in about 4 months for follow-up.  We spent about 40 minutes with he and his wife. There are both very nice.  Lattie Haw, MD

## 2016-08-23 NOTE — Telephone Encounter (Signed)
Encounter complete. 

## 2016-08-24 LAB — IRON AND TIBC
%SAT: 25 % (ref 20–55)
IRON: 96 ug/dL (ref 42–163)
TIBC: 388 ug/dL (ref 202–409)
UIBC: 292 ug/dL (ref 117–376)

## 2016-08-24 LAB — RETICULOCYTES: Reticulocyte Count: 1 % (ref 0.6–2.6)

## 2016-08-24 LAB — FERRITIN: FERRITIN: 18 ng/mL — AB (ref 22–316)

## 2016-08-27 LAB — HEMOGLOBINOPATHY EVALUATION
HEMOGLOBIN F QUANTITATION: 0 % (ref 0.0–2.0)
HGB C: 0 %
HGB S: 0 %
HGB VARIANT: 0 %
Hemoglobin A2 Quantitation: 2.2 % (ref 1.8–3.2)
Hgb A: 97.8 % (ref 96.4–98.8)

## 2016-08-28 ENCOUNTER — Encounter: Payer: Self-pay | Admitting: Internal Medicine

## 2016-08-28 ENCOUNTER — Ambulatory Visit (INDEPENDENT_AMBULATORY_CARE_PROVIDER_SITE_OTHER): Payer: BLUE CROSS/BLUE SHIELD | Admitting: Internal Medicine

## 2016-08-28 ENCOUNTER — Ambulatory Visit (HOSPITAL_COMMUNITY)
Admission: RE | Admit: 2016-08-28 | Discharge: 2016-08-28 | Disposition: A | Discharge status: Home / Self Care | Payer: BLUE CROSS/BLUE SHIELD | Source: Ambulatory Visit | Attending: Cardiovascular Disease | Admitting: Cardiovascular Disease

## 2016-08-28 DIAGNOSIS — R05 Cough: Secondary | ICD-10-CM

## 2016-08-28 DIAGNOSIS — J3089 Other allergic rhinitis: Secondary | ICD-10-CM | POA: Diagnosis not present

## 2016-08-28 DIAGNOSIS — I10 Essential (primary) hypertension: Secondary | ICD-10-CM | POA: Diagnosis not present

## 2016-08-28 DIAGNOSIS — Z8249 Family history of ischemic heart disease and other diseases of the circulatory system: Secondary | ICD-10-CM | POA: Insufficient documentation

## 2016-08-28 DIAGNOSIS — K219 Gastro-esophageal reflux disease without esophagitis: Secondary | ICD-10-CM | POA: Insufficient documentation

## 2016-08-28 DIAGNOSIS — G4733 Obstructive sleep apnea (adult) (pediatric): Secondary | ICD-10-CM

## 2016-08-28 DIAGNOSIS — J45909 Unspecified asthma, uncomplicated: Secondary | ICD-10-CM | POA: Diagnosis not present

## 2016-08-28 DIAGNOSIS — I25810 Atherosclerosis of coronary artery bypass graft(s) without angina pectoris: Secondary | ICD-10-CM

## 2016-08-28 DIAGNOSIS — I447 Left bundle-branch block, unspecified: Secondary | ICD-10-CM | POA: Diagnosis not present

## 2016-08-28 DIAGNOSIS — R053 Chronic cough: Secondary | ICD-10-CM

## 2016-08-28 DIAGNOSIS — J302 Other seasonal allergic rhinitis: Secondary | ICD-10-CM | POA: Diagnosis not present

## 2016-08-28 DIAGNOSIS — Z951 Presence of aortocoronary bypass graft: Secondary | ICD-10-CM | POA: Insufficient documentation

## 2016-08-28 DIAGNOSIS — I252 Old myocardial infarction: Secondary | ICD-10-CM | POA: Insufficient documentation

## 2016-08-28 DIAGNOSIS — J449 Chronic obstructive pulmonary disease, unspecified: Secondary | ICD-10-CM | POA: Insufficient documentation

## 2016-08-28 LAB — BASIC METABOLIC PANEL
BUN: 10 mg/dL (ref 4–21)
Creatinine: 0.8 mg/dL (ref 0.6–1.3)
Glucose: 92 mg/dL
Potassium: 4.5 mmol/L (ref 3.4–5.3)
SODIUM: 143 mmol/L (ref 137–147)

## 2016-08-28 LAB — MYOCARDIAL PERFUSION IMAGING
CHL CUP NUCLEAR SDS: 3
CHL CUP NUCLEAR SRS: 10
CHL CUP NUCLEAR SSS: 13
CSEPPHR: 84 {beats}/min
LV dias vol: 134 mL (ref 62–150)
LV sys vol: 69 mL
Rest HR: 52 {beats}/min
TID: 1.16

## 2016-08-28 LAB — HEPATIC FUNCTION PANEL
ALK PHOS: 45 U/L (ref 25–125)
ALT: 12 U/L (ref 10–40)
AST: 18 U/L (ref 14–40)
Bilirubin, Total: 1.3 mg/dL

## 2016-08-28 LAB — PSA: PSA: 1.4

## 2016-08-28 LAB — CBC AND DIFFERENTIAL
HEMATOCRIT: 39 % — AB (ref 41–53)
Hemoglobin: 13.1 g/dL — AB (ref 13.5–17.5)
Platelets: 201 10*3/uL (ref 150–399)
WBC: 5.9 10^3/mL

## 2016-08-28 LAB — LIPID PANEL
CHOLESTEROL: 125 mg/dL (ref 0–200)
HDL: 59 mg/dL (ref 35–70)
LDL Cholesterol: 52 mg/dL
TRIGLYCERIDES: 70 mg/dL (ref 40–160)

## 2016-08-28 MED ORDER — AZELASTINE-FLUTICASONE 137-50 MCG/ACT NA SUSP: 2.0000 | Freq: Every day | NASAL | 0 refills | Status: DC

## 2016-08-28 MED ORDER — TECHNETIUM TC 99M TETROFOSMIN IV KIT
32.0000 | PACK | Freq: Once | INTRAVENOUS | Status: AC | PRN
  Administered 2016-08-28: 32 via INTRAVENOUS
  Filled 2016-08-28: qty 32

## 2016-08-28 MED ORDER — REGADENOSON 0.4 MG/5ML IV SOLN
0.4000 mg | Freq: Once | INTRAVENOUS | Status: AC
  Administered 2016-08-28: 0.4 mg via INTRAVENOUS

## 2016-08-28 MED ORDER — TECHNETIUM TC 99M TETROFOSMIN IV KIT
10.5000 | PACK | Freq: Once | INTRAVENOUS | Status: AC | PRN
  Administered 2016-08-28: 10.5 via INTRAVENOUS
  Filled 2016-08-28: qty 11

## 2016-08-28 NOTE — Assessment & Plan Note (Signed)
We're going to try a sample of Dymista nasal spray to see if that does better than Flonase for his bothersome postnasal drip. He can also compare this with OTC antihistamine like Claritin.

## 2016-08-28 NOTE — Assessment & Plan Note (Signed)
He says he coughs now only if he is bothered by postnasal drip which is seasonal. Otherwise he is not aware of shortness of breath and he denies wheezing. He is scheduled for exercise stress test for cardiology today.

## 2016-08-28 NOTE — Progress Notes (Signed)
Subjective:    Patient ID: Brent Griffith, male    DOB: [birth year], 81 y.o.   MRN: [id number]  HPI  male never smoker followed for OSA, periodic limb movement, complicated by CAD/CABG,                   NPSG 12/15/04- AHI  9.2/ hr, desaturation to 76%, PLMA 13.6/ hr, body weight 170 lbs Heart surgery Nov 2010 CABG 4v PFT: 05/04/2014: Normal spirometry flows, insignificant response to bronchodilator, moderate diffusion defect 59% of predicted, possible air trapping ---------------------------------------------------------------------------------------------------------------------  07/07/2015-81 year old male never smoker followed for OSA, periodic limb movement, complicated by CAD/CABG,     Wife here               Had flu shot this fall CPAP auto 6-12/[facility] visit 07/05/15- laryngospasm-referred to ENT CXR 07/05/2015-NAD He thinks he and his wife have both had a flu like illness. 2 or 3 days ago onset with initial temperature 100.2, myalgias, dry cough. This cough was associated with laryngospasm event for which he went to ER. Has appointment pending with Dr. Lovenia Shuck occasional sensation of esophageal spasm but not clearly noting reflux events. Says only Tussionex has helped his cough, benzonatate does not.  08/28/16- 81 year old male never smoker followed for OSA, periodic limb movement, complicated by CAD/CABG, Dr Donneta Romberg for allergic asthma    Wife here CPAP auto 6-12/[facility] FOLLOWS FOR: [facility]. Pt wears CPAP nightly and recently got new supplies. DL attached. He feels very comfortable using CPAP and wife confirms he uses it every night. Download 100% 4 hour compliance with AHI 3.0/hour  He has not used inhalers in a very long time. We discussed role diagnosis of COPD on his chart, not supported by any specific evidence to I am removing it and but admits little shortness of breath and no wheezing. Cough only with postnasal drip which is seasonal. He attributes occasional hoarseness  to both postnasal drip and his GERD. Pending stress test today for known CAD.  Review of Systems- see HPI Constitutional:   No-   weight loss, night sweats, fevers, chills, fatigue, lassitude. HEENT:   No-   headaches, difficulty swallowing, tooth/dental problems, sore throat,                  No-   sneezing, itching, ear ache, nasal congestion, post nasal drip,  CV:  No-   chest pain, orthopnea, PND, swelling in lower extremities, anasarca,dizziness, palpitations GI:  No-   heartburn, indigestion, abdominal pain, nausea, vomiting,  Resp: +  shortness of breath with exertion or at rest.  No-  excess mucus,             No-   productive cough,  + non-productive cough,  No-  coughing up of blood.              No-   change in color of mucus.  No- wheezing.   Skin: No-   rash or lesions. GU: . MS:  No-   joint pain or swelling. Marland Kitchen Psych:  No- change in mood or affect. No depression or anxiety.  No memory loss.    Objective:   General- Alert, Oriented, Affect-appropriate, Distress- none acute. Slender Skin- rash-none, lesions- none, excoriation- none Lymphadenopathy- none Head- atraumatic            Eyes- Gross vision intact, PERRLA, conjunctivae clear secretions            Ears- Hearing, canals normal  Nose- Clear, +Septal dev/ external nasal deviation,  No-mucus, polyps, erosion, perforation             Throat- Mallampati II-III , mucosa clear , drainage- none, tonsils- atrophic Neck- flexible , trachea midline, no stridor , thyroid nl, carotid no bruit Chest - symmetrical excursion , unlabored           Heart/CV- RRR , no murmur , no gallop  , no rub, nl s1 s2                           - JVD- none , edema- none, stasis changes- none, varices- none           Lung- clear to P&A, wheeze- none, cough + one episode while here , dullness-none, rub- none           Chest wall-  Abd-  Br/ Gen/ Rectal- Not done, not indicated Extrem- cyanosis- none, clubbing, none, atrophy- none,  strength- nl Neuro- grossly intact to observation    Assessment & Plan:

## 2016-08-28 NOTE — Assessment & Plan Note (Signed)
He is aware that he refluxes occasionally and we reinforced reflux precautions.

## 2016-08-28 NOTE — Assessment & Plan Note (Signed)
Download and also his wife confirm excellent compliance and control. He feels better using CPAP and is comfortable continuing. No changes required. Machine is about 79 years old.

## 2016-08-28 NOTE — Patient Instructions (Addendum)
We can continue CPAP auto 6-12, mask of choice, humidifier, supplies, AirView   Dx OSA  Sample Dymista nasal spray     Try 1-2 puffs each nostril once daily   See if this helps the drainage better than flonase  Please call if needed

## 2016-08-29 ENCOUNTER — Telehealth: Payer: Self-pay | Admitting: *Deleted

## 2016-08-29 NOTE — Telephone Encounter (Addendum)
Patient is aware of results. Appointment made  ----- Message from Eliezer Bottom, NP sent at 08/29/2016  9:49 AM EDT ----- Regarding: Iron  Patient's ferritin is low. He will need one dose of IV iron please. LOS sent to [facility]. Thank you!  Sarah  ----- Message ----- From: Interface, Lab In Three Zero One Sent: 08/23/2016   1:26 PM To: Eliezer Bottom, NP

## 2016-08-30 ENCOUNTER — Ambulatory Visit: Payer: BLUE CROSS/BLUE SHIELD

## 2016-08-31 ENCOUNTER — Other Ambulatory Visit: Payer: Self-pay | Admitting: Family

## 2016-08-31 ENCOUNTER — Ambulatory Visit (HOSPITAL_BASED_OUTPATIENT_CLINIC_OR_DEPARTMENT_OTHER): Payer: BLUE CROSS/BLUE SHIELD

## 2016-08-31 VITALS — BP 138/59 | HR 57 | Temp 97.6°F | Resp 18

## 2016-08-31 DIAGNOSIS — D509 Iron deficiency anemia, unspecified: Secondary | ICD-10-CM

## 2016-08-31 DIAGNOSIS — D508 Other iron deficiency anemias: Secondary | ICD-10-CM

## 2016-08-31 MED ORDER — SODIUM CHLORIDE 0.9 % IV SOLN
Freq: Once | INTRAVENOUS | Status: AC
  Administered 2016-08-31: 15:00:00 via INTRAVENOUS

## 2016-08-31 MED ORDER — FERUMOXYTOL INJECTION 510 MG/17 ML
510.0000 mg | Freq: Once | INTRAVENOUS | Status: AC
  Administered 2016-08-31: 510 mg via INTRAVENOUS
  Filled 2016-08-31: qty 17

## 2016-08-31 NOTE — Patient Instructions (Signed)

## 2016-09-07 ENCOUNTER — Other Ambulatory Visit: Payer: Self-pay | Admitting: Internal Medicine

## 2016-09-07 DIAGNOSIS — K219 Gastro-esophageal reflux disease without esophagitis: Secondary | ICD-10-CM

## 2016-09-10 ENCOUNTER — Encounter: Payer: Self-pay | Admitting: *Deleted

## 2016-09-10 ENCOUNTER — Ambulatory Visit
Admission: RE | Admit: 2016-09-10 | Discharge: 2016-09-10 | Disposition: A | Discharge status: Home / Self Care | Payer: BLUE CROSS/BLUE SHIELD | Source: Ambulatory Visit | Attending: Sports Medicine | Admitting: Sports Medicine

## 2016-09-10 ENCOUNTER — Ambulatory Visit (INDEPENDENT_AMBULATORY_CARE_PROVIDER_SITE_OTHER): Payer: BLUE CROSS/BLUE SHIELD | Admitting: Gastroenterology

## 2016-09-10 ENCOUNTER — Encounter: Payer: Self-pay | Admitting: Gastroenterology

## 2016-09-10 VITALS — BP 126/72 | Ht 63.0 in | Wt 149.0 lb

## 2016-09-10 DIAGNOSIS — K219 Gastro-esophageal reflux disease without esophagitis: Secondary | ICD-10-CM

## 2016-09-10 DIAGNOSIS — K8020 Calculus of gallbladder without cholecystitis without obstruction: Secondary | ICD-10-CM

## 2016-09-10 DIAGNOSIS — Z86010 Personal history of colonic polyps: Secondary | ICD-10-CM | POA: Diagnosis not present

## 2016-09-10 MED ORDER — RANITIDINE HCL 150 MG PO TABS: 150.0000 mg | ORAL_TABLET | Freq: Every day | ORAL | 3 refills | Status: DC

## 2016-09-10 NOTE — Progress Notes (Signed)
    History of Present Illness: This is a 81 year old male returning for GERD, weight loss and epigastric abdominal pain. He states his abdominal pain and reflux symptoms are no longer bothering him. His appetite is good and his weight is stabilized. We reviewed findings from his CT scan as outlined below. He was evaluated by Dr. Stanford Breed and underwent a myocardial perfusion study on April 24.  abd/pelvic 4/9/2-18 CT IMPRESSION: 1. No acute findings in the abdomen or pelvis to account for the patient's symptoms. 2. Cholelithiasis, without evidence to suggest an acute cholecystitis at this time. 3. Aortic atherosclerosis, in addition to 2 vessel coronary artery disease. Assessment for potential risk factor modification, dietary therapy or pharmacologic therapy may be warranted, if clinically indicated. 4. Normal appendix. 5. Prostatomegaly with median lobe hypertrophy. 6. Additional incidental findings, as above.  Current Medications, Allergies, Past Medical History, Past Surgical History, Family History and Social History were reviewed in [employer] record.  Physical Exam: General: Well developed, well nourished, no acute distress Head: Normocephalic and atraumatic Eyes:  sclerae anicteric, EOMI Ears: Normal auditory acuity Mouth: No deformity or lesions Lungs: Clear throughout to auscultation Heart: Regular rate and rhythm; no murmurs, rubs or bruits Abdomen: Soft, non tender and non distended. No masses, hepatosplenomegaly or hernias noted. Normal Bowel sounds Musculoskeletal: Symmetrical with no gross deformities  Pulses:  Normal pulses noted Extremities: No clubbing, cyanosis, edema or deformities noted Neurological: Alert oriented x 4, grossly nonfocal Psychological:  Alert and cooperative. Normal mood and affect  Assessment and Recommendations:  1. GERD. Continue omeprazole 20 mg by mouth every morning and ranitidine 150 mg by mouth every afternoon. Follow  standard antireflux measures.  2. Cholelithiasis. Possibly symptomatic however no symptoms over the past few weeks. Advised to call if he has recurrent problems with abdominal pain especially epigastric and right upper quadrant pain.  3. Personal history of adenomatous colon polyps. Surveillance colonoscopy recommended in January 2020 however he will be almost 82 and we will reevaluate his health status at that time.

## 2016-09-10 NOTE — Patient Instructions (Signed)
We have sent the following medications to your pharmacy for you to pick up at your convenience:ranitidine 150 mg at bedtime.   Continue your omeprazole daily.   Thank you for choosing me and [facility].  Pricilla Riffle. Dagoberto Ligas., MD., Marval Regal

## 2016-09-14 ENCOUNTER — Other Ambulatory Visit: Payer: BLUE CROSS/BLUE SHIELD

## 2016-09-14 ENCOUNTER — Ambulatory Visit: Payer: BLUE CROSS/BLUE SHIELD

## 2016-09-14 ENCOUNTER — Ambulatory Visit: Payer: BLUE CROSS/BLUE SHIELD | Admitting: Hematology & Oncology

## 2016-09-17 ENCOUNTER — Other Ambulatory Visit: Payer: BLUE CROSS/BLUE SHIELD

## 2016-09-19 ENCOUNTER — Ambulatory Visit: Payer: BLUE CROSS/BLUE SHIELD | Admitting: Internal Medicine

## 2016-09-27 ENCOUNTER — Telehealth: Payer: Self-pay | Admitting: Cardiology

## 2016-09-27 NOTE — Telephone Encounter (Signed)
1. What dental office are you calling from? Dr.Pat[facility])   2. What is your office phone and fax number? Office [phone] Fax # [phone]  3. What type of procedure is the patient having performed? Teeth extractions  4. What date is procedure scheduled? Not scheduled yet   5. What is your question (ex. Antibiotics prior to procedure, holding medication-we need to know how long dentist wants pt to hold med)? Patient currently taking aspirin 325 mg and Dr. Sharlett [facility] would like to know if he can reduce medifine  to 81 mg for the tooth extractions. Patient will have two teeth removed.   [place] calling with Dr. Buel Ream office. Thanks.

## 2016-09-27 NOTE — Telephone Encounter (Signed)
No need for SBE prophylaxis; decrease ASA to 81 mg daily Kirk Ruths

## 2016-09-27 NOTE — Telephone Encounter (Signed)
Will route to the physician for recommendation.

## 2016-09-27 NOTE — Telephone Encounter (Signed)
Left message for Judeen Hammans at Dr. Buel Ream office to call back

## 2016-09-27 NOTE — Telephone Encounter (Signed)
Instructions have been faxed to [facility]:  No need for SBE prophylaxis; decrease ASA to 81 mg daily Kirk Ruths

## 2016-09-28 NOTE — Telephone Encounter (Signed)
This note was faxed to the number provided 

## 2016-10-05 ENCOUNTER — Other Ambulatory Visit: Payer: BLUE CROSS/BLUE SHIELD

## 2016-10-05 DIAGNOSIS — E785 Hyperlipidemia, unspecified: Secondary | ICD-10-CM

## 2016-10-05 DIAGNOSIS — I10 Essential (primary) hypertension: Secondary | ICD-10-CM

## 2016-10-05 LAB — COMPREHENSIVE METABOLIC PANEL
ALK PHOS: 45 U/L (ref 40–115)
ALT: 18 U/L (ref 9–46)
AST: 21 U/L (ref 10–35)
Albumin: 3.9 g/dL (ref 3.6–5.1)
BILIRUBIN TOTAL: 1.5 mg/dL — AB (ref 0.2–1.2)
BUN: 13 mg/dL (ref 7–25)
CHLORIDE: 104 mmol/L (ref 98–110)
CO2: 27 mmol/L (ref 20–31)
CREATININE: 0.92 mg/dL (ref 0.70–1.18)
Calcium: 9.2 mg/dL (ref 8.6–10.3)
Glucose, Bld: 95 mg/dL (ref 65–99)
Potassium: 4.1 mmol/L (ref 3.5–5.3)
SODIUM: 138 mmol/L (ref 135–146)
TOTAL PROTEIN: 6.2 g/dL (ref 6.1–8.1)

## 2016-10-05 LAB — LIPID PANEL
CHOLESTEROL: 113 mg/dL (ref <200)
HDL: 58 mg/dL (ref >40)
LDL Cholesterol: 43 mg/dL (ref <100)
Total CHOL/HDL Ratio: 1.9 Ratio (ref <5.0)
Triglycerides: 58 mg/dL (ref <150)
VLDL: 12 mg/dL (ref <30)

## 2016-10-09 ENCOUNTER — Ambulatory Visit (INDEPENDENT_AMBULATORY_CARE_PROVIDER_SITE_OTHER): Payer: BLUE CROSS/BLUE SHIELD | Admitting: Internal Medicine

## 2016-10-09 ENCOUNTER — Encounter: Payer: Self-pay | Admitting: Internal Medicine

## 2016-10-09 VITALS — BP 120/70 | HR 55 | Temp 97.4°F | Ht 63.0 in | Wt 152.0 lb

## 2016-10-09 DIAGNOSIS — I10 Essential (primary) hypertension: Secondary | ICD-10-CM | POA: Diagnosis not present

## 2016-10-09 DIAGNOSIS — E785 Hyperlipidemia, unspecified: Secondary | ICD-10-CM

## 2016-10-09 DIAGNOSIS — K219 Gastro-esophageal reflux disease without esophagitis: Secondary | ICD-10-CM

## 2016-10-09 MED ORDER — ZOSTER VAC RECOMB ADJUVANTED 50 MCG/0.5ML IM SUSR: 0.5000 mL | Freq: Once | INTRAMUSCULAR | 1 refills | Status: AC

## 2016-10-09 NOTE — Progress Notes (Signed)
Facility  [facility]    Place of Service:   OFFICE    Allergies  Allergen Reactions  . Compazine [Prochlorperazine Edisylate] Anxiety    Chief Complaint  Patient presents with  . Medical Management of Chronic Issues    6 month follow-up, discuss labs (copy printed). Patient states sometimes it feels like a knot in stomach (mentioned to GI doctor also)   . Medication Refill    No refills needed today   . Health Maintenance    RX for Shingrix printed and given to patient     HPI:   Saw Dr. Fuller Plan 09/10/16. Returning for GERD, weight loss and epigastric abdominal pain. He states his abdominal pain and reflux symptoms are no longer bothering him. His appetite is good and his weight is stabilized. We reviewed findings from his CT scan as outlined below.   He was evaluated by Dr. Stanford Breed and underwent a myocardial perfusion study on April 24.   The left ventricular ejection fraction is mildly decreased (45-54%).   Nuclear stress EF: 48%.   Defect 1: There is a medium defect of severe severity present in the  basal inferolateral and mid inferolateral location.   Defect 2: There is a small defect of severe severity present in the  apical lateral location.   This is a low risk study. Low risk stress nuclear study with prior inferolateral infarct and mild  apical ischemia; EF 48 with apical hypokinesis and mild LVE.  Essential hypertension -- controlled  Gastroesophageal reflux disease, esophagitis presence not specified - controlled  Dyslipidemia - controlled    Medications: Patient's Medications  New Prescriptions   No medications on file  Previous Medications   ACETAMINOPHEN (TYLENOL) 500 MG TABLET    Take 1,000 mg by mouth every 6 (six) hours as needed for mild pain or moderate pain.   ALBUTEROL (PROVENTIL HFA;VENTOLIN HFA) 108 (90 BASE) MCG/ACT INHALER    Inhale 1-2 puffs into the lungs every 6 (six) hours as needed for wheezing or shortness of breath.   ALENDRONATE  (FOSAMAX) 70 MG TABLET    Take 1 tablet by mouth once a week.   ASPIRIN 325 MG EC TABLET    Take 325 mg by mouth daily.   ATENOLOL (TENORMIN) 50 MG TABLET    TAKE 1 TABLET BY MOUTH EVERY DAY TO CONTROL HEART RHYTHM AND LOWER BLOOD PRESSURE   AZELASTINE-FLUTICASONE (DYMISTA) 137-50 MCG/ACT SUSP    Place 2 sprays into both nostrils at bedtime.   B COMPLEX VITAMINS TABLET    Take 1 tablet by mouth daily.   CALCIUM CITRATE 250 MG TABS    Take 250 tablets by mouth daily.    CHLORPHENIRAMINE-HYDROCODONE (TUSSIONEX PENNKINETIC ER) 10-8 MG/5ML SUER    5 cc every 12 hours if needed for cough   CHOLECALCIFEROL (VITAMIN D) 1000 UNITS TABLET    Take 1,000 Units by mouth daily.     COENZYME Q10 (COQ10) 200 MG CAPS    Take by mouth daily.   DOXAZOSIN (CARDURA) 4 MG TABLET    ONE DAILY TO HELP CONTROL BP   FINASTERIDE (PROSCAR) 5 MG TABLET    Take 5 mg by mouth every morning.   FOLIC ACID (FOLVITE) 1 MG TABLET    Take 1 tablet (1 mg total) by mouth daily.   METOCLOPRAMIDE (REGLAN) 10 MG TABLET    Take 0.5 tablets (5 mg total) by mouth every 6 (six) hours as needed for nausea (nausea/headache).   MULTIPLE VITAMIN (MULTIVITAMIN) TABLET  Take 1 tablet by mouth daily.     OMEPRAZOLE (PRILOSEC) 20 MG CAPSULE    TAKE ONE CAPSULE BY MOUTH TO REDUCE STOMACH ACID   POTASSIUM 99 MG TABS    Take 99 mg by mouth daily.   PROBIOTIC PRODUCT (PROBIOTIC DAILY PO)    Take 2 capsules by mouth daily.    RANITIDINE (ZANTAC) 150 MG TABLET    Take 1 tablet (150 mg total) by mouth at bedtime.   ROSUVASTATIN (CRESTOR) 20 MG TABLET    TAKE ONE TABLET BY MOUTH ONCE DAILY FOR CHOLESTEROL  Modified Medications   Modified Medication Previous Medication   ZOSTER VAC RECOMB ADJUVANTED (SHINGRIX) INJECTION Zoster Vac Recomb Adjuvanted (SHINGRIX) injection      Inject 0.5 mLs into the muscle once.    Inject 0.5 mLs into the muscle once.  Discontinued Medications   No medications on file    Review of Systems  Constitutional: Negative.   Negative for activity change, appetite change, fatigue, fever and unexpected weight change.  HENT: Positive for voice change (laryngospasm (evaluated at [facility]). Negative for congestion, ear pain, hearing loss, rhinorrhea, sore throat, tinnitus and trouble swallowing.   Eyes: Negative.        Corrective lenses  Respiratory: Positive for cough. Negative for choking, chest tightness, shortness of breath and wheezing.        Wears CPAP. Followed by Dr. Annamaria Boots.  Cardiovascular: Negative for chest pain, palpitations and leg swelling.  Gastrointestinal: Positive for diarrhea. Negative for abdominal distention, abdominal pain, constipation and nausea.  Endocrine: Negative.  Negative for cold intolerance, heat intolerance, polydipsia, polyphagia and polyuria.  Genitourinary: Negative for dysuria, frequency, testicular pain and urgency.       Not incontinent  Musculoskeletal: Negative for arthralgias, back pain, gait problem, myalgias and neck pain.       History osteoporosis.  Skin: Negative.  Negative for color change, pallor and rash.  Allergic/Immunologic: Negative.   Neurological: Negative.  Negative for dizziness, tremors, syncope, speech difficulty, weakness, numbness and headaches.  Hematological: Negative.  Negative for adenopathy. Does not bruise/bleed easily.  Psychiatric/Behavioral: Negative.  Negative for behavioral problems, confusion, decreased concentration, hallucinations and sleep disturbance. The patient is not nervous/anxious.     Vitals:   10/09/16 1328  BP: 120/70  Pulse: (!) 55  Temp: 97.4 F (36.3 C)  TempSrc: Oral  SpO2: 97%  Weight: 152 lb (68.9 kg)  Height: 5' 3"  (1.6 m)   Body mass index is 26.93 kg/m. Wt Readings from Last 3 Encounters:  10/09/16 152 lb (68.9 kg)  09/10/16 149 lb (67.6 kg)  08/28/16 150 lb (68 kg)      Physical Exam  Constitutional: He is oriented to person, place, and time. He appears well-developed and well-nourished. No distress.    HENT:  Head: Atraumatic.  Right Ear: External ear normal.  Left Ear: External ear normal.  Nose: Nose normal.  Head congestion  Eyes: Conjunctivae and EOM are normal. Pupils are equal, round, and reactive to light.  Neck: Normal range of motion. Neck supple. No JVD present. No tracheal deviation present. No thyromegaly present.  Cardiovascular: Normal rate, regular rhythm, normal heart sounds and intact distal pulses.  Exam reveals no gallop and no friction rub.   No murmur heard. Pulmonary/Chest: Effort normal and breath sounds normal. No respiratory distress. He has no wheezes. He has no rales. He exhibits no tenderness.  Abdominal: Soft. Bowel sounds are normal. He exhibits no distension and no mass. There is no tenderness.  There is no rebound.  Musculoskeletal: Normal range of motion. He exhibits no edema or tenderness.  Lymphadenopathy:    He has no cervical adenopathy.  Neurological: He is alert and oriented to person, place, and time. He has normal reflexes. No cranial nerve deficit. Coordination normal.  Skin: Skin is warm and dry. No rash noted. No erythema. No pallor.  Psychiatric: He has a normal mood and affect. His behavior is normal. Judgment and thought content normal.    Labs reviewed: Lab Summary Latest Ref Rng & Units 10/05/2016 08/28/2016 08/23/2016  Hemoglobin 13.5 - 17.5 g/dL (None) 13.1(A) 12.5(L)  Hematocrit 41 - 53 % (None) 39(A) 37.2(L)  White count 10:3/mL (None) 5.9 6.4  Platelet count 150 - 399 K/L (None) 201 178  Sodium 135 - 146 mmol/L 138 143 135  Potassium 3.5 - 5.3 mmol/L 4.1 4.5 4.1  Calcium 8.6 - 10.3 mg/dL 9.2 (None) 8.7  Phosphorus - (None) (None) (None)  Creatinine 0.70 - 1.18 mg/dL 0.92 0.8 0.96  AST 10 - 35 U/L 21 18 18   Alk Phos 40 - 115 U/L 45 45 45  Bilirubin 0.2 - 1.2 mg/dL 1.5(H) (None) 1.2  Glucose 65 - 99 mg/dL 95 92 112(H)  Cholesterol <200 mg/dL 113 125 (None)  HDL cholesterol >40 mg/dL 58 59 (None)  Triglycerides <150 mg/dL 58 70  (None)  LDL Direct - (None) (None) (None)  LDL Calc <100 mg/dL 43 52 (None)  Total protein 6.1 - 8.1 g/dL 6.2 (None) (None)  Albumin 3.6 - 5.1 g/dL 3.9 (None) 4.0  Some recent data might be hidden   No results found for: TSH, T3TOTAL, T4TOTAL, THYROIDAB Lab Results  Component Value Date   BUN 13 10/05/2016   BUN 10 08/28/2016   BUN 14 08/23/2016   Lab Results  Component Value Date   HGBA1C 5.6 07/28/2012   HGBA1C  03/04/2009    6.1 (NOTE) The ADA recommends the following therapeutic goal for glycemic control related to Hgb A1c measurement: Goal of therapy: <6.5 Hgb A1c  Reference: American Diabetes Association: Clinical Practice Recommendations 2010, Diabetes Care, 2010, 33: (Suppl  1).   Dg Bone Density (dxa)  Result Date: 09/10/2016 EXAM: DUAL X-RAY ABSORPTIOMETRY (DXA) FOR BONE MINERAL DENSITY IMPRESSION: Referring Physician:  BASSETT, REBECCA S. PATIENT: Name: THORVALD, ORSINO Patient ID: [id number] Birth Date: 04-Nov-1935 Height: 63.5 in. Sex: Male Measured: 09/10/2016 Weight: 149.0 lbs. Indications: Advanced Age, Caucasian, Osteoporosis (44), Prilosec, zantac Fractures: None Treatments: Calcium (E943.0), Fosamax, Vitamin D (E933.5) ASSESSMENT: The BMD measured at AP Spine L1-L4 is 1.032 g/cm2 with a T-score of -1.3. This patient is considered osteopenic according to the [facility]) criteria. There has been a statistically significant decrease in BMD of Lumbar spine,and of Left hip since prior exam dated 08/05/2013. Site Region Measured Date Measured Age YA BMD Significant CHANGE T-score AP Spine  L1-L4     11/10[age]         -1.3    1.032 g/cm2 * DualFemur Neck Left 11/10/2016    [age]         -1[age]3    0.859 g/cm2 World Health Organization [facility]) criteria for post-menopausal, Caucasian Women: Normal       T-score at or above -1 SD Osteopenia   T-score between -1 and -2.5 SD Osteoporosis T-score at or below -2.5 SD RECOMMENDATION: [place]  recommends that FDA-approved medical therapies be considered in postmenopausal women and men age 53 or older with a: 1.  Hip or vertebral (clinical or morphometric) fracture. 2. T-score of <-2.5 at the spine or hip. 3. Ten-year fracture probability by FRAX of 3% or greater for hip fracture or 20% or greater for major osteoporotic fracture. All treatment decisions require clinical judgment and consideration of individual patient factors, including patient preferences, co-morbidities, previous drug use, risk factors not captured in the FRAX model (e.g. falls, vitamin D deficiency, increased bone turnover, interval significant decline in bone density) and possible under - or over-estimation of fracture risk by FRAX. All patients should ensure an adequate intake of dietary calcium (1200 mg/d) and vitamin D (800 IU daily) unless contraindicated. FOLLOW-UP: People with diagnosed cases of osteoporosis or osteopenia should be regularly tested for bone mineral density. For patients eligible for Medicare, routine testing is allowed once every 2 years. The testing frequency can be increased to one year for patients who have rapidly progressing disease, or for those who are receiving medical therapy to restore bone mass. I have reviewed this report, and agree with the above findings. [facility] Electronically Signed   By: Abelardo Diesel M.D.   On: 09/10/2016 16:23   08/13/16 CT abd and pelvis:  1. No acute findings in the abdomen or pelvis to account for the patient's symptoms. 2. Cholelithiasis, without evidence to suggest an acute cholecystitis at this time. 3. Aortic atherosclerosis, in addition to 2 vessel coronary artery disease. Assessment for potential risk factor modification, dietary therapy or pharmacologic therapy may be warranted, if clinically indicated. 4. Normal appendix. 5. Prostatomegaly with median lobe hypertrophy. 6. Additional incidental findings, as above.  Assessment/Plan  1.  Essential hypertension The current medical regimen is effective;  continue present plan and medications.  2. Gastroesophageal reflux disease, esophagitis presence not specified The current medical regimen is effective;  continue present plan and medications.  3. Dyslipidemia The current medical regimen is effective;  continue present plan and medications.

## 2016-10-19 ENCOUNTER — Telehealth: Payer: Self-pay | Admitting: Internal Medicine

## 2016-10-19 NOTE — Telephone Encounter (Signed)
LM x 1 

## 2016-10-22 MED ORDER — AZELASTINE-FLUTICASONE 137-50 MCG/ACT NA SUSP: 2.0000 | Freq: Every day | NASAL | 5 refills | Status: DC

## 2016-10-22 NOTE — Telephone Encounter (Signed)
I called and lmom for the pt to make him aware of the dymista that has been sent to the pharmacy.  Nothing further is needed

## 2016-10-22 NOTE — Telephone Encounter (Signed)
lmomtcb x 2 for the pt.  

## 2016-10-23 NOTE — Telephone Encounter (Signed)
Spoke with patient's wife Brent Griffith. She stated that Brent Griffith is requesting a letter stating that he is on a CPAP machine to be written to the [facility] so that his electricity will not be turned off. They would like for the letter to be mailed directly to them. Verified their address on the phone.    CY, is it ok for [place] to write this letter? Please advise. Thanks.

## 2016-10-23 NOTE — Telephone Encounter (Signed)
Yes thanks- just state that he is under our care and needs to use an electrically powered CPAP medical device to support his breathing whenever he sleeps.

## 2016-10-23 NOTE — Telephone Encounter (Signed)
Spoke with pt's wife and informed her that CY gave the ok for the letter. The letter was done and signed by CY and placed in outgoing mail. She had no further questions. Nothing further is needed

## 2016-12-17 ENCOUNTER — Other Ambulatory Visit: Payer: Self-pay | Admitting: Internal Medicine

## 2016-12-28 ENCOUNTER — Ambulatory Visit (HOSPITAL_BASED_OUTPATIENT_CLINIC_OR_DEPARTMENT_OTHER): Payer: BLUE CROSS/BLUE SHIELD | Admitting: Family

## 2016-12-28 ENCOUNTER — Other Ambulatory Visit (HOSPITAL_BASED_OUTPATIENT_CLINIC_OR_DEPARTMENT_OTHER): Payer: BLUE CROSS/BLUE SHIELD

## 2016-12-28 VITALS — BP 132/65 | HR 62 | Temp 98.3°F | Resp 18 | Wt 147.0 lb

## 2016-12-28 DIAGNOSIS — D561 Beta thalassemia: Secondary | ICD-10-CM

## 2016-12-28 DIAGNOSIS — D508 Other iron deficiency anemias: Secondary | ICD-10-CM

## 2016-12-28 DIAGNOSIS — D509 Iron deficiency anemia, unspecified: Secondary | ICD-10-CM

## 2016-12-28 DIAGNOSIS — D649 Anemia, unspecified: Secondary | ICD-10-CM

## 2016-12-28 LAB — CBC WITH DIFFERENTIAL (CANCER CENTER ONLY)
BASO#: 0 10*3/uL (ref 0.0–0.2)
BASO%: 0.3 % (ref 0.0–2.0)
EOS ABS: 0.1 10*3/uL (ref 0.0–0.5)
EOS%: 2 % (ref 0.0–7.0)
HCT: 36.9 % — ABNORMAL LOW (ref 38.7–49.9)
HGB: 12.9 g/dL — ABNORMAL LOW (ref 13.0–17.1)
LYMPH#: 1.6 10*3/uL (ref 0.9–3.3)
LYMPH%: 25 % (ref 14.0–48.0)
MCH: 30.9 pg (ref 28.0–33.4)
MCHC: 35 g/dL (ref 32.0–35.9)
MCV: 88 fL (ref 82–98)
MONO#: 0.5 10*3/uL (ref 0.1–0.9)
MONO%: 7.3 % (ref 0.0–13.0)
NEUT#: 4.2 10*3/uL (ref 1.5–6.5)
NEUT%: 65.4 % (ref 40.0–80.0)
Platelets: 164 10*3/uL (ref 145–400)
RBC: 4.18 10*6/uL — ABNORMAL LOW (ref 4.20–5.70)
RDW: 12.2 % (ref 11.1–15.7)
WBC: 6.4 10*3/uL (ref 4.0–10.0)

## 2016-12-28 NOTE — Progress Notes (Signed)
Hematology and Oncology Follow Up Visit  Brent Griffith [id number] 05/09/1935 81 y.o. 12/28/2016   Principle Diagnosis:  Low ferritin   Current Therapy:   IV iron as indicated - last received in April    Interim History:  Brent Griffith is here today with his wife for follow-up. He is doing well and has no complaints at this time. He received IV iron in April for a low ferritin of 18.  He is staying busy with work and enjoying the summer with his lovely wife.  He has had no episodes of bleeding, no bruising or petechiae. He denies fatigue.  No fever, chills, n/v, cough, rash, dizziness, SOB, chest pain, palpitations, abdominal pain or changes in bowel or bladder habits.  No lymphadenopathy found on exam. No swelling, tenderness, numbness or tingling in his extremities. No c/o pain.  He has a good appetite and is staying well hydrated. His weight is stable.   ECOG Performance Status: 0 - Asymptomatic  Medications:  Allergies as of 12/28/2016      Reactions   Compazine [prochlorperazine Edisylate] Anxiety      Medication List       Accurate as of 12/28/16  4:15 PM. Always use your most recent med list.          acetaminophen 500 MG tablet Commonly known as:  TYLENOL Take 1,000 mg by mouth every 6 (six) hours as needed for mild pain or moderate pain.   albuterol 108 (90 Base) MCG/ACT inhaler Commonly known as:  PROVENTIL HFA;VENTOLIN HFA Inhale 1-2 puffs into the lungs every 6 (six) hours as needed for wheezing or shortness of breath.   alendronate 70 MG tablet Commonly known as:  FOSAMAX Take 1 tablet by mouth once a week.   aspirin 325 MG EC tablet Take 325 mg by mouth daily.   atenolol 50 MG tablet Commonly known as:  TENORMIN TAKE 1 TABLET BY MOUTH EVERY DAY TO CONTROL HEART RHYTHM AND LOWER BLOOD PRESSURE   Azelastine-Fluticasone 137-50 MCG/ACT Susp Commonly known as:  DYMISTA Place 2 sprays into both nostrils at bedtime.   b complex vitamins tablet Take 1  tablet by mouth daily.   Calcium Citrate 250 MG Tabs Take 250 tablets by mouth daily.   chlorpheniramine-HYDROcodone 10-8 MG/5ML Suer Commonly known as:  TUSSIONEX PENNKINETIC ER 5 cc every 12 hours if needed for cough   cholecalciferol 1000 units tablet Commonly known as:  VITAMIN D Take 1,000 Units by mouth daily.   CoQ10 200 MG Caps Take by mouth daily.   doxazosin 4 MG tablet Commonly known as:  CARDURA ONE DAILY TO HELP CONTROL BP   finasteride 5 MG tablet Commonly known as:  PROSCAR Take 5 mg by mouth every morning.   folic acid 1 MG tablet Commonly known as:  FOLVITE Take 1 tablet (1 mg total) by mouth daily.   metoCLOPramide 10 MG tablet Commonly known as:  REGLAN Take 0.5 tablets (5 mg total) by mouth every 6 (six) hours as needed for nausea (nausea/headache).   multivitamin tablet Take 1 tablet by mouth daily.   omeprazole 20 MG capsule Commonly known as:  PRILOSEC TAKE ONE CAPSULE BY MOUTH TO REDUCE STOMACH ACID   Potassium 99 MG Tabs Take 99 mg by mouth daily.   PROBIOTIC DAILY PO Take 2 capsules by mouth daily.   ranitidine 150 MG tablet Commonly known as:  ZANTAC Take 1 tablet (150 mg total) by mouth at bedtime.   rosuvastatin 20 MG tablet Commonly  known as:  CRESTOR TAKE ONE TABLET BY MOUTH ONCE DAILY FOR CHOLESTEROL   Saw Palmetto 160 MG Caps Take by mouth.       Allergies:  Allergies  Allergen Reactions  . Compazine [Prochlorperazine Edisylate] Anxiety    Past Medical History, Surgical history, Social history, and Family History were reviewed and updated.  Review of Systems: All other 10 point review of systems is negative.   Physical Exam:  weight is 147 lb (66.7 kg). His oral temperature is 98.3 F (36.8 C). His blood pressure is 132/65 and his pulse is 62. His respiration is 18 and oxygen saturation is 99%.   Wt Readings from Last 3 Encounters:  12/28/16 147 lb (66.7 kg)  10/09/16 152 lb (68.9 kg)  09/10/16 149 lb (67.6  kg)    Ocular: Sclerae unicteric, pupils equal, round and reactive to light Ear-nose-throat: Oropharynx clear, dentition fair Lymphatic: No cervical, supraclavicular or axillary adenopathy Lungs no rales or rhonchi, good excursion bilaterally Heart regular rate and rhythm, no murmur appreciated Abd soft, nontender, positive bowel sounds, no liver or spleen tip palpated on exam, no fluid wave  MSK no focal spinal tenderness, no joint edema Neuro: non-focal, well-oriented, appropriate affect Breasts: Deferred   Lab Results  Component Value Date   WBC 6.4 12/28/2016   HGB 12.9 (L) 12/28/2016   HCT 36.9 (L) 12/28/2016   MCV 88 12/28/2016   PLT 164 12/28/2016   Lab Results  Component Value Date   FERRITIN 18 (L) 08/23/2016   IRON 96 08/23/2016   TIBC 388 08/23/2016   UIBC 292 08/23/2016   IRONPCTSAT 25 08/23/2016   Lab Results  Component Value Date   RBC 4.18 (L) 12/28/2016   No results found for: KPAFRELGTCHN, LAMBDASER, KAPLAMBRATIO No results found for: IGGSERUM, IGA, IGMSERUM No results found for: Odetta Pink, SPEI   Chemistry      Component Value Date/Time   NA 138 10/05/2016 0812   NA 143 08/28/2016   K 4.1 10/05/2016 0812   K 4.1 08/23/2016 1312   CL 104 10/05/2016 0812   CL 103 08/23/2016 1312   CO2 27 10/05/2016 0812   CO2 26 08/23/2016 1312   BUN 13 10/05/2016 0812   BUN 10 08/28/2016   CREATININE 0.92 10/05/2016 0812   GLU 92 08/28/2016      Component Value Date/Time   CALCIUM 9.2 10/05/2016 0812   CALCIUM 8.7 08/23/2016 1312   ALKPHOS 45 10/05/2016 0812   ALKPHOS 45 08/23/2016 1312   AST 21 10/05/2016 0812   AST 18 08/23/2016 1312   ALT 18 10/05/2016 0812   ALT 15 08/23/2016 1312   BILITOT 1.5 (H) 10/05/2016 0812   BILITOT 1.2 08/23/2016 1312      Impression and Plan: Brent Griffith is a very pleasant 81 yo gentleman with low ferritin. He is doing well and is asymptomatic at this time.  We  will see what his iron studies show and bring him back in next week for infusion if needed. His Hgb is stable at 12.9 with an MCV of 88.  He will continue to take his folic acid daily.  We will plan to see him back again in another 4 months for repeat lab work and follow-up.  He will contact our office with any questions or concerns. We can certainly see her sooner if need be.   Eliezer Bottom, NP 8/24/20184:15 PM

## 2016-12-29 LAB — RETICULOCYTES: RETICULOCYTE COUNT: 1.4 % (ref 0.6–2.6)

## 2016-12-31 LAB — FERRITIN: FERRITIN: 101 ng/mL (ref 22–316)

## 2016-12-31 LAB — IRON AND TIBC
%SAT: 40 % (ref 20–55)
IRON: 113 ug/dL (ref 42–163)
TIBC: 284 ug/dL (ref 202–409)
UIBC: 171 ug/dL (ref 117–376)

## 2017-01-23 ENCOUNTER — Other Ambulatory Visit: Payer: Self-pay | Admitting: Family

## 2017-01-23 DIAGNOSIS — D649 Anemia, unspecified: Secondary | ICD-10-CM

## 2017-01-23 DIAGNOSIS — D561 Beta thalassemia: Secondary | ICD-10-CM

## 2017-01-28 ENCOUNTER — Emergency Department (HOSPITAL_BASED_OUTPATIENT_CLINIC_OR_DEPARTMENT_OTHER)
Admission: EM | Admit: 2017-01-28 | Discharge: 2017-01-28 | Disposition: A | Discharge status: Home / Self Care | Payer: BLUE CROSS/BLUE SHIELD | Attending: Emergency Medicine | Admitting: Emergency Medicine

## 2017-01-28 ENCOUNTER — Encounter (HOSPITAL_BASED_OUTPATIENT_CLINIC_OR_DEPARTMENT_OTHER): Payer: Self-pay | Admitting: Emergency Medicine

## 2017-01-28 ENCOUNTER — Emergency Department (HOSPITAL_BASED_OUTPATIENT_CLINIC_OR_DEPARTMENT_OTHER): Payer: BLUE CROSS/BLUE SHIELD

## 2017-01-28 DIAGNOSIS — M549 Dorsalgia, unspecified: Secondary | ICD-10-CM | POA: Diagnosis present

## 2017-01-28 DIAGNOSIS — J45909 Unspecified asthma, uncomplicated: Secondary | ICD-10-CM | POA: Diagnosis not present

## 2017-01-28 DIAGNOSIS — Z79899 Other long term (current) drug therapy: Secondary | ICD-10-CM | POA: Insufficient documentation

## 2017-01-28 DIAGNOSIS — Z7982 Long term (current) use of aspirin: Secondary | ICD-10-CM | POA: Insufficient documentation

## 2017-01-28 DIAGNOSIS — I2510 Atherosclerotic heart disease of native coronary artery without angina pectoris: Secondary | ICD-10-CM | POA: Insufficient documentation

## 2017-01-28 DIAGNOSIS — I10 Essential (primary) hypertension: Secondary | ICD-10-CM | POA: Insufficient documentation

## 2017-01-28 LAB — CBC
HEMATOCRIT: 35.7 % — AB (ref 39.0–52.0)
HEMOGLOBIN: 12.4 g/dL — AB (ref 13.0–17.0)
MCH: 30.5 pg (ref 26.0–34.0)
MCHC: 34.7 g/dL (ref 30.0–36.0)
MCV: 87.9 fL (ref 78.0–100.0)
Platelets: 155 10*3/uL (ref 150–400)
RBC: 4.06 MIL/uL — ABNORMAL LOW (ref 4.22–5.81)
RDW: 12.4 % (ref 11.5–15.5)
WBC: 5.7 10*3/uL (ref 4.0–10.5)

## 2017-01-28 LAB — BASIC METABOLIC PANEL
Anion gap: 5 (ref 5–15)
BUN: 19 mg/dL (ref 6–20)
CALCIUM: 8.8 mg/dL — AB (ref 8.9–10.3)
CHLORIDE: 107 mmol/L (ref 101–111)
CO2: 25 mmol/L (ref 22–32)
CREATININE: 1 mg/dL (ref 0.61–1.24)
GFR calc non Af Amer: >60 mL/min (ref >60)
Glucose, Bld: 113 mg/dL — ABNORMAL HIGH (ref 65–99)
Potassium: 4.3 mmol/L (ref 3.5–5.1)
SODIUM: 137 mmol/L (ref 135–145)

## 2017-01-28 LAB — I-STAT TROPONIN, ED: Troponin i, poc: 0.01 ng/mL (ref 0.00–0.08)

## 2017-01-28 NOTE — ED Provider Notes (Signed)
[facility] Provider Note   CSN: [id number] Arrival date & time: 01/28/17  0719     History   Chief Complaint Chief Complaint  Patient presents with  . Back Pain    upper thoracic    HPI Brent Griffith is a 81 y.o. male.  HPI Patient is a 81 year old male presents emergency department left upper back pain with radiation towards his left lateral chest which began this morning.  He states he had transient shortness breath and nausea.  Denies vomiting.  He does have a prior history of cardiac disease.  He's had CABG.  He states this transiently felt like some of his angina but is since resolved.  He tried a Copy powder with resolution of his symptoms.  No shortness of breath this time.  No discomfort at this time.  Feels much better.  Compliant with his meds.   Past Medical History:  Diagnosis Date  . Acute gastric ulcer without mention of hemorrhage, perforation, or obstruction   . Allergic rhinitis, cause unspecified   . Anemia, unspecified   . Arthritis   . Asthma   . Blood transfusion without reported diagnosis   . CAD (coronary artery disease)   . Diaphragmatic hernia without mention of obstruction or gangrene   . Elevated prostate specific antigen (PSA)   . Enlarged prostate   . Esophageal reflux   . Extrinsic asthma, unspecified   . Herpes zoster without mention of complication   . Hyperlipidemia   . Hypersomnia with sleep apnea, unspecified   . Impotence of organic origin   . Kyphosis   . Lumbago   . Obstructive sleep apnea (adult) (pediatric)   . Pneumonia    hx of years ago   . Senile osteoporosis   . Trigger finger (acquired)   . Tubular adenoma of colon 05/2013  . Unspecified essential hypertension   . Unspecified sleep apnea    CPAP- settings 4-12   . Unspecified vitamin D deficiency     Patient Active Problem List   Diagnosis Date Noted  . IDA (iron deficiency anemia) 08/31/2016  . Seasonal and perennial allergic rhinitis 08/28/2016    . Palpitations 09/07/2015  . Cough, persistent 07/20/2015  . Senile osteoporosis 06/09/2014  . Shortness of breath 03/20/2014  . Hydronephrosis of right kidney 10/03/2013  . Benign hypertrophy of prostate 09/24/2013  . 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 Dyslipidemia 04/06/2009  . CARDIOVASCULAR FUNCTION STUDY, ABNORMAL 03/03/2009  . HYPERTENSION, PULMONARY 02/09/2009  . Obstructive sleep apnea 02/04/2009  . Essential hypertension 09/02/2008  . G E R D 09/02/2008  . 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 CARDIAC CATHETERIZATION  03/04/2009   Dr [name]  . COLONOSCOPY    . CORONARY ARTERY BYPASS GRAFT  2010  . CYSTOSCOPY WITH RETROGRADE PYELOGRAM, URETEROSCOPY AND STENT PLACEMENT Right 10/04/2013   Procedure: CYSTOSCOPY WITH RETROGRADE PYELOGRAM,  AND STENT PLACEMENT;  Surgeon: Festus Aloe, MD;  Location: [facility];  Service: Urology;  Laterality: Right;  . MOLE REMOVAL  1958   chin  . ROTATOR CUFF REPAIR Left 04/1998   with bone spur removed, Dr French Ana  . TONSILLECTOMY  1945  . TRANSURETHRAL RESECTION OF PROSTATE N/A 09/24/2013   Procedure: TRANSURETHRAL RESECTION OF THE PROSTATE (TURP) WITH GYRUS (STAGED RIGHT LATERAL AND MEDIAN LOBE);  Surgeon: Ailene Rud, MD;  Location: [facility];  Service: Urology;  Laterality: N/A;  . UPPER GI ENDOSCOPY  Home Medications    Prior to Admission medications   Medication Sig Start Date End Date Taking? Authorizing Provider  acetaminophen (TYLENOL) 500 MG tablet Take 1,000 mg by mouth every 6 (six) hours as needed for mild pain or moderate pain.    [provider]  albuterol (PROVENTIL HFA;VENTOLIN HFA) 108 (90 Base) MCG/ACT inhaler Inhale 1-2 puffs into the lungs every 6 (six) hours as needed for wheezing or shortness of breath.    [provider]  alendronate (FOSAMAX) 70 MG tablet Take 1 tablet by mouth once a week. 06/23/15   [provider]   aspirin 325 MG EC tablet Take 325 mg by mouth daily.    [provider]  atenolol (TENORMIN) 50 MG tablet TAKE 1 TABLET BY MOUTH EVERY DAY TO CONTROL HEART RHYTHM AND LOWER BLOOD PRESSURE 12/17/16   [name], NP  Azelastine-Fluticasone (DYMISTA) 137-50 MCG/ACT SUSP Place 2 sprays into both nostrils at bedtime. 10/22/16   Baird Lyons D, MD  b complex vitamins tablet Take 1 tablet by mouth daily.    [provider]  Calcium Citrate 250 MG TABS Take 250 tablets by mouth daily.     [provider]  chlorpheniramine-HYDROcodone Amanda Cockayne PENNKINETIC ER) 10-8 MG/5ML SUER 5 cc every 12 hours if needed for cough 03/20/16   Estill Dooms, MD  cholecalciferol (VITAMIN D) 1000 UNITS tablet Take 1,000 Units by mouth daily.      [provider]  Coenzyme Q10 (COQ10) 200 MG CAPS Take by mouth daily.    [provider]  doxazosin (CARDURA) 4 MG tablet ONE DAILY TO HELP CONTROL BP 02/01/16   Estill Dooms, MD  finasteride (PROSCAR) 5 MG tablet Take 5 mg by mouth every morning.    [provider]  folic acid (FOLVITE) 1 MG tablet TAKE 1 TABLET (1 MG TOTAL) BY MOUTH DAILY. 01/23/17   [place], Holli Humbles, NP  metoCLOPramide (REGLAN) 10 MG tablet Take 0.5 tablets (5 mg total) by mouth every 6 (six) hours as needed for nausea (nausea/headache). 07/17/16   Orlie Dakin, MD  Multiple Vitamin (MULTIVITAMIN) tablet Take 1 tablet by mouth daily.      [provider]  omeprazole (PRILOSEC) 20 MG capsule TAKE ONE CAPSULE BY MOUTH TO REDUCE STOMACH ACID 09/07/16   Estill Dooms, MD  Potassium 99 MG TABS Take 99 mg by mouth daily.    [provider]  Probiotic Product (PROBIOTIC DAILY PO) Take 2 capsules by mouth daily.     [provider]  ranitidine (ZANTAC) 150 MG tablet Take 1 tablet (150 mg total) by mouth at bedtime. 09/10/16   Ladene Artist, MD  rosuvastatin (CRESTOR) 20 MG tablet TAKE ONE TABLET BY MOUTH ONCE DAILY FOR  CHOLESTEROL 08/01/16   Estill Dooms, MD  Saw Palmetto 160 MG CAPS Take by mouth.    [provider]    Family History Family History  Problem Relation Age of Onset  . Stroke Mother   . Breast cancer Mother   . Cirrhosis Mother        wine  . Rheumatic fever Mother   . Kyphosis Mother   . Hypertension Father   . Stroke Father   . Heart disease Father   . Heart disease Paternal Grandfather   . Colon cancer Neg Hx     Social History Social History  Substance Use Topics  . Smoking status: Never Smoker  . Smokeless tobacco: Never Used  . Alcohol  use No     Allergies   Compazine [prochlorperazine edisylate]   Review of Systems Review of Systems  All other systems reviewed and are negative.    Physical Exam Updated Vital Signs BP 138/64   Pulse 65   Temp 97.8 F (36.6 C) (Oral)   Resp 12   Ht 5\' 4"  (1.626 m)   Wt 68 kg (150 lb)   SpO2 99%   BMI 25.75 kg/m   Physical Exam  Constitutional: He is oriented to person, place, and time. He appears well-developed and well-nourished.  HENT:  Head: Normocephalic.  Eyes: EOM are normal.  Neck: Normal range of motion.  Cardiovascular: Normal rate and regular rhythm.   Pulmonary/Chest: Effort normal and breath sounds normal. He exhibits no tenderness.  Abdominal: Soft. He exhibits no distension.  Musculoskeletal: Normal range of motion.  Neurological: He is alert and oriented to person, place, and time.  Psychiatric: He has a normal mood and affect.  Nursing note and vitals reviewed.    ED Treatments / Results  Labs (all labs ordered are listed, but only abnormal results are displayed) Labs Reviewed  BASIC METABOLIC PANEL - Abnormal; Notable for the following:       Result Value   Glucose, Bld 113 (*)    Calcium 8.8 (*)    All other components within normal limits  CBC - Abnormal; Notable for the following:    RBC 4.06 (*)    Hemoglobin 12.4 (*)    HCT 35.7 (*)    All other components within  normal limits  I-STAT TROPONIN, ED    EKG  EKG Interpretation  Date/Time:  Monday January 28 2017 07:36:44 EDT Ventricular Rate:  53 PR Interval:    QRS Duration: 118 QT Interval:  415 QTC Calculation: 390 R Axis:   -2 Text Interpretation:  Sinus rhythm Incomplete left bundle branch block No significant change was found Confirmed by Jola Schmidt [phone]) on 01/28/2017 7:43:34 AM       Radiology Dg Chest 2 View  Result Date: 01/28/2017 CLINICAL DATA:  Nausea and shortness of breath, nonsmoker. History of asthma, gastroesophageal reflux EXAM: CHEST  2 VIEW COMPARISON:  Chest x-ray of July 05, 2015 FINDINGS: The lungs are adequately inflated. There is no focal infiltrate. The heart and pulmonary vascularity are normal. The mediastinum is normal in width. There is calcification in the wall of the aortic arch. The patient has undergone previous CABG. There is mild multilevel degenerative disc disease of the upper thoracic spine. IMPRESSION: Chronic stable changes of reactive airway disease. Previous CABG. No pneumonia nor CHF. Thoracic aortic atherosclerosis. Electronically Signed   By: David  Martinique M.D.   On: 01/28/2017 07:56    Procedures Procedures (including critical care time)  Medications Ordered in ED Medications - No data to display   Initial Impression / Assessment and Plan / ED Course  I have reviewed the triage vital signs and the nursing notes.  Pertinent labs & imaging results that were available during my care of the patient were reviewed by me and considered in my medical decision making (see chart for details).   patient observed in the emergency department.  EKG without any new ischemic changes.  A symptomatically in the ER.  Troponin negative.  Chest x-ray normal.  Patient has scheduled follow-up with his cardiologist in the next week.  He understands return to the ER for new or worsening symptoms.  Final Clinical Impressions(s) / ED Diagnoses   Final  diagnoses:  Acute upper back pain    New Prescriptions New Prescriptions   No medications on file     Jola Schmidt, MD 01/28/17 [phone]

## 2017-01-28 NOTE — ED Triage Notes (Signed)
Pt having left sided rib pain since 0530 this am.   Some sob this am.  Some nausea.  Slight diaphoresis.  Pt states this pain reminds him of the angina when he had heart surgery.

## 2017-01-28 NOTE — Progress Notes (Signed)
HPI: FU coronary artery disease. Cardiac catheterization on March 04, 2009 revealed severe three-vessel coronary disease as well as an 80% left main. His ejection fraction was 50%. He ultimately underwent coronary artery bypass and graft on November 1 (left internal mammary artery to left anterior descending, sequential saphenous vein graft to ramus intermedius and obtuse marginal 2, saphenous vein graft to posterior descending. Echocardiogram November 2015 showed normal LV function, grade 1 diastolic dysfunction and moderate left atrial enlargement. Nuclear study April 2018 showed ejection fraction 48%. There was prior inferolateral infarct and mild apical ischemia. Patient treated medically. Patient seen in the emergency room September 2018 with back and left lateral chest pain. Troponin negative. Electrocardiogram unchanged. Hemoglobin 12.4. Since he was last seen, patient denies dyspnea on exertion, orthopnea, PND, pedal edema, palpitations, syncope or exertional chest pain.  Current Outpatient Prescriptions  Medication Sig Dispense Refill  . acetaminophen (TYLENOL) 500 MG tablet Take 1,000 mg by mouth every 6 (six) hours as needed for mild pain or moderate pain.    Marland Kitchen albuterol (PROVENTIL HFA;VENTOLIN HFA) 108 (90 Base) MCG/ACT inhaler Inhale 1-2 puffs into the lungs every 6 (six) hours as needed for wheezing or shortness of breath.    Marland Kitchen alendronate (FOSAMAX) 70 MG tablet Take 1 tablet by mouth once a week.  3  . aspirin 325 MG EC tablet Take 325 mg by mouth daily.    Marland Kitchen atenolol (TENORMIN) 50 MG tablet TAKE 1 TABLET BY MOUTH EVERY DAY TO CONTROL HEART RHYTHM AND LOWER BLOOD PRESSURE 90 tablet 3  . Azelastine-Fluticasone (DYMISTA) 137-50 MCG/ACT SUSP Place 2 sprays into both nostrils at bedtime. 1 Bottle 5  . b complex vitamins tablet Take 1 tablet by mouth daily.    . Calcium Citrate 250 MG TABS Take 250 tablets by mouth daily.     . chlorpheniramine-HYDROcodone (TUSSIONEX PENNKINETIC ER)  10-8 MG/5ML SUER 5 cc every 12 hours if needed for cough 140 mL 0  . cholecalciferol (VITAMIN D) 1000 UNITS tablet Take 1,000 Units by mouth daily.      . Coenzyme Q10 (COQ10) 200 MG CAPS Take by mouth daily.    Marland Kitchen doxazosin (CARDURA) 4 MG tablet ONE DAILY TO HELP CONTROL BP 90 tablet 4  . finasteride (PROSCAR) 5 MG tablet Take 5 mg by mouth every morning.    . folic acid (FOLVITE) 1 MG tablet TAKE 1 TABLET (1 MG TOTAL) BY MOUTH DAILY. 30 tablet 4  . metoCLOPramide (REGLAN) 10 MG tablet Take 0.5 tablets (5 mg total) by mouth every 6 (six) hours as needed for nausea (nausea/headache). 6 tablet 0  . Multiple Vitamin (MULTIVITAMIN) tablet Take 1 tablet by mouth daily.      Marland Kitchen omeprazole (PRILOSEC) 20 MG capsule TAKE ONE CAPSULE BY MOUTH TO REDUCE STOMACH ACID 90 capsule 1  . Potassium 99 MG TABS Take 99 mg by mouth daily.    . Probiotic Product (PROBIOTIC DAILY PO) Take 2 capsules by mouth daily.     . ranitidine (ZANTAC) 150 MG tablet Take 1 tablet (150 mg total) by mouth at bedtime. 90 tablet 3  . rosuvastatin (CRESTOR) 20 MG tablet TAKE ONE TABLET BY MOUTH ONCE DAILY FOR CHOLESTEROL 90 tablet 1  . Saw Palmetto 160 MG CAPS Take by mouth.     No current facility-administered medications for this visit.      Past Medical History:  Diagnosis Date  . Acute gastric ulcer without mention of hemorrhage, perforation, or obstruction   .  Allergic rhinitis, cause unspecified   . Anemia, unspecified   . Arthritis   . Asthma   . Blood transfusion without reported diagnosis   . CAD (coronary artery disease)   . Diaphragmatic hernia without mention of obstruction or gangrene   . Elevated prostate specific antigen (PSA)   . Enlarged prostate   . Esophageal reflux   . Extrinsic asthma, unspecified   . Herpes zoster without mention of complication   . Hyperlipidemia   . Hypersomnia with sleep apnea, unspecified   . Impotence of organic origin   . Kyphosis   . Lumbago   . Obstructive sleep apnea  (adult) (pediatric)   . Pneumonia    hx of years ago   . Senile osteoporosis   . Trigger finger (acquired)   . Tubular adenoma of colon 05/2013  . Unspecified essential hypertension   . Unspecified sleep apnea    CPAP- settings 4-12   . Unspecified vitamin D deficiency     Past Surgical History:  Procedure Laterality Date  . CARDIAC CATHETERIZATION  03/04/2009   Dr [name]  . COLONOSCOPY    . CORONARY ARTERY BYPASS GRAFT  2010  . CYSTOSCOPY WITH RETROGRADE PYELOGRAM, URETEROSCOPY AND STENT PLACEMENT Right 10/04/2013   Procedure: CYSTOSCOPY WITH RETROGRADE PYELOGRAM,  AND STENT PLACEMENT;  Surgeon: Festus Aloe, MD;  Location: [facility];  Service: Urology;  Laterality: Right;  . MOLE REMOVAL  1958   chin  . ROTATOR CUFF REPAIR Left 04/1998   with bone spur removed, Dr French Ana  . TONSILLECTOMY  1945  . TRANSURETHRAL RESECTION OF PROSTATE N/A 09/24/2013   Procedure: TRANSURETHRAL RESECTION OF THE PROSTATE (TURP) WITH GYRUS (STAGED RIGHT LATERAL AND MEDIAN LOBE);  Surgeon: Ailene Rud, MD;  Location: [facility];  Service: Urology;  Laterality: N/A;  . UPPER GI ENDOSCOPY      Social History   Social History  . Marital status: Married    Spouse name: N/A  . Number of children: 0  . Years of education: N/A   Occupational History  . [employer]    Social History Main Topics  . Smoking status: Never Smoker  . Smokeless tobacco: Never Used  . Alcohol use No  . Drug use: No  . Sexual activity: Not on file   Other Topics Concern  . Not on file   Social History Narrative  . No narrative on file    Family History  Problem Relation Age of Onset  . Stroke Mother   . Breast cancer Mother   . Cirrhosis Mother        wine  . Rheumatic fever Mother   . Kyphosis Mother   . Hypertension Father   . Stroke Father   . Heart disease Father   . Heart disease Paternal Grandfather   . Colon cancer Neg Hx     ROS: no fevers or chills, productive cough,  hemoptysis, dysphasia, odynophagia, melena, hematochezia, dysuria, hematuria, rash, seizure activity, orthopnea, PND, pedal edema, claudication. Remaining systems are negative.  Physical Exam: Well-developed well-nourished in no acute distress.  Skin is warm and dry.  HEENT is normal.  Neck is supple.  Chest is clear to auscultation with normal expansion.  Cardiovascular exam is regular rate and rhythm.  Abdominal exam nontender or distended. No masses palpated. Extremities show no edema. neuro grossly intact  ECG- 01/28/2017-sinus bradycardia with inferior lateral T-wave inversion unchanged compared to previous. personally reviewed  A/P  1 coronary artery disease-patient had a recent episode  of back pain. I personally reviewed his records from the emergency room. Electrocardiogram was unchanged. Troponin negative. He does not have exertional chest pain and previous nuclear study was not high risk. I think continued medical therapy is indicated. Continue aspirin, statin and beta blocker.  2 hypertension-blood pressure is controlled. Continue present medications.  3 hyperlipidemia-continue statin. Laboratories from June 2018 personally reviewed and showed total cholesterol 113 with LDL 43. Liver functions normal.   4 chest/back pain-plan as outlined under #1.  Kirk Ruths, MD

## 2017-02-02 ENCOUNTER — Other Ambulatory Visit: Payer: Self-pay | Admitting: Internal Medicine

## 2017-02-06 ENCOUNTER — Encounter: Payer: Self-pay | Admitting: Cardiology

## 2017-02-06 ENCOUNTER — Ambulatory Visit (INDEPENDENT_AMBULATORY_CARE_PROVIDER_SITE_OTHER): Payer: BLUE CROSS/BLUE SHIELD | Admitting: Cardiology

## 2017-02-06 VITALS — BP 120/60 | HR 62 | Ht 64.0 in | Wt 145.8 lb

## 2017-02-06 DIAGNOSIS — R0789 Other chest pain: Secondary | ICD-10-CM

## 2017-02-06 DIAGNOSIS — I2510 Atherosclerotic heart disease of native coronary artery without angina pectoris: Secondary | ICD-10-CM

## 2017-02-06 DIAGNOSIS — I10 Essential (primary) hypertension: Secondary | ICD-10-CM | POA: Diagnosis not present

## 2017-02-06 DIAGNOSIS — E7800 Pure hypercholesterolemia, unspecified: Secondary | ICD-10-CM

## 2017-02-06 NOTE — Patient Instructions (Signed)
Your physician wants you to follow-up in: 6 MONTHS WITH DR CRENSHAW You will receive a reminder letter in the mail two months in advance. If you don't receive a letter, please call our office to schedule the follow-up appointment.   If you need a refill on your cardiac medications before your next appointment, please call your pharmacy.  

## 2017-03-10 ENCOUNTER — Other Ambulatory Visit: Payer: Self-pay | Admitting: Internal Medicine

## 2017-03-10 DIAGNOSIS — K219 Gastro-esophageal reflux disease without esophagitis: Secondary | ICD-10-CM

## 2017-03-25 ENCOUNTER — Telehealth: Payer: Self-pay | Admitting: Internal Medicine

## 2017-03-25 NOTE — Telephone Encounter (Signed)
Form done 

## 2017-03-25 NOTE — Telephone Encounter (Signed)
Form has been filled out to best of my ability and placed on CY's desk for completion.  Will route message to [facility] for follow-up

## 2017-03-26 NOTE — Telephone Encounter (Signed)
Brent Griffith, was this form returned to you?

## 2017-03-26 NOTE — Telephone Encounter (Signed)
From was placed in to be faxed section at my desk by [facility]- this has been faxed back to [phone] of [facility]). Thanks.

## 2017-03-26 NOTE — Telephone Encounter (Signed)
Left a detailed message, form was faxed to the [facility]. Nothing further is needed.

## 2017-04-10 ENCOUNTER — Encounter: Payer: Self-pay | Admitting: Nurse Practitioner

## 2017-04-10 ENCOUNTER — Ambulatory Visit (INDEPENDENT_AMBULATORY_CARE_PROVIDER_SITE_OTHER): Payer: BLUE CROSS/BLUE SHIELD | Admitting: Nurse Practitioner

## 2017-04-10 VITALS — BP 118/72 | HR 68 | Temp 98.3°F | Resp 17 | Ht 63.0 in | Wt 148.0 lb

## 2017-04-10 DIAGNOSIS — D508 Other iron deficiency anemias: Secondary | ICD-10-CM

## 2017-04-10 DIAGNOSIS — I10 Essential (primary) hypertension: Secondary | ICD-10-CM

## 2017-04-10 DIAGNOSIS — G4733 Obstructive sleep apnea (adult) (pediatric): Secondary | ICD-10-CM | POA: Diagnosis not present

## 2017-04-10 DIAGNOSIS — R05 Cough: Secondary | ICD-10-CM

## 2017-04-10 DIAGNOSIS — K219 Gastro-esophageal reflux disease without esophagitis: Secondary | ICD-10-CM

## 2017-04-10 DIAGNOSIS — E785 Hyperlipidemia, unspecified: Secondary | ICD-10-CM | POA: Diagnosis not present

## 2017-04-10 DIAGNOSIS — Z9109 Other allergy status, other than to drugs and biological substances: Secondary | ICD-10-CM | POA: Diagnosis not present

## 2017-04-10 DIAGNOSIS — N401 Enlarged prostate with lower urinary tract symptoms: Secondary | ICD-10-CM

## 2017-04-10 DIAGNOSIS — R053 Chronic cough: Secondary | ICD-10-CM

## 2017-04-10 LAB — COMPLETE METABOLIC PANEL WITH GFR
AG Ratio: 1.8 (calc) (ref 1.0–2.5)
ALBUMIN MSPROF: 3.9 g/dL (ref 3.6–5.1)
ALT: 18 U/L (ref 9–46)
AST: 22 U/L (ref 10–35)
Alkaline phosphatase (APISO): 39 U/L — ABNORMAL LOW (ref 40–115)
BUN: 16 mg/dL (ref 7–25)
CO2: 29 mmol/L (ref 20–32)
Calcium: 9.1 mg/dL (ref 8.6–10.3)
Chloride: 105 mmol/L (ref 98–110)
Creat: 0.92 mg/dL (ref 0.70–1.18)
GFR, EST AFRICAN AMERICAN: 91 mL/min/{1.73_m2} (ref >=60)
GFR, Est Non African American: 79 mL/min/{1.73_m2} (ref >=60)
GLOBULIN: 2.2 g/dL (ref 1.9–3.7)
GLUCOSE: 127 mg/dL (ref 65–139)
POTASSIUM: 4.2 mmol/L (ref 3.5–5.3)
SODIUM: 139 mmol/L (ref 135–146)
TOTAL PROTEIN: 6.1 g/dL (ref 6.1–8.1)
Total Bilirubin: 1.5 mg/dL — ABNORMAL HIGH (ref 0.2–1.2)

## 2017-04-10 LAB — CBC WITH DIFFERENTIAL/PLATELET
BASOS ABS: 18 {cells}/uL (ref 0–200)
BASOS PCT: 0.3 %
EOS ABS: 89 {cells}/uL (ref 15–500)
EOS PCT: 1.5 %
HEMATOCRIT: 37.1 % — AB (ref 38.5–50.0)
HEMOGLOBIN: 13 g/dL — AB (ref 13.2–17.1)
LYMPHS ABS: 1345 {cells}/uL (ref 850–3900)
MCH: 31.8 pg (ref 27.0–33.0)
MCHC: 35 g/dL (ref 32.0–36.0)
MCV: 90.7 fL (ref 80.0–100.0)
MONOS PCT: 7.1 %
MPV: 11 fL (ref 7.5–12.5)
NEUTROS ABS: 4030 {cells}/uL (ref 1500–7800)
Neutrophils Relative %: 68.3 %
Platelets: 188 10*3/uL (ref 140–400)
RBC: 4.09 10*6/uL — ABNORMAL LOW (ref 4.20–5.80)
RDW: 11.5 % (ref 11.0–15.0)
Total Lymphocyte: 22.8 %
WBC mixed population: 419 cells/uL (ref 200–950)
WBC: 5.9 10*3/uL (ref 3.8–10.8)

## 2017-04-10 MED ORDER — ALBUTEROL SULFATE HFA 108 (90 BASE) MCG/ACT IN AERS: 1.0000 | INHALATION_SPRAY | Freq: Four times a day (QID) | RESPIRATORY_TRACT | 0 refills | Status: DC | PRN

## 2017-04-10 MED ORDER — PANTOPRAZOLE SODIUM 40 MG PO TBEC: 40.0000 mg | DELAYED_RELEASE_TABLET | Freq: Every day | ORAL | 3 refills | Status: DC

## 2017-04-10 NOTE — Patient Instructions (Addendum)
Stop OMEPRAZOLE  Start Protonix 40 mg daily for acid reflux- lets see if this does not help with cough and acid symptoms.  Cont ranitidine   If cough symptoms cont to persist lets get you into pulmonary to have them further evaluate this.   Start taking zyrtec(cetirizine) 10 mg by mouth daily

## 2017-04-10 NOTE — Progress Notes (Signed)
Careteam: Patient Care Team: [name], NP as PCP - General (Geriatric Medicine)  Advanced Directive information Does Patient Have a Medical Advance Directive?: No, Would patient like information on creating a medical advance directive?: No - Patient declined  Allergies  Allergen Reactions  . Compazine [Prochlorperazine Edisylate] Anxiety    Chief Complaint  Patient presents with  . Medical Management of Chronic Issues    Pt is being seen for a 6 month routine visit. Pt wants to discuss refill on Tussenix (refill pended)     HPI: Patient is a 81 y.o. male seen in the office today for routine follow up  Pt with hx of GERD with epigastric abdominal pain and weight loss, CHF, HTN, hyperlipidemia, male OP and others.   Using CPAP  HTN-maintained on atenolol    Osteoporosis- on fosamax and vit D last dexa  in 09/2016  BPH- taking cardura and proscar, follows with urologist Dr Gaynelle Arabian but he has retired so will see someone new in January.   GERD- currently on omeprazole, zantac- feel like he is doing well with the combination uses Reglan as needed nausea but does not need often.   Hyperlipidemia- taking crestor 20 mg daily   Following with cardiologist for CAD, hyperlipidemia and htn.   Has been having back pain but it is due to lack of exercising, better when he is doing this.   Uses albuterol as needed to help him when he has been coughing and due to voice abnormality.  Has a cough reports he as tired a few types and they have not helped. Has also tried tessalon pearls and this does not help. Using tussinex for this at night to help him sleep.   Iron def anemia- seeing hematologist with hx of iron transfusion, now on supplement.   Review of Systems:  Review of Systems  Constitutional: Negative for chills, fever and weight loss.  HENT: Negative for tinnitus.   Respiratory: Positive for cough. Negative for sputum production and shortness of breath.     Cardiovascular: Negative for chest pain, palpitations and leg swelling.  Gastrointestinal: Positive for heartburn (controlled on current regimen). Negative for abdominal pain, constipation and diarrhea.  Genitourinary: Negative for dysuria, frequency and urgency.  Musculoskeletal: Positive for back pain. Negative for falls, joint pain and myalgias.  Skin: Negative.   Neurological: Negative for dizziness and headaches.  Psychiatric/Behavioral: Negative for depression and memory loss. The patient does not have insomnia.     Past Medical History:  Diagnosis Date  . Acute gastric ulcer without mention of hemorrhage, perforation, or obstruction   . Allergic rhinitis, cause unspecified   . Anemia, unspecified   . Arthritis   . Asthma   . Blood transfusion without reported diagnosis   . CAD (coronary artery disease)   . Diaphragmatic hernia without mention of obstruction or gangrene   . Elevated prostate specific antigen (PSA)   . Enlarged prostate   . Esophageal reflux   . Extrinsic asthma, unspecified   . Herpes zoster without mention of complication   . Hyperlipidemia   . Hypersomnia with sleep apnea, unspecified   . Impotence of organic origin   . Kyphosis   . Lumbago   . Obstructive sleep apnea (adult) (pediatric)   . Pneumonia    hx of years ago   . Senile osteoporosis   . Trigger finger (acquired)   . Tubular adenoma of colon 05/2013  . Unspecified essential hypertension   . Unspecified sleep apnea  CPAP- settings 4-12   . Unspecified vitamin D deficiency    Past Surgical History:  Procedure Laterality Date  . CARDIAC CATHETERIZATION  03/04/2009   Dr [name]  . COLONOSCOPY    . CORONARY ARTERY BYPASS GRAFT  2010  . CYSTOSCOPY WITH RETROGRADE PYELOGRAM, URETEROSCOPY AND STENT PLACEMENT Right 10/04/2013   Procedure: CYSTOSCOPY WITH RETROGRADE PYELOGRAM,  AND STENT PLACEMENT;  Surgeon: Festus Aloe, MD;  Location: [facility];  Service: Urology;  Laterality: Right;   . MOLE REMOVAL  1958   chin  . ROTATOR CUFF REPAIR Left 04/1998   with bone spur removed, Dr French Ana  . TONSILLECTOMY  1945  . TRANSURETHRAL RESECTION OF PROSTATE N/A 09/24/2013   Procedure: TRANSURETHRAL RESECTION OF THE PROSTATE (TURP) WITH GYRUS (STAGED RIGHT LATERAL AND MEDIAN LOBE);  Surgeon: Ailene Rud, MD;  Location: [facility];  Service: Urology;  Laterality: N/A;  . UPPER GI ENDOSCOPY     Social History:   reports that  has never smoked. he has never used smokeless tobacco. He reports that he does not drink alcohol or use drugs.  Family History  Problem Relation Age of Onset  . Stroke Mother   . Breast cancer Mother   . Cirrhosis Mother        wine  . Rheumatic fever Mother   . Kyphosis Mother   . Hypertension Father   . Stroke Father   . Heart disease Father   . Heart disease Paternal Grandfather   . Colon cancer Neg Hx     Medications:   Medication List        Accurate as of 04/10/17  2:39 PM. Always use your most recent med list.          acetaminophen 500 MG tablet Commonly known as:  TYLENOL   albuterol 108 (90 Base) MCG/ACT inhaler Commonly known as:  PROVENTIL HFA;VENTOLIN HFA   alendronate 70 MG tablet Commonly known as:  FOSAMAX   aspirin 325 MG EC tablet   atenolol 50 MG tablet Commonly known as:  TENORMIN TAKE 1 TABLET BY MOUTH EVERY DAY TO CONTROL HEART RHYTHM AND LOWER BLOOD PRESSURE   Azelastine-Fluticasone 137-50 MCG/ACT Susp Commonly known as:  DYMISTA Place 2 sprays into both nostrils at bedtime.   b complex vitamins tablet   Calcium Citrate 250 MG Tabs   chlorpheniramine-HYDROcodone 10-8 MG/5ML Suer Commonly known as:  TUSSIONEX PENNKINETIC ER 5 cc every 12 hours if needed for cough   cholecalciferol 1000 units tablet Commonly known as:  VITAMIN D   CoQ10 200 MG Caps   doxazosin 4 MG tablet Commonly known as:  CARDURA ONE DAILY TO HELP CONTROL BP   finasteride 5 MG tablet Commonly known as:  PROSCAR   folic  acid 1 MG tablet Commonly known as:  FOLVITE TAKE 1 TABLET (1 MG TOTAL) BY MOUTH DAILY.   metoCLOPramide 10 MG tablet Commonly known as:  REGLAN Take 0.5 tablets (5 mg total) by mouth every 6 (six) hours as needed for nausea (nausea/headache).   multivitamin tablet   omeprazole 20 MG capsule Commonly known as:  PRILOSEC TAKE ONE CAPSULE BY MOUTH TO REDUCE STOMACH ACID   Potassium 99 MG Tabs   PROBIOTIC DAILY PO   ranitidine 150 MG tablet Commonly known as:  ZANTAC Take 1 tablet (150 mg total) by mouth at bedtime.   rosuvastatin 20 MG tablet Commonly known as:  CRESTOR TAKE ONE TABLET BY MOUTH ONCE DAILY FOR CHOLESTEROL   Saw Palmetto 160 MG Caps  Physical Exam:  Vitals:   04/10/17 1420  BP: 118/72  Pulse: 68  Resp: 17  Temp: 98.3 F (36.8 C)  TempSrc: Oral  SpO2: 97%  Weight: 148 lb (67.1 kg)  Height: 5' 3"  (1.6 m)   Body mass index is 26.22 kg/m.  Physical Exam  Constitutional: He is oriented to person, place, and time. He appears well-developed and well-nourished. No distress.  HENT:  Head: Normocephalic and atraumatic.  Nose: Nose normal.  Eyes: Conjunctivae and EOM are normal. Pupils are equal, round, and reactive to light.  Neck: Normal range of motion. Neck supple.  Cardiovascular: Normal rate, regular rhythm and normal heart sounds.  Pulmonary/Chest: Effort normal and breath sounds normal. No respiratory distress.  Abdominal: Soft. Bowel sounds are normal.  Musculoskeletal: Normal range of motion. He exhibits no edema or tenderness.  Neurological: He is alert and oriented to person, place, and time. He has normal reflexes. No cranial nerve deficit. Coordination normal.  Skin: Skin is warm and dry. No rash noted. No erythema. No pallor.  Psychiatric: He has a normal mood and affect. His behavior is normal. Judgment and thought content normal.    Labs reviewed: Basic Metabolic Panel: Recent Labs    08/23/16 1312 08/28/16 10/05/16 0812  01/28/17 0738  NA 135 143 138 137  K 4.1 4.5 4.1 4.3  CL 103  --  104 107  CO2 26  --  27 25  GLUCOSE 112*  --  95 113*  BUN 14 10 13 19   CREATININE 0.96 0.8 0.92 1.00  CALCIUM 8.7  --  9.2 8.8*   Liver Function Tests: Recent Labs    07/17/16 1212 08/23/16 1312 08/28/16 10/05/16 0812  AST 21 18 18 21   ALT 14* 15 12 18   ALKPHOS 38 45 45 45  BILITOT 1.9* 1.2  --  1.5*  PROT 6.5 6.6  --  6.2  ALBUMIN 3.8 4.0  --  3.9   Recent Labs    07/17/16 1212  LIPASE 27   No results for input(s): AMMONIA in the last 8760 hours. CBC: Recent Labs    07/17/16 1212  08/23/16 1312 08/28/16 12/28/16 1508 01/28/17 0738  WBC 4.9   < > 6.4 5.9 6.4 5.7  NEUTROABS 3.4  --  4.5  --  4.2  --   HGB 11.9*  --  12.5* 13.1* 12.9* 12.4*  HCT 34.7*  --  37.2* 39* 36.9* 35.7*  MCV 86.3  --  89  --  88 87.9  PLT 152  --  178 201 164 155   < > = values in this interval not displayed.   Lipid Panel: Recent Labs    08/28/16 10/05/16 0812  CHOL 125 113  HDL 59 58  LDLCALC 52 43  TRIG 70 58  CHOLHDL  --  1.9   TSH: No results for input(s): TSH in the last 8760 hours. A1C: Lab Results  Component Value Date   HGBA1C 5.6 07/28/2012     Assessment/Plan 1. Cough, persistent -ongoing cough, suspect GERD related due to still having symptoms  -plan to change omeprazole to protonix. If this does not offer benefit will send back to pulmonary for further evaluation.  - albuterol (PROVENTIL HFA;VENTOLIN HFA) 108 (90 Base) MCG/ACT inhaler; Inhale 1-2 puffs into the lungs every 6 (six) hours as needed for wheezing or shortness of breath.  Dispense: 6.7 g; Refill: 0  2. Essential hypertension Controlled on current regimen, to cont diet and atenolol.   3.  Gastroesophageal reflux disease, esophagitis presence not specified -ongoing symptoms. conts Ranitidine but change omeprazole to protonix.  - pantoprazole (PROTONIX) 40 MG tablet; Take 1 tablet (40 mg total) by mouth daily.  Dispense: 30 tablet;  Refill: 3  4. Dyslipidemia LDL at goal in June on Crestor - CMP with eGFR  5. Obstructive sleep apnea -conts on CPAP nightly   6. Iron deficiency anemia secondary to inadequate dietary iron intake -conts on supplement. Hx of IV iron infusion.  - CBC with Differential/Platelets  7. Benign prostatic hyperplasia with lower urinary tract symptoms, symptom details unspecified S/p TURP but reports worsening of symptoms, has follow up with urologist scheduled next month. conts on cardura and proscar   8. Allergy to environmental factors Start taking zyrtec(cetirizine) 10 mg by mouth daily   Next appt: 6 months, lab work prior to [employer]. Harle Battiest  [facility] [phone] 8 am - 5 pm) [phone] (after hours)

## 2017-04-21 ENCOUNTER — Other Ambulatory Visit: Payer: Self-pay | Admitting: Internal Medicine

## 2017-04-21 DIAGNOSIS — I10 Essential (primary) hypertension: Secondary | ICD-10-CM

## 2017-05-08 ENCOUNTER — Other Ambulatory Visit: Payer: Self-pay | Admitting: Nurse Practitioner

## 2017-05-08 DIAGNOSIS — R05 Cough: Secondary | ICD-10-CM

## 2017-05-08 DIAGNOSIS — R053 Chronic cough: Secondary | ICD-10-CM

## 2017-05-08 NOTE — Telephone Encounter (Signed)
It was actually a refill but if he is still having symptoms he will need referral to pulmonary office. Thank you for checking into this.

## 2017-05-14 ENCOUNTER — Telehealth: Payer: Self-pay | Admitting: Internal Medicine

## 2017-05-14 MED ORDER — PREDNISONE 10 MG PO TABS: ORAL_TABLET | ORAL | 0 refills | Status: DC

## 2017-05-14 MED ORDER — DOXYCYCLINE HYCLATE 100 MG PO TABS: 100.0000 mg | ORAL_TABLET | Freq: Two times a day (BID) | ORAL | 0 refills | Status: DC

## 2017-05-14 NOTE — Telephone Encounter (Signed)
Spoke with pt, he states he is coughing without mucus and has laryngitis for the last three days. He states he has no fever and did not try any otc medications for symptoms. Denies body aches, sore throat, but feels some congestion causing SOB. Please advise.   [facility] [employer]

## 2017-05-14 NOTE — Telephone Encounter (Signed)
Reviewed symptoms and chart. Unable to understand basis if viral v bacterial for symptoms. Options are  A)  just wait and watch.   B) Take doxycycline 100mg  po twice daily x 5 days; take after meals and avoid sunlight  Or C)  Take doxycycline 100mg  po twice daily x 5 days; take after meals and avoid sunlight Please take prednisone 40 mg x1 day, then 30 mg x1 day, then 20 mg x1 day, then 10 mg x1 day, and then 5 mg x1 day and stop  He can choose any 1 he likes  If worse, make acute visit or go to ER  Dr. Brand Males, M.D., [facility].C.P Pulmonary and Critical Care Medicine Staff Physician, [place] Director - Interstitial Lung Disease  Program  Pulmonary [place] at [place], [place], [address]  Pager: [phone], If no answer or between  15:00h - 7:00h: call [phone] Telephone: [phone]

## 2017-05-14 NOTE — Telephone Encounter (Signed)
Spoke with pt, he chose option C. I called in the rxs to his pharmacy, nothing further is needed.

## 2017-05-30 ENCOUNTER — Other Ambulatory Visit: Payer: Self-pay | Admitting: *Deleted

## 2017-05-30 DIAGNOSIS — K219 Gastro-esophageal reflux disease without esophagitis: Secondary | ICD-10-CM

## 2017-05-30 MED ORDER — PANTOPRAZOLE SODIUM 40 MG PO TBEC: 40.0000 mg | DELAYED_RELEASE_TABLET | Freq: Every day | ORAL | 3 refills | Status: DC

## 2017-05-30 NOTE — Telephone Encounter (Signed)
[facility]

## 2017-06-09 ENCOUNTER — Other Ambulatory Visit: Payer: Self-pay | Admitting: Family

## 2017-06-09 DIAGNOSIS — D561 Beta thalassemia: Secondary | ICD-10-CM

## 2017-06-09 DIAGNOSIS — D649 Anemia, unspecified: Secondary | ICD-10-CM

## 2017-08-20 ENCOUNTER — Other Ambulatory Visit: Payer: Self-pay | Admitting: Nurse Practitioner

## 2017-08-26 ENCOUNTER — Encounter: Payer: Self-pay | Admitting: Internal Medicine

## 2017-08-28 ENCOUNTER — Encounter: Payer: Self-pay | Admitting: Internal Medicine

## 2017-08-28 ENCOUNTER — Ambulatory Visit (INDEPENDENT_AMBULATORY_CARE_PROVIDER_SITE_OTHER): Payer: BLUE CROSS/BLUE SHIELD | Admitting: Internal Medicine

## 2017-08-28 VITALS — BP 140/80 | HR 67 | Ht 64.0 in | Wt 150.2 lb

## 2017-08-28 DIAGNOSIS — R0602 Shortness of breath: Secondary | ICD-10-CM

## 2017-08-28 DIAGNOSIS — J3089 Other allergic rhinitis: Secondary | ICD-10-CM

## 2017-08-28 DIAGNOSIS — J302 Other seasonal allergic rhinitis: Secondary | ICD-10-CM

## 2017-08-28 DIAGNOSIS — G4733 Obstructive sleep apnea (adult) (pediatric): Secondary | ICD-10-CM | POA: Diagnosis not present

## 2017-08-28 NOTE — Assessment & Plan Note (Signed)
Sleeps better and better rested with CPAP.  Download confirms good compliance and control. Plan-continue CPAP auto 6-12

## 2017-08-28 NOTE — Progress Notes (Signed)
Subjective:    Patient ID: Brent Griffith, male    DOB: 04-27-1936, 82 y.o.   MRN: [id number]  HPI  male never smoker followed for OSA, periodic limb movement, complicated by CAD/CABG,                   NPSG 12/15/04- AHI  9.2/ hr, desaturation to 76%, PLMA 13.6/ hr, body weight 170 lbs Heart surgery Nov 2010 CABG 4v PFT: 05/04/2014: Normal spirometry flows, insignificant response to bronchodilator, moderate diffusion defect 59% of predicted, possible air trapping ---------------------------------------------------------------------------------------------------------------------  08/28/16- 82 year old male never smoker followed for OSA, periodic limb movement, complicated by CAD/CABG, Dr Donneta Romberg for allergic asthma    Wife here CPAP auto 6-12/Advanced FOLLOWS FOR: DME [facility]. Pt wears CPAP nightly and recently got new supplies. DL attached. He feels very comfortable using CPAP and wife confirms he uses it every night. Download 100% 4 hour compliance with AHI 3.0/hour  He has not used inhalers in a very long time. We discussed role diagnosis of COPD on his chart, not supported by any specific evidence to I am removing it and but admits little shortness of breath and no wheezing. Cough only with postnasal drip which is seasonal. He attributes occasional hoarseness to both postnasal drip and his GERD. Pending stress test today for known CAD.  08/28/2017- 82 year old male never smoker followed for OSA, periodic limb movement, complicated by CAD/CABG,    Wife here CPAP auto 6-12/Advanced ----FOLLOWS FOR: wearing cpap avg 5-6hr. feels pressure & mask are okay. DME:[facility] Has [facility] machine.  Ready for new supplies but cannot afford them yet. Download 93% compliance AHI 4.3/hour. Denies wheezing and has not needed his albuterol rescue inhaler.  Does like his spacer. Complains of postnasal drip and says Dymista nasal spray did not help.  No longer working with Dr. Donneta Romberg. CXR-01/28/2017 Chronic stable  changes of reactive airway disease. Previous CABG. No pneumonia nor CHF. Thoracic aortic atherosclerosis.  Review of Systems- see HPI   + = positive Constitutional:   No-   weight loss, night sweats, fevers, chills, fatigue, lassitude. HEENT:   No-   headaches, difficulty swallowing, tooth/dental problems, sore throat,                  No-   sneezing, itching, ear ache, nasal congestion, +post nasal drip,  CV:  No-   chest pain, orthopnea, PND, swelling in lower extremities, anasarca,dizziness, palpitations GI:  No-   heartburn, indigestion, abdominal pain, nausea, vomiting,  Resp: +  shortness of breath with exertion or at rest.  No-  excess mucus,             No-   productive cough,   non-productive cough,  No-  coughing up of blood.              No-   change in color of mucus.  No- wheezing.   Skin: No-   rash or lesions. GU: . MS:  No-   joint pain or swelling. Marland Kitchen Psych:  No- change in mood or affect. No depression or anxiety.  No memory loss.    Objective:   General- Alert, Oriented, Affect-appropriate, Distress- none acute. Slender Skin- rash-none, lesions- none, excoriation- none Lymphadenopathy- none Head- atraumatic            Eyes- Gross vision intact, PERRLA, conjunctivae clear secretions            Ears- Hearing, canals normal  Nose- Clear, +Septal dev/ external nasal deviation,  No-mucus, polyps, erosion, perforation             Throat- Mallampati II-III , mucosa clear , drainage- none, tonsils- atrophic Neck- flexible , trachea midline, no stridor , thyroid nl, carotid no bruit Chest - symmetrical excursion , unlabored           Heart/CV- RRR , no murmur , no gallop  , no rub, nl s1 s2                           - JVD- none , edema- none, stasis changes- none, varices- none           Lung- clear to P&A, wheeze- none, cough-none, dullness-none, rub- none           Chest wall-  Abd-  Br/ Gen/ Rectal- Not done, not indicated Extrem- cyanosis- none, clubbing,  none, atrophy- none, strength- nl Neuro- grossly intact to observation    Assessment & Plan:

## 2017-08-28 NOTE — Assessment & Plan Note (Signed)
Complains of postnasal drip which is Dymista nasal spray does not help, which would be unusual. Plan-try OTC nonsedating antihistamine.

## 2017-08-28 NOTE — Assessment & Plan Note (Signed)
Denies wheezing and not using his inhaler.  We will watch this.

## 2017-08-28 NOTE — Patient Instructions (Signed)
We can continue CPAP auto 16-12, mask of choice, supplies, humidifier, AirView  For allergies- Suggest an otc antihistamine like Claritin/ loratadine, Allegra or Zyrtec     Generics are ok  Please call if we can help

## 2017-09-03 NOTE — Progress Notes (Signed)
HPI: FU coronary artery disease. Cardiac catheterization on March 04, 2009 revealed severe three-vessel coronary disease as well as an 80% left main. His ejection fraction was 50%. He ultimately underwent coronary artery bypass and graft on November 1 (left internal mammary artery to left anterior descending, sequential saphenous vein graft to ramus intermedius and obtuse marginal 2, saphenous vein graft to posterior descending. Echocardiogram November 2015 showed normal LV function, grade 1 diastolic dysfunction and moderate left atrial enlargement. Nuclear study April 2018 showed ejection fraction 48%. There was prior inferolateral infarct and mild apical ischemia. Patient treated medically. Since he was last seen, the patient has dyspnea with more extreme activities but not with routine activities. It is relieved with rest. It is not associated with chest pain. There is no orthopnea, PND or pedal edema. There is no syncope or palpitations. There is no exertional chest pain.   Current Outpatient Medications  Medication Sig Dispense Refill  . acetaminophen (TYLENOL) 500 MG tablet Take 1,000 mg by mouth every 6 (six) hours as needed for mild pain or moderate pain.    Marland Kitchen albuterol (PROVENTIL HFA;VENTOLIN HFA) 108 (90 Base) MCG/ACT inhaler Inhale 1-2 puffs into the lungs every 6 (six) hours as needed for wheezing or shortness of breath. 8.5 Inhaler 0  . alendronate (FOSAMAX) 70 MG tablet Take 1 tablet by mouth once a week.  3  . aspirin 325 MG EC tablet Take 325 mg by mouth daily.    Marland Kitchen atenolol (TENORMIN) 50 MG tablet TAKE 1 TABLET BY MOUTH EVERY DAY TO CONTROL HEART RHYTHM AND LOWER BLOOD PRESSURE 90 tablet 3  . b complex vitamins tablet Take 1 tablet by mouth daily.    . Calcium Citrate 250 MG TABS Take 250 tablets by mouth daily.     . chlorpheniramine-HYDROcodone (TUSSIONEX PENNKINETIC ER) 10-8 MG/5ML SUER 5 cc every 12 hours if needed for cough 140 mL 0  . cholecalciferol (VITAMIN D) 1000  UNITS tablet Take 1,000 Units by mouth daily.      . Coenzyme Q10 (COQ10) 200 MG CAPS Take by mouth daily.    Marland Kitchen doxazosin (CARDURA) 4 MG tablet ONE DAILY TO HELP CONTROL BP 90 tablet 4  . fexofenadine (ALLEGRA) 180 MG tablet Take 180 mg by mouth daily.    . finasteride (PROSCAR) 5 MG tablet Take 5 mg by mouth every morning.    . folic acid (FOLVITE) 1 MG tablet TAKE 1 TABLET (1 MG TOTAL) BY MOUTH DAILY. 30 tablet 4  . metoCLOPramide (REGLAN) 10 MG tablet Take 0.5 tablets (5 mg total) by mouth every 6 (six) hours as needed for nausea (nausea/headache). 6 tablet 0  . Multiple Vitamin (MULTIVITAMIN) tablet Take 1 tablet by mouth daily.      . pantoprazole (PROTONIX) 40 MG tablet Take 1 tablet (40 mg total) by mouth daily. 90 tablet 3  . Probiotic Product (PROBIOTIC DAILY PO) Take 2 capsules by mouth daily.     . ranitidine (ZANTAC) 150 MG tablet Take 1 tablet (150 mg total) by mouth at bedtime. 90 tablet 3  . rosuvastatin (CRESTOR) 20 MG tablet TAKE ONE TABLET BY MOUTH ONCE DAILY FOR CHOLESTEROL 90 tablet 1  . Saw Palmetto 160 MG CAPS Take by mouth.     No current facility-administered medications for this visit.      Past Medical History:  Diagnosis Date  . Acute gastric ulcer without mention of hemorrhage, perforation, or obstruction   . Allergic rhinitis, cause unspecified   .  Anemia, unspecified   . Arthritis   . Asthma   . Blood transfusion without reported diagnosis   . CAD (coronary artery disease)   . Diaphragmatic hernia without mention of obstruction or gangrene   . Elevated prostate specific antigen (PSA)   . Enlarged prostate   . Esophageal reflux   . Extrinsic asthma, unspecified   . Herpes zoster without mention of complication   . Hyperlipidemia   . Hypersomnia with sleep apnea, unspecified   . Impotence of organic origin   . Kyphosis   . Lumbago   . Obstructive sleep apnea (adult) (pediatric)   . Pneumonia    hx of years ago   . Senile osteoporosis   . Trigger  finger (acquired)   . Tubular adenoma of colon 05/2013  . Unspecified essential hypertension   . Unspecified sleep apnea    CPAP- settings 4-12   . Unspecified vitamin D deficiency     Past Surgical History:  Procedure Laterality Date  . CARDIAC CATHETERIZATION  03/04/2009   Dr [name]  . COLONOSCOPY    . CORONARY ARTERY BYPASS GRAFT  2010  . CYSTOSCOPY WITH RETROGRADE PYELOGRAM, URETEROSCOPY AND STENT PLACEMENT Right 10/04/2013   Procedure: CYSTOSCOPY WITH RETROGRADE PYELOGRAM,  AND STENT PLACEMENT;  Surgeon: Festus Aloe, MD;  Location: [facility];  Service: Urology;  Laterality: Right;  . MOLE REMOVAL  1958   chin  . ROTATOR CUFF REPAIR Left 04/1998   with bone spur removed, Dr French Ana  . TONSILLECTOMY  1945  . TRANSURETHRAL RESECTION OF PROSTATE N/A 09/24/2013   Procedure: TRANSURETHRAL RESECTION OF THE PROSTATE (TURP) WITH GYRUS (STAGED RIGHT LATERAL AND MEDIAN LOBE);  Surgeon: Ailene Rud, MD;  Location: [facility];  Service: Urology;  Laterality: N/A;  . UPPER GI ENDOSCOPY      Social History   Socioeconomic History  . Marital status: Married    Spouse name: Not on file  . Number of children: 0  . Years of education: Not on file  . Highest education level: Not on file  Occupational History  . Occupation: [employer]  Social Needs  . Financial resource strain: Not on file  . Food insecurity:    Worry: Not on file    Inability: Not on file  . Transportation needs:    Medical: Not on file    Non-medical: Not on file  Tobacco Use  . Smoking status: Never Smoker  . Smokeless tobacco: Never Used  Substance and Sexual Activity  . Alcohol use: No  . Drug use: No  . Sexual activity: Not Currently  Lifestyle  . Physical activity:    Days per week: Not on file    Minutes per session: Not on file  . Stress: Not on file  Relationships  . Social connections:    Talks on phone: Not on file    Gets together: Not on file    Attends religious  service: Not on file    Active member of club or organization: Not on file    Attends meetings of clubs or organizations: Not on file    Relationship status: Not on file  . Intimate partner violence:    Fear of current or ex partner: Not on file    Emotionally abused: Not on file    Physically abused: Not on file    Forced sexual activity: Not on file  Other Topics Concern  . Not on file  Social History Narrative  . Not on file  Family History  Problem Relation Age of Onset  . Stroke Mother   . Breast cancer Mother   . Cirrhosis Mother        wine  . Rheumatic fever Mother   . Kyphosis Mother   . Hypertension Father   . Stroke Father   . Heart disease Father   . Heart disease Paternal Grandfather   . Colon cancer Neg Hx     ROS: no fevers or chills, productive cough, hemoptysis, dysphasia, odynophagia, melena, hematochezia, dysuria, hematuria, rash, seizure activity, orthopnea, PND, pedal edema, claudication. Remaining systems are negative.  Physical Exam: Well-developed well-nourished in no acute distress.  Skin is warm and dry.  HEENT is normal.  Neck is supple.  Chest is clear to auscultation with normal expansion.  Cardiovascular exam is regular rate and rhythm.  Abdominal exam nontender or distended. No masses palpated. Extremities show no edema. neuro grossly intact   A/P  1 coronary artery disease-patient appears to be doing well from a symptomatic standpoint.  No chest pain.  Continue medical therapy including aspirin, statin and beta-blocker.  2 hypertension-blood pressure is controlled.  Continue present medications.  3 hyperlipidemia-continue statin.  Kirk Ruths, MD

## 2017-09-04 ENCOUNTER — Encounter: Payer: Self-pay | Admitting: Cardiology

## 2017-09-04 ENCOUNTER — Ambulatory Visit (INDEPENDENT_AMBULATORY_CARE_PROVIDER_SITE_OTHER): Payer: BLUE CROSS/BLUE SHIELD | Admitting: Cardiology

## 2017-09-04 VITALS — BP 116/79 | HR 65 | Ht 64.0 in | Wt 150.4 lb

## 2017-09-04 DIAGNOSIS — I10 Essential (primary) hypertension: Secondary | ICD-10-CM | POA: Diagnosis not present

## 2017-09-04 DIAGNOSIS — E7800 Pure hypercholesterolemia, unspecified: Secondary | ICD-10-CM

## 2017-09-04 DIAGNOSIS — I2510 Atherosclerotic heart disease of native coronary artery without angina pectoris: Secondary | ICD-10-CM

## 2017-09-04 NOTE — Patient Instructions (Signed)
Your physician wants you to follow-up in: [place] will receive a reminder letter in the mail two months in advance. If you don't receive a letter, please call our office to schedule the follow-up appointme  If you need a refill on your cardiac medications before your next appointment, please call your pharmacy. nt.

## 2017-10-04 ENCOUNTER — Other Ambulatory Visit: Payer: BLUE CROSS/BLUE SHIELD

## 2017-10-04 DIAGNOSIS — E785 Hyperlipidemia, unspecified: Secondary | ICD-10-CM

## 2017-10-04 DIAGNOSIS — D508 Other iron deficiency anemias: Secondary | ICD-10-CM

## 2017-10-04 LAB — CBC WITH DIFFERENTIAL/PLATELET
BASOS ABS: 22 {cells}/uL (ref 0–200)
Basophils Relative: 0.4 %
Eosinophils Absolute: 130 cells/uL (ref 15–500)
Eosinophils Relative: 2.4 %
HEMATOCRIT: 37 % — AB (ref 38.5–50.0)
HEMOGLOBIN: 12.7 g/dL — AB (ref 13.2–17.1)
Lymphs Abs: 1215 cells/uL (ref 850–3900)
MCH: 29.7 pg (ref 27.0–33.0)
MCHC: 34.3 g/dL (ref 32.0–36.0)
MCV: 86.7 fL (ref 80.0–100.0)
MONOS PCT: 7.6 %
MPV: 11 fL (ref 7.5–12.5)
NEUTROS ABS: 3623 {cells}/uL (ref 1500–7800)
Neutrophils Relative %: 67.1 %
Platelets: 168 10*3/uL (ref 140–400)
RBC: 4.27 10*6/uL (ref 4.20–5.80)
RDW: 12.1 % (ref 11.0–15.0)
Total Lymphocyte: 22.5 %
WBC mixed population: 410 cells/uL (ref 200–950)
WBC: 5.4 10*3/uL (ref 3.8–10.8)

## 2017-10-04 LAB — COMPLETE METABOLIC PANEL WITH GFR
AG Ratio: 1.8 (calc) (ref 1.0–2.5)
ALT: 15 U/L (ref 9–46)
AST: 21 U/L (ref 10–35)
Albumin: 4 g/dL (ref 3.6–5.1)
Alkaline phosphatase (APISO): 39 U/L — ABNORMAL LOW (ref 40–115)
BUN: 11 mg/dL (ref 7–25)
CALCIUM: 9.3 mg/dL (ref 8.6–10.3)
CO2: 28 mmol/L (ref 20–32)
CREATININE: 0.96 mg/dL (ref 0.70–1.18)
Chloride: 106 mmol/L (ref 98–110)
GFR, EST NON AFRICAN AMERICAN: 75 mL/min/{1.73_m2} (ref >=60)
GFR, Est African American: 87 mL/min/{1.73_m2} (ref >=60)
Globulin: 2.2 g/dL (calc) (ref 1.9–3.7)
Glucose, Bld: 98 mg/dL (ref 65–99)
Potassium: 4.2 mmol/L (ref 3.5–5.3)
Sodium: 138 mmol/L (ref 135–146)
Total Bilirubin: 1.4 mg/dL — ABNORMAL HIGH (ref 0.2–1.2)
Total Protein: 6.2 g/dL (ref 6.1–8.1)

## 2017-10-04 LAB — LIPID PANEL
CHOL/HDL RATIO: 2.1 (calc) (ref <5.0)
CHOLESTEROL: 118 mg/dL (ref <200)
HDL: 55 mg/dL (ref >40)
LDL Cholesterol (Calc): 47 mg/dL (calc)
NON-HDL CHOLESTEROL (CALC): 63 mg/dL (ref <130)
Triglycerides: 78 mg/dL (ref <150)

## 2017-10-07 ENCOUNTER — Other Ambulatory Visit: Payer: BLUE CROSS/BLUE SHIELD

## 2017-10-09 ENCOUNTER — Ambulatory Visit: Payer: BLUE CROSS/BLUE SHIELD | Admitting: Nurse Practitioner

## 2017-10-11 ENCOUNTER — Ambulatory Visit (INDEPENDENT_AMBULATORY_CARE_PROVIDER_SITE_OTHER): Payer: BLUE CROSS/BLUE SHIELD | Admitting: Nurse Practitioner

## 2017-10-11 ENCOUNTER — Encounter: Payer: Self-pay | Admitting: Nurse Practitioner

## 2017-10-11 VITALS — BP 132/84 | HR 61 | Temp 98.6°F | Ht 64.0 in | Wt 150.6 lb

## 2017-10-11 DIAGNOSIS — G4733 Obstructive sleep apnea (adult) (pediatric): Secondary | ICD-10-CM | POA: Diagnosis not present

## 2017-10-11 DIAGNOSIS — M810 Age-related osteoporosis without current pathological fracture: Secondary | ICD-10-CM | POA: Diagnosis not present

## 2017-10-11 DIAGNOSIS — I10 Essential (primary) hypertension: Secondary | ICD-10-CM | POA: Diagnosis not present

## 2017-10-11 DIAGNOSIS — N401 Enlarged prostate with lower urinary tract symptoms: Secondary | ICD-10-CM | POA: Diagnosis not present

## 2017-10-11 DIAGNOSIS — E785 Hyperlipidemia, unspecified: Secondary | ICD-10-CM | POA: Diagnosis not present

## 2017-10-11 DIAGNOSIS — I25810 Atherosclerosis of coronary artery bypass graft(s) without angina pectoris: Secondary | ICD-10-CM | POA: Diagnosis not present

## 2017-10-11 DIAGNOSIS — K219 Gastro-esophageal reflux disease without esophagitis: Secondary | ICD-10-CM

## 2017-10-11 DIAGNOSIS — D561 Beta thalassemia: Secondary | ICD-10-CM

## 2017-10-11 DIAGNOSIS — D649 Anemia, unspecified: Secondary | ICD-10-CM

## 2017-10-11 DIAGNOSIS — L237 Allergic contact dermatitis due to plants, except food: Secondary | ICD-10-CM | POA: Diagnosis not present

## 2017-10-11 DIAGNOSIS — I2510 Atherosclerotic heart disease of native coronary artery without angina pectoris: Secondary | ICD-10-CM | POA: Insufficient documentation

## 2017-10-11 MED ORDER — FOLIC ACID 1 MG PO TABS: 1.0000 mg | ORAL_TABLET | Freq: Every day | ORAL | 1 refills | Status: DC

## 2017-10-11 NOTE — Progress Notes (Signed)
Careteam: Patient Care Team: [name], NP as PCP - General (Geriatric Medicine)  Advanced Directive information Does Patient Have a Medical Advance Directive?: No  Allergies  Allergen Reactions  . Compazine [Prochlorperazine Edisylate] Anxiety    Chief Complaint  Patient presents with  . Medical Management of Chronic Issues    Pt is being seen for a 6 month routine visit.   . ACP    needed     HPI: Patient is a 82 y.o. male seen in the office today for routine follow up.  Pt reports he has been stung by a yellow jacket a couple of days ago and poison ivy when he was pulling weeds.  Had 2 stings. Got some woodlock oil that he has put on sting. Area was bright red but now pink.  Poison ivy is doing better- very itchy, using calamine lotion which has helped   Hyperlipidemia- taking crestor, not doing that good on diet. LDL at goal 47.   GERD- uses protonix and zantac to help control symptoms.   BPH- following with urologist, s/p TURP and  on proscar and cardura  Followed by Dr Annamaria Boots for OSA and seasonal allergies   CAD- s/p cath in 2010 which led to CABG following with Cardiologist Dr Stanford Breed  Review of Systems:  Review of Systems  Constitutional: Negative for chills, fever and weight loss.  HENT: Negative for tinnitus.   Respiratory: Negative for cough, sputum production and shortness of breath.   Cardiovascular: Negative for chest pain, palpitations and leg swelling.  Gastrointestinal: Positive for heartburn (controlled on current regimen). Negative for abdominal pain, constipation and diarrhea.  Genitourinary: Negative for dysuria, frequency and urgency.  Musculoskeletal: Positive for back pain. Negative for falls, joint pain and myalgias.  Skin: Negative.   Neurological: Negative for dizziness and headaches.  Psychiatric/Behavioral: Negative for depression and memory loss. The patient does not have insomnia.     Past Medical History:  Diagnosis Date   . Acute gastric ulcer without mention of hemorrhage, perforation, or obstruction   . Allergic rhinitis, cause unspecified   . Anemia, unspecified   . Arthritis   . Asthma   . Blood transfusion without reported diagnosis   . CAD (coronary artery disease)   . Diaphragmatic hernia without mention of obstruction or gangrene   . Elevated prostate specific antigen (PSA)   . Enlarged prostate   . Esophageal reflux   . Extrinsic asthma, unspecified   . Herpes zoster without mention of complication   . Hyperlipidemia   . Hypersomnia with sleep apnea, unspecified   . Impotence of organic origin   . Kyphosis   . Lumbago   . Obstructive sleep apnea (adult) (pediatric)   . Pneumonia    hx of years ago   . Senile osteoporosis   . Trigger finger (acquired)   . Tubular adenoma of colon 05/2013  . Unspecified essential hypertension   . Unspecified sleep apnea    CPAP- settings 4-12   . Unspecified vitamin D deficiency    Past Surgical History:  Procedure Laterality Date  . CARDIAC CATHETERIZATION  03/04/2009   Dr [name]  . COLONOSCOPY    . CORONARY ARTERY BYPASS GRAFT  2010  . CYSTOSCOPY WITH RETROGRADE PYELOGRAM, URETEROSCOPY AND STENT PLACEMENT Right 10/04/2013   Procedure: CYSTOSCOPY WITH RETROGRADE PYELOGRAM,  AND STENT PLACEMENT;  Surgeon: Festus Aloe, MD;  Location: [facility];  Service: Urology;  Laterality: Right;  . MOLE REMOVAL  1958   chin  .  ROTATOR CUFF REPAIR Left 04/1998   with bone spur removed, Dr French Ana  . TONSILLECTOMY  1945  . TRANSURETHRAL RESECTION OF PROSTATE N/A 09/24/2013   Procedure: TRANSURETHRAL RESECTION OF THE PROSTATE (TURP) WITH GYRUS (STAGED RIGHT LATERAL AND MEDIAN LOBE);  Surgeon: Ailene Rud, MD;  Location: [facility];  Service: Urology;  Laterality: N/A;  . UPPER GI ENDOSCOPY     Social History:   reports that he has never smoked. He has never used smokeless tobacco. He reports that he does not drink alcohol or use drugs.  Family  History  Problem Relation Age of Onset  . Stroke Mother   . Breast cancer Mother   . Cirrhosis Mother        wine  . Rheumatic fever Mother   . Kyphosis Mother   . Hypertension Father   . Stroke Father   . Heart disease Father   . Heart disease Paternal Grandfather   . Colon cancer Neg Hx     Medications: Patient's Medications  New Prescriptions   No medications on file  Previous Medications   ACETAMINOPHEN (TYLENOL) 500 MG TABLET    Take 1,000 mg by mouth every 6 (six) hours as needed for mild pain or moderate pain.   ALBUTEROL (PROVENTIL HFA;VENTOLIN HFA) 108 (90 BASE) MCG/ACT INHALER    Inhale 1-2 puffs into the lungs every 6 (six) hours as needed for wheezing or shortness of breath.   ALENDRONATE (FOSAMAX) 70 MG TABLET    Take 1 tablet by mouth once a week.   ASPIRIN 325 MG EC TABLET    Take 325 mg by mouth daily.   ATENOLOL (TENORMIN) 50 MG TABLET    TAKE 1 TABLET BY MOUTH EVERY DAY TO CONTROL HEART RHYTHM AND LOWER BLOOD PRESSURE   B COMPLEX VITAMINS TABLET    Take 1 tablet by mouth daily.   CALCIUM CITRATE 250 MG TABS    Take 250 tablets by mouth daily.    CHLORPHENIRAMINE-HYDROCODONE (TUSSIONEX PENNKINETIC ER) 10-8 MG/5ML SUER    5 cc every 12 hours if needed for cough   CHOLECALCIFEROL (VITAMIN D-3) 5000 UNITS TABS    Take 2 tablets by mouth daily.   COENZYME Q10 (COQ10) 200 MG CAPS    Take by mouth daily.   DOXAZOSIN (CARDURA) 4 MG TABLET    ONE DAILY TO HELP CONTROL BP   FEXOFENADINE (ALLEGRA) 180 MG TABLET    Take 180 mg by mouth daily.   FINASTERIDE (PROSCAR) 5 MG TABLET    Take 5 mg by mouth every morning.   FOLIC ACID (FOLVITE) 1 MG TABLET    TAKE 1 TABLET (1 MG TOTAL) BY MOUTH DAILY.   METOCLOPRAMIDE (REGLAN) 10 MG TABLET    Take 0.5 tablets (5 mg total) by mouth every 6 (six) hours as needed for nausea (nausea/headache).   MULTIPLE VITAMIN (MULTIVITAMIN) TABLET    Take 1 tablet by mouth daily.     PANTOPRAZOLE (PROTONIX) 40 MG TABLET    Take 1 tablet (40 mg total)  by mouth daily.   PROBIOTIC PRODUCT (PROBIOTIC DAILY PO)    Take 2 capsules by mouth daily.    RANITIDINE (ZANTAC) 150 MG TABLET    Take 1 tablet (150 mg total) by mouth at bedtime.   ROSUVASTATIN (CRESTOR) 20 MG TABLET    TAKE ONE TABLET BY MOUTH ONCE DAILY FOR CHOLESTEROL   SAW PALMETTO 160 MG CAPS    Take by mouth.  Modified Medications   No medications on  file  Discontinued Medications   CHOLECALCIFEROL (VITAMIN D) 1000 UNITS TABLET    Take 1,000 Units by mouth daily.       Physical Exam:  Vitals:   10/11/17 1011  BP: 132/84  Pulse: 61  Temp: 98.6 F (37 C)  TempSrc: Oral  SpO2: 98%  Weight: 150 lb 9.6 oz (68.3 kg)  Height: 5\' 4"  (1.626 m)   Body mass index is 25.85 kg/m.  Physical Exam  Constitutional: He is oriented to person, place, and time. He appears well-developed and well-nourished. No distress.  HENT:  Head: Normocephalic and atraumatic.  Nose: Nose normal.  Eyes: Pupils are equal, round, and reactive to light. Conjunctivae and EOM are normal.  Neck: Normal range of motion. Neck supple.  Cardiovascular: Normal rate, regular rhythm and normal heart sounds.  Pulmonary/Chest: Effort normal and breath sounds normal. No respiratory distress.  Abdominal: Soft. Bowel sounds are normal.  Musculoskeletal: Normal range of motion. He exhibits no edema or tenderness.  Neurological: He is alert and oriented to person, place, and time. He has normal reflexes. No cranial nerve deficit. Coordination normal.  Skin: Skin is warm and dry. Rash (patches of poison ivy noted to medial aspect of bilateral LE, mild erythema noted.) noted. No erythema. No pallor.  Psychiatric: He has a normal mood and affect. His behavior is normal. Judgment and thought content normal.    Labs reviewed: Basic Metabolic Panel: Recent Labs    01/28/17 0738 04/10/17 1457 10/04/17 1007  NA 137 139 138  K 4.3 4.2 4.2  CL 107 105 106  CO2 25 29 28   GLUCOSE 113* 127 98  BUN 19 16 11   CREATININE  1.00 0.92 0.96  CALCIUM 8.8* 9.1 9.3   Liver Function Tests: Recent Labs    04/10/17 1457 10/04/17 1007  AST 22 21  ALT 18 15  BILITOT 1.5* 1.4*  PROT 6.1 6.2   No results for input(s): LIPASE, AMYLASE in the last 8760 hours. No results for input(s): AMMONIA in the last 8760 hours. CBC: Recent Labs    12/28/16 1508 01/28/17 0738 04/10/17 1457 10/04/17 1007  WBC 6.4 5.7 5.9 5.4  NEUTROABS 4.2  --  4,030 3,623  HGB 12.9* 12.4* 13.0* 12.7*  HCT 36.9* 35.7* 37.1* 37.0*  MCV 88 87.9 90.7 86.7  PLT 164 155 188 168   Lipid Panel: Recent Labs    10/04/17 1007  CHOL 118  HDL 55  LDLCALC 47  TRIG 78  CHOLHDL 2.1   TSH: No results for input(s): TSH in the last 8760 hours. A1C: Lab Results  Component Value Date   HGBA1C 5.6 07/28/2012     Assessment/Plan 1. Beta thalassemia (HCC) - folic acid (FOLVITE) 1 MG tablet; Take 1 tablet (1 mg total) by mouth daily.  Dispense: 90 tablet; Refill: 1 - CBC with Differential/Platelets; Future  2. Anemia, unspecified type -stable at this time  - folic acid (FOLVITE) 1 MG tablet; Take 1 tablet (1 mg total) by mouth daily.  Dispense: 90 tablet; Refill: 1 - CBC with Differential/Platelets; Future  3. Benign prostatic hyperplasia with lower urinary tract symptoms, symptom details unspecified Stable, continues to follow up with urologist, will cont current regimen  4. Dyslipidemia At goal, continues on crestor - COMPLETE METABOLIC PANEL WITH GFR; Future  5. Essential hypertension Stable on current regimen to continue current medication at this time.  - COMPLETE METABOLIC PANEL WITH GFR; Future  6. Gastroesophageal reflux disease without esophagitis Stable on protonix with zantac.encouraged dietary  modifications as well.   7. Senile osteoporosis Followed by orthopedic, he was previously on prolia and now on fosamax. continues on calcium and vit d with weight bearing activity.   8. Obstructive sleep apnea Continues on CPAP,  following with pulmonary.   9. Coronary artery disease involving coronary bypass graft of native heart without angina pectoris Stable, continues to follow up with cardiologist, continues on ASA and statin.   10. Poison Ivy Improving at this time, no signs of infection at this time, to monitor for increase redness, heat and pain.   Next appt: 6 month follow up with lab work prior to visit.  Carlos American. [place], [facility] [phone]

## 2017-10-21 ENCOUNTER — Other Ambulatory Visit: Payer: Self-pay | Admitting: Gastroenterology

## 2017-11-06 ENCOUNTER — Telehealth: Payer: Self-pay | Admitting: Gastroenterology

## 2017-11-06 MED ORDER — RANITIDINE HCL 150 MG PO TABS: 150.0000 mg | ORAL_TABLET | Freq: Every day | ORAL | 0 refills | Status: DC

## 2017-11-06 NOTE — Telephone Encounter (Signed)
Prescription sent to patient's pharmacy and patient notified to keep appt for further refills. 

## 2017-12-13 ENCOUNTER — Other Ambulatory Visit: Payer: Self-pay | Admitting: Nurse Practitioner

## 2018-01-10 ENCOUNTER — Encounter: Payer: Self-pay | Admitting: Gastroenterology

## 2018-01-10 ENCOUNTER — Ambulatory Visit (INDEPENDENT_AMBULATORY_CARE_PROVIDER_SITE_OTHER): Payer: BLUE CROSS/BLUE SHIELD | Admitting: Gastroenterology

## 2018-01-10 VITALS — BP 110/58 | HR 64 | Ht 64.0 in | Wt 149.1 lb

## 2018-01-10 DIAGNOSIS — K5904 Chronic idiopathic constipation: Secondary | ICD-10-CM

## 2018-01-10 DIAGNOSIS — K219 Gastro-esophageal reflux disease without esophagitis: Secondary | ICD-10-CM

## 2018-01-10 MED ORDER — PANTOPRAZOLE SODIUM 40 MG PO TBEC: 40.0000 mg | DELAYED_RELEASE_TABLET | Freq: Every day | ORAL | 3 refills | Status: DC

## 2018-01-10 MED ORDER — RANITIDINE HCL 150 MG PO TABS: 150.0000 mg | ORAL_TABLET | Freq: Every day | ORAL | 3 refills | Status: DC

## 2018-01-10 NOTE — Patient Instructions (Signed)
You can take Colace or generic daily for constipation.   We have sent the following medications to your pharmacy for you to pick up at your convenience: pantoprazole and ranitidine.   Thank you for choosing me and [facility].  Pricilla Riffle. Dagoberto Ligas., MD., [facility]

## 2018-01-10 NOTE — Progress Notes (Signed)
    History of Present Illness: This is an 82 year old male GERD.  He relates frequent postprandial fullness particularly after large evening meals.  He notes occasional epigastric discomfort and reflux symptoms at night.  He complains of ongoing significant sinus drainage and swallowing mucus.  He feels this leads to GI upset at times.  He has problems with hard stools intermittently.  He has taken generic form of Colace with improvement in symptoms.  Current Medications, Allergies, Past Medical History, Past Surgical History, Family History and Social History were reviewed in [employer] record.  Physical Exam: General: Well developed, well nourished, no acute distress Head: Normocephalic and atraumatic Eyes:  sclerae anicteric, EOMI Ears: Normal auditory acuity Mouth: No deformity or lesions Lungs: Clear throughout to auscultation Heart: Regular rate and rhythm; no murmurs, rubs or bruits Abdomen: Soft, non tender and non distended. No masses, hepatosplenomegaly or hernias noted. Normal Bowel sounds Rectal: Not done Musculoskeletal: Symmetrical with no gross deformities  Pulses:  Normal pulses noted Extremities: No clubbing, cyanosis, edema or deformities noted Neurological: Alert oriented x 4, grossly nonfocal Psychological:  Alert and cooperative. Normal mood and affect  Assessment and Recommendations:  1. GERD.  Follow standard antireflux measures.  Avoid large or heavy evening meals.  Avoid any food or beverage  except water within 3 hours of recumbency.  Advised him to avoid the use of metoclopramide due to side effect profile.  Continue pantoprazole 40 mg p.o. every morning and ranitidine 150 mg at bedtime.  If symptoms are not better controlled recommend proceeding with EGD.  In addition consider changing to ranitidine 300 mg at bedtime and pantoprazole twice daily.  2.  Mild constipation.  Increase daily fiber and water intake.  Trial of Benefiber daily.   Colace or generic equivalent once or twice daily.  3. Sinus drainage.  Advised him to follow-up with his PCP for further evaluation.

## 2018-02-15 ENCOUNTER — Other Ambulatory Visit: Payer: Self-pay | Admitting: Nurse Practitioner

## 2018-02-26 NOTE — Progress Notes (Signed)
HPI: FU coronary artery disease.Cardiac catheterization on March 04, 2009 revealed severe three-vessel coronary disease as well as an 80% left main. His ejection fraction was 50%. He ultimately underwent coronary artery bypass and graft on November 1 (left internal mammary artery to left anterior descending, sequential saphenous vein graft to ramus intermedius and obtuse marginal 2, saphenous vein graft to posterior descending. Echocardiogram November 2015 showed normal LV function, grade 1 diastolic dysfunction and moderate left atrial enlargement.Nuclear study April 2018 showed ejection fraction 48%. There was prior inferolateral infarct and mild apical ischemia. Patient treated medically.Since he was last seen, the patient denies any dyspnea on exertion, orthopnea, PND, pedal edema, palpitations, syncope or chest pain.    Current Outpatient Medications  Medication Sig Dispense Refill  . acetaminophen (TYLENOL) 500 MG tablet Take 1,000 mg by mouth every 6 (six) hours as needed for mild pain or moderate pain.    Marland Kitchen albuterol (PROVENTIL HFA;VENTOLIN HFA) 108 (90 Base) MCG/ACT inhaler Inhale 1-2 puffs into the lungs every 6 (six) hours as needed for wheezing or shortness of breath. 8.5 Inhaler 0  . alendronate (FOSAMAX) 70 MG tablet Take 1 tablet by mouth once a week.  3  . aspirin 325 MG EC tablet Take 325 mg by mouth daily.    Marland Kitchen atenolol (TENORMIN) 50 MG tablet TAKE 1 TABLET BY MOUTH EVERY DAY TO CONTROL HEART RHYTHM AND LOWER BLOOD PRESSURE 90 tablet 1  . b complex vitamins tablet Take 1 tablet by mouth daily.    . Calcium Citrate 250 MG TABS Take 250 tablets by mouth daily.     . chlorpheniramine-HYDROcodone (TUSSIONEX PENNKINETIC ER) 10-8 MG/5ML SUER 5 cc every 12 hours if needed for cough 140 mL 0  . Cholecalciferol (VITAMIN D-3) 5000 units TABS Take 2 tablets by mouth daily.    . Coenzyme Q10 (COQ10) 200 MG CAPS Take by mouth daily.    Marland Kitchen doxazosin (CARDURA) 4 MG tablet ONE DAILY TO  HELP CONTROL BP 90 tablet 4  . fexofenadine (ALLEGRA) 180 MG tablet Take 180 mg by mouth daily.    . finasteride (PROSCAR) 5 MG tablet Take 5 mg by mouth every morning.    . folic acid (FOLVITE) 1 MG tablet Take 1 tablet (1 mg total) by mouth daily. 90 tablet 1  . metoCLOPramide (REGLAN) 10 MG tablet Take 0.5 tablets (5 mg total) by mouth every 6 (six) hours as needed for nausea (nausea/headache). 6 tablet 0  . Multiple Vitamin (MULTIVITAMIN) tablet Take 1 tablet by mouth daily.      . pantoprazole (PROTONIX) 40 MG tablet Take 1 tablet (40 mg total) by mouth daily. 90 tablet 3  . Probiotic Product (PROBIOTIC DAILY PO) Take 2 capsules by mouth daily.     . ranitidine (ZANTAC) 150 MG tablet Take 1 tablet (150 mg total) by mouth at bedtime. 90 tablet 3  . rosuvastatin (CRESTOR) 20 MG tablet TAKE ONE TABLET BY MOUTH ONCE DAILY FOR CHOLESTEROL 90 tablet 1  . Saw Palmetto 160 MG CAPS Take by mouth.     No current facility-administered medications for this visit.      Past Medical History:  Diagnosis Date  . Acute gastric ulcer without mention of hemorrhage, perforation, or obstruction   . Allergic rhinitis, cause unspecified   . Anemia, unspecified   . Arthritis   . Asthma   . Blood transfusion without reported diagnosis   . CAD (coronary artery disease)   . Diaphragmatic hernia without mention  of obstruction or gangrene   . Elevated prostate specific antigen (PSA)   . Enlarged prostate   . Esophageal reflux   . Extrinsic asthma, unspecified   . Herpes zoster without mention of complication   . Hyperlipidemia   . Hypersomnia with sleep apnea, unspecified   . Impotence of organic origin   . Kyphosis   . Lumbago   . Obstructive sleep apnea (adult) (pediatric)   . Pneumonia    hx of years ago   . Senile osteoporosis   . Trigger finger (acquired)   . Tubular adenoma of colon 05/2013  . Unspecified essential hypertension   . Unspecified sleep apnea    CPAP- settings 4-12   .  Unspecified vitamin D deficiency     Past Surgical History:  Procedure Laterality Date  . CARDIAC CATHETERIZATION  03/04/2009   Dr [name]  . COLONOSCOPY    . CORONARY ARTERY BYPASS GRAFT  2010  . CYSTOSCOPY WITH RETROGRADE PYELOGRAM, URETEROSCOPY AND STENT PLACEMENT Right 10/04/2013   Procedure: CYSTOSCOPY WITH RETROGRADE PYELOGRAM,  AND STENT PLACEMENT;  Surgeon: Festus Aloe, MD;  Location: [facility];  Service: Urology;  Laterality: Right;  . MOLE REMOVAL  1958   chin  . ROTATOR CUFF REPAIR Left 04/1998   with bone spur removed, Dr French Ana  . TONSILLECTOMY  1945  . TRANSURETHRAL RESECTION OF PROSTATE N/A 09/24/2013   Procedure: TRANSURETHRAL RESECTION OF THE PROSTATE (TURP) WITH GYRUS (STAGED RIGHT LATERAL AND MEDIAN LOBE);  Surgeon: Ailene Rud, MD;  Location: [facility];  Service: Urology;  Laterality: N/A;  . UPPER GI ENDOSCOPY      Social History   Socioeconomic History  . Marital status: Married    Spouse name: Not on file  . Number of children: 0  . Years of education: Not on file  . Highest education level: Not on file  Occupational History  . Occupation: [employer]  Social Needs  . Financial resource strain: Not on file  . Food insecurity:    Worry: Not on file    Inability: Not on file  . Transportation needs:    Medical: Not on file    Non-medical: Not on file  Tobacco Use  . Smoking status: Never Smoker  . Smokeless tobacco: Never Used  Substance and Sexual Activity  . Alcohol use: No  . Drug use: No  . Sexual activity: Not Currently  Lifestyle  . Physical activity:    Days per week: Not on file    Minutes per session: Not on file  . Stress: Not on file  Relationships  . Social connections:    Talks on phone: Not on file    Gets together: Not on file    Attends religious service: Not on file    Active member of club or organization: Not on file    Attends meetings of clubs or organizations: Not on file    Relationship  status: Not on file  . Intimate partner violence:    Fear of current or ex partner: Not on file    Emotionally abused: Not on file    Physically abused: Not on file    Forced sexual activity: Not on file  Other Topics Concern  . Not on file  Social History Narrative  . Not on file    Family History  Problem Relation Age of Onset  . Stroke Mother   . Breast cancer Mother   . Cirrhosis Mother  wine  . Rheumatic fever Mother   . Kyphosis Mother   . Hypertension Father   . Stroke Father   . Heart disease Father   . Heart disease Paternal Grandfather   . Colon cancer Neg Hx     ROS: no fevers or chills, productive cough, hemoptysis, dysphasia, odynophagia, melena, hematochezia, dysuria, hematuria, rash, seizure activity, orthopnea, PND, pedal edema, claudication. Remaining systems are negative.  Physical Exam: Well-developed well-nourished in no acute distress.  Skin is warm and dry.  HEENT is normal.  Neck is supple.  Chest is clear to auscultation with normal expansion.  Cardiovascular exam is regular rate and rhythm.  Abdominal exam nontender or distended. No masses palpated. Extremities show no edema. neuro grossly intact  ECG-sinus bradycardia at a rate of 57.  Inferior lateral T wave inversion.  Personally reviewed  A/P  1 coronary artery disease-patient continues to do well from a symptomatic standpoint with no chest pain or dyspnea.  Plan to continue medical therapy with aspirin (change dose to 81 mg daily) and statin.  2 hypertension-patient's blood pressure is controlled today.  Continue present medications and follow.  Laboratories from May 2019 personally reviewed.  Sodium 138 with potassium 4.2.  Creatinine 0.96.  3 hyperlipidemia-continue statin.  Lipids from May 2019 personally reviewed.  Total cholesterol 118 with LDL 47.  Liver functions normal with exception of mildly elevated bilirubin at 1.4.  Kirk Ruths, MD

## 2018-03-05 ENCOUNTER — Ambulatory Visit (INDEPENDENT_AMBULATORY_CARE_PROVIDER_SITE_OTHER): Payer: BLUE CROSS/BLUE SHIELD | Admitting: Cardiology

## 2018-03-05 ENCOUNTER — Encounter: Payer: Self-pay | Admitting: Cardiology

## 2018-03-05 VITALS — BP 114/62 | HR 57 | Ht 64.0 in | Wt 146.4 lb

## 2018-03-05 DIAGNOSIS — I2510 Atherosclerotic heart disease of native coronary artery without angina pectoris: Secondary | ICD-10-CM

## 2018-03-05 MED ORDER — ASPIRIN EC 81 MG PO TBEC: 81.0000 mg | DELAYED_RELEASE_TABLET | Freq: Every day | ORAL | 3 refills | Status: DC

## 2018-03-05 NOTE — Patient Instructions (Signed)
Medication Instructions:   DECREASE ASPIRIN TO 81 MG ONCE DAILY  Follow-Up:  Your physician recommends that you schedule a follow-up appointment in: [place] 2 MONTHS PRIOR TO THAT APPOINTMENT TIME TO SCHEDULE

## 2018-03-19 ENCOUNTER — Ambulatory Visit (INDEPENDENT_AMBULATORY_CARE_PROVIDER_SITE_OTHER): Payer: Medicare Other

## 2018-03-19 DIAGNOSIS — Z23 Encounter for immunization: Secondary | ICD-10-CM | POA: Diagnosis not present

## 2018-04-18 ENCOUNTER — Other Ambulatory Visit: Payer: BLUE CROSS/BLUE SHIELD

## 2018-04-18 DIAGNOSIS — E785 Hyperlipidemia, unspecified: Secondary | ICD-10-CM | POA: Diagnosis not present

## 2018-04-18 DIAGNOSIS — D561 Beta thalassemia: Secondary | ICD-10-CM | POA: Diagnosis not present

## 2018-04-18 DIAGNOSIS — I10 Essential (primary) hypertension: Secondary | ICD-10-CM

## 2018-04-18 DIAGNOSIS — D649 Anemia, unspecified: Secondary | ICD-10-CM

## 2018-04-18 LAB — COMPLETE METABOLIC PANEL WITH GFR
AG RATIO: 1.5 (calc) (ref 1.0–2.5)
ALKALINE PHOSPHATASE (APISO): 36 U/L — AB (ref 40–115)
ALT: 15 U/L (ref 9–46)
AST: 21 U/L (ref 10–35)
Albumin: 4 g/dL (ref 3.6–5.1)
BUN: 16 mg/dL (ref 7–25)
CHLORIDE: 102 mmol/L (ref 98–110)
CO2: 28 mmol/L (ref 20–32)
Calcium: 9.3 mg/dL (ref 8.6–10.3)
Creat: 0.95 mg/dL (ref 0.70–1.11)
GFR, Est African American: 87 mL/min/{1.73_m2} (ref >=60)
GFR, Est Non African American: 75 mL/min/{1.73_m2} (ref >=60)
GLOBULIN: 2.7 g/dL (ref 1.9–3.7)
Glucose, Bld: 100 mg/dL — ABNORMAL HIGH (ref 65–99)
POTASSIUM: 4.4 mmol/L (ref 3.5–5.3)
SODIUM: 135 mmol/L (ref 135–146)
Total Bilirubin: 1.4 mg/dL — ABNORMAL HIGH (ref 0.2–1.2)
Total Protein: 6.7 g/dL (ref 6.1–8.1)

## 2018-04-18 LAB — CBC WITH DIFFERENTIAL/PLATELET
BASOS ABS: 20 {cells}/uL (ref 0–200)
Basophils Relative: 0.4 %
EOS PCT: 2.1 %
Eosinophils Absolute: 107 cells/uL (ref 15–500)
HEMATOCRIT: 36.3 % — AB (ref 38.5–50.0)
Hemoglobin: 12.6 g/dL — ABNORMAL LOW (ref 13.2–17.1)
LYMPHS ABS: 1173 {cells}/uL (ref 850–3900)
MCH: 31.5 pg (ref 27.0–33.0)
MCHC: 34.7 g/dL (ref 32.0–36.0)
MCV: 90.8 fL (ref 80.0–100.0)
MPV: 11.1 fL (ref 7.5–12.5)
Monocytes Relative: 8.6 %
NEUTROS PCT: 65.9 %
Neutro Abs: 3361 cells/uL (ref 1500–7800)
PLATELETS: 186 10*3/uL (ref 140–400)
RBC: 4 10*6/uL — ABNORMAL LOW (ref 4.20–5.80)
RDW: 12.1 % (ref 11.0–15.0)
TOTAL LYMPHOCYTE: 23 %
WBC: 5.1 10*3/uL (ref 3.8–10.8)
WBCMIX: 439 {cells}/uL (ref 200–950)

## 2018-04-21 ENCOUNTER — Encounter: Payer: Self-pay | Admitting: Nurse Practitioner

## 2018-04-21 ENCOUNTER — Ambulatory Visit (INDEPENDENT_AMBULATORY_CARE_PROVIDER_SITE_OTHER): Payer: Medicare Other | Admitting: Nurse Practitioner

## 2018-04-21 VITALS — BP 120/66 | HR 64 | Temp 97.7°F | Ht 64.0 in | Wt 152.0 lb

## 2018-04-21 DIAGNOSIS — I10 Essential (primary) hypertension: Secondary | ICD-10-CM | POA: Diagnosis not present

## 2018-04-21 DIAGNOSIS — K5903 Drug induced constipation: Secondary | ICD-10-CM

## 2018-04-21 DIAGNOSIS — I25810 Atherosclerosis of coronary artery bypass graft(s) without angina pectoris: Secondary | ICD-10-CM | POA: Diagnosis not present

## 2018-04-21 DIAGNOSIS — N401 Enlarged prostate with lower urinary tract symptoms: Secondary | ICD-10-CM | POA: Diagnosis not present

## 2018-04-21 DIAGNOSIS — D649 Anemia, unspecified: Secondary | ICD-10-CM

## 2018-04-21 DIAGNOSIS — E785 Hyperlipidemia, unspecified: Secondary | ICD-10-CM

## 2018-04-21 DIAGNOSIS — M810 Age-related osteoporosis without current pathological fracture: Secondary | ICD-10-CM

## 2018-04-21 DIAGNOSIS — G4733 Obstructive sleep apnea (adult) (pediatric): Secondary | ICD-10-CM

## 2018-04-21 DIAGNOSIS — K219 Gastro-esophageal reflux disease without esophagitis: Secondary | ICD-10-CM | POA: Diagnosis not present

## 2018-04-21 NOTE — Progress Notes (Signed)
Careteam: Patient Care Team: [name], NP as PCP - General (Geriatric Medicine)  Advanced Directive information    Allergies  Allergen Reactions  . Compazine [Prochlorperazine Edisylate] Anxiety    Chief Complaint  Patient presents with  . Medical Management of Chronic Issues    6 month follo-up and discuss labs (copy printed)   . Immunizations    Plans to get 2nd Shingrix soon     HPI: Patient is a 82 y.o. male seen in the office today for routine follow up.   Hyperlipidemia- taking crestor, LDL at goal 47. "havent been eating very well"  GERD- uses protonix and zantac to help control symptoms.   BPH- following with urologist, s/p TURP and  on proscar and cardura, no increase in symptoms.   Followed by Dr Annamaria Boots for OSA wears CPAP and seasonal allergies   CAD/HTN- s/p cath in 2010 which led to CABG following with Cardiologist Dr Stanford Breed. No chest pains. Takes ASA daily and atenolol 50 mg daily   Osteopenia- last DEXA by [name] has been on fosamax for ~1.5 years, has also taken prolia.   Labs reviewed with pt Review of Systems:  Review of Systems  Constitutional: Negative for chills, fever and weight loss.  HENT: Negative for tinnitus.   Respiratory: Negative for cough, sputum production and shortness of breath.   Cardiovascular: Negative for chest pain, palpitations and leg swelling.  Gastrointestinal: Positive for heartburn (controlled on current regimen). Negative for abdominal pain, constipation and diarrhea.  Genitourinary: Negative for dysuria, frequency and urgency.  Musculoskeletal: Positive for back pain. Negative for falls, joint pain and myalgias.  Skin: Negative.   Neurological: Negative for dizziness and headaches.  Psychiatric/Behavioral: Negative for depression and memory loss. The patient does not have insomnia.     Past Medical History:  Diagnosis Date  . Acute gastric ulcer without mention of hemorrhage, perforation, or  obstruction   . Allergic rhinitis, cause unspecified   . Anemia, unspecified   . Arthritis   . Asthma   . Blood transfusion without reported diagnosis   . CAD (coronary artery disease)   . Diaphragmatic hernia without mention of obstruction or gangrene   . Elevated prostate specific antigen (PSA)   . Enlarged prostate   . Esophageal reflux   . Extrinsic asthma, unspecified   . Herpes zoster without mention of complication   . Hyperlipidemia   . Hypersomnia with sleep apnea, unspecified   . Impotence of organic origin   . Kyphosis   . Lumbago   . Obstructive sleep apnea (adult) (pediatric)   . Pneumonia    hx of years ago   . Senile osteoporosis   . Trigger finger (acquired)   . Tubular adenoma of colon 05/2013  . Unspecified essential hypertension   . Unspecified sleep apnea    CPAP- settings 4-12   . Unspecified vitamin D deficiency    Past Surgical History:  Procedure Laterality Date  . CARDIAC CATHETERIZATION  03/04/2009   Dr [name]  . COLONOSCOPY    . CORONARY ARTERY BYPASS GRAFT  2010  . CYSTOSCOPY WITH RETROGRADE PYELOGRAM, URETEROSCOPY AND STENT PLACEMENT Right 10/04/2013   Procedure: CYSTOSCOPY WITH RETROGRADE PYELOGRAM,  AND STENT PLACEMENT;  Surgeon: Festus Aloe, MD;  Location: [facility];  Service: Urology;  Laterality: Right;  . MOLE REMOVAL  1958   chin  . ROTATOR CUFF REPAIR Left 04/1998   with bone spur removed, Dr French Ana  . TONSILLECTOMY  1945  . TRANSURETHRAL  RESECTION OF PROSTATE N/A 09/24/2013   Procedure: TRANSURETHRAL RESECTION OF THE PROSTATE (TURP) WITH GYRUS (STAGED RIGHT LATERAL AND MEDIAN LOBE);  Surgeon: Ailene Rud, MD;  Location: [facility];  Service: Urology;  Laterality: N/A;  . UPPER GI ENDOSCOPY     Social History:   reports that he has never smoked. He has never used smokeless tobacco. He reports that he does not drink alcohol or use drugs.  Family History  Problem Relation Age of Onset  . Stroke Mother   . Breast cancer  Mother   . Cirrhosis Mother        wine  . Rheumatic fever Mother   . Kyphosis Mother   . Hypertension Father   . Stroke Father   . Heart disease Father   . Heart disease Paternal Grandfather   . Colon cancer Neg Hx     Medications: Patient's Medications  New Prescriptions   No medications on file  Previous Medications   ACETAMINOPHEN (TYLENOL) 500 MG TABLET    Take 1,000 mg by mouth every 6 (six) hours as needed for mild pain or moderate pain.   ALBUTEROL (PROVENTIL HFA;VENTOLIN HFA) 108 (90 BASE) MCG/ACT INHALER    Inhale 1-2 puffs into the lungs every 6 (six) hours as needed for wheezing or shortness of breath.   ALENDRONATE (FOSAMAX) 70 MG TABLET    Take 1 tablet by mouth once a week.   ASPIRIN EC 81 MG TABLET    Take 1 tablet (81 mg total) by mouth daily.   ATENOLOL (TENORMIN) 50 MG TABLET    TAKE 1 TABLET BY MOUTH EVERY DAY TO CONTROL HEART RHYTHM AND LOWER BLOOD PRESSURE   B COMPLEX VITAMINS TABLET    Take 1 tablet by mouth daily.   CALCIUM CITRATE 250 MG TABS    Take 250 tablets by mouth daily.    CHLORPHENIRAMINE-HYDROCODONE (TUSSIONEX PENNKINETIC ER) 10-8 MG/5ML SUER    5 cc every 12 hours if needed for cough   CHOLECALCIFEROL (VITAMIN D-3) 5000 UNITS TABS    Take 2 tablets by mouth daily.   COENZYME Q10 (COQ10) 200 MG CAPS    Take by mouth daily.   DOXAZOSIN (CARDURA) 4 MG TABLET    ONE DAILY TO HELP CONTROL BP   FEXOFENADINE (ALLEGRA) 180 MG TABLET    Take 180 mg by mouth as needed.    FINASTERIDE (PROSCAR) 5 MG TABLET    Take 5 mg by mouth every morning.   FOLIC ACID (FOLVITE) 1 MG TABLET    Take 1 tablet (1 mg total) by mouth daily.   METOCLOPRAMIDE (REGLAN) 10 MG TABLET    Take 0.5 tablets (5 mg total) by mouth every 6 (six) hours as needed for nausea (nausea/headache).   MULTIPLE VITAMIN (MULTIVITAMIN) TABLET    Take 1 tablet by mouth daily.     OMEGA-3 FATTY ACIDS (FISH OIL) 1000 MG CAPS    Take by mouth daily.   PANTOPRAZOLE (PROTONIX) 40 MG TABLET    Take 1 tablet  (40 mg total) by mouth daily.   PROBIOTIC PRODUCT (PROBIOTIC DAILY PO)    Take 2 capsules by mouth daily.    RANITIDINE (ZANTAC) 150 MG TABLET    Take 1 tablet (150 mg total) by mouth at bedtime.   ROSUVASTATIN (CRESTOR) 20 MG TABLET    TAKE ONE TABLET BY MOUTH ONCE DAILY FOR CHOLESTEROL   SAW PALMETTO 160 MG CAPS    Take by mouth.  Modified Medications   No medications  on file  Discontinued Medications   No medications on file     Physical Exam:  Vitals:   04/21/18 1352  BP: 120/66  Pulse: 64  Temp: 97.7 F (36.5 C)  TempSrc: Oral  SpO2: 95%  Weight: 152 lb (68.9 kg)  Height: 5\' 4"  (1.626 m)   Body mass index is 26.09 kg/m.  Physical Exam Constitutional:      General: He is not in acute distress.    Appearance: He is well-developed.  HENT:     Head: Normocephalic and atraumatic.     Nose: Nose normal.  Eyes:     Conjunctiva/sclera: Conjunctivae normal.     Pupils: Pupils are equal, round, and reactive to light.  Neck:     Musculoskeletal: Normal range of motion and neck supple.  Cardiovascular:     Rate and Rhythm: Normal rate and regular rhythm.     Heart sounds: Normal heart sounds.  Pulmonary:     Effort: Pulmonary effort is normal. No respiratory distress.     Breath sounds: Normal breath sounds.  Abdominal:     General: Bowel sounds are normal.     Palpations: Abdomen is soft.  Musculoskeletal: Normal range of motion.        General: No tenderness.  Skin:    General: Skin is warm and dry.     Coloration: Skin is not pale.     Findings: No erythema or rash.  Neurological:     Mental Status: He is alert and oriented to person, place, and time.     Cranial Nerves: No cranial nerve deficit.     Coordination: Coordination normal.     Deep Tendon Reflexes: Reflexes are normal and symmetric.  Psychiatric:        Behavior: Behavior normal.        Thought Content: Thought content normal.        Judgment: Judgment normal.     Labs reviewed: Basic  Metabolic Panel: Recent Labs    10/04/17 1007 04/18/18 1354  NA 138 135  K 4.2 4.4  CL 106 102  CO2 28 28  GLUCOSE 98 100*  BUN 11 16  CREATININE 0.96 0.95  CALCIUM 9.3 9.3   Liver Function Tests: Recent Labs    10/04/17 1007 04/18/18 1354  AST 21 21  ALT 15 15  BILITOT 1.4* 1.4*  PROT 6.2 6.7   No results for input(s): LIPASE, AMYLASE in the last 8760 hours. No results for input(s): AMMONIA in the last 8760 hours. CBC: Recent Labs    10/04/17 1007 04/18/18 1354  WBC 5.4 5.1  NEUTROABS 3,623 3,361  HGB 12.7* 12.6*  HCT 37.0* 36.3*  MCV 86.7 90.8  PLT 168 186   Lipid Panel: Recent Labs    10/04/17 1007  CHOL 118  HDL 55  LDLCALC 47  TRIG 78  CHOLHDL 2.1   TSH: No results for input(s): TSH in the last 8760 hours. A1C: Lab Results  Component Value Date   HGBA1C 5.6 07/28/2012     Assessment/Plan 1. Coronary artery disease involving coronary bypass graft of native heart without angina pectoris Stable without chest pains. Continue on ASA 81 mg   2. Essential hypertension -controlled on atenolol. To continue current medication, lifestyle modifications encouraged.   3. Obstructive sleep apnea Continues on CPAP  4. Gastroesophageal reflux disease, esophagitis presence not specified Stable on protonix with dietary modifications.   5. Senile osteoporosis Followed through orthopedist, continues on calcium and vit d with fosamax  6. Benign prostatic hyperplasia with lower urinary tract symptoms, symptom details unspecified Pt with hx of TURP, symptoms stable, will continue cardura and proscar  7. Dyslipidemia LDL at goal on last lab, continues on Crestor, encouraged dietary modifications.  - Lipid Panel; Future - CMP; Future  8. Anemia, unspecified type Stable.  - CBC with Differential/Platelets; Future  9. Drug-induced constipation From using tussinex due to cough, encourage to increase hydration and use stool softener daily   Next appt: 6  months with lab work before [employer]. [place], [facility] [phone]

## 2018-04-21 NOTE — Patient Instructions (Signed)
Colace 100 mg by mouth daily for stools.   Follow up 6 months, labs before  Constipation, Adult Constipation is when a person has fewer bowel movements in a week than normal, has difficulty having a bowel movement, or has stools that are dry, hard, or larger than normal. Constipation may be caused by an underlying condition. It may become worse with age if a person takes certain medicines and does not take in enough fluids. Follow these instructions at home: Eating and drinking   Eat foods that have a lot of fiber, such as fresh fruits and vegetables, whole grains, and beans.  Limit foods that are high in fat, low in fiber, or overly processed, such as french fries, hamburgers, cookies, candies, and soda.  Drink enough fluid to keep your urine clear or pale yellow. General instructions  Exercise regularly or as told by your health care provider.  Go to the restroom when you have the urge to go. Do not hold it in.  Take over-the-counter and prescription medicines only as told by your health care provider. These include any fiber supplements.  Practice pelvic floor retraining exercises, such as deep breathing while relaxing the lower abdomen and pelvic floor relaxation during bowel movements.  Watch your condition for any changes.  Keep all follow-up visits as told by your health care provider. This is important. Contact a health care provider if:  You have pain that gets worse.  You have a fever.  You do not have a bowel movement after 4 days.  You vomit.  You are not hungry.  You lose weight.  You are bleeding from the anus.  You have thin, pencil-like stools. Get help right away if:  You have a fever and your symptoms suddenly get worse.  You leak stool or have blood in your stool.  Your abdomen is bloated.  You have severe pain in your abdomen.  You feel dizzy or you faint. This information is not intended to replace advice given to you by your health care  provider. Make sure you discuss any questions you have with your health care provider. Document Released: 01/20/2004 Document Revised: 11/11/2015 Document Reviewed: 10/12/2015 Elsevier Interactive Patient Education  2018 [employer].

## 2018-04-22 ENCOUNTER — Encounter: Payer: Self-pay | Admitting: Nurse Practitioner

## 2018-04-22 NOTE — Telephone Encounter (Signed)
Routed to [name], NP

## 2018-04-23 ENCOUNTER — Other Ambulatory Visit: Payer: Self-pay | Admitting: Nurse Practitioner

## 2018-05-26 ENCOUNTER — Other Ambulatory Visit: Payer: Self-pay | Admitting: Nurse Practitioner

## 2018-05-26 DIAGNOSIS — I10 Essential (primary) hypertension: Secondary | ICD-10-CM

## 2018-06-11 ENCOUNTER — Other Ambulatory Visit: Payer: Self-pay | Admitting: Nurse Practitioner

## 2018-07-04 ENCOUNTER — Encounter: Payer: Self-pay | Admitting: Gastroenterology

## 2018-08-13 ENCOUNTER — Other Ambulatory Visit: Payer: Self-pay | Admitting: Sports Medicine

## 2018-08-13 DIAGNOSIS — M810 Age-related osteoporosis without current pathological fracture: Secondary | ICD-10-CM

## 2018-08-24 ENCOUNTER — Other Ambulatory Visit: Payer: Self-pay | Admitting: Internal Medicine

## 2018-08-29 ENCOUNTER — Ambulatory Visit (INDEPENDENT_AMBULATORY_CARE_PROVIDER_SITE_OTHER): Payer: Medicare Other | Admitting: Internal Medicine

## 2018-08-29 ENCOUNTER — Other Ambulatory Visit: Payer: Self-pay

## 2018-08-29 DIAGNOSIS — G4733 Obstructive sleep apnea (adult) (pediatric): Secondary | ICD-10-CM | POA: Diagnosis not present

## 2018-08-29 DIAGNOSIS — I2510 Atherosclerotic heart disease of native coronary artery without angina pectoris: Secondary | ICD-10-CM

## 2018-08-29 NOTE — Progress Notes (Signed)
Subjective:    Patient ID: Brent Griffith, male    DOB: [birth year], 83 y.o.   MRN: [id number]  HPI  male never smoker followed for OSA, periodic limb movement, complicated by CAD/CABG,   HBP, GERD,                 NPSG 12/15/04- AHI  9.2/ hr, desaturation to 76%, PLMA 13.6/ hr, body weight 170 lbs Heart surgery Nov 2010 CABG 4v PFT: 05/04/2014: Normal spirometry flows, insignificant response to bronchodilator, moderate diffusion defect 59% of predicted, possible air trapping ----------------------------------------------------------------------------------------------------------  08/28/2017- 83 year old male never smoker followed for OSA, periodic limb movement, complicated by CAD/CABG,    Wife here CPAP auto 6-12/Advanced ----FOLLOWS FOR: wearing cpap avg 5-6hr. feels pressure & mask are okay. DME:[facility] Has [facility] machine.  Ready for new supplies but cannot afford them yet. Download 93% compliance AHI 4.3/hour. Denies wheezing and has not needed his albuterol rescue inhaler.  Does like his spacer. Complains of postnasal drip and says Dymista nasal spray did not help.  No longer working with Dr. Donneta Romberg. CXR-01/28/2017 Chronic stable changes of reactive airway disease. Previous CABG. No pneumonia nor CHF. Thoracic aortic atherosclerosis.  Review of Systems- see HPI   + = positive Constitutional:   No-   weight loss, night sweats, fevers, chills, fatigue, lassitude. HEENT:   No-   headaches, difficulty swallowing, tooth/dental problems, sore throat,                  No-   sneezing, itching, ear ache, nasal congestion, +post nasal drip,  CV:  No-   chest pain, orthopnea, PND, swelling in lower extremities, anasarca,dizziness, palpitations GI:  No-   heartburn, indigestion, abdominal pain, nausea, vomiting,  Resp: +  shortness of breath with exertion or at rest.  No-  excess mucus,             No-   productive cough,   non-productive cough,  No-  coughing up of blood.              No-    change in color of mucus.  No- wheezing.   Skin: No-   rash or lesions. GU: . MS:  No-   joint pain or swelling. Marland Kitchen Psych:  No- change in mood or affect. No depression or anxiety.  No memory loss.    Objective:   General- Alert, Oriented, Affect-appropriate, Distress- none acute. Slender Skin- rash-none, lesions- none, excoriation- none Lymphadenopathy- none Head- atraumatic            Eyes- Gross vision intact, PERRLA, conjunctivae clear secretions            Ears- Hearing, canals normal            Nose- Clear, +Septal dev/ external nasal deviation,  No-mucus, polyps, erosion, perforation             Throat- Mallampati II-III , mucosa clear , drainage- none, tonsils- atrophic Neck- flexible , trachea midline, no stridor , thyroid nl, carotid no bruit Chest - symmetrical excursion , unlabored           Heart/CV- RRR , no murmur , no gallop  , no rub, nl s1 s2                           - JVD- none , edema- none, stasis changes- none, varices- none           Lung-  clear to P&A, wheeze- none, cough-none, dullness-none, rub- none           Chest wall-  Abd-  Br/ Gen/ Rectal- Not done, not indicated Extrem- cyanosis- none, clubbing, none, atrophy- none, strength- nl Neuro- grossly intact to observation     08/29/2018- Virtual Visit via Telephone Note  I connected with Brent Griffith on 08/29/18 at 10:30 AM EDT by telephone and verified that I am speaking with the correct person using two identifiers.   I discussed the limitations, risks, security and privacy concerns of performing an evaluation and management service by telephone and the availability of in person appointments. I also discussed with the patient that there may be a patient responsible charge related to this service. The patient expressed understanding and agreed to proceed.   History of Present Illness:  83 yo male never smoker followed for OSA, periodic limb movement, complicated by CAD/CABG,  HBP, GERD,    CPAP auto  6-12/Advanced Download 93% compliance, AHI 4.4/ hr He reports good compliance and no concerns, using CPAP every night and sleeping better with it. No new sleep problems and no acute medical issues since last visit.  He has albuterol rescue inhaler but rarely using it.    Observations/Objective: NPSG 12/15/04- AHI  9.2/ hr, desaturation to 76%, PLMA 13.6/ hr, body weight 170 lbs Heart surgery Nov 2010 CABG 4v PFT: 05/04/2014: Normal spirometry flows, insignificant response to bronchodilator, moderate diffusion defect 59% of predicted, possible air trapping CPAP auto 6-12/Advanced  Assessment and Plan: OSA- continues to benefit CAD- denies recent cardiac problems.   Follow Up Instructions: 1 year   I discussed the assessment and treatment plan with the patient. The patient was provided an opportunity to ask questions and all were answered. The patient agreed with the plan and demonstrated an understanding of the instructions.   The patient was advised to call back or seek an in-person evaluation if the symptoms worsen or if the condition fails to improve as anticipated.  I provided **18 minutes of non-face-to-face time during this encounter.   Baird Lyons, MD   Assessment & Plan:

## 2018-08-29 NOTE — Patient Instructions (Signed)
We can continue CPAP auto 6-12/ [facility], mask of choice, humidifier, supplies, AirView/ card  Please call if we can help

## 2018-09-01 ENCOUNTER — Encounter: Payer: Self-pay | Admitting: Internal Medicine

## 2018-09-01 NOTE — Assessment & Plan Note (Signed)
He feels comfortable with his care, reporting no recent exacerbation or events of concern.

## 2018-09-01 NOTE — Assessment & Plan Note (Signed)
He continues to benefit from CPAP with good sleep quality and good reported compliance.  Plan- Continue auto 6-12

## 2018-09-04 ENCOUNTER — Telehealth: Payer: Self-pay | Admitting: *Deleted

## 2018-09-04 ENCOUNTER — Other Ambulatory Visit: Payer: Self-pay | Admitting: *Deleted

## 2018-09-04 MED ORDER — FAMOTIDINE 20 MG PO TABS: 20.0000 mg | ORAL_TABLET | Freq: Every day | ORAL | 3 refills | Status: DC

## 2018-09-04 NOTE — Telephone Encounter (Signed)
Sent prescription to pharmacy per Dr. Silvio Pate orders. Received from wife's MyChart account.

## 2018-09-04 NOTE — Telephone Encounter (Signed)
Famotidine 20 mg po hs, 1 year of refills

## 2018-09-04 NOTE — Telephone Encounter (Signed)
Dr. Fuller Plan, this patients wife sent a message through [facility] because she wants a prescription to replace Zantac, as the pharmacy no longer sells it. The pharmacy is [facility] [address] in [facility]. Please advise.

## 2018-09-08 ENCOUNTER — Other Ambulatory Visit: Payer: Self-pay | Admitting: Nurse Practitioner

## 2018-09-08 DIAGNOSIS — D649 Anemia, unspecified: Secondary | ICD-10-CM

## 2018-09-08 DIAGNOSIS — D561 Beta thalassemia: Secondary | ICD-10-CM

## 2018-09-26 ENCOUNTER — Other Ambulatory Visit: Payer: Self-pay | Admitting: Nurse Practitioner

## 2018-10-13 ENCOUNTER — Other Ambulatory Visit: Payer: Medicare Other

## 2018-10-13 ENCOUNTER — Other Ambulatory Visit: Payer: Medicare Other | Admitting: Nurse Practitioner

## 2018-10-13 ENCOUNTER — Other Ambulatory Visit: Payer: Self-pay

## 2018-10-13 DIAGNOSIS — D649 Anemia, unspecified: Secondary | ICD-10-CM

## 2018-10-13 DIAGNOSIS — E785 Hyperlipidemia, unspecified: Secondary | ICD-10-CM

## 2018-10-13 LAB — COMPREHENSIVE METABOLIC PANEL
AG Ratio: 1.8 (calc) (ref 1.0–2.5)
ALT: 14 U/L (ref 9–46)
AST: 19 U/L (ref 10–35)
Albumin: 4.1 g/dL (ref 3.6–5.1)
Alkaline phosphatase (APISO): 35 U/L (ref 35–144)
BUN: 20 mg/dL (ref 7–25)
CO2: 27 mmol/L (ref 20–32)
Calcium: 9.3 mg/dL (ref 8.6–10.3)
Chloride: 104 mmol/L (ref 98–110)
Creat: 0.98 mg/dL (ref 0.70–1.11)
Globulin: 2.3 g/dL (calc) (ref 1.9–3.7)
Glucose, Bld: 93 mg/dL (ref 65–99)
Potassium: 4 mmol/L (ref 3.5–5.3)
Sodium: 137 mmol/L (ref 135–146)
Total Bilirubin: 1.3 mg/dL — ABNORMAL HIGH (ref 0.2–1.2)
Total Protein: 6.4 g/dL (ref 6.1–8.1)

## 2018-10-13 LAB — LIPID PANEL
Cholesterol: 117 mg/dL (ref <200)
HDL: 55 mg/dL (ref >=40)
LDL Cholesterol (Calc): 48 mg/dL (calc)
Non-HDL Cholesterol (Calc): 62 mg/dL (calc) (ref <130)
Total CHOL/HDL Ratio: 2.1 (calc) (ref <5.0)
Triglycerides: 55 mg/dL (ref <150)

## 2018-10-13 LAB — CBC WITH DIFFERENTIAL/PLATELET
Absolute Monocytes: 481 cells/uL (ref 200–950)
Basophils Absolute: 29 cells/uL (ref 0–200)
Basophils Relative: 0.5 %
Eosinophils Absolute: 151 cells/uL (ref 15–500)
Eosinophils Relative: 2.6 %
HCT: 37.4 % — ABNORMAL LOW (ref 38.5–50.0)
Hemoglobin: 12.8 g/dL — ABNORMAL LOW (ref 13.2–17.1)
Lymphs Abs: 1670 cells/uL (ref 850–3900)
MCH: 30.7 pg (ref 27.0–33.0)
MCHC: 34.2 g/dL (ref 32.0–36.0)
MCV: 89.7 fL (ref 80.0–100.0)
MPV: 11.2 fL (ref 7.5–12.5)
Monocytes Relative: 8.3 %
Neutro Abs: 3468 cells/uL (ref 1500–7800)
Neutrophils Relative %: 59.8 %
Platelets: 169 10*3/uL (ref 140–400)
RBC: 4.17 10*6/uL — ABNORMAL LOW (ref 4.20–5.80)
RDW: 12.1 % (ref 11.0–15.0)
Total Lymphocyte: 28.8 %
WBC: 5.8 10*3/uL (ref 3.8–10.8)

## 2018-10-21 ENCOUNTER — Other Ambulatory Visit: Payer: Self-pay

## 2018-10-21 ENCOUNTER — Encounter: Payer: Self-pay | Admitting: Nurse Practitioner

## 2018-10-21 ENCOUNTER — Ambulatory Visit (INDEPENDENT_AMBULATORY_CARE_PROVIDER_SITE_OTHER): Payer: Medicare Other | Admitting: Nurse Practitioner

## 2018-10-21 VITALS — BP 130/70 | HR 52 | Temp 97.5°F | Resp 20 | Ht 64.0 in | Wt 145.4 lb

## 2018-10-21 DIAGNOSIS — G4733 Obstructive sleep apnea (adult) (pediatric): Secondary | ICD-10-CM

## 2018-10-21 DIAGNOSIS — M542 Cervicalgia: Secondary | ICD-10-CM

## 2018-10-21 DIAGNOSIS — I10 Essential (primary) hypertension: Secondary | ICD-10-CM

## 2018-10-21 DIAGNOSIS — E785 Hyperlipidemia, unspecified: Secondary | ICD-10-CM

## 2018-10-21 DIAGNOSIS — I25810 Atherosclerosis of coronary artery bypass graft(s) without angina pectoris: Secondary | ICD-10-CM

## 2018-10-21 DIAGNOSIS — N401 Enlarged prostate with lower urinary tract symptoms: Secondary | ICD-10-CM

## 2018-10-21 DIAGNOSIS — K219 Gastro-esophageal reflux disease without esophagitis: Secondary | ICD-10-CM | POA: Diagnosis not present

## 2018-10-21 DIAGNOSIS — D508 Other iron deficiency anemias: Secondary | ICD-10-CM

## 2018-10-21 NOTE — Progress Notes (Signed)
Careteam: Patient Care Team: [name], NP as PCP - General (Geriatric Medicine) [name], MD as Consulting Physician (Sports Medicine)  Advanced Directive information    Allergies  Allergen Reactions   Compazine [Prochlorperazine Edisylate] Anxiety    Chief Complaint  Patient presents with   Medical Management of Chronic Issues    6 Month Follow Up     HPI: Patient is a 83 y.o. male seen in the office today for routine follow up.   Hyperlipidemia- taking crestor, LDL at goal 48. "havent been eating very well"  GERD- uses protonix (and another pill unsure of the name) to help control symptoms.   BPH- following with urologist, s/p TURP and on proscar and cardura, no increase in symptoms, drinks a lot in the evening but this is not new.   OSA wears CPAP and seasonal allergies- follows by Dr Annamaria Boots.  CAD/HTN- s/p cath in 2010 which led to CABG following with Cardiologist Dr Stanford Breed. No chest pains. Takes ASA daily and atenolol 50 mg daily   Osteopenia- last DEXA by [name], following next month. Continues on fosamax. Review of Systems:  Review of Systems  Constitutional: Negative for chills, fever and weight loss.  HENT: Negative for tinnitus.   Respiratory: Negative for cough, sputum production and shortness of breath.   Cardiovascular: Negative for chest pain, palpitations and leg swelling.  Gastrointestinal: Positive for heartburn (controlled on current regimen). Negative for abdominal pain, constipation and diarrhea.  Genitourinary: Negative for dysuria, frequency and urgency.  Musculoskeletal: Positive for back pain. Negative for falls, joint pain and myalgias.  Skin: Negative.   Neurological: Negative for dizziness and headaches.  Psychiatric/Behavioral: Negative for depression and memory loss. The patient does not have insomnia.     Past Medical History:  Diagnosis Date   Acute gastric ulcer without mention of hemorrhage,  perforation, or obstruction    Allergic rhinitis, cause unspecified    Anemia, unspecified    Arthritis    Asthma    Blood transfusion without reported diagnosis    CAD (coronary artery disease)    Diaphragmatic hernia without mention of obstruction or gangrene    Elevated prostate specific antigen (PSA)    Enlarged prostate    Esophageal reflux    Extrinsic asthma, unspecified    Herpes zoster without mention of complication    Hyperlipidemia    Hypersomnia with sleep apnea, unspecified    Impotence of organic origin    Kyphosis    Lumbago    Obstructive sleep apnea (adult) (pediatric)    Pneumonia    hx of years ago    Senile osteoporosis    Trigger finger (acquired)    Tubular adenoma of colon 05/2013   Unspecified essential hypertension    Unspecified sleep apnea    CPAP- settings 4-12    Unspecified vitamin D deficiency    Past Surgical History:  Procedure Laterality Date   CARDIAC CATHETERIZATION  03/04/2009   Dr [name]   COLONOSCOPY     CORONARY ARTERY BYPASS GRAFT  2010   CYSTOSCOPY WITH RETROGRADE PYELOGRAM, URETEROSCOPY AND STENT PLACEMENT Right 10/04/2013   Procedure: CYSTOSCOPY WITH RETROGRADE PYELOGRAM,  AND STENT PLACEMENT;  Surgeon: Festus Aloe, MD;  Location: [facility];  Service: Urology;  Laterality: Right;   MOLE REMOVAL  1958   chin   ROTATOR CUFF REPAIR Left 04/1998   with bone spur removed, Dr French Ana   TONSILLECTOMY  1945   TRANSURETHRAL RESECTION OF PROSTATE N/A 09/24/2013  Procedure: TRANSURETHRAL RESECTION OF THE PROSTATE (TURP) WITH GYRUS (STAGED RIGHT LATERAL AND MEDIAN LOBE);  Surgeon: Ailene Rud, MD;  Location: [facility];  Service: Urology;  Laterality: N/A;   UPPER GI ENDOSCOPY     Social History:   reports that he has never smoked. He has never used smokeless tobacco. He reports that he does not drink alcohol or use drugs.  Family History  Problem Relation Age of Onset   Stroke Mother      Breast cancer Mother    Cirrhosis Mother        wine   Rheumatic fever Mother    Kyphosis Mother    Hypertension Father    Stroke Father    Heart disease Father    Heart disease Paternal Grandfather    Colon cancer Neg Hx     Medications: Patient's Medications  New Prescriptions   No medications on file  Previous Medications   ACETAMINOPHEN (TYLENOL) 500 MG TABLET    Take 1,000 mg by mouth every 6 (six) hours as needed for mild pain or moderate pain.   ALBUTEROL (PROVENTIL HFA;VENTOLIN HFA) 108 (90 BASE) MCG/ACT INHALER    Inhale 1-2 puffs into the lungs every 6 (six) hours as needed for wheezing or shortness of breath.   ALENDRONATE (FOSAMAX) 70 MG TABLET    Take 1 tablet by mouth once a week.   ASPIRIN EC 81 MG TABLET    Take 1 tablet (81 mg total) by mouth daily.   ATENOLOL (TENORMIN) 50 MG TABLET    TAKE 1 TABLET BY MOUTH EVERY DAY TO CONTROL HEART RHYTHM AND LOWER BLOOD PRESSURE   B COMPLEX VITAMINS TABLET    Take 1 tablet by mouth daily.   CALCIUM CITRATE-VITAMIN D (EQ CALCIUM CITRATE+D3 PO)    Take 1,200 mg by mouth daily.   CHOLECALCIFEROL (VITAMIN D-3) 5000 UNITS TABS    Take 2 tablets by mouth daily.   COENZYME Q10 (COQ10) 200 MG CAPS    Take by mouth daily.   DOXAZOSIN (CARDURA) 4 MG TABLET    TAKE 1 TABLET DAILY TO HELP CONTROL BLOOD PRESSURE   FEXOFENADINE (ALLEGRA) 180 MG TABLET    Take 180 mg by mouth as needed.    FINASTERIDE (PROSCAR) 5 MG TABLET    Take 5 mg by mouth every morning.   FOLIC ACID (FOLVITE) 1 MG TABLET    TAKE 1 TABLET BY MOUTH EVERY DAY   MULTIPLE VITAMIN (MULTIVITAMIN) TABLET    Take 1 tablet by mouth daily.     OMEGA-3 FATTY ACIDS (FISH OIL) 1000 MG CAPS    Take by mouth daily.   PANTOPRAZOLE (PROTONIX) 40 MG TABLET    Take 1 tablet (40 mg total) by mouth daily.   PROBIOTIC PRODUCT (PROBIOTIC DAILY PO)    Take 2 capsules by mouth daily.    ROSUVASTATIN (CRESTOR) 20 MG TABLET    TAKE ONE TABLET BY MOUTH ONCE DAILY FOR CHOLESTEROL   SAW  PALMETTO 160 MG CAPS    Take by mouth.  Modified Medications   No medications on file  Discontinued Medications   CALCIUM CITRATE 250 MG TABS    Take 250 mg by mouth daily.    FAMOTIDINE (PEPCID) 20 MG TABLET    Take 1 tablet (20 mg total) by mouth at bedtime.   RANITIDINE (ZANTAC) 150 MG TABLET    Take 1 tablet (150 mg total) by mouth at bedtime.    Physical Exam:  Vitals:   10/21/18  1508  BP: 130/70  Pulse: (!) 52  Resp: 20  Temp: (!) 97.5 F (36.4 C)  TempSrc: Oral  SpO2: 97%  Weight: 145 lb 6.4 oz (66 kg)  Height: 5\' 4"  (1.626 m)   Body mass index is 24.96 kg/m. Wt Readings from Last 3 Encounters:  10/21/18 145 lb 6.4 oz (66 kg)  04/21/18 152 lb (68.9 kg)  03/05/18 146 lb 6.4 oz (66.4 kg)    Physical Exam Constitutional:      General: He is not in acute distress.    Appearance: He is well-developed.  HENT:     Head: Normocephalic and atraumatic.     Nose: Nose normal.  Eyes:     Conjunctiva/sclera: Conjunctivae normal.     Pupils: Pupils are equal, round, and reactive to light.  Neck:     Musculoskeletal: Normal range of motion and neck supple.  Cardiovascular:     Rate and Rhythm: Normal rate and regular rhythm.     Heart sounds: Normal heart sounds.  Pulmonary:     Effort: Pulmonary effort is normal. No respiratory distress.     Breath sounds: Normal breath sounds.  Abdominal:     General: Bowel sounds are normal.     Palpations: Abdomen is soft.  Musculoskeletal: Normal range of motion.        General: No tenderness.  Skin:    General: Skin is warm and dry.     Coloration: Skin is not pale.     Findings: No erythema or rash.  Neurological:     Mental Status: He is alert and oriented to person, place, and time.     Cranial Nerves: No cranial nerve deficit.     Coordination: Coordination normal.     Deep Tendon Reflexes: Reflexes are normal and symmetric.  Psychiatric:        Behavior: Behavior normal.        Thought Content: Thought content  normal.        Judgment: Judgment normal.     Labs reviewed: Basic Metabolic Panel: Recent Labs    04/18/18 1354 10/13/18 1006  NA 135 137  K 4.4 4.0  CL 102 104  CO2 28 27  GLUCOSE 100* 93  BUN 16 20  CREATININE 0.95 0.98  CALCIUM 9.3 9.3   Liver Function Tests: Recent Labs    04/18/18 1354 10/13/18 1006  AST 21 19  ALT 15 14  BILITOT 1.4* 1.3*  PROT 6.7 6.4   No results for input(s): LIPASE, AMYLASE in the last 8760 hours. No results for input(s): AMMONIA in the last 8760 hours. CBC: Recent Labs    04/18/18 1354 10/13/18 1006  WBC 5.1 5.8  NEUTROABS 3,361 3,468  HGB 12.6* 12.8*  HCT 36.3* 37.4*  MCV 90.8 89.7  PLT 186 169   Lipid Panel: Recent Labs    10/13/18 1006  CHOL 117  HDL 55  LDLCALC 48  TRIG 55  CHOLHDL 2.1   TSH: No results for input(s): TSH in the last 8760 hours. A1C: Lab Results  Component Value Date   HGBA1C 5.6 07/28/2012     Assessment/Plan 1. Gastroesophageal reflux disease, esophagitis presence not specified Controlled, ongoing follow up with Gastroenterology.  Continue protonix.   2. Obstructive sleep apnea Stable, continues on CPAP.  3. Coronary artery disease involving coronary bypass graft of native heart without angina pectoris -stable, continues on ASA, without chest pain.    4. Essential hypertension -stable, continues on atenolol and DASH diet.  5. Benign prostatic hyperplasia with lower urinary tract symptoms, symptom details unspecified Stable, ongoing follow up with urologist. Continues on proscar and cardura   6. Iron deficiency anemia secondary to inadequate dietary iron intake hgb stable at this time.   7. Dyslipidemia LDL at goal, continues on crestor 20 mg daily   8. Cervical spine pain Hx of fracture in cervical spine, followed by orthopedic, controlled on tylenol and ASA   Next appt: 04/15/2019 Janett Billow K. [place], [facility] [phone]

## 2018-10-26 ENCOUNTER — Emergency Department (HOSPITAL_BASED_OUTPATIENT_CLINIC_OR_DEPARTMENT_OTHER)
Admission: EM | Admit: 2018-10-26 | Discharge: 2018-10-26 | Disposition: A | Discharge status: Home / Self Care | Payer: Medicare Other | Attending: Emergency Medicine | Admitting: Emergency Medicine

## 2018-10-26 ENCOUNTER — Encounter (HOSPITAL_BASED_OUTPATIENT_CLINIC_OR_DEPARTMENT_OTHER): Payer: Self-pay | Admitting: Emergency Medicine

## 2018-10-26 ENCOUNTER — Other Ambulatory Visit: Payer: Self-pay

## 2018-10-26 DIAGNOSIS — I493 Ventricular premature depolarization: Secondary | ICD-10-CM | POA: Diagnosis not present

## 2018-10-26 DIAGNOSIS — I2510 Atherosclerotic heart disease of native coronary artery without angina pectoris: Secondary | ICD-10-CM | POA: Diagnosis not present

## 2018-10-26 DIAGNOSIS — Z7982 Long term (current) use of aspirin: Secondary | ICD-10-CM | POA: Insufficient documentation

## 2018-10-26 DIAGNOSIS — J45909 Unspecified asthma, uncomplicated: Secondary | ICD-10-CM | POA: Diagnosis not present

## 2018-10-26 DIAGNOSIS — R1013 Epigastric pain: Secondary | ICD-10-CM

## 2018-10-26 DIAGNOSIS — I10 Essential (primary) hypertension: Secondary | ICD-10-CM | POA: Diagnosis not present

## 2018-10-26 DIAGNOSIS — Z951 Presence of aortocoronary bypass graft: Secondary | ICD-10-CM | POA: Insufficient documentation

## 2018-10-26 DIAGNOSIS — Z79899 Other long term (current) drug therapy: Secondary | ICD-10-CM | POA: Diagnosis not present

## 2018-10-26 LAB — COMPREHENSIVE METABOLIC PANEL
ALT: 27 U/L (ref 0–44)
AST: 32 U/L (ref 15–41)
Albumin: 4 g/dL (ref 3.5–5.0)
Alkaline Phosphatase: 34 U/L — ABNORMAL LOW (ref 38–126)
Anion gap: 8 (ref 5–15)
BUN: 23 mg/dL (ref 8–23)
CO2: 24 mmol/L (ref 22–32)
Calcium: 9.4 mg/dL (ref 8.9–10.3)
Chloride: 103 mmol/L (ref 98–111)
Creatinine, Ser: 0.88 mg/dL (ref 0.61–1.24)
GFR calc Af Amer: >60 mL/min (ref >60)
GFR calc non Af Amer: >60 mL/min (ref >60)
Glucose, Bld: 116 mg/dL — ABNORMAL HIGH (ref 70–99)
Potassium: 4.3 mmol/L (ref 3.5–5.1)
Sodium: 135 mmol/L (ref 135–145)
Total Bilirubin: 1.1 mg/dL (ref 0.3–1.2)
Total Protein: 6.8 g/dL (ref 6.5–8.1)

## 2018-10-26 LAB — CBC WITH DIFFERENTIAL/PLATELET
Abs Immature Granulocytes: 0.02 10*3/uL (ref 0.00–0.07)
Basophils Absolute: 0 10*3/uL (ref 0.0–0.1)
Basophils Relative: 0 %
Eosinophils Absolute: 0.1 10*3/uL (ref 0.0–0.5)
Eosinophils Relative: 1 %
HCT: 36 % — ABNORMAL LOW (ref 39.0–52.0)
Hemoglobin: 12.3 g/dL — ABNORMAL LOW (ref 13.0–17.0)
Immature Granulocytes: 0 %
Lymphocytes Relative: 16 %
Lymphs Abs: 1.1 10*3/uL (ref 0.7–4.0)
MCH: 30.6 pg (ref 26.0–34.0)
MCHC: 34.2 g/dL (ref 30.0–36.0)
MCV: 89.6 fL (ref 80.0–100.0)
Monocytes Absolute: 0.5 10*3/uL (ref 0.1–1.0)
Monocytes Relative: 7 %
Neutro Abs: 5 10*3/uL (ref 1.7–7.7)
Neutrophils Relative %: 76 %
Platelets: 161 10*3/uL (ref 150–400)
RBC: 4.02 MIL/uL — ABNORMAL LOW (ref 4.22–5.81)
RDW: 11.9 % (ref 11.5–15.5)
WBC: 6.8 10*3/uL (ref 4.0–10.5)
nRBC: 0 % (ref 0.0–0.2)

## 2018-10-26 LAB — LIPASE, BLOOD: Lipase: 47 U/L (ref 11–51)

## 2018-10-26 LAB — TROPONIN I: Troponin I: <0.03 ng/mL (ref <0.03)

## 2018-10-26 MED ORDER — SUCRALFATE 1 GM/10ML PO SUSP
1.0000 g | Freq: Once | ORAL | Status: AC
  Administered 2018-10-26: 1 g via ORAL
  Filled 2018-10-26: qty 10

## 2018-10-26 MED ORDER — PANTOPRAZOLE SODIUM 40 MG IV SOLR
40.0000 mg | Freq: Once | INTRAVENOUS | Status: AC
  Administered 2018-10-26: 40 mg via INTRAVENOUS
  Filled 2018-10-26: qty 40

## 2018-10-26 NOTE — ED Triage Notes (Signed)
Pt states that he has been having epigastric discomfort for a while. He took his blood pressure and it was 170s/80s. Also noticed that his pulse was low in the 30s and 40s, not normal for him.

## 2018-10-26 NOTE — ED Provider Notes (Signed)
[facility] Provider Note: Georgena Spurling, MD, FACEP  CSN: [id number] MRN: [id number] ARRIVAL: 10/26/18 at [place]: [place]  Abdominal Pain   HISTORY OF PRESENT ILLNESS  10/26/18 3:36 AM Brent Griffith is a 83 y.o. male with a history of coronary artery disease.  He is here with epigastric discomfort that has been off and on since yesterday.  Nothing makes the symptoms better or worse.  He rates the discomfort is about a 2 or 3.  He describes the discomfort as feeling like he needs to take "a good belch".  He denies associated chest pain, shortness of breath, lightheadedness, diaphoresis, nausea or vomiting.  He has moved his bowels which has transiently improved the symptoms.  He took his blood pressure prior to arrival and he found it was in the 170s over the 80s and noted his pulse was to be in the 30s and 40s.  On arrival here he was noted to be in ventricular bigeminy which may have been misinterpreted as bradycardia since he was not lightheaded.   Past Medical History:  Diagnosis Date  . Acute gastric ulcer without mention of hemorrhage, perforation, or obstruction   . Allergic rhinitis, cause unspecified   . Anemia, unspecified   . Arthritis   . Asthma   . Blood transfusion without reported diagnosis   . CAD (coronary artery disease)   . Diaphragmatic hernia without mention of obstruction or gangrene   . Elevated prostate specific antigen (PSA)   . Enlarged prostate   . Esophageal reflux   . Extrinsic asthma, unspecified   . Herpes zoster without mention of complication   . Hyperlipidemia   . Hypersomnia with sleep apnea, unspecified   . Impotence of organic origin   . Kyphosis   . Lumbago   . Obstructive sleep apnea (adult) (pediatric)   . Pneumonia    hx of years ago   . Senile osteoporosis   . Trigger finger (acquired)   . Tubular adenoma of colon 05/2013  . Unspecified essential hypertension   . Unspecified sleep apnea    CPAP-  settings 4-12   . Unspecified vitamin D deficiency     Past Surgical History:  Procedure Laterality Date  . CARDIAC CATHETERIZATION  03/04/2009   Dr [name]  . COLONOSCOPY    . CORONARY ARTERY BYPASS GRAFT  2010  . CYSTOSCOPY WITH RETROGRADE PYELOGRAM, URETEROSCOPY AND STENT PLACEMENT Right 10/04/2013   Procedure: CYSTOSCOPY WITH RETROGRADE PYELOGRAM,  AND STENT PLACEMENT;  Surgeon: Festus Aloe, MD;  Location: [facility];  Service: Urology;  Laterality: Right;  . MOLE REMOVAL  1958   chin  . ROTATOR CUFF REPAIR Left 04/1998   with bone spur removed, Dr French Ana  . TONSILLECTOMY  1945  . TRANSURETHRAL RESECTION OF PROSTATE N/A 09/24/2013   Procedure: TRANSURETHRAL RESECTION OF THE PROSTATE (TURP) WITH GYRUS (STAGED RIGHT LATERAL AND MEDIAN LOBE);  Surgeon: Ailene Rud, MD;  Location: [facility];  Service: Urology;  Laterality: N/A;  . UPPER GI ENDOSCOPY      Family History  Problem Relation Age of Onset  . Stroke Mother   . Breast cancer Mother   . Cirrhosis Mother        wine  . Rheumatic fever Mother   . Kyphosis Mother   . Hypertension Father   . Stroke Father   . Heart disease Father   . Heart disease Paternal Grandfather   . Colon cancer Neg Hx  Social History   Tobacco Use  . Smoking status: Never Smoker  . Smokeless tobacco: Never Used  Substance Use Topics  . Alcohol use: No  . Drug use: No    Prior to Admission medications   Medication Sig Start Date End Date Taking? Authorizing Provider  acetaminophen (TYLENOL) 500 MG tablet Take 1,000 mg by mouth every 6 (six) hours as needed for mild pain or moderate pain.    [provider]  albuterol (PROVENTIL HFA;VENTOLIN HFA) 108 (90 Base) MCG/ACT inhaler Inhale 1-2 puffs into the lungs every 6 (six) hours as needed for wheezing or shortness of breath. 05/08/17   [name], NP  alendronate (FOSAMAX) 70 MG tablet Take 1 tablet by mouth once a week. 06/23/15   [provider]  aspirin  EC 81 MG tablet Take 1 tablet (81 mg total) by mouth daily. 03/05/18   Lelon Perla, MD  atenolol (TENORMIN) 50 MG tablet TAKE 1 TABLET BY MOUTH EVERY DAY TO CONTROL HEART RHYTHM AND LOWER BLOOD PRESSURE 09/26/18   [name], NP  b complex vitamins tablet Take 1 tablet by mouth daily.    [provider]  Calcium Citrate-Vitamin D (EQ CALCIUM CITRATE+D3 PO) Take 1,200 mg by mouth daily.    [provider]  Cholecalciferol (VITAMIN D-3) 5000 units TABS Take 2 tablets by mouth daily.    [provider]  Coenzyme Q10 (COQ10) 200 MG CAPS Take by mouth daily.    [provider]  doxazosin (CARDURA) 4 MG tablet TAKE 1 TABLET DAILY TO HELP CONTROL BLOOD PRESSURE 05/26/18   [name], NP  famotidine (PEPCID) 20 MG tablet Take 20 mg by mouth at bedtime.    [provider]  fexofenadine (ALLEGRA) 180 MG tablet Take 180 mg by mouth as needed.     [provider]  finasteride (PROSCAR) 5 MG tablet Take 5 mg by mouth every morning.    [provider]  folic acid (FOLVITE) 1 MG tablet TAKE 1 TABLET BY MOUTH EVERY DAY 09/08/18   [name], NP  Multiple Vitamin (MULTIVITAMIN) tablet Take 1 tablet by mouth daily.      [provider]  Omega-3 Fatty Acids (FISH OIL) 1000 MG CAPS Take by mouth daily.    [provider]  pantoprazole (PROTONIX) 40 MG tablet Take 1 tablet (40 mg total) by mouth daily. 01/10/18   Ladene Artist, MD  Probiotic Product (PROBIOTIC DAILY PO) Take 2 capsules by mouth daily.     [provider]  rosuvastatin (CRESTOR) 20 MG tablet TAKE ONE TABLET BY MOUTH ONCE DAILY FOR CHOLESTEROL 08/25/18   [name], NP  Saw Palmetto 160 MG CAPS Take by mouth.    [provider]    Allergies Compazine [prochlorperazine edisylate]   REVIEW OF SYSTEMS  Negative except as noted here or in the History of Present Illness.   PHYSICAL EXAMINATION  Initial Vital Signs  Blood pressure (!) 162/98, pulse (!) 39, temperature 98.4 F (36.9 C), temperature source Oral, resp. rate 18, height 5\' 4"  (1.626 m), weight 63.5 kg, SpO2 98 %.  Examination General: Well-developed, well-nourished male in no acute distress; appearance consistent with age of record HENT: normocephalic; atraumatic Eyes: pupils equal, round and reactive to light; extraocular muscles intact Neck: supple Heart: regular rate and rhythm; occasional ectopy but no longer in bigeminy Lungs: clear to auscultation bilaterally Abdomen: soft; nondistended; nontender; no masses or hepatosplenomegaly; bowel sounds present Extremities: No  deformity; full range of motion; pulses normal Neurologic: Awake, alert and oriented; motor function intact in all extremities and symmetric; no facial droop Skin: Warm and dry Psychiatric: Normal mood and affect   RESULTS  Summary of this visit's results, reviewed by myself:   EKG Interpretation  Date/Time:  Sunday October 26 2018 03:31:43 EDT Ventricular Rate:  89 PR Interval:    QRS Duration: 119 QT Interval:  356 QTC Calculation: 350 R Axis:   -4 Text Interpretation:  Sinus rhythm Ventricular bigeminy Incomplete left bundle branch block Ectopy not seen previously  Confirmed by Keera Altidor, Jenny Reichmann [phone]) on 10/26/2018 3:36:20 AM      Laboratory Studies: Results for orders placed or performed during the hospital encounter of 10/26/18 (from the past 24 hour(s))  Troponin I - ONCE - STAT     Status: None   Collection Time: 10/26/18  4:38 AM  Result Value Ref Range   Troponin I <0.03 <0.03 ng/mL  CBC with Differential/Platelet     Status: Abnormal   Collection Time: 10/26/18  4:38 AM  Result Value Ref Range   WBC 6.8 4.0 - 10.5 K/uL   RBC 4.02 (L) 4.22 - 5.81 MIL/uL   Hemoglobin 12.3 (L) 13.0 - 17.0 g/dL   HCT 36.0 (L) 39.0 - 52.0 %   MCV 89.6 80.0 - 100.0 fL   MCH 30.6 26.0 - 34.0 pg   MCHC 34.2 30.0 - 36.0 g/dL   RDW 11.9 11.5 - 15.5 %   Platelets 161 150  - 400 K/uL   nRBC 0.0 0.0 - 0.2 %   Neutrophils Relative % 76 %   Neutro Abs 5.0 1.7 - 7.7 K/uL   Lymphocytes Relative 16 %   Lymphs Abs 1.1 0.7 - 4.0 K/uL   Monocytes Relative 7 %   Monocytes Absolute 0.5 0.1 - 1.0 K/uL   Eosinophils Relative 1 %   Eosinophils Absolute 0.1 0.0 - 0.5 K/uL   Basophils Relative 0 %   Basophils Absolute 0.0 0.0 - 0.1 K/uL   Immature Granulocytes 0 %   Abs Immature Granulocytes 0.02 0.00 - 0.07 K/uL  Comprehensive metabolic panel     Status: Abnormal   Collection Time: 10/26/18  4:38 AM  Result Value Ref Range   Sodium 135 135 - 145 mmol/L   Potassium 4.3 3.5 - 5.1 mmol/L   Chloride 103 98 - 111 mmol/L   CO2 24 22 - 32 mmol/L   Glucose, Bld 116 (H) 70 - 99 mg/dL   BUN 23 8 - 23 mg/dL   Creatinine, Ser 0.88 0.61 - 1.24 mg/dL   Calcium 9.4 8.9 - 10.3 mg/dL   Total Protein 6.8 6.5 - 8.1 g/dL   Albumin 4.0 3.5 - 5.0 g/dL   AST 32 15 - 41 U/L   ALT 27 0 - 44 U/L   Alkaline Phosphatase 34 (L) 38 - 126 U/L   Total Bilirubin 1.1 0.3 - 1.2 mg/dL   GFR calc non Af Amer >60 >60 mL/min   GFR calc Af Amer >60 >60 mL/min   Anion gap 8 5 - 15  Lipase, blood     Status: None   Collection Time: 10/26/18  4:38 AM  Result Value Ref Range   Lipase 47 11 - 51 U/L   Imaging Studies: No results found.  ED COURSE and MDM  Nursing notes and initial vitals signs, including pulse oximetry, reviewed.  Vitals:   10/26/18 0327 10/26/18 0332 10/26/18 0333  BP: (!) 162/98  Pulse: (!) 39    Resp: 18    Temp:  98.4 F (36.9 C)   TempSrc:  Oral   SpO2: 98%    Weight:   63.5 kg  Height:   5\' 4"  (1.626 m)   5:14 AM Patient's epigastric discomfort improved after oral Carafate and IV Protonix.  He continues to have occasional PVCs but has not been in sustained bigeminy.  We will send a note to his cardiologist, Dr. Stanford Breed.  PROCEDURES    ED DIAGNOSES     ICD-10-CM   1. Epigastric discomfort  R10.13   2. PVCs (premature ventricular contractions)  I49.3         Brent Griffith, [place], MD 10/26/18 [phone]

## 2018-10-26 NOTE — ED Notes (Signed)
Pt states his pain is "much better".

## 2018-10-30 ENCOUNTER — Ambulatory Visit (INDEPENDENT_AMBULATORY_CARE_PROVIDER_SITE_OTHER): Payer: Medicare Other | Admitting: Family

## 2018-10-30 ENCOUNTER — Other Ambulatory Visit: Payer: Self-pay

## 2018-10-30 ENCOUNTER — Encounter: Payer: Self-pay | Admitting: Family

## 2018-10-30 DIAGNOSIS — Z0000 Encounter for general adult medical examination without abnormal findings: Secondary | ICD-10-CM

## 2018-10-30 NOTE — Progress Notes (Signed)
Patient ID: [name], male   DOB: 1936-01-26, 83 y.o.   MRN: [id number] This service is provided via telemedicine  No vital signs collected/recorded due to the encounter was a telemedicine visit.   Location of patient (ex: home, work):  HOME  Patient consents to a telephone visit:  YES  Location of the provider (ex: office, home):  OFFICE  Name of any referring provider:  Sherrie Mustache, NP  Names of all persons participating in the telemedicine service and their role in the encounter:  PATIENT, Brent Griffith, [name], [name], NP  Time spent on call:  8:47

## 2018-10-30 NOTE — Progress Notes (Signed)
Subjective:   Brent Griffith is a 83 y.o. male who presents for a Welcome to Medicare exam.   Review of Systems: Cardiac Risk Factors include: advanced age (>66men, >78 women);hypertension;dyslipidemia;male gender     Objective:    There were no vitals filed for this visit. There is no height or weight on file to calculate BMI.  Medications Outpatient Encounter Medications as of 10/30/2018  Medication Sig  . acetaminophen (TYLENOL) 500 MG tablet Take 1,000 mg by mouth every 6 (six) hours as needed for mild pain or moderate pain.  Marland Kitchen albuterol (PROVENTIL HFA;VENTOLIN HFA) 108 (90 Base) MCG/ACT inhaler Inhale 1-2 puffs into the lungs every 6 (six) hours as needed for wheezing or shortness of breath.  Marland Kitchen alendronate (FOSAMAX) 70 MG tablet Take 1 tablet by mouth once a week.  Marland Kitchen aspirin EC 81 MG tablet Take 1 tablet (81 mg total) by mouth daily.  Marland Kitchen atenolol (TENORMIN) 50 MG tablet TAKE 1 TABLET BY MOUTH EVERY DAY TO CONTROL HEART RHYTHM AND LOWER BLOOD PRESSURE  . b complex vitamins tablet Take 1 tablet by mouth daily.  . Calcium Citrate-Vitamin D (EQ CALCIUM CITRATE+D3 PO) Take 1,200 mg by mouth daily.  . Cholecalciferol (VITAMIN D-3) 5000 units TABS Take 2 tablets by mouth daily.  . Coenzyme Q10 (COQ10) 200 MG CAPS Take 1 capsule by mouth daily.   Marland Kitchen doxazosin (CARDURA) 4 MG tablet TAKE 1 TABLET DAILY TO HELP CONTROL BLOOD PRESSURE  . famotidine (PEPCID) 20 MG tablet Take 20 mg by mouth daily.  . fexofenadine (ALLEGRA) 180 MG tablet Take 180 mg by mouth as needed.   . finasteride (PROSCAR) 5 MG tablet Take 5 mg by mouth every morning.  . folic acid (FOLVITE) 1 MG tablet TAKE 1 TABLET BY MOUTH EVERY DAY  . Multiple Vitamin (MULTIVITAMIN) tablet Take 1 tablet by mouth daily.    . Omega-3 Fatty Acids (FISH OIL) 1000 MG CAPS Take 1 capsule by mouth daily.   . pantoprazole (PROTONIX) 40 MG tablet Take 1 tablet (40 mg total) by mouth daily.  . Probiotic Product (PROBIOTIC DAILY PO) Take 1  capsule by mouth daily.   . rosuvastatin (CRESTOR) 20 MG tablet TAKE ONE TABLET BY MOUTH ONCE DAILY FOR CHOLESTEROL  . Saw Palmetto 160 MG CAPS Take 1 capsule by mouth daily.   . [DISCONTINUED] famotidine (PEPCID) 20 MG tablet Take 20 mg by mouth at bedtime.   No facility-administered encounter medications on file as of 10/30/2018.      History: Past Medical History:  Diagnosis Date  . Acute gastric ulcer without mention of hemorrhage, perforation, or obstruction   . Allergic rhinitis, cause unspecified   . Anemia, unspecified   . Arthritis   . Asthma   . Blood transfusion without reported diagnosis   . CAD (coronary artery disease)   . Diaphragmatic hernia without mention of obstruction or gangrene   . Elevated prostate specific antigen (PSA)   . Enlarged prostate   . Esophageal reflux   . Extrinsic asthma, unspecified   . Herpes zoster without mention of complication   . Hyperlipidemia   . Hypersomnia with sleep apnea, unspecified   . Impotence of organic origin   . Kyphosis   . Lumbago   . Obstructive sleep apnea (adult) (pediatric)   . Pneumonia    hx of years ago   . Senile osteoporosis   . Trigger finger (acquired)   . Tubular adenoma of colon 05/2013  . Unspecified essential hypertension   .  Unspecified sleep apnea    CPAP- settings 4-12   . Unspecified vitamin D deficiency    Past Surgical History:  Procedure Laterality Date  . CARDIAC CATHETERIZATION  03/04/2009   Dr [name]  . COLONOSCOPY    . CORONARY ARTERY BYPASS GRAFT  2010  . CYSTOSCOPY WITH RETROGRADE PYELOGRAM, URETEROSCOPY AND STENT PLACEMENT Right 10/04/2013   Procedure: CYSTOSCOPY WITH RETROGRADE PYELOGRAM,  AND STENT PLACEMENT;  Surgeon: Festus Aloe, MD;  Location: [facility];  Service: Urology;  Laterality: Right;  . MOLE REMOVAL  1958   chin  . ROTATOR CUFF REPAIR Left 04/1998   with bone spur removed, Dr French Ana  . TONSILLECTOMY  1945  . TRANSURETHRAL RESECTION OF PROSTATE N/A 09/24/2013    Procedure: TRANSURETHRAL RESECTION OF THE PROSTATE (TURP) WITH GYRUS (STAGED RIGHT LATERAL AND MEDIAN LOBE);  Surgeon: Ailene Rud, MD;  Location: [facility];  Service: Urology;  Laterality: N/A;  . UPPER GI ENDOSCOPY      Family History  Problem Relation Age of Onset  . Stroke Mother   . Breast cancer Mother   . Cirrhosis Mother        wine  . Rheumatic fever Mother   . Kyphosis Mother   . Hypertension Father   . Stroke Father   . Heart disease Father   . Heart disease Paternal Grandfather   . Colon cancer Neg Hx    Social History   Occupational History  . Occupation: [employer]  Tobacco Use  . Smoking status: Never Smoker  . Smokeless tobacco: Never Used  Substance and Sexual Activity  . Alcohol use: No  . Drug use: No  . Sexual activity: Not Currently   Tobacco Counseling Counseling given: Not Answered   Immunizations and Health Maintenance Immunization History  Administered Date(s) Administered  . Influenza Split 01/30/2012, 02/10/2014  . Influenza Whole 02/17/2009, 02/18/2013  . Influenza, High Dose Seasonal PF 03/19/2018  . Influenza, Quadrivalent, Recombinant, Inj, Pf 02/23/2017  . Influenza,inj,Quad PF,6+ Mos 02/23/2017  . Influenza-Unspecified 02/08/2014, 02/24/2015, 03/16/2016  . Pneumococcal Conjugate-13 04/09/2013  . Pneumococcal Polysaccharide-23 10/18/2004  . Td 09/29/1990  . Tdap 07/25/2011  . Zoster Recombinat (Shingrix) 10/09/2016   Health Maintenance Due  Topic Date Due  . COLONOSCOPY  05/15/2018    Activities of Daily Living In your present state of health, do you have any difficulty performing the following activities: 10/30/2018  Hearing? N  Vision? N  Difficulty concentrating or making decisions? N  Walking or climbing stairs? N  Dressing or bathing? N  Doing errands, shopping? N  Preparing Food and eating ? N  Using the Toilet? N  In the past six months, have you accidently leaked urine? Y  Comment a little  bit sometimes  Do you have problems with loss of bowel control? N  Managing your Medications? N  Managing your Finances? Y  Comment bills and collections  Housekeeping or managing your Housekeeping? N  Some recent data might be hidden    Physical Exam  No other factors deemed appropriate based on the beneficiary's medical and social history and current clinical standards.  Advanced Directives: Does Patient Have a Medical Advance Directive?: No Would patient like information on creating a medical advance directive?: No - Patient declined    Assessment:    This is a routine wellness  examination for this patient .Patient voiced limiting self and wife to two meals per day due to limited financial needs.Having difficulties with finance bills and collections.  Vision/Hearing  screen  Hearing Screening   125Hz  250Hz  500Hz  1000Hz  2000Hz  3000Hz  4000Hz  6000Hz  8000Hz   Right ear:           Left ear:           Comments: No problem hearing  Vision Screening Comments: Last eye exam 08/2018  Dietary issues and exercise activities discussed:  Current Exercise Habits: Structured exercise class, Type of exercise: walking, Time (Minutes): 20, Frequency (Times/Week): 3, Weekly Exercise (Minutes/Week): 60, Intensity: Moderate, Exercise limited by: None identified  Goals    . Exercise 3x per week (30 min per time)     I will like to exercise at least 2-3 times per week at the [facility] or [facility]        Depression Screen [facility] 2/9 Scores 10/30/2018 10/21/2018 04/21/2018 10/09/2016  PHQ - 2 Score 0 0 0 0     Fall Risk Fall Risk  10/30/2018  Falls in the past year? 0  Number falls in past yr: 0  Injury with Fall? 0   Cognitive Function  6CIT Screen 10/30/2018  What Year? 0 points  What month? 0 points  What time? 0 points  Count back from 20 0 points  Months in reverse 0 points  Repeat phrase 0 points  Total Score 0    Patient Care Team: [name], NP as PCP - General (Geriatric Medicine)  [name], MD as Consulting Physician (Sports Medicine)     Plan:  - Awaiting second dose of Shingrix to be administered at the pharmacy. - Colonoscopy upcoming appointment with GI [facility],[id number]   - Patient [facility] coordinator Lattie Haw will follow up with patient to arrange for community resource,food banks to obtain meals due to limiting themselves to two meals due to finance problems.   I have personally reviewed and noted the following in the patient's chart:   . Medical and social history . Use of alcohol, tobacco or illicit drugs  . Current medications and supplements . Functional ability and status . Nutritional status . Physical activity . Advanced directives . List of other physicians . Hospitalizations, surgeries, and ER visits in previous 12 months . Vitals . Screenings to include cognitive, depression, and falls . Referrals and appointments  In addition, I have reviewed and discussed with patient certain preventive protocols, quality metrics, and best practice recommendations. A written personalized care plan for preventive services as well as general preventive health recommendations were provided to patient.    Sandrea Hughs, NP 10/30/2018

## 2018-11-03 NOTE — Progress Notes (Signed)
HPI: FU coronary artery disease.Cardiac catheterization on March 04, 2009 revealed severe three-vessel coronary disease as well as an 80% left main. His ejection fraction was 50%. He ultimately underwent coronary artery bypass and graft on November 1 (left internal mammary artery to left anterior descending, sequential saphenous vein graft to ramus intermedius and obtuse marginal 2, saphenous vein graft to posterior descending. Echocardiogram November 2015 showed normal LV function, grade 1 diastolic dysfunction and moderate left atrial enlargement.Nuclear study April 2018 showed ejection fraction 48%. There was prior inferolateral infarct and mild apical ischemia. Patient treated medically. Emergency room June 2020 with epigastric pain.  He was noted to have PVCs.  His symptoms improved with Carafate and Protonix.  Troponin and liver functions normal.  Since he was last seen, he denies dyspnea, chest pain, palpitations or syncope.   Current Outpatient Medications  Medication Sig Dispense Refill  . acetaminophen (TYLENOL) 500 MG tablet Take 1,000 mg by mouth every 6 (six) hours as needed for mild pain or moderate pain.    Marland Kitchen albuterol (PROVENTIL HFA;VENTOLIN HFA) 108 (90 Base) MCG/ACT inhaler Inhale 1-2 puffs into the lungs every 6 (six) hours as needed for wheezing or shortness of breath. 8.5 Inhaler 0  . alendronate (FOSAMAX) 70 MG tablet Take 1 tablet by mouth once a week.  3  . aspirin EC 81 MG tablet Take 1 tablet (81 mg total) by mouth daily. 90 tablet 3  . atenolol (TENORMIN) 50 MG tablet TAKE 1 TABLET BY MOUTH EVERY DAY TO CONTROL HEART RHYTHM AND LOWER BLOOD PRESSURE 90 tablet 1  . b complex vitamins tablet Take 1 tablet by mouth daily.    . Calcium Citrate-Vitamin D (EQ CALCIUM CITRATE+D3 PO) Take 1,200 mg by mouth daily.    . Cholecalciferol (VITAMIN D-3) 5000 units TABS Take 2 tablets by mouth daily.    . Coenzyme Q10 (COQ10) 200 MG CAPS Take 1 capsule by mouth daily.     Marland Kitchen  doxazosin (CARDURA) 4 MG tablet TAKE 1 TABLET DAILY TO HELP CONTROL BLOOD PRESSURE 90 tablet 4  . famotidine (PEPCID) 20 MG tablet Take 20 mg by mouth daily.    . fexofenadine (ALLEGRA) 180 MG tablet Take 180 mg by mouth as needed.     . finasteride (PROSCAR) 5 MG tablet Take 5 mg by mouth every morning.    . folic acid (FOLVITE) 1 MG tablet TAKE 1 TABLET BY MOUTH EVERY DAY 90 tablet 1  . Multiple Vitamin (MULTIVITAMIN) tablet Take 1 tablet by mouth daily.      . Omega-3 Fatty Acids (FISH OIL) 1000 MG CAPS Take 1 capsule by mouth daily.     . pantoprazole (PROTONIX) 40 MG tablet Take 1 tablet (40 mg total) by mouth daily. 90 tablet 3  . Probiotic Product (PROBIOTIC DAILY PO) Take 1 capsule by mouth daily.     . rosuvastatin (CRESTOR) 20 MG tablet TAKE ONE TABLET BY MOUTH ONCE DAILY FOR CHOLESTEROL 90 tablet 1  . Saw Palmetto 160 MG CAPS Take 1 capsule by mouth daily.      No current facility-administered medications for this visit.      Past Medical History:  Diagnosis Date  . Acute gastric ulcer without mention of hemorrhage, perforation, or obstruction   . Allergic rhinitis, cause unspecified   . Anemia, unspecified   . Arthritis   . Asthma   . Blood transfusion without reported diagnosis   . CAD (coronary artery disease)   . Diaphragmatic hernia  without mention of obstruction or gangrene   . Elevated prostate specific antigen (PSA)   . Enlarged prostate   . Esophageal reflux   . Extrinsic asthma, unspecified   . Herpes zoster without mention of complication   . Hyperlipidemia   . Hypersomnia with sleep apnea, unspecified   . Impotence of organic origin   . Kyphosis   . Lumbago   . Obstructive sleep apnea (adult) (pediatric)   . Pneumonia    hx of years ago   . Senile osteoporosis   . Trigger finger (acquired)   . Tubular adenoma of colon 05/2013  . Unspecified essential hypertension   . Unspecified sleep apnea    CPAP- settings 4-12   . Unspecified vitamin D deficiency      Past Surgical History:  Procedure Laterality Date  . CARDIAC CATHETERIZATION  03/04/2009   Dr [name]  . COLONOSCOPY    . CORONARY ARTERY BYPASS GRAFT  2010  . CYSTOSCOPY WITH RETROGRADE PYELOGRAM, URETEROSCOPY AND STENT PLACEMENT Right 10/04/2013   Procedure: CYSTOSCOPY WITH RETROGRADE PYELOGRAM,  AND STENT PLACEMENT;  Surgeon: Festus Aloe, MD;  Location: [facility];  Service: Urology;  Laterality: Right;  . MOLE REMOVAL  1958   chin  . ROTATOR CUFF REPAIR Left 04/1998   with bone spur removed, Dr French Ana  . TONSILLECTOMY  1945  . TRANSURETHRAL RESECTION OF PROSTATE N/A 09/24/2013   Procedure: TRANSURETHRAL RESECTION OF THE PROSTATE (TURP) WITH GYRUS (STAGED RIGHT LATERAL AND MEDIAN LOBE);  Surgeon: Ailene Rud, MD;  Location: [facility];  Service: Urology;  Laterality: N/A;  . UPPER GI ENDOSCOPY      Social History   Socioeconomic History  . Marital status: Married    Spouse name: Not on file  . Number of children: 0  . Years of education: Not on file  . Highest education level: Not on file  Occupational History  . Occupation: [employer]  Social Needs  . Financial resource strain: Not on file  . Food insecurity    Worry: Not on file    Inability: Not on file  . Transportation needs    Medical: Not on file    Non-medical: Not on file  Tobacco Use  . Smoking status: Never Smoker  . Smokeless tobacco: Never Used  Substance and Sexual Activity  . Alcohol use: No  . Drug use: No  . Sexual activity: Not Currently  Lifestyle  . Physical activity    Days per week: Not on file    Minutes per session: Not on file  . Stress: Not on file  Relationships  . Social Herbalist on phone: Not on file    Gets together: Not on file    Attends religious service: Not on file    Active member of club or organization: Not on file    Attends meetings of clubs or organizations: Not on file    Relationship status: Not on file  . Intimate partner  violence    Fear of current or ex partner: Not on file    Emotionally abused: Not on file    Physically abused: Not on file    Forced sexual activity: Not on file  Other Topics Concern  . Not on file  Social History Narrative  . Not on file    Family History  Problem Relation Age of Onset  . Stroke Mother   . Breast cancer Mother   . Cirrhosis Mother  wine  . Rheumatic fever Mother   . Kyphosis Mother   . Hypertension Father   . Stroke Father   . Heart disease Father   . Heart disease Paternal Grandfather   . Colon cancer Neg Hx     ROS: Some nausea and abdominal discomfort but no fevers or chills, productive cough, hemoptysis, dysphasia, odynophagia, melena, hematochezia, dysuria, hematuria, rash, seizure activity, orthopnea, PND, pedal edema, claudication. Remaining systems are negative.  Physical Exam: Well-developed well-nourished in no acute distress.  Skin is warm and dry.  HEENT is normal.  Neck is supple.  Chest is clear to auscultation with normal expansion.  Cardiovascular exam is regular rate and rhythm.  Abdominal exam nontender or distended. No masses palpated. Extremities show no edema. neuro grossly intact  ECG-October 26, 2018-sinus rhythm with ventricular bigeminy and lateral T wave inversion.  Personally reviewed  A/P  1 coronary artery disease-patient denies chest pain.  Continue medical therapy with aspirin and statin.  2 hypertension-patient's blood pressure is controlled on atenolol; will continue.  3 hyperlipidemia-continue statin.  4 recent PVCs-no history of syncope.  Continue beta-blocker.  No complaints of palpitations.  Kirk Ruths, MD

## 2018-11-05 ENCOUNTER — Encounter: Payer: Self-pay | Admitting: Cardiology

## 2018-11-05 ENCOUNTER — Other Ambulatory Visit: Payer: Self-pay

## 2018-11-05 ENCOUNTER — Ambulatory Visit (INDEPENDENT_AMBULATORY_CARE_PROVIDER_SITE_OTHER): Payer: Medicare Other | Admitting: Cardiology

## 2018-11-05 VITALS — BP 104/58 | HR 61 | Ht 64.0 in | Wt 144.0 lb

## 2018-11-05 DIAGNOSIS — I2510 Atherosclerotic heart disease of native coronary artery without angina pectoris: Secondary | ICD-10-CM

## 2018-11-05 DIAGNOSIS — E7800 Pure hypercholesterolemia, unspecified: Secondary | ICD-10-CM

## 2018-11-05 DIAGNOSIS — I10 Essential (primary) hypertension: Secondary | ICD-10-CM

## 2018-11-05 NOTE — Patient Instructions (Signed)

## 2018-11-14 ENCOUNTER — Other Ambulatory Visit: Payer: Self-pay

## 2018-11-14 ENCOUNTER — Ambulatory Visit
Admission: RE | Admit: 2018-11-14 | Discharge: 2018-11-14 | Disposition: A | Discharge status: Home / Self Care | Payer: Medicare Other | Source: Ambulatory Visit | Attending: Sports Medicine | Admitting: Sports Medicine

## 2018-11-14 DIAGNOSIS — M810 Age-related osteoporosis without current pathological fracture: Secondary | ICD-10-CM

## 2018-11-19 ENCOUNTER — Telehealth: Payer: Self-pay

## 2018-11-19 NOTE — Telephone Encounter (Signed)
Covid-19 screening questions   Do you now or have you had a fever in the last 14 days? No  Do you have any respiratory symptoms of shortness of breath or cough now or in the last 14 days? no  Do you have any family members or close contacts with diagnosed or suspected Covid-19 in the past 14 days? No  Have you been tested for Covid-19 and found to be positive? No

## 2018-11-21 ENCOUNTER — Other Ambulatory Visit: Payer: Self-pay

## 2018-11-21 ENCOUNTER — Encounter: Payer: Self-pay | Admitting: Gastroenterology

## 2018-11-21 ENCOUNTER — Ambulatory Visit (INDEPENDENT_AMBULATORY_CARE_PROVIDER_SITE_OTHER): Payer: Medicare Other | Admitting: Gastroenterology

## 2018-11-21 VITALS — BP 146/74 | HR 64 | Temp 97.9°F | Ht 64.0 in | Wt 143.0 lb

## 2018-11-21 DIAGNOSIS — Z86010 Personal history of colonic polyps: Secondary | ICD-10-CM | POA: Diagnosis not present

## 2018-11-21 DIAGNOSIS — K219 Gastro-esophageal reflux disease without esophagitis: Secondary | ICD-10-CM

## 2018-11-21 DIAGNOSIS — R1013 Epigastric pain: Secondary | ICD-10-CM

## 2018-11-21 NOTE — Patient Instructions (Signed)
We have cancelled your recall Colonoscopies.   Continue pantoprazole and famotidine.   Thank you for choosing me and [facility].  Pricilla Riffle. Dagoberto Ligas., MD., Marval Regal

## 2018-11-21 NOTE — Progress Notes (Signed)
    History of Present Illness: This is an 83 year old male who was evaluated at [facility] ED on June 21 for epigastric pain.  I reviewed the epic records for this visit.  He was given IV pantoprazole and Carafate p.o. and his symptoms improved.  He changed the timing of pantoprazole to every morning instead of every afternoon. His reflux and dyspeptic symptoms have come under good control.   Current Medications, Allergies, Past Medical History, Past Surgical History, Family History and Social History were reviewed in [employer] record.   Physical Exam: General: Well developed, well nourished, elderly, no acute distress Head: Normocephalic and atraumatic Eyes:  sclerae anicteric, EOMI Ears: Normal auditory acuity Mouth: No deformity or lesions Lungs: Clear throughout to auscultation Heart: Regular rate and rhythm; no murmurs, rubs or bruits Abdomen: Soft, non tender and non distended. No masses, hepatosplenomegaly or hernias noted. Normal Bowel sounds Rectal: Not done Musculoskeletal: Symmetrical with no gross deformities  Pulses:  Normal pulses noted Extremities: No clubbing, cyanosis, edema or deformities noted Neurological: Alert oriented x 4, grossly nonfocal Psychological:  Alert and cooperative. Normal mood and affect   Assessment and Recommendations:  1. Personal history of adenomatous colon polyps.  His next surveillance colonoscopy would be due in a 7-year interval in January 2022.  Given his age there no plans for future surveillance colonoscopies and he is in agreement with this plan.  2. GERD.  Continue pantoprazole 40 mg p.o. every morning and famotidine 20 mg p.o. nightly.  Follow antireflux measures.  Gaviscon, Mylanta II or Tums as needed for breakthrough symptoms.  REV in 1 year.

## 2018-12-08 ENCOUNTER — Telehealth: Payer: Self-pay

## 2018-12-08 NOTE — Telephone Encounter (Signed)
Called patient to schedule appointment for him for today per his wife's request got answering machine left message telling him we had an opening for today at 2:45  for him to call the office if he would like to schedule this appointment. Please refer to message on 12/06/2018 from wife Jimmy Footman  [facility]

## 2018-12-09 NOTE — Telephone Encounter (Signed)
I called patient to offer an appointment for today. Patient declined appointment and stated that his symptoms have improved and he thinks it was bug bites.  Patient instructed to call for appointment if symptoms persist or progress

## 2019-02-16 ENCOUNTER — Other Ambulatory Visit: Payer: Self-pay | Admitting: Nurse Practitioner

## 2019-02-16 DIAGNOSIS — K219 Gastro-esophageal reflux disease without esophagitis: Secondary | ICD-10-CM

## 2019-03-04 ENCOUNTER — Other Ambulatory Visit: Payer: Self-pay | Admitting: Nurse Practitioner

## 2019-03-04 DIAGNOSIS — D649 Anemia, unspecified: Secondary | ICD-10-CM

## 2019-03-04 DIAGNOSIS — D561 Beta thalassemia: Secondary | ICD-10-CM

## 2019-04-14 ENCOUNTER — Other Ambulatory Visit: Payer: Self-pay

## 2019-04-14 ENCOUNTER — Other Ambulatory Visit: Payer: Medicare Other

## 2019-04-14 DIAGNOSIS — E785 Hyperlipidemia, unspecified: Secondary | ICD-10-CM

## 2019-04-14 DIAGNOSIS — I10 Essential (primary) hypertension: Secondary | ICD-10-CM

## 2019-04-14 LAB — CBC WITH DIFFERENTIAL/PLATELET
Absolute Monocytes: 495 cells/uL (ref 200–950)
Basophils Absolute: 20 cells/uL (ref 0–200)
Basophils Relative: 0.4 %
Eosinophils Absolute: 160 cells/uL (ref 15–500)
Eosinophils Relative: 3.2 %
HCT: 36.2 % — ABNORMAL LOW (ref 38.5–50.0)
Hemoglobin: 12.6 g/dL — ABNORMAL LOW (ref 13.2–17.1)
Lymphs Abs: 1490 cells/uL (ref 850–3900)
MCH: 31.9 pg (ref 27.0–33.0)
MCHC: 34.8 g/dL (ref 32.0–36.0)
MCV: 91.6 fL (ref 80.0–100.0)
MPV: 11.1 fL (ref 7.5–12.5)
Monocytes Relative: 9.9 %
Neutro Abs: 2835 cells/uL (ref 1500–7800)
Neutrophils Relative %: 56.7 %
Platelets: 169 10*3/uL (ref 140–400)
RBC: 3.95 10*6/uL — ABNORMAL LOW (ref 4.20–5.80)
RDW: 12 % (ref 11.0–15.0)
Total Lymphocyte: 29.8 %
WBC: 5 10*3/uL (ref 3.8–10.8)

## 2019-04-14 LAB — COMPLETE METABOLIC PANEL WITH GFR
AG Ratio: 1.6 (calc) (ref 1.0–2.5)
ALT: 13 U/L (ref 9–46)
AST: 20 U/L (ref 10–35)
Albumin: 4.2 g/dL (ref 3.6–5.1)
Alkaline phosphatase (APISO): 45 U/L (ref 35–144)
BUN: 14 mg/dL (ref 7–25)
CO2: 26 mmol/L (ref 20–32)
Calcium: 9.5 mg/dL (ref 8.6–10.3)
Chloride: 104 mmol/L (ref 98–110)
Creat: 0.98 mg/dL (ref 0.70–1.11)
GFR, Est African American: 83 mL/min/{1.73_m2} (ref >=60)
GFR, Est Non African American: 72 mL/min/{1.73_m2} (ref >=60)
Globulin: 2.6 g/dL (calc) (ref 1.9–3.7)
Glucose, Bld: 89 mg/dL (ref 65–99)
Potassium: 4 mmol/L (ref 3.5–5.3)
Sodium: 138 mmol/L (ref 135–146)
Total Bilirubin: 1.5 mg/dL — ABNORMAL HIGH (ref 0.2–1.2)
Total Protein: 6.8 g/dL (ref 6.1–8.1)

## 2019-04-14 LAB — LIPID PANEL
Cholesterol: 118 mg/dL (ref <200)
HDL: 60 mg/dL (ref >=40)
LDL Cholesterol (Calc): 44 mg/dL (calc)
Non-HDL Cholesterol (Calc): 58 mg/dL (calc) (ref <130)
Total CHOL/HDL Ratio: 2 (calc) (ref <5.0)
Triglycerides: 63 mg/dL (ref <150)

## 2019-04-15 ENCOUNTER — Other Ambulatory Visit: Payer: Medicare Other

## 2019-04-23 ENCOUNTER — Ambulatory Visit: Payer: Medicare Other | Admitting: Nurse Practitioner

## 2019-04-24 ENCOUNTER — Other Ambulatory Visit: Payer: Self-pay

## 2019-04-24 ENCOUNTER — Ambulatory Visit (INDEPENDENT_AMBULATORY_CARE_PROVIDER_SITE_OTHER): Payer: Medicare Other | Admitting: Nurse Practitioner

## 2019-04-24 ENCOUNTER — Encounter: Payer: Self-pay | Admitting: Nurse Practitioner

## 2019-04-24 VITALS — BP 112/60 | HR 62 | Temp 97.5°F | Ht 64.0 in | Wt 142.0 lb

## 2019-04-24 DIAGNOSIS — N401 Enlarged prostate with lower urinary tract symptoms: Secondary | ICD-10-CM

## 2019-04-24 DIAGNOSIS — K219 Gastro-esophageal reflux disease without esophagitis: Secondary | ICD-10-CM

## 2019-04-24 DIAGNOSIS — I10 Essential (primary) hypertension: Secondary | ICD-10-CM

## 2019-04-24 DIAGNOSIS — G4733 Obstructive sleep apnea (adult) (pediatric): Secondary | ICD-10-CM

## 2019-04-24 DIAGNOSIS — M810 Age-related osteoporosis without current pathological fracture: Secondary | ICD-10-CM

## 2019-04-24 DIAGNOSIS — B079 Viral wart, unspecified: Secondary | ICD-10-CM

## 2019-04-24 DIAGNOSIS — E785 Hyperlipidemia, unspecified: Secondary | ICD-10-CM

## 2019-04-24 NOTE — Patient Instructions (Addendum)
Increase fiber in diet.   Follow up in 6 months with lab work before visit

## 2019-04-24 NOTE — Progress Notes (Signed)
Careteam: Patient Care Team: [name], NP as PCP - General (Geriatric Medicine) [name], MD as Consulting Physician (Sports Medicine)  Advanced Directive information    Allergies  Allergen Reactions  . Compazine [Prochlorperazine Edisylate] Anxiety    Chief Complaint  Patient presents with  . Medical Management of Chronic Issues    6 month follow-up and discuss labs  . Hand Problem    Examine wart on hand   . Advanced Directive    Discuss Advance Care Planning   . Quality Metric Gaps    Discuss need for colonoscopy      HPI: Patient is a 83 y.o. male seen in the office today for routine follow up.   htn- controlled on atenolol and cardura   Hyperlipidemia- controlled on crestor.   BPH- follows with urologist, reports urine is a little slower now. When he drinks a lot of water before bed he has to get up more at night.normally not an issue.  Continues on finasteride and saw palmetto for prostate  GERD- controlled on pepcid and protonix (does not always need protonix)  Osteoporosis- following with [name] in regards to this, he has stopped the fosamax and bone scan will be repeated in 2 years.   Not due for colonoscopy until January 2022  Got a notice from the city to clean up his yard- he was able to do it himself.   Review of Systems:  Review of Systems  Constitutional: Negative for chills, fever and weight loss.  HENT: Negative for tinnitus.   Respiratory: Negative for cough, sputum production and shortness of breath.   Cardiovascular: Negative for chest pain, palpitations and leg swelling.  Gastrointestinal: Positive for constipation (occasionally). Negative for abdominal pain, diarrhea and heartburn.  Genitourinary: Negative for dysuria, frequency and urgency.  Musculoskeletal: Negative for back pain, falls, joint pain and myalgias.  Skin: Negative.        Dry skin occasionally Warts on right hand- been there for years, most of  them are worn down  Neurological: Negative for dizziness and headaches.  Psychiatric/Behavioral: Negative for depression and memory loss. The patient does not have insomnia.     Past Medical History:  Diagnosis Date  . Acute gastric ulcer without mention of hemorrhage, perforation, or obstruction   . Allergic rhinitis, cause unspecified   . Anemia, unspecified   . Arthritis   . Asthma   . Blood transfusion without reported diagnosis   . CAD (coronary artery disease)   . Diaphragmatic hernia without mention of obstruction or gangrene   . Elevated prostate specific antigen (PSA)   . Enlarged prostate   . Esophageal reflux   . Extrinsic asthma, unspecified   . Herpes zoster without mention of complication   . Hyperlipidemia   . Hypersomnia with sleep apnea, unspecified   . Impotence of organic origin   . Kyphosis   . Lumbago   . Obstructive sleep apnea (adult) (pediatric)   . Pneumonia    hx of years ago   . Senile osteoporosis   . Trigger finger (acquired)   . Tubular adenoma of colon 05/2013  . Unspecified essential hypertension   . Unspecified sleep apnea    CPAP- settings 4-12   . Unspecified vitamin D deficiency    Past Surgical History:  Procedure Laterality Date  . CARDIAC CATHETERIZATION  03/04/2009   Dr [name]  . COLONOSCOPY    . CORONARY ARTERY BYPASS GRAFT  2010  . CYSTOSCOPY WITH RETROGRADE PYELOGRAM,  URETEROSCOPY AND STENT PLACEMENT Right 10/04/2013   Procedure: CYSTOSCOPY WITH RETROGRADE PYELOGRAM,  AND STENT PLACEMENT;  Surgeon: Festus Aloe, MD;  Location: [facility];  Service: Urology;  Laterality: Right;  . MOLE REMOVAL  1958   chin  . ROTATOR CUFF REPAIR Left 04/1998   with bone spur removed, Dr French Ana  . TONSILLECTOMY  1945  . TRANSURETHRAL RESECTION OF PROSTATE N/A 09/24/2013   Procedure: TRANSURETHRAL RESECTION OF THE PROSTATE (TURP) WITH GYRUS (STAGED RIGHT LATERAL AND MEDIAN LOBE);  Surgeon: Ailene Rud, MD;  Location: [facility];   Service: Urology;  Laterality: N/A;  . UPPER GI ENDOSCOPY     Social History:   reports that he has never smoked. He has never used smokeless tobacco. He reports that he does not drink alcohol or use drugs.  Family History  Problem Relation Age of Onset  . Stroke Mother   . Breast cancer Mother   . Cirrhosis Mother        wine  . Rheumatic fever Mother   . Kyphosis Mother   . Hypertension Father   . Stroke Father   . Heart disease Father   . Heart disease Paternal Grandfather   . Colon cancer Neg Hx     Medications: Patient's Medications  New Prescriptions   No medications on file  Previous Medications   ACETAMINOPHEN (TYLENOL) 500 MG TABLET    Take 1,000 mg by mouth every 6 (six) hours as needed for mild pain or moderate pain.   ALBUTEROL (PROVENTIL HFA;VENTOLIN HFA) 108 (90 BASE) MCG/ACT INHALER    Inhale 1-2 puffs into the lungs every 6 (six) hours as needed for wheezing or shortness of breath.   ASPIRIN EC 81 MG TABLET    Take 1 tablet (81 mg total) by mouth daily.   ATENOLOL (TENORMIN) 50 MG TABLET    TAKE 1 TABLET BY MOUTH EVERY DAY TO CONTROL HEART RHYTHM AND LOWER BLOOD PRESSURE   B COMPLEX VITAMINS TABLET    Take 1 tablet by mouth daily.   CALCIUM CITRATE-VITAMIN D (EQ CALCIUM CITRATE+D3 PO)    Take 1,200 mg by mouth daily.   CHOLECALCIFEROL (VITAMIN D-3) 5000 UNITS TABS    Take 2 tablets by mouth daily.   COENZYME Q10 (COQ10) 200 MG CAPS    Take 1 capsule by mouth daily.    DOXAZOSIN (CARDURA) 4 MG TABLET    TAKE 1 TABLET DAILY TO HELP CONTROL BLOOD PRESSURE   FAMOTIDINE (PEPCID) 20 MG TABLET    Take 20 mg by mouth daily.   FEXOFENADINE (ALLEGRA) 180 MG TABLET    Take 180 mg by mouth as needed.    FINASTERIDE (PROSCAR) 5 MG TABLET    Take 5 mg by mouth every morning.   FOLIC ACID (FOLVITE) 1 MG TABLET    TAKE 1 TABLET BY MOUTH EVERY DAY   MULTIPLE VITAMIN (MULTIVITAMIN) TABLET    Take 1 tablet by mouth daily.     OMEGA-3 FATTY ACIDS (FISH OIL) 1000 MG CAPS    Take 1  capsule by mouth daily.    PANTOPRAZOLE (PROTONIX) 40 MG TABLET    TAKE 1 TABLET BY MOUTH EVERY DAY   PROBIOTIC PRODUCT (PROBIOTIC DAILY PO)    Take 1 capsule by mouth daily.    ROSUVASTATIN (CRESTOR) 20 MG TABLET    TAKE ONE TABLET BY MOUTH ONCE DAILY FOR CHOLESTEROL   SAW PALMETTO 160 MG CAPS    Take 1 capsule by mouth daily.   Modified Medications  No medications on file  Discontinued Medications   ALENDRONATE (FOSAMAX) 70 MG TABLET    Take 1 tablet by mouth once a week.    Physical Exam:  Vitals:   04/24/19 1425  BP: 112/60  Pulse: 62  Temp: (!) 97.5 F (36.4 C)  TempSrc: Temporal  SpO2: 98%  Weight: 142 lb (64.4 kg)  Height: 5\' 4"  (1.626 m)   Body mass index is 24.37 kg/m. Wt Readings from Last 3 Encounters:  04/24/19 142 lb (64.4 kg)  11/21/18 143 lb (64.9 kg)  11/05/18 144 lb (65.3 kg)    Physical Exam Constitutional:      General: He is not in acute distress.    Appearance: He is well-developed.  HENT:     Head: Normocephalic and atraumatic.     Nose: Nose normal.  Eyes:     Conjunctiva/sclera: Conjunctivae normal.     Pupils: Pupils are equal, round, and reactive to light.  Cardiovascular:     Rate and Rhythm: Normal rate and regular rhythm.     Heart sounds: Normal heart sounds.  Pulmonary:     Effort: Pulmonary effort is normal. No respiratory distress.     Breath sounds: Normal breath sounds.  Abdominal:     General: Bowel sounds are normal.     Palpations: Abdomen is soft.  Musculoskeletal:        General: No tenderness. Normal range of motion.     Cervical back: Normal range of motion and neck supple.  Skin:    General: Skin is warm and dry.     Coloration: Skin is not pale.     Findings: No erythema or rash.  Neurological:     Mental Status: He is alert and oriented to person, place, and time.     Cranial Nerves: No cranial nerve deficit.     Coordination: Coordination normal.     Deep Tendon Reflexes: Reflexes are normal and symmetric.    Psychiatric:        Behavior: Behavior normal.        Thought Content: Thought content normal.        Judgment: Judgment normal.     Labs reviewed: Basic Metabolic Panel: Recent Labs    10/13/18 1006 10/26/18 0438 04/14/19 1142  NA 137 135 138  K 4.0 4.3 4.0  CL 104 103 104  CO2 27 24 26   GLUCOSE 93 116* 89  BUN 20 23 14   CREATININE 0.98 0.88 0.98  CALCIUM 9.3 9.4 9.5   Liver Function Tests: Recent Labs    10/13/18 1006 10/26/18 0438 04/14/19 1142  AST 19 32 20  ALT 14 27 13   ALKPHOS  --  34*  --   BILITOT 1.3* 1.1 1.5*  PROT 6.4 6.8 6.8  ALBUMIN  --  4.0  --    Recent Labs    10/26/18 0438  LIPASE 47   No results for input(s): AMMONIA in the last 8760 hours. CBC: Recent Labs    10/13/18 1006 10/26/18 0438 04/14/19 1142  WBC 5.8 6.8 5.0  NEUTROABS 3,468 5.0 2,835  HGB 12.8* 12.3* 12.6*  HCT 37.4* 36.0* 36.2*  MCV 89.7 89.6 91.6  PLT 169 161 169   Lipid Panel: Recent Labs    10/13/18 1006 04/14/19 1142  CHOL 117 118  HDL 55 60  LDLCALC 48 44  TRIG 55 63  CHOLHDL 2.1 2.0   TSH: No results for input(s): TSH in the last 8760 hours. A1C: Lab Results  Component  Value Date   HGBA1C 5.6 07/28/2012     Assessment/Plan 1. Essential hypertension Controlled on current regimen, continues on atenolol 50 mg daily with cardura.    2. Obstructive sleep apnea Maintained on CPAP  3. Gastroesophageal reflux disease, unspecified whether esophagitis present Stable on famotidine   4. Senile osteoporosis Managed by orthopedics, completed fosamax at this time.   5. Benign prostatic hyperplasia with lower urinary tract symptoms, symptom details unspecified -overall stable on finasteride, following with urologist.   6. Dyslipidemia -LDL at goal on Crestor 20 mg daily. Does not make dietary modifications due to limited availability in food. Reports they eat for convenience.  7. Viral warts, unspecified type Ongoing, several scattered warts to right  hand but do not cause any discomfort and not aware of them most the time. Will monitor for now.   Next appt: 6 months for routine follow up.  Carlos American. [place], [facility] [phone]

## 2019-06-02 ENCOUNTER — Other Ambulatory Visit: Payer: Self-pay | Admitting: Nurse Practitioner

## 2019-06-04 ENCOUNTER — Encounter: Payer: Self-pay | Admitting: Nurse Practitioner

## 2019-06-13 ENCOUNTER — Other Ambulatory Visit: Payer: Self-pay | Admitting: Nurse Practitioner

## 2019-06-13 DIAGNOSIS — I10 Essential (primary) hypertension: Secondary | ICD-10-CM

## 2019-08-29 ENCOUNTER — Encounter: Payer: Self-pay | Admitting: Internal Medicine

## 2019-08-31 ENCOUNTER — Encounter: Payer: Self-pay | Admitting: Internal Medicine

## 2019-08-31 ENCOUNTER — Other Ambulatory Visit: Payer: Self-pay

## 2019-08-31 ENCOUNTER — Ambulatory Visit (INDEPENDENT_AMBULATORY_CARE_PROVIDER_SITE_OTHER): Payer: Medicare Other | Admitting: Internal Medicine

## 2019-08-31 DIAGNOSIS — G4733 Obstructive sleep apnea (adult) (pediatric): Secondary | ICD-10-CM | POA: Diagnosis not present

## 2019-08-31 DIAGNOSIS — R0602 Shortness of breath: Secondary | ICD-10-CM

## 2019-08-31 NOTE — Assessment & Plan Note (Signed)
Single episode of laryngospasm after inhaling irritant dust- discussed. Very occasional use of rescue inhaler has been sufficient.

## 2019-08-31 NOTE — Patient Instructions (Signed)
We can continue CPAP auto 6-12, mask of choice, humidifier, supplies, AirView/ card   Please call if we can help

## 2019-08-31 NOTE — Assessment & Plan Note (Signed)
Benefits from CPAP, confirmed with good compliance and control Plan- continue CPAP auto 6-12

## 2019-08-31 NOTE — Progress Notes (Signed)
Subjective:    Patient ID: Brent Griffith, male    DOB: 04-May-1936, 84 y.o.   MRN: [id number]  HPI  male never smoker followed for OSA, periodic limb movement, complicated by CAD/CABG,   HBP, GERD,                 NPSG 12/15/04- AHI  9.2/ hr, desaturation to 76%, PLMA 13.6/ hr, body weight 170 lbs Heart surgery Nov 2010 CABG 4v PFT: 05/04/2014: Normal spirometry flows, insignificant response to bronchodilator, moderate diffusion defect 59% of predicted, possible air trapping ---------------------------------------------------------------------------------------------------------  08/29/2018- Virtual Visit via Telephone Note  History of Present Illness:  84 yo male never smoker followed for OSA, periodic limb movement, complicated by CAD/CABG,  HBP, GERD,    CPAP auto 6-12/Advanced Download 93% compliance, AHI 4.4/ hr He reports good compliance and no concerns, using CPAP every night and sleeping better with it. No new sleep problems and no acute medical issues since last visit.  He has albuterol rescue inhaler but rarely using it.    Observations/Objective: NPSG 12/15/04- AHI  9.2/ hr, desaturation to 76%, PLMA 13.6/ hr, body weight 170 lbs Heart surgery Nov 2010 CABG 4v PFT: 05/04/2014: Normal spirometry flows, insignificant response to bronchodilator, moderate diffusion defect 59% of predicted, possible air trapping CPAP auto 6-12/Advanced  Assessment and Plan: OSA- continues to benefit CAD- denies recent cardiac problems.   Follow Up Instructions: 1 year  Baird Lyons, MD  08/31/19- 84 yo male never smoker followed for OSA, periodic limb movement, complicated by CAD/CABG,  HBP, GERD,    CPAP auto 6-12/Adapt Download compliance 97%, AHI 4.1/ hr -----f/u OSA. Breathing is at baseline. ED visit 10/26/2018- Epigastric pain, ventricular bigeminy Proventil hfa,  Had 2 Moderna Covax. Feels table with no concerns to report. Comfortable with CPAP. No recent use of rescue inhaler-  discussed expiration dates. Accidentally inhaled undisolved Goody Powder dust, triggering laryngospasm that was self limited- discussed.  Review of Systems- see HPI   + = positive Constitutional:   No-   weight loss, night sweats, fevers, chills, fatigue, lassitude. HEENT:   No-   headaches, difficulty swallowing, tooth/dental problems, sore throat,                  No-   sneezing, itching, ear ache, nasal congestion, +post nasal drip,  CV:  No-   chest pain, orthopnea, PND, swelling in lower extremities, anasarca,dizziness, palpitations GI:  No-   heartburn, indigestion, abdominal pain, nausea, vomiting,  Resp: +  shortness of breath with exertion or at rest.  No-  excess mucus,             No-   productive cough,   non-productive cough,  No-  coughing up of blood.              No-   change in color of mucus.  No- wheezing.   Skin: No-   rash or lesions. GU: . MS:  No-   joint pain or swelling. Marland Kitchen Psych:  No- change in mood or affect. No depression or anxiety.  No memory loss.    Objective:   General- Alert, Oriented, Affect-appropriate, Distress- none acute.  Skin- rash-none, lesions- none, excoriation- none Lymphadenopathy- none Head- atraumatic            Eyes- Gross vision intact, PERRLA, conjunctivae clear secretions            Ears- Hearing, canals normal  Nose- Clear, +Septal dev/ external nasal deviation,  No-mucus, polyps, erosion, perforation             Throat- Mallampati II-III , mucosa clear , drainage- none, tonsils- atrophic Neck- flexible , trachea midline, no stridor , thyroid nl, carotid no bruit Chest - symmetrical excursion , unlabored           Heart/CV- RRR , no murmur , no gallop  , no rub, nl s1 s2                           - JVD- none , edema- none, stasis changes- none, varices- none           Lung- clear to P&A, wheeze- none, cough-none, dullness-none, rub- none           Chest wall-  Abd-  Br/ Gen/ Rectal- Not done, not indicated Extrem-  cyanosis- none, clubbing, none, atrophy- none, strength- nl Neuro- grossly intact to observation     Assessment & Plan:

## 2019-09-11 ENCOUNTER — Other Ambulatory Visit: Payer: Self-pay

## 2019-09-11 MED ORDER — FAMOTIDINE 20 MG PO TABS: 20.0000 mg | ORAL_TABLET | Freq: Every day | ORAL | 1 refills | Status: DC

## 2019-09-11 MED ORDER — ROSUVASTATIN CALCIUM 20 MG PO TABS: ORAL_TABLET | ORAL | 1 refills | Status: DC

## 2019-09-11 NOTE — Telephone Encounter (Signed)
Patient mail order pharmacy [facility] is requesting refills for patient. Patient last office visit was 04/24/2019.  Folic Acid, Albuterol, and Rosuvastatin already have quantity, descriptions, and refills. However Finasteride, Famotidine, and Vitamin D3 don't have any refills, and quantity.. Please Advise.

## 2019-09-21 ENCOUNTER — Other Ambulatory Visit: Payer: Self-pay

## 2019-09-21 ENCOUNTER — Telehealth: Payer: Self-pay

## 2019-09-21 DIAGNOSIS — K219 Gastro-esophageal reflux disease without esophagitis: Secondary | ICD-10-CM

## 2019-09-21 DIAGNOSIS — I10 Essential (primary) hypertension: Secondary | ICD-10-CM

## 2019-09-21 DIAGNOSIS — D561 Beta thalassemia: Secondary | ICD-10-CM

## 2019-09-21 DIAGNOSIS — R053 Chronic cough: Secondary | ICD-10-CM

## 2019-09-21 DIAGNOSIS — D649 Anemia, unspecified: Secondary | ICD-10-CM

## 2019-09-21 MED ORDER — ALBUTEROL SULFATE HFA 108 (90 BASE) MCG/ACT IN AERS: 1.0000 | INHALATION_SPRAY | Freq: Four times a day (QID) | RESPIRATORY_TRACT | 1 refills | Status: DC | PRN

## 2019-09-21 MED ORDER — PANTOPRAZOLE SODIUM 40 MG PO TBEC: 40.0000 mg | DELAYED_RELEASE_TABLET | Freq: Every day | ORAL | 3 refills | Status: DC

## 2019-09-21 MED ORDER — ROSUVASTATIN CALCIUM 20 MG PO TABS: ORAL_TABLET | ORAL | 1 refills | Status: DC

## 2019-09-21 MED ORDER — DOXAZOSIN MESYLATE 4 MG PO TABS: ORAL_TABLET | ORAL | 1 refills | Status: DC

## 2019-09-21 MED ORDER — FAMOTIDINE 20 MG PO TABS: 20.0000 mg | ORAL_TABLET | Freq: Every day | ORAL | 1 refills | Status: DC

## 2019-09-21 MED ORDER — ATENOLOL 50 MG PO TABS: ORAL_TABLET | ORAL | 1 refills | Status: DC

## 2019-09-21 MED ORDER — FOLIC ACID 1 MG PO TABS: 1.0000 mg | ORAL_TABLET | Freq: Every day | ORAL | 1 refills | Status: DC

## 2019-09-21 NOTE — Telephone Encounter (Deleted)
1st attempt to call patient and no answer. Voicemail was left with office call back number. Patient made aware that there will be 2 more attempts to reach out. Patient also made aware in voicemail that 3rd attempt will lead to rescheduling appointment.   

## 2019-09-21 NOTE — Telephone Encounter (Signed)
Disregard previously routed message.

## 2019-09-21 NOTE — Telephone Encounter (Signed)
Okay 

## 2019-09-21 NOTE — Telephone Encounter (Signed)
Patient pharmacy [facility] requesting refills on medication. I don't see that you've refilled Finasteride before. Medication has no Quantity, directions, and refills. Please Advise.

## 2019-09-21 NOTE — Telephone Encounter (Signed)
Yes I would tell him we are not refilling that and to call his urologist if he is needed refill

## 2019-09-21 NOTE — Telephone Encounter (Signed)
Patient was informed. Patient also had fax for other medications sent over. Patient just had medication filled at last appointment but has switched pharmacy due to insurance. Please Advise.

## 2019-09-23 ENCOUNTER — Other Ambulatory Visit: Payer: Self-pay | Admitting: *Deleted

## 2019-09-23 DIAGNOSIS — D649 Anemia, unspecified: Secondary | ICD-10-CM

## 2019-09-23 DIAGNOSIS — R053 Chronic cough: Secondary | ICD-10-CM

## 2019-09-23 DIAGNOSIS — I10 Essential (primary) hypertension: Secondary | ICD-10-CM

## 2019-09-23 DIAGNOSIS — K219 Gastro-esophageal reflux disease without esophagitis: Secondary | ICD-10-CM

## 2019-09-23 DIAGNOSIS — D561 Beta thalassemia: Secondary | ICD-10-CM

## 2019-09-23 MED ORDER — PANTOPRAZOLE SODIUM 40 MG PO TBEC: 40.0000 mg | DELAYED_RELEASE_TABLET | Freq: Every day | ORAL | 1 refills | Status: DC

## 2019-09-23 MED ORDER — ATENOLOL 50 MG PO TABS: ORAL_TABLET | ORAL | 1 refills | Status: DC

## 2019-09-23 MED ORDER — FAMOTIDINE 20 MG PO TABS: 20.0000 mg | ORAL_TABLET | Freq: Every day | ORAL | 1 refills | Status: DC

## 2019-09-23 MED ORDER — ALBUTEROL SULFATE HFA 108 (90 BASE) MCG/ACT IN AERS: 1.0000 | INHALATION_SPRAY | Freq: Four times a day (QID) | RESPIRATORY_TRACT | 1 refills | Status: DC | PRN

## 2019-09-23 MED ORDER — FOLIC ACID 1 MG PO TABS: 1.0000 mg | ORAL_TABLET | Freq: Every day | ORAL | 1 refills | Status: DC

## 2019-09-23 MED ORDER — ROSUVASTATIN CALCIUM 20 MG PO TABS: ORAL_TABLET | ORAL | 1 refills | Status: DC

## 2019-09-23 MED ORDER — DOXAZOSIN MESYLATE 4 MG PO TABS: ORAL_TABLET | ORAL | 1 refills | Status: DC

## 2019-09-23 NOTE — Telephone Encounter (Signed)
Received fax from [place] requesting refills.  Called and verified with patient. Agreed he requested. Faxed Rx's to pharmacy.

## 2019-10-01 DIAGNOSIS — G4733 Obstructive sleep apnea (adult) (pediatric): Secondary | ICD-10-CM | POA: Diagnosis not present

## 2019-10-19 ENCOUNTER — Other Ambulatory Visit: Payer: Self-pay | Admitting: Nurse Practitioner

## 2019-10-19 ENCOUNTER — Other Ambulatory Visit: Payer: Medicare Other

## 2019-10-19 ENCOUNTER — Other Ambulatory Visit: Payer: Self-pay

## 2019-10-19 DIAGNOSIS — I10 Essential (primary) hypertension: Secondary | ICD-10-CM | POA: Diagnosis not present

## 2019-10-19 LAB — COMPLETE METABOLIC PANEL WITH GFR
AG Ratio: 1.7 (calc) (ref 1.0–2.5)
ALT: 15 U/L (ref 9–46)
AST: 17 U/L (ref 10–35)
Albumin: 4 g/dL (ref 3.6–5.1)
Alkaline phosphatase (APISO): 41 U/L (ref 35–144)
BUN: 14 mg/dL (ref 7–25)
CO2: 24 mmol/L (ref 20–32)
Calcium: 8.9 mg/dL (ref 8.6–10.3)
Chloride: 107 mmol/L (ref 98–110)
Creat: 1.04 mg/dL (ref 0.70–1.11)
GFR, Est African American: 78 mL/min/{1.73_m2} (ref >=60)
GFR, Est Non African American: 67 mL/min/{1.73_m2} (ref >=60)
Globulin: 2.4 g/dL (calc) (ref 1.9–3.7)
Glucose, Bld: 93 mg/dL (ref 65–99)
Potassium: 4.3 mmol/L (ref 3.5–5.3)
Sodium: 140 mmol/L (ref 135–146)
Total Bilirubin: 1.1 mg/dL (ref 0.2–1.2)
Total Protein: 6.4 g/dL (ref 6.1–8.1)

## 2019-10-19 LAB — CBC WITH DIFFERENTIAL/PLATELET
Absolute Monocytes: 377 cells/uL (ref 200–950)
Basophils Absolute: 31 cells/uL (ref 0–200)
Basophils Relative: 0.6 %
Eosinophils Absolute: 168 cells/uL (ref 15–500)
Eosinophils Relative: 3.3 %
HCT: 35.6 % — ABNORMAL LOW (ref 38.5–50.0)
Hemoglobin: 12.1 g/dL — ABNORMAL LOW (ref 13.2–17.1)
Lymphs Abs: 1377 cells/uL (ref 850–3900)
MCH: 31.2 pg (ref 27.0–33.0)
MCHC: 34 g/dL (ref 32.0–36.0)
MCV: 91.8 fL (ref 80.0–100.0)
MPV: 11.6 fL (ref 7.5–12.5)
Monocytes Relative: 7.4 %
Neutro Abs: 3147 cells/uL (ref 1500–7800)
Neutrophils Relative %: 61.7 %
Platelets: 163 10*3/uL (ref 140–400)
RBC: 3.88 10*6/uL — ABNORMAL LOW (ref 4.20–5.80)
RDW: 12 % (ref 11.0–15.0)
Total Lymphocyte: 27 %
WBC: 5.1 10*3/uL (ref 3.8–10.8)

## 2019-10-23 ENCOUNTER — Ambulatory Visit (INDEPENDENT_AMBULATORY_CARE_PROVIDER_SITE_OTHER): Payer: Medicare Other | Admitting: Nurse Practitioner

## 2019-10-23 ENCOUNTER — Encounter: Payer: Self-pay | Admitting: Nurse Practitioner

## 2019-10-23 ENCOUNTER — Other Ambulatory Visit: Payer: Self-pay

## 2019-10-23 VITALS — BP 112/66 | HR 65 | Temp 96.9°F | Ht 64.0 in | Wt 142.0 lb

## 2019-10-23 DIAGNOSIS — G4733 Obstructive sleep apnea (adult) (pediatric): Secondary | ICD-10-CM | POA: Diagnosis not present

## 2019-10-23 DIAGNOSIS — I10 Essential (primary) hypertension: Secondary | ICD-10-CM | POA: Diagnosis not present

## 2019-10-23 DIAGNOSIS — I2510 Atherosclerotic heart disease of native coronary artery without angina pectoris: Secondary | ICD-10-CM | POA: Diagnosis not present

## 2019-10-23 DIAGNOSIS — K219 Gastro-esophageal reflux disease without esophagitis: Secondary | ICD-10-CM

## 2019-10-23 DIAGNOSIS — J3089 Other allergic rhinitis: Secondary | ICD-10-CM | POA: Diagnosis not present

## 2019-10-23 DIAGNOSIS — J302 Other seasonal allergic rhinitis: Secondary | ICD-10-CM

## 2019-10-23 DIAGNOSIS — N401 Enlarged prostate with lower urinary tract symptoms: Secondary | ICD-10-CM

## 2019-10-23 DIAGNOSIS — M810 Age-related osteoporosis without current pathological fracture: Secondary | ICD-10-CM

## 2019-10-23 NOTE — Patient Instructions (Signed)

## 2019-10-23 NOTE — Progress Notes (Signed)
Careteam: Patient Care Team: Brent Chandler, NP as PCP - General (Geriatric Medicine) [name], MD as Consulting Physician (Sports Medicine)  PLACE OF SERVICE:  [place] Directive information Does Patient Have a Medical Advance Directive?: No, Would patient like information on creating a medical advance directive?: No - Patient declined  Allergies  Allergen Reactions   Compazine [Prochlorperazine Edisylate] Anxiety    Chief Complaint  Patient presents with   Medical Management of Chronic Issues    6 month follow-up and discuss labs (copy printed)   Weight Loss    Decreased appetite      HPI: Patient is a 84 y.o. male for routine follow up  BPH- no changes in urinary symptoms.   GERD- no symptoms unless he eats late or heavy food.   Osteoporosis- off fosamax, continues to follow up with [name]. Continues on cal and vit d per recommendations of [name].   [name]- controlled on atenolol and cardura   Hyperlipidemia- continues crestor 20 mg daily  CAD- no chest pains, continues on atenolol, followed by cardiologist routinely    Decrease appetite but eating routinely.  OSA- continues on CPAP  Review of Systems:  Review of Systems  Constitutional: Negative for chills, fever and weight loss.  Respiratory: Negative for cough, sputum production and shortness of breath.   Cardiovascular: Negative for chest pain, palpitations and leg swelling.  Gastrointestinal: Negative for abdominal pain, constipation, diarrhea and heartburn.  Genitourinary: Negative for dysuria, frequency and urgency.  Musculoskeletal: Negative for back pain, falls, joint pain and myalgias.  Skin: Negative.   Neurological: Negative for dizziness and headaches.  Psychiatric/Behavioral: Negative for depression and memory loss. The patient does not have insomnia.     Past Medical History:  Diagnosis Date   Acute gastric ulcer without mention of hemorrhage,  perforation, or obstruction    Allergic rhinitis, cause unspecified    Anemia, unspecified    Arthritis    Asthma    Blood transfusion without reported diagnosis    CAD (coronary artery disease)    Diaphragmatic hernia without mention of obstruction or gangrene    Elevated prostate specific antigen (PSA)    Enlarged prostate    Esophageal reflux    Extrinsic asthma, unspecified    Herpes zoster without mention of complication    Hyperlipidemia    Hypersomnia with sleep apnea, unspecified    Impotence of organic origin    Kyphosis    Lumbago    Obstructive sleep apnea (adult) (pediatric)    Pneumonia    hx of years ago    Senile osteoporosis    Trigger finger (acquired)    Tubular adenoma of colon 05/2013   Unspecified essential hypertension    Unspecified sleep apnea    CPAP- settings 4-12    Unspecified vitamin D deficiency    Past Surgical History:  Procedure Laterality Date   CARDIAC CATHETERIZATION  03/04/2009   Dr [name]   COLONOSCOPY     CORONARY ARTERY BYPASS GRAFT  2010   CYSTOSCOPY WITH RETROGRADE PYELOGRAM, URETEROSCOPY AND STENT PLACEMENT Right 10/04/2013   Procedure: CYSTOSCOPY WITH RETROGRADE PYELOGRAM,  AND STENT PLACEMENT;  Surgeon: Festus Aloe, MD;  Location: [facility];  Service: Urology;  Laterality: Right;   MOLE REMOVAL  1958   chin   ROTATOR CUFF REPAIR Left 04/1998   with bone spur removed, Dr French Ana   TONSILLECTOMY  1945   TRANSURETHRAL RESECTION OF PROSTATE N/A 09/24/2013   Procedure: TRANSURETHRAL  RESECTION OF THE PROSTATE (TURP) WITH GYRUS (STAGED RIGHT LATERAL AND MEDIAN LOBE);  Surgeon: Ailene Rud, MD;  Location: [facility];  Service: Urology;  Laterality: N/A;   UPPER GI ENDOSCOPY     Social History:   reports that he has never smoked. He has never used smokeless tobacco. He reports that he does not drink alcohol and does not use drugs.  Family History  Problem Relation Age of Onset   Stroke  Mother    Breast cancer Mother    Cirrhosis Mother        wine   Rheumatic fever Mother    Kyphosis Mother    Hypertension Father    Stroke Father    Heart disease Father    Heart disease Paternal Grandfather    Colon cancer Neg Hx     Medications: Patient's Medications  New Prescriptions   No medications on file  Previous Medications   ACETAMINOPHEN (TYLENOL) 500 MG TABLET    Take 1,000 mg by mouth every 6 (six) hours as needed for mild pain or moderate pain.   ALBUTEROL (VENTOLIN HFA) 108 (90 BASE) MCG/ACT INHALER    Inhale 1-2 puffs into the lungs every 6 (six) hours as needed for wheezing or shortness of breath.   ASPIRIN EC 81 MG TABLET    Take 1 tablet (81 mg total) by mouth daily.   ASPIRIN-ACETAMINOPHEN-CAFFEINE (GOODYS EXTRA STRENGTH) 500-325-65 MG PACK    Take 1 Package by mouth as needed.   ASPIRIN-SALICYLAMIDE-CAFFEINE (BC HEADACHE POWDER PO)    Take 1 Package by mouth as needed.   ATENOLOL (TENORMIN) 50 MG TABLET    TAKE 1 TABLET BY MOUTH EVERY DAY TO CONTROL HEART RHYTHM AND LOWER BLOOD PRESSURE   B COMPLEX VITAMINS TABLET    Take 1 tablet by mouth daily.   CALCIUM CITRATE-VITAMIN D (EQ CALCIUM CITRATE+D3 PO)    Take 1,200 mg by mouth daily.   CHOLECALCIFEROL (VITAMIN D-3) 5000 UNITS TABS    Take 2 tablets by mouth daily.   COENZYME Q10 (COQ10) 200 MG CAPS    Take 1 capsule by mouth daily.    DOXAZOSIN (CARDURA) 4 MG TABLET    TAKE 1 TABLET DAILY TO HELP CONTROL BLOOD PRESSURE   FAMOTIDINE (PEPCID) 20 MG TABLET    Take 1 tablet (20 mg total) by mouth daily.   FEXOFENADINE (ALLEGRA) 180 MG TABLET    Take 180 mg by mouth as needed.    FINASTERIDE (PROSCAR) 5 MG TABLET    Take 5 mg by mouth every morning.   FOLIC ACID (FOLVITE) 1 MG TABLET    Take 1 tablet (1 mg total) by mouth daily.   MULTIPLE VITAMIN (MULTIVITAMIN) TABLET    Take 1 tablet by mouth daily.     PANTOPRAZOLE (PROTONIX) 40 MG TABLET    Take 1 tablet (40 mg total) by mouth daily.   PROBIOTIC  PRODUCT (PROBIOTIC DAILY PO)    Take 1 capsule by mouth daily.    ROSUVASTATIN (CRESTOR) 20 MG TABLET    TAKE ONE TABLET BY MOUTH ONCE DAILY FOR CHOLESTEROL  Modified Medications   No medications on file  Discontinued Medications   No medications on file    Physical Exam:  Vitals:   10/23/19 1402  BP: 112/66  Pulse: 65  Temp: (!) 96.9 F (36.1 C)  TempSrc: Temporal  SpO2: 96%  Weight: 142 lb (64.4 kg)  Height: 5\' 4"  (1.626 m)   Body mass index is 24.37 kg/m. Wt  Readings from Last 3 Encounters:  10/23/19 142 lb (64.4 kg)  08/31/19 146 lb 9.6 oz (66.5 kg)  04/24/19 142 lb (64.4 kg)    Physical Exam Constitutional:      General: He is not in acute distress.    Appearance: He is well-developed. He is not diaphoretic.  HENT:     Head: Normocephalic and atraumatic.     Mouth/Throat:     Pharynx: No oropharyngeal exudate.  Eyes:     Conjunctiva/sclera: Conjunctivae normal.     Pupils: Pupils are equal, round, and reactive to light.  Cardiovascular:     Rate and Rhythm: Normal rate and regular rhythm.     Heart sounds: Normal heart sounds.  Pulmonary:     Effort: Pulmonary effort is normal.     Breath sounds: Normal breath sounds.  Abdominal:     General: Bowel sounds are normal.     Palpations: Abdomen is soft.  Musculoskeletal:        General: No tenderness.     Cervical back: Normal range of motion and neck supple.  Skin:    General: Skin is warm and dry.  Neurological:     Mental Status: He is alert and oriented to person, place, and time.  Psychiatric:        Mood and Affect: Mood normal.        Behavior: Behavior normal.     Labs reviewed: Basic Metabolic Panel: Recent Labs    10/26/18 0438 04/14/19 1142 10/19/19 1206  NA 135 138 140  K 4.3 4.0 4.3  CL 103 104 107  CO2 24 26 24   GLUCOSE 116* 89 93  BUN 23 14 14   CREATININE 0.88 0.98 1.04  CALCIUM 9.4 9.5 8.9   Liver Function Tests: Recent Labs    10/26/18 0438 04/14/19 1142  10/19/19 1206  AST 32 20 17  ALT 27 13 15   ALKPHOS 34*  --   --   BILITOT 1.1 1.5* 1.1  PROT 6.8 6.8 6.4  ALBUMIN 4.0  --   --    Recent Labs    10/26/18 0438  LIPASE 47   No results for input(s): AMMONIA in the last 8760 hours. CBC: Recent Labs    10/26/18 0438 04/14/19 1142 10/19/19 1206  WBC 6.8 5.0 5.1  NEUTROABS 5.0 2,835 3,147  HGB 12.3* 12.6* 12.1*  HCT 36.0* 36.2* 35.6*  MCV 89.6 91.6 91.8  PLT 161 169 163   Lipid Panel: Recent Labs    04/14/19 1142  CHOL 118  HDL 60  LDLCALC 44  TRIG 63  CHOLHDL 2.0   TSH: No results for input(s): TSH in the last 8760 hours. A1C: Lab Results  Component Value Date   HGBA1C 5.6 07/28/2012     Assessment/Plan 1. Essential hypertension Controlled on atenolol, cardura  - COMPLETE METABOLIC PANEL WITH GFR; Future - CBC with Differential/Platelet; Future  2. Coronary artery disease involving native coronary artery of native heart without angina pectoris Stable, follows with cardiology routinely. Continues on atenolol and ASA 81 mg daily  - CBC with Differential/Platelet; Future - Lipid panel; Future  3. Seasonal and perennial allergic rhinitis Controlled on allegra  4. Obstructive sleep apnea Continues on CAPA  5. Gastroesophageal reflux disease, unspecified whether esophagitis present Stable, occasional flare when eating late or a heavy meal, diet information provided and encouraged lifestyle modifications to help with symptom management.   6. Benign prostatic hyperplasia with lower urinary tract symptoms, symptom details unspecified Stable continues to  follow up with urology.   7. Senile osteoporosis Completed fosamax, continues on cal and vit d, followed by ortho  Next appt: 6 months labs before  Brent Griffith K. [place], [facility] [phone]

## 2019-10-29 DIAGNOSIS — H40013 Open angle with borderline findings, low risk, bilateral: Secondary | ICD-10-CM | POA: Diagnosis not present

## 2019-10-29 DIAGNOSIS — D3131 Benign neoplasm of right choroid: Secondary | ICD-10-CM | POA: Diagnosis not present

## 2019-10-29 DIAGNOSIS — H10413 Chronic giant papillary conjunctivitis, bilateral: Secondary | ICD-10-CM | POA: Diagnosis not present

## 2019-10-29 DIAGNOSIS — H04123 Dry eye syndrome of bilateral lacrimal glands: Secondary | ICD-10-CM | POA: Diagnosis not present

## 2019-10-29 DIAGNOSIS — H2513 Age-related nuclear cataract, bilateral: Secondary | ICD-10-CM | POA: Diagnosis not present

## 2019-11-02 ENCOUNTER — Ambulatory Visit (INDEPENDENT_AMBULATORY_CARE_PROVIDER_SITE_OTHER): Payer: Medicare Other | Admitting: Nurse Practitioner

## 2019-11-02 ENCOUNTER — Telehealth: Payer: Self-pay

## 2019-11-02 ENCOUNTER — Encounter: Payer: Self-pay | Admitting: Nurse Practitioner

## 2019-11-02 ENCOUNTER — Other Ambulatory Visit: Payer: Self-pay

## 2019-11-02 VITALS — Ht 64.0 in | Wt 138.5 lb

## 2019-11-02 DIAGNOSIS — Z0000 Encounter for general adult medical examination without abnormal findings: Secondary | ICD-10-CM | POA: Diagnosis not present

## 2019-11-02 NOTE — Telephone Encounter (Signed)
Mr. Brent Griffith, Brent Griffith are scheduled for a virtual visit with your provider today.    Just as we do with appointments in the office, we must obtain your consent to participate.  Your consent will be active for this visit and any virtual visit you may have with one of our providers in the next 365 days.    If you have a MyChart account, I can also send a copy of this consent to you electronically.  All virtual visits are billed to your insurance company just like a traditional visit in the office.  As this is a virtual visit, video technology does not allow for your provider to perform a traditional examination.  This may limit your provider's ability to fully assess your condition.  If your provider identifies any concerns that need to be evaluated in person or the need to arrange testing such as labs, EKG, etc, we will make arrangements to do so.    Although advances in technology are sophisticated, we cannot ensure that it will always work on either your end or our end.  If the connection with a video visit is poor, we may have to switch to a telephone visit.  With either a video or telephone visit, we are not always able to ensure that we have a secure connection.   I need to obtain your verbal consent now.   Are you willing to proceed with your visit today?   Brent Griffith has provided verbal consent on 11/02/2019 for a virtual visit (video or telephone).   Tanna Savoy, [place] 11/02/2019  11:27 AM

## 2019-11-02 NOTE — Patient Instructions (Signed)
Mr. Brent Griffith , Thank you for taking time to come for your Medicare Wellness Visit. I appreciate your ongoing commitment to your health goals. Please review the following plan we discussed and let me know if I can assist you in the future.   Screening recommendations/referrals: Colonoscopy aged out Recommended yearly ophthalmology/optometry visit for glaucoma screening and checkup Recommended yearly dental visit for hygiene and checkup  Vaccinations: Influenza vaccine up to date Pneumococcal vaccine up to date Tdap vaccine up to date Shingles vaccine TO GET SECOND VACCINE AT [place]     Advanced directives: recommend to complete MOST form.   Conditions/risks identified: advance age, CAD, hyperlipidemia, hypertension.  Next appointment: 1 year.   Preventive Care 82 Years and Older, Male Preventive care refers to lifestyle choices and visits with your health care provider that can promote health and wellness. What does preventive care include?  A yearly physical exam. This is also called an annual well check.  Dental exams once or twice a year.  Routine eye exams. Ask your health care provider how often you should have your eyes checked.  Personal lifestyle choices, including:  Daily care of your teeth and gums.  Regular physical activity.  Eating a healthy diet.  Avoiding tobacco and drug use.  Limiting alcohol use.  Practicing safe sex.  Taking low doses of aspirin every day.  Taking vitamin and mineral supplements as recommended by your health care provider. What happens during an annual well check? The services and screenings done by your health care provider during your annual well check will depend on your age, overall health, lifestyle risk factors, and family history of disease. Counseling  Your health care provider may ask you questions about your:  Alcohol use.  Tobacco use.  Drug use.  Emotional well-being.  Home and relationship  well-being.  Sexual activity.  Eating habits.  History of falls.  Memory and ability to understand (cognition).  Work and work Statistician. Screening  You may have the following tests or measurements:  Height, weight, and BMI.  Blood pressure.  Lipid and cholesterol levels. These may be checked every 5 years, or more frequently if you are over 6 years old.  Skin check.  Lung cancer screening. You may have this screening every year starting at age 62 if you have a 30-pack-year history of smoking and currently smoke or have quit within the past 15 years.  Fecal occult blood test (FOBT) of the stool. You may have this test every year starting at age 62.  Flexible sigmoidoscopy or colonoscopy. You may have a sigmoidoscopy every 5 years or a colonoscopy every 10 years starting at age 24.  Prostate cancer screening. Recommendations will vary depending on your family history and other risks.  Hepatitis C blood test.  Hepatitis B blood test.  Sexually transmitted disease (STD) testing.  Diabetes screening. This is done by checking your blood sugar (glucose) after you have not eaten for a while (fasting). You may have this done every 1-3 years.  Abdominal aortic aneurysm (AAA) screening. You may need this if you are a current or former smoker.  Osteoporosis. You may be screened starting at age 24 if you are at high risk. Talk with your health care provider about your test results, treatment options, and if necessary, the need for more tests. Vaccines  Your health care provider may recommend certain vaccines, such as:  Influenza vaccine. This is recommended every year.  Tetanus, diphtheria, and acellular pertussis (Tdap, Td) vaccine. You may  need a Td booster every 10 years.  Zoster vaccine. You may need this after age 76.  Pneumococcal 13-valent conjugate (PCV13) vaccine. One dose is recommended after age 76.  Pneumococcal polysaccharide (PPSV23) vaccine. One dose is  recommended after age 17. Talk to your health care provider about which screenings and vaccines you need and how often you need them. This information is not intended to replace advice given to you by your health care provider. Make sure you discuss any questions you have with your health care provider. Document Released: 05/20/2015 Document Revised: 01/11/2016 Document Reviewed: 02/22/2015 Elsevier Interactive Patient Education  2017 [place] Prevention in the Home Falls can cause injuries. They can happen to people of all ages. There are many things you can do to make your home safe and to help prevent falls. What can I do on the outside of my home?  Regularly fix the edges of walkways and driveways and fix any cracks.  Remove anything that might make you trip as you walk through a door, such as a raised step or threshold.  Trim any bushes or trees on the path to your home.  Use bright outdoor lighting.  Clear any walking paths of anything that might make someone trip, such as rocks or tools.  Regularly check to see if handrails are loose or broken. Make sure that both sides of any steps have handrails.  Any raised decks and porches should have guardrails on the edges.  Have any leaves, snow, or ice cleared regularly.  Use sand or salt on walking paths during winter.  Clean up any spills in your garage right away. This includes oil or grease spills. What can I do in the bathroom?  Use night lights.  Install grab bars by the toilet and in the tub and shower. Do not use towel bars as grab bars.  Use non-skid mats or decals in the tub or shower.  If you need to sit down in the shower, use a plastic, non-slip stool.  Keep the floor dry. Clean up any water that spills on the floor as soon as it happens.  Remove soap buildup in the tub or shower regularly.  Attach bath mats securely with double-sided non-slip rug tape.  Do not have throw rugs and other things on  the floor that can make you trip. What can I do in the bedroom?  Use night lights.  Make sure that you have a light by your bed that is easy to reach.  Do not use any sheets or blankets that are too big for your bed. They should not hang down onto the floor.  Have a firm chair that has side arms. You can use this for support while you get dressed.  Do not have throw rugs and other things on the floor that can make you trip. What can I do in the kitchen?  Clean up any spills right away.  Avoid walking on wet floors.  Keep items that you use a lot in easy-to-reach places.  If you need to reach something above you, use a strong step stool that has a grab bar.  Keep electrical cords out of the way.  Do not use floor polish or wax that makes floors slippery. If you must use wax, use non-skid floor wax.  Do not have throw rugs and other things on the floor that can make you trip. What can I do with my stairs?  Do not leave any items on  the stairs.  Make sure that there are handrails on both sides of the stairs and use them. Fix handrails that are broken or loose. Make sure that handrails are as long as the stairways.  Check any carpeting to make sure that it is firmly attached to the stairs. Fix any carpet that is loose or worn.  Avoid having throw rugs at the top or bottom of the stairs. If you do have throw rugs, attach them to the floor with carpet tape.  Make sure that you have a light switch at the top of the stairs and the bottom of the stairs. If you do not have them, ask someone to add them for you. What else can I do to help prevent falls?  Wear shoes that:  Do not have high heels.  Have rubber bottoms.  Are comfortable and fit you well.  Are closed at the toe. Do not wear sandals.  If you use a stepladder:  Make sure that it is fully opened. Do not climb a closed stepladder.  Make sure that both sides of the stepladder are locked into place.  Ask someone to  hold it for you, if possible.  Clearly mark and make sure that you can see:  Any grab bars or handrails.  First and last steps.  Where the edge of each step is.  Use tools that help you move around (mobility aids) if they are needed. These include:  Canes.  Walkers.  Scooters.  Crutches.  Turn on the lights when you go into a dark area. Replace any light bulbs as soon as they burn out.  Set up your furniture so you have a clear path. Avoid moving your furniture around.  If any of your floors are uneven, fix them.  If there are any pets around you, be aware of where they are.  Review your medicines with your doctor. Some medicines can make you feel dizzy. This can increase your chance of falling. Ask your doctor what other things that you can do to help prevent falls. This information is not intended to replace advice given to you by your health care provider. Make sure you discuss any questions you have with your health care provider. Document Released: 02/17/2009 Document Revised: 09/29/2015 Document Reviewed: 05/28/2014 Elsevier Interactive Patient Education  2017 [employer].

## 2019-11-02 NOTE — Progress Notes (Signed)
This service is provided via telemedicine  No vital signs collected/recorded due to the encounter was a telemedicine visit.   Location of patient (ex: home, work): home   Patient consents to a telephone visit:  yes  Location of the provider (ex: office, home):[facility]  Name of any referring provider: [name] NP   Names of all persons participating in the telemedicine service and their role in the encounter:  Sherrie Mustache,    Subjective:   Brent Griffith is a 84 y.o. male who presents for Medicare Annual/Subsequent preventive examination.  Review of Systems     Cardiac Risk Factors include: advanced age (>17men, >39 women);hypertension;dyslipidemia;male gender     Objective:    Today's Vitals   11/02/19 1246  Weight: 138 lb 8 oz (62.8 kg)  Height: 5\' 4"  (1.626 m)   Body mass index is 23.77 kg/m.  Advanced Directives 10/23/2019 10/30/2018 10/26/2018 10/11/2017 04/10/2017 01/28/2017 12/28/2016  Does Patient Have a Medical Advance Directive? No No No No No No No  Would patient like information on creating a medical advance directive? No - Patient declined No - Patient declined No - Patient declined - No - Patient declined No - Patient declined -  Pre-existing out of facility DNR order (yellow form or pink MOST form) - - - - - - -    Current Medications (verified) Outpatient Encounter Medications as of 11/02/2019  Medication Sig  . acetaminophen (TYLENOL) 500 MG tablet Take 1,000 mg by mouth every 6 (six) hours as needed for mild pain or moderate pain.  Marland Kitchen albuterol (VENTOLIN HFA) 108 (90 Base) MCG/ACT inhaler Inhale 1-2 puffs into the lungs every 6 (six) hours as needed for wheezing or shortness of breath.  Marland Kitchen aspirin EC 81 MG tablet Take 1 tablet (81 mg total) by mouth daily.  . Aspirin-Acetaminophen-Caffeine (GOODYS EXTRA STRENGTH) [phone] MG PACK Take 1 Package by mouth as needed.  . Aspirin-Salicylamide-Caffeine (BC HEADACHE POWDER PO) Take 1 Package by mouth as needed.   Marland Kitchen atenolol (TENORMIN) 50 MG tablet TAKE 1 TABLET BY MOUTH EVERY DAY TO CONTROL HEART RHYTHM AND LOWER BLOOD PRESSURE  . b complex vitamins tablet Take 1 tablet by mouth daily.  . Calcium Citrate-Vitamin D (EQ CALCIUM CITRATE+D3 PO) Take 1,200 mg by mouth daily.  . Cholecalciferol (VITAMIN D-3) 5000 units TABS Take 2 tablets by mouth daily.  . Coenzyme Q10 (COQ10) 200 MG CAPS Take 1 capsule by mouth daily.   Marland Kitchen doxazosin (CARDURA) 4 MG tablet TAKE 1 TABLET DAILY TO HELP CONTROL BLOOD PRESSURE  . famotidine (PEPCID) 20 MG tablet Take 1 tablet (20 mg total) by mouth daily.  . fexofenadine (ALLEGRA) 180 MG tablet Take 180 mg by mouth as needed.   . finasteride (PROSCAR) 5 MG tablet Take 5 mg by mouth every morning.  . folic acid (FOLVITE) 1 MG tablet Take 1 tablet (1 mg total) by mouth daily.  . Multiple Vitamin (MULTIVITAMIN) tablet Take 1 tablet by mouth daily.    . pantoprazole (PROTONIX) 40 MG tablet Take 1 tablet (40 mg total) by mouth daily.  . Probiotic Product (PROBIOTIC DAILY PO) Take 1 capsule by mouth daily.   . rosuvastatin (CRESTOR) 20 MG tablet TAKE ONE TABLET BY MOUTH ONCE DAILY FOR CHOLESTEROL   No facility-administered encounter medications on file as of 11/02/2019.    Allergies (verified) Compazine [prochlorperazine edisylate]   History: Past Medical History:  Diagnosis Date  . Acute gastric ulcer without mention of hemorrhage, perforation, or obstruction   .  Allergic rhinitis, cause unspecified   . Anemia, unspecified   . Arthritis   . Asthma   . Blood transfusion without reported diagnosis   . CAD (coronary artery disease)   . Diaphragmatic hernia without mention of obstruction or gangrene   . Elevated prostate specific antigen (PSA)   . Enlarged prostate   . Esophageal reflux   . Extrinsic asthma, unspecified   . Herpes zoster without mention of complication   . Hyperlipidemia   . Hypersomnia with sleep apnea, unspecified   . Impotence of organic origin   .  Kyphosis   . Lumbago   . Obstructive sleep apnea (adult) (pediatric)   . Pneumonia    hx of years ago   . Senile osteoporosis   . Trigger finger (acquired)   . Tubular adenoma of colon 05/2013  . Unspecified essential hypertension   . Unspecified sleep apnea    CPAP- settings 4-12   . Unspecified vitamin D deficiency    Past Surgical History:  Procedure Laterality Date  . CARDIAC CATHETERIZATION  03/04/2009   Dr [name]  . COLONOSCOPY    . CORONARY ARTERY BYPASS GRAFT  2010  . CYSTOSCOPY WITH RETROGRADE PYELOGRAM, URETEROSCOPY AND STENT PLACEMENT Right 10/04/2013   Procedure: CYSTOSCOPY WITH RETROGRADE PYELOGRAM,  AND STENT PLACEMENT;  Surgeon: Festus Aloe, MD;  Location: [facility];  Service: Urology;  Laterality: Right;  . MOLE REMOVAL  1958   chin  . ROTATOR CUFF REPAIR Left 04/1998   with bone spur removed, Dr French Ana  . TONSILLECTOMY  1945  . TRANSURETHRAL RESECTION OF PROSTATE N/A 09/24/2013   Procedure: TRANSURETHRAL RESECTION OF THE PROSTATE (TURP) WITH GYRUS (STAGED RIGHT LATERAL AND MEDIAN LOBE);  Surgeon: Ailene Rud, MD;  Location: [facility];  Service: Urology;  Laterality: N/A;  . UPPER GI ENDOSCOPY     Family History  Problem Relation Age of Onset  . Stroke Mother   . Breast cancer Mother   . Cirrhosis Mother        wine  . Rheumatic fever Mother   . Kyphosis Mother   . Hypertension Father   . Stroke Father   . Heart disease Father   . Heart disease Paternal Grandfather   . Colon cancer Neg Hx    Social History   Socioeconomic History  . Marital status: Married    Spouse name: Not on file  . Number of children: 0  . Years of education: Not on file  . Highest education level: Not on file  Occupational History  . Occupation: [employer]  Tobacco Use  . Smoking status: Never Smoker  . Smokeless tobacco: Never Used  Vaping Use  . Vaping Use: Never used  Substance and Sexual Activity  . Alcohol use: No  . Drug use: No  .  Sexual activity: Not Currently  Other Topics Concern  . Not on file  Social History Narrative  . Not on file   Social Determinants of Health   Financial Resource Strain:   . Difficulty of Paying Living Expenses:   Food Insecurity:   . Worried About Charity fundraiser in the Last Year:   . Arboriculturist in the Last Year:   Transportation Needs:   . Film/video editor (Medical):   Marland Kitchen Lack of Transportation (Non-Medical):   Physical Activity:   . Days of Exercise per Week:   . Minutes of Exercise per Session:   Stress:   . Feeling of Stress :  Social Connections:   . Frequency of Communication with Friends and Family:   . Frequency of Social Gatherings with Friends and Family:   . Attends Religious Services:   . Active Member of Clubs or Organizations:   . Attends Archivist Meetings:   Marland Kitchen Marital Status:     Tobacco Counseling Counseling given: Not Answered   Clinical Intake:  Pre-visit preparation completed: Yes  Pain : No/denies pain     BMI - recorded: 23.77 Nutritional Status: BMI of 19-24  Normal Nutritional Risks: None Diabetes: No  How often do you need to have someone help you when you read instructions, pamphlets, or other written materials from your doctor or pharmacy?: 1 - Never  Diabetic?no          Activities of Daily Living In your present state of health, do you have any difficulty performing the following activities: 11/02/2019  Hearing? N  Vision? N  Difficulty concentrating or making decisions? N  Walking or climbing stairs? N  Dressing or bathing? N  Doing errands, shopping? N  Preparing Food and eating ? N  Using the Toilet? N  In the past six months, have you accidently leaked urine? N  Do you have problems with loss of bowel control? N  Managing your Medications? N  Managing your Finances? N  Housekeeping or managing your Housekeeping? N  Some recent data might be hidden    Patient Care Team: [name], NP as PCP - General (Geriatric Medicine) [name], MD as Consulting Physician (Sports Medicine)  Indicate any recent Medical Services you may have received from other than [facility] providers in the past year (date may be approximate).     Assessment:   This is a routine wellness examination for [place].  Hearing/Vision screen No exam data present  Dietary issues and exercise activities discussed: Current Exercise Habits: The patient does not participate in regular exercise at present  Goals    . Exercise 3x per week (30 min per time)     I will like to exercise at least 2-3 times per week at the [facility] or [facility]       Depression Screen PHQ 2/9 Scores 10/30/2018 10/21/2018 04/21/2018 10/09/2016 07/13/2015 06/09/2014 02/09/2014  PHQ - 2 Score 0 0 0 0 0 0 0    Fall Risk Fall Risk  11/02/2019 10/23/2019 04/24/2019 10/30/2018 10/21/2018  Falls in the past year? 0 0 1 0 0  Number falls in past yr: 0 0 0 0 -  Injury with Fall? - 0 0 0 0    Any stairs in or around the home? Yes  If so, are there any without handrails? Yes  Home free of loose throw rugs in walkways, pet beds, electrical cords, etc? Yes  Adequate lighting in your home to reduce risk of falls? Yes   ASSISTIVE DEVICES UTILIZED TO PREVENT FALLS:  Life alert? No  Use of a cane, walker or w/c? No  Grab bars in the bathroom? No  Shower chair or bench in shower? Yes  Elevated toilet seat or a handicapped toilet? Yes   TIMED UP AND GO:  na  Cognitive Function:     6CIT Screen 10/30/2018  What Year? 0 points  What month? 0 points  What time? 0 points  Count back from 20 0 points  Months in reverse 0 points  Repeat phrase 0 points  Total Score 0    Immunizations Immunization History  Administered Date(s) Administered  .  Influenza Split 01/30/2012, 02/10/2014  . Influenza Whole 02/17/2009, 02/18/2013  . Influenza, High Dose Seasonal PF 03/19/2018, 02/02/2019  . Influenza, Quadrivalent, Recombinant, Inj, Pf  02/23/2017  . Influenza,inj,Quad PF,6+ Mos 02/23/2017  . Influenza-Unspecified 02/08/2014, 02/24/2015, 03/16/2016  . Moderna SARS-COVID-2 Vaccination 07/21/2019, 08/18/2019  . Pneumococcal Conjugate-13 04/09/2013  . Pneumococcal Polysaccharide-23 10/18/2004  . Td 09/29/1990  . Tdap 07/25/2011  . Zoster Recombinat (Shingrix) 10/09/2016    TDAP status: Up to date Flu Vaccine status: Up to date Pneumococcal vaccine status: Up to date Covid-19 vaccine status: Completed vaccines  Qualifies for Shingles Vaccine? No   Zostavax completed No   Shingrix Completed?: Yes  Screening Tests Health Maintenance  Topic Date Due  . INFLUENZA VACCINE  12/06/2019  . COLONOSCOPY  05/15/2020  . TETANUS/TDAP  07/24/2021  . COVID-19 Vaccine  Completed  . PNA vac Low Risk Adult  Completed    Health Maintenance  There are no preventive care reminders to display for this patient.  Colorectal cancer screening: No longer required.   Lung Cancer Screening: (Low Dose CT Chest recommended if Age 30-80 years, 30 pack-year currently smoking OR have quit w/in 15years.) does not qualify.   Lung Cancer Screening Referral: na  Additional Screening:  Hepatitis C Screening: does not qualify  Vision Screening: Recommended annual ophthalmology exams for early detection of glaucoma and other disorders of the eye. Is the patient up to date with their annual eye exam?  Yes  Who is the provider or what is the name of the office in which the patient attends annual eye exams? Dr Katy Fitch If pt is not established with a provider, would they like to be referred to a provider to establish care? No .   Dental Screening: Recommended annual dental exams for proper oral hygiene  Community Resource Referral / Chronic Care Management: CRR required this visit?  No   CCM required this visit?  No      Plan:     I have personally reviewed and noted the following in the patient's chart:   . Medical and social  history . Use of alcohol, tobacco or illicit drugs  . Current medications and supplements . Functional ability and status . Nutritional status . Physical activity . Advanced directives . List of other physicians . Hospitalizations, surgeries, and ER visits in previous 12 months . Vitals . Screenings to include cognitive, depression, and falls . Referrals and appointments  In addition, I have reviewed and discussed with patient certain preventive protocols, quality metrics, and best practice recommendations. A written personalized care plan for preventive services as well as general preventive health recommendations were provided to patient.     [name], NP   11/02/2019

## 2019-11-03 NOTE — Progress Notes (Signed)
HPI: FU coronary artery disease.Cardiac catheterization on March 04, 2009 revealed severe three-vessel coronary disease as well as an 80% left main. His ejection fraction was 50%. He ultimately underwent coronary artery bypass and graft on November 1 (left internal mammary artery to left anterior descending, sequential saphenous vein graft to ramus intermedius and obtuse marginal 2, saphenous vein graft to posterior descending. Echocardiogram November 2015 showed normal LV function, grade 1 diastolic dysfunction and moderate left atrial enlargement.Nuclear study April 2018 showed ejection fraction 48%. There was prior inferolateral infarct and mild apical ischemia. Since he was last seen,  there is no dyspnea, chest pain, palpitations or syncope.  Current Outpatient Medications  Medication Sig Dispense Refill  . acetaminophen (TYLENOL) 500 MG tablet Take 1,000 mg by mouth every 6 (six) hours as needed for mild pain or moderate pain.    Marland Kitchen albuterol (VENTOLIN HFA) 108 (90 Base) MCG/ACT inhaler Inhale 1-2 puffs into the lungs every 6 (six) hours as needed for wheezing or shortness of breath. 8 g 1  . aspirin EC 81 MG tablet Take 1 tablet (81 mg total) by mouth daily. 90 tablet 3  . Aspirin-Acetaminophen-Caffeine (GOODYS EXTRA STRENGTH) 500-325-65 MG PACK Take 1 Package by mouth as needed.    . Aspirin-Salicylamide-Caffeine (BC HEADACHE POWDER PO) Take 1 Package by mouth as needed.    Marland Kitchen atenolol (TENORMIN) 50 MG tablet TAKE 1 TABLET BY MOUTH EVERY DAY TO CONTROL HEART RHYTHM AND LOWER BLOOD PRESSURE 90 tablet 1  . b complex vitamins tablet Take 1 tablet by mouth daily.    . Calcium Citrate-Vitamin D (EQ CALCIUM CITRATE+D3 PO) Take 1,200 mg by mouth daily.    . Cholecalciferol (VITAMIN D-3) 5000 units TABS Take 2 tablets by mouth daily.    . Coenzyme Q10 (COQ10) 200 MG CAPS Take 1 capsule by mouth daily.     Marland Kitchen doxazosin (CARDURA) 4 MG tablet TAKE 1 TABLET DAILY TO HELP CONTROL BLOOD PRESSURE  90 tablet 1  . famotidine (PEPCID) 20 MG tablet Take 1 tablet (20 mg total) by mouth daily. 90 tablet 1  . fexofenadine (ALLEGRA) 180 MG tablet Take 180 mg by mouth as needed.     . finasteride (PROSCAR) 5 MG tablet Take 5 mg by mouth every morning.    . folic acid (FOLVITE) 1 MG tablet Take 1 tablet (1 mg total) by mouth daily. 90 tablet 1  . Multiple Vitamin (MULTIVITAMIN) tablet Take 1 tablet by mouth daily.      . pantoprazole (PROTONIX) 40 MG tablet Take 1 tablet (40 mg total) by mouth daily. 90 tablet 1  . Probiotic Product (PROBIOTIC DAILY PO) Take 1 capsule by mouth daily.     . rosuvastatin (CRESTOR) 20 MG tablet TAKE ONE TABLET BY MOUTH ONCE DAILY FOR CHOLESTEROL 90 tablet 1   No current facility-administered medications for this visit.     Past Medical History:  Diagnosis Date  . Acute gastric ulcer without mention of hemorrhage, perforation, or obstruction   . Allergic rhinitis, cause unspecified   . Anemia, unspecified   . Arthritis   . Asthma   . Blood transfusion without reported diagnosis   . CAD (coronary artery disease)   . Diaphragmatic hernia without mention of obstruction or gangrene   . Elevated prostate specific antigen (PSA)   . Enlarged prostate   . Esophageal reflux   . Extrinsic asthma, unspecified   . Herpes zoster without mention of complication   . Hyperlipidemia   .  Hypersomnia with sleep apnea, unspecified   . Impotence of organic origin   . Kyphosis   . Lumbago   . Obstructive sleep apnea (adult) (pediatric)   . Pneumonia    hx of years ago   . Senile osteoporosis   . Trigger finger (acquired)   . Tubular adenoma of colon 05/2013  . Unspecified essential hypertension   . Unspecified sleep apnea    CPAP- settings 4-12   . Unspecified vitamin D deficiency     Past Surgical History:  Procedure Laterality Date  . CARDIAC CATHETERIZATION  03/04/2009   Dr [name]  . COLONOSCOPY    . CORONARY ARTERY BYPASS GRAFT  2010  . CYSTOSCOPY  WITH RETROGRADE PYELOGRAM, URETEROSCOPY AND STENT PLACEMENT Right 10/04/2013   Procedure: CYSTOSCOPY WITH RETROGRADE PYELOGRAM,  AND STENT PLACEMENT;  Surgeon: Festus Aloe, MD;  Location: [facility];  Service: Urology;  Laterality: Right;  . MOLE REMOVAL  1958   chin  . ROTATOR CUFF REPAIR Left 04/1998   with bone spur removed, Dr French Ana  . TONSILLECTOMY  1945  . TRANSURETHRAL RESECTION OF PROSTATE N/A 09/24/2013   Procedure: TRANSURETHRAL RESECTION OF THE PROSTATE (TURP) WITH GYRUS (STAGED RIGHT LATERAL AND MEDIAN LOBE);  Surgeon: Ailene Rud, MD;  Location: [facility];  Service: Urology;  Laterality: N/A;  . UPPER GI ENDOSCOPY      Social History   Socioeconomic History  . Marital status: Married    Spouse name: Not on file  . Number of children: 0  . Years of education: Not on file  . Highest education level: Not on file  Occupational History  . Occupation: [employer]  Tobacco Use  . Smoking status: Never Smoker  . Smokeless tobacco: Never Used  Vaping Use  . Vaping Use: Never used  Substance and Sexual Activity  . Alcohol use: No  . Drug use: No  . Sexual activity: Not Currently  Other Topics Concern  . Not on file  Social History Narrative  . Not on file   Social Determinants of Health   Financial Resource Strain:   . Difficulty of Paying Living Expenses:   Food Insecurity:   . Worried About Charity fundraiser in the Last Year:   . Arboriculturist in the Last Year:   Transportation Needs:   . Film/video editor (Medical):   Marland Kitchen Lack of Transportation (Non-Medical):   Physical Activity:   . Days of Exercise per Week:   . Minutes of Exercise per Session:   Stress:   . Feeling of Stress :   Social Connections:   . Frequency of Communication with Friends and Family:   . Frequency of Social Gatherings with Friends and Family:   . Attends Religious Services:   . Active Member of Clubs or Organizations:   . Attends Archivist  Meetings:   Marland Kitchen Marital Status:   Intimate Partner Violence:   . Fear of Current or Ex-Partner:   . Emotionally Abused:   Marland Kitchen Physically Abused:   . Sexually Abused:     Family History  Problem Relation Age of Onset  . Stroke Mother   . Breast cancer Mother   . Cirrhosis Mother        wine  . Rheumatic fever Mother   . Kyphosis Mother   . Hypertension Father   . Stroke Father   . Heart disease Father   . Heart disease Paternal Grandfather   . Colon cancer Neg Hx  ROS: no fevers or chills, productive cough, hemoptysis, dysphasia, odynophagia, melena, hematochezia, dysuria, hematuria, rash, seizure activity, orthopnea, PND, pedal edema, claudication. Remaining systems are negative.  Physical Exam: Well-developed well-nourished in no acute distress.  Skin is warm and dry.  HEENT is normal.  Neck is supple.  Chest is clear to auscultation with normal expansion.  Cardiovascular exam is regular rate and rhythm.  Abdominal exam nontender or distended. No masses palpated. Extremities show no edema. neuro grossly intact  ECG-sinus rhythm at a rate of 58, inferolateral T wave changes.  Personally reviewed  A/P  1 coronary artery disease-no recurrent chest pain.  Continue aspirin and statin.  2 hypertension-blood pressure controlled.  Continue present medications.  3 hyperlipidemia-continue statin.  Kirk Ruths, MD

## 2019-11-11 ENCOUNTER — Encounter: Payer: Self-pay | Admitting: Cardiology

## 2019-11-11 ENCOUNTER — Ambulatory Visit: Payer: Medicare Other | Admitting: Cardiology

## 2019-11-11 ENCOUNTER — Other Ambulatory Visit: Payer: Self-pay

## 2019-11-11 VITALS — BP 126/64 | HR 58 | Ht 64.0 in | Wt 144.0 lb

## 2019-11-11 DIAGNOSIS — I2510 Atherosclerotic heart disease of native coronary artery without angina pectoris: Secondary | ICD-10-CM | POA: Diagnosis not present

## 2019-11-11 DIAGNOSIS — I10 Essential (primary) hypertension: Secondary | ICD-10-CM | POA: Diagnosis not present

## 2019-11-11 DIAGNOSIS — E7800 Pure hypercholesterolemia, unspecified: Secondary | ICD-10-CM

## 2019-11-11 NOTE — Patient Instructions (Signed)
Medication Instructions:  NO CHANGE *If you need a refill on your cardiac medications before your next appointment, please call your pharmacy*   Lab Work: If you have labs (blood work) drawn today and your tests are completely normal, you will receive your results only by: . MyChart Message (if you have MyChart) OR . A paper copy in the mail If you have any lab test that is abnormal or we need to change your treatment, we will call you to review the results.   Follow-Up: At [facility], you and your health needs are our priority.  As part of our continuing mission to provide you with exceptional heart care, we have created designated Provider Care Teams.  These Care Teams include your primary Cardiologist (physician) and Advanced Practice Providers (APPs -  Physician Assistants and Nurse Practitioners) who all work together to provide you with the care you need, when you need it.  We recommend signing up for the patient portal called "MyChart".  Sign up information is provided on this After Visit Summary.  MyChart is used to connect with patients for Virtual Visits (Telemedicine).  Patients are able to view lab/test results, encounter notes, upcoming appointments, etc.  Non-urgent messages can be sent to your provider as well.   To learn more about what you can do with MyChart, go to [email or web address].    Your next appointment:   12 month(s)  The format for your next appointment:   In Person  Provider:   Brian Crenshaw, MD    

## 2020-01-01 ENCOUNTER — Other Ambulatory Visit: Payer: Self-pay | Admitting: *Deleted

## 2020-01-01 MED ORDER — ALBUTEROL SULFATE HFA 108 (90 BASE) MCG/ACT IN AERS: 1.0000 | INHALATION_SPRAY | Freq: Four times a day (QID) | RESPIRATORY_TRACT | 1 refills | Status: DC | PRN

## 2020-01-01 NOTE — Telephone Encounter (Signed)
[facility] sent Fax stating that Ventolin is NOT covered under Insurance coverage.  Requesting alternative ProAir HFA  Jessica Approved and asked me to send to pharmacy.  Medication list updated and Rx faxed.

## 2020-01-25 ENCOUNTER — Ambulatory Visit (INDEPENDENT_AMBULATORY_CARE_PROVIDER_SITE_OTHER): Payer: Medicare Other

## 2020-01-25 ENCOUNTER — Other Ambulatory Visit: Payer: Self-pay

## 2020-01-25 DIAGNOSIS — Z23 Encounter for immunization: Secondary | ICD-10-CM

## 2020-01-27 ENCOUNTER — Ambulatory Visit: Payer: Medicare Other

## 2020-02-24 ENCOUNTER — Encounter (HOSPITAL_BASED_OUTPATIENT_CLINIC_OR_DEPARTMENT_OTHER): Payer: Self-pay

## 2020-02-24 ENCOUNTER — Other Ambulatory Visit: Payer: Self-pay

## 2020-02-24 ENCOUNTER — Emergency Department (HOSPITAL_BASED_OUTPATIENT_CLINIC_OR_DEPARTMENT_OTHER)
Admission: EM | Admit: 2020-02-24 | Discharge: 2020-02-24 | Disposition: A | Discharge status: Home / Self Care | Payer: Medicare Other | Attending: Emergency Medicine | Admitting: Emergency Medicine

## 2020-02-24 ENCOUNTER — Emergency Department (HOSPITAL_BASED_OUTPATIENT_CLINIC_OR_DEPARTMENT_OTHER): Payer: Medicare Other

## 2020-02-24 DIAGNOSIS — I2510 Atherosclerotic heart disease of native coronary artery without angina pectoris: Secondary | ICD-10-CM | POA: Diagnosis not present

## 2020-02-24 DIAGNOSIS — Z951 Presence of aortocoronary bypass graft: Secondary | ICD-10-CM | POA: Insufficient documentation

## 2020-02-24 DIAGNOSIS — W01198A Fall on same level from slipping, tripping and stumbling with subsequent striking against other object, initial encounter: Secondary | ICD-10-CM | POA: Diagnosis not present

## 2020-02-24 DIAGNOSIS — I119 Hypertensive heart disease without heart failure: Secondary | ICD-10-CM | POA: Insufficient documentation

## 2020-02-24 DIAGNOSIS — J45909 Unspecified asthma, uncomplicated: Secondary | ICD-10-CM | POA: Insufficient documentation

## 2020-02-24 DIAGNOSIS — S0990XA Unspecified injury of head, initial encounter: Secondary | ICD-10-CM

## 2020-02-24 DIAGNOSIS — W19XXXA Unspecified fall, initial encounter: Secondary | ICD-10-CM

## 2020-02-24 DIAGNOSIS — R519 Headache, unspecified: Secondary | ICD-10-CM | POA: Diagnosis not present

## 2020-02-24 NOTE — ED Triage Notes (Signed)
Pt states he tripped/fell ~1pm-hit head on driveway-no-LOC-hematoma to right forehead area-NAD-steady gait

## 2020-02-24 NOTE — Discharge Instructions (Addendum)
You were evaluated in the Emergency Department and after careful evaluation, we did not find any emergent condition requiring admission or further testing in the hospital.  Your exam/testing today was overall reassuring.  CT scan did not show any bleeding on the brain.  Please return to the Emergency Department if you experience any worsening of your condition.  Thank you for allowing [place] to be a part of your care.

## 2020-02-24 NOTE — ED Provider Notes (Signed)
[facility] Emergency Department Provider Note MRN:  [id number]  Arrival date & time: 02/24/20     Chief Complaint   Fall   History of Present Illness   Brent Griffith is a 84 y.o. year-old male with a history of CAD presenting to the ED with chief complaint of fall.  Patient explains that his shoelace got caught on a rose bush stump and he fell forward striking the top of his head against the ground.  Denies loss of consciousness, no neck pain, no back pain, no chest pain, shortness of breath, no abdominal pain, no numbness or weakness to the arms or legs.  No nausea or vomiting since the event, which occurred 5 hours ago.  Review of Systems  A complete 10 system review of systems was obtained and all systems are negative except as noted in the HPI and PMH.   Patient's Health History    Past Medical History:  Diagnosis Date  . Acute gastric ulcer without mention of hemorrhage, perforation, or obstruction   . Allergic rhinitis, cause unspecified   . Anemia, unspecified   . Arthritis   . Asthma   . Blood transfusion without reported diagnosis   . CAD (coronary artery disease)   . Diaphragmatic hernia without mention of obstruction or gangrene   . Elevated prostate specific antigen (PSA)   . Enlarged prostate   . Esophageal reflux   . Extrinsic asthma, unspecified   . Herpes zoster without mention of complication   . Hyperlipidemia   . Hypersomnia with sleep apnea, unspecified   . Impotence of organic origin   . Kyphosis   . Lumbago   . Obstructive sleep apnea (adult) (pediatric)   . Pneumonia    hx of years ago   . Senile osteoporosis   . Trigger finger (acquired)   . Tubular adenoma of colon 05/2013  . Unspecified essential hypertension   . Unspecified sleep apnea    CPAP- settings 4-12   . Unspecified vitamin D deficiency     Past Surgical History:  Procedure Laterality Date  . CARDIAC CATHETERIZATION  03/04/2009   Dr [name]  .  COLONOSCOPY    . CORONARY ARTERY BYPASS GRAFT  2010  . CYSTOSCOPY WITH RETROGRADE PYELOGRAM, URETEROSCOPY AND STENT PLACEMENT Right 10/04/2013   Procedure: CYSTOSCOPY WITH RETROGRADE PYELOGRAM,  AND STENT PLACEMENT;  Surgeon: Festus Aloe, MD;  Location: [facility];  Service: Urology;  Laterality: Right;  . MOLE REMOVAL  1958   chin  . ROTATOR CUFF REPAIR Left 04/1998   with bone spur removed, Dr French Ana  . TONSILLECTOMY  1945  . TRANSURETHRAL RESECTION OF PROSTATE N/A 09/24/2013   Procedure: TRANSURETHRAL RESECTION OF THE PROSTATE (TURP) WITH GYRUS (STAGED RIGHT LATERAL AND MEDIAN LOBE);  Surgeon: Ailene Rud, MD;  Location: [facility];  Service: Urology;  Laterality: N/A;  . UPPER GI ENDOSCOPY      Family History  Problem Relation Age of Onset  . Stroke Mother   . Breast cancer Mother   . Cirrhosis Mother        wine  . Rheumatic fever Mother   . Kyphosis Mother   . Hypertension Father   . Stroke Father   . Heart disease Father   . Heart disease Paternal Grandfather   . Colon cancer Neg Hx     Social History   Socioeconomic History  . Marital status: Married    Spouse name: Not on file  . Number of children: 0  .  Years of education: Not on file  . Highest education level: Not on file  Occupational History  . Occupation: [employer]  Tobacco Use  . Smoking status: Never Smoker  . Smokeless tobacco: Never Used  Vaping Use  . Vaping Use: Never used  Substance and Sexual Activity  . Alcohol use: No  . Drug use: No  . Sexual activity: Not Currently  Other Topics Concern  . Not on file  Social History Narrative  . Not on file   Social Determinants of Health   Financial Resource Strain:   . Difficulty of Paying Living Expenses: Not on file  Food Insecurity:   . Worried About Charity fundraiser in the Last Year: Not on file  . Ran Out of Food in the Last Year: Not on file  Transportation Needs:   . Lack of Transportation (Medical): Not on file    . Lack of Transportation (Non-Medical): Not on file  Physical Activity:   . Days of Exercise per Week: Not on file  . Minutes of Exercise per Session: Not on file  Stress:   . Feeling of Stress : Not on file  Social Connections:   . Frequency of Communication with Friends and Family: Not on file  . Frequency of Social Gatherings with Friends and Family: Not on file  . Attends Religious Services: Not on file  . Active Member of Clubs or Organizations: Not on file  . Attends Archivist Meetings: Not on file  . Marital Status: Not on file  Intimate Partner Violence:   . Fear of Current or Ex-Partner: Not on file  . Emotionally Abused: Not on file  . Physically Abused: Not on file  . Sexually Abused: Not on file     Physical Exam   Vitals:   02/24/20 1701  BP: (!) 156/81  Pulse: 61  Resp: 18  Temp: 97.7 F (36.5 C)  SpO2: 100%    CONSTITUTIONAL: Well-appearing, NAD NEURO:  Alert and oriented x 3, no focal deficits EYES:  eyes equal and reactive ENT/NECK:  no LAD, no JVD CARDIO: Regular rate, well-perfused, normal S1 and S2 PULM:  CTAB no wheezing or rhonchi GI/GU:  normal bowel sounds, non-distended, non-tender MSK/SPINE:  No gross deformities, no edema SKIN: Hematoma and overlying abrasion to the right frontal scalp PSYCH:  Appropriate speech and behavior  *Additional and/or pertinent findings included in MDM below  Diagnostic and Interventional Summary    EKG Interpretation  Date/Time:    Ventricular Rate:    PR Interval:    QRS Duration:   QT Interval:    QTC Calculation:   R Axis:     Text Interpretation:        Labs Reviewed - No data to display  CT HEAD WO CONTRAST  Final Result      Medications - No data to display   Procedures  /  Critical Care Procedures  ED Course and Medical Decision Making  I have reviewed the triage vital signs, the nursing notes, and pertinent available records from the EMR.  Listed above are laboratory and  imaging tests that I personally ordered, reviewed, and interpreted and then considered in my medical decision making (see below for details).  Reassuring neurological exam, shared decision-making utilized with patient and family regarding CT imaging and we decided to obtain a CT to be safe and CT is reassuring, no intracranial bleeding.  Patient does take a daily aspirin.  Overall looking well normal neurological  exam, appropriate for discharge.       Barth Kirks. [place]onia Small, [place] [email or web address]  Final Clinical Impressions(s) / ED Diagnoses     ICD-10-CM   1. Fall, initial encounter  W19.XXXA   2. Injury of head, initial encounter  S09.90XA     ED Discharge Orders    None       Discharge Instructions Discussed with and Provided to Patient:     Discharge Instructions     You were evaluated in the Emergency Department and after careful evaluation, we did not find any emergent condition requiring admission or further testing in the hospital.  Your exam/testing today was overall reassuring.  CT scan did not show any bleeding on the brain.  Please return to the Emergency Department if you experience any worsening of your condition.  Thank you for allowing [place] to be a part of your care.       Maudie Flakes, MD 02/24/20 [phone]

## 2020-03-03 ENCOUNTER — Other Ambulatory Visit: Payer: Self-pay | Admitting: Nurse Practitioner

## 2020-03-11 ENCOUNTER — Other Ambulatory Visit: Payer: Self-pay | Admitting: Nurse Practitioner

## 2020-03-11 DIAGNOSIS — I10 Essential (primary) hypertension: Secondary | ICD-10-CM

## 2020-03-11 DIAGNOSIS — K219 Gastro-esophageal reflux disease without esophagitis: Secondary | ICD-10-CM

## 2020-03-17 ENCOUNTER — Other Ambulatory Visit: Payer: Self-pay | Admitting: Nurse Practitioner

## 2020-03-20 ENCOUNTER — Other Ambulatory Visit: Payer: Self-pay | Admitting: Nurse Practitioner

## 2020-03-20 DIAGNOSIS — D649 Anemia, unspecified: Secondary | ICD-10-CM

## 2020-03-20 DIAGNOSIS — D561 Beta thalassemia: Secondary | ICD-10-CM

## 2020-03-27 ENCOUNTER — Other Ambulatory Visit: Payer: Self-pay | Admitting: Nurse Practitioner

## 2020-03-27 DIAGNOSIS — I10 Essential (primary) hypertension: Secondary | ICD-10-CM

## 2020-03-27 DIAGNOSIS — K219 Gastro-esophageal reflux disease without esophagitis: Secondary | ICD-10-CM

## 2020-04-22 ENCOUNTER — Other Ambulatory Visit: Payer: Medicare Other

## 2020-04-22 ENCOUNTER — Other Ambulatory Visit: Payer: Self-pay

## 2020-04-22 DIAGNOSIS — I10 Essential (primary) hypertension: Secondary | ICD-10-CM

## 2020-04-22 DIAGNOSIS — I2510 Atherosclerotic heart disease of native coronary artery without angina pectoris: Secondary | ICD-10-CM | POA: Diagnosis not present

## 2020-04-23 LAB — COMPLETE METABOLIC PANEL WITH GFR
AG Ratio: 1.6 (calc) (ref 1.0–2.5)
ALT: 13 U/L (ref 9–46)
AST: 19 U/L (ref 10–35)
Albumin: 3.9 g/dL (ref 3.6–5.1)
Alkaline phosphatase (APISO): 42 U/L (ref 35–144)
BUN: 22 mg/dL (ref 7–25)
CO2: 26 mmol/L (ref 20–32)
Calcium: 9.6 mg/dL (ref 8.6–10.3)
Chloride: 108 mmol/L (ref 98–110)
Creat: 1.08 mg/dL (ref 0.70–1.11)
GFR, Est African American: 74 mL/min/{1.73_m2} (ref >=60)
GFR, Est Non African American: 64 mL/min/{1.73_m2} (ref >=60)
Globulin: 2.4 g/dL (calc) (ref 1.9–3.7)
Glucose, Bld: 99 mg/dL (ref 65–99)
Potassium: 4.5 mmol/L (ref 3.5–5.3)
Sodium: 139 mmol/L (ref 135–146)
Total Bilirubin: 1.2 mg/dL (ref 0.2–1.2)
Total Protein: 6.3 g/dL (ref 6.1–8.1)

## 2020-04-23 LAB — CBC WITH DIFFERENTIAL/PLATELET
Absolute Monocytes: 436 cells/uL (ref 200–950)
Basophils Absolute: 29 cells/uL (ref 0–200)
Basophils Relative: 0.6 %
Eosinophils Absolute: 176 cells/uL (ref 15–500)
Eosinophils Relative: 3.6 %
HCT: 34.2 % — ABNORMAL LOW (ref 38.5–50.0)
Hemoglobin: 11.5 g/dL — ABNORMAL LOW (ref 13.2–17.1)
Lymphs Abs: 1416 cells/uL (ref 850–3900)
MCH: 30.3 pg (ref 27.0–33.0)
MCHC: 33.6 g/dL (ref 32.0–36.0)
MCV: 90 fL (ref 80.0–100.0)
MPV: 11.5 fL (ref 7.5–12.5)
Monocytes Relative: 8.9 %
Neutro Abs: 2842 cells/uL (ref 1500–7800)
Neutrophils Relative %: 58 %
Platelets: 166 10*3/uL (ref 140–400)
RBC: 3.8 10*6/uL — ABNORMAL LOW (ref 4.20–5.80)
RDW: 11.9 % (ref 11.0–15.0)
Total Lymphocyte: 28.9 %
WBC: 4.9 10*3/uL (ref 3.8–10.8)

## 2020-04-23 LAB — LIPID PANEL
Cholesterol: 113 mg/dL (ref <200)
HDL: 58 mg/dL (ref >=40)
LDL Cholesterol (Calc): 41 mg/dL (calc)
Non-HDL Cholesterol (Calc): 55 mg/dL (calc) (ref <130)
Total CHOL/HDL Ratio: 1.9 (calc) (ref <5.0)
Triglycerides: 61 mg/dL (ref <150)

## 2020-04-27 ENCOUNTER — Ambulatory Visit: Payer: Medicare Other | Admitting: Nurse Practitioner

## 2020-05-02 ENCOUNTER — Encounter: Payer: Self-pay | Admitting: Nurse Practitioner

## 2020-05-02 ENCOUNTER — Ambulatory Visit (INDEPENDENT_AMBULATORY_CARE_PROVIDER_SITE_OTHER): Payer: Medicare Other | Admitting: Nurse Practitioner

## 2020-05-02 ENCOUNTER — Other Ambulatory Visit: Payer: Self-pay

## 2020-05-02 VITALS — BP 128/70 | HR 55 | Temp 96.9°F | Ht 64.0 in | Wt 143.0 lb

## 2020-05-02 DIAGNOSIS — I2510 Atherosclerotic heart disease of native coronary artery without angina pectoris: Secondary | ICD-10-CM | POA: Diagnosis not present

## 2020-05-02 DIAGNOSIS — N401 Enlarged prostate with lower urinary tract symptoms: Secondary | ICD-10-CM

## 2020-05-02 DIAGNOSIS — M810 Age-related osteoporosis without current pathological fracture: Secondary | ICD-10-CM

## 2020-05-02 DIAGNOSIS — D508 Other iron deficiency anemias: Secondary | ICD-10-CM

## 2020-05-02 DIAGNOSIS — K219 Gastro-esophageal reflux disease without esophagitis: Secondary | ICD-10-CM | POA: Diagnosis not present

## 2020-05-02 DIAGNOSIS — G4733 Obstructive sleep apnea (adult) (pediatric): Secondary | ICD-10-CM | POA: Diagnosis not present

## 2020-05-02 DIAGNOSIS — H6121 Impacted cerumen, right ear: Secondary | ICD-10-CM

## 2020-05-02 MED ORDER — FINASTERIDE 5 MG PO TABS: 5.0000 mg | ORAL_TABLET | Freq: Every morning | ORAL | 1 refills | Status: DC

## 2020-05-02 NOTE — Patient Instructions (Addendum)
[facility] in [place], [place] Address: [phone], [place], [place] [address] Phone: [phone]  Increase foods that are high in iron, we will check iron levels with next blood work  Follow up 6 months, labs prior to visit.

## 2020-05-02 NOTE — Progress Notes (Signed)
Careteam: Patient Care Team: [name], NP as PCP - General (Geriatric Medicine) [name], MD as Consulting Physician (Sports Medicine)  PLACE OF SERVICE:  [place] Directive information Does Patient Have a Medical Advance Directive?: No, Would patient like information on creating a medical advance directive?: Yes (MAU/Ambulatory/Procedural Areas - Information given)  Allergies  Allergen Reactions  . Compazine [Prochlorperazine Edisylate] Anxiety    Chief Complaint  Patient presents with  . Medical Management of Chronic Issues    6 month follow-up and discuss labs (copy printed). Patient is planning to get covd booster tomorrow at [facility]      HPI: Patient is a 84 y.o. male for routine follow up Pt went to the ED in October after a fall in the yard (tripped over a bush) Hit his head, there was a bump on his head for a little while but this has now resolved.   Reports when he get up to go to the bathroom and gets back to bed he has worsening shortness of breath. After a minute this resolves.   CAD- no chest pains or LE edema. Seeing cardiologist yearly. Continues on ASA and statin.   GERD- controlled. On protonix and pepcid  BPH with frequency- not on finasteride but has not had refill has not been approved from urologist. [place] with urologist every 3 months. Reports urine is a little slower off finasteride   htn- controlled on cardura and atenolol.   Hyperlipidemia- LDL at goal. Using airfryer instead of oil to cook now.   Anemia- hx of iron def. Does not eat a lot of meat Review of Systems:  Review of Systems  Constitutional: Negative for chills, fever and weight loss.  HENT: Negative for tinnitus.   Respiratory: Negative for cough, sputum production and shortness of breath.   Cardiovascular: Negative for chest pain, palpitations and leg swelling.  Gastrointestinal: Negative for abdominal pain, constipation, diarrhea and  heartburn.  Genitourinary: Negative for dysuria, frequency and urgency.  Musculoskeletal: Negative for back pain, falls, joint pain and myalgias.  Skin: Negative.   Neurological: Negative for dizziness and headaches.  Psychiatric/Behavioral: Negative for depression and memory loss. The patient does not have insomnia.     Past Medical History:  Diagnosis Date  . Acute gastric ulcer without mention of hemorrhage, perforation, or obstruction   . Allergic rhinitis, cause unspecified   . Anemia, unspecified   . Arthritis   . Asthma   . Blood transfusion without reported diagnosis   . CAD (coronary artery disease)   . Diaphragmatic hernia without mention of obstruction or gangrene   . Elevated prostate specific antigen (PSA)   . Enlarged prostate   . Esophageal reflux   . Extrinsic asthma, unspecified   . Herpes zoster without mention of complication   . Hyperlipidemia   . Hypersomnia with sleep apnea, unspecified   . Impotence of organic origin   . Kyphosis   . Lumbago   . Obstructive sleep apnea (adult) (pediatric)   . Pneumonia    hx of years ago   . Senile osteoporosis   . Trigger finger (acquired)   . Tubular adenoma of colon 05/2013  . Unspecified essential hypertension   . Unspecified sleep apnea    CPAP- settings 4-12   . Unspecified vitamin D deficiency    Past Surgical History:  Procedure Laterality Date  . CARDIAC CATHETERIZATION  03/04/2009   Dr [name]  . COLONOSCOPY    . CORONARY ARTERY BYPASS  GRAFT  2010  . CYSTOSCOPY WITH RETROGRADE PYELOGRAM, URETEROSCOPY AND STENT PLACEMENT Right 10/04/2013   Procedure: CYSTOSCOPY WITH RETROGRADE PYELOGRAM,  AND STENT PLACEMENT;  Surgeon: Festus Aloe, MD;  Location: [facility];  Service: Urology;  Laterality: Right;  . MOLE REMOVAL  1958   chin  . ROTATOR CUFF REPAIR Left 04/1998   with bone spur removed, Dr French Ana  . TONSILLECTOMY  1945  . TRANSURETHRAL RESECTION OF PROSTATE N/A 09/24/2013   Procedure:  TRANSURETHRAL RESECTION OF THE PROSTATE (TURP) WITH GYRUS (STAGED RIGHT LATERAL AND MEDIAN LOBE);  Surgeon: Ailene Rud, MD;  Location: [facility];  Service: Urology;  Laterality: N/A;  . UPPER GI ENDOSCOPY     Social History:   reports that he has never smoked. He has never used smokeless tobacco. He reports that he does not drink alcohol and does not use drugs.  Family History  Problem Relation Age of Onset  . Stroke Mother   . Breast cancer Mother   . Cirrhosis Mother        wine  . Rheumatic fever Mother   . Kyphosis Mother   . Hypertension Father   . Stroke Father   . Heart disease Father   . Heart disease Paternal Grandfather   . Colon cancer Neg Hx     Medications: Patient's Medications  New Prescriptions   No medications on file  Previous Medications   ACETAMINOPHEN (TYLENOL) 500 MG TABLET    Take 1,000 mg by mouth every 6 (six) hours as needed for mild pain or moderate pain.   ALBUTEROL (VENTOLIN HFA) 108 (90 BASE) MCG/ACT INHALER    USE 1 TO 2 INHALATIONS BY  MOUTH EVERY 6 HOURS AS  NEEDED FOR WHEEZING OR  SHORTNESS OF BREATH   ASPIRIN EC 81 MG TABLET    Take 1 tablet (81 mg total) by mouth daily.   ASPIRIN-ACETAMINOPHEN-CAFFEINE (GOODYS EXTRA STRENGTH) 500-325-65 MG PACK    Take 1 Package by mouth as needed.   ASPIRIN-SALICYLAMIDE-CAFFEINE (BC HEADACHE POWDER PO)    Take 1 Package by mouth as needed.   ATENOLOL (TENORMIN) 50 MG TABLET    TAKE 1 TABLET BY MOUTH  DAILY TO CONTROL HEART  RHYTHM AND LOWER BLOOD  PRESSURE   B COMPLEX VITAMINS TABLET    Take 1 tablet by mouth daily.   CALCIUM CITRATE-VITAMIN D (EQ CALCIUM CITRATE+D3 PO)    Take 1,200 mg by mouth daily.   CHOLECALCIFEROL (VITAMIN D-3) 5000 UNITS TABS    Take 2 tablets by mouth daily.   COENZYME Q10 (COQ10) 200 MG CAPS    Take 1 capsule by mouth daily.    DOXAZOSIN (CARDURA) 4 MG TABLET    TAKE 1 TABLET BY MOUTH  DAILY TO HELP CONTROL BLOOD PRESSURE   FAMOTIDINE (PEPCID) 20 MG TABLET    TAKE 1 TABLET BY  MOUTH  DAILY   FEXOFENADINE (ALLEGRA) 180 MG TABLET    Take 180 mg by mouth as needed.    FINASTERIDE (PROSCAR) 5 MG TABLET    Take 5 mg by mouth every morning.   FOLIC ACID (FOLVITE) 1 MG TABLET    TAKE 1 TABLET BY MOUTH  DAILY   MULTIPLE VITAMIN (MULTIVITAMIN) TABLET    Take 1 tablet by mouth daily.   PANTOPRAZOLE (PROTONIX) 40 MG TABLET    TAKE 1 TABLET BY MOUTH  DAILY   PROBIOTIC PRODUCT (PROBIOTIC DAILY PO)    Take 1 capsule by mouth daily.    ROSUVASTATIN (CRESTOR) 20 MG  TABLET    TAKE 1 TABLET BY MOUTH ONCE DAILY FOR CHOLESTEROL  Modified Medications   No medications on file  Discontinued Medications   No medications on file    Physical Exam:  Vitals:   05/02/20 1440  BP: 128/70  Pulse: (!) 55  Temp: (!) 96.9 F (36.1 C)  TempSrc: Temporal  SpO2: 98%  Weight: 143 lb (64.9 kg)  Height: _0  (1.626 m)   Body mass index is 24.55 kg/m. Wt Readings from Last 3 Encounters:  05/02/20 143 lb (64.9 kg)  11/11/19 144 lb (65.3 kg)  11/02/19 138 lb 8 oz (62.8 kg)    Physical Exam Constitutional:      General: He is not in acute distress.    Appearance: He is well-developed and well-nourished. He is not diaphoretic.  HENT:     Head: Normocephalic and atraumatic.     Right Ear: There is impacted cerumen.     Mouth/Throat:     Mouth: Oropharynx is clear and moist.     Pharynx: No oropharyngeal exudate.  Eyes:     Extraocular Movements: EOM normal.     Conjunctiva/sclera: Conjunctivae normal.     Pupils: Pupils are equal, round, and reactive to light.  Cardiovascular:     Rate and Rhythm: Normal rate and regular rhythm.     Heart sounds: Normal heart sounds.  Pulmonary:     Effort: Pulmonary effort is normal.     Breath sounds: Normal breath sounds.  Abdominal:     General: Bowel sounds are normal.     Palpations: Abdomen is soft.  Musculoskeletal:        General: No tenderness or edema.     Cervical back: Normal range of motion and neck supple.  Skin:     General: Skin is warm and dry.  Neurological:     Mental Status: He is alert and oriented to person, place, and time.  Psychiatric:        Mood and Affect: Mood and affect normal.     Labs reviewed: Basic Metabolic Panel: Recent Labs    10/19/19 1206 04/22/20 1026  NA 140 139  K 4.3 4.5  CL 107 108  CO2 24 26  GLUCOSE 93 99  BUN 14 22  CREATININE 1.04 1.08  CALCIUM 8.9 9.6   Liver Function Tests: Recent Labs    10/19/19 1206 04/22/20 1026  AST 17 19  ALT 15 13  BILITOT 1.1 1.2  PROT 6.4 6.3   No results for input(s): LIPASE, AMYLASE in the last 8760 hours. No results for input(s): AMMONIA in the last 8760 hours. CBC: Recent Labs    10/19/19 1206 04/22/20 1026  WBC 5.1 4.9  NEUTROABS 3,147 2,842  HGB 12.1* 11.5*  HCT 35.6* 34.2*  MCV 91.8 90.0  PLT 163 166   Lipid Panel: Recent Labs    04/22/20 1026  CHOL 113  HDL 58  LDLCALC 41  TRIG 61  CHOLHDL 1.9   TSH: No results for input(s): TSH in the last 8760 hours. A1C: Lab Results  Component Value Date   HGBA1C 5.6 07/28/2012     Assessment/Plan 1. Benign prostatic hyperplasia with lower urinary tract symptoms, symptom details unspecified -stable at this time. - finasteride (PROSCAR) 5 MG tablet; Take 1 tablet (5 mg total) by mouth every morning.  Dispense: 90 tablet; Refill: 1  2. Coronary artery disease involving native coronary artery of native heart without angina pectoris Stable, without chest pains at this time.  Continues on ASA, statin and atenolol.  - Iron, TIBC and Ferritin Panel; Future - CBC with Differential/Platelet; Future - CMP with eGFR([facility]); Future  3. Gastroesophageal reflux disease, unspecified whether esophagitis present Stable on protonix 40 mg by mouth daily   4. Obstructive sleep apnea Continues on CPAP.   5. Senile osteoporosis -followed by orthopedic, off fosamax and monitoring at this time  - CMP with eGFR([facility]); Future  6. Iron deficiency anemia secondary  to inadequate dietary iron intake -will increase iron in diet at this time. Will follow up labs prior to next visit.  - Iron, TIBC and Ferritin Panel; Future - CBC with Differential/Platelet; Future  7. Right ear impacted cerumen Removed via flush and grabber, pt tolerated well.  Next appt: 6 months. Labs prior [employer]. [place], [facility] [phone]

## 2020-05-19 DIAGNOSIS — H04123 Dry eye syndrome of bilateral lacrimal glands: Secondary | ICD-10-CM | POA: Diagnosis not present

## 2020-05-19 DIAGNOSIS — D3132 Benign neoplasm of left choroid: Secondary | ICD-10-CM | POA: Diagnosis not present

## 2020-05-19 DIAGNOSIS — H40013 Open angle with borderline findings, low risk, bilateral: Secondary | ICD-10-CM | POA: Diagnosis not present

## 2020-05-19 DIAGNOSIS — H2513 Age-related nuclear cataract, bilateral: Secondary | ICD-10-CM | POA: Diagnosis not present

## 2020-05-19 DIAGNOSIS — H10413 Chronic giant papillary conjunctivitis, bilateral: Secondary | ICD-10-CM | POA: Diagnosis not present

## 2020-08-15 ENCOUNTER — Other Ambulatory Visit: Payer: Self-pay | Admitting: Nurse Practitioner

## 2020-08-15 DIAGNOSIS — I10 Essential (primary) hypertension: Secondary | ICD-10-CM

## 2020-08-31 NOTE — Progress Notes (Signed)
Subjective:    Patient ID: Brent Griffith, male    DOB: [birth year], 85 y.o.   MRN: [id number]  HPI  male never smoker followed for OSA, periodic limb movement, complicated by CAD/CABG,   HBP, GERD,                 NPSG 12/15/04- AHI  9.2/ hr, desaturation to 76%, PLMA 13.6/ hr, body weight 170 lbs Heart surgery Nov 2010 CABG 4v PFT: 05/04/2014: Normal spirometry flows, insignificant response to bronchodilator, moderate diffusion defect 59% of predicted, possible air trapping ---------------------------------------------------------------------------------------------------------   08/31/19- 85 yo male never smoker followed for OSA, periodic limb movement, complicated by CAD/CABG,  HBP, GERD,    CPAP auto 6-12/Adapt Download compliance 97%, AHI 4.1/ hr -----f/u OSA. Breathing is at baseline. ED visit 10/26/2018- Epigastric pain, ventricular bigeminy Proventil hfa,  Had 2 Moderna Covax. Feels table with no concerns to report. Comfortable with CPAP. No recent use of rescue inhaler- discussed expiration dates. Accidentally inhaled undisolved Goody Powder dust, triggering laryngospasm that was self limited- discussed.  09/01/20- 85 yo male never smoker followed for OSA, periodic limb movement, complicated by CAD/CABG,  HTN, GERD,  Allergic Rhinitis,  CPAP auto 6-12/Adapt Download-compliance 80%, AHI 3.4 Body Weight today-138 lbs Covid vax-3 Moderna Flu vax-had -----No complaints. Occ snore. Sleeps ok.  Has So-Clean machine Rarely needs rescue inhaler.   Review of Systems- see HPI   + = positive Constitutional:   No-   weight loss, night sweats, fevers, chills, fatigue, lassitude. HEENT:   No-   headaches, difficulty swallowing, tooth/dental problems, sore throat,                  No-   sneezing, itching, ear ache, nasal congestion, +post nasal drip,  CV:  No-   chest pain, orthopnea, PND, swelling in lower extremities, anasarca,dizziness, palpitations GI:  No-   heartburn, indigestion,  abdominal pain, nausea, vomiting,  Resp: +  shortness of breath with exertion or at rest.  No-  excess mucus,             No-   productive cough,   non-productive cough,  No-  coughing up of blood.              No-   change in color of mucus.  No- wheezing.   Skin: No-   rash or lesions. GU: . MS:  No-   joint pain or swelling. [place] Psych:  No- change in mood or affect. No depression or anxiety.  No memory loss.    Objective:   General- Alert, Oriented, Affect-appropriate, Distress- none acute.  Skin- rash-none, lesions- none, excoriation- none Lymphadenopathy- none Head- atraumatic            Eyes- Gross vision intact, PERRLA, conjunctivae clear secretions            Ears- Hearing, canals normal            Nose- Clear, +Septal dev/ external nasal deviation,  No-mucus, polyps, erosion, perforation             Throat- Mallampati II-III , mucosa clear , drainage- none, tonsils- atrophic Neck- +cervical kyphosis Chest - symmetrical excursion , unlabored           Heart/CV- RRR , no murmur , no gallop  , no rub, nl s1 s2                           -  JVD- none , edema- none, stasis changes- none, varices- none           Lung- clear to P&A, wheeze- none, cough-none, dullness-none, rub- none           Chest wall-  Abd-  Br/ Gen/ Rectal- Not done, not indicated Extrem- cyanosis- none, clubbing, none, atrophy- none, strength- nl Neuro- grossly intact to observation     Assessment & Plan:

## 2020-09-01 ENCOUNTER — Encounter: Payer: Self-pay | Admitting: Internal Medicine

## 2020-09-01 ENCOUNTER — Other Ambulatory Visit: Payer: Self-pay

## 2020-09-01 ENCOUNTER — Ambulatory Visit (INDEPENDENT_AMBULATORY_CARE_PROVIDER_SITE_OTHER): Payer: Medicare Other | Admitting: Internal Medicine

## 2020-09-01 VITALS — BP 118/56 | HR 69 | Temp 98.2°F | Ht 64.0 in | Wt 138.0 lb

## 2020-09-01 DIAGNOSIS — G4733 Obstructive sleep apnea (adult) (pediatric): Secondary | ICD-10-CM

## 2020-09-01 DIAGNOSIS — J302 Other seasonal allergic rhinitis: Secondary | ICD-10-CM

## 2020-09-01 DIAGNOSIS — J3089 Other allergic rhinitis: Secondary | ICD-10-CM

## 2020-09-01 NOTE — Patient Instructions (Signed)
Order- DME [employer]- please change autopap range to 6-15, continue mask of choice, humidifier, supplies, airView/ card  Please call if we can help

## 2020-10-06 ENCOUNTER — Other Ambulatory Visit: Payer: Self-pay | Admitting: Nurse Practitioner

## 2020-10-06 DIAGNOSIS — N401 Enlarged prostate with lower urinary tract symptoms: Secondary | ICD-10-CM

## 2020-10-13 ENCOUNTER — Other Ambulatory Visit: Payer: Self-pay | Admitting: Nurse Practitioner

## 2020-10-13 DIAGNOSIS — K219 Gastro-esophageal reflux disease without esophagitis: Secondary | ICD-10-CM

## 2020-10-31 ENCOUNTER — Ambulatory Visit (INDEPENDENT_AMBULATORY_CARE_PROVIDER_SITE_OTHER): Payer: Medicare Other | Admitting: Nurse Practitioner

## 2020-10-31 ENCOUNTER — Encounter: Payer: Self-pay | Admitting: Nurse Practitioner

## 2020-10-31 ENCOUNTER — Other Ambulatory Visit: Payer: Self-pay

## 2020-10-31 VITALS — BP 122/70 | HR 50 | Temp 97.1°F | Ht 64.0 in | Wt 132.8 lb

## 2020-10-31 DIAGNOSIS — D508 Other iron deficiency anemias: Secondary | ICD-10-CM

## 2020-10-31 DIAGNOSIS — K219 Gastro-esophageal reflux disease without esophagitis: Secondary | ICD-10-CM

## 2020-10-31 DIAGNOSIS — I10 Essential (primary) hypertension: Secondary | ICD-10-CM | POA: Diagnosis not present

## 2020-10-31 DIAGNOSIS — I2510 Atherosclerotic heart disease of native coronary artery without angina pectoris: Secondary | ICD-10-CM

## 2020-10-31 DIAGNOSIS — R634 Abnormal weight loss: Secondary | ICD-10-CM | POA: Diagnosis not present

## 2020-10-31 DIAGNOSIS — K089 Disorder of teeth and supporting structures, unspecified: Secondary | ICD-10-CM | POA: Diagnosis not present

## 2020-10-31 DIAGNOSIS — K5901 Slow transit constipation: Secondary | ICD-10-CM

## 2020-10-31 LAB — CBC WITH DIFFERENTIAL/PLATELET
Absolute Monocytes: 451 cells/uL (ref 200–950)
Basophils Absolute: 21 cells/uL (ref 0–200)
Basophils Relative: 0.4 %
Eosinophils Absolute: 101 cells/uL (ref 15–500)
Eosinophils Relative: 1.9 %
HCT: 34.7 % — ABNORMAL LOW (ref 38.5–50.0)
Hemoglobin: 11.7 g/dL — ABNORMAL LOW (ref 13.2–17.1)
Lymphs Abs: 1410 cells/uL (ref 850–3900)
MCH: 31 pg (ref 27.0–33.0)
MCHC: 33.7 g/dL (ref 32.0–36.0)
MCV: 92 fL (ref 80.0–100.0)
MPV: 11.6 fL (ref 7.5–12.5)
Monocytes Relative: 8.5 %
Neutro Abs: 3318 cells/uL (ref 1500–7800)
Neutrophils Relative %: 62.6 %
Platelets: 164 10*3/uL (ref 140–400)
RBC: 3.77 10*6/uL — ABNORMAL LOW (ref 4.20–5.80)
RDW: 12.6 % (ref 11.0–15.0)
Total Lymphocyte: 26.6 %
WBC: 5.3 10*3/uL (ref 3.8–10.8)

## 2020-10-31 LAB — COMPLETE METABOLIC PANEL WITH GFR
AG Ratio: 1.8 (calc) (ref 1.0–2.5)
ALT: 13 U/L (ref 9–46)
AST: 19 U/L (ref 10–35)
Albumin: 4.1 g/dL (ref 3.6–5.1)
Alkaline phosphatase (APISO): 34 U/L — ABNORMAL LOW (ref 35–144)
BUN: 21 mg/dL (ref 7–25)
CO2: 26 mmol/L (ref 20–32)
Calcium: 8.9 mg/dL (ref 8.6–10.3)
Chloride: 107 mmol/L (ref 98–110)
Creat: 1.08 mg/dL (ref 0.70–1.11)
GFR, Est African American: 73 mL/min/{1.73_m2} (ref >=60)
GFR, Est Non African American: 63 mL/min/{1.73_m2} (ref >=60)
Globulin: 2.3 g/dL (calc) (ref 1.9–3.7)
Glucose, Bld: 84 mg/dL (ref 65–139)
Potassium: 4.1 mmol/L (ref 3.5–5.3)
Sodium: 140 mmol/L (ref 135–146)
Total Bilirubin: 1.6 mg/dL — ABNORMAL HIGH (ref 0.2–1.2)
Total Protein: 6.4 g/dL (ref 6.1–8.1)

## 2020-10-31 MED ORDER — ATENOLOL 50 MG PO TABS: 50.0000 mg | ORAL_TABLET | Freq: Every day | ORAL | 3 refills | Status: DC

## 2020-10-31 MED ORDER — FAMOTIDINE 20 MG PO TABS: 20.0000 mg | ORAL_TABLET | Freq: Every day | ORAL | 3 refills | Status: DC

## 2020-10-31 MED ORDER — DOXAZOSIN MESYLATE 4 MG PO TABS: ORAL_TABLET | ORAL | 1 refills | Status: DC

## 2020-10-31 MED ORDER — PANTOPRAZOLE SODIUM 40 MG PO TBEC: 40.0000 mg | DELAYED_RELEASE_TABLET | Freq: Every day | ORAL | 3 refills | Status: DC

## 2020-10-31 NOTE — Progress Notes (Signed)
Careteam: Patient Care Team: [name], NP as PCP - General (Geriatric Medicine) [name], MD as Consulting Physician (Sports Medicine)  PLACE OF SERVICE:  [place] Directive information Does Patient Have a Medical Advance Directive?: No, Would patient like information on creating a medical advance directive?: No - Patient declined  Allergies  Allergen Reactions   Compazine [Prochlorperazine Edisylate] Anxiety    Chief Complaint  Patient presents with   Medical Management of Chronic Issues    6 month follow up. Patient has not been eating well. Patient would like to discuss medications.   Health Maintenance    Zoster vaccine, colonoscopy     HPI: Patient is a 85 y.o. male for routine follow up.   Has lost weight due to financial issues does not have a lot of food in the house. And had 6 teeth removed making it harder for him to chew. Has dental care in [place]. Having a hard time affording the things he needs for his mouth care.  Denies anxiety or depression.   Htn- controlled on doxazosin, and atenolol    Bph- followed by urologist in the past, flow and frequency has been stable on finasteride and doxazosin   Reports he had a flashing vision episode- happens occasionally. Then vision is blurry where the flashing episode was. Has been going on  few years. Keeps up with this ophthalmologist appt yearly. Thought to possibly be migraine related but he does not have headache related to this.    Review of Systems:  Review of Systems  Constitutional:  Positive for weight loss. Negative for chills and fever.  HENT:  Negative for tinnitus.   Respiratory:  Negative for cough, sputum production and shortness of breath.   Cardiovascular:  Negative for chest pain, palpitations and leg swelling.  Gastrointestinal:  Negative for abdominal pain, constipation, diarrhea and heartburn.  Genitourinary:  Negative for dysuria, frequency and urgency.   Musculoskeletal:  Negative for back pain, falls, joint pain and myalgias.  Skin: Negative.   Neurological:  Negative for dizziness and headaches.  Psychiatric/Behavioral:  Negative for depression and memory loss. The patient does not have insomnia.    Past Medical History:  Diagnosis Date   Acute gastric ulcer without mention of hemorrhage, perforation, or obstruction    Allergic rhinitis, cause unspecified    Anemia, unspecified    Arthritis    Asthma    Blood transfusion without reported diagnosis    CAD (coronary artery disease)    Diaphragmatic hernia without mention of obstruction or gangrene    Elevated prostate specific antigen (PSA)    Enlarged prostate    Esophageal reflux    Extrinsic asthma, unspecified    Herpes zoster without mention of complication    Hyperlipidemia    Hypersomnia with sleep apnea, unspecified    Impotence of organic origin    Kyphosis    Lumbago    Obstructive sleep apnea (adult) (pediatric)    Pneumonia    hx of years ago    Senile osteoporosis    Trigger finger (acquired)    Tubular adenoma of colon 05/2013   Unspecified essential hypertension    Unspecified sleep apnea    CPAP- settings 4-12    Unspecified vitamin D deficiency    Past Surgical History:  Procedure Laterality Date   CARDIAC CATHETERIZATION  03/04/2009   Dr [name]   COLONOSCOPY     CORONARY ARTERY BYPASS GRAFT  2010   CYSTOSCOPY WITH  RETROGRADE PYELOGRAM, URETEROSCOPY AND STENT PLACEMENT Right 10/04/2013   Procedure: CYSTOSCOPY WITH RETROGRADE PYELOGRAM,  AND STENT PLACEMENT;  Surgeon: Festus Aloe, MD;  Location: [facility];  Service: Urology;  Laterality: Right;   DENTAL SURGERY N/A    MOLE REMOVAL  1958   chin   ROTATOR CUFF REPAIR Left 04/1998   with bone spur removed, Dr French Ana   TONSILLECTOMY  1945   TRANSURETHRAL RESECTION OF PROSTATE N/A 09/24/2013   Procedure: TRANSURETHRAL RESECTION OF THE PROSTATE (TURP) WITH GYRUS (STAGED RIGHT LATERAL AND MEDIAN  LOBE);  Surgeon: Ailene Rud, MD;  Location: [facility];  Service: Urology;  Laterality: N/A;   UPPER GI ENDOSCOPY     Social History:   reports that he has never smoked. He has never used smokeless tobacco. He reports that he does not drink alcohol and does not use drugs.  Family History  Problem Relation Age of Onset   Stroke Mother    Breast cancer Mother    Cirrhosis Mother        wine   Rheumatic fever Mother    Kyphosis Mother    Hypertension Father    Stroke Father    Heart disease Father    Heart disease Paternal Grandfather    Colon cancer Neg Hx     Medications: Patient's Medications  New Prescriptions   No medications on file  Previous Medications   ACETAMINOPHEN (TYLENOL) 500 MG TABLET    Take 1,000 mg by mouth every 6 (six) hours as needed for mild pain or moderate pain.   ALBUTEROL (VENTOLIN HFA) 108 (90 BASE) MCG/ACT INHALER    USE 1 TO 2 INHALATIONS BY  MOUTH EVERY 6 HOURS AS  NEEDED FOR WHEEZING OR  SHORTNESS OF BREATH   ASPIRIN EC 81 MG TABLET    Take 1 tablet (81 mg total) by mouth daily.   ASPIRIN-ACETAMINOPHEN-CAFFEINE (GOODYS EXTRA STRENGTH) 500-325-65 MG PACK    Take 1 Package by mouth as needed.   ASPIRIN-SALICYLAMIDE-CAFFEINE (BC HEADACHE POWDER PO)    Take 1 Package by mouth as needed.   ATENOLOL (TENORMIN) 50 MG TABLET    TAKE 1 TABLET BY MOUTH  DAILY TO CONTROL HEART  RHYTHM AND LOWER BLOOD  PRESSURE   B COMPLEX VITAMINS TABLET    Take 1 tablet by mouth daily.   CALCIUM CITRATE-VITAMIN D (EQ CALCIUM CITRATE+D3 PO)    Take 1,200 mg by mouth daily.   CHOLECALCIFEROL (VITAMIN D-3) 5000 UNITS TABS    Take 2 tablets by mouth daily.   COENZYME Q10 (COQ10) 200 MG CAPS    Take 1 capsule by mouth daily.    DOXAZOSIN (CARDURA) 4 MG TABLET    TAKE 1 TABLET BY MOUTH  DAILY TO HELP CONTROL BLOOD PRESSURE   FAMOTIDINE (PEPCID) 20 MG TABLET    TAKE 1 TABLET BY MOUTH  DAILY   FEXOFENADINE (ALLEGRA) 180 MG TABLET    Take 180 mg by mouth as needed.    FINASTERIDE  (PROSCAR) 5 MG TABLET    Take 1 tablet (5 mg total) by mouth every morning.   FOLIC ACID (FOLVITE) 1 MG TABLET    TAKE 1 TABLET BY MOUTH  DAILY   MULTIPLE VITAMIN (MULTIVITAMIN) TABLET    Take 1 tablet by mouth daily.   PANTOPRAZOLE (PROTONIX) 40 MG TABLET    TAKE 1 TABLET BY MOUTH  DAILY   PROBIOTIC PRODUCT (PROBIOTIC DAILY PO)    Take 1 capsule by mouth daily.    ROSUVASTATIN (CRESTOR)  20 MG TABLET    TAKE 1 TABLET BY MOUTH ONCE DAILY FOR CHOLESTEROL   VITAMINS-LIPOTROPICS (LIPO-FLAVONOID PLUS PO)    Take 1 tablet by mouth as needed (Tinnitus).  Modified Medications   No medications on file  Discontinued Medications   AMOXICILLIN (AMOXIL) 500 MG CAPSULE    Take by mouth.    Physical Exam:  Vitals:   10/31/20 1446  BP: 122/70  Pulse: (!) 50  Temp: (!) 97.1 F (36.2 C)  TempSrc: Temporal  SpO2: 98%  Weight: 132 lb 12.8 oz (60.2 kg)  Height: $Remove'5\' 4"'TjiinFP$  (1.626 m)   Body mass index is 22.8 kg/m. Wt Readings from Last 3 Encounters:  10/31/20 132 lb 12.8 oz (60.2 kg)  09/01/20 138 lb (62.6 kg)  05/02/20 143 lb (64.9 kg)    Physical Exam Constitutional:      General: He is not in acute distress.    Appearance: He is well-developed. He is not diaphoretic.     Comments: Thin male  HENT:     Head: Normocephalic and atraumatic.     Right Ear: External ear normal.     Left Ear: External ear normal.     Mouth/Throat:     Pharynx: No oropharyngeal exudate.  Eyes:     Conjunctiva/sclera: Conjunctivae normal.     Pupils: Pupils are equal, round, and reactive to light.  Cardiovascular:     Rate and Rhythm: Normal rate and regular rhythm.     Heart sounds: Normal heart sounds.  Pulmonary:     Effort: Pulmonary effort is normal.     Breath sounds: Normal breath sounds.  Abdominal:     General: Bowel sounds are normal.     Palpations: Abdomen is soft.  Musculoskeletal:        General: No tenderness.     Cervical back: Normal range of motion and neck supple.     Right lower leg: No  edema.     Left lower leg: No edema.  Skin:    General: Skin is warm and dry.  Neurological:     Mental Status: He is alert and oriented to person, place, and time.    Labs reviewed: Basic Metabolic Panel: Recent Labs    04/22/20 1026  NA 139  K 4.5  CL 108  CO2 26  GLUCOSE 99  BUN 22  CREATININE 1.08  CALCIUM 9.6   Liver Function Tests: Recent Labs    04/22/20 1026  AST 19  ALT 13  BILITOT 1.2  PROT 6.3   No results for input(s): LIPASE, AMYLASE in the last 8760 hours. No results for input(s): AMMONIA in the last 8760 hours. CBC: Recent Labs    04/22/20 1026  WBC 4.9  NEUTROABS 2,842  HGB 11.5*  HCT 34.2*  MCV 90.0  PLT 166   Lipid Panel: Recent Labs    04/22/20 1026  CHOL 113  HDL 58  LDLCALC 41  TRIG 61  CHOLHDL 1.9   TSH: No results for input(s): TSH in the last 8760 hours. A1C: Lab Results  Component Value Date   HGBA1C 5.6 07/28/2012     Assessment/Plan 1. Weight loss -multi factorial between lack of access to food he prefers, poor dentition, stress which is decreasing appetite. He does not feel like he needs medication to help with anxiety/depression. Will continue to monitor  - AMB Referral to [facility] place to help with resources.   2. Poor dentition -needing more dental work done but finances are  an issue - AMB Referral to [facility]  3. Coronary artery disease involving native coronary artery of native heart without angina pectoris -stable, without chest pains, continues on ASA with betablocker - CMP with eGFR([facility]) - atenolol (TENORMIN) 50 MG tablet; Take 1 tablet (50 mg total) by mouth daily.  Dispense: 90 tablet; Refill: 3  4. Iron deficiency anemia secondary to inadequate dietary iron intake -without signs of blood loss. c - CBC with Differential/Platelet  5. Gastroesophageal reflux disease without esophagitis -stable on current regimen.  - pantoprazole (PROTONIX) 40 MG tablet; Take 1 tablet (40 mg  total) by mouth daily.  Dispense: 90 tablet; Refill: 3 - famotidine (PEPCID) 20 MG tablet; Take 1 tablet (20 mg total) by mouth daily.  Dispense: 90 tablet; Refill: 3  6. Essential hypertension -controlled on current regimen.  - doxazosin (CARDURA) 4 MG tablet; TAKE 1 TABLET BY MOUTH  DAILY TO HELP CONTROL BLOOD PRESSURE  Dispense: 90 tablet; Refill: 1 - atenolol (TENORMIN) 50 MG tablet; Take 1 tablet (50 mg total) by mouth daily.  Dispense: 90 tablet; Refill: 3  7. Slow transit constipation -due to diet. Encouraged to increase fiber and water. Can use stool softener as needed.   Next appt: 6 months for routine follow up [place]. [place], [facility] [phone]

## 2020-10-31 NOTE — Patient Instructions (Signed)

## 2020-11-01 ENCOUNTER — Other Ambulatory Visit: Payer: Self-pay

## 2020-11-01 DIAGNOSIS — K089 Disorder of teeth and supporting structures, unspecified: Secondary | ICD-10-CM

## 2020-11-03 ENCOUNTER — Other Ambulatory Visit: Payer: Self-pay

## 2020-11-03 ENCOUNTER — Ambulatory Visit (INDEPENDENT_AMBULATORY_CARE_PROVIDER_SITE_OTHER): Payer: Medicare Other | Admitting: Nurse Practitioner

## 2020-11-03 ENCOUNTER — Encounter: Payer: Self-pay | Admitting: Nurse Practitioner

## 2020-11-03 DIAGNOSIS — Z0000 Encounter for general adult medical examination without abnormal findings: Secondary | ICD-10-CM

## 2020-11-03 NOTE — Patient Instructions (Signed)
Brent Griffith , Thank you for taking time to come for your Medicare Wellness Visit. I appreciate your ongoing commitment to your health goals. Please review the following plan we discussed and let me know if I can assist you in the future.   Screening recommendations/referrals: Colonoscopy aged out Recommended yearly ophthalmology/optometry visit for glaucoma screening and checkup Recommended yearly dental visit for hygiene and checkup  Vaccinations: Influenza vaccine yearly  Pneumococcal vaccine up to date Tdap vaccine up to date Shingles vaccine RECOMMENDED to get 2nd Vaccine    Advanced directives: recommended to look over MOST form- we can complete in office  Conditions/risks identified: risk for malnutrition due to weight loss.   Next appointment: 1 year for awv  Preventive Care 71 Years and Older, Male Preventive care refers to lifestyle choices and visits with your health care provider that can promote health and wellness. What does preventive care include? A yearly physical exam. This is also called an annual well check. Dental exams once or twice a year. Routine eye exams. Ask your health care provider how often you should have your eyes checked. Personal lifestyle choices, including: Daily care of your teeth and gums. Regular physical activity. Eating a healthy diet. Avoiding tobacco and drug use. Limiting alcohol use. Practicing safe sex. Taking low doses of aspirin every day. Taking vitamin and mineral supplements as recommended by your health care provider. What happens during an annual well check? The services and screenings done by your health care provider during your annual well check will depend on your age, overall health, lifestyle risk factors, and family history of disease. Counseling  Your health care provider may ask you questions about your: Alcohol use. Tobacco use. Drug use. Emotional well-being. Home and relationship well-being. Sexual  activity. Eating habits. History of falls. Memory and ability to understand (cognition). Work and work Statistician. Screening  You may have the following tests or measurements: Height, weight, and BMI. Blood pressure. Lipid and cholesterol levels. These may be checked every 5 years, or more frequently if you are over 58 years old. Skin check. Lung cancer screening. You may have this screening every year starting at age 44 if you have a 30-pack-year history of smoking and currently smoke or have quit within the past 15 years. Fecal occult blood test (FOBT) of the stool. You may have this test every year starting at age 20. Flexible sigmoidoscopy or colonoscopy. You may have a sigmoidoscopy every 5 years or a colonoscopy every 10 years starting at age 53. Prostate cancer screening. Recommendations will vary depending on your family history and other risks. Hepatitis C blood test. Hepatitis B blood test. Sexually transmitted disease (STD) testing. Diabetes screening. This is done by checking your blood sugar (glucose) after you have not eaten for a while (fasting). You may have this done every 1-3 years. Abdominal aortic aneurysm (AAA) screening. You may need this if you are a current or former smoker. Osteoporosis. You may be screened starting at age 60 if you are at high risk. Talk with your health care provider about your test results, treatment options, and if necessary, the need for more tests. Vaccines  Your health care provider may recommend certain vaccines, such as: Influenza vaccine. This is recommended every year. Tetanus, diphtheria, and acellular pertussis (Tdap, Td) vaccine. You may need a Td booster every 10 years. Zoster vaccine. You may need this after age 80. Pneumococcal 13-valent conjugate (PCV13) vaccine. One dose is recommended after age 25. Pneumococcal polysaccharide (PPSV23) vaccine. One  dose is recommended after age 15. Talk to your health care provider about which  screenings and vaccines you need and how often you need them. This information is not intended to replace advice given to you by your health care provider. Make sure you discuss any questions you have with your health care provider. Document Released: 05/20/2015 Document Revised: 01/11/2016 Document Reviewed: 02/22/2015 Elsevier Interactive Patient Education  2017 [place] Prevention in the Home Falls can cause injuries. They can happen to people of all ages. There are many things you can do to make your home safe and to help prevent falls. What can I do on the outside of my home? Regularly fix the edges of walkways and driveways and fix any cracks. Remove anything that might make you trip as you walk through a door, such as a raised step or threshold. Trim any bushes or trees on the path to your home. Use bright outdoor lighting. Clear any walking paths of anything that might make someone trip, such as rocks or tools. Regularly check to see if handrails are loose or broken. Make sure that both sides of any steps have handrails. Any raised decks and porches should have guardrails on the edges. Have any leaves, snow, or ice cleared regularly. Use sand or salt on walking paths during winter. Clean up any spills in your garage right away. This includes oil or grease spills. What can I do in the bathroom? Use night lights. Install grab bars by the toilet and in the tub and shower. Do not use towel bars as grab bars. Use non-skid mats or decals in the tub or shower. If you need to sit down in the shower, use a plastic, non-slip stool. Keep the floor dry. Clean up any water that spills on the floor as soon as it happens. Remove soap buildup in the tub or shower regularly. Attach bath mats securely with double-sided non-slip rug tape. Do not have throw rugs and other things on the floor that can make you trip. What can I do in the bedroom? Use night lights. Make sure that you have a  light by your bed that is easy to reach. Do not use any sheets or blankets that are too big for your bed. They should not hang down onto the floor. Have a firm chair that has side arms. You can use this for support while you get dressed. Do not have throw rugs and other things on the floor that can make you trip. What can I do in the kitchen? Clean up any spills right away. Avoid walking on wet floors. Keep items that you use a lot in easy-to-reach places. If you need to reach something above you, use a strong step stool that has a grab bar. Keep electrical cords out of the way. Do not use floor polish or wax that makes floors slippery. If you must use wax, use non-skid floor wax. Do not have throw rugs and other things on the floor that can make you trip. What can I do with my stairs? Do not leave any items on the stairs. Make sure that there are handrails on both sides of the stairs and use them. Fix handrails that are broken or loose. Make sure that handrails are as long as the stairways. Check any carpeting to make sure that it is firmly attached to the stairs. Fix any carpet that is loose or worn. Avoid having throw rugs at the top or bottom of the  stairs. If you do have throw rugs, attach them to the floor with carpet tape. Make sure that you have a light switch at the top of the stairs and the bottom of the stairs. If you do not have them, ask someone to add them for you. What else can I do to help prevent falls? Wear shoes that: Do not have high heels. Have rubber bottoms. Are comfortable and fit you well. Are closed at the toe. Do not wear sandals. If you use a stepladder: Make sure that it is fully opened. Do not climb a closed stepladder. Make sure that both sides of the stepladder are locked into place. Ask someone to hold it for you, if possible. Clearly mark and make sure that you can see: Any grab bars or handrails. First and last steps. Where the edge of each step  is. Use tools that help you move around (mobility aids) if they are needed. These include: Canes. Walkers. Scooters. Crutches. Turn on the lights when you go into a dark area. Replace any light bulbs as soon as they burn out. Set up your furniture so you have a clear path. Avoid moving your furniture around. If any of your floors are uneven, fix them. If there are any pets around you, be aware of where they are. Review your medicines with your doctor. Some medicines can make you feel dizzy. This can increase your chance of falling. Ask your doctor what other things that you can do to help prevent falls. This information is not intended to replace advice given to you by your health care provider. Make sure you discuss any questions you have with your health care provider. Document Released: 02/17/2009 Document Revised: 09/29/2015 Document Reviewed: 05/28/2014 Elsevier Interactive Patient Education  2017 [employer].

## 2020-11-03 NOTE — Progress Notes (Signed)
Subjective:   Brent Griffith is a 85 y.o. male who presents for Medicare Annual/Subsequent preventive examination.  Review of Systems           Objective:    There were no vitals filed for this visit. There is no height or weight on file to calculate BMI.  Advanced Directives 11/03/2020 10/31/2020 05/02/2020 02/24/2020 10/23/2019 10/30/2018 10/26/2018  Does Patient Have a Medical Advance Directive? No No No No No No No  Would patient like information on creating a medical advance directive? No - Patient declined No - Patient declined Yes (MAU/Ambulatory/Procedural Areas - Information given) - No - Patient declined No - Patient declined No - Patient declined  Pre-existing out of facility DNR order (yellow form or pink MOST form) - - - - - - -    Current Medications (verified) Outpatient Encounter Medications as of 11/03/2020  Medication Sig   acetaminophen (TYLENOL) 500 MG tablet Take 1,000 mg by mouth every 6 (six) hours as needed for mild pain or moderate pain.   albuterol (VENTOLIN HFA) 108 (90 Base) MCG/ACT inhaler USE 1 TO 2 INHALATIONS BY  MOUTH EVERY 6 HOURS AS  NEEDED FOR WHEEZING OR  SHORTNESS OF BREATH   aspirin EC 81 MG tablet Take 1 tablet (81 mg total) by mouth daily.   Aspirin-Acetaminophen-Caffeine (GOODYS EXTRA STRENGTH) 500-325-65 MG PACK Take 1 Package by mouth as needed.   Aspirin-Salicylamide-Caffeine (BC HEADACHE POWDER PO) Take 1 Package by mouth as needed.   atenolol (TENORMIN) 50 MG tablet Take 1 tablet (50 mg total) by mouth daily.   b complex vitamins tablet Take 1 tablet by mouth daily.   Calcium Citrate-Vitamin D (EQ CALCIUM CITRATE+D3 PO) Take 1,200 mg by mouth daily.   Cholecalciferol (VITAMIN D-3) 5000 units TABS Take 2 tablets by mouth daily.   Coenzyme Q10 (COQ10) 200 MG CAPS Take 1 capsule by mouth daily.    doxazosin (CARDURA) 4 MG tablet TAKE 1 TABLET BY MOUTH  DAILY TO HELP CONTROL BLOOD PRESSURE   famotidine (PEPCID) 20 MG tablet Take 1 tablet (20 mg  total) by mouth daily.   fexofenadine (ALLEGRA) 180 MG tablet Take 180 mg by mouth as needed.    finasteride (PROSCAR) 5 MG tablet Take 1 tablet (5 mg total) by mouth every morning.   folic acid (FOLVITE) 1 MG tablet TAKE 1 TABLET BY MOUTH  DAILY   Multiple Vitamin (MULTIVITAMIN) tablet Take 1 tablet by mouth daily.   pantoprazole (PROTONIX) 40 MG tablet Take 1 tablet (40 mg total) by mouth daily.   Probiotic Product (PROBIOTIC DAILY PO) Take 1 capsule by mouth daily.    rosuvastatin (CRESTOR) 20 MG tablet TAKE 1 TABLET BY MOUTH ONCE DAILY FOR CHOLESTEROL   Vitamins-Lipotropics (LIPO-FLAVONOID PLUS PO) Take 1 tablet by mouth as needed (Tinnitus).   No facility-administered encounter medications on file as of 11/03/2020.    Allergies (verified) Compazine [prochlorperazine edisylate]   History: Past Medical History:  Diagnosis Date   Acute gastric ulcer without mention of hemorrhage, perforation, or obstruction    Allergic rhinitis, cause unspecified    Anemia, unspecified    Arthritis    Asthma    Blood transfusion without reported diagnosis    CAD (coronary artery disease)    Diaphragmatic hernia without mention of obstruction or gangrene    Elevated prostate specific antigen (PSA)    Enlarged prostate    Esophageal reflux    Extrinsic asthma, unspecified    Herpes zoster without mention of  complication    Hyperlipidemia    Hypersomnia with sleep apnea, unspecified    Impotence of organic origin    Kyphosis    Lumbago    Obstructive sleep apnea (adult) (pediatric)    Pneumonia    hx of years ago    Senile osteoporosis    Trigger finger (acquired)    Tubular adenoma of colon 05/2013   Unspecified essential hypertension    Unspecified sleep apnea    CPAP- settings 4-12    Unspecified vitamin D deficiency    Past Surgical History:  Procedure Laterality Date   CARDIAC CATHETERIZATION  03/04/2009   Dr [name]   COLONOSCOPY     CORONARY ARTERY BYPASS GRAFT  2010    CYSTOSCOPY WITH RETROGRADE PYELOGRAM, URETEROSCOPY AND STENT PLACEMENT Right 10/04/2013   Procedure: CYSTOSCOPY WITH RETROGRADE PYELOGRAM,  AND STENT PLACEMENT;  Surgeon: Festus Aloe, MD;  Location: [facility];  Service: Urology;  Laterality: Right;   DENTAL SURGERY N/A    MOLE REMOVAL  1958   chin   ROTATOR CUFF REPAIR Left 04/1998   with bone spur removed, Dr French Ana   TONSILLECTOMY  1945   TRANSURETHRAL RESECTION OF PROSTATE N/A 09/24/2013   Procedure: TRANSURETHRAL RESECTION OF THE PROSTATE (TURP) WITH GYRUS (STAGED RIGHT LATERAL AND MEDIAN LOBE);  Surgeon: Ailene Rud, MD;  Location: [facility];  Service: Urology;  Laterality: N/A;   UPPER GI ENDOSCOPY     Family History  Problem Relation Age of Onset   Stroke Mother    Breast cancer Mother    Cirrhosis Mother        wine   Rheumatic fever Mother    Kyphosis Mother    Hypertension Father    Stroke Father    Heart disease Father    Heart disease Paternal Grandfather    Colon cancer Neg Hx    Social History   Socioeconomic History   Marital status: Married    Spouse name: Not on file   Number of children: 0   Years of education: Not on file   Highest education level: Not on file  Occupational History   Occupation: Surveyor, quantity  Tobacco Use   Smoking status: Never   Smokeless tobacco: Never  Vaping Use   Vaping Use: Never used  Substance and Sexual Activity   Alcohol use: No   Drug use: No   Sexual activity: Not Currently  Other Topics Concern   Not on file  Social History Narrative   Not on file   Social Determinants of Health   Financial Resource Strain: Not on file  Food Insecurity: Not on file  Transportation Needs: Not on file  Physical Activity: Not on file  Stress: Not on file  Social Connections: Not on file    Tobacco Counseling Counseling given: Not Answered   Clinical Intake:                 Diabetic?no         Activities of Daily Living No flowsheet  data found.  Patient Care Team: [name], NP as PCP - General (Geriatric Medicine) [name], MD as Consulting Physician (Sports Medicine)  Indicate any recent Medical Services you may have received from other than [facility] providers in the past year (date may be approximate).     Assessment:   This is a routine wellness examination for Cloyd.  Hearing/Vision screen Hearing Screening - Comments:: Patient has no hearing problems Vision Screening - Comments:: Patient  has no vision problems. Patient has had eye exam within past year. Patient sees Dr. Clent Jacks  Dietary issues and exercise activities discussed:     Goals Addressed   None    Depression Screen PHQ 2/9 Scores 11/03/2020 10/30/2018 10/21/2018 04/21/2018 10/09/2016 07/13/2015 06/09/2014  PHQ - 2 Score 0 0 0 0 0 0 0    Fall Risk Fall Risk  11/03/2020 05/02/2020 11/02/2019 10/23/2019 04/24/2019  Falls in the past year? 0 1 0 0 1  Number falls in past yr: 1 0 0 0 0  Injury with Fall? 0 0 - 0 0  Risk for fall due to : History of fall(s) - - - -  Follow up Falls evaluation completed - - - -    FALL RISK PREVENTION PERTAINING TO THE HOME:  Any stairs in or around the home? Yes  If so, are there any without handrails? No  Home free of loose throw rugs in walkways, pet beds, electrical cords, etc? No  Adequate lighting in your home to reduce risk of falls? Yes   ASSISTIVE DEVICES UTILIZED TO PREVENT FALLS:  Life alert? No  Use of a cane, walker or w/c? No  Grab bars in the bathroom? No  Shower chair or bench in shower? No  Elevated toilet seat or a handicapped toilet? Yes   TIMED UP AND GO:  Was the test performed? No .    Cognitive Function:     6CIT Screen 10/30/2018  What Year? 0 points  What month? 0 points  What time? 0 points  Count back from 20 0 points  Months in reverse 0 points  Repeat phrase 0 points  Total Score 0    Immunizations Immunization History  Administered Date(s)  Administered   Fluad Quad(high Dose 65+) 01/25/2020   Influenza Split 01/30/2012, 02/10/2014   Influenza Whole 02/17/2009, 02/18/2013   Influenza, High Dose Seasonal PF 03/19/2018, 02/02/2019   Influenza, Quadrivalent, Recombinant, Inj, Pf 02/23/2017   Influenza,inj,Quad PF,6+ Mos 02/23/2017   Influenza-Unspecified 02/08/2014, 02/24/2015, 03/16/2016   Moderna Sars-Covid-2 Vaccination 07/21/2019, 08/18/2019, 05/03/2020, 09/28/2020   Pneumococcal Conjugate-13 04/09/2013   Pneumococcal Polysaccharide-23 10/18/2004   Td 09/29/1990   Tdap 07/25/2011   Zoster Recombinat (Shingrix) 10/09/2016    TDAP status: Up to date  Flu Vaccine status: Up to date  Pneumococcal vaccine status: Up to date  Covid-19 vaccine status: Completed vaccines  Qualifies for Shingles Vaccine? Yes   Zostavax completed No   Shingrix Completed?: No.    Education has been provided regarding the importance of this vaccine. Patient has been advised to call insurance company to determine out of pocket expense if they have not yet received this vaccine. Advised may also receive vaccine at local pharmacy or Health Dept. Verbalized acceptance and understanding.  Screening Tests Health Maintenance  Topic Date Due   Zoster Vaccines- Shingrix (2 of 2) 12/04/2016   COLONOSCOPY (Pts 45-64yrs Insurance coverage will need to be confirmed)  05/15/2020   INFLUENZA VACCINE  12/05/2020   TETANUS/TDAP  07/24/2021   COVID-19 Vaccine  Completed   PNA vac Low Risk Adult  Completed   HPV VACCINES  Aged Out    Health Maintenance  Health Maintenance Due  Topic Date Due   Zoster Vaccines- Shingrix (2 of 2) 12/04/2016   COLONOSCOPY (Pts 45-72yrs Insurance coverage will need to be confirmed)  05/15/2020    Colorectal cancer screening: No longer required.   Lung Cancer Screening: (Low Dose CT Chest recommended if Age 57-80 years,  30 pack-year currently smoking OR have quit w/in 15years.) does not qualify.   Lung Cancer Screening  Referral: na  Additional Screening:  Hepatitis C Screening: does not qualify; Completed   Vision Screening: Recommended annual ophthalmology exams for early detection of glaucoma and other disorders of the eye. Is the patient up to date with their annual eye exam?  Yes  Who is the provider or what is the name of the office in which the patient attends annual eye exams? [facility] If pt is not established with a provider, would they like to be referred to a provider to establish care? No .   Dental Screening: Recommended annual dental exams for proper oral hygiene  Community Resource Referral / Chronic Care Management: CRR required this visit?  No   CCM required this visit?  No      Plan:     I have personally reviewed and noted the following in the patient's chart:   Medical and social history Use of alcohol, tobacco or illicit drugs  Current medications and supplements including opioid prescriptions. Patient is not currently taking opioid prescriptions. Functional ability and status Nutritional status Physical activity Advanced directives List of other physicians Hospitalizations, surgeries, and ER visits in previous 12 months Vitals Screenings to include cognitive, depression, and falls Referrals and appointments  In addition, I have reviewed and discussed with patient certain preventive protocols, quality metrics, and best practice recommendations. A written personalized care plan for preventive services as well as general preventive health recommendations were provided to patient.     [name], NP   11/03/2020     Virtual Visit via Telephone Note  I connected withNAME@ on 11/03/20 at  2:15 PM EDT by telephone and verified that I am speaking with the correct person using two identifiers.  Location: Patient: home  Provider: [facility]   I discussed the limitations, risks, security and privacy concerns of performing an evaluation and management service by  telephone and the availability of in person appointments. I also discussed with the patient that there may be a patient responsible charge related to this service. The patient expressed understanding and agreed to proceed.   I discussed the assessment and treatment plan with the patient. The patient was provided an opportunity to ask questions and all were answered. The patient agreed with the plan and demonstrated an understanding of the instructions.   The patient was advised to call back or seek an in-person evaluation if the symptoms worsen or if the condition fails to improve as anticipated.  I provided 15 minutes of non-face-to-face time during this encounter.  Carlos American. Harle Battiest Avs printed and mailed

## 2020-11-03 NOTE — Progress Notes (Signed)
This service is provided via telemedicine  No vital signs collected/recorded due to the encounter was a telemedicine visit.   Location [place]ex[place], [place]  Patient consents to a telephone visit:  Yes, see encounter dated 11/03/2020  Location of the provider (ex: office, home):  [place]  Name of any referring provider:  N/A  Names of all persons participating in the telemedicine service and their role in the encounter:  Sherrie Mustache, Nurse Practitioner, Carroll Kinds, CMA, and patient.   Time spent on call:  12 minutes with medical assistant

## 2020-11-04 ENCOUNTER — Telehealth: Payer: Self-pay

## 2020-11-04 NOTE — Telephone Encounter (Signed)
   Telephone encounter was:  Unsuccessful.  11/04/2020 Name: Brent Griffith MRN: [id number] DOB: April 16, 1936  Unsuccessful outbound call made today to assist with:   food insecurity and financial strain. Left message on voicemail for patient to return my call regarding resources for food and utilities. [phone] is the only functioning number that I could leave a voicemail on.  Outreach Attempt:  1st Attempt  A HIPAA compliant voice message was left requesting a return call.  Instructed patient to call back at [phone].   [name], AAS Paralegal, [facility] [employer]  [address], [place] [address] ??[email or web address]  ?? [phone]   [email or web address]

## 2020-11-10 NOTE — Progress Notes (Signed)
HPI: FU coronary artery disease. Cardiac catheterization on March 04, 2009 revealed severe three-vessel coronary disease as well as an 80% left main. His ejection fraction was 50%. He ultimately underwent coronary artery bypass and graft on November 1 (left internal mammary artery to left anterior descending, sequential saphenous vein graft to ramus intermedius and obtuse marginal 2, saphenous vein graft to posterior descending. Echocardiogram November 2015 showed normal LV function, grade 1 diastolic dysfunction and moderate left atrial enlargement. Nuclear study April 2018 showed ejection fraction 48%. There was prior inferolateral infarct and mild apical ischemia. Since he was last seen, the patient denies any dyspnea on exertion, orthopnea, PND, pedal edema, palpitations, syncope or chest pain.   Current Outpatient Medications  Medication Sig Dispense Refill   acetaminophen (TYLENOL) 500 MG tablet Take 1,000 mg by mouth every 6 (six) hours as needed for mild pain or moderate pain.     albuterol (VENTOLIN HFA) 108 (90 Base) MCG/ACT inhaler USE 1 TO 2 INHALATIONS BY  MOUTH EVERY 6 HOURS AS  NEEDED FOR WHEEZING OR  SHORTNESS OF BREATH 34 g 3   aspirin EC 81 MG tablet Take 1 tablet (81 mg total) by mouth daily. 90 tablet 3   Aspirin-Acetaminophen-Caffeine (GOODYS EXTRA STRENGTH) 500-325-65 MG PACK Take 1 Package by mouth as needed.     Aspirin-Salicylamide-Caffeine (BC HEADACHE POWDER PO) Take 1 Package by mouth as needed.     atenolol (TENORMIN) 50 MG tablet Take 1 tablet (50 mg total) by mouth daily. 90 tablet 3   b complex vitamins tablet Take 1 tablet by mouth daily.     Calcium Citrate-Vitamin D (EQ CALCIUM CITRATE+D3 PO) Take 1,200 mg by mouth daily.     Cholecalciferol (VITAMIN D-3) 5000 units TABS Take 2 tablets by mouth daily.     Coenzyme Q10 (COQ10) 200 MG CAPS Take 1 capsule by mouth daily.      doxazosin (CARDURA) 4 MG tablet TAKE 1 TABLET BY MOUTH  DAILY TO HELP CONTROL BLOOD  PRESSURE 90 tablet 1   famotidine (PEPCID) 20 MG tablet Take 1 tablet (20 mg total) by mouth daily. 90 tablet 3   fexofenadine (ALLEGRA) 180 MG tablet Take 180 mg by mouth as needed.      finasteride (PROSCAR) 5 MG tablet Take 1 tablet (5 mg total) by mouth every morning. 90 tablet 1   folic acid (FOLVITE) 1 MG tablet TAKE 1 TABLET BY MOUTH  DAILY 90 tablet 3   Multiple Vitamin (MULTIVITAMIN) tablet Take 1 tablet by mouth daily.     pantoprazole (PROTONIX) 40 MG tablet Take 1 tablet (40 mg total) by mouth daily. 90 tablet 3   Probiotic Product (PROBIOTIC DAILY PO) Take 1 capsule by mouth daily.      rosuvastatin (CRESTOR) 20 MG tablet TAKE 1 TABLET BY MOUTH ONCE DAILY FOR CHOLESTEROL 90 tablet 3   Vitamins-Lipotropics (LIPO-FLAVONOID PLUS PO) Take 1 tablet by mouth as needed (Tinnitus).     No current facility-administered medications for this visit.     Past Medical History:  Diagnosis Date   Acute gastric ulcer without mention of hemorrhage, perforation, or obstruction    Allergic rhinitis, cause unspecified    Anemia, unspecified    Arthritis    Asthma    Blood transfusion without reported diagnosis    CAD (coronary artery disease)    Diaphragmatic hernia without mention of obstruction or gangrene    Elevated prostate specific antigen (PSA)    Enlarged prostate  Esophageal reflux    Extrinsic asthma, unspecified    Herpes zoster without mention of complication    Hyperlipidemia    Hypersomnia with sleep apnea, unspecified    Impotence of organic origin    Kyphosis    Lumbago    Obstructive sleep apnea (adult) (pediatric)    Pneumonia    hx of years ago    Senile osteoporosis    Trigger finger (acquired)    Tubular adenoma of colon 05/2013   Unspecified essential hypertension    Unspecified sleep apnea    CPAP- settings 4-12    Unspecified vitamin D deficiency     Past Surgical History:  Procedure Laterality Date   CARDIAC CATHETERIZATION  03/04/2009   Dr  [name]   COLONOSCOPY     CORONARY ARTERY BYPASS GRAFT  2010   CYSTOSCOPY WITH RETROGRADE PYELOGRAM, URETEROSCOPY AND STENT PLACEMENT Right 10/04/2013   Procedure: CYSTOSCOPY WITH RETROGRADE PYELOGRAM,  AND STENT PLACEMENT;  Surgeon: Festus Aloe, MD;  Location: [facility];  Service: Urology;  Laterality: Right;   DENTAL SURGERY N/A    MOLE REMOVAL  1958   chin   ROTATOR CUFF REPAIR Left 04/1998   with bone spur removed, Dr French Ana   TONSILLECTOMY  1945   TRANSURETHRAL RESECTION OF PROSTATE N/A 09/24/2013   Procedure: TRANSURETHRAL RESECTION OF THE PROSTATE (TURP) WITH GYRUS (STAGED RIGHT LATERAL AND MEDIAN LOBE);  Surgeon: Ailene Rud, MD;  Location: [facility];  Service: Urology;  Laterality: N/A;   UPPER GI ENDOSCOPY      Social History   Socioeconomic History   Marital status: Married    Spouse name: Not on file   Number of children: 0   Years of education: Not on file   Highest education level: Not on file  Occupational History   Occupation: Surveyor, quantity  Tobacco Use   Smoking status: Never   Smokeless tobacco: Never  Vaping Use   Vaping Use: Never used  Substance and Sexual Activity   Alcohol use: No   Drug use: No   Sexual activity: Not Currently  Other Topics Concern   Not on file  Social History Narrative   Not on file   Social Determinants of Health   Financial Resource Strain: Not on file  Food Insecurity: Not on file  Transportation Needs: Not on file  Physical Activity: Not on file  Stress: Not on file  Social Connections: Not on file  Intimate Partner Violence: Not on file    Family History  Problem Relation Age of Onset   Stroke Mother    Breast cancer Mother    Cirrhosis Mother        wine   Rheumatic fever Mother    Kyphosis Mother    Hypertension Father    Stroke Father    Heart disease Father    Heart disease Paternal Grandfather    Colon cancer Neg Hx     ROS: no fevers or chills, productive cough,  hemoptysis, dysphasia, odynophagia, melena, hematochezia, dysuria, hematuria, rash, seizure activity, orthopnea, PND, pedal edema, claudication. Remaining systems are negative.  Physical Exam: Well-developed well-nourished in no acute distress.  Skin is warm and dry.  HEENT is normal.  Neck is supple.  Chest is clear to auscultation with normal expansion.  Cardiovascular exam is regular rate and rhythm.  Abdominal exam nontender or distended. No masses palpated. Extremities show no edema. neuro grossly intact  ECG-sinus bradycardia at a rate of 57, lateral T wave inversion.  Personally reviewed  A/P  1 coronary artery disease-patient denies chest pain.  Continue medical therapy with aspirin and statin.  2 hypertension-blood pressure controlled.  Continue present medical regimen and follow.  3 hyperlipidemia-continue statin.  Kirk Ruths, MD

## 2020-11-16 ENCOUNTER — Encounter: Payer: Self-pay | Admitting: Cardiology

## 2020-11-16 ENCOUNTER — Telehealth: Payer: Self-pay

## 2020-11-16 ENCOUNTER — Ambulatory Visit (INDEPENDENT_AMBULATORY_CARE_PROVIDER_SITE_OTHER): Payer: Medicare Other | Admitting: Cardiology

## 2020-11-16 ENCOUNTER — Other Ambulatory Visit: Payer: Self-pay

## 2020-11-16 VITALS — BP 112/56 | HR 57 | Ht 64.0 in | Wt 130.8 lb

## 2020-11-16 DIAGNOSIS — I2510 Atherosclerotic heart disease of native coronary artery without angina pectoris: Secondary | ICD-10-CM | POA: Diagnosis not present

## 2020-11-16 DIAGNOSIS — E7800 Pure hypercholesterolemia, unspecified: Secondary | ICD-10-CM | POA: Diagnosis not present

## 2020-11-16 DIAGNOSIS — I10 Essential (primary) hypertension: Secondary | ICD-10-CM | POA: Diagnosis not present

## 2020-11-16 NOTE — Telephone Encounter (Signed)
   Telephone encounter was:  Unsuccessful.  11/16/2020 Name: Brent Griffith MRN: [id number] DOB: 09/13/1935  Unsuccessful outbound call made today to assist with:  Food insecurity, financial strain. Left message on voicemail for patient to return my call regarding resources for food and utilities. [phone] is the only functioning number that I could leave a voicemail on.   Outreach Attempt:  2nd Attempt  A HIPAA compliant voice message was left requesting a return call.  Instructed patient to call back at [phone].  [name], AAS Paralegal, [facility]  [address], [place] [address] ??[email or web address]  ?? [phone]   [email or web address]

## 2020-11-16 NOTE — Patient Instructions (Signed)

## 2020-11-17 ENCOUNTER — Telehealth: Payer: Self-pay

## 2020-11-17 NOTE — Telephone Encounter (Signed)
   Telephone encounter was:  Successful.  11/17/2020 Name: BRAIN HONEYCUTT MRN: [id number] DOB: 04/19/1936  Elwyn Lade [place] is a 85 y.o. year old male who is a primary care patient of [name], NP . The community resource team was consulted for assistance with Spoke with patient confirmed email address [email or web address]. Emailed list of food pantries and utility assistance as requested.  Care guide performed the following interventions: Patient provided with information about care guide support team and interviewed to confirm resource needs.  Follow Up Plan:  Care guide will follow up with patient by phone over the next 7 days  Blease Capaldi, AAS Paralegal, [facility]  [address], [place] [address] ??[email or web address]  ?? [phone]   [email or web address]

## 2020-11-21 ENCOUNTER — Telehealth: Payer: Self-pay

## 2020-11-21 NOTE — Telephone Encounter (Signed)
   Telephone encounter was:  Successful.  11/21/2020 Name: Brent Griffith MRN: [id number] DOB: 04-Feb-1936  [name] is a 85 y.o. year old male who is a primary care patient of [name], NP . The community resource team was consulted for assistance with Food Insecurity and financial.  Care guide performed the following interventions: Spoke with patient he has received the emailed resources for food and financial assistance.  No further assistance is needed at this time.  Follow Up Plan:  No further follow up planned at this time. The patient has been provided with needed resources.  Gypsy Kellogg, AAS Paralegal, [facility]  [address],  [address] ??[email or web address]  ?? [phone]   [email or web address]

## 2020-11-28 ENCOUNTER — Other Ambulatory Visit: Payer: Self-pay | Admitting: Nurse Practitioner

## 2020-11-28 DIAGNOSIS — N401 Enlarged prostate with lower urinary tract symptoms: Secondary | ICD-10-CM

## 2020-12-16 ENCOUNTER — Encounter: Payer: Self-pay | Admitting: Internal Medicine

## 2020-12-16 NOTE — Assessment & Plan Note (Signed)
Benefits from CPAP. Worked around dental extractions. Plan- change autopap range to 6-15 to address snore

## 2020-12-16 NOTE — Assessment & Plan Note (Signed)
Mild stuffiness so far this Spring. Will use flonase, antihistamine if needed

## 2020-12-21 DIAGNOSIS — E559 Vitamin D deficiency, unspecified: Secondary | ICD-10-CM | POA: Diagnosis not present

## 2020-12-21 DIAGNOSIS — M810 Age-related osteoporosis without current pathological fracture: Secondary | ICD-10-CM | POA: Diagnosis not present

## 2020-12-21 DIAGNOSIS — R5383 Other fatigue: Secondary | ICD-10-CM | POA: Diagnosis not present

## 2020-12-22 ENCOUNTER — Other Ambulatory Visit (HOSPITAL_BASED_OUTPATIENT_CLINIC_OR_DEPARTMENT_OTHER): Payer: Self-pay | Admitting: Sports Medicine

## 2020-12-22 DIAGNOSIS — Z13820 Encounter for screening for osteoporosis: Secondary | ICD-10-CM

## 2021-01-02 ENCOUNTER — Other Ambulatory Visit: Payer: Self-pay

## 2021-01-02 ENCOUNTER — Ambulatory Visit (HOSPITAL_BASED_OUTPATIENT_CLINIC_OR_DEPARTMENT_OTHER)
Admission: RE | Admit: 2021-01-02 | Discharge: 2021-01-02 | Disposition: A | Discharge status: Home / Self Care | Payer: Medicare Other | Source: Ambulatory Visit | Attending: Sports Medicine | Admitting: Sports Medicine

## 2021-01-02 DIAGNOSIS — M8589 Other specified disorders of bone density and structure, multiple sites: Secondary | ICD-10-CM | POA: Insufficient documentation

## 2021-01-02 DIAGNOSIS — Z13820 Encounter for screening for osteoporosis: Secondary | ICD-10-CM

## 2021-01-02 DIAGNOSIS — Z7983 Long term (current) use of bisphosphonates: Secondary | ICD-10-CM | POA: Diagnosis not present

## 2021-01-14 ENCOUNTER — Emergency Department (HOSPITAL_BASED_OUTPATIENT_CLINIC_OR_DEPARTMENT_OTHER): Payer: Medicare Other

## 2021-01-14 ENCOUNTER — Encounter (HOSPITAL_BASED_OUTPATIENT_CLINIC_OR_DEPARTMENT_OTHER): Payer: Self-pay | Admitting: Emergency Medicine

## 2021-01-14 ENCOUNTER — Other Ambulatory Visit: Payer: Self-pay

## 2021-01-14 ENCOUNTER — Emergency Department (HOSPITAL_BASED_OUTPATIENT_CLINIC_OR_DEPARTMENT_OTHER)
Admission: EM | Admit: 2021-01-14 | Discharge: 2021-01-14 | Disposition: A | Discharge status: Home / Self Care | Payer: Medicare Other | Attending: Emergency Medicine | Admitting: Emergency Medicine

## 2021-01-14 DIAGNOSIS — I2510 Atherosclerotic heart disease of native coronary artery without angina pectoris: Secondary | ICD-10-CM | POA: Diagnosis not present

## 2021-01-14 DIAGNOSIS — J449 Chronic obstructive pulmonary disease, unspecified: Secondary | ICD-10-CM | POA: Diagnosis not present

## 2021-01-14 DIAGNOSIS — U071 COVID-19: Secondary | ICD-10-CM | POA: Diagnosis not present

## 2021-01-14 DIAGNOSIS — J45909 Unspecified asthma, uncomplicated: Secondary | ICD-10-CM | POA: Insufficient documentation

## 2021-01-14 DIAGNOSIS — R1084 Generalized abdominal pain: Secondary | ICD-10-CM | POA: Insufficient documentation

## 2021-01-14 DIAGNOSIS — I10 Essential (primary) hypertension: Secondary | ICD-10-CM | POA: Insufficient documentation

## 2021-01-14 DIAGNOSIS — Z951 Presence of aortocoronary bypass graft: Secondary | ICD-10-CM | POA: Insufficient documentation

## 2021-01-14 DIAGNOSIS — Z7982 Long term (current) use of aspirin: Secondary | ICD-10-CM | POA: Diagnosis not present

## 2021-01-14 DIAGNOSIS — R059 Cough, unspecified: Secondary | ICD-10-CM | POA: Diagnosis not present

## 2021-01-14 DIAGNOSIS — R109 Unspecified abdominal pain: Secondary | ICD-10-CM | POA: Diagnosis not present

## 2021-01-14 LAB — RESP PANEL BY RT-PCR (FLU A&B, COVID) ARPGX2
Influenza A by PCR: NEGATIVE
Influenza B by PCR: NEGATIVE
SARS Coronavirus 2 by RT PCR: POSITIVE — AB

## 2021-01-14 LAB — COMPREHENSIVE METABOLIC PANEL
ALT: 20 U/L (ref 0–44)
AST: 26 U/L (ref 15–41)
Albumin: 3.7 g/dL (ref 3.5–5.0)
Alkaline Phosphatase: 44 U/L (ref 38–126)
Anion gap: 7 (ref 5–15)
BUN: 23 mg/dL (ref 8–23)
CO2: 25 mmol/L (ref 22–32)
Calcium: 8.8 mg/dL — ABNORMAL LOW (ref 8.9–10.3)
Chloride: 106 mmol/L (ref 98–111)
Creatinine, Ser: 1.15 mg/dL (ref 0.61–1.24)
GFR, Estimated: >60 mL/min (ref >60)
Glucose, Bld: 108 mg/dL — ABNORMAL HIGH (ref 70–99)
Potassium: 4.2 mmol/L (ref 3.5–5.1)
Sodium: 138 mmol/L (ref 135–145)
Total Bilirubin: 1.6 mg/dL — ABNORMAL HIGH (ref 0.3–1.2)
Total Protein: 6.8 g/dL (ref 6.5–8.1)

## 2021-01-14 LAB — CBC WITH DIFFERENTIAL/PLATELET
Abs Immature Granulocytes: 0.02 10*3/uL (ref 0.00–0.07)
Basophils Absolute: 0 10*3/uL (ref 0.0–0.1)
Basophils Relative: 0 %
Eosinophils Absolute: 0 10*3/uL (ref 0.0–0.5)
Eosinophils Relative: 0 %
HCT: 34.6 % — ABNORMAL LOW (ref 39.0–52.0)
Hemoglobin: 11.3 g/dL — ABNORMAL LOW (ref 13.0–17.0)
Immature Granulocytes: 0 %
Lymphocytes Relative: 8 %
Lymphs Abs: 0.4 10*3/uL — ABNORMAL LOW (ref 0.7–4.0)
MCH: 29.6 pg (ref 26.0–34.0)
MCHC: 32.7 g/dL (ref 30.0–36.0)
MCV: 90.6 fL (ref 80.0–100.0)
Monocytes Absolute: 0.8 10*3/uL (ref 0.1–1.0)
Monocytes Relative: 17 %
Neutro Abs: 3.5 10*3/uL (ref 1.7–7.7)
Neutrophils Relative %: 75 %
Platelets: 134 10*3/uL — ABNORMAL LOW (ref 150–400)
RBC: 3.82 MIL/uL — ABNORMAL LOW (ref 4.22–5.81)
RDW: 12.6 % (ref 11.5–15.5)
WBC: 4.7 10*3/uL (ref 4.0–10.5)
nRBC: 0 % (ref 0.0–0.2)

## 2021-01-14 LAB — LACTIC ACID, PLASMA: Lactic Acid, Venous: 1 mmol/L (ref 0.5–1.9)

## 2021-01-14 MED ORDER — IOHEXOL 350 MG/ML SOLN
100.0000 mL | Freq: Once | INTRAVENOUS | Status: AC | PRN
  Administered 2021-01-14: 85 mL via INTRAVENOUS

## 2021-01-14 MED ORDER — NIRMATRELVIR/RITONAVIR (PAXLOVID)TABLET: 3.0000 | ORAL_TABLET | Freq: Two times a day (BID) | ORAL | 0 refills | Status: DC

## 2021-01-14 MED ORDER — SODIUM CHLORIDE 0.9 % IV BOLUS
1000.0000 mL | Freq: Once | INTRAVENOUS | Status: AC
  Administered 2021-01-14: 1000 mL via INTRAVENOUS

## 2021-01-14 NOTE — ED Notes (Signed)
Patient transported to CT 

## 2021-01-14 NOTE — Discharge Instructions (Addendum)
Your CT scan today was overall unremarkable.  Suspect your symptoms are secondary to COVID.  Take antiviral as prescribed.

## 2021-01-14 NOTE — ED Triage Notes (Signed)
Abdominal pain x 1 day , nausea, fever.   Dry cough x 2 days Denies shortness of breath.

## 2021-01-14 NOTE — ED Provider Notes (Signed)
[facility] EMERGENCY DEPARTMENT Provider Note   CSN: [id number] Arrival date & time: 01/14/21  1218     History Chief Complaint  Patient presents with   Abdominal Pain    Brent Griffith is a 85 y.o. male.  The history is provided by the patient.  Abdominal Pain Pain location:  Generalized Pain quality: aching   Pain radiates to:  Does not radiate Pain severity:  Mild Onset quality:  Gradual Duration:  2 days Timing:  Intermittent Progression:  Waxing and waning Chronicity:  New Context: sick contacts (wife with covid a few weeks ago, patient got second covid booster 3 days ago)   Relieved by:  Nothing Worsened by:  Nothing Associated symptoms: cough and fever   Associated symptoms: no chest pain, no chills, no dysuria, no hematuria, no shortness of breath, no sore throat and no vomiting       Past Medical History:  Diagnosis Date   Acute gastric ulcer without mention of hemorrhage, perforation, or obstruction    Allergic rhinitis, cause unspecified    Anemia, unspecified    Arthritis    Asthma    Blood transfusion without reported diagnosis    CAD (coronary artery disease)    Diaphragmatic hernia without mention of obstruction or gangrene    Elevated prostate specific antigen (PSA)    Enlarged prostate    Esophageal reflux    Extrinsic asthma, unspecified    Herpes zoster without mention of complication    Hyperlipidemia    Hypersomnia with sleep apnea, unspecified    Impotence of organic origin    Kyphosis    Lumbago    Obstructive sleep apnea (adult) (pediatric)    Pneumonia    hx of years ago    Senile osteoporosis    Trigger finger (acquired)    Tubular adenoma of colon 05/2013   Unspecified essential hypertension    Unspecified sleep apnea    CPAP- settings 4-12    Unspecified vitamin D deficiency     Patient Active Problem List   Diagnosis Date Noted   CAD (coronary artery disease) 10/11/2017   IDA (iron deficiency anemia)  08/31/2016   Seasonal and perennial allergic rhinitis 08/28/2016   Palpitations 09/07/2015   Cough, persistent 07/20/2015   Senile osteoporosis 06/09/2014   Shortness of breath 03/20/2014   Hydronephrosis of right kidney 10/03/2013   Benign prostatic hyperplasia 09/24/2013   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   Dyslipidemia 04/06/2009   CARDIOVASCULAR FUNCTION STUDY, ABNORMAL 03/03/2009   Obstructive sleep apnea 02/04/2009   Essential hypertension 09/02/2008   G E R D 09/02/2008   PERIODIC LIMB MOVEMENT DISORDER 09/03/2007    Past Surgical History:  Procedure Laterality Date   CARDIAC CATHETERIZATION  03/04/2009   Dr [name]   COLONOSCOPY     CORONARY ARTERY BYPASS GRAFT  2010   CYSTOSCOPY WITH RETROGRADE PYELOGRAM, URETEROSCOPY AND STENT PLACEMENT Right 10/04/2013   Procedure: CYSTOSCOPY WITH RETROGRADE PYELOGRAM,  AND STENT PLACEMENT;  Surgeon: Festus Aloe, MD;  Location: [facility];  Service: Urology;  Laterality: Right;   DENTAL SURGERY N/A    MOLE REMOVAL  1958   chin   ROTATOR CUFF REPAIR Left 04/1998   with bone spur removed, Dr French Ana   TONSILLECTOMY  1945   TRANSURETHRAL RESECTION OF PROSTATE N/A 09/24/2013   Procedure: TRANSURETHRAL RESECTION OF THE PROSTATE (TURP) WITH GYRUS (STAGED RIGHT LATERAL AND MEDIAN LOBE);  Surgeon: Ailene Rud, MD;  Location: [facility];  Service: Urology;  Laterality: N/A;  UPPER GI ENDOSCOPY         Family History  Problem Relation Age of Onset   Stroke Mother    Breast cancer Mother    Cirrhosis Mother        wine   Rheumatic fever Mother    Kyphosis Mother    Hypertension Father    Stroke Father    Heart disease Father    Heart disease Paternal Grandfather    Colon cancer Neg Hx     Social History   Tobacco Use   Smoking status: Never   Smokeless tobacco: Never  Vaping Use   Vaping Use: Never used  Substance Use Topics   Alcohol use: No   Drug use: No    Home Medications Prior to Admission medications    Medication Sig Start Date End Date Taking? Authorizing Provider  nirmatrelvir/ritonavir EUA (PAXLOVID) 20 x 150 MG & 10 x '100MG'$  TABS Take 3 tablets by mouth 2 (two) times daily for 5 days. Patient GFR is >60. Take nirmatrelvir (150 mg) two tablets twice daily for 5 days and ritonavir (100 mg) one tablet twice daily for 5 days. 01/14/21 01/19/21 Yes Margaretmary Prisk, DO  acetaminophen (TYLENOL) 500 MG tablet Take 1,000 mg by mouth every 6 (six) hours as needed for mild pain or moderate pain.    [provider]  albuterol (VENTOLIN HFA) 108 (90 Base) MCG/ACT inhaler USE 1 TO 2 INHALATIONS BY  MOUTH EVERY 6 HOURS AS  NEEDED FOR WHEEZING OR  SHORTNESS OF BREATH 03/03/20   [name], NP  aspirin EC 81 MG tablet Take 1 tablet (81 mg total) by mouth daily. 03/05/18   Lelon Perla, MD  Aspirin-Acetaminophen-Caffeine (GOODYS EXTRA STRENGTH) [phone] MG PACK Take 1 Package by mouth as needed.    [provider]  Aspirin-Salicylamide-Caffeine (BC HEADACHE POWDER PO) Take 1 Package by mouth as needed.    [provider]  atenolol (TENORMIN) 50 MG tablet Take 1 tablet (50 mg total) by mouth daily. 10/31/20   [name], NP  b complex vitamins tablet Take 1 tablet by mouth daily.    [provider]  Calcium Citrate-Vitamin D (EQ CALCIUM CITRATE+D3 PO) Take 1,200 mg by mouth daily.    [provider]  Cholecalciferol (VITAMIN D-3) 5000 units TABS Take 2 tablets by mouth daily.    [provider]  Coenzyme Q10 (COQ10) 200 MG CAPS Take 1 capsule by mouth daily.     [provider]  doxazosin (CARDURA) 4 MG tablet TAKE 1 TABLET BY MOUTH  DAILY TO HELP CONTROL BLOOD PRESSURE 10/31/20   [name], NP  famotidine (PEPCID) 20 MG tablet Take 1 tablet (20 mg total) by mouth daily. 10/31/20   [name], NP  fexofenadine (ALLEGRA) 180 MG tablet Take 180 mg by mouth as needed.     [provider]  finasteride (PROSCAR) 5  MG tablet TAKE 1 TABLET BY MOUTH  EVERY MORNING 11/28/20   [name], NP  folic acid (FOLVITE) 1 MG tablet TAKE 1 TABLET BY MOUTH  DAILY 03/21/20   [name], NP  Multiple Vitamin (MULTIVITAMIN) tablet Take 1 tablet by mouth daily.    [provider]  pantoprazole (PROTONIX) 40 MG tablet Take 1 tablet (40 mg total) by mouth daily. 10/31/20   [name], NP  Probiotic Product (PROBIOTIC DAILY PO) Take 1 capsule by mouth daily.     [provider]  rosuvastatin (CRESTOR)  20 MG tablet TAKE 1 TABLET BY MOUTH ONCE DAILY FOR CHOLESTEROL 03/21/20   [name], NP  Vitamins-Lipotropics (LIPO-FLAVONOID PLUS PO) Take 1 tablet by mouth as needed (Tinnitus).    [provider]    Allergies    Compazine [prochlorperazine edisylate]  Review of Systems   Review of Systems  Constitutional:  Positive for fever. Negative for chills.  HENT:  Negative for ear pain and sore throat.   Eyes:  Negative for pain and visual disturbance.  Respiratory:  Positive for cough. Negative for shortness of breath.   Cardiovascular:  Negative for chest pain and palpitations.  Gastrointestinal:  Positive for abdominal pain. Negative for vomiting.  Genitourinary:  Negative for dysuria and hematuria.  Musculoskeletal:  Negative for arthralgias and back pain.  Skin:  Negative for color change and rash.  Neurological:  Negative for seizures and syncope.  All other systems reviewed and are negative.  Physical Exam Updated Vital Signs BP 125/60 (BP Location: Left Arm)   Pulse 76   Temp 99.2 F (37.3 C) (Oral)   Resp 14   Ht 5' 3.5" (1.613 m)   Wt 56.7 kg   SpO2 100%   BMI 21.80 kg/m   Physical Exam Vitals and nursing note reviewed.  Constitutional:      General: He is not in acute distress.    Appearance: He is well-developed. He is not ill-appearing.  HENT:     Head: Normocephalic and atraumatic.  Eyes:     Extraocular Movements: Extraocular movements  intact.     Conjunctiva/sclera: Conjunctivae normal.     Pupils: Pupils are equal, round, and reactive to light.  Cardiovascular:     Rate and Rhythm: Normal rate and regular rhythm.     Heart sounds: Normal heart sounds. No murmur heard. Pulmonary:     Effort: Pulmonary effort is normal. No respiratory distress.     Breath sounds: Normal breath sounds.  Abdominal:     General: Abdomen is flat. There is no distension.     Palpations: Abdomen is soft.     Tenderness: There is generalized abdominal tenderness. There is no guarding.     Hernia: No hernia is present.  Musculoskeletal:     Cervical back: Neck supple.  Skin:    General: Skin is warm and dry.     Capillary Refill: Capillary refill takes less than 2 seconds.  Neurological:     General: No focal deficit present.     Mental Status: He is alert.  Psychiatric:        Mood and Affect: Mood normal.    ED Results / Procedures / Treatments   Labs (all labs ordered are listed, but only abnormal results are displayed) Labs Reviewed  RESP PANEL BY RT-PCR (FLU A&B, COVID) ARPGX2 - Abnormal; Notable for the following components:      Result Value   SARS Coronavirus 2 by RT PCR POSITIVE (*)    All other components within normal limits  COMPREHENSIVE METABOLIC PANEL - Abnormal; Notable for the following components:   Glucose, Bld 108 (*)    Calcium 8.8 (*)    Total Bilirubin 1.6 (*)    All other components within normal limits  CBC WITH DIFFERENTIAL/PLATELET - Abnormal; Notable for the following components:   RBC 3.82 (*)    Hemoglobin 11.3 (*)    HCT 34.6 (*)    Platelets 134 (*)    Lymphs Abs 0.4 (*)    All other components within normal  limits  CULTURE, BLOOD (SINGLE)  LACTIC ACID, PLASMA    EKG EKG Interpretation  Date/Time:  Saturday January 14 2021 13:09:27 EDT Ventricular Rate:  60 PR Interval:  159 QRS Duration: 118 QT Interval:  392 QTC Calculation: 392 R Axis:   -2 Text Interpretation: Sinus rhythm  Ventricular premature complex Incomplete left bundle branch block Confirmed by Lennice Sites ([id number]) on 01/14/2021 1:29:25 PM  Radiology CT ABDOMEN PELVIS W CONTRAST  Result Date: 01/14/2021 CLINICAL DATA:  Abdominal pain and fever. EXAM: CT ABDOMEN AND PELVIS WITH CONTRAST TECHNIQUE: Multidetector CT imaging of the abdomen and pelvis was performed using the standard protocol following bolus administration of intravenous contrast. CONTRAST:  78m OMNIPAQUE IOHEXOL 350 MG/ML SOLN COMPARISON:  CT abdomen pelvis dated August 13, 2016. FINDINGS: Lower chest: No acute abnormality. Hepatobiliary: Few scattered subcentimeter low-density lesions within the liver are unchanged but remain too small to characterize. No new focal liver abnormality. Gallstones again noted. No gallbladder wall thickening or biliary dilatation. Pancreas: Unremarkable. No pancreatic ductal dilatation or surrounding inflammatory changes. Spleen: Few punctate calcified granulomas again noted. Normal in size. Adrenals/Urinary Tract: Adrenal glands and left kidney are unremarkable. Slight interval increase in size of the small simple cyst in the right kidney. No hydronephrosis. Mild circumferential bladder wall thickening, similar to prior study. Stomach/Bowel: Chronic gastric wall thickening. No obstruction. Sigmoid diverticulosis. Normal appendix. Vascular/Lymphatic: Aortic atherosclerosis. No enlarged abdominal or pelvic lymph nodes. Reproductive: Unchanged prostatomegaly with median lobe hypertrophy indenting the bladder base. Other: New trace free fluid in the pelvis.  No pneumoperitoneum. Musculoskeletal: No acute or significant osseous findings. IMPRESSION: 1. New trace free fluid in the pelvis, nonspecific. 2. Chronic gastric wall thickening, not significantly changed since 2015 and potentially in part due to underdistension. Correlate for gastritis. 3. Unchanged cholelithiasis. 4. Aortic Atherosclerosis (ICD10-I70.0). Electronically Signed    By: WTitus DubinM.D.   On: 01/14/2021 15:03   DG Chest Port 1 View  Result Date: 01/14/2021 CLINICAL DATA:  Cough and abdomen pain for 2 days. EXAM: PORTABLE CHEST 1 VIEW COMPARISON:  January 28, 2017 FINDINGS: The heart size and mediastinal contours are stable. Mild linear atelectasis or scar is identified in the left lung base. The lungs are otherwise clear. The lungs are hyperinflated. The visualized skeletal structures are unremarkable. IMPRESSION: No active cardiopulmonary disease. COPD. Electronically Signed   By: WAbelardo DieselM.D.   On: 01/14/2021 13:29    Procedures Procedures   Medications Ordered in ED Medications  sodium chloride 0.9 % bolus 1,000 mL ( Intravenous Stopped 01/14/21 1435)  iohexol (OMNIPAQUE) 350 MG/ML injection 100 mL (85 mLs Intravenous Contrast Given 01/14/21 1406)    ED Course  I have reviewed the triage vital signs and the nursing notes.  Pertinent labs & imaging results that were available during my care of the patient were reviewed by me and considered in my medical decision making (see chart for details).    MDM Rules/Calculators/A&P                           JOrlene Ermis here with abdominal pain and fever and cough.  Overall unremarkable vitals.  No fever.  Fever last night but no fever this morning.  Did have his second COVID booster 3 days ago.  Wife was sick with COVID several weeks ago but he tested negative.  Has had a cough for the last several days and abdominal pain since yesterday.  Overall diffuse  abdominal pain.  Denies any pain with urination.  No fever here.  We will pursue infectious work-up with lab work including chest x-ray, COVID test, CT scan abdomen pelvis and urinalysis.  Possible that this is a side effect from COVID-vaccine.  Will rule out infectious process.  No significant leukocytosis, anemia, electrolyte abnormality.  CT scan overall unremarkable.  No acute infectious process.  Chest x-ray with no evidence of infection.   COVID test however was positive and likely the source for his fever.  We will write for antiviral.  Discharged in good condition.  Understands return precautions.  This chart was dictated using voice recognition software.  Despite best efforts to proofread,  errors can occur which can change the documentation meaning.   Final Clinical Impression(s) / ED Diagnoses Final diagnoses:  COVID-19    Rx / DC Orders ED Discharge Orders          Ordered    nirmatrelvir/ritonavir EUA (PAXLOVID) 20 x 150 MG & 10 x '100MG'$  TABS  2 times daily        01/14/21 1408             Jaqwan Wieber, DO 01/14/21 1508

## 2021-01-16 ENCOUNTER — Telehealth (HOSPITAL_BASED_OUTPATIENT_CLINIC_OR_DEPARTMENT_OTHER): Payer: Self-pay | Admitting: Emergency Medicine

## 2021-01-16 ENCOUNTER — Emergency Department (HOSPITAL_COMMUNITY)
Admission: EM | Admit: 2021-01-16 | Discharge: 2021-01-16 | Disposition: A | Discharge status: Home / Self Care | Payer: Medicare Other | Attending: Emergency Medicine | Admitting: Emergency Medicine

## 2021-01-16 ENCOUNTER — Encounter (HOSPITAL_COMMUNITY): Payer: Self-pay

## 2021-01-16 ENCOUNTER — Emergency Department (HOSPITAL_COMMUNITY): Payer: Medicare Other

## 2021-01-16 ENCOUNTER — Other Ambulatory Visit: Payer: Self-pay

## 2021-01-16 DIAGNOSIS — I2510 Atherosclerotic heart disease of native coronary artery without angina pectoris: Secondary | ICD-10-CM | POA: Diagnosis not present

## 2021-01-16 DIAGNOSIS — J9811 Atelectasis: Secondary | ICD-10-CM | POA: Diagnosis not present

## 2021-01-16 DIAGNOSIS — R059 Cough, unspecified: Secondary | ICD-10-CM | POA: Diagnosis not present

## 2021-01-16 DIAGNOSIS — Z79899 Other long term (current) drug therapy: Secondary | ICD-10-CM | POA: Insufficient documentation

## 2021-01-16 DIAGNOSIS — J45909 Unspecified asthma, uncomplicated: Secondary | ICD-10-CM | POA: Diagnosis not present

## 2021-01-16 DIAGNOSIS — Z951 Presence of aortocoronary bypass graft: Secondary | ICD-10-CM | POA: Insufficient documentation

## 2021-01-16 DIAGNOSIS — Z7982 Long term (current) use of aspirin: Secondary | ICD-10-CM | POA: Diagnosis not present

## 2021-01-16 DIAGNOSIS — U071 COVID-19: Secondary | ICD-10-CM | POA: Insufficient documentation

## 2021-01-16 DIAGNOSIS — R7881 Bacteremia: Secondary | ICD-10-CM

## 2021-01-16 DIAGNOSIS — I10 Essential (primary) hypertension: Secondary | ICD-10-CM | POA: Diagnosis not present

## 2021-01-16 LAB — BLOOD CULTURE ID PANEL (REFLEXED) - BCID2

## 2021-01-16 LAB — CBC WITH DIFFERENTIAL/PLATELET
Abs Immature Granulocytes: 0.03 10*3/uL (ref 0.00–0.07)
Basophils Absolute: 0 10*3/uL (ref 0.0–0.1)
Basophils Relative: 0 %
Eosinophils Absolute: 0.1 10*3/uL (ref 0.0–0.5)
Eosinophils Relative: 1 %
HCT: 34.8 % — ABNORMAL LOW (ref 39.0–52.0)
Hemoglobin: 11.5 g/dL — ABNORMAL LOW (ref 13.0–17.0)
Immature Granulocytes: 1 %
Lymphocytes Relative: 9 %
Lymphs Abs: 0.6 10*3/uL — ABNORMAL LOW (ref 0.7–4.0)
MCH: 29.9 pg (ref 26.0–34.0)
MCHC: 33 g/dL (ref 30.0–36.0)
MCV: 90.6 fL (ref 80.0–100.0)
Monocytes Absolute: 0.7 10*3/uL (ref 0.1–1.0)
Monocytes Relative: 11 %
Neutro Abs: 4.9 10*3/uL (ref 1.7–7.7)
Neutrophils Relative %: 78 %
Platelets: 131 10*3/uL — ABNORMAL LOW (ref 150–400)
RBC: 3.84 MIL/uL — ABNORMAL LOW (ref 4.22–5.81)
RDW: 12.6 % (ref 11.5–15.5)
WBC: 6.2 10*3/uL (ref 4.0–10.5)
nRBC: 0 % (ref 0.0–0.2)

## 2021-01-16 LAB — COMPREHENSIVE METABOLIC PANEL
ALT: 34 U/L (ref 0–44)
AST: 55 U/L — ABNORMAL HIGH (ref 15–41)
Albumin: 3.7 g/dL (ref 3.5–5.0)
Alkaline Phosphatase: 45 U/L (ref 38–126)
Anion gap: 11 (ref 5–15)
BUN: 23 mg/dL (ref 8–23)
CO2: 20 mmol/L — ABNORMAL LOW (ref 22–32)
Calcium: 8.7 mg/dL — ABNORMAL LOW (ref 8.9–10.3)
Chloride: 104 mmol/L (ref 98–111)
Creatinine, Ser: 0.95 mg/dL (ref 0.61–1.24)
GFR, Estimated: >60 mL/min (ref >60)
Glucose, Bld: 117 mg/dL — ABNORMAL HIGH (ref 70–99)
Potassium: 3.5 mmol/L (ref 3.5–5.1)
Sodium: 135 mmol/L (ref 135–145)
Total Bilirubin: 1.7 mg/dL — ABNORMAL HIGH (ref 0.3–1.2)
Total Protein: 6.7 g/dL (ref 6.5–8.1)

## 2021-01-16 LAB — LACTIC ACID, PLASMA: Lactic Acid, Venous: 1 mmol/L (ref 0.5–1.9)

## 2021-01-16 MED ORDER — AMOXICILLIN-POT CLAVULANATE 875-125 MG PO TABS: 1.0000 | ORAL_TABLET | Freq: Two times a day (BID) | ORAL | 0 refills | Status: DC

## 2021-01-16 MED ORDER — SODIUM CHLORIDE 0.9 % IV SOLN
1.0000 g | Freq: Once | INTRAVENOUS | Status: AC
  Administered 2021-01-16: 1 g via INTRAVENOUS
  Filled 2021-01-16: qty 10

## 2021-01-16 MED ORDER — ONDANSETRON HCL 4 MG PO TABS: 4.0000 mg | ORAL_TABLET | Freq: Four times a day (QID) | ORAL | 0 refills | Status: DC

## 2021-01-16 NOTE — Discharge Instructions (Addendum)
Return for any problem.  ?

## 2021-01-16 NOTE — ED Provider Notes (Signed)
[facility] DEPT Provider Note   CSN: [id number] Arrival date & time: 01/16/21  [id number]     History Chief Complaint  Patient presents with   Abnormal Lab    Brent Griffith is a 85 y.o. male.  85 year old male with prior medical history as detailed below presents for evaluation.  Patient was previously seen on September 10th.  He was diagnosed with COVID and started on antiviral at that time.  Blood culture was obtained at that visit.  This culture was positive for strep.  Patient was called and told to return to the ED this morning.  Patient reports that he is feeling improved since his visit on the 10th.  The history is provided by the patient.  Abnormal Lab Time since result:  Positive blood culture     Past Medical History:  Diagnosis Date   Acute gastric ulcer without mention of hemorrhage, perforation, or obstruction    Allergic rhinitis, cause unspecified    Anemia, unspecified    Arthritis    Asthma    Blood transfusion without reported diagnosis    CAD (coronary artery disease)    Diaphragmatic hernia without mention of obstruction or gangrene    Elevated prostate specific antigen (PSA)    Enlarged prostate    Esophageal reflux    Extrinsic asthma, unspecified    Herpes zoster without mention of complication    Hyperlipidemia    Hypersomnia with sleep apnea, unspecified    Impotence of organic origin    Kyphosis    Lumbago    Obstructive sleep apnea (adult) (pediatric)    Pneumonia    hx of years ago    Senile osteoporosis    Trigger finger (acquired)    Tubular adenoma of colon 05/2013   Unspecified essential hypertension    Unspecified sleep apnea    CPAP- settings 4-12    Unspecified vitamin D deficiency     Patient Active Problem List   Diagnosis Date Noted   CAD (coronary artery disease) 10/11/2017   IDA (iron deficiency anemia) 08/31/2016   Seasonal and perennial allergic rhinitis 08/28/2016   Palpitations  09/07/2015   Cough, persistent 07/20/2015   Senile osteoporosis 06/09/2014   Shortness of breath 03/20/2014   Hydronephrosis of right kidney 10/03/2013   Benign prostatic hyperplasia 09/24/2013   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   Dyslipidemia 04/06/2009   CARDIOVASCULAR FUNCTION STUDY, ABNORMAL 03/03/2009   Obstructive sleep apnea 02/04/2009   Essential hypertension 09/02/2008   G E R D 09/02/2008   PERIODIC LIMB MOVEMENT DISORDER 09/03/2007    Past Surgical History:  Procedure Laterality Date   CARDIAC CATHETERIZATION  03/04/2009   Dr [name]   COLONOSCOPY     CORONARY ARTERY BYPASS GRAFT  2010   CYSTOSCOPY WITH RETROGRADE PYELOGRAM, URETEROSCOPY AND STENT PLACEMENT Right 10/04/2013   Procedure: CYSTOSCOPY WITH RETROGRADE PYELOGRAM,  AND STENT PLACEMENT;  Surgeon: Festus Aloe, MD;  Location: [facility];  Service: Urology;  Laterality: Right;   DENTAL SURGERY N/A    MOLE REMOVAL  1958   chin   ROTATOR CUFF REPAIR Left 04/1998   with bone spur removed, Dr French Ana   TONSILLECTOMY  1945   TRANSURETHRAL RESECTION OF PROSTATE N/A 09/24/2013   Procedure: TRANSURETHRAL RESECTION OF THE PROSTATE (TURP) WITH GYRUS (STAGED RIGHT LATERAL AND MEDIAN LOBE);  Surgeon: Ailene Rud, MD;  Location: [facility];  Service: Urology;  Laterality: N/A;   UPPER GI ENDOSCOPY         Family  History  Problem Relation Age of Onset   Stroke Mother    Breast cancer Mother    Cirrhosis Mother        wine   Rheumatic fever Mother    Kyphosis Mother    Hypertension Father    Stroke Father    Heart disease Father    Heart disease Paternal Grandfather    Colon cancer Neg Hx     Social History   Tobacco Use   Smoking status: Never   Smokeless tobacco: Never  Vaping Use   Vaping Use: Never used  Substance Use Topics   Alcohol use: No   Drug use: No    Home Medications Prior to Admission medications   Medication Sig Start Date End Date Taking? Authorizing Provider  acetaminophen (TYLENOL)  500 MG tablet Take 1,000 mg by mouth every 6 (six) hours as needed for mild pain or moderate pain.   Yes [provider]  albuterol (VENTOLIN HFA) 108 (90 Base) MCG/ACT inhaler USE 1 TO 2 INHALATIONS BY  MOUTH EVERY 6 HOURS AS  NEEDED FOR WHEEZING OR  SHORTNESS OF BREATH Patient taking differently: Inhale 1-2 puffs into the lungs every 6 (six) hours as needed for wheezing or shortness of breath. 03/03/20  Yes [name], NP  amoxicillin-clavulanate (AUGMENTIN) 875-125 MG tablet Take 1 tablet by mouth every 12 (twelve) hours. 01/16/21  Yes Valarie Merino, MD  aspirin EC 81 MG tablet Take 1 tablet (81 mg total) by mouth daily. 03/05/18  Yes Lelon Perla, MD  Aspirin-Acetaminophen-Caffeine (GOODYS EXTRA STRENGTH) [id number] MG PACK Take 1 Package by mouth 2 (two) times daily as needed (pain/headache).   Yes [provider]  Aspirin-Salicylamide-Caffeine (BC HEADACHE POWDER PO) Take 1 Package by mouth 2 (two) times daily as needed (headache/pain).   Yes [provider]  atenolol (TENORMIN) 50 MG tablet Take 1 tablet (50 mg total) by mouth daily. Patient taking differently: Take 50 mg by mouth every evening. 10/31/20  Yes [name], NP  b complex vitamins tablet Take 1 tablet by mouth daily.   Yes [provider]  Calcium Citrate-Vitamin D (EQ CALCIUM CITRATE+D3 PO) Take 1,200 mg by mouth daily.   Yes [provider]  Cholecalciferol (VITAMIN D-3) 5000 units TABS Take 10,000 Units by mouth daily.   Yes [provider]  Coenzyme Q10 (COQ10) 200 MG CAPS Take 200 mg by mouth daily.   Yes [provider]  doxazosin (CARDURA) 4 MG tablet TAKE 1 TABLET BY MOUTH  DAILY TO HELP CONTROL BLOOD PRESSURE Patient taking differently: Take 4 mg by mouth daily. 10/31/20  Yes [name], NP  famotidine (PEPCID) 20 MG tablet Take 1 tablet (20 mg total) by mouth daily. 10/31/20  Yes [name], NP  fexofenadine (ALLEGRA) 180  MG tablet Take 180 mg by mouth daily as needed for allergies.   Yes [provider]  finasteride (PROSCAR) 5 MG tablet TAKE 1 TABLET BY MOUTH  EVERY MORNING Patient taking differently: Take 5 mg by mouth daily. 11/28/20  Yes [name], NP  folic acid (FOLVITE) 1 MG tablet TAKE 1 TABLET BY MOUTH  DAILY Patient taking differently: Take 1 mg by mouth daily. 03/21/20  Yes [name], NP  Multiple Vitamin (MULTIVITAMIN) tablet Take 1 tablet by mouth daily.   Yes [provider]  nirmatrelvir/ritonavir EUA (PAXLOVID) 20 x 150 MG & 10 x '100MG'$  TABS Take 3 tablets by mouth 2 (two) times daily  for 5 days. Patient GFR is >60. Take nirmatrelvir (150 mg) two tablets twice daily for 5 days and ritonavir (100 mg) one tablet twice daily for 5 days. 01/14/21 01/19/21 Yes Curatolo, Adam, DO  pantoprazole (PROTONIX) 40 MG tablet Take 1 tablet (40 mg total) by mouth daily. 10/31/20  Yes [name], NP  rosuvastatin (CRESTOR) 20 MG tablet TAKE 1 TABLET BY MOUTH ONCE DAILY FOR CHOLESTEROL Patient taking differently: Take 20 mg by mouth every evening. 03/21/20  Yes [name], NP    Allergies    Compazine [prochlorperazine edisylate]  Review of Systems   Review of Systems  All other systems reviewed and are negative.  Physical Exam Updated Vital Signs BP (!) 172/77   Pulse 73   Temp 98.6 F (37 C) (Oral)   Resp (!) 21   Ht 5' 3.5" (1.613 m)   Wt 57.2 kg   SpO2 97%   BMI 21.97 kg/m   Physical Exam Vitals and nursing note reviewed.  Constitutional:      General: He is not in acute distress.    Appearance: Normal appearance. He is well-developed.  HENT:     Head: Normocephalic and atraumatic.  Eyes:     Conjunctiva/sclera: Conjunctivae normal.     Pupils: Pupils are equal, round, and reactive to light.  Cardiovascular:     Rate and Rhythm: Normal rate and regular rhythm.     Heart sounds: Normal heart sounds.  Pulmonary:     Effort: Pulmonary effort  is normal. No respiratory distress.     Breath sounds: Normal breath sounds.  Abdominal:     General: There is no distension.     Palpations: Abdomen is soft.     Tenderness: There is no abdominal tenderness.  Musculoskeletal:        General: No deformity. Normal range of motion.     Cervical back: Normal range of motion and neck supple.  Skin:    General: Skin is warm and dry.  Neurological:     General: No focal deficit present.     Mental Status: He is alert and oriented to person, place, and time.    ED Results / Procedures / Treatments   Labs (all labs ordered are listed, but only abnormal results are displayed) Labs Reviewed  COMPREHENSIVE METABOLIC PANEL - Abnormal; Notable for the following components:      Result Value   CO2 20 (*)    Glucose, Bld 117 (*)    Calcium 8.7 (*)    AST 55 (*)    Total Bilirubin 1.7 (*)    All other components within normal limits  CBC WITH DIFFERENTIAL/PLATELET - Abnormal; Notable for the following components:   RBC 3.84 (*)    Hemoglobin 11.5 (*)    HCT 34.8 (*)    Platelets 131 (*)    Lymphs Abs 0.6 (*)    All other components within normal limits  CULTURE, BLOOD (ROUTINE X 2)  CULTURE, BLOOD (ROUTINE X 2)  LACTIC ACID, PLASMA  URINALYSIS, ROUTINE W REFLEX MICROSCOPIC    EKG EKG Interpretation  Date/Time:  Monday January 16 2021 08:45:41 EDT Ventricular Rate:  77 PR Interval:  142 QRS Duration: 128 QT Interval:  382 QTC Calculation: 433 R Axis:   -21 Text Interpretation: Sinus rhythm Multiple ventricular premature complexes Left bundle branch block Confirmed by Dene Gentry [id number]) on 01/16/2021 9:07:54 AM  Radiology CT ABDOMEN PELVIS W CONTRAST  Result Date: 01/14/2021 CLINICAL DATA:  Abdominal pain  and fever. EXAM: CT ABDOMEN AND PELVIS WITH CONTRAST TECHNIQUE: Multidetector CT imaging of the abdomen and pelvis was performed using the standard protocol following bolus administration of intravenous contrast. CONTRAST:   83m OMNIPAQUE IOHEXOL 350 MG/ML SOLN COMPARISON:  CT abdomen pelvis dated August 13, 2016. FINDINGS: Lower chest: No acute abnormality. Hepatobiliary: Few scattered subcentimeter low-density lesions within the liver are unchanged but remain too small to characterize. No new focal liver abnormality. Gallstones again noted. No gallbladder wall thickening or biliary dilatation. Pancreas: Unremarkable. No pancreatic ductal dilatation or surrounding inflammatory changes. Spleen: Few punctate calcified granulomas again noted. Normal in size. Adrenals/Urinary Tract: Adrenal glands and left kidney are unremarkable. Slight interval increase in size of the small simple cyst in the right kidney. No hydronephrosis. Mild circumferential bladder wall thickening, similar to prior study. Stomach/Bowel: Chronic gastric wall thickening. No obstruction. Sigmoid diverticulosis. Normal appendix. Vascular/Lymphatic: Aortic atherosclerosis. No enlarged abdominal or pelvic lymph nodes. Reproductive: Unchanged prostatomegaly with median lobe hypertrophy indenting the bladder base. Other: New trace free fluid in the pelvis.  No pneumoperitoneum. Musculoskeletal: No acute or significant osseous findings. IMPRESSION: 1. New trace free fluid in the pelvis, nonspecific. 2. Chronic gastric wall thickening, not significantly changed since 2015 and potentially in part due to underdistension. Correlate for gastritis. 3. Unchanged cholelithiasis. 4. Aortic Atherosclerosis (ICD10-I70.0). Electronically Signed   By: WTitus DubinM.D.   On: 01/14/2021 15:03   DG Chest Port 1 View  Result Date: 01/16/2021 CLINICAL DATA:  cough EXAM: PORTABLE CHEST 1 VIEW COMPARISON:  01/14/2021 FINDINGS: Sternotomy wires overlie normal cardiac silhouette. Normal pulmonary vasculature. No effusion, infiltrate, or pneumothorax. Mild linear atelectasis at the LEFT lung base. No acute osseous abnormality. IMPRESSION: Mild LEFT basilar atelectasis. Electronically  Signed   By: SSuzy BouchardM.D.   On: 01/16/2021 08:30   DG Chest Port 1 View  Result Date: 01/14/2021 CLINICAL DATA:  Cough and abdomen pain for 2 days. EXAM: PORTABLE CHEST 1 VIEW COMPARISON:  January 28, 2017 FINDINGS: The heart size and mediastinal contours are stable. Mild linear atelectasis or scar is identified in the left lung base. The lungs are otherwise clear. The lungs are hyperinflated. The visualized skeletal structures are unremarkable. IMPRESSION: No active cardiopulmonary disease. COPD. Electronically Signed   By: WAbelardo DieselM.D.   On: 01/14/2021 13:29    Procedures Procedures   Medications Ordered in ED Medications  cefTRIAXone (ROCEPHIN) 1 g in sodium chloride 0.9 % 100 mL IVPB (1 g Intravenous New Bag/Given 01/16/21 1107)    ED Course  I have reviewed the triage vital signs and the nursing notes.  Pertinent labs & imaging results that were available during my care of the patient were reviewed by me and considered in my medical decision making (see chart for details).    MDM Rules/Calculators/A&P                           MDM  MSE complete  JIZEAH BREUNINGERwas evaluated in Emergency Department on 01/16/2021 for the symptoms described in the history of present illness. He was evaluated in the context of the global COVID-19 pandemic, which necessitated consideration that the patient might be at risk for infection with the SARS-CoV-2 virus that causes COVID-19. Institutional protocols and algorithms that pertain to the evaluation of patients at risk for COVID-19 are in a state of rapid change based on information released by regulatory bodies including the CDC and federal and state organizations.  These policies and algorithms were followed during the patient's care in the ED.  Patient is presenting for evaluation.  Patient with reported positive blood culture that was obtained on the 10th.    Patient is minimally symptomatic at this time.  He is under antiviral  treatment for newly diagnosed COVID.  Symptoms described are consistent with likely COVID infection.  Case reviewed with infectious disease Lucianne Lei Dam).  Positive culture from 9/10 is most likely a contaminant.  Repeat cultures obtained.  Out of an abundance of caution, 1 dose of Rocephin administered in the ED.  Patient agrees with plan for outpatient management and use of Augmentin in the outpatient setting. He declines admission for observation.  Importance of close follow-up is stressed.  Strict return precautions given and understood.    Final Clinical Impression(s) / ED Diagnoses Final diagnoses:  COVID-19  Positive blood culture    Rx / DC Orders ED Discharge Orders          Ordered    amoxicillin-clavulanate (AUGMENTIN) 875-125 MG tablet  Every 12 hours        01/16/21 1223    ondansetron (ZOFRAN) 4 MG tablet  Every 6 hours        01/16/21 1235             Valarie Merino, MD 01/16/21 1235

## 2021-01-16 NOTE — ED Triage Notes (Signed)
Pt received phone call this morning instructing him to the ER to evaluate abnormal blood culture specimen from 9/10. Recently tested covid +.

## 2021-01-17 LAB — CULTURE, BLOOD (SINGLE): Special Requests: ADEQUATE

## 2021-01-18 ENCOUNTER — Emergency Department (HOSPITAL_BASED_OUTPATIENT_CLINIC_OR_DEPARTMENT_OTHER): Payer: Medicare Other

## 2021-01-18 ENCOUNTER — Telehealth: Payer: Self-pay | Admitting: Cardiology

## 2021-01-18 ENCOUNTER — Other Ambulatory Visit: Payer: Self-pay

## 2021-01-18 ENCOUNTER — Observation Stay (HOSPITAL_BASED_OUTPATIENT_CLINIC_OR_DEPARTMENT_OTHER)
Admission: EM | Admit: 2021-01-18 | Discharge: 2021-01-19 | Disposition: A | Discharge status: Home / Self Care | Payer: Medicare Other | Attending: Internal Medicine | Admitting: Internal Medicine

## 2021-01-18 ENCOUNTER — Encounter (HOSPITAL_BASED_OUTPATIENT_CLINIC_OR_DEPARTMENT_OTHER): Payer: Self-pay | Admitting: Emergency Medicine

## 2021-01-18 DIAGNOSIS — J45909 Unspecified asthma, uncomplicated: Secondary | ICD-10-CM | POA: Insufficient documentation

## 2021-01-18 DIAGNOSIS — I2510 Atherosclerotic heart disease of native coronary artery without angina pectoris: Secondary | ICD-10-CM | POA: Insufficient documentation

## 2021-01-18 DIAGNOSIS — I5020 Unspecified systolic (congestive) heart failure: Secondary | ICD-10-CM | POA: Diagnosis not present

## 2021-01-18 DIAGNOSIS — I214 Non-ST elevation (NSTEMI) myocardial infarction: Secondary | ICD-10-CM | POA: Diagnosis not present

## 2021-01-18 DIAGNOSIS — Z79899 Other long term (current) drug therapy: Secondary | ICD-10-CM | POA: Insufficient documentation

## 2021-01-18 DIAGNOSIS — D509 Iron deficiency anemia, unspecified: Secondary | ICD-10-CM | POA: Diagnosis present

## 2021-01-18 DIAGNOSIS — I4891 Unspecified atrial fibrillation: Secondary | ICD-10-CM | POA: Diagnosis not present

## 2021-01-18 DIAGNOSIS — N179 Acute kidney failure, unspecified: Secondary | ICD-10-CM

## 2021-01-18 DIAGNOSIS — Z7982 Long term (current) use of aspirin: Secondary | ICD-10-CM | POA: Diagnosis not present

## 2021-01-18 DIAGNOSIS — D696 Thrombocytopenia, unspecified: Secondary | ICD-10-CM | POA: Insufficient documentation

## 2021-01-18 DIAGNOSIS — K219 Gastro-esophageal reflux disease without esophagitis: Secondary | ICD-10-CM | POA: Diagnosis present

## 2021-01-18 DIAGNOSIS — D649 Anemia, unspecified: Secondary | ICD-10-CM | POA: Diagnosis not present

## 2021-01-18 DIAGNOSIS — Z951 Presence of aortocoronary bypass graft: Secondary | ICD-10-CM | POA: Insufficient documentation

## 2021-01-18 DIAGNOSIS — R0789 Other chest pain: Secondary | ICD-10-CM | POA: Diagnosis present

## 2021-01-18 DIAGNOSIS — M810 Age-related osteoporosis without current pathological fracture: Secondary | ICD-10-CM | POA: Diagnosis present

## 2021-01-18 DIAGNOSIS — I110 Hypertensive heart disease with heart failure: Secondary | ICD-10-CM | POA: Insufficient documentation

## 2021-01-18 DIAGNOSIS — Z8616 Personal history of COVID-19: Secondary | ICD-10-CM | POA: Diagnosis not present

## 2021-01-18 DIAGNOSIS — E785 Hyperlipidemia, unspecified: Secondary | ICD-10-CM | POA: Diagnosis present

## 2021-01-18 DIAGNOSIS — G4733 Obstructive sleep apnea (adult) (pediatric): Secondary | ICD-10-CM | POA: Diagnosis present

## 2021-01-18 DIAGNOSIS — U071 COVID-19: Secondary | ICD-10-CM | POA: Diagnosis not present

## 2021-01-18 DIAGNOSIS — N400 Enlarged prostate without lower urinary tract symptoms: Secondary | ICD-10-CM | POA: Diagnosis present

## 2021-01-18 DIAGNOSIS — R079 Chest pain, unspecified: Secondary | ICD-10-CM | POA: Diagnosis present

## 2021-01-18 DIAGNOSIS — J449 Chronic obstructive pulmonary disease, unspecified: Secondary | ICD-10-CM | POA: Diagnosis not present

## 2021-01-18 DIAGNOSIS — R0602 Shortness of breath: Secondary | ICD-10-CM | POA: Diagnosis not present

## 2021-01-18 DIAGNOSIS — I10 Essential (primary) hypertension: Secondary | ICD-10-CM | POA: Diagnosis present

## 2021-01-18 LAB — BASIC METABOLIC PANEL
Anion gap: 8 (ref 5–15)
BUN: 26 mg/dL — ABNORMAL HIGH (ref 8–23)
CO2: 24 mmol/L (ref 22–32)
Calcium: 8.7 mg/dL — ABNORMAL LOW (ref 8.9–10.3)
Chloride: 103 mmol/L (ref 98–111)
Creatinine, Ser: 1.4 mg/dL — ABNORMAL HIGH (ref 0.61–1.24)
GFR, Estimated: 50 mL/min — ABNORMAL LOW (ref >60)
Glucose, Bld: 128 mg/dL — ABNORMAL HIGH (ref 70–99)
Potassium: 3.5 mmol/L (ref 3.5–5.1)
Sodium: 135 mmol/L (ref 135–145)

## 2021-01-18 LAB — CBC WITH DIFFERENTIAL/PLATELET
Abs Immature Granulocytes: 0.04 10*3/uL (ref 0.00–0.07)
Basophils Absolute: 0 10*3/uL (ref 0.0–0.1)
Basophils Relative: 0 %
Eosinophils Absolute: 0 10*3/uL (ref 0.0–0.5)
Eosinophils Relative: 0 %
HCT: 35 % — ABNORMAL LOW (ref 39.0–52.0)
Hemoglobin: 11.8 g/dL — ABNORMAL LOW (ref 13.0–17.0)
Immature Granulocytes: 1 %
Lymphocytes Relative: 17 %
Lymphs Abs: 1.1 10*3/uL (ref 0.7–4.0)
MCH: 29.6 pg (ref 26.0–34.0)
MCHC: 33.7 g/dL (ref 30.0–36.0)
MCV: 87.9 fL (ref 80.0–100.0)
Monocytes Absolute: 0.5 10*3/uL (ref 0.1–1.0)
Monocytes Relative: 8 %
Neutro Abs: 4.7 10*3/uL (ref 1.7–7.7)
Neutrophils Relative %: 74 %
Platelets: 149 10*3/uL — ABNORMAL LOW (ref 150–400)
RBC: 3.98 MIL/uL — ABNORMAL LOW (ref 4.22–5.81)
RDW: 12.4 % (ref 11.5–15.5)
WBC: 6.4 10*3/uL (ref 4.0–10.5)
nRBC: 0 % (ref 0.0–0.2)

## 2021-01-18 LAB — TROPONIN I (HIGH SENSITIVITY)
Troponin I (High Sensitivity): 275 ng/L (ref <18)
Troponin I (High Sensitivity): 333 ng/L (ref <18)
Troponin I (High Sensitivity): 281 ng/L (ref <18)
Troponin I (High Sensitivity): 248 ng/L (ref <18)

## 2021-01-18 LAB — PROTIME-INR
INR: 1.1 (ref 0.8–1.2)
Prothrombin Time: 14.6 seconds (ref 11.4–15.2)

## 2021-01-18 LAB — APTT: aPTT: 33 seconds (ref 24–36)

## 2021-01-18 MED ORDER — ASPIRIN 81 MG PO CHEW
324.0000 mg | CHEWABLE_TABLET | Freq: Once | ORAL | Status: AC
  Administered 2021-01-18: 324 mg via ORAL
  Filled 2021-01-18: qty 4

## 2021-01-18 MED ORDER — HEPARIN (PORCINE) 25000 UT/250ML-% IV SOLN
750.0000 [IU]/h | INTRAVENOUS | Status: DC
  Administered 2021-01-18: 650 [IU]/h via INTRAVENOUS
  Filled 2021-01-18: qty 250

## 2021-01-18 MED ORDER — SODIUM CHLORIDE 0.9 % IV BOLUS
500.0000 mL | Freq: Once | INTRAVENOUS | Status: AC
  Administered 2021-01-18: 500 mL via INTRAVENOUS

## 2021-01-18 MED ORDER — HEPARIN BOLUS VIA INFUSION
3500.0000 [IU] | Freq: Once | INTRAVENOUS | Status: AC
  Administered 2021-01-18: 3500 [IU] via INTRAVENOUS

## 2021-01-18 NOTE — H&P (Signed)
History and Physical    JARIEN FRIESZ [id number] DOB: 1936/01/08 DOA: 01/18/2021  PCP: [name], NP Patient coming from: [facility]  Chief Complaint: chest pain  HPI: Brent Griffith is a 85 y.o. male with medical history significant of hypertension, hyperlipidemia, GERD/PUD, OSA, CAD with history of CABG, chronic anemia, asthma, BPH presented to the ED with chest pain and new onset A. fib, heart rate 97-100 and not given any rate control drugs.  Not hypoxic.  Labs showing WBC 6.4, hemoglobin 11.8 (stable), platelet count 149k.  Sodium 135, potassium 3.5, chloride 103, bicarb 24, BUN 26, creatinine 1.4 (baseline 0.9), glucose 128.  High-sensitivity troponin 275> 333>281>248.  EKG showing new onset A. fib and no acute ischemic changes.  Chest x-ray showing COPD with bibasilar atelectasis/scarring and no evidence of pneumonia.  ED physician spoke to on-call cardiologist who requested admission by hospitalist service and cardiology service will consult.  Patient was given aspirin 324 mg, 500 cc normal saline bolus, and started on IV heparin.  Of note, patient tested positive for COVID on 9/10 and improvement in symptoms on Paxlovid.  He returned to the ED on 9/12 after having a positive blood culture for Streptococcus which was felt likely to be a contaminant but he was given Rocephin and started on Augmentin. Repeat blood cx drawn 9/12 showing NGTD.   Patient states he has received 2 doses of Moderna COVID-vaccine followed by 3 booster doses.  States 2 days after his last booster, he started having COVID-like symptoms including fever and cough.  He went to the emergency room and tested positive for COVID.  States his wife also had COVID infection earlier this month.  Patient states today his heart "felt funny" and he had left-sided rib pain.  Denies history of A. fib.  Denies leg pain/swelling or history of blood clots.  Denies dizziness/lightheadedness or shortness of breath.  Reports cough related  to COVID infection which is improving.  Also reports diarrhea/loose stools since he tested positive for COVID.  Denies nausea, vomiting, or abdominal pain.  Reports poor appetite.  Review of Systems:  All systems reviewed and apart from history of presenting illness, are negative.  Past Medical History:  Diagnosis Date   Acute gastric ulcer without mention of hemorrhage, perforation, or obstruction    Allergic rhinitis, cause unspecified    Anemia, unspecified    Arthritis    Asthma    Blood transfusion without reported diagnosis    CAD (coronary artery disease)    Diaphragmatic hernia without mention of obstruction or gangrene    Elevated prostate specific antigen (PSA)    Enlarged prostate    Esophageal reflux    Extrinsic asthma, unspecified    Herpes zoster without mention of complication    Hyperlipidemia    Hypersomnia with sleep apnea, unspecified    Impotence of organic origin    Kyphosis    Lumbago    Obstructive sleep apnea (adult) (pediatric)    Pneumonia    hx of years ago    Senile osteoporosis    Trigger finger (acquired)    Tubular adenoma of colon 05/2013   Unspecified essential hypertension    Unspecified sleep apnea    CPAP- settings 4-12    Unspecified vitamin D deficiency     Past Surgical History:  Procedure Laterality Date   CARDIAC CATHETERIZATION  03/04/2009   Dr [name]   COLONOSCOPY     CORONARY ARTERY BYPASS GRAFT  2010   CYSTOSCOPY WITH RETROGRADE  PYELOGRAM, URETEROSCOPY AND STENT PLACEMENT Right 10/04/2013   Procedure: CYSTOSCOPY WITH RETROGRADE PYELOGRAM,  AND STENT PLACEMENT;  Surgeon: Festus Aloe, MD;  Location: [facility];  Service: Urology;  Laterality: Right;   DENTAL SURGERY N/A    MOLE REMOVAL  1958   chin   ROTATOR CUFF REPAIR Left 04/1998   with bone spur removed, Dr French Ana   TONSILLECTOMY  1945   TRANSURETHRAL RESECTION OF PROSTATE N/A 09/24/2013   Procedure: TRANSURETHRAL RESECTION OF THE PROSTATE (TURP) WITH GYRUS  (STAGED RIGHT LATERAL AND MEDIAN LOBE);  Surgeon: Ailene Rud, MD;  Location: [facility];  Service: Urology;  Laterality: N/A;   UPPER GI ENDOSCOPY       reports that he has never smoked. He has never used smokeless tobacco. He reports that he does not drink alcohol and does not use drugs.  Allergies  Allergen Reactions   Compazine [Prochlorperazine Edisylate] Anxiety    Family History  Problem Relation Age of Onset   Stroke Mother    Breast cancer Mother    Cirrhosis Mother        wine   Rheumatic fever Mother    Kyphosis Mother    Hypertension Father    Stroke Father    Heart disease Father    Heart disease Paternal Grandfather    Colon cancer Neg Hx     Prior to Admission medications   Medication Sig Start Date End Date Taking? Authorizing Provider  acetaminophen (TYLENOL) 500 MG tablet Take 1,000 mg by mouth every 6 (six) hours as needed for mild pain or moderate pain.    [provider]  albuterol (VENTOLIN HFA) 108 (90 Base) MCG/ACT inhaler USE 1 TO 2 INHALATIONS BY  MOUTH EVERY 6 HOURS AS  NEEDED FOR WHEEZING OR  SHORTNESS OF BREATH Patient taking differently: Inhale 1-2 puffs into the lungs every 6 (six) hours as needed for wheezing or shortness of breath. 03/03/20   [name], NP  amoxicillin-clavulanate (AUGMENTIN) 875-125 MG tablet Take 1 tablet by mouth every 12 (twelve) hours. 01/16/21   Valarie Merino, MD  aspirin EC 81 MG tablet Take 1 tablet (81 mg total) by mouth daily. 03/05/18   Lelon Perla, MD  Aspirin-Acetaminophen-Caffeine (GOODYS EXTRA STRENGTH) [phone] MG PACK Take 1 Package by mouth 2 (two) times daily as needed (pain/headache).    [provider]  Aspirin-Salicylamide-Caffeine (BC HEADACHE POWDER PO) Take 1 Package by mouth 2 (two) times daily as needed (headache/pain).    [provider]  atenolol (TENORMIN) 50 MG tablet Take 1 tablet (50 mg total) by mouth daily. Patient taking differently: Take 50 mg  by mouth every evening. 10/31/20   [name], NP  b complex vitamins tablet Take 1 tablet by mouth daily.    [provider]  Calcium Citrate-Vitamin D (EQ CALCIUM CITRATE+D3 PO) Take 1,200 mg by mouth daily.    [provider]  Cholecalciferol (VITAMIN D-3) 5000 units TABS Take 10,000 Units by mouth daily.    [provider]  Coenzyme Q10 (COQ10) 200 MG CAPS Take 200 mg by mouth daily.    [provider]  doxazosin (CARDURA) 4 MG tablet TAKE 1 TABLET BY MOUTH  DAILY TO HELP CONTROL BLOOD PRESSURE Patient taking differently: Take 4 mg by mouth daily. 10/31/20   [name], NP  famotidine (PEPCID) 20 MG tablet Take 1 tablet (20 mg total) by mouth daily. 10/31/20   [name], NP  fexofenadine (ALLEGRA) 180 MG  tablet Take 180 mg by mouth daily as needed for allergies.    [provider]  finasteride (PROSCAR) 5 MG tablet TAKE 1 TABLET BY MOUTH  EVERY MORNING Patient taking differently: Take 5 mg by mouth daily. 11/28/20   [name], NP  folic acid (FOLVITE) 1 MG tablet TAKE 1 TABLET BY MOUTH  DAILY Patient taking differently: Take 1 mg by mouth daily. 03/21/20   [name], NP  Multiple Vitamin (MULTIVITAMIN) tablet Take 1 tablet by mouth daily.    [provider]  nirmatrelvir/ritonavir EUA (PAXLOVID) 20 x 150 MG & 10 x '100MG'$  TABS Take 3 tablets by mouth 2 (two) times daily for 5 days. Patient GFR is >60. Take nirmatrelvir (150 mg) two tablets twice daily for 5 days and ritonavir (100 mg) one tablet twice daily for 5 days. 01/14/21 01/19/21  Curatolo, Adam, DO  ondansetron (ZOFRAN) 4 MG tablet Take 1 tablet (4 mg total) by mouth every 6 (six) hours. 01/16/21   Valarie Merino, MD  pantoprazole (PROTONIX) 40 MG tablet Take 1 tablet (40 mg total) by mouth daily. 10/31/20   [name], NP  rosuvastatin (CRESTOR) 20 MG tablet TAKE 1 TABLET BY MOUTH ONCE DAILY FOR CHOLESTEROL Patient taking differently: Take  20 mg by mouth every evening. 03/21/20   [name], NP    Physical Exam: Vitals:   01/18/21 1848 01/18/21 2000 01/18/21 2100 01/18/21 2200  BP: 134/73 124/71    Pulse: 86 90 86 87  Resp: '18 11 12 '$ (!) 21  Temp: 98.1 F (36.7 C) 98.2 F (36.8 C)    TempSrc: Oral Oral    SpO2: 100% 100% 100% 100%  Weight:      Height:        Physical Exam Constitutional:      General: He is not in acute distress. HENT:     Head: Normocephalic and atraumatic.  Eyes:     Extraocular Movements: Extraocular movements intact.     Conjunctiva/sclera: Conjunctivae normal.  Cardiovascular:     Rate and Rhythm: Normal rate. Rhythm irregular.     Pulses: Normal pulses.  Pulmonary:     Effort: Pulmonary effort is normal. No respiratory distress.     Breath sounds: Normal breath sounds. No wheezing or rales.  Abdominal:     General: Bowel sounds are normal. There is no distension.     Palpations: Abdomen is soft.     Tenderness: There is no abdominal tenderness. There is no guarding or rebound.  Musculoskeletal:        General: No swelling or tenderness.     Cervical back: Normal range of motion and neck supple.  Skin:    General: Skin is warm and dry.  Neurological:     General: No focal deficit present.     Mental Status: He is alert and oriented to person, place, and time.     Labs on Admission: I have personally reviewed following labs and imaging studies  CBC: Recent Labs  Lab 01/14/21 1256 01/16/21 0847 01/18/21 1310  WBC 4.7 6.2 6.4  NEUTROABS 3.5 4.9 4.7  HGB 11.3* 11.5* 11.8*  HCT 34.6* 34.8* 35.0*  MCV 90.6 90.6 87.9  PLT 134* 131* [id number]*   Basic Metabolic Panel: Recent Labs  Lab 01/14/21 1256 01/16/21 0847 01/18/21 1310  NA 138 135 135  K 4.2 3.5 3.5  CL 106 104 103  CO2 25 20* 24  GLUCOSE 108* 117* 128*  BUN 23 23  26*  CREATININE 1.15 0.95 1.40*  CALCIUM 8.8* 8.7* 8.7*   GFR: Estimated Creatinine Clearance: 32.1 mL/min (A) (by C-G formula based on SCr  of 1.4 mg/dL (H)). Liver Function Tests: Recent Labs  Lab 01/14/21 1256 01/16/21 0847  AST 26 55*  ALT 20 34  ALKPHOS 44 45  BILITOT 1.6* 1.7*  PROT 6.8 6.7  ALBUMIN 3.7 3.7   No results for input(s): LIPASE, AMYLASE in the last 168 hours. No results for input(s): AMMONIA in the last 168 hours. Coagulation Profile: Recent Labs  Lab 01/18/21 1310  INR 1.1   Cardiac Enzymes: No results for input(s): CKTOTAL, CKMB, CKMBINDEX, TROPONINI in the last 168 hours. BNP (last 3 results) No results for input(s): PROBNP in the last 8760 hours. HbA1C: No results for input(s): HGBA1C in the last 72 hours. CBG: No results for input(s): GLUCAP in the last 168 hours. Lipid Profile: No results for input(s): CHOL, HDL, LDLCALC, TRIG, CHOLHDL, LDLDIRECT in the last 72 hours. Thyroid Function Tests: No results for input(s): TSH, T4TOTAL, FREET4, T3FREE, THYROIDAB in the last 72 hours. Anemia Panel: No results for input(s): VITAMINB12, FOLATE, FERRITIN, TIBC, IRON, RETICCTPCT in the last 72 hours. Urine analysis:    Component Value Date/Time   COLORURINE AMBER (A) 10/03/2013 1615   APPEARANCEUR TURBID (A) 10/03/2013 1615   LABSPEC 1.021 10/03/2013 1615   PHURINE 6.0 10/03/2013 1615   GLUCOSEU NEGATIVE 10/03/2013 1615   HGBUR LARGE (A) 10/03/2013 1615   BILIRUBINUR NEGATIVE 10/03/2013 1615   KETONESUR NEGATIVE 10/03/2013 1615   PROTEINUR >300 (A) 10/03/2013 1615   UROBILINOGEN 0.2 10/03/2013 1615   NITRITE NEGATIVE 10/03/2013 1615   LEUKOCYTESUR MODERATE (A) 10/03/2013 1615    Radiological Exams on Admission: DG Chest Port 1 View  Result Date: 01/18/2021 CLINICAL DATA:  Short of breath.  COVID positive EXAM: PORTABLE CHEST 1 VIEW COMPARISON:  01/16/2021 FINDINGS: Prior CABG without heart failure. COPD with hyperinflation lungs. Mild atelectasis or scarring in the bases. Negative for pneumonia or effusion. IMPRESSION: COPD with bibasilar atelectasis/scarring.  Negative for pneumonia.  Electronically Signed   By: Franchot Gallo M.D.   On: 01/18/2021 13:40    EKG: Independently reviewed.  New onset A. fib, PVCs, LBBB similar to prior tracing.  Assessment/Plan Principal Problem:   New onset atrial fibrillation (HCC) Active Problems:   Essential hypertension   G E R D   Chest pain   AKI (acute kidney injury) ([place])   New onset A. Fib -Heart rate currently in the 80s to 90s -Likely precipitating factor is covid infection. PE less likely given no hypoxia.  Check D-dimer level. -Check TSH level -Echocardiogram ordered -CHA2DS2Vasc 4. Continue IV heparin. -I have messaged cardiology requesting consultation in the morning.   Chest pain, elevated troponin History of CAD status post CABG -HS troponin peaked at 333 and trending down -EKG without acute ischemic changes -Continue to trend troponin -Full dose aspirin administered in the ED -Continue IV heparin -Cardiac monitoring -Cardiology consulted -Currently chest pain-free  AKI -BUN 26, creatinine 1.4 (baseline 0.9) -Possibly pre-renal from dehydration -Continue gentle IV fluid hydration -Repeat BMP in am -Avoid nephrotoxic agents  COVID infection -Tested positive on 01/14/2021 and started on Paxlovid with improvement of symptoms. -Resume Paxlovid to finish 5 day course after pharmacy med rec is done -Not hypoxic -CXR not suggestive of PNA -Continue airborne and contact precautions -Check ferritin, fibrinogen, D-dimer, and CRP levels  Recent positive blood cx -Blood cx positive for streptococcus on 9/10 which was felt  likely to be a contaminant but he was given Rocephin and started on Augmentin. -Repeat blood cx drawn 9/12 showing NGTD.  -No fever or leukocytosis -Check PCT level   Chronic anemia -Stable, Hgb at baseline  Hypertension: Stable Hyperlipidemia GERD/PUD Asthma: Stable, no wheezing.  BPH -Pharmacy med rec pending   DVT prophylaxis: Heparin  Code Status: Patient wishes to be full  code. Family Communication: No family available at this time. Disposition Plan: Status is: Observation  The patient remains OBS appropriate and will d/c before 2 midnights.  Dispo: The patient is from: Home              Anticipated d/c is to: Home              Patient currently is not medically stable to d/c.   Difficult to place patient No  Level of care: Level of care: Telemetry Cardiac  The medical decision making on this patient was of high complexity and the patient is at high risk for clinical deterioration, therefore this is a level 3 visit.  Shela Leff MD [facility]  If 7PM-7AM, please contact night-coverage [email or web address]  01/19/2021, 12:26 AM

## 2021-01-18 NOTE — Progress Notes (Signed)
Received a phone call from Facility: [facility]  Requesting MD: Dr. Tamera Punt Patient with h/o HTN, HLD, GERD, OSA, CAD with hx of CABG presenting with chest pain and new onset afib. +covid on 9/10 and improvement in symptoms on paxlovid. Woke up this morning with diaphoresis, sweating and chest pain. Ekg shows new onset Afib. HR: 97-100, not given any rate control drugs.  Troponin up to 75, delta pending. Spoke with cardiology who would like [place] to admit since covid positive and they will see. Heart rate: 97-100.  Vitals stable. 100% on RA.  Plan of care: admit to tele, call cardiology when patient arrives.  The patient will be accepted for admission to telemetry at [facility] when bed is available.    Nursing staff, Please call the [place] number at the top of Amion at the time of the patient's arrival so that the patient can be paged to the admitting physician.   Casimer Bilis, M.D. [facility]

## 2021-01-18 NOTE — Progress Notes (Signed)
Pt admitted to Unit from [place] A/ox4 ,Covid (+). CHG given. Will continue to monitor . Heparin going at 6.5ML per Hour. Skin assessment complete.    Domingo Mend, RN     01/18/21 1848  Vitals  Temp 98.1 F (36.7 C)  Temp Source Oral  BP 134/73  MAP (mmHg) 91  BP Location Right Arm  BP Method Automatic  Patient Position (if appropriate) Lying  Pulse Rate 86  Pulse Rate Source Monitor  ECG Heart Rate 91  Resp 18  Level of Consciousness  Level of Consciousness Alert  MEWS COLOR  MEWS Score Color Green  Oxygen Therapy  SpO2 100 %  O2 Device Room Air  O2 Flow Rate (L/min) 0 L/min  Pain Assessment  Pain Scale 0-10  Pain Score 0  ECG Monitoring  Cardiac Rhythm Atrial fibrillation  MEWS Score  MEWS Temp 0  MEWS Systolic 0  MEWS Pulse 0  MEWS RR 0  MEWS LOC 0  MEWS Score 0

## 2021-01-18 NOTE — Telephone Encounter (Signed)
Left message to call back  

## 2021-01-18 NOTE — ED Provider Notes (Signed)
[facility] EMERGENCY DEPARTMENT Provider Note   CSN: [id number] Arrival date & time: 01/18/21  1150     History Chief Complaint  Patient presents with   Covid Positive    Brent Griffith is a 85 y.o. male.  Patient is a 85 year old male with a history of coronary artery disease, asthma, hyperlipidemia, hypertension who presents with an episode of chest pain this morning.  He was diagnosed with COVID on September 10.  He was started on Paxil COVID.  He returned on September 12 to the emergency department after having a positive blood culture for Streptococcus.  It was deemed likely to be a contaminant but he was given Rocephin and started on Augmentin which he is still taking.  He says overall he is feeling slowly better.  However early this morning he became clammy.  He had an episode of discomfort to his left chest that lasted about 30 minutes starting 8:00 this morning.  He has not had any further episodes.  He reports some intermittent shortness of breath.  His coughing is ongoing but seems to be a little bit better.  He does not have any pleuritic type symptoms.  No ongoing fevers.  No vomiting but he does have some loose stools.      Past Medical History:  Diagnosis Date   Acute gastric ulcer without mention of hemorrhage, perforation, or obstruction    Allergic rhinitis, cause unspecified    Anemia, unspecified    Arthritis    Asthma    Blood transfusion without reported diagnosis    CAD (coronary artery disease)    Diaphragmatic hernia without mention of obstruction or gangrene    Elevated prostate specific antigen (PSA)    Enlarged prostate    Esophageal reflux    Extrinsic asthma, unspecified    Herpes zoster without mention of complication    Hyperlipidemia    Hypersomnia with sleep apnea, unspecified    Impotence of organic origin    Kyphosis    Lumbago    Obstructive sleep apnea (adult) (pediatric)    Pneumonia    hx of years ago    Senile osteoporosis     Trigger finger (acquired)    Tubular adenoma of colon 05/2013   Unspecified essential hypertension    Unspecified sleep apnea    CPAP- settings 4-12    Unspecified vitamin D deficiency     Patient Active Problem List   Diagnosis Date Noted   Chest pain 01/18/2021   CAD (coronary artery disease) 10/11/2017   IDA (iron deficiency anemia) 08/31/2016   Seasonal and perennial allergic rhinitis 08/28/2016   Palpitations 09/07/2015   Cough, persistent 07/20/2015   Senile osteoporosis 06/09/2014   Shortness of breath 03/20/2014   Hydronephrosis of right kidney 10/03/2013   Benign prostatic hyperplasia 09/24/2013   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   Dyslipidemia 04/06/2009   CARDIOVASCULAR FUNCTION STUDY, ABNORMAL 03/03/2009   Obstructive sleep apnea 02/04/2009   Essential hypertension 09/02/2008   G E R D 09/02/2008   PERIODIC LIMB MOVEMENT DISORDER 09/03/2007    Past Surgical History:  Procedure Laterality Date   CARDIAC CATHETERIZATION  03/04/2009   Dr [name]   COLONOSCOPY     CORONARY ARTERY BYPASS GRAFT  2010   CYSTOSCOPY WITH RETROGRADE PYELOGRAM, URETEROSCOPY AND STENT PLACEMENT Right 10/04/2013   Procedure: CYSTOSCOPY WITH RETROGRADE PYELOGRAM,  AND STENT PLACEMENT;  Surgeon: Festus Aloe, MD;  Location: [facility];  Service: Urology;  Laterality: Right;   DENTAL SURGERY N/A  MOLE REMOVAL  1958   chin   ROTATOR CUFF REPAIR Left 04/1998   with bone spur removed, Dr French Ana   TONSILLECTOMY  1945   TRANSURETHRAL RESECTION OF PROSTATE N/A 09/24/2013   Procedure: TRANSURETHRAL RESECTION OF THE PROSTATE (TURP) WITH GYRUS (STAGED RIGHT LATERAL AND MEDIAN LOBE);  Surgeon: Ailene Rud, MD;  Location: [facility];  Service: Urology;  Laterality: N/A;   UPPER GI ENDOSCOPY         Family History  Problem Relation Age of Onset   Stroke Mother    Breast cancer Mother    Cirrhosis Mother        wine   Rheumatic fever Mother    Kyphosis Mother    Hypertension Father     Stroke Father    Heart disease Father    Heart disease Paternal Grandfather    Colon cancer Neg Hx     Social History   Tobacco Use   Smoking status: Never   Smokeless tobacco: Never  Vaping Use   Vaping Use: Never used  Substance Use Topics   Alcohol use: No   Drug use: No    Home Medications Prior to Admission medications   Medication Sig Start Date End Date Taking? Authorizing Provider  acetaminophen (TYLENOL) 500 MG tablet Take 1,000 mg by mouth every 6 (six) hours as needed for mild pain or moderate pain.    [provider]  albuterol (VENTOLIN HFA) 108 (90 Base) MCG/ACT inhaler USE 1 TO 2 INHALATIONS BY  MOUTH EVERY 6 HOURS AS  NEEDED FOR WHEEZING OR  SHORTNESS OF BREATH Patient taking differently: Inhale 1-2 puffs into the lungs every 6 (six) hours as needed for wheezing or shortness of breath. 03/03/20   [name], NP  amoxicillin-clavulanate (AUGMENTIN) 875-125 MG tablet Take 1 tablet by mouth every 12 (twelve) hours. 01/16/21   Valarie Merino, MD  aspirin EC 81 MG tablet Take 1 tablet (81 mg total) by mouth daily. 03/05/18   Lelon Perla, MD  Aspirin-Acetaminophen-Caffeine (GOODYS EXTRA STRENGTH) [phone] MG PACK Take 1 Package by mouth 2 (two) times daily as needed (pain/headache).    [provider]  Aspirin-Salicylamide-Caffeine (BC HEADACHE POWDER PO) Take 1 Package by mouth 2 (two) times daily as needed (headache/pain).    [provider]  atenolol (TENORMIN) 50 MG tablet Take 1 tablet (50 mg total) by mouth daily. Patient taking differently: Take 50 mg by mouth every evening. 10/31/20   [name], NP  b complex vitamins tablet Take 1 tablet by mouth daily.    [provider]  Calcium Citrate-Vitamin D (EQ CALCIUM CITRATE+D3 PO) Take 1,200 mg by mouth daily.    [provider]  Cholecalciferol (VITAMIN D-3) 5000 units TABS Take 10,000 Units by mouth daily.    [provider]  Coenzyme Q10  (COQ10) 200 MG CAPS Take 200 mg by mouth daily.    [provider]  doxazosin (CARDURA) 4 MG tablet TAKE 1 TABLET BY MOUTH  DAILY TO HELP CONTROL BLOOD PRESSURE Patient taking differently: Take 4 mg by mouth daily. 10/31/20   [name], NP  famotidine (PEPCID) 20 MG tablet Take 1 tablet (20 mg total) by mouth daily. 10/31/20   [name], NP  fexofenadine (ALLEGRA) 180 MG tablet Take 180 mg by mouth daily as needed for allergies.    [provider]  finasteride (PROSCAR) 5 MG tablet TAKE 1 TABLET BY MOUTH  EVERY MORNING Patient taking differently:  Take 5 mg by mouth daily. 11/28/20   [name], NP  folic acid (FOLVITE) 1 MG tablet TAKE 1 TABLET BY MOUTH  DAILY Patient taking differently: Take 1 mg by mouth daily. 03/21/20   [name], NP  Multiple Vitamin (MULTIVITAMIN) tablet Take 1 tablet by mouth daily.    [provider]  nirmatrelvir/ritonavir EUA (PAXLOVID) 20 x 150 MG & 10 x '100MG'$  TABS Take 3 tablets by mouth 2 (two) times daily for 5 days. Patient GFR is >60. Take nirmatrelvir (150 mg) two tablets twice daily for 5 days and ritonavir (100 mg) one tablet twice daily for 5 days. 01/14/21 01/19/21  Curatolo, Adam, DO  ondansetron (ZOFRAN) 4 MG tablet Take 1 tablet (4 mg total) by mouth every 6 (six) hours. 01/16/21   Valarie Merino, MD  pantoprazole (PROTONIX) 40 MG tablet Take 1 tablet (40 mg total) by mouth daily. 10/31/20   [name], NP  rosuvastatin (CRESTOR) 20 MG tablet TAKE 1 TABLET BY MOUTH ONCE DAILY FOR CHOLESTEROL Patient taking differently: Take 20 mg by mouth every evening. 03/21/20   [name], NP    Allergies    Compazine [prochlorperazine edisylate]  Review of Systems   Review of Systems  Constitutional:  Negative for chills, diaphoresis, fatigue and fever.  HENT:  Negative for congestion, rhinorrhea and sneezing.   Eyes: Negative.   Respiratory:  Positive for shortness of breath. Negative for  cough and chest tightness.   Cardiovascular:  Positive for chest pain. Negative for leg swelling.  Gastrointestinal:  Negative for abdominal pain, blood in stool, diarrhea, nausea and vomiting.  Genitourinary:  Negative for difficulty urinating, flank pain, frequency and hematuria.  Musculoskeletal:  Negative for arthralgias and back pain.  Skin:  Negative for rash.  Neurological:  Negative for dizziness, speech difficulty, weakness, numbness and headaches.   Physical Exam Updated Vital Signs BP 117/77   Pulse 92   Temp 98.6 F (37 C)   Resp (!) 21   Ht 5' 3.5" (1.613 m)   Wt 56.7 kg   SpO2 99%   BMI 21.80 kg/m   Physical Exam Constitutional:      Appearance: He is well-developed.  HENT:     Head: Normocephalic and atraumatic.  Eyes:     Pupils: Pupils are equal, round, and reactive to light.  Cardiovascular:     Rate and Rhythm: Regular rhythm. Tachycardia present.     Heart sounds: Normal heart sounds.  Pulmonary:     Effort: Pulmonary effort is normal. No respiratory distress.     Breath sounds: Normal breath sounds. No wheezing or rales.  Chest:     Chest wall: No tenderness.  Abdominal:     General: Bowel sounds are normal.     Palpations: Abdomen is soft.     Tenderness: There is no abdominal tenderness. There is no guarding or rebound.  Musculoskeletal:        General: Normal range of motion.     Cervical back: Normal range of motion and neck supple.     Comments: No edema or calf tenderness  Lymphadenopathy:     Cervical: No cervical adenopathy.  Skin:    General: Skin is warm and dry.     Findings: No rash.  Neurological:     Mental Status: He is alert and oriented to person, place, and time.    ED Results / Procedures / Treatments   Labs (all labs ordered are listed, but only  abnormal results are displayed) Labs Reviewed  BASIC METABOLIC PANEL - Abnormal; Notable for the following components:      Result Value   Glucose, Bld 128 (*)    BUN 26 (*)     Creatinine, Ser 1.40 (*)    Calcium 8.7 (*)    GFR, Estimated 50 (*)    All other components within normal limits  CBC WITH DIFFERENTIAL/PLATELET - Abnormal; Notable for the following components:   RBC 3.98 (*)    Hemoglobin 11.8 (*)    HCT 35.0 (*)    Platelets 149 (*)    All other components within normal limits  TROPONIN I (HIGH SENSITIVITY) - Abnormal; Notable for the following components:   Troponin I (High Sensitivity) 275 (*)    All other components within normal limits  TROPONIN I (HIGH SENSITIVITY)    EKG EKG Interpretation  Date/Time:  Wednesday January 18 2021 15:00:23 EDT Ventricular Rate:  96 PR Interval:    QRS Duration: 115 QT Interval:  362 QTC Calculation: 458 R Axis:   -8 Text Interpretation: Atrial fibrillation Ventricular premature complex Incomplete left bundle branch block LVH with secondary repolarization abnormality Anterior ST elevation, probably due to LVH since last tracing no significant change Confirmed by Malvin Johns [phone]) on 01/18/2021 3:08:57 PM  Radiology DG Chest Port 1 View  Result Date: 01/18/2021 CLINICAL DATA:  Short of breath.  COVID positive EXAM: PORTABLE CHEST 1 VIEW COMPARISON:  01/16/2021 FINDINGS: Prior CABG without heart failure. COPD with hyperinflation lungs. Mild atelectasis or scarring in the bases. Negative for pneumonia or effusion. IMPRESSION: COPD with bibasilar atelectasis/scarring.  Negative for pneumonia. Electronically Signed   By: Franchot Gallo M.D.   On: 01/18/2021 13:40    Procedures Procedures   Medications Ordered in ED Medications  sodium chloride 0.9 % bolus 500 mL (0 mLs Intravenous Stopped 01/18/21 1455)    ED Course  I have reviewed the triage vital signs and the nursing notes.  Pertinent labs & imaging results that were available during my care of the patient were reviewed by me and considered in my medical decision making (see chart for details).    MDM Rules/Calculators/A&P                            Patient is a 85 year old male who presents with an episode of chest pain that happened this morning.  He has had no further episodes of chest pain.  He has had a little bit of shortness of breath but no pleuritic symptoms, hypoxia or other symptoms to suggest PE.  His chest x-ray is clear without evidence of pneumonia or pulmonary edema.  His EKG shows new onset A. fib without ischemic changes.  There is a left bundle branch block there that was seen previously.  His first troponin is elevated at 275.  Second troponin is pending.  His creatinine is mildly elevated.  He was gently hydrated.  His other labs are nonconcerning.  I spoke with Dr. Gwenlyn Found with cardiology who states that they will follow along the patient but request a hospitalist admission.  I spoke with Dr. Rogers Blocker with the hospitalist service he will admit the patient for further treatment. Final Clinical Impression(s) / ED Diagnoses Final diagnoses:  NSTEMI (non-ST elevated myocardial infarction) ([place])  New onset atrial fibrillation ([place])    Rx / DC Orders ED Discharge Orders     None        Malvin Johns,  MD 01/18/21 [id number]

## 2021-01-18 NOTE — Telephone Encounter (Signed)
Pt is calling to get advice on whether he should go to [place], he is currently experiencing Left side pain under arm/ sweaty/diahrea since Saturday/missed 2 dosages of Atenolol and pt also tested positive for Covid on Saturday Pt went to [facility] Monday with bacterial infection in his blood, he was given an antibiotic and nausea medicine but he have not used it because he says he doesn't need it. Pt states if it okay with Dr. Stanford Breed, he will go ahead and head on over to [place] for evaluation. Please advise pt further

## 2021-01-18 NOTE — ED Triage Notes (Signed)
Reports sweating since being diagnosed with covid on Saturday and reports left sided chest pain that started this morning.  Patient did not mention CP to registration.  Was recently at [facility] due to positive blood culture.

## 2021-01-18 NOTE — Progress Notes (Addendum)
ANTICOAGULATION CONSULT NOTE - Initial Consult  Pharmacy Consult for heparin Indication: chest pain/ACS  Allergies  Allergen Reactions   Compazine [Prochlorperazine Edisylate] Anxiety    Patient Measurements: Height: 5' 3.5" (161.3 cm) Weight: 56.7 kg (125 lb) IBW/kg (Calculated) : 58.05 Heparin Dosing Weight: 57 kg  Vital Signs: Temp: 98.6 F (37 C) (09/14 1235) Temp Source: Oral (09/14 1219) BP: 117/77 (09/14 1431) Pulse Rate: 92 (09/14 1431)  Labs: Recent Labs    01/16/21 0847 01/18/21 1310  HGB 11.5* 11.8*  HCT 34.8* 35.0*  PLT 131* 149*  CREATININE 0.95 1.40*  TROPONINIHS  --  275*    Estimated Creatinine Clearance: 32.1 mL/min (A) (by C-G formula based on SCr of 1.4 mg/dL (H)).   Medical History: Past Medical History:  Diagnosis Date   Acute gastric ulcer without mention of hemorrhage, perforation, or obstruction    Allergic rhinitis, cause unspecified    Anemia, unspecified    Arthritis    Asthma    Blood transfusion without reported diagnosis    CAD (coronary artery disease)    Diaphragmatic hernia without mention of obstruction or gangrene    Elevated prostate specific antigen (PSA)    Enlarged prostate    Esophageal reflux    Extrinsic asthma, unspecified    Herpes zoster without mention of complication    Hyperlipidemia    Hypersomnia with sleep apnea, unspecified    Impotence of organic origin    Kyphosis    Lumbago    Obstructive sleep apnea (adult) (pediatric)    Pneumonia    hx of years ago    Senile osteoporosis    Trigger finger (acquired)    Tubular adenoma of colon 05/2013   Unspecified essential hypertension    Unspecified sleep apnea    CPAP- settings 4-12    Unspecified vitamin D deficiency     Medications:  Infusions:   heparin      Assessment: 85 yo male presented with chest pain in setting of atrial fibrillation, elevated troponin (Tmax 275). Patient not on anticoagulation PTA. Pharmacy consulted to dose heparin for  ACS.  CBC stable - Hgb slightly below baseline at 11.8 (BL~12-13), platelets low at 149 (BL ~160-80s).  Goal of Therapy:  Heparin level 0.3-0.7 units/ml Monitor platelets by anticoagulation protocol: Yes   Plan:  Give heparin bolus 3500 units x1 Start heparin infusion 650 units/hr 8 hour heparin level Daily CBC, heparin level Monitor for s/sx of bleeding  Laurey Arrow, PharmD PGY1 Pharmacy Resident 01/18/2021  3:50 PM  Please check [email or web address] for unit-specific pharmacy phone numbers.

## 2021-01-19 ENCOUNTER — Observation Stay (HOSPITAL_BASED_OUTPATIENT_CLINIC_OR_DEPARTMENT_OTHER): Payer: Medicare Other

## 2021-01-19 DIAGNOSIS — D508 Other iron deficiency anemias: Secondary | ICD-10-CM | POA: Diagnosis not present

## 2021-01-19 DIAGNOSIS — G4733 Obstructive sleep apnea (adult) (pediatric): Secondary | ICD-10-CM | POA: Diagnosis not present

## 2021-01-19 DIAGNOSIS — M810 Age-related osteoporosis without current pathological fracture: Secondary | ICD-10-CM

## 2021-01-19 DIAGNOSIS — N179 Acute kidney failure, unspecified: Secondary | ICD-10-CM | POA: Diagnosis not present

## 2021-01-19 DIAGNOSIS — E785 Hyperlipidemia, unspecified: Secondary | ICD-10-CM | POA: Diagnosis not present

## 2021-01-19 DIAGNOSIS — I10 Essential (primary) hypertension: Secondary | ICD-10-CM | POA: Diagnosis not present

## 2021-01-19 DIAGNOSIS — I5020 Unspecified systolic (congestive) heart failure: Secondary | ICD-10-CM | POA: Diagnosis not present

## 2021-01-19 DIAGNOSIS — N401 Enlarged prostate with lower urinary tract symptoms: Secondary | ICD-10-CM

## 2021-01-19 DIAGNOSIS — I4891 Unspecified atrial fibrillation: Secondary | ICD-10-CM | POA: Diagnosis not present

## 2021-01-19 LAB — LACTATE DEHYDROGENASE: LDH: 121 U/L (ref 98–192)

## 2021-01-19 LAB — BASIC METABOLIC PANEL
Anion gap: 7 (ref 5–15)
BUN: 22 mg/dL (ref 8–23)
CO2: 26 mmol/L (ref 22–32)
Calcium: 8.4 mg/dL — ABNORMAL LOW (ref 8.9–10.3)
Chloride: 105 mmol/L (ref 98–111)
Creatinine, Ser: 1.15 mg/dL (ref 0.61–1.24)
GFR, Estimated: >60 mL/min (ref >60)
Glucose, Bld: 112 mg/dL — ABNORMAL HIGH (ref 70–99)
Potassium: 3.4 mmol/L — ABNORMAL LOW (ref 3.5–5.1)
Sodium: 138 mmol/L (ref 135–145)

## 2021-01-19 LAB — CBC
HCT: 34 % — ABNORMAL LOW (ref 39.0–52.0)
Hemoglobin: 11.6 g/dL — ABNORMAL LOW (ref 13.0–17.0)
MCH: 30.4 pg (ref 26.0–34.0)
MCHC: 34.1 g/dL (ref 30.0–36.0)
MCV: 89.2 fL (ref 80.0–100.0)
Platelets: 147 10*3/uL — ABNORMAL LOW (ref 150–400)
RBC: 3.81 MIL/uL — ABNORMAL LOW (ref 4.22–5.81)
RDW: 12.4 % (ref 11.5–15.5)
WBC: 6 10*3/uL (ref 4.0–10.5)
nRBC: 0 % (ref 0.0–0.2)

## 2021-01-19 LAB — D-DIMER, QUANTITATIVE: D-Dimer, Quant: 1.23 ug/mL-FEU — ABNORMAL HIGH (ref 0.00–0.50)

## 2021-01-19 LAB — TROPONIN I (HIGH SENSITIVITY): Troponin I (High Sensitivity): 167 ng/L

## 2021-01-19 LAB — ECHOCARDIOGRAM LIMITED
Area-P 1/2: 4.46 cm2
Height: 63.5 in
P 1/2 time: 699 msec
S' Lateral: 3.3 cm
Weight: 2000 oz

## 2021-01-19 LAB — MAGNESIUM: Magnesium: 1.6 mg/dL — ABNORMAL LOW (ref 1.7–2.4)

## 2021-01-19 LAB — HEPARIN LEVEL (UNFRACTIONATED)
Heparin Unfractionated: 0.23 [IU]/mL — ABNORMAL LOW (ref 0.30–0.70)
Heparin Unfractionated: 0.31 IU/mL (ref 0.30–0.70)

## 2021-01-19 LAB — TSH: TSH: 1.253 u[IU]/mL (ref 0.350–4.500)

## 2021-01-19 LAB — FIBRINOGEN: Fibrinogen: 452 mg/dL (ref 210–475)

## 2021-01-19 LAB — C-REACTIVE PROTEIN: CRP: 2.8 mg/dL — ABNORMAL HIGH (ref <1.0)

## 2021-01-19 LAB — PROCALCITONIN: Procalcitonin: <0.1 ng/mL

## 2021-01-19 LAB — FERRITIN: Ferritin: 101 ng/mL (ref 24–336)

## 2021-01-19 MED ORDER — SODIUM CHLORIDE 0.9 % IV SOLN: INTRAVENOUS | Status: AC

## 2021-01-19 MED ORDER — LISINOPRIL 5 MG PO TABS: 5.0000 mg | ORAL_TABLET | Freq: Every day | ORAL | 0 refills | Status: DC

## 2021-01-19 MED ORDER — AMIODARONE HCL 200 MG PO TABS
400.0000 mg | ORAL_TABLET | Freq: Two times a day (BID) | ORAL | Status: DC
  Administered 2021-01-19: 400 mg via ORAL
  Filled 2021-01-19: qty 2

## 2021-01-19 MED ORDER — LISINOPRIL 5 MG PO TABS
5.0000 mg | ORAL_TABLET | Freq: Every day | ORAL | Status: DC
  Administered 2021-01-19: 5 mg via ORAL
  Filled 2021-01-19: qty 1

## 2021-01-19 MED ORDER — APIXABAN 2.5 MG PO TABS
2.5000 mg | ORAL_TABLET | Freq: Two times a day (BID) | ORAL | Status: DC
  Administered 2021-01-19: 2.5 mg via ORAL
  Filled 2021-01-19: qty 1

## 2021-01-19 MED ORDER — ACETAMINOPHEN 325 MG PO TABS: 650.0000 mg | ORAL_TABLET | Freq: Four times a day (QID) | ORAL | Status: DC | PRN

## 2021-01-19 MED ORDER — ACETAMINOPHEN 650 MG RE SUPP: 650.0000 mg | Freq: Four times a day (QID) | RECTAL | Status: DC | PRN

## 2021-01-19 MED ORDER — CARVEDILOL 3.125 MG PO TABS
3.1250 mg | ORAL_TABLET | Freq: Two times a day (BID) | ORAL | Status: DC
  Administered 2021-01-19: 3.125 mg via ORAL
  Filled 2021-01-19: qty 1

## 2021-01-19 MED ORDER — APIXABAN 2.5 MG PO TABS: 2.5000 mg | ORAL_TABLET | Freq: Two times a day (BID) | ORAL | 0 refills | Status: DC

## 2021-01-19 MED ORDER — POTASSIUM CHLORIDE CRYS ER 20 MEQ PO TBCR
40.0000 meq | EXTENDED_RELEASE_TABLET | Freq: Two times a day (BID) | ORAL | Status: DC
  Administered 2021-01-19: 40 meq via ORAL
  Filled 2021-01-19: qty 2

## 2021-01-19 MED ORDER — AMIODARONE HCL 200 MG PO TABS: 200.0000 mg | ORAL_TABLET | Freq: Every day | ORAL | Status: DC

## 2021-01-19 MED ORDER — HEPARIN (PORCINE) 25000 UT/250ML-% IV SOLN: 850.0000 [IU]/h | INTRAVENOUS | Status: DC

## 2021-01-19 MED ORDER — ASPIRIN EC 81 MG PO TBEC
81.0000 mg | DELAYED_RELEASE_TABLET | Freq: Every day | ORAL | Status: DC
  Administered 2021-01-19: 81 mg via ORAL
  Filled 2021-01-19: qty 1

## 2021-01-19 MED ORDER — ROSUVASTATIN CALCIUM 20 MG PO TABS
20.0000 mg | ORAL_TABLET | Freq: Every day | ORAL | Status: DC
  Administered 2021-01-19: 20 mg via ORAL
  Filled 2021-01-19: qty 1

## 2021-01-19 MED ORDER — AMIODARONE HCL 400 MG PO TABS: 400.0000 mg | ORAL_TABLET | Freq: Two times a day (BID) | ORAL | 0 refills | Status: DC

## 2021-01-19 MED ORDER — HEPARIN BOLUS VIA INFUSION
1000.0000 [IU] | Freq: Once | INTRAVENOUS | Status: AC
  Administered 2021-01-19: 1000 [IU] via INTRAVENOUS
  Filled 2021-01-19: qty 1000

## 2021-01-19 MED ORDER — AMIODARONE HCL 200 MG PO TABS: 200.0000 mg | ORAL_TABLET | Freq: Every day | ORAL | 0 refills | Status: DC

## 2021-01-19 MED ORDER — CARVEDILOL 3.125 MG PO TABS
3.1250 mg | ORAL_TABLET | Freq: Two times a day (BID) | ORAL | 0 refills | Status: DC
Start: 2021-01-19 — End: 2021-02-13

## 2021-01-19 NOTE — Plan of Care (Signed)
  Problem: Clinical Measurements: Goal: Ability to maintain clinical measurements within normal limits will improve Outcome: Adequate for Discharge Goal: Will remain free from infection Outcome: Adequate for Discharge Goal: Diagnostic test results will improve Outcome: Adequate for Discharge Goal: Respiratory complications will improve Outcome: Adequate for Discharge Goal: Cardiovascular complication will be avoided Outcome: Adequate for Discharge   Problem: Health Behavior/Discharge Planning: Goal: Ability to manage health-related needs will improve 01/19/2021 1533 by Caroll Rancher, RN Outcome: Adequate for Discharge 01/19/2021 0856 by Caroll Rancher, RN Outcome: Progressing   Problem: Activity: Goal: Ability to tolerate increased activity will improve 01/19/2021 1533 by Caroll Rancher, RN Outcome: Adequate for Discharge 01/19/2021 0856 by Caroll Rancher, RN Outcome: Progressing   Problem: Health Behavior/Discharge Planning: Goal: Ability to safely manage health-related needs after discharge will improve Outcome: Adequate for Discharge

## 2021-01-19 NOTE — Consult Note (Signed)
Cardiology Consultation:   Patient ID: Brent Griffith MRN: [id number]; DOB: 10/05/1935  Admit date: 01/18/2021 Date of Consult: 01/19/2021  PCP:  [name], NP   [facility] Providers Cardiologist:  Kirk Ruths, MD   {  Patient Profile:   Brent Griffith is a 85 y.o. male with a hx of CAD s/p CABG, hypertension, hyperlipidemia, anemia and asthma who is being seen 01/19/2021 for the evaluation of atrial fibrillation at the request of Dr. Wynelle Cleveland.  Patient had low risk stress nuclear study 08/2016 with prior inferolateral infarct and mild apical ischemia; EF 48 with apical hypokinesis and mild LVE>> recommended medical therapy.   Patient was doing well on cardiac standpoint when last seen by Dr. Stanford Breed November 16, 2020.  History of Present Illness:   Mr. [name] was diagnosed with COVID-19 on September 10 during ER evaluation for generalized weakness.  He was started on Paxlovid.  He returned to ER on 12th due to positive blood culture for Streptococcus which felt likely contaminate but given Rocephin and started on Augmentin.  Repeat blood culture drawn on 9/12 showed no growth since then.  Yesterday morning he woke up with funny sensation in his chest and. He came to ER and noted to be afib with fairly controlled HR in 90s.  He denies chest pain, shortness of breath, dizziness, lower extremity edema or melena CXR without pneumonia. He is started on heparin for anticoagulation and cardiology is asked for further evaluation. The patient spontaneously converted to sinus rhythm this morning at 6:24am.  He is maintaining sinus rhythm with frequent PVC.  Potassium 3.4.  Creatinine 1.15. Hs-troponin peaked at 333 and then trended down.   Past Medical History:  Diagnosis Date   Acute gastric ulcer without mention of hemorrhage, perforation, or obstruction    Allergic rhinitis, cause unspecified    Anemia, unspecified    Arthritis    Asthma    Blood transfusion without reported diagnosis     CAD (coronary artery disease)    Diaphragmatic hernia without mention of obstruction or gangrene    Elevated prostate specific antigen (PSA)    Enlarged prostate    Esophageal reflux    Extrinsic asthma, unspecified    Herpes zoster without mention of complication    Hyperlipidemia    Hypersomnia with sleep apnea, unspecified    Impotence of organic origin    Kyphosis    Lumbago    Obstructive sleep apnea (adult) (pediatric)    Pneumonia    hx of years ago    Senile osteoporosis    Trigger finger (acquired)    Tubular adenoma of colon 05/2013   Unspecified essential hypertension    Unspecified sleep apnea    CPAP- settings 4-12    Unspecified vitamin D deficiency     Past Surgical History:  Procedure Laterality Date   CARDIAC CATHETERIZATION  03/04/2009   Dr [name]   COLONOSCOPY     CORONARY ARTERY BYPASS GRAFT  2010   CYSTOSCOPY WITH RETROGRADE PYELOGRAM, URETEROSCOPY AND STENT PLACEMENT Right 10/04/2013   Procedure: CYSTOSCOPY WITH RETROGRADE PYELOGRAM,  AND STENT PLACEMENT;  Surgeon: Festus Aloe, MD;  Location: [facility];  Service: Urology;  Laterality: Right;   DENTAL SURGERY N/A    MOLE REMOVAL  1958   chin   ROTATOR CUFF REPAIR Left 04/1998   with bone spur removed, Dr French Ana   TONSILLECTOMY  1945   TRANSURETHRAL RESECTION OF PROSTATE N/A 09/24/2013   Procedure: TRANSURETHRAL RESECTION OF THE PROSTATE (TURP)  WITH GYRUS (STAGED RIGHT LATERAL AND MEDIAN LOBE);  Surgeon: Ailene Rud, MD;  Location: [facility];  Service: Urology;  Laterality: N/A;   UPPER GI ENDOSCOPY       Inpatient Medications: Scheduled Meds:  potassium chloride  40 mEq Oral BID   Continuous Infusions:  heparin 850 Units/hr (01/19/21 1307)   PRN Meds: acetaminophen **OR** acetaminophen  Allergies:    Allergies  Allergen Reactions   Compazine [Prochlorperazine Edisylate] Anxiety    Social History:   Social History   Socioeconomic History   Marital status: Married     Spouse name: Not on file   Number of children: 0   Years of education: Not on file   Highest education level: Not on file  Occupational History   Occupation: Surveyor, quantity  Tobacco Use   Smoking status: Never   Smokeless tobacco: Never  Vaping Use   Vaping Use: Never used  Substance and Sexual Activity   Alcohol use: No   Drug use: No   Sexual activity: Not Currently  Other Topics Concern   Not on file  Social History Narrative   Not on file   Social Determinants of Health   Financial Resource Strain: Not on file  Food Insecurity: Not on file  Transportation Needs: Not on file  Physical Activity: Not on file  Stress: Not on file  Social Connections: Not on file  Intimate Partner Violence: Not on file    Family History:   Family History  Problem Relation Age of Onset   Stroke Mother    Breast cancer Mother    Cirrhosis Mother        wine   Rheumatic fever Mother    Kyphosis Mother    Hypertension Father    Stroke Father    Heart disease Father    Heart disease Paternal Grandfather    Colon cancer Neg Hx      ROS:  Please see the history of present illness.  All other ROS reviewed and negative.     Physical Exam/Data:   Vitals:   01/19/21 0300 01/19/21 0400 01/19/21 0744 01/19/21 1200  BP:  114/76 (!) 125/44 131/64  Pulse: 84 88 72 75  Resp: '19 17 18 18  '$ Temp:  98.6 F (37 C) 98.2 F (36.8 C) 98.3 F (36.8 C)  TempSrc:  Oral Oral Oral  SpO2: 98% 97% 99% 99%  Weight:      Height:        Intake/Output Summary (Last 24 hours) at 01/19/2021 1321 Last data filed at 01/19/2021 1307 Gross per 24 hour  Intake 1603.57 ml  Output 685 ml  Net 918.57 ml   Last 3 Weights 01/18/2021 01/16/2021 01/14/2021  Weight (lbs) 125 lb 126 lb 125 lb  Weight (kg) 56.7 kg 57.153 kg 56.7 kg     Body mass index is 21.8 kg/m.  General:  Well nourished, well developed, in no acute distress HEENT: normal Neck: no JVD Vascular: No carotid bruits; Distal pulses  2+ bilaterally Cardiac:  normal S1, S2; RRR; no murmur  Lungs:  clear to auscultation bilaterally, no wheezing, rhonchi or rales  Abd: soft, nontender, no hepatomegaly  Ext: no edema Musculoskeletal:  No deformities, BUE and BLE strength normal and equal Skin: warm and dry  Neuro:  CNs 2-12 intact, no focal abnormalities noted Psych:  Normal affect   EKG:  The EKG was personally reviewed and demonstrates: Atrial fibrillation, PVC Telemetry:  Telemetry was personally reviewed and demonstrates: Sinus  rhythm with frequent PVC  Relevant CV Studies:  Stress test 08/2016 The left ventricular ejection fraction is mildly decreased (45-54%). Nuclear stress EF: 48%. Defect 1: There is a medium defect of severe severity present in the basal inferolateral and mid inferolateral location. Defect 2: There is a small defect of severe severity present in the apical lateral location. This is a low risk study.   Low risk stress nuclear study with prior inferolateral infarct and mild apical ischemia; EF 48 with apical hypokinesis and mild LVE.  Laboratory Data:  High Sensitivity Troponin:   Recent Labs  Lab 01/18/21 1310 01/18/21 1502 01/18/21 1738 01/18/21 1933 01/19/21 0040  TROPONINIHS 275* 333* 281* 248* 167*     Chemistry Recent Labs  Lab 01/16/21 0847 01/18/21 1310 01/19/21 0040  NA 135 135 138  K 3.5 3.5 3.4*  CL 104 103 105  CO2 20* 24 26  GLUCOSE 117* 128* 112*  BUN 23 26* 22  CREATININE 0.95 1.40* 1.15  CALCIUM 8.7* 8.7* 8.4*  GFRNONAA >60 50* >60  ANIONGAP '11 8 7    '$ Recent Labs  Lab 01/14/21 1256 01/16/21 0847  PROT 6.8 6.7  ALBUMIN 3.7 3.7  AST 26 55*  ALT 20 34  ALKPHOS 44 45  BILITOT 1.6* 1.7*   Lipids No results for input(s): CHOL, TRIG, HDL, LABVLDL, LDLCALC, CHOLHDL in the last 168 hours.  Hematology Recent Labs  Lab 01/16/21 0847 01/18/21 1310 01/19/21 0040  WBC 6.2 6.4 6.0  RBC 3.84* 3.98* 3.81*  HGB 11.5* 11.8* 11.6*  HCT 34.8* 35.0* 34.0*   MCV 90.6 87.9 89.2  MCH 29.9 29.6 30.4  MCHC 33.0 33.7 34.1  RDW 12.6 12.4 12.4  PLT 131* 149* 147*   Thyroid  Recent Labs  Lab 01/19/21 0040  TSH 1.253    BNPNo results for input(s): BNP, PROBNP in the last 168 hours.  DDimer  Recent Labs  Lab 01/19/21 0040  DDIMER 1.23*     Radiology/Studies:  DG Chest Port 1 View  Result Date: 01/18/2021 CLINICAL DATA:  Short of breath.  COVID positive EXAM: PORTABLE CHEST 1 VIEW COMPARISON:  01/16/2021 FINDINGS: Prior CABG without heart failure. COPD with hyperinflation lungs. Mild atelectasis or scarring in the bases. Negative for pneumonia or effusion. IMPRESSION: COPD with bibasilar atelectasis/scarring.  Negative for pneumonia. Electronically Signed   By: Franchot Gallo M.D.   On: 01/18/2021 13:40   DG Chest Port 1 View  Result Date: 01/16/2021 CLINICAL DATA:  cough EXAM: PORTABLE CHEST 1 VIEW COMPARISON:  01/14/2021 FINDINGS: Sternotomy wires overlie normal cardiac silhouette. Normal pulmonary vasculature. No effusion, infiltrate, or pneumothorax. Mild linear atelectasis at the LEFT lung base. No acute osseous abnormality. IMPRESSION: Mild LEFT basilar atelectasis. Electronically Signed   By: Suzy Bouchard M.D.   On: 01/16/2021 08:30     Assessment and Plan:   New onset atrial fibrillation -Yesterday morning patient woke up with a funny sensation in his chest.  He was found to be in atrial fibrillation with fairly controlled heart rate.  Treated with heparin for anticoagulation.  Spontaneously converted this morning at 6:30 AM.  Patient felt better afterwards.  Currently maintaining sinus rhythm with frequent PVCs. -Likely due to COVID -Will discuss rate control medication and anticoagulation with MD (home atenolol on hold since admit) -Pending echocardiogram - TSH normal  - Will start Amiodarone 400 BID x7 days and then '200mg'$  daily - Stop heparin and start Eliquis '5mg'$  BID  - Likely DC amiodarone and Eliquis in outpatient  setting  2.  Elevated troponin with CAD s/p CABG -Patient denies any episode of chest pain - Elevated troponin is demand in setting of COVID -Continue home statin -Aspirin was held on admission   3.  Hypertension -Currently blood pressure stable. -Home atenolol on hold  4.  COVID-19 -Per primary team  5.  Hypokalemia -Will give supplement  6.  PVCs -Keep potassium above 4 -Check magnesium -Resume beta-blocker  Risk Assessment/Risk Scores:   CHA2DS2-VASc Score = 4  This indicates a 4.8% annual risk of stroke. The patient's score is based upon: CHF History: 0 HTN History: 1 Diabetes History: 0 Stroke History: 0 Vascular Disease History: 1 Age Score: 2 Gender Score: 0   For questions or updates, please contact [facility] Please consult [email or web address] for contact info under    Jarrett Soho, PA  01/19/2021 1:21 PM

## 2021-01-19 NOTE — Discharge Summary (Signed)
Physician Discharge Summary  Brent Griffith [id number] DOB: 1935-11-17 DOA: 01/18/2021  PCP: [name], NP  Admit date: 01/18/2021 Discharge date: 01/19/2021  Admitted From: home  Disposition:  home   Recommendations for Outpatient Follow-up:  Multiple medication changed made- please f/u BP/HR and follow for side effects  Home Health:  none  Discharge Condition:  stable   CODE STATUS:  Full code   Diet recommendation:  heart healthy Consultations: cardiology  Procedures/Studies: 2 D ECHO   Discharge Diagnoses:  Principal Problem:   New onset atrial fibrillation ([place]) Active Problems:   AKI (acute kidney injury) ([place])   HFrEF (heart failure with reduced ejection fraction) ([place])   Dyslipidemia   Essential hypertension   G E R D   Obstructive sleep apnea   Benign prostatic hyperplasia   Senile osteoporosis   IDA (iron deficiency anemia)     Brief Summary: Brent Griffith is a 85 y.o. male with medical history significant of hypertension, hyperlipidemia, GERD/PUD, OSA, CAD with history of CABG, chronic anemia, asthma, BPH presented to the ED with chest pain and new onset A. fib, heart rate 97-100 and not given any rate control drugs.  Not hypoxic.  Labs showing WBC 6.4, hemoglobin 11.8 (stable), platelet count 149k.  Sodium 135, potassium 3.5, chloride 103, bicarb 24, BUN 26, creatinine 1.4 (baseline 0.9), glucose 128.  High-sensitivity troponin 275> 333>281>248.  EKG showing new onset A. fib and no acute ischemic changes.  Chest x-ray showing COPD with bibasilar atelectasis/scarring and no evidence of pneumonia.  ED physician spoke to on-call cardiologist who requested admission by hospitalist service and cardiology service will consult.  Patient was given aspirin 324 mg, 500 cc normal saline bolus, and started on IV heparin.   Of note, patient tested positive for COVID on 9/10 and improvement in symptoms on Paxlovid.  He returned to the ED on 9/12 after having a  positive blood culture for Streptococcus which was felt likely to be a contaminant but he was given Rocephin and started on Augmentin. Repeat blood cx drawn 9/12 showing NGTD.   Hospital Course:  Paroxysmal atrial fibrillation with RVR -Likely triggered by his COVID infection - The patient has converted back to normal sinus rhythm - 2D echo reveals an EF of 40 to 45% with hypokinesis which was present on his prior stress test in 2018 - Cardiology recommends amiodarone 400 mg twice daily x7 days followed by 21 days of 200 mg daily, carvedilol 3.125 mg daily and and Eliquis 5 mg twice daily  HFrEF, ischemic cardiomyopathy-coronary artery disease status post CABG - Troponin elevation likely secondary to demand ischemia in setting of A. fib with RVR -Troponin I> 275> 333> 281> 248> 167 -EF 40 to 45% - Adding carvedilol and ACE inhibitor -DC atenolol - No need for diuretics at this time - Continue aspirin and statin  COVID 19 infection - Has completed a course of Paxlovid - Was never hypoxic-symptoms improving  AKI - Creatinine 1.40 on 9/14-has improved to 1.15 - Baseline appears to be 0.9  Anemia, normocytic - Possibly anemia of chronic disease-outpatient follow-up  Mild acute thrombocytopenia -?  If related to COVID - Recommend follow-up as outpatient  BPH - Continue Proscar and Cardura  Discharge Exam: Vitals:   01/19/21 0744 01/19/21 1200  BP: (!) 125/44 131/64  Pulse: 72 75  Resp: 18 18  Temp: 98.2 F (36.8 C) 98.3 F (36.8 C)  SpO2: 99% 99%   Vitals:   01/19/21 0300 01/19/21 0400  01/19/21 0744 01/19/21 1200  BP:  114/76 (!) 125/44 131/64  Pulse: 84 88 72 75  Resp: '19 17 18 18  '$ Temp:  98.6 F (37 C) 98.2 F (36.8 C) 98.3 F (36.8 C)  TempSrc:  Oral Oral Oral  SpO2: 98% 97% 99% 99%  Weight:      Height:        General: Pt is alert, awake, not in acute distress Cardiovascular: RRR, S1/S2 +, no rubs, no gallops Respiratory: CTA bilaterally, no wheezing, no  rhonchi Abdominal: Soft, NT, ND, bowel sounds + Extremities: no edema, no cyanosis   Discharge Instructions  Discharge Instructions     Amb referral to AFIB Clinic   Complete by: As directed    Diet - low sodium heart healthy   Complete by: As directed    Increase activity slowly   Complete by: As directed       Allergies as of 01/19/2021       Reactions   Compazine [prochlorperazine Edisylate] Anxiety        Medication List     STOP taking these medications    amoxicillin-clavulanate 875-125 MG tablet Commonly known as: AUGMENTIN   atenolol 50 MG tablet Commonly known as: TENORMIN   nirmatrelvir/ritonavir EUA 20 x 150 MG & 10 x '100MG'$  Tabs Commonly known as: PAXLOVID       TAKE these medications    acetaminophen 500 MG tablet Commonly known as: TYLENOL Take 1,000 mg by mouth every 6 (six) hours as needed for mild pain or moderate pain.   albuterol 108 (90 Base) MCG/ACT inhaler Commonly known as: VENTOLIN HFA USE 1 TO 2 INHALATIONS BY  MOUTH EVERY 6 HOURS AS  NEEDED FOR WHEEZING OR  SHORTNESS OF BREATH What changed: See the new instructions.   amiodarone 400 MG tablet Commonly known as: PACERONE Take 1 tablet (400 mg total) by mouth 2 (two) times daily.   amiodarone 200 MG tablet Commonly known as: PACERONE Take 1 tablet (200 mg total) by mouth daily. Start taking on: January 26, 2021   apixaban 2.5 MG Tabs tablet Commonly known as: ELIQUIS Take 1 tablet (2.5 mg total) by mouth 2 (two) times daily.   aspirin EC 81 MG tablet Take 1 tablet (81 mg total) by mouth daily.   b complex vitamins tablet Take 1 tablet by mouth daily.   BC HEADACHE POWDER PO Take 1 Package by mouth 2 (two) times daily as needed (headache/pain).   carvedilol 3.125 MG tablet Commonly known as: COREG Take 1 tablet (3.125 mg total) by mouth 2 (two) times daily with a meal.   CoQ10 200 MG Caps Take 200 mg by mouth daily.   doxazosin 4 MG tablet Commonly known as:  CARDURA TAKE 1 TABLET BY MOUTH  DAILY TO HELP CONTROL BLOOD PRESSURE What changed:  how much to take how to take this when to take this additional instructions   EQ CALCIUM CITRATE+D3 PO Take 1,200 mg by mouth daily.   famotidine 20 MG tablet Commonly known as: PEPCID Take 1 tablet (20 mg total) by mouth daily.   fexofenadine 180 MG tablet Commonly known as: ALLEGRA Take 180 mg by mouth daily as needed for allergies.   finasteride 5 MG tablet Commonly known as: PROSCAR TAKE 1 TABLET BY MOUTH  EVERY MORNING What changed: when to take this   folic acid 1 MG tablet Commonly known as: FOLVITE TAKE 1 TABLET BY MOUTH  DAILY   Goodys Extra Strength 500-325-65 MG Pack  Generic drug: Aspirin-Acetaminophen-Caffeine Take 1 Package by mouth 2 (two) times daily as needed (pain/headache).   lisinopril 5 MG tablet Commonly known as: ZESTRIL Take 1 tablet (5 mg total) by mouth daily.   multivitamin tablet Take 1 tablet by mouth daily.   ondansetron 4 MG tablet Commonly known as: ZOFRAN Take 1 tablet (4 mg total) by mouth every 6 (six) hours.   pantoprazole 40 MG tablet Commonly known as: PROTONIX Take 1 tablet (40 mg total) by mouth daily.   rosuvastatin 20 MG tablet Commonly known as: CRESTOR TAKE 1 TABLET BY MOUTH ONCE DAILY FOR CHOLESTEROL What changed:  how much to take how to take this when to take this additional instructions   Vitamin D-3 125 MCG (5000 UT) Tabs Take 10,000 Units by mouth daily.        Follow-up Information     Lelon Perla, MD Follow up.   Specialty: Cardiology Why: office will call with date and time Contact information: [address] [place] [address] [phone]                Allergies  Allergen Reactions   Compazine [Prochlorperazine Edisylate] Anxiety      CT ABDOMEN PELVIS W CONTRAST  Result Date: 01/14/2021 CLINICAL DATA:  Abdominal pain and fever. EXAM: CT ABDOMEN AND PELVIS WITH CONTRAST  TECHNIQUE: Multidetector CT imaging of the abdomen and pelvis was performed using the standard protocol following bolus administration of intravenous contrast. CONTRAST:  41m OMNIPAQUE IOHEXOL 350 MG/ML SOLN COMPARISON:  CT abdomen pelvis dated August 13, 2016. FINDINGS: Lower chest: No acute abnormality. Hepatobiliary: Few scattered subcentimeter low-density lesions within the liver are unchanged but remain too small to characterize. No new focal liver abnormality. Gallstones again noted. No gallbladder wall thickening or biliary dilatation. Pancreas: Unremarkable. No pancreatic ductal dilatation or surrounding inflammatory changes. Spleen: Few punctate calcified granulomas again noted. Normal in size. Adrenals/Urinary Tract: Adrenal glands and left kidney are unremarkable. Slight interval increase in size of the small simple cyst in the right kidney. No hydronephrosis. Mild circumferential bladder wall thickening, similar to prior study. Stomach/Bowel: Chronic gastric wall thickening. No obstruction. Sigmoid diverticulosis. Normal appendix. Vascular/Lymphatic: Aortic atherosclerosis. No enlarged abdominal or pelvic lymph nodes. Reproductive: Unchanged prostatomegaly with median lobe hypertrophy indenting the bladder base. Other: New trace free fluid in the pelvis.  No pneumoperitoneum. Musculoskeletal: No acute or significant osseous findings. IMPRESSION: 1. New trace free fluid in the pelvis, nonspecific. 2. Chronic gastric wall thickening, not significantly changed since 2015 and potentially in part due to underdistension. Correlate for gastritis. 3. Unchanged cholelithiasis. 4. Aortic Atherosclerosis (ICD10-I70.0). Electronically Signed   By: WTitus DubinM.D.   On: 01/14/2021 15:03   DG BONE DENSITY (DXA)  Result Date: 01/02/2021 EXAM: DUAL X-RAY ABSORPTIOMETRY (DXA) FOR BONE MINERAL DENSITY IMPRESSION: RTelfordS BASSETT Your patient JJaeveon Sulecompleted a BMD test on 01/02/2021 using the LTumwater(analysis version: 16.SP2) manufactured by [employer] The following summarizes the results of our evaluation. PATIENT: Name: KEmontae, IsabellaPatient ID: [id number] Date: 002-Apr-1937Height: 63.5 in. Gender: Male Measured: 01/02/2021 Weight: 129.4 lbs. Indications: Advanced Age, Caucasian, History of Osteopenia Fractures: Treatments: Calcium, Fosamax(Alendronate), Multivitamin, Prolia, Vitamin D ASSESSMENT: The BMD measured at AP Spine L1-L4 is 0.959 g/cm2 with a T-score of -1.8. Th patient is osteopenic according to [facility] criteria. The patient will not get a FRAX because he has only been off BBM for a year and a half.  Site Region Measured Date Measured Age WHO YA BMD Classification T-score AP Spine L1-L4 01/02/20[age] N/A -1.8 0.959 g/cm2 DualFemur Neck Left 01/02/2021 83.1 years N/A -1.5 0.835 g/cm2 Left Forearm Radius 33% 01/02/2021 83.1 N/A -1.3 0.856 g/cm2 World Health Organization [facility] criteria for post-menopausal, Caucasian Women: Normal       T-score at or above -1 SD Osteopenia   T-score between -1 and -2.5 SD Osteoporosis T-score at or below -2.5 SD RECOMMENDATION: 1. All patients should optimize calcium and vitamin D intake. 2. Consider FDA-approved medical therapies in postmenopausal women and men aged 60 years and older, based on the following: a. A hip or vertebral(clinical or morphometric) fracture. b. T-Score < -2.5 at the femoral neck or spine after appropriate evaluation to exclude secondary causes c. Low bone mass (T-score between -1.0 and -2.5 at the femoral neck or spine) and a 10 year probability of a hip fracture >3% or a 10 year probability of major osteoporosis-related fracture > 20% based on the [place]-adapted WHO algorithm d. Clinical judgement and/or patient preferences may indicate treatment for people with 10-year fracture probabilities above or below these levels FOLLOW-UP: People with diagnosed cases of osteoporosis or osteopenia should be  regularly tested for bone mineral density. For patients eligible for Medicare, routine testing is allowed once every 2 years. The testing frequency can be increased to one year for patients who have rapidly progressing disease, or for those who are receiving medical therapy to restore bone mass. I have reviewed this report and agree with the above findings. Mark A. Thornton Papas, M.D. [facility] Radiology Electronically Signed   By: Lavonia Dana M.D.   On: 01/02/2021 13:03   DG Chest Port 1 View  Result Date: 01/18/2021 CLINICAL DATA:  Short of breath.  COVID positive EXAM: PORTABLE CHEST 1 VIEW COMPARISON:  01/16/2021 FINDINGS: Prior CABG without heart failure. COPD with hyperinflation lungs. Mild atelectasis or scarring in the bases. Negative for pneumonia or effusion. IMPRESSION: COPD with bibasilar atelectasis/scarring.  Negative for pneumonia. Electronically Signed   By: Franchot Gallo M.D.   On: 01/18/2021 13:40   DG Chest Port 1 View  Result Date: 01/16/2021 CLINICAL DATA:  cough EXAM: PORTABLE CHEST 1 VIEW COMPARISON:  01/14/2021 FINDINGS: Sternotomy wires overlie normal cardiac silhouette. Normal pulmonary vasculature. No effusion, infiltrate, or pneumothorax. Mild linear atelectasis at the LEFT lung base. No acute osseous abnormality. IMPRESSION: Mild LEFT basilar atelectasis. Electronically Signed   By: Suzy Bouchard M.D.   On: 01/16/2021 08:30   DG Chest Port 1 View  Result Date: 01/14/2021 CLINICAL DATA:  Cough and abdomen pain for 2 days. EXAM: PORTABLE CHEST 1 VIEW COMPARISON:  January 28, 2017 FINDINGS: The heart size and mediastinal contours are stable. Mild linear atelectasis or scar is identified in the left lung base. The lungs are otherwise clear. The lungs are hyperinflated. The visualized skeletal structures are unremarkable. IMPRESSION: No active cardiopulmonary disease. COPD. Electronically Signed   By: Abelardo Diesel M.D.   On: 01/14/2021 13:29   ECHOCARDIOGRAM LIMITED  Result  Date: 01/19/2021    ECHOCARDIOGRAM LIMITED REPORT   Patient Name:   Brent Griffith Date of Exam: 01/19/2021 Medical Rec #:  [id number]     Height:       63.5 in Accession #:    [id number]    Weight:       125.0 lb Date of Birth:  Sep 24, 1935     BSA:          1.593 m Patient  Age:    59 years      BP:           125/44 mmHg Patient Gender: M             HR:           72 bpm. Exam Location:  Inpatient Procedure: Limited Echo, Cardiac Doppler and Color Doppler Indications:    Atrial fibrillation  History:        Patient has prior history of Echocardiogram examinations, most                 recent 03/20/2014. CAD, Prior CABG, COPD, Arrythmias:Atrial                 Fibrillation, Signs/Symptoms:Chest Pain; Risk                 Factors:Hypertension, Dyslipidemia and Sleep Apnea. COVID+.  Sonographer:    Dustin Flock RDCS Referring Phys: [id number] [place]  1. Compared to echo from 2015, LVEF is depressed with wall motion abnormalities noted.  2. LVEF is depressed with hypokinesis inf the inferior and distal lateral and apical walls . Left ventricular ejection fraction, by estimation, is 40 to 45%. The left ventricle has mildly decreased function.  3. Right ventricular systolic function is normal. The right ventricular size is normal. There is mildly elevated pulmonary artery systolic pressure.  4. Mild mitral valve regurgitation.  5. Aortic valve regurgitation is mild. Mild aortic valve sclerosis is present, with no evidence of aortic valve stenosis. FINDINGS  Left Ventricle: LVEF is depressed with hypokinesis inf the inferior and distal lateral and apical walls. Left ventricular ejection fraction, by estimation, is 40 to 45%. The left ventricle has mildly decreased function. The left ventricular internal cavity size was normal in size. There is no left ventricular hypertrophy. Right Ventricle: The right ventricular size is normal. Right ventricular systolic function is normal. There is mildly elevated  pulmonary artery systolic pressure. The tricuspid regurgitant velocity is 2.99 m/s, and with an assumed right atrial pressure of 3 mmHg, the estimated right ventricular systolic pressure is [id number] mmHg. Left Atrium: Left atrial size was normal in size. Right Atrium: Right atrial size was normal in size. Pericardium: There is no evidence of pericardial effusion. Mitral Valve: There is mild thickening of the mitral valve leaflet(s). Mild mitral valve regurgitation. Tricuspid Valve: The tricuspid valve is normal in structure. Tricuspid valve regurgitation is mild. Aortic Valve: Aortic valve regurgitation is mild. Aortic regurgitation PHT measures 699 msec. Mild aortic valve sclerosis is present, with no evidence of aortic valve stenosis. Pulmonic Valve: The pulmonic valve was grossly normal. Aorta: The aortic root is normal in size and structure. IAS/Shunts: No atrial level shunt detected by color flow Doppler. LEFT VENTRICLE PLAX 2D LVIDd:         4.20 cm  Diastology LVIDs:         3.30 cm  LV e' medial:    5.11 cm/s LV PW:         1.10 cm  LV E/e' medial:  13.9 LV IVS:        1.00 cm  LV e' lateral:   7.94 cm/s LVOT diam:     2.00 cm  LV E/e' lateral: 9.0 LVOT Area:     3.14 cm  LEFT ATRIUM         Index LA diam:    4.20 cm 2.64 cm/m  AORTIC VALVE AI PHT:      699 msec  AORTA Ao Root diam: 2.70 cm MITRAL VALVE               TRICUSPID VALVE MV Area (PHT): 4.46 cm    TR Peak grad:   35.8 mmHg MV Decel Time: 170 msec    TR Vmax:        299.00 cm/s MV E velocity: 71.10 cm/s MV A velocity: 61.30 cm/s  SHUNTS MV E/A ratio:  1.16        Systemic Diam: 2.00 cm Dorris Carnes MD Electronically signed by Dorris Carnes MD Signature Date/Time: 01/19/2021/1:48:47 PM    Final      The results of significant diagnostics from this hospitalization (including imaging, microbiology, ancillary and laboratory) are listed below for reference.     Microbiology: Recent Results (from the past 240 hour(s))  Blood culture (routine single)      Status: Abnormal   Collection Time: 01/14/21 12:54 PM   Specimen: Right Antecubital; Blood  Result Value Ref Range Status   Specimen Description   Final    RIGHT ANTECUBITAL BLOOD Performed at [facility], [address] [place], [place] [address]    Special Requests   Final    Blood Culture adequate volume BOTTLES DRAWN AEROBIC AND ANAEROBIC Performed at [facility], [address] [place], [place] [address]    Culture  Setup Time   Final    GRAM POSITIVE COCCI AEROBIC BOTTLE ONLY CRITICAL RESULT CALLED TO, READ BACK BY AND VERIFIED WITH: RN VALERIA POWELL 01/16/21'@00'$ :23 BY TW    Culture (A)  Final    STREPTOCOCCUS MITIS/ORALIS THE SIGNIFICANCE OF ISOLATING THIS ORGANISM FROM A SINGLE VENIPUNCTURE CANNOT BE PREDICTED WITHOUT FURTHER CLINICAL AND CULTURE CORRELATION. SUSCEPTIBILITIES AVAILABLE ONLY ON REQUEST. Performed at [facility], [place] [address], [place], [place] [address]    Report Status 01/17/2021 FINAL  Final  Blood Culture ID Panel (Reflexed)     Status: Abnormal   Collection Time: 01/14/21 12:54 PM  Result Value Ref Range Status   Enterococcus faecalis NOT DETECTED NOT DETECTED Corrected   Enterococcus Faecium NOT DETECTED NOT DETECTED Corrected   Listeria monocytogenes NOT DETECTED NOT DETECTED Corrected   Staphylococcus species NOT DETECTED NOT DETECTED Corrected   Staphylococcus aureus (BCID) NOT DETECTED NOT DETECTED Corrected   Staphylococcus epidermidis NOT DETECTED NOT DETECTED Corrected   Staphylococcus lugdunensis NOT DETECTED NOT DETECTED Corrected   Streptococcus species DETECTED (A) NOT DETECTED Corrected    Comment: Not Enterococcus species, Streptococcus agalactiae, Streptococcus pyogenes, or Streptococcus pneumoniae. CRITICAL RESULT CALLED TO, READ BACK BY AND VERIFIED WITH: RN VALERIE POWELL 01/16/21'@00'$ :32 BY TW    Streptococcus agalactiae NOT DETECTED NOT DETECTED Corrected   Streptococcus pneumoniae NOT DETECTED NOT  DETECTED Corrected   Streptococcus pyogenes NOT DETECTED NOT DETECTED Corrected   A.calcoaceticus-baumannii NOT DETECTED NOT DETECTED Corrected   Bacteroides fragilis NOT DETECTED NOT DETECTED Corrected   Enterobacterales NOT DETECTED NOT DETECTED Corrected   Enterobacter cloacae complex NOT DETECTED NOT DETECTED Corrected   Escherichia coli NOT DETECTED NOT DETECTED Corrected   Klebsiella aerogenes NOT DETECTED NOT DETECTED Corrected   Klebsiella oxytoca NOT DETECTED NOT DETECTED Corrected   Klebsiella pneumoniae NOT DETECTED NOT DETECTED Corrected   Proteus species NOT DETECTED NOT DETECTED Corrected   Salmonella species NOT DETECTED NOT DETECTED Corrected   Serratia marcescens NOT DETECTED NOT DETECTED Corrected   Haemophilus influenzae NOT DETECTED NOT DETECTED Corrected   Neisseria meningitidis NOT DETECTED NOT DETECTED Corrected   Pseudomonas  aeruginosa NOT DETECTED NOT DETECTED Corrected   Stenotrophomonas maltophilia NOT DETECTED NOT DETECTED Corrected   Candida albicans NOT DETECTED NOT DETECTED Corrected   Candida auris NOT DETECTED NOT DETECTED Corrected   Candida glabrata NOT DETECTED NOT DETECTED Corrected   Candida krusei NOT DETECTED NOT DETECTED Corrected   Candida parapsilosis NOT DETECTED NOT DETECTED Corrected   Candida tropicalis NOT DETECTED NOT DETECTED Corrected   Cryptococcus neoformans/gattii NOT DETECTED NOT DETECTED Corrected    Comment: Performed at [facility], [place] [address], [place], [place] [address]  Resp Panel by RT-PCR (Flu A&B, Covid) Peripheral     Status: Abnormal   Collection Time: 01/14/21 12:56 PM   Specimen: Peripheral; Nasopharyngeal(NP) swabs in vial transport medium  Result Value Ref Range Status   SARS Coronavirus 2 by RT PCR POSITIVE (A) NEGATIVE Final    Comment: RESULT CALLED TO, READ BACK BY AND VERIFIED WITH:  DODSON,J RN '@1405'$  01/14/21 EDENSCA (NOTE) SARS-CoV-2 target nucleic acids are DETECTED.  The SARS-CoV-2 RNA is  generally detectable in upper respiratory specimens during the acute phase of infection. Positive results are indicative of the presence of the identified virus, but do not rule out bacterial infection or co-infection with other pathogens not detected by the test. Clinical correlation with patient history and other diagnostic information is necessary to determine patient infection status. The expected result is Negative.  Fact Sheet for Patients: EntrepreneurPulse.com.au  Fact Sheet for Healthcare Providers: IncredibleEmployment.be  This test is not yet approved or cleared by the [place] FDA and  has been authorized for detection and/or diagnosis of SARS-CoV-2 by FDA under an Emergency Use Authorization (EUA).  This EUA will remain in effect (meaning this test can b e used) for the duration of  the COVID-19 declaration under Section 564(b)(1) of the Act, 21 U.S.C. section 360bbb-3(b)(1), unless the authorization is terminated or revoked sooner.     Influenza A by PCR NEGATIVE NEGATIVE Final   Influenza B by PCR NEGATIVE NEGATIVE Final    Comment: (NOTE) The Xpert Xpress SARS-CoV-2/FLU/RSV plus assay is intended as an aid in the diagnosis of influenza from Nasopharyngeal swab specimens and should not be used as a sole basis for treatment. Nasal washings and aspirates are unacceptable for Xpert Xpress SARS-CoV-2/FLU/RSV testing.  Fact Sheet for Patients: EntrepreneurPulse.com.au  Fact Sheet for Healthcare Providers: IncredibleEmployment.be  This test is not yet approved or cleared by the [place] FDA and has been authorized for detection and/or diagnosis of SARS-CoV-2 by FDA under an Emergency Use Authorization (EUA). This EUA will remain in effect (meaning this test can be used) for the duration of the COVID-19 declaration under Section 564(b)(1) of the Act, 21 U.S.C. section 360bbb-3(b)(1),  unless the authorization is terminated or revoked.  Performed at [facility], [address] [place], [place] [address]   Culture, blood (routine x 2)     Status: None (Preliminary result)   Collection Time: 01/16/21  8:20 AM   Specimen: BLOOD  Result Value Ref Range Status   Specimen Description   Final    BLOOD BLOOD LEFT FOREARM Performed at [facility] [address] [place], [place] [address]    Special Requests   Final    BOTTLES DRAWN AEROBIC AND ANAEROBIC Blood Culture adequate volume Performed at [facility] [address], [place], [place] [address]    Culture   Final    NO GROWTH 3 DAYS Performed at [facility], [address] [place], [place] [address]    Report Status PENDING  Incomplete  Culture, blood (routine x 2)     Status: None (Preliminary result)   Collection Time: 01/16/21  8:23 AM   Specimen: BLOOD  Result Value Ref Range Status   Specimen Description   Final    BLOOD BLOOD RIGHT FOREARM Performed at [facility] [address], [place], [place] [address]    Special Requests   Final    BOTTLES DRAWN AEROBIC AND ANAEROBIC Blood Culture adequate volume Performed at [facility] [address] [place], [place] [address]    Culture   Final    NO GROWTH 3 DAYS Performed at [facility], [place] [address] [place], [place] [address]    Report Status PENDING  Incomplete     Labs: BNP (last 3 results) No results for input(s): BNP in the last 8760 hours. Basic Metabolic Panel: Recent Labs  Lab 01/14/21 1256 01/16/21 0847 01/18/21 1310 01/19/21 0040  NA 138 135 135 138  K 4.2 3.5 3.5 3.4*  CL 106 104 103 105  CO2 25 20* 24 26  GLUCOSE 108* 117* 128* 112*  BUN 23 23 26* 22  CREATININE 1.15 0.95 1.40* 1.15  CALCIUM 8.8* 8.7* 8.7* 8.4*  MG  --   --   --  1.6*   Liver Function Tests: Recent Labs  Lab 01/14/21 1256 01/16/21 0847  AST 26  55*  ALT 20 34  ALKPHOS 44 45  BILITOT 1.6* 1.7*  PROT 6.8 6.7  ALBUMIN 3.7 3.7   No results for input(s): LIPASE, AMYLASE in the last 168 hours. No results for input(s): AMMONIA in the last 168 hours. CBC: Recent Labs  Lab 01/14/21 1256 01/16/21 0847 01/18/21 1310 01/19/21 0040  WBC 4.7 6.2 6.4 6.0  NEUTROABS 3.5 4.9 4.7  --   HGB 11.3* 11.5* 11.8* 11.6*  HCT 34.6* 34.8* 35.0* 34.0*  MCV 90.6 90.6 87.9 89.2  PLT 134* 131* 149* 147*   Cardiac Enzymes: No results for input(s): CKTOTAL, CKMB, CKMBINDEX, TROPONINI in the last 168 hours. BNP: Invalid input(s): POCBNP CBG: No results for input(s): GLUCAP in the last 168 hours. D-Dimer Recent Labs    01/19/21 0040  DDIMER 1.23*   Hgb A1c No results for input(s): HGBA1C in the last 72 hours. Lipid Profile No results for input(s): CHOL, HDL, LDLCALC, TRIG, CHOLHDL, LDLDIRECT in the last 72 hours. Thyroid function studies Recent Labs    01/19/21 0040  TSH 1.253   Anemia work up Recent Labs    01/19/21 0040  FERRITIN 101   Urinalysis    Component Value Date/Time   COLORURINE AMBER (A) 10/03/2013 1615   APPEARANCEUR TURBID (A) 10/03/2013 1615   LABSPEC 1.021 10/03/2013 1615   PHURINE 6.0 10/03/2013 1615   GLUCOSEU NEGATIVE 10/03/2013 1615   HGBUR LARGE (A) 10/03/2013 1615   BILIRUBINUR NEGATIVE 10/03/2013 1615   KETONESUR NEGATIVE 10/03/2013 1615   PROTEINUR >300 (A) 10/03/2013 1615   UROBILINOGEN 0.2 10/03/2013 1615   NITRITE NEGATIVE 10/03/2013 1615   LEUKOCYTESUR MODERATE (A) 10/03/2013 1615   Sepsis Labs Invalid input(s): PROCALCITONIN,  WBC,  LACTICIDVEN Microbiology Recent Results (from the past 240 hour(s))  Blood culture (routine single)     Status: Abnormal   Collection Time: 01/14/21 12:54 PM   Specimen: Right Antecubital; Blood  Result Value Ref Range Status   Specimen Description   Final    RIGHT ANTECUBITAL BLOOD Performed  at [facility], [address] [place], [place]  [address]    Special Requests   Final    Blood Culture adequate volume BOTTLES DRAWN AEROBIC AND ANAEROBIC Performed at [facility], [address] [place], [place] [address]    Culture  Setup Time   Final    GRAM POSITIVE COCCI AEROBIC BOTTLE ONLY CRITICAL RESULT CALLED TO, READ BACK BY AND VERIFIED WITH: RN VALERIA POWELL 01/16/21'@00'$ :110 BY TW    Culture (A)  Final    STREPTOCOCCUS MITIS/ORALIS THE SIGNIFICANCE OF ISOLATING THIS ORGANISM FROM A SINGLE VENIPUNCTURE CANNOT BE PREDICTED WITHOUT FURTHER CLINICAL AND CULTURE CORRELATION. SUSCEPTIBILITIES AVAILABLE ONLY ON REQUEST. Performed at [facility], [place] [address], [place], [place] [address]    Report Status 01/17/2021 FINAL  Final  Blood Culture ID Panel (Reflexed)     Status: Abnormal   Collection Time: 01/14/21 12:54 PM  Result Value Ref Range Status   Enterococcus faecalis NOT DETECTED NOT DETECTED Corrected   Enterococcus Faecium NOT DETECTED NOT DETECTED Corrected   Listeria monocytogenes NOT DETECTED NOT DETECTED Corrected   Staphylococcus species NOT DETECTED NOT DETECTED Corrected   Staphylococcus aureus (BCID) NOT DETECTED NOT DETECTED Corrected   Staphylococcus epidermidis NOT DETECTED NOT DETECTED Corrected   Staphylococcus lugdunensis NOT DETECTED NOT DETECTED Corrected   Streptococcus species DETECTED (A) NOT DETECTED Corrected    Comment: Not Enterococcus species, Streptococcus agalactiae, Streptococcus pyogenes, or Streptococcus pneumoniae. CRITICAL RESULT CALLED TO, READ BACK BY AND VERIFIED WITH: RN VALERIE POWELL 01/16/21'@00'$ :31 BY TW    Streptococcus agalactiae NOT DETECTED NOT DETECTED Corrected   Streptococcus pneumoniae NOT DETECTED NOT DETECTED Corrected   Streptococcus pyogenes NOT DETECTED NOT DETECTED Corrected   A.calcoaceticus-baumannii NOT DETECTED NOT DETECTED Corrected   Bacteroides fragilis NOT DETECTED NOT DETECTED Corrected   Enterobacterales NOT DETECTED NOT DETECTED Corrected    Enterobacter cloacae complex NOT DETECTED NOT DETECTED Corrected   Escherichia coli NOT DETECTED NOT DETECTED Corrected   Klebsiella aerogenes NOT DETECTED NOT DETECTED Corrected   Klebsiella oxytoca NOT DETECTED NOT DETECTED Corrected   Klebsiella pneumoniae NOT DETECTED NOT DETECTED Corrected   Proteus species NOT DETECTED NOT DETECTED Corrected   Salmonella species NOT DETECTED NOT DETECTED Corrected   Serratia marcescens NOT DETECTED NOT DETECTED Corrected   Haemophilus influenzae NOT DETECTED NOT DETECTED Corrected   Neisseria meningitidis NOT DETECTED NOT DETECTED Corrected   Pseudomonas aeruginosa NOT DETECTED NOT DETECTED Corrected   Stenotrophomonas maltophilia NOT DETECTED NOT DETECTED Corrected   Candida albicans NOT DETECTED NOT DETECTED Corrected   Candida auris NOT DETECTED NOT DETECTED Corrected   Candida glabrata NOT DETECTED NOT DETECTED Corrected   Candida krusei NOT DETECTED NOT DETECTED Corrected   Candida parapsilosis NOT DETECTED NOT DETECTED Corrected   Candida tropicalis NOT DETECTED NOT DETECTED Corrected   Cryptococcus neoformans/gattii NOT DETECTED NOT DETECTED Corrected    Comment: Performed at [facility], [place] [address], [place], [place] [address]  Resp Panel by RT-PCR (Flu A&B, Covid) Peripheral     Status: Abnormal   Collection Time: 01/14/21 12:56 PM   Specimen: Peripheral; Nasopharyngeal(NP) swabs in vial transport medium  Result Value Ref Range Status   SARS Coronavirus 2 by RT PCR POSITIVE (A) NEGATIVE Final    Comment: RESULT CALLED TO, READ BACK BY AND VERIFIED WITH:  DODSON,J RN '@1405'$  01/14/21 EDENSCA (NOTE) SARS-CoV-2 target nucleic acids are DETECTED.  The SARS-CoV-2 RNA is generally detectable in upper respiratory specimens during  the acute phase of infection. Positive results are indicative of the presence of the identified virus, but do not rule out bacterial infection or co-infection with other pathogens not detected by the test.  Clinical correlation with patient history and other diagnostic information is necessary to determine patient infection status. The expected result is Negative.  Fact Sheet for Patients: EntrepreneurPulse.com.au  Fact Sheet for Healthcare Providers: IncredibleEmployment.be  This test is not yet approved or cleared by the [place] FDA and  has been authorized for detection and/or diagnosis of SARS-CoV-2 by FDA under an Emergency Use Authorization (EUA).  This EUA will remain in effect (meaning this test can b e used) for the duration of  the COVID-19 declaration under Section 564(b)(1) of the Act, 21 U.S.C. section 360bbb-3(b)(1), unless the authorization is terminated or revoked sooner.     Influenza A by PCR NEGATIVE NEGATIVE Final   Influenza B by PCR NEGATIVE NEGATIVE Final    Comment: (NOTE) The Xpert Xpress SARS-CoV-2/FLU/RSV plus assay is intended as an aid in the diagnosis of influenza from Nasopharyngeal swab specimens and should not be used as a sole basis for treatment. Nasal washings and aspirates are unacceptable for Xpert Xpress SARS-CoV-2/FLU/RSV testing.  Fact Sheet for Patients: EntrepreneurPulse.com.au  Fact Sheet for Healthcare Providers: IncredibleEmployment.be  This test is not yet approved or cleared by the [place] FDA and has been authorized for detection and/or diagnosis of SARS-CoV-2 by FDA under an Emergency Use Authorization (EUA). This EUA will remain in effect (meaning this test can be used) for the duration of the COVID-19 declaration under Section 564(b)(1) of the Act, 21 U.S.C. section 360bbb-3(b)(1), unless the authorization is terminated or revoked.  Performed at [facility], [address] [place], [place] [address]   Culture, blood (routine x 2)     Status: None (Preliminary result)   Collection Time: 01/16/21  8:20 AM   Specimen: BLOOD   Result Value Ref Range Status   Specimen Description   Final    BLOOD BLOOD LEFT FOREARM Performed at [facility] [address], [place], [place] [address]    Special Requests   Final    BOTTLES DRAWN AEROBIC AND ANAEROBIC Blood Culture adequate volume Performed at [facility] [address], [place], [place] [address]    Culture   Final    NO GROWTH 3 DAYS Performed at [facility], [place] [address], [place], [place] [address]    Report Status PENDING  Incomplete  Culture, blood (routine x 2)     Status: None (Preliminary result)   Collection Time: 01/16/21  8:23 AM   Specimen: BLOOD  Result Value Ref Range Status   Specimen Description   Final    BLOOD BLOOD RIGHT FOREARM Performed at [facility] [address], [place], [place] [address]    Special Requests   Final    BOTTLES DRAWN AEROBIC AND ANAEROBIC Blood Culture adequate volume Performed at [facility] [address] [place], [place] [address]    Culture   Final    NO GROWTH 3 DAYS Performed at [facility], [place] [address] [place],  [address]    Report Status PENDING  Incomplete     Time coordinating discharge in minutes: 65  SIGNED:   Debbe Odea, MD  [facility] 01/19/2021, 3:19 PM

## 2021-01-19 NOTE — Progress Notes (Signed)
  Echocardiogram 2D Echocardiogram has been performed.  [place] Griffith 01/19/2021, 10:18 AM

## 2021-01-19 NOTE — Plan of Care (Signed)
  Problem: Health Behavior/Discharge Planning: Goal: Ability to manage health-related needs will improve Outcome: Progressing   Problem: Education: Goal: Knowledge of disease or condition will improve Outcome: Progressing   Problem: Activity: Goal: Ability to tolerate increased activity will improve Outcome: Progressing

## 2021-01-19 NOTE — Progress Notes (Signed)
Patient being discharged home.  IV's removed with the catheters intact.  Discharge instructions and prescription information given to the patient who verbalized understanding.  Patient to be transported by his wife.

## 2021-01-19 NOTE — Progress Notes (Addendum)
ANTICOAGULATION CONSULT NOTE - Follow-Up Consult  Pharmacy Consult for heparin to Eliquis Indication: Afib  Allergies  Allergen Reactions   Compazine [Prochlorperazine Edisylate] Anxiety    Patient Measurements: Height: 5' 3.5" (161.3 cm) Weight: 56.7 kg (125 lb) IBW/kg (Calculated) : 58.05 Heparin Dosing Weight: 57 kg  Vital Signs: Temp: 98.2 F (36.8 C) (09/15 0744) Temp Source: Oral (09/15 0744) BP: 125/44 (09/15 0744) Pulse Rate: 72 (09/15 0744)  Labs: Recent Labs    01/18/21 1310 01/18/21 1502 01/18/21 1738 01/18/21 1933 01/19/21 0040 01/19/21 1058  HGB 11.8*  --   --   --  11.6*  --   HCT 35.0*  --   --   --  34.0*  --   PLT 149*  --   --   --  147*  --   APTT 33  --   --   --   --   --   LABPROT 14.6  --   --   --   --   --   INR 1.1  --   --   --   --   --   HEPARINUNFRC  --   --   --   --  0.23* 0.31  CREATININE 1.40*  --   --   --  1.15  --   TROPONINIHS 275*   < > 281* 248* 167*  --    < > = values in this interval not displayed.     Estimated Creatinine Clearance: 39 mL/min (by C-G formula based on SCr of 1.15 mg/dL).    Assessment: 85 yo male presented with chest pain in setting of atrial fibrillation, elevated troponin (Tmax 333). Patient not on anticoagulation PTA. Pharmacy consulted to dose heparin for Afib.  Heparin level now therapeutic - Changed to apixaban this afternoon - 2.5 mg po BID for age > 80 and weight less than 60 kg  Goal of Therapy:  Heparin level 0.3-0.7 units/ml Monitor platelets by anticoagulation protocol: Yes   Plan:  To apixaban 2.5 mg po BID  Thank you  Anette Guarneri, PharmD  Please check [email or web address] for unit-specific pharmacy phone numbers.

## 2021-01-19 NOTE — Progress Notes (Addendum)
ANTICOAGULATION CONSULT NOTE - Follow-Up Consult  Pharmacy Consult for heparin Indication: Afib  Allergies  Allergen Reactions   Compazine [Prochlorperazine Edisylate] Anxiety    Patient Measurements: Height: 5' 3.5" (161.3 cm) Weight: 56.7 kg (125 lb) IBW/kg (Calculated) : 58.05 Heparin Dosing Weight: 57 kg  Vital Signs: Temp: 98.8 F (37.1 C) (09/15 0000) Temp Source: Oral (09/15 0000) BP: 116/68 (09/15 0000) Pulse Rate: 93 (09/15 0200)  Labs: Recent Labs    01/16/21 0847 01/18/21 1310 01/18/21 1502 01/18/21 1738 01/18/21 1933 01/19/21 0040  HGB 11.5* 11.8*  --   --   --  11.6*  HCT 34.8* 35.0*  --   --   --  34.0*  PLT 131* 149*  --   --   --  147*  APTT  --  33  --   --   --   --   LABPROT  --  14.6  --   --   --   --   INR  --  1.1  --   --   --   --   HEPARINUNFRC  --   --   --   --   --  0.23*  CREATININE 0.95 1.40*  --   --   --  1.15  TROPONINIHS  --  275*   < > 281* 248* 167*   < > = values in this interval not displayed.     Estimated Creatinine Clearance: 39 mL/min (by C-G formula based on SCr of 1.15 mg/dL).   Medical History: Past Medical History:  Diagnosis Date   Acute gastric ulcer without mention of hemorrhage, perforation, or obstruction    Allergic rhinitis, cause unspecified    Anemia, unspecified    Arthritis    Asthma    Blood transfusion without reported diagnosis    CAD (coronary artery disease)    Diaphragmatic hernia without mention of obstruction or gangrene    Elevated prostate specific antigen (PSA)    Enlarged prostate    Esophageal reflux    Extrinsic asthma, unspecified    Herpes zoster without mention of complication    Hyperlipidemia    Hypersomnia with sleep apnea, unspecified    Impotence of organic origin    Kyphosis    Lumbago    Obstructive sleep apnea (adult) (pediatric)    Pneumonia    hx of years ago    Senile osteoporosis    Trigger finger (acquired)    Tubular adenoma of colon 05/2013   Unspecified  essential hypertension    Unspecified sleep apnea    CPAP- settings 4-12    Unspecified vitamin D deficiency     Medications:  Infusions:   sodium chloride 75 mL/hr at 01/19/21 0052   heparin 650 Units/hr (01/18/21 1625)    Assessment: 85 yo male presented with chest pain in setting of atrial fibrillation, elevated troponin (Tmax 333). Patient not on anticoagulation PTA. Pharmacy consulted to dose heparin for Afib.  Heparin level 0.23 is below goal. Patient was initially given heparin bolus 3500 units x1 and infusion 650 units/hr. CBC stable - Hgb slightly below baseline at 11.6 (BL~12-13), platelets low at 147 (BL ~160-80s). Patient has no s/sx of bleeding and no issues with infusion per RN.  Goal of Therapy:  Heparin level 0.3-0.7 units/ml Monitor platelets by anticoagulation protocol: Yes   Plan:  Give heparin bolus 1000 units x1 Increase heparin rate to 750 units/hr 8 hour heparin level Daily CBC, heparin level Monitor for s/sx of bleeding  Melanie Crazier, Student Pharmacist   Please check [email or web address] for unit-specific pharmacy phone numbers.

## 2021-01-19 NOTE — Discharge Instructions (Signed)

## 2021-01-21 LAB — CULTURE, BLOOD (ROUTINE X 2)
Culture: NO GROWTH
Culture: NO GROWTH
Special Requests: ADEQUATE
Special Requests: ADEQUATE

## 2021-01-24 ENCOUNTER — Other Ambulatory Visit: Payer: Self-pay | Admitting: *Deleted

## 2021-01-24 ENCOUNTER — Other Ambulatory Visit: Payer: Self-pay

## 2021-01-24 ENCOUNTER — Ambulatory Visit (INDEPENDENT_AMBULATORY_CARE_PROVIDER_SITE_OTHER): Payer: Medicare Other | Admitting: Family Medicine

## 2021-01-24 ENCOUNTER — Encounter: Payer: Self-pay | Admitting: Family Medicine

## 2021-01-24 VITALS — BP 118/68 | HR 74 | Temp 98.0°F | Ht 63.5 in | Wt 125.6 lb

## 2021-01-24 DIAGNOSIS — R059 Cough, unspecified: Secondary | ICD-10-CM

## 2021-01-24 DIAGNOSIS — J0100 Acute maxillary sinusitis, unspecified: Secondary | ICD-10-CM

## 2021-01-24 DIAGNOSIS — J329 Chronic sinusitis, unspecified: Secondary | ICD-10-CM | POA: Insufficient documentation

## 2021-01-24 MED ORDER — HYDROCOD POLST-CPM POLST ER 10-8 MG/5ML PO SUER: 5.0000 mL | Freq: Two times a day (BID) | ORAL | 0 refills | Status: DC | PRN

## 2021-01-24 NOTE — Progress Notes (Signed)
Provider:  Alain Honey, MD  Careteam: Patient Care Team: [name], NP as PCP - General (Geriatric Medicine) Lelon Perla, MD as PCP - Cardiology (Cardiology) [name], MD as Consulting Physician (Sports Medicine)  PLACE OF SERVICE:  [facility] Directive information    Allergies  Allergen Reactions   Compazine [Prochlorperazine Edisylate] Anxiety    Chief Complaint  Patient presents with   Acute Visit    Patient presents today for a follow-up from the hospital. He was tested positive for Covid and strep on 01/14/21 and was given antiviral. He also went into the hospital on 01/16/21 and was admitted until 01/18/21 for atrial fibrillation. He reports some sinus drainage and a cough.     HPI: Patient is a 85 y.o. male patient presents today with sinus pain pressure drainage and cough.  He was recently treated for COVID and strep but the strep was subsequently felt to be contaminated and blood culture.  Augmentin was started but then stopped with a second blood product culture showing negative.  He had been hospitalized during that time for chest pain with new onset A. fib.  He likely had non-STEMI infarct during that time.  New medicines included amiodarone carvedilol (with atenolol being discontinued) and Eliquis Review of Systems:  Review of Systems  Constitutional:  Positive for malaise/fatigue.  HENT:  Positive for congestion and sinus pain.   Respiratory:  Positive for cough.   Genitourinary: Negative.   Neurological: Negative.   All other systems reviewed and are negative.  Past Medical History:  Diagnosis Date   Acute gastric ulcer without mention of hemorrhage, perforation, or obstruction    Allergic rhinitis, cause unspecified    Anemia, unspecified    Arthritis    Asthma    Blood transfusion without reported diagnosis    CAD (coronary artery disease)    Diaphragmatic hernia without mention of obstruction or gangrene     Elevated prostate specific antigen (PSA)    Enlarged prostate    Esophageal reflux    Extrinsic asthma, unspecified    Herpes zoster without mention of complication    Hyperlipidemia    Hypersomnia with sleep apnea, unspecified    Impotence of organic origin    Kyphosis    Lumbago    Obstructive sleep apnea (adult) (pediatric)    Pneumonia    hx of years ago    Senile osteoporosis    Trigger finger (acquired)    Tubular adenoma of colon 05/2013   Unspecified essential hypertension    Unspecified sleep apnea    CPAP- settings 4-12    Unspecified vitamin D deficiency    Past Surgical History:  Procedure Laterality Date   CARDIAC CATHETERIZATION  03/04/2009   Dr [name]   COLONOSCOPY     CORONARY ARTERY BYPASS GRAFT  2010   CYSTOSCOPY WITH RETROGRADE PYELOGRAM, URETEROSCOPY AND STENT PLACEMENT Right 10/04/2013   Procedure: CYSTOSCOPY WITH RETROGRADE PYELOGRAM,  AND STENT PLACEMENT;  Surgeon: Festus Aloe, MD;  Location: [facility];  Service: Urology;  Laterality: Right;   DENTAL SURGERY N/A    MOLE REMOVAL  1958   chin   ROTATOR CUFF REPAIR Left 04/1998   with bone spur removed, Dr French Ana   TONSILLECTOMY  1945   TRANSURETHRAL RESECTION OF PROSTATE N/A 09/24/2013   Procedure: TRANSURETHRAL RESECTION OF THE PROSTATE (TURP) WITH GYRUS (STAGED RIGHT LATERAL AND MEDIAN LOBE);  Surgeon: Ailene Rud, MD;  Location: [facility];  Service: Urology;  Laterality: N/A;   UPPER GI ENDOSCOPY     Social History:   reports that he has never smoked. He has never used smokeless tobacco. He reports that he does not drink alcohol and does not use drugs.  Family History  Problem Relation Age of Onset   Stroke Mother    Breast cancer Mother    Cirrhosis Mother        wine   Rheumatic fever Mother    Kyphosis Mother    Hypertension Father    Stroke Father    Heart disease Father    Heart disease Paternal Grandfather    Colon cancer Neg Hx     Medications: Patient's  Medications  New Prescriptions   CHLORPHENIRAMINE-HYDROCODONE (TUSSIONEX PENNKINETIC ER) 10-8 MG/5ML SUER    Take 5 mLs by mouth every 12 (twelve) hours as needed for cough.  Previous Medications   ACETAMINOPHEN (TYLENOL) 500 MG TABLET    Take 1,000 mg by mouth every 6 (six) hours as needed for mild pain or moderate pain.   ALBUTEROL (VENTOLIN HFA) 108 (90 BASE) MCG/ACT INHALER    USE 1 TO 2 INHALATIONS BY  MOUTH EVERY 6 HOURS AS  NEEDED FOR WHEEZING OR  SHORTNESS OF BREATH   AMIODARONE (PACERONE) 200 MG TABLET    Take 1 tablet (200 mg total) by mouth daily.   AMIODARONE (PACERONE) 400 MG TABLET    Take 1 tablet (400 mg total) by mouth 2 (two) times daily.   APIXABAN (ELIQUIS) 2.5 MG TABS TABLET    Take 1 tablet (2.5 mg total) by mouth 2 (two) times daily.   ASPIRIN EC 81 MG TABLET    Take 1 tablet (81 mg total) by mouth daily.   ASPIRIN-ACETAMINOPHEN-CAFFEINE (GOODYS EXTRA STRENGTH) 500-325-65 MG PACK    Take 1 Package by mouth 2 (two) times daily as needed (pain/headache).   ASPIRIN-SALICYLAMIDE-CAFFEINE (BC HEADACHE POWDER PO)    Take 1 Package by mouth 2 (two) times daily as needed (headache/pain).   B COMPLEX VITAMINS TABLET    Take 1 tablet by mouth daily.   CALCIUM CITRATE-VITAMIN D (EQ CALCIUM CITRATE+D3 PO)    Take 1,200 mg by mouth daily.   CARVEDILOL (COREG) 3.125 MG TABLET    Take 1 tablet (3.125 mg total) by mouth 2 (two) times daily with a meal.   CHOLECALCIFEROL (VITAMIN D-3) 5000 UNITS TABS    Take 10,000 Units by mouth daily.   COENZYME Q10 (COQ10) 200 MG CAPS    Take 200 mg by mouth daily.   DOXAZOSIN (CARDURA) 4 MG TABLET    TAKE 1 TABLET BY MOUTH  DAILY TO HELP CONTROL BLOOD PRESSURE   FAMOTIDINE (PEPCID) 20 MG TABLET    Take 1 tablet (20 mg total) by mouth daily.   FEXOFENADINE (ALLEGRA) 180 MG TABLET    Take 180 mg by mouth daily as needed for allergies.   FINASTERIDE (PROSCAR) 5 MG TABLET    TAKE 1 TABLET BY MOUTH  EVERY MORNING   FOLIC ACID (FOLVITE) 1 MG TABLET    TAKE 1  TABLET BY MOUTH  DAILY   LISINOPRIL (ZESTRIL) 5 MG TABLET    Take 1 tablet (5 mg total) by mouth daily.   MULTIPLE VITAMIN (MULTIVITAMIN) TABLET    Take 1 tablet by mouth daily.   ONDANSETRON (ZOFRAN) 4 MG TABLET    Take 1 tablet (4 mg total) by mouth every 6 (six) hours.   PANTOPRAZOLE (PROTONIX) 40 MG TABLET    Take 1 tablet (  40 mg total) by mouth daily.   ROSUVASTATIN (CRESTOR) 20 MG TABLET    TAKE 1 TABLET BY MOUTH ONCE DAILY FOR CHOLESTEROL  Modified Medications   No medications on file  Discontinued Medications   No medications on file    Physical Exam:  Vitals:   01/24/21 0938  BP: 118/68  Pulse: 74  Temp: 98 F (36.7 C)  TempSrc: Temporal  SpO2: 98%  Weight: 125 lb 9.6 oz (57 kg)  Height: 5' 3.5" (1.613 m)   Body mass index is 21.9 kg/m. Wt Readings from Last 3 Encounters:  01/24/21 125 lb 9.6 oz (57 kg)  01/18/21 125 lb (56.7 kg)  01/16/21 126 lb (57.2 kg)    Physical Exam Vitals and nursing note reviewed.  Constitutional:      Appearance: Normal appearance.  HENT:     Head: Normocephalic.     Comments: Tender over maxillary sinuses to percussion    Right Ear: Tympanic membrane normal.     Left Ear: Tympanic membrane normal.  Cardiovascular:     Rate and Rhythm: Normal rate. Rhythm irregular.  Pulmonary:     Effort: Pulmonary effort is normal.     Breath sounds: Normal breath sounds.  Neurological:     Mental Status: He is alert.    Labs reviewed: Basic Metabolic Panel: Recent Labs    01/16/21 0847 01/18/21 1310 01/19/21 0040  NA 135 135 138  K 3.5 3.5 3.4*  CL 104 103 105  CO2 20* 24 26  GLUCOSE 117* 128* 112*  BUN 23 26* 22  CREATININE 0.95 1.40* 1.15  CALCIUM 8.7* 8.7* 8.4*  MG  --   --  1.6*  TSH  --   --  1.253   Liver Function Tests: Recent Labs    10/31/20 1549 01/14/21 1256 01/16/21 0847  AST 19 26 55*  ALT 13 20 34  ALKPHOS  --  44 45  BILITOT 1.6* 1.6* 1.7*  PROT 6.4 6.8 6.7  ALBUMIN  --  3.7 3.7   No results for  input(s): LIPASE, AMYLASE in the last 8760 hours. No results for input(s): AMMONIA in the last 8760 hours. CBC: Recent Labs    01/14/21 1256 01/16/21 0847 01/18/21 1310 01/19/21 0040  WBC 4.7 6.2 6.4 6.0  NEUTROABS 3.5 4.9 4.7  --   HGB 11.3* 11.5* 11.8* 11.6*  HCT 34.6* 34.8* 35.0* 34.0*  MCV 90.6 90.6 87.9 89.2  PLT 134* 131* 149* 147*   Lipid Panel: Recent Labs    04/22/20 1026  CHOL 113  HDL 58  LDLCALC 41  TRIG 61  CHOLHDL 1.9   TSH: Recent Labs    01/19/21 0040  TSH 1.253   A1C: Lab Results  Component Value Date   HGBA1C 5.6 07/28/2012     Assessment/Plan  1. Cough Go ahead and finish Augmentin which she had from previously infection thought to be strep.  Stop Allegra which might thicken drainage but use Mucinex DM in its place - chlorpheniramine-HYDROcodone (TUSSIONEX PENNKINETIC ER) 10-8 MG/5ML SUER; Take 5 mLs by mouth every 12 (twelve) hours as needed for cough.  Dispense: 115 mL; Refill: 0  2. Acute maxillary sinusitis, recurrence not specified See above for discussion of sinusitis treatment   Alain Honey, MD [facility] [phone]

## 2021-01-24 NOTE — Patient Instructions (Signed)
Take Augmentin as before Take Tussionex at bedtime Hold Allegra

## 2021-01-24 NOTE — Telephone Encounter (Signed)
Patient called and stated that the Rx you sent for Tussionex, [facility] does not have.  Patient is wanting the Rx to be sent to [facility] instead.   Pended Rx and sent to Dr. Sabra Heck for approval.

## 2021-01-27 ENCOUNTER — Emergency Department (HOSPITAL_BASED_OUTPATIENT_CLINIC_OR_DEPARTMENT_OTHER): Payer: Medicare Other

## 2021-01-27 ENCOUNTER — Encounter (HOSPITAL_BASED_OUTPATIENT_CLINIC_OR_DEPARTMENT_OTHER): Payer: Self-pay | Admitting: *Deleted

## 2021-01-27 ENCOUNTER — Other Ambulatory Visit: Payer: Self-pay

## 2021-01-27 ENCOUNTER — Emergency Department (HOSPITAL_BASED_OUTPATIENT_CLINIC_OR_DEPARTMENT_OTHER)
Admission: EM | Admit: 2021-01-27 | Discharge: 2021-01-28 | Disposition: A | Discharge status: Home / Self Care | Payer: Medicare Other | Attending: Emergency Medicine | Admitting: Emergency Medicine

## 2021-01-27 DIAGNOSIS — Z7982 Long term (current) use of aspirin: Secondary | ICD-10-CM | POA: Insufficient documentation

## 2021-01-27 DIAGNOSIS — R059 Cough, unspecified: Secondary | ICD-10-CM | POA: Diagnosis not present

## 2021-01-27 DIAGNOSIS — R944 Abnormal results of kidney function studies: Secondary | ICD-10-CM | POA: Insufficient documentation

## 2021-01-27 DIAGNOSIS — R0981 Nasal congestion: Secondary | ICD-10-CM | POA: Insufficient documentation

## 2021-01-27 DIAGNOSIS — Z79899 Other long term (current) drug therapy: Secondary | ICD-10-CM | POA: Insufficient documentation

## 2021-01-27 DIAGNOSIS — I10 Essential (primary) hypertension: Secondary | ICD-10-CM | POA: Diagnosis not present

## 2021-01-27 DIAGNOSIS — N179 Acute kidney failure, unspecified: Secondary | ICD-10-CM | POA: Insufficient documentation

## 2021-01-27 DIAGNOSIS — R0602 Shortness of breath: Secondary | ICD-10-CM | POA: Diagnosis not present

## 2021-01-27 DIAGNOSIS — J45909 Unspecified asthma, uncomplicated: Secondary | ICD-10-CM | POA: Insufficient documentation

## 2021-01-27 DIAGNOSIS — I2510 Atherosclerotic heart disease of native coronary artery without angina pectoris: Secondary | ICD-10-CM | POA: Insufficient documentation

## 2021-01-27 DIAGNOSIS — U071 COVID-19: Secondary | ICD-10-CM | POA: Diagnosis not present

## 2021-01-27 LAB — CBC
HCT: 31.3 % — ABNORMAL LOW (ref 39.0–52.0)
Hemoglobin: 10.8 g/dL — ABNORMAL LOW (ref 13.0–17.0)
MCH: 30 pg (ref 26.0–34.0)
MCHC: 34.5 g/dL (ref 30.0–36.0)
MCV: 86.9 fL (ref 80.0–100.0)
Platelets: 239 10*3/uL (ref 150–400)
RBC: 3.6 MIL/uL — ABNORMAL LOW (ref 4.22–5.81)
RDW: 12.7 % (ref 11.5–15.5)
WBC: 7 10*3/uL (ref 4.0–10.5)
nRBC: 0 % (ref 0.0–0.2)

## 2021-01-27 LAB — BASIC METABOLIC PANEL
Anion gap: 9 (ref 5–15)
BUN: 25 mg/dL — ABNORMAL HIGH (ref 8–23)
CO2: 22 mmol/L (ref 22–32)
Calcium: 8.5 mg/dL — ABNORMAL LOW (ref 8.9–10.3)
Chloride: 102 mmol/L (ref 98–111)
Creatinine, Ser: 1.36 mg/dL — ABNORMAL HIGH (ref 0.61–1.24)
GFR, Estimated: 52 mL/min — ABNORMAL LOW (ref >60)
Glucose, Bld: 127 mg/dL — ABNORMAL HIGH (ref 70–99)
Potassium: 4.1 mmol/L (ref 3.5–5.1)
Sodium: 133 mmol/L — ABNORMAL LOW (ref 135–145)

## 2021-01-27 MED ORDER — LIDOCAINE HCL 2 % IJ SOLN
5.0000 mL | Freq: Once | INTRAMUSCULAR | Status: AC
  Administered 2021-01-27: 100 mg
  Filled 2021-01-27: qty 20

## 2021-01-27 MED ORDER — IPRATROPIUM-ALBUTEROL 0.5-2.5 (3) MG/3ML IN SOLN
3.0000 mL | Freq: Once | RESPIRATORY_TRACT | Status: AC
  Administered 2021-01-27: 3 mL via RESPIRATORY_TRACT
  Filled 2021-01-27: qty 3

## 2021-01-27 NOTE — ED Provider Notes (Signed)
[facility] Provider Note   CSN: [id number] Arrival date & time: 01/27/21  2057     History Chief Complaint  Patient presents with   Covid Positive    X 13 days    Brent Griffith is a 85 y.o. male.  HPI Patient presents with cough and some throat tightness.  Recently was in the hospital for new onset A. fib and COVID infection.  Also questionably positive blood culture with strep.  Discharge about a week ago.  Still in sinus rhythm.  Has had a cough.  States throat will feel tight.  Mild sputum production.  PCP has adjusted his antitussives with really no relief.  Patient's wife states it was keeping him up at night.  History of asthma.  Reviewing history also may have vocal cord spasm.    Past Medical History:  Diagnosis Date   Acute gastric ulcer without mention of hemorrhage, perforation, or obstruction    Allergic rhinitis, cause unspecified    Anemia, unspecified    Arthritis    Asthma    Blood transfusion without reported diagnosis    CAD (coronary artery disease)    Diaphragmatic hernia without mention of obstruction or gangrene    Elevated prostate specific antigen (PSA)    Enlarged prostate    Esophageal reflux    Extrinsic asthma, unspecified    Herpes zoster without mention of complication    Hyperlipidemia    Hypersomnia with sleep apnea, unspecified    Impotence of organic origin    Kyphosis    Lumbago    Obstructive sleep apnea (adult) (pediatric)    Pneumonia    hx of years ago    Senile osteoporosis    Trigger finger (acquired)    Tubular adenoma of colon 05/2013   Unspecified essential hypertension    Unspecified sleep apnea    CPAP- settings 4-12    Unspecified vitamin D deficiency     Patient Active Problem List   Diagnosis Date Noted   Sinusitis 01/24/2021   New onset atrial fibrillation ([place]) 01/19/2021   AKI (acute kidney injury) (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   Dyslipidemia 04/06/2009   CARDIOVASCULAR FUNCTION STUDY, ABNORMAL 03/03/2009   Obstructive sleep apnea 02/04/2009   Essential hypertension 09/02/2008   G E R D 09/02/2008   PERIODIC LIMB MOVEMENT DISORDER 09/03/2007    Past Surgical History:  Procedure Laterality Date   CARDIAC CATHETERIZATION  03/04/2009   Dr [name]   COLONOSCOPY     CORONARY ARTERY BYPASS GRAFT  2010   CYSTOSCOPY WITH RETROGRADE PYELOGRAM, URETEROSCOPY AND STENT PLACEMENT Right 10/04/2013   Procedure: CYSTOSCOPY WITH RETROGRADE PYELOGRAM,  AND STENT PLACEMENT;  Surgeon: Festus Aloe, MD;  Location: [facility];  Service: Urology;  Laterality: Right;   DENTAL SURGERY N/A    MOLE REMOVAL  1958   chin   ROTATOR CUFF REPAIR Left 04/1998   with bone spur removed, Dr French Ana   TONSILLECTOMY  1945   TRANSURETHRAL RESECTION OF PROSTATE N/A 09/24/2013   Procedure: TRANSURETHRAL RESECTION OF THE PROSTATE (TURP) WITH GYRUS (STAGED RIGHT  LATERAL AND MEDIAN LOBE);  Surgeon: Ailene Rud, MD;  Location: [facility];  Service: Urology;  Laterality: N/A;   UPPER GI ENDOSCOPY         Family History  Problem Relation Age of Onset   Stroke Mother    Breast cancer Mother    Cirrhosis Mother        wine   Rheumatic fever Mother    Kyphosis Mother    Hypertension Father    Stroke Father    Heart disease Father    Heart disease Paternal Grandfather    Colon cancer Neg Hx     Social History   Tobacco Use   Smoking status: Never   Smokeless tobacco: Never  Vaping Use   Vaping Use: Never used  Substance Use Topics   Alcohol  use: No   Drug use: No    Home Medications Prior to Admission medications   Medication Sig Start Date End Date Taking? Authorizing Provider  acetaminophen (TYLENOL) 500 MG tablet Take 1,000 mg by mouth every 6 (six) hours as needed for mild pain or moderate pain.    [provider]  albuterol (VENTOLIN HFA) 108 (90 Base) MCG/ACT inhaler USE 1 TO 2 INHALATIONS BY  MOUTH EVERY 6 HOURS AS  NEEDED FOR WHEEZING OR  SHORTNESS OF BREATH Patient taking differently: Inhale 1-2 puffs into the lungs every 6 (six) hours as needed for shortness of breath or wheezing. 03/03/20   [name], NP  amiodarone (PACERONE) 200 MG tablet Take 1 tablet (200 mg total) by mouth daily. 01/26/21   Debbe Odea, MD  amiodarone (PACERONE) 400 MG tablet Take 1 tablet (400 mg total) by mouth 2 (two) times daily. 01/19/21   Debbe Odea, MD  apixaban (ELIQUIS) 2.5 MG TABS tablet Take 1 tablet (2.5 mg total) by mouth 2 (two) times daily. 01/19/21   Debbe Odea, MD  aspirin EC 81 MG tablet Take 1 tablet (81 mg total) by mouth daily. 03/05/18   Lelon Perla, MD  Aspirin-Acetaminophen-Caffeine (GOODYS EXTRA STRENGTH) [phone] MG PACK Take 1 Package by mouth 2 (two) times daily as needed (pain/headache).    [provider]  Aspirin-Salicylamide-Caffeine (BC HEADACHE POWDER PO) Take 1 Package by mouth 2 (two) times daily as needed (headache/pain).    [provider]  b complex vitamins tablet Take 1 tablet by mouth daily.    [provider]  Calcium Citrate-Vitamin D (EQ CALCIUM CITRATE+D3 PO) Take 1,200 mg by mouth daily.    [provider]  carvedilol (COREG) 3.125 MG tablet Take 1 tablet (3.125 mg total) by mouth 2 (two) times daily with a meal. 01/19/21   Debbe Odea, MD  chlorpheniramine-HYDROcodone (TUSSIONEX PENNKINETIC ER) 10-8 MG/5ML SUER Take 5 mLs by mouth every 12 (twelve) hours as needed for cough. 01/24/21   Wardell Honour, MD  Cholecalciferol (VITAMIN D-3)  5000 units TABS Take 10,000 Units by mouth daily.    [provider]  Coenzyme Q10 (COQ10) 200 MG CAPS Take 200 mg by mouth daily.    [provider]  doxazosin (CARDURA) 4 MG tablet TAKE 1 TABLET BY MOUTH  DAILY TO HELP CONTROL BLOOD PRESSURE Patient taking differently: Take 4 mg by mouth daily. 10/31/20   [name], NP  famotidine (PEPCID) 20 MG tablet Take 1 tablet (20 mg total) by mouth daily. 10/31/20   [name], NP  fexofenadine (ALLEGRA) 180 MG tablet Take 180 mg by mouth daily as needed for allergies.  [provider]  finasteride (PROSCAR) 5 MG tablet TAKE 1 TABLET BY MOUTH  EVERY MORNING Patient taking differently: Take 5 mg by mouth every morning. 11/28/20   [name], NP  folic acid (FOLVITE) 1 MG tablet TAKE 1 TABLET BY MOUTH  DAILY Patient taking differently: Take 1 mg by mouth daily. 03/21/20   [name], NP  lisinopril (ZESTRIL) 5 MG tablet Take 1 tablet (5 mg total) by mouth daily. 01/19/21   Debbe Odea, MD  Multiple Vitamin (MULTIVITAMIN) tablet Take 1 tablet by mouth daily.    [provider]  ondansetron (ZOFRAN) 4 MG tablet Take 1 tablet (4 mg total) by mouth every 6 (six) hours. 01/16/21   Valarie Merino, MD  pantoprazole (PROTONIX) 40 MG tablet Take 1 tablet (40 mg total) by mouth daily. 10/31/20   [name], NP  rosuvastatin (CRESTOR) 20 MG tablet TAKE 1 TABLET BY MOUTH ONCE DAILY FOR CHOLESTEROL Patient taking differently: Take 20 mg by mouth daily. 03/21/20   [name], NP    Allergies    Compazine [prochlorperazine edisylate]  Review of Systems   Review of Systems  Constitutional:  Positive for appetite change.  HENT:  Positive for congestion.   Respiratory:  Positive for cough.   Cardiovascular:  Negative for leg swelling.  Gastrointestinal:  Negative for abdominal pain.  Genitourinary:  Negative for flank pain.  Musculoskeletal:  Negative for back pain.  Skin:   Negative for rash.  Neurological:  Negative for weakness.  Psychiatric/Behavioral:  Negative for confusion.    Physical Exam Updated Vital Signs BP 123/61   Pulse 73   Temp 98.2 F (36.8 C) (Oral)   Resp 14   Ht 5' 3.5" (1.613 m)   Wt 55.3 kg   SpO2 99%   BMI 21.27 kg/m   Physical Exam Vitals and nursing note reviewed.  HENT:     Head: Atraumatic.     Nose: Congestion present.     Mouth/Throat:     Pharynx: Posterior oropharyngeal erythema present. No oropharyngeal exudate.     Comments: Some posterior cobblestoning.  Mild erythema. Cardiovascular:     Rate and Rhythm: Normal rate and regular rhythm.  Pulmonary:     Breath sounds: No wheezing or rhonchi.  Abdominal:     Tenderness: There is no abdominal tenderness.  Musculoskeletal:        General: No tenderness.     Cervical back: Neck supple.     Right lower leg: No edema.     Left lower leg: No edema.  Skin:    General: Skin is warm.     Capillary Refill: Capillary refill takes less than 2 seconds.  Neurological:     Mental Status: He is alert and oriented to person, place, and time.    ED Results / Procedures / Treatments   Labs (all labs ordered are listed, but only abnormal results are displayed) Labs Reviewed  BASIC METABOLIC PANEL - Abnormal; Notable for the following components:      Result Value   Sodium 133 (*)    Glucose, Bld 127 (*)    BUN 25 (*)    Creatinine, Ser 1.36 (*)    Calcium 8.5 (*)    GFR, Estimated 52 (*)    All other components within normal limits  CBC - Abnormal; Notable for the following components:   RBC 3.60 (*)    Hemoglobin 10.8 (*)    HCT 31.3 (*)  All other components within normal limits    EKG EKG Interpretation  Date/Time:  Friday January 27 2021 22:18:27 EDT Ventricular Rate:  81 PR Interval:  162 QRS Duration: 126 QT Interval:  403 QTC Calculation: 468 R Axis:   3 Text Interpretation: Sinus rhythm Ventricular premature complex Left bundle branch block  Confirmed by Davonna Belling [phone]) on 01/27/2021 10:26:51 PM  Radiology DG Chest Portable 1 View  Result Date: 01/27/2021 CLINICAL DATA:  COVID positive with shortness of breath and cough. EXAM: PORTABLE CHEST 1 VIEW COMPARISON:  January 18, 2021 FINDINGS: Multiple sternal wires and vascular clips are seen. There is no evidence of acute infiltrate, pleural effusion or pneumothorax. The heart size and mediastinal contours are within normal limits. The visualized skeletal structures are unremarkable. IMPRESSION: Evidence of prior median sternotomy/CABG without acute cardiopulmonary disease. Electronically Signed   By: Virgina Norfolk M.D.   On: 01/27/2021 22:25    Procedures Procedures   Medications Ordered in ED Medications  ipratropium-albuterol (DUONEB) 0.5-2.5 (3) MG/3ML nebulizer solution 3 mL (has no administration in time range)  lidocaine (XYLOCAINE) 2 % (with pres) injection 100 mg (100 mg Other Given 01/27/21 2323)    ED Course  I have reviewed the triage vital signs and the nursing notes.  Pertinent labs & imaging results that were available during my care of the patient were reviewed by me and considered in my medical decision making (see chart for details).    MDM Rules/Calculators/A&P                           Patient presents with cough.  Some shortness of breath.  Recent admission to the hospital for [place].  Discharged around a week ago.  It is 13 days post positive test.  States he has a history of asthma and what sounds like vocal cord spasm.  Is been coughing.  Not hypoxic however.  Chest x-ray reassuring.  No stridor.  Has had some cough medicine at home.  Has mild recurrent increase in his creatinine.  Mildly elevated while in the hospital.  Reportedly has been eating and drinking less.  Will need to follow-up with PCP.  Will nebulize lidocaine to see if it helps.  Throat feels tight after the lidocaine.  We will now give breathing treatment which patient said  worked in the past.  I think it is worth the risk even with history of recent A. fib.  Hopefully still able to discharge home    Final Clinical Impression(s) / ED Diagnoses Final diagnoses:  Cough  AKI (acute kidney injury) [facility])    Rx / [place] Orders ED Discharge Orders     None        Davonna Belling, MD 01/27/21 2359

## 2021-01-27 NOTE — ED Triage Notes (Signed)
Covid + , with cough and SOB x 1 day , RT to triage , chest xray ordered

## 2021-01-28 ENCOUNTER — Encounter (HOSPITAL_BASED_OUTPATIENT_CLINIC_OR_DEPARTMENT_OTHER): Payer: Self-pay | Admitting: Emergency Medicine

## 2021-01-28 MED ORDER — ALUM & MAG HYDROXIDE-SIMETH 200-200-20 MG/5ML PO SUSP
30.0000 mL | Freq: Once | ORAL | Status: AC
  Administered 2021-01-28: 30 mL via ORAL
  Filled 2021-01-28: qty 30

## 2021-01-28 MED ORDER — DEXAMETHASONE SODIUM PHOSPHATE 4 MG/ML IJ SOLN
4.0000 mg | Freq: Once | INTRAMUSCULAR | Status: AC
  Administered 2021-01-28: 4 mg via INTRAMUSCULAR
  Filled 2021-01-28: qty 1

## 2021-01-28 MED ORDER — ALBUTEROL SULFATE HFA 108 (90 BASE) MCG/ACT IN AERS
2.0000 | INHALATION_SPRAY | Freq: Once | RESPIRATORY_TRACT | Status: AC
  Administered 2021-01-28: 2 via RESPIRATORY_TRACT
  Filled 2021-01-28: qty 6.7

## 2021-01-28 NOTE — Progress Notes (Signed)
RN gave patient nebulizer treatment.

## 2021-01-30 ENCOUNTER — Other Ambulatory Visit: Payer: Self-pay

## 2021-01-30 ENCOUNTER — Emergency Department (HOSPITAL_BASED_OUTPATIENT_CLINIC_OR_DEPARTMENT_OTHER)
Admission: EM | Admit: 2021-01-30 | Discharge: 2021-01-30 | Disposition: A | Discharge status: Home / Self Care | Payer: Medicare Other | Attending: Emergency Medicine | Admitting: Emergency Medicine

## 2021-01-30 ENCOUNTER — Emergency Department (HOSPITAL_BASED_OUTPATIENT_CLINIC_OR_DEPARTMENT_OTHER): Payer: Medicare Other

## 2021-01-30 ENCOUNTER — Encounter (HOSPITAL_BASED_OUTPATIENT_CLINIC_OR_DEPARTMENT_OTHER): Payer: Self-pay

## 2021-01-30 ENCOUNTER — Ambulatory Visit: Payer: Self-pay

## 2021-01-30 DIAGNOSIS — I2510 Atherosclerotic heart disease of native coronary artery without angina pectoris: Secondary | ICD-10-CM | POA: Diagnosis not present

## 2021-01-30 DIAGNOSIS — Z951 Presence of aortocoronary bypass graft: Secondary | ICD-10-CM | POA: Insufficient documentation

## 2021-01-30 DIAGNOSIS — J45909 Unspecified asthma, uncomplicated: Secondary | ICD-10-CM | POA: Diagnosis not present

## 2021-01-30 DIAGNOSIS — U071 COVID-19: Secondary | ICD-10-CM | POA: Insufficient documentation

## 2021-01-30 DIAGNOSIS — R0602 Shortness of breath: Secondary | ICD-10-CM | POA: Diagnosis not present

## 2021-01-30 DIAGNOSIS — R9431 Abnormal electrocardiogram [ECG] [EKG]: Secondary | ICD-10-CM | POA: Diagnosis not present

## 2021-01-30 DIAGNOSIS — Z7901 Long term (current) use of anticoagulants: Secondary | ICD-10-CM | POA: Insufficient documentation

## 2021-01-30 DIAGNOSIS — I509 Heart failure, unspecified: Secondary | ICD-10-CM | POA: Insufficient documentation

## 2021-01-30 DIAGNOSIS — I110 Hypertensive heart disease with heart failure: Secondary | ICD-10-CM | POA: Diagnosis not present

## 2021-01-30 DIAGNOSIS — J385 Laryngeal spasm: Secondary | ICD-10-CM | POA: Diagnosis not present

## 2021-01-30 DIAGNOSIS — Z79899 Other long term (current) drug therapy: Secondary | ICD-10-CM | POA: Diagnosis not present

## 2021-01-30 DIAGNOSIS — Z7982 Long term (current) use of aspirin: Secondary | ICD-10-CM | POA: Diagnosis not present

## 2021-01-30 DIAGNOSIS — J392 Other diseases of pharynx: Secondary | ICD-10-CM | POA: Insufficient documentation

## 2021-01-30 LAB — CBC WITH DIFFERENTIAL/PLATELET
Abs Immature Granulocytes: 0.06 10*3/uL (ref 0.00–0.07)
Basophils Absolute: 0 10*3/uL (ref 0.0–0.1)
Basophils Relative: 0 %
Eosinophils Absolute: 0.2 10*3/uL (ref 0.0–0.5)
Eosinophils Relative: 2 %
HCT: 35.3 % — ABNORMAL LOW (ref 39.0–52.0)
Hemoglobin: 12 g/dL — ABNORMAL LOW (ref 13.0–17.0)
Immature Granulocytes: 1 %
Lymphocytes Relative: 21 %
Lymphs Abs: 2.1 10*3/uL (ref 0.7–4.0)
MCH: 29.9 pg (ref 26.0–34.0)
MCHC: 34 g/dL (ref 30.0–36.0)
MCV: 88 fL (ref 80.0–100.0)
Monocytes Absolute: 1.1 10*3/uL — ABNORMAL HIGH (ref 0.1–1.0)
Monocytes Relative: 11 %
Neutro Abs: 6.5 10*3/uL (ref 1.7–7.7)
Neutrophils Relative %: 65 %
Platelets: 415 10*3/uL — ABNORMAL HIGH (ref 150–400)
RBC: 4.01 MIL/uL — ABNORMAL LOW (ref 4.22–5.81)
RDW: 12.7 % (ref 11.5–15.5)
WBC: 9.9 10*3/uL (ref 4.0–10.5)
nRBC: 0 % (ref 0.0–0.2)

## 2021-01-30 LAB — COMPREHENSIVE METABOLIC PANEL
ALT: 21 U/L (ref 0–44)
AST: 23 U/L (ref 15–41)
Albumin: 3.7 g/dL (ref 3.5–5.0)
Alkaline Phosphatase: 41 U/L (ref 38–126)
Anion gap: 8 (ref 5–15)
BUN: 23 mg/dL (ref 8–23)
CO2: 24 mmol/L (ref 22–32)
Calcium: 9 mg/dL (ref 8.9–10.3)
Chloride: 99 mmol/L (ref 98–111)
Creatinine, Ser: 1.19 mg/dL (ref 0.61–1.24)
GFR, Estimated: >60 mL/min (ref >60)
Glucose, Bld: 112 mg/dL — ABNORMAL HIGH (ref 70–99)
Potassium: 4.3 mmol/L (ref 3.5–5.1)
Sodium: 131 mmol/L — ABNORMAL LOW (ref 135–145)
Total Bilirubin: 0.9 mg/dL (ref 0.3–1.2)
Total Protein: 6.6 g/dL (ref 6.5–8.1)

## 2021-01-30 MED ORDER — PREDNISONE 10 MG PO TABS
40.0000 mg | ORAL_TABLET | Freq: Every day | ORAL | 0 refills | Status: DC
Start: 2021-01-30 — End: 2021-02-20

## 2021-01-30 MED ORDER — PREDNISONE 50 MG PO TABS
60.0000 mg | ORAL_TABLET | Freq: Once | ORAL | Status: AC
  Administered 2021-01-30: 60 mg via ORAL
  Filled 2021-01-30: qty 1

## 2021-01-30 NOTE — ED Notes (Signed)
Pt given urinal and encouraged to call for assistance

## 2021-01-30 NOTE — Discharge Instructions (Signed)
Chest x-ray clear here today oxygen levels very good.  Continue use your albuterol inhaler 2 puffs every 6 hours.  Take the prednisone as directed for the next 5 days.  Return for any new or worse symptoms.

## 2021-01-30 NOTE — ED Provider Notes (Signed)
[facility] Provider Note   CSN: [id number] Arrival date & time: 01/30/21  1809     History Chief Complaint  Patient presents with   Shortness of Breath    Brent Griffith is a 85 y.o. male.  Patient presents with concerns for shortness of breath and increased work of breathing.  Denies any wheezing.  Really when you talk to him patient gets these like spasms feels like it is in the lower part of his throat and that he has some difficulty breathing and its associated with coughing.  Patient was COVID-positive on the 10th.  Patient was admitted 63 for a non-STEMI myocardial infarction.  Patient last seen here on September 23 for cough and was stable for discharge home.  Patient does have albuterol inhaler with a chamber to use at home.  Patient has not been on steroids.  Patient arrives here with oxygen saturations on room air 100%.  Not tachycardic temp is 98.1.  Patient also had the new onset of atrial fibrillation probably related to the [place].  He is on Pacerone for that and his heart rate here has several PVCs but is not atrial fib.  In addition patient has had some weight loss since having the COVID.  Having a loose bowel movement once a day.  No frequent episodes of diarrhea.      Past Medical History:  Diagnosis Date   Acute gastric ulcer without mention of hemorrhage, perforation, or obstruction    Allergic rhinitis, cause unspecified    Anemia, unspecified    Arthritis    Asthma    Blood transfusion without reported diagnosis    CAD (coronary artery disease)    Diaphragmatic hernia without mention of obstruction or gangrene    Elevated prostate specific antigen (PSA)    Enlarged prostate    Esophageal reflux    Extrinsic asthma, unspecified    Herpes zoster without mention of complication    Hyperlipidemia    Hypersomnia with sleep apnea, unspecified    Impotence of organic origin    Kyphosis    Lumbago    Obstructive sleep apnea  (adult) (pediatric)    Pneumonia    hx of years ago    Senile osteoporosis    Trigger finger (acquired)    Tubular adenoma of colon 05/2013   Unspecified essential hypertension    Unspecified sleep apnea    CPAP- settings 4-12    Unspecified vitamin D deficiency     Patient Active Problem List   Diagnosis Date Noted   Sinusitis 01/24/2021   New onset atrial fibrillation ([place]) 01/19/2021   AKI (acute kidney injury) ([place]) 01/19/2021   HFrEF (heart failure with reduced ejection fraction) ([place]) 01/19/2021   CAD (coronary artery disease) 10/11/2017   IDA (iron deficiency anemia) 08/31/2016   Seasonal and perennial allergic rhinitis 08/28/2016   Palpitations 09/07/2015   Cough, persistent 07/20/2015   Senile osteoporosis 06/09/2014   Shortness of breath 03/20/2014   Hydronephrosis of right kidney 10/03/2013   Benign prostatic hyperplasia 09/24/2013   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   Dyslipidemia 04/06/2009   CARDIOVASCULAR FUNCTION STUDY, ABNORMAL 03/03/2009   Obstructive sleep apnea 02/04/2009   Essential hypertension 09/02/2008   G E R D 09/02/2008   PERIODIC LIMB MOVEMENT DISORDER 09/03/2007    Past Surgical History:  Procedure Laterality Date   CARDIAC CATHETERIZATION  03/04/2009   Dr [name]   COLONOSCOPY     CORONARY ARTERY BYPASS GRAFT  2010   CYSTOSCOPY  WITH RETROGRADE PYELOGRAM, URETEROSCOPY AND STENT PLACEMENT Right 10/04/2013   Procedure: CYSTOSCOPY WITH RETROGRADE PYELOGRAM,  AND STENT PLACEMENT;  Surgeon: Festus Aloe, MD;  Location: [facility];  Service: Urology;  Laterality: Right;   DENTAL SURGERY N/A    MOLE REMOVAL  1958   chin   ROTATOR CUFF REPAIR Left 04/1998   with bone spur removed, Dr French Ana   TONSILLECTOMY  1945   TRANSURETHRAL RESECTION OF PROSTATE N/A 09/24/2013   Procedure: TRANSURETHRAL RESECTION OF THE PROSTATE (TURP) WITH GYRUS (STAGED RIGHT LATERAL AND MEDIAN LOBE);  Surgeon: Ailene Rud, MD;  Location: [facility];  Service: Urology;   Laterality: N/A;   UPPER GI ENDOSCOPY         Family History  Problem Relation Age of Onset   Stroke Mother    Breast cancer Mother    Cirrhosis Mother        wine   Rheumatic fever Mother    Kyphosis Mother    Hypertension Father    Stroke Father    Heart disease Father    Heart disease Paternal Grandfather    Colon cancer Neg Hx     Social History   Tobacco Use   Smoking status: Never   Smokeless tobacco: Never  Vaping Use   Vaping Use: Never used  Substance Use Topics   Alcohol use: No   Drug use: No    Home Medications Prior to Admission medications   Medication Sig Start Date End Date Taking? Authorizing Provider  predniSONE (DELTASONE) 10 MG tablet Take 4 tablets (40 mg total) by mouth daily. 01/30/21  Yes Fredia Sorrow, MD  acetaminophen (TYLENOL) 500 MG tablet Take 1,000 mg by mouth every 6 (six) hours as needed for mild pain or moderate pain.    [provider]  albuterol (VENTOLIN HFA) 108 (90 Base) MCG/ACT inhaler USE 1 TO 2 INHALATIONS BY  MOUTH EVERY 6 HOURS AS  NEEDED FOR WHEEZING OR  SHORTNESS OF BREATH Patient taking differently: Inhale 1-2 puffs into the lungs every 6 (six) hours as needed for shortness of breath or wheezing. 03/03/20   [name], NP  amiodarone (PACERONE) 200 MG tablet Take 1 tablet (200 mg total) by mouth daily. 01/26/21   Debbe Odea, MD  amiodarone (PACERONE) 400 MG tablet Take 1 tablet (400 mg total) by mouth 2 (two) times daily. 01/19/21   Debbe Odea, MD  apixaban (ELIQUIS) 2.5 MG TABS tablet Take 1 tablet (2.5 mg total) by mouth 2 (two) times daily. 01/19/21   Debbe Odea, MD  aspirin EC 81 MG tablet Take 1 tablet (81 mg total) by mouth daily. 03/05/18   Lelon Perla, MD  Aspirin-Acetaminophen-Caffeine (GOODYS EXTRA STRENGTH) [phone] MG PACK Take 1 Package by mouth 2 (two) times daily as needed (pain/headache).    [provider]  Aspirin-Salicylamide-Caffeine (BC HEADACHE POWDER PO) Take 1  Package by mouth 2 (two) times daily as needed (headache/pain).    [provider]  b complex vitamins tablet Take 1 tablet by mouth daily.    [provider]  Calcium Citrate-Vitamin D (EQ CALCIUM CITRATE+D3 PO) Take 1,200 mg by mouth daily.    [provider]  carvedilol (COREG) 3.125 MG tablet Take 1 tablet (3.125 mg total) by mouth 2 (two) times daily with a meal. 01/19/21   Debbe Odea, MD  chlorpheniramine-HYDROcodone (TUSSIONEX PENNKINETIC ER) 10-8 MG/5ML SUER Take 5 mLs by mouth every 12 (twelve) hours as needed for cough. 01/24/21   Alain Honey  M, MD  Cholecalciferol (VITAMIN D-3) 5000 units TABS Take 10,000 Units by mouth daily.    [provider]  Coenzyme Q10 (COQ10) 200 MG CAPS Take 200 mg by mouth daily.    [provider]  doxazosin (CARDURA) 4 MG tablet TAKE 1 TABLET BY MOUTH  DAILY TO HELP CONTROL BLOOD PRESSURE Patient taking differently: Take 4 mg by mouth daily. 10/31/20   [name], NP  famotidine (PEPCID) 20 MG tablet Take 1 tablet (20 mg total) by mouth daily. 10/31/20   [name], NP  fexofenadine (ALLEGRA) 180 MG tablet Take 180 mg by mouth daily as needed for allergies.    [provider]  finasteride (PROSCAR) 5 MG tablet TAKE 1 TABLET BY MOUTH  EVERY MORNING Patient taking differently: Take 5 mg by mouth every morning. 11/28/20   [name], NP  folic acid (FOLVITE) 1 MG tablet TAKE 1 TABLET BY MOUTH  DAILY Patient taking differently: Take 1 mg by mouth daily. 03/21/20   [name], NP  lisinopril (ZESTRIL) 5 MG tablet Take 1 tablet (5 mg total) by mouth daily. 01/19/21   Debbe Odea, MD  Multiple Vitamin (MULTIVITAMIN) tablet Take 1 tablet by mouth daily.    [provider]  ondansetron (ZOFRAN) 4 MG tablet Take 1 tablet (4 mg total) by mouth every 6 (six) hours. 01/16/21   Valarie Merino, MD  pantoprazole (PROTONIX) 40 MG tablet Take 1 tablet (40 mg total) by mouth  daily. 10/31/20   [name], NP  rosuvastatin (CRESTOR) 20 MG tablet TAKE 1 TABLET BY MOUTH ONCE DAILY FOR CHOLESTEROL Patient taking differently: Take 20 mg by mouth daily. 03/21/20   [name], NP    Allergies    Compazine [prochlorperazine edisylate]  Review of Systems   Review of Systems  Constitutional:  Negative for chills and fever.  HENT:  Negative for ear pain, sore throat and trouble swallowing.   Eyes:  Negative for pain and visual disturbance.  Respiratory:  Positive for cough and shortness of breath.   Cardiovascular:  Negative for chest pain and palpitations.  Gastrointestinal:  Positive for diarrhea. Negative for abdominal pain and vomiting.  Genitourinary:  Negative for dysuria and hematuria.  Musculoskeletal:  Negative for arthralgias and back pain.  Skin:  Negative for color change and rash.  Neurological:  Negative for seizures and syncope.  All other systems reviewed and are negative.  Physical Exam Updated Vital Signs BP 132/75   Pulse 76   Temp 98.1 F (36.7 C) (Oral)   Resp 18   SpO2 98%   Physical Exam Vitals and nursing note reviewed.  Constitutional:      General: He is not in acute distress.    Appearance: Normal appearance. He is well-developed.  HENT:     Head: Normocephalic and atraumatic.  Eyes:     Extraocular Movements: Extraocular movements intact.     Conjunctiva/sclera: Conjunctivae normal.     Pupils: Pupils are equal, round, and reactive to light.  Cardiovascular:     Rate and Rhythm: Normal rate and regular rhythm.     Heart sounds: No murmur heard. Pulmonary:     Effort: Pulmonary effort is normal. No respiratory distress.     Breath sounds: No stridor. Rhonchi present. No wheezing or rales.  Abdominal:     Palpations: Abdomen is soft.     Tenderness: There is no abdominal tenderness.  Musculoskeletal:        General:  No swelling. Normal range of motion.     Cervical back: Normal range of motion and neck  supple.  Skin:    General: Skin is warm and dry.     Capillary Refill: Capillary refill takes less than 2 seconds.  Neurological:     General: No focal deficit present.     Mental Status: He is alert and oriented to person, place, and time.     Cranial Nerves: No cranial nerve deficit.     Sensory: No sensory deficit.     Motor: No weakness.    ED Results / Procedures / Treatments   Labs (all labs ordered are listed, but only abnormal results are displayed) Labs Reviewed  CBC WITH DIFFERENTIAL/PLATELET - Abnormal; Notable for the following components:      Result Value   RBC 4.01 (*)    Hemoglobin 12.0 (*)    HCT 35.3 (*)    Platelets 415 (*)    Monocytes Absolute 1.1 (*)    All other components within normal limits  COMPREHENSIVE METABOLIC PANEL - Abnormal; Notable for the following components:   Sodium 131 (*)    Glucose, Bld 112 (*)    All other components within normal limits    EKG EKG Interpretation  Date/Time:  Monday January 30 2021 18:20:44 EDT Ventricular Rate:  72 PR Interval:  163 QRS Duration: 132 QT Interval:  391 QTC Calculation: 428 R Axis:   36 Text Interpretation: Sinus rhythm Ventricular bigeminy Consider left atrial enlargement IVCD, consider atypical LBBB No significant change since last tracing or previous tracings Confirmed by Fredia Sorrow [phone]) on 01/30/2021 6:24:40 PM  Radiology DG Chest Port 1 View  Result Date: 01/30/2021 CLINICAL DATA:  Short of breath, COVID-19 diagnosed 01/14/2021 EXAM: PORTABLE CHEST 1 VIEW COMPARISON:  01/27/2021 FINDINGS: Single frontal view of the chest demonstrates postsurgical changes from CABG. Cardiac silhouette is unremarkable. No airspace disease, effusion, or pneumothorax. IMPRESSION: 1. Stable chest, no acute process. Electronically Signed   By: Randa Ngo M.D.   On: 01/30/2021 19:28    Procedures Procedures   Medications Ordered in ED Medications  predniSONE (DELTASONE) tablet 60 mg (60 mg Oral  Given 01/30/21 2011)    ED Course  I have reviewed the triage vital signs and the nursing notes.  Pertinent labs & imaging results that were available during my care of the patient were reviewed by me and considered in my medical decision making (see chart for details).    MDM Rules/Calculators/A&P                           Patient here very nontoxic.  No atrial fibrillation on monitor or EKG.  Oxygen saturations are excellent they are 99-100%.  Did witness some of the "" throat spasms.  Patient maintains his sats through it.  They last approximately about 2 minutes and then resolve and do not happen that frequently.  Lungs are clear there is no stridor there is no wheezing.  Patient has albuterol inhaler but sounds like is not using on a regular basis encouraged that he do that.  Also encouraging patient for a trial of prednisone.  We will give 60 mg here and have him do 40 mg for the next 5 days this may help.  Patient's renal function was off a bit the last few days.  No evidence of any renal insufficiency today.  GFR is greater than 60.  Patient without any chest pain.  Feel that some of this may be brought on by some anxiety when he starts to cough.  Patient is able to have food go down and have liquids go down.  Hopefully the prednisone will help clear this up.  No indication for admission at this time. Final Clinical Impression(s) / ED Diagnoses Final diagnoses:  COVID  Spasm of throat    Rx / DC Orders ED Discharge Orders          Ordered    predniSONE (DELTASONE) 10 MG tablet  Daily        01/30/21 2049             Fredia Sorrow, MD 01/30/21 2103

## 2021-01-30 NOTE — ED Notes (Signed)
Prednisone crushed and placed in applesauce for pt; encouraged pt to call if needed, pt verbalized understanding

## 2021-01-30 NOTE — ED Notes (Signed)
Pt called out stating he is unable to breathe; Dr. Helane Gunther in to see pt, pt gasping for breath but O2 sats at 100%; RT in room; pt able to calm down as EDP talking with pt

## 2021-01-30 NOTE — ED Notes (Signed)
RT assessed upon arrival. SOB with increased WOB. SAT 100%. No wheezing, but is very coarse. Patient stated that he took his MDI on the way over. Covid Positive on the 10th.

## 2021-01-30 NOTE — ED Triage Notes (Signed)
Patient ambulatory to room, c/o shortness of breath, room air sats 100%, breathing labored.  Patient dx'd with covid on 9/10, seen here multiple times since including admission for N-stemi.

## 2021-01-30 NOTE — Telephone Encounter (Signed)
Received triage call from wife. Obtained permission to speak to wife from pt.   Pt is very SOB and was seen a ED for same. Wife states that pt is gasping, speaking with pt, he is SOB, but not gasping.   Per protocol pt needs to be seen at ED immediately.  Pt and wife agree with plan. Leaving for ED now - less than 5 minutes away.  Reason for Disposition  [1] MODERATE difficulty breathing (e.g., speaks in phrases, SOB even at rest, pulse 100-120) AND [2] NEW-onset or WORSE than normal  Answer Assessment - Initial Assessment Questions 1. RESPIRATORY STATUS: "Describe your breathing?" (e.g., wheezing, shortness of breath, unable to speak, severe coughing)      gasping 2. ONSET: "When did this breathing problem begin?"      Ongoing  - a bit worse now 3. PATTERN "Does the difficult breathing come and go, or has it been constant since it started?"      Constant 4. SEVERITY: "How bad is your breathing?" (e.g., mild, moderate, severe)    - MILD: No SOB at rest, mild SOB with walking, speaks normally in sentences, can lie down, no retractions, pulse < 100.    - MODERATE: SOB at rest, SOB with minimal exertion and prefers to sit, cannot lie down flat, speaks in phrases, mild retractions, audible wheezing, pulse 100-120.    - SEVERE: Very SOB at rest, speaks in single words, struggling to breathe, sitting hunched forward, retractions, pulse > 120      Moderate to severe 5. RECURRENT SYMPTOM: "Have you had difficulty breathing before?" If Yes, ask: "When was the last time?" and "What happened that time?"      Yes - a few days ago 6. CARDIAC HISTORY: "Do you have any history of heart disease?" (e.g., heart attack, angina, bypass surgery, angioplasty)      na 7. LUNG HISTORY: "Do you have any history of lung disease?"  (e.g., pulmonary embolus, asthma, emphysema)     Yes - asthma 8. CAUSE: "What do you think is causing the breathing problem?"      Covid 9. OTHER SYMPTOMS: "Do you have any other  symptoms? (e.g., dizziness, runny nose, cough, chest pain, fever)     na 10. O2 SATURATION MONITOR:  "Do you use an oxygen saturation monitor (pulse oximeter) at home?" If Yes, "What is your reading (oxygen level) today?" "What is your usual oxygen saturation reading?" (e.g., 95%)       na 11. PREGNANCY: "Is there any chance you are pregnant?" "When was your last menstrual period?"       na 12. TRAVEL: "Have you traveled out of the country in the last month?" (e.g., travel history, exposures)       na  Protocols used: Breathing Difficulty-A-AH

## 2021-01-30 NOTE — ED Notes (Signed)
ED Provider at bedside. 

## 2021-01-31 NOTE — Telephone Encounter (Signed)
Appears pt has had several trips to the ED mostly Covid related ./cy

## 2021-02-06 NOTE — Progress Notes (Signed)
Cardiology Office Note:    Date:  02/08/2021   ID:  Brent Griffith, DOB 1935/12/03, MRN [id number]  PCP:  [name], NP   [facility] Providers Cardiologist:  Kirk Ruths, MD      Referring MD: [name], NP   Follow-up for HFrEF, new onset atrial fibrillation, and hypertension.  History of Present Illness:    Brent Griffith is a 85 y.o. male with a hx of coronary artery disease status post CABG, HTN, HLD, anemia, persistent cough, and asthma.  Nuclear stress test 4/18 showed low risk with prior inferolateral infarct and mild apical ischemia, EF 48 with apical hypokinesis and mild LVE, medical management was recommended.  He was seen by Dr. Stanford Breed on 11/26/2020.  During that time he continues to do well from a cardiac standpoint.  Cardiology was consulted on 01/19/2021 for evaluation of his new onset atrial fibrillation in the setting of COVID-19 infection.  He was diagnosed with COVID-19 on September 10 during emergency room evaluation for generalized weakness.  He was started on Paxlovid at that time.  He returned to the emergency department on 9/12 due to a positive culture for Streptococcus.  He received Rocephin and was started on Augmentin.  His repeat blood cultures showed no growth.  For his new onset atrial fibrillation he was placed on amiodarone, and his heparin was transitioned to apixaban.  His TSH was normal.  His onset of atrial fibrillation was felt to be due to his COVID-19 infection.  His echocardiogram 01/19/2021 showed LVEF 40-45%, normal RV function, mildly elevated pulmonary artery systolic pressure, mild mitral valve regurgitation, and mild aortic valve regurgitation.  He presents the clinic today for follow-up evaluation states he is continuing to recover from his COVID-19 infection.  He has been noticing increased mucus/phlegm production and has had some relief with Mucinex.  He reports increased urination.  His wife states that he does not  hydrate very well.  His blood pressure in the clinic today is 90/50.  I have advised him to obtain a blood pressure cuff and check his blood pressure regularly.  We will have him hold his lisinopril for a systolic blood pressure less than 100 and reduce the lisinopril to 2.5 mg daily.  I will also reduce his doxazosin to 4 mg daily.  I have also asked him to increase the sodium in his diet.  We will refill his amiodarone, have him increase his physical activity as tolerated and follow-up with cardiology as scheduled.  We will plan for up titration of guideline medical therapy in the future when tolerated.  Today he denies chest pain, shortness of breath, lower extremity edema, fatigue, palpitations, melena, hematuria, hemoptysis, diaphoresis, weakness, presyncope, syncope, orthopnea, and PND.   Past Medical History:  Diagnosis Date   Acute gastric ulcer without mention of hemorrhage, perforation, or obstruction    Allergic rhinitis, cause unspecified    Anemia, unspecified    Arthritis    Asthma    Blood transfusion without reported diagnosis    CAD (coronary artery disease)    Diaphragmatic hernia without mention of obstruction or gangrene    Elevated prostate specific antigen (PSA)    Enlarged prostate    Esophageal reflux    Extrinsic asthma, unspecified    Herpes zoster without mention of complication    Hyperlipidemia    Hypersomnia with sleep apnea, unspecified    Impotence of organic origin    Kyphosis    Lumbago  Obstructive sleep apnea (adult) (pediatric)    Pneumonia    hx of years ago    Senile osteoporosis    Trigger finger (acquired)    Tubular adenoma of colon 05/2013   Unspecified essential hypertension    Unspecified sleep apnea    CPAP- settings 4-12    Unspecified vitamin D deficiency     Past Surgical History:  Procedure Laterality Date   CARDIAC CATHETERIZATION  03/04/2009   Dr [name]   COLONOSCOPY     CORONARY ARTERY BYPASS GRAFT  2010    CYSTOSCOPY WITH RETROGRADE PYELOGRAM, URETEROSCOPY AND STENT PLACEMENT Right 10/04/2013   Procedure: CYSTOSCOPY WITH RETROGRADE PYELOGRAM,  AND STENT PLACEMENT;  Surgeon: Festus Aloe, MD;  Location: [facility];  Service: Urology;  Laterality: Right;   DENTAL SURGERY N/A    MOLE REMOVAL  1958   chin   ROTATOR CUFF REPAIR Left 04/1998   with bone spur removed, Dr French Ana   TONSILLECTOMY  1945   TRANSURETHRAL RESECTION OF PROSTATE N/A 09/24/2013   Procedure: TRANSURETHRAL RESECTION OF THE PROSTATE (TURP) WITH GYRUS (STAGED RIGHT LATERAL AND MEDIAN LOBE);  Surgeon: Ailene Rud, MD;  Location: [facility];  Service: Urology;  Laterality: N/A;   UPPER GI ENDOSCOPY      Current Medications: Current Meds  Medication Sig   acetaminophen (TYLENOL) 500 MG tablet Take 1,000 mg by mouth every 6 (six) hours as needed for mild pain or moderate pain.   albuterol (VENTOLIN HFA) 108 (90 Base) MCG/ACT inhaler USE 1 TO 2 INHALATIONS BY  MOUTH EVERY 6 HOURS AS  NEEDED FOR WHEEZING OR  SHORTNESS OF BREATH (Patient taking differently: Inhale 1-2 puffs into the lungs every 6 (six) hours as needed for shortness of breath or wheezing.)   apixaban (ELIQUIS) 2.5 MG TABS tablet Take 1 tablet (2.5 mg total) by mouth 2 (two) times daily.   aspirin EC 81 MG tablet Take 1 tablet (81 mg total) by mouth daily.   Aspirin-Acetaminophen-Caffeine (GOODYS EXTRA STRENGTH) 500-325-65 MG PACK Take 1 Package by mouth 2 (two) times daily as needed (pain/headache).   Aspirin-Salicylamide-Caffeine (BC HEADACHE POWDER PO) Take 1 Package by mouth 2 (two) times daily as needed (headache/pain).   b complex vitamins tablet Take 1 tablet by mouth daily.   Calcium Citrate-Vitamin D (EQ CALCIUM CITRATE+D3 PO) Take 1,200 mg by mouth daily.   carvedilol (COREG) 3.125 MG tablet Take 1 tablet (3.125 mg total) by mouth 2 (two) times daily with a meal.   chlorpheniramine-HYDROcodone (TUSSIONEX PENNKINETIC ER) 10-8 MG/5ML SUER Take 5 mLs by mouth  every 12 (twelve) hours as needed for cough.   Cholecalciferol (VITAMIN D-3) 5000 units TABS Take 10,000 Units by mouth daily.   Coenzyme Q10 (COQ10) 200 MG CAPS Take 200 mg by mouth daily.   doxazosin (CARDURA) 2 MG tablet Take 1 tablet (2 mg total) by mouth daily.   famotidine (PEPCID) 20 MG tablet Take 1 tablet (20 mg total) by mouth daily.   fexofenadine (ALLEGRA) 180 MG tablet Take 180 mg by mouth daily as needed for allergies.   finasteride (PROSCAR) 5 MG tablet TAKE 1 TABLET BY MOUTH  EVERY MORNING (Patient taking differently: Take 5 mg by mouth every morning.)   folic acid (FOLVITE) 1 MG tablet TAKE 1 TABLET BY MOUTH  DAILY (Patient taking differently: Take 1 mg by mouth daily.)   Multiple Vitamin (MULTIVITAMIN) tablet Take 1 tablet by mouth daily.   ondansetron (ZOFRAN) 4 MG tablet Take 1 tablet (4 mg total)  by mouth every 6 (six) hours.   pantoprazole (PROTONIX) 40 MG tablet Take 1 tablet (40 mg total) by mouth daily.   predniSONE (DELTASONE) 10 MG tablet Take 4 tablets (40 mg total) by mouth daily.   rosuvastatin (CRESTOR) 20 MG tablet TAKE 1 TABLET BY MOUTH ONCE DAILY FOR CHOLESTEROL (Patient taking differently: Take 20 mg by mouth daily.)   [DISCONTINUED] amiodarone (PACERONE) 200 MG tablet Take 1 tablet (200 mg total) by mouth daily.   [DISCONTINUED] amiodarone (PACERONE) 400 MG tablet Take 1 tablet (400 mg total) by mouth 2 (two) times daily.   [DISCONTINUED] doxazosin (CARDURA) 4 MG tablet TAKE 1 TABLET BY MOUTH  DAILY TO HELP CONTROL BLOOD PRESSURE (Patient taking differently: Take 4 mg by mouth daily.)   [DISCONTINUED] lisinopril (ZESTRIL) 5 MG tablet Take 1 tablet (5 mg total) by mouth daily.     Allergies:   Compazine [prochlorperazine edisylate]   Social History   Socioeconomic History   Marital status: Married    Spouse name: Not on file   Number of children: 0   Years of education: Not on file   Highest education level: Not on file  Occupational History    Occupation: Surveyor, quantity  Tobacco Use   Smoking status: Never   Smokeless tobacco: Never  Vaping Use   Vaping Use: Never used  Substance and Sexual Activity   Alcohol use: No   Drug use: No   Sexual activity: Not Currently  Other Topics Concern   Not on file  Social History Narrative   Not on file   Social Determinants of Health   Financial Resource Strain: Not on file  Food Insecurity: Not on file  Transportation Needs: Not on file  Physical Activity: Not on file  Stress: Not on file  Social Connections: Not on file     Family History: The patient's family history includes Breast cancer in his mother; Cirrhosis in his mother; Heart disease in his father and paternal grandfather; Hypertension in his father; Kyphosis in his mother; Rheumatic fever in his mother; Stroke in his father and mother. There is no history of Colon cancer.  ROS:   Please see the history of present illness.     All other systems reviewed and are negative.   Risk Assessment/Calculations:    CHA2DS2-VASc Score = 4    This indicates a 4.8% annual risk of stroke. The patient's score is based upon: CHF History: 0 HTN History: 1 Diabetes History: 0 Stroke History: 0 Vascular Disease History: 1 Age Score: 2 Gender Score: 0          Physical Exam:    VS:  BP (!) 90/50   Pulse 90   Ht 5\' 2"  (1.575 m)   Wt 121 lb 9.6 oz (55.2 kg)   SpO2 98%   BMI 22.24 kg/m     Wt Readings from Last 3 Encounters:  02/08/21 121 lb 9.6 oz (55.2 kg)  01/27/21 122 lb (55.3 kg)  01/24/21 125 lb 9.6 oz (57 kg)     GEN:  Well nourished, well developed in no acute distress HEENT: Normal NECK: No JVD; No carotid bruits LYMPHATICS: No lymphadenopathy CARDIAC: RRR, no murmurs, rubs, gallops RESPIRATORY:  Clear to auscultation without rales, wheezing or rhonchi  ABDOMEN: Soft, non-tender, non-distended MUSCULOSKELETAL:  No edema; No deformity  SKIN: Warm and dry NEUROLOGIC:  Alert and oriented  x 3 PSYCHIATRIC:  Normal affect    EKGs/Labs/Other Studies Reviewed:    The following studies  were reviewed today:  Echocardiogram 01/19/2021 IMPRESSIONS     1. Compared to echo from 2015, LVEF is depressed with wall motion  abnormalities noted.   2. LVEF is depressed with hypokinesis inf the inferior and distal lateral  and apical walls . Left ventricular ejection fraction, by estimation, is  40 to 45%. The left ventricle has mildly decreased function.   3. Right ventricular systolic function is normal. The right ventricular  size is normal. There is mildly elevated pulmonary artery systolic  pressure.   4. Mild mitral valve regurgitation.   5. Aortic valve regurgitation is mild. Mild aortic valve sclerosis is  present, with no evidence of aortic valve stenosis.  EKG:  EKG is  ordered today.  The ekg ordered today demonstrates normal sinus rhythm minimal voltage criteria for LVH ST and T wave abnormality consider lateral ischemia 90 bpm  Recent Labs: 01/19/2021: Magnesium 1.6; TSH 1.253 01/30/2021: ALT 21; BUN 23; Creatinine, Ser 1.19; Hemoglobin 12.0; Platelets 415; Potassium 4.3; Sodium 131  Recent Lipid Panel    Component Value Date/Time   CHOL 113 04/22/2020 1026   CHOL 111 07/04/2015 0811   TRIG 61 04/22/2020 1026   HDL 58 04/22/2020 1026   HDL 52 07/04/2015 0811   CHOLHDL 1.9 04/22/2020 1026   VLDL 12 10/05/2016 0812   LDLCALC 41 04/22/2020 1026    ASSESSMENT & PLAN    HFrEF-no increased DOE or activity intolerance.  Slowly increasing physical activity and recovering from COVID-19 infection.  Feels his breathing has returned to baseline.  Echocardiogram showed EF 40-45%, normal RV function, mildly elevated pulmonary artery systolic pressure, mild mitral valve regurgitation, and mild aortic valve regurgitation.  Recommend repeat echocardiogram 1 month after medication has been optimized.  Plan to titrate up guideline medical therapy when titrated. Reduce lisinopril  2.5mg  daily-hold with systolic blood pressure less than 100 Continue carvedilol Heart healthy  diet Increase physical activity as tolerated Maintain blood pressure log  New onset atrial fibrillation-EKG today shows 90.  Reports compliance with his apixaban and denies bleeding issues. Continue amiodarone, apixaban Heart healthy low-sodium diet-salty 6 given Increase physical activity as tolerated  Essential hypertension-BP today 90/50.  Reports increased urination.  Increase p.o. hydration.  May slightly increase sodium in diet. Continue carvedilol,  Reduce doxazosin to 2 mg daily Reduce lisinopril 2.5mg  daily Heart healthy low-sodium diet-salty 6 given Increase physical activity as tolerated  Hyperlipidemia-04/22/2020: Cholesterol 113; HDL 58; LDL Cholesterol (Calc) 41; Triglycerides 61 Continue aspirin, rosuvastatin Heart healthy low-sodium diet-salty 6 given Increase physical activity as tolerated Repeat fasting lipids and LFTs 12/22   Follow-up with Dr. Stanford Breed or me in 1 month.       Medication Adjustments/Labs and Tests Ordered: Current medicines are reviewed at length with the patient today.  Concerns regarding medicines are outlined above.  Orders Placed This Encounter  Procedures   Lipid panel   Hepatic function panel   EKG 12-Lead    Meds ordered this encounter  Medications   amiodarone (PACERONE) 200 MG tablet    Sig: Take 1 tablet (200 mg total) by mouth daily.    Dispense:  90 tablet    Refill:  1   doxazosin (CARDURA) 2 MG tablet    Sig: Take 1 tablet (2 mg total) by mouth daily.    Dispense:  90 tablet    Refill:  1   lisinopril (ZESTRIL) 5 MG tablet    Sig: Take 0.5 tablets (2.5 mg total) by mouth daily. If your systolic  blood pressure (top number) is less than 100, HOLD this medication and call our office.    Dispense:  45 tablet    Refill:  1        Signed, Deberah Pelton, NP  02/08/2021 12:39 PM      Notice: This dictation was prepared  with Dragon dictation along with smaller phrase technology. Any transcriptional errors that result from this process are unintentional and may not be corrected upon review.  I spent 14 minutes examining this patient, reviewing medications, and using patient centered shared decision making involving her cardiac care.  Prior to her visit I spent greater than 20 minutes reviewing her past medical history,  medications, and prior cardiac tests.

## 2021-02-08 ENCOUNTER — Other Ambulatory Visit: Payer: Self-pay

## 2021-02-08 ENCOUNTER — Encounter (HOSPITAL_BASED_OUTPATIENT_CLINIC_OR_DEPARTMENT_OTHER): Payer: Self-pay | Admitting: General Practice

## 2021-02-08 ENCOUNTER — Ambulatory Visit (HOSPITAL_BASED_OUTPATIENT_CLINIC_OR_DEPARTMENT_OTHER): Payer: Medicare Other | Admitting: General Practice

## 2021-02-08 ENCOUNTER — Telehealth: Payer: Self-pay | Admitting: General Practice

## 2021-02-08 VITALS — BP 90/50 | HR 90 | Ht 62.0 in | Wt 121.6 lb

## 2021-02-08 DIAGNOSIS — E7800 Pure hypercholesterolemia, unspecified: Secondary | ICD-10-CM

## 2021-02-08 DIAGNOSIS — I5020 Unspecified systolic (congestive) heart failure: Secondary | ICD-10-CM | POA: Diagnosis not present

## 2021-02-08 DIAGNOSIS — I10 Essential (primary) hypertension: Secondary | ICD-10-CM | POA: Diagnosis not present

## 2021-02-08 DIAGNOSIS — I4891 Unspecified atrial fibrillation: Secondary | ICD-10-CM

## 2021-02-08 MED ORDER — AMIODARONE HCL 200 MG PO TABS: 200.0000 mg | ORAL_TABLET | Freq: Every day | ORAL | 1 refills | Status: DC

## 2021-02-08 MED ORDER — AMIODARONE HCL 200 MG PO TABS: 200.0000 mg | ORAL_TABLET | Freq: Every day | ORAL | 0 refills | Status: DC

## 2021-02-08 MED ORDER — DOXAZOSIN MESYLATE 2 MG PO TABS: 2.0000 mg | ORAL_TABLET | Freq: Every day | ORAL | 1 refills | Status: DC

## 2021-02-08 MED ORDER — LISINOPRIL 5 MG PO TABS: 2.5000 mg | ORAL_TABLET | Freq: Every day | ORAL | 1 refills | Status: DC

## 2021-02-08 NOTE — Patient Instructions (Signed)
Medication Instructions:   Decrease Doxazosin to 2 mg once daily.  Decrease Lisinopril to 1/2 tablet (2.5 mg) once daily.  *If you need a refill on your cardiac medications before your next appointment, please call your pharmacy*   Lab Work: Your physician recommends that you return for a FASTING lipid profile and liver function test before your next appointment.  If you have labs (blood work) drawn today and your tests are completely normal, you will receive your results only by: [facility] (if you have MyChart) OR A paper copy in the mail If you have any lab test that is abnormal or we need to change your treatment, we will call you to review the results.  Follow-Up: At [facility], you and your health needs are our priority.  As part of our continuing mission to provide you with exceptional heart care, we have created designated Provider Care Teams.  These Care Teams include your primary Cardiologist (physician) and Advanced Practice Providers (APPs -  Physician Assistants and Nurse Practitioners) who all work together to provide you with the care you need, when you need it.  We recommend signing up for the patient portal called "MyChart".  Sign up information is provided on this After Visit Summary.  MyChart is used to connect with patients for Virtual Visits (Telemedicine).  Patients are able to view lab/test results, encounter notes, upcoming appointments, etc.  Non-urgent messages can be sent to your provider as well.   To learn more about what you can do with MyChart, go to [email or web address].    Your next appointment:   Please keep you scheduled appointment in November   Other Instructions  Coletta Memos, NP has recommended increasing your hydration, ok to drink sports drinks, and add more salt to your diet. He has also keeping a blood pressure log. Please review the information below. Thank you!  Tips to Measure your Blood Pressure Correctly  To determine  whether you have hypertension, a medical professional will take a blood pressure reading. How you prepare for the test, the position of your arm, and other factors can change a blood pressure reading by 10% or more. That could be enough to hide high blood pressure, start you on a drug you don't really need, or lead your doctor to incorrectly adjust your medications.  National and international guidelines offer specific instructions for measuring blood pressure. If a doctor, nurse, or medical assistant isn't doing it right, don't hesitate to ask him or her to get with the guidelines.  Here's what you can do to ensure a correct reading:  Don't drink a caffeinated beverage or smoke during the 30 minutes before the test.  Sit quietly for five minutes before the test begins.  During the measurement, sit in a chair with your feet on the floor and your arm supported so your elbow is at about heart level.  The inflatable part of the cuff should completely cover at least 80% of your upper arm, and the cuff should be placed on bare skin, not over a shirt.  Don't talk during the measurement.  Have your blood pressure measured twice, with a brief break in between. If the readings are different by 5 points or more, have it done a third time.  In 2017, new guidelines from the [place], the [employer] of Cardiology, and nine other health organizations lowered the diagnosis of high blood pressure to 130/80 mm Hg or higher for all adults. The guidelines also redefined the various  blood pressure categories to now include normal, elevated, Stage 1 hypertension, Stage 2 hypertension, and hypertensive crisis (see "Blood pressure categories").  Blood pressure categories  Blood pressure category SYSTOLIC (upper number)  DIASTOLIC (lower number)  Normal Less than 120 mm Hg and Less than 80 mm Hg  Elevated 120-129 mm Hg and Less than 80 mm Hg  High blood pressure: Stage 1 hypertension 130-139 mm  Hg or 80-89 mm Hg  High blood pressure: Stage 2 hypertension 140 mm Hg or higher or 90 mm Hg or higher  Hypertensive crisis (consult your doctor immediately) Higher than 180 mm Hg and/or Higher than 120 mm Hg  Source: American Heart Association and American Stroke Association. For more on getting your blood pressure under control, buy Controlling Your Blood Pressure, a Special Health Report from [facility].   Blood Pressure Log   Date   Time  Blood Pressure  Position  Example: Nov 1 9 AM 124/78 sitting

## 2021-02-08 NOTE — Telephone Encounter (Signed)
*  STAT* If patient is at the pharmacy, call can be transferred to refill team.   1. Which medications need to be refilled? (please list name of each medication and dose if known) amiodarone (PACERONE) 200 MG tablet  2. Which pharmacy/location (including street and city if local pharmacy) is medication to be sent to? [facility] [id number] - [place]  3. Do they need a 30 day or 90 day supply? 30 day   Patient is completely out of medication

## 2021-02-08 NOTE — Telephone Encounter (Signed)
Amiodarone sent to [facility] in [place]---30 day with no refills. Sent 90 day to [facility] during appointment today.

## 2021-02-08 NOTE — Addendum Note (Signed)
Addended by: Ernie Hew D on: 02/08/2021 03:45 PM   Modules accepted: Orders

## 2021-02-13 ENCOUNTER — Telehealth: Payer: Self-pay | Admitting: General Practice

## 2021-02-13 MED ORDER — APIXABAN 2.5 MG PO TABS: 2.5000 mg | ORAL_TABLET | Freq: Two times a day (BID) | ORAL | 2 refills | Status: DC

## 2021-02-13 MED ORDER — LISINOPRIL 2.5 MG PO TABS: 2.5000 mg | ORAL_TABLET | Freq: Every day | ORAL | 2 refills | Status: DC

## 2021-02-13 MED ORDER — LISINOPRIL 2.5 MG PO TABS: 2.5000 mg | ORAL_TABLET | Freq: Every day | ORAL | 3 refills | Status: DC

## 2021-02-13 MED ORDER — CARVEDILOL 3.125 MG PO TABS: 3.1250 mg | ORAL_TABLET | Freq: Two times a day (BID) | ORAL | 2 refills | Status: DC

## 2021-02-13 MED ORDER — CARVEDILOL 3.125 MG PO TABS: 3.1250 mg | ORAL_TABLET | Freq: Two times a day (BID) | ORAL | 3 refills | Status: DC

## 2021-02-13 NOTE — Telephone Encounter (Signed)
*  STAT* If patient is at the pharmacy, call can be transferred to refill team.   1. Which medications need to be refilled? (please list name of each medication and dose if known)  apixaban (ELIQUIS) 2.5 MG TABS tablet lisinopril (ZESTRIL) 5 MG tablet carvedilol (COREG) 3.125 MG tablet  2. Which pharmacy/location (including street and city if local pharmacy) is medication to be sent to? [id number] - [place], [place] - [address], [facility]  3. Do they need a 30 day or 90 day supply? 30 day to [facility], 90 day to [facility]   Pt c/o medication issue:  1. Name of Medication: lisinopril (ZESTRIL) 5 MG tablet  2. How are you currently taking this medication (dosage and times per day)? Half a tablet daily  3. Are you having a reaction (difficulty breathing--STAT)? no  4. What is your medication issue? Patient states the tablet is hard to split in half. He would like to see if there is a smaller dose for the full tablet. He also states his BP has been better and has stayed above 100.

## 2021-02-13 NOTE — Telephone Encounter (Signed)
Left message for patient --  prescription were sent to  local pharmacy and mail order -  per patient request.

## 2021-02-17 NOTE — Progress Notes (Signed)
Subjective:    Patient ID: Brent Griffith, male    DOB: 02/02/1936, 84 y.o.   MRN: [id number]  HPI  male never smoker followed for OSA, periodic limb movement, complicated by CAD/CABG,   HBP, GERD,                 NPSG 12/15/04- AHI  9.2/ hr, desaturation to 76%, PLMA 13.6/ hr, body weight 170 lbs Heart surgery Nov 2010 CABG 4v PFT: 05/04/2014: Normal spirometry flows, insignificant response to bronchodilator, moderate diffusion defect 59% of predicted, possible air trapping ---------------------------------------------------------------------------------------------------------   09/01/20- 85 yo male never smoker followed for OSA, periodic limb movement, complicated by CAD/CABG,  HTN, GERD,  Allergic Rhinitis,  CPAP auto 6-12/Adapt Download-compliance 80%, AHI 3.4 Body Weight today-138 lbs Covid vax-3 Moderna Flu vax-had -----No complaints. Occ snore. Sleeps ok.  Has So-Clean machine Rarely needs rescue inhaler.   02/20/21- 85 yo male never smoker followed for OSA, periodic limb movement, complicated by CAD/CABG/ MI/ AFib, ,  HTN, GERD,  Allergic Rhinitis,  Covid infection Sept 2022,  CPAP auto 6-12/Adapt   AirSense 10 AutoSet Download-compliance 80%, AHI 3.4/ hr                             Wife here Body weight today-123 lbs Covid vax- 5 Moderna Flu vax- today senior [facility] early Sept with Covid complicated by new AFib. Paxlovid. Now off prednisone. Still has altered taste and smell. Feels "tired" not short of breath but paroxysmal cough/ scant phlegm. Using Tussionex, stating perles and otc syrups no help. Cardiology will follow after AFib.  Continues Eliquis, amiodarone. Took hiatus from CPAP while acutely ill but resumed routine use few weeks ago.  CXR 01/05/21-  IMPRESSION: 1. Stable chest, no acute process. CXR 01/30/21 1V-  IMPRESSION: 1. Stable chest, no acute process.  Review of Systems- see HPI   + = positive Constitutional:   No-   weight loss, night sweats,  fevers, chills, fatigue, lassitude. HEENT:   No-   headaches, difficulty swallowing, tooth/dental problems, sore throat,                  No-   sneezing, itching, ear ache, nasal congestion, +post nasal drip,  CV:  No-   chest pain, orthopnea, PND, swelling in lower extremities, anasarca,dizziness, palpitations GI:  No-   heartburn, indigestion, abdominal pain, nausea, vomiting,  Resp: +  shortness of breath with exertion or at rest.  No-  excess mucus,            + productive cough,   +non-productive cough,  No-  coughing up of blood.              No-   change in color of mucus.  No- wheezing.   Skin: No-   rash or lesions. GU: . MS:  No-   joint pain or swelling. Marland Kitchen Psych:  No- change in mood or affect. No depression or anxiety.  No memory loss.    Objective:   General- Alert, Oriented, Affect-appropriate, Distress- none acute. +Elderly , frail Skin- rash-none, lesions- none, excoriation- none Lymphadenopathy- none Head- atraumatic            Eyes- Gross vision intact, PERRLA, conjunctivae clear secretions            Ears- Hearing, canals normal            Nose- Clear, +Septal dev/ external nasal deviation,  No-mucus, polyps, erosion, perforation             Throat- Mallampati II-III , mucosa clear , drainage- none, tonsils- atrophic Neck- +cervical kyphosis Chest - symmetrical excursion , unlabored           Heart/CV- RRR(confirmed) , no murmur , no gallop  , no rub, nl s1 s2                           - JVD- none , edema- none, stasis changes- none, varices- none           Lung- clear to P&A, wheeze- none, cough-none, dullness-none, rub- none           Chest wall-  Abd-  Br/ Gen/ Rectal- Not done, not indicated Extrem- cyanosis- none, clubbing, none, atrophy- none, strength- nl Neuro- grossly intact to observation     Assessment & Plan:

## 2021-02-20 ENCOUNTER — Ambulatory Visit (INDEPENDENT_AMBULATORY_CARE_PROVIDER_SITE_OTHER): Payer: Medicare Other | Admitting: Internal Medicine

## 2021-02-20 ENCOUNTER — Encounter: Payer: Self-pay | Admitting: Internal Medicine

## 2021-02-20 ENCOUNTER — Other Ambulatory Visit: Payer: Self-pay

## 2021-02-20 VITALS — BP 124/62 | HR 76 | Temp 98.2°F | Ht 62.0 in | Wt 123.0 lb

## 2021-02-20 DIAGNOSIS — U071 COVID-19: Secondary | ICD-10-CM | POA: Diagnosis not present

## 2021-02-20 DIAGNOSIS — Z23 Encounter for immunization: Secondary | ICD-10-CM | POA: Diagnosis not present

## 2021-02-20 DIAGNOSIS — G4733 Obstructive sleep apnea (adult) (pediatric): Secondary | ICD-10-CM | POA: Diagnosis not present

## 2021-02-20 NOTE — Patient Instructions (Signed)
Order- Flu vax- senior  Order- DME [employer]- please replace mask of choice, supplies Continue CPAP auto 6-12, mask of choice,humidifier, AirView/ card  Please call if we can help

## 2021-02-20 NOTE — Assessment & Plan Note (Signed)
Residual cough. Last CXR clear. Still "tired" and altered taste and smell  Plan- Gradually increase activity as tolerated. Flu vax today.

## 2021-02-20 NOTE — Assessment & Plan Note (Signed)
Benefits from CPAP, recognizing better sleep. Needs new mask and supplies. Plan- continue auto 6-12. [employer] replace mask and supplies.

## 2021-02-23 ENCOUNTER — Other Ambulatory Visit: Payer: Self-pay | Admitting: Nurse Practitioner

## 2021-02-23 DIAGNOSIS — D561 Beta thalassemia: Secondary | ICD-10-CM

## 2021-02-23 DIAGNOSIS — D649 Anemia, unspecified: Secondary | ICD-10-CM

## 2021-03-08 NOTE — Progress Notes (Signed)
Cardiology Office Note:    Date:  03/09/2021   ID:  Brent Griffith, DOB October 18, 1935, MRN [id number]  PCP:  [name], NP [place] Cardiologist: Kirk Ruths, MD   Reason for visit: Hospital follow-up  History of Present Illness:    Brent Griffith is a 85 y.o. male with a hx of coronary artery disease status post CABG, hypertension, hyperlipidemia, anemia, asthma and atrial fibrillation in September 2022 in the setting of COVID-19.   Echocardiogram 01/19/2021 showed LVEF 40-45%, normal RV function, mildly elevated pulmonary artery systolic pressure, mild mitral valve regurgitation, and mild aortic valve regurgitation.  He saw Coletta Memos on February 08, 2021.  His blood pressure was noted to be 90/50.  Denyse Amass recommended patient hold his lisinopril for systolic blood pressure less than 100 and reduce the dose to 2.5 mg daily.  He also reduced his doxazosin to 4 mg daily.  Recommended follow-up in 1 month.  Today, the patient is doing well.  He states he did not feel palpitations when he went to A. fib with COVID but he just did not feel right.  He has not had recurrence of that feeling since.  He denies chest pain, shortness of breath, PND, orthopnea, lower extremity edema, dizziness and syncope.  He shows me blood pressure log systolic blood pressures averaging 110s to 130s over 60s to 70s.  He and his wife are seeing a dietitian.  They state the cost of Eliquis is $100 which is a lot for them.  They are interested to see if it qualify for patient assistance.  No bleeding issues.  He is off aspirin.     Past Medical History:  Diagnosis Date   Acute gastric ulcer without mention of hemorrhage, perforation, or obstruction    Allergic rhinitis, cause unspecified    Anemia, unspecified    Arthritis    Asthma    Blood transfusion without reported diagnosis    CAD (coronary artery disease)    Diaphragmatic hernia without mention of obstruction or gangrene    Elevated  prostate specific antigen (PSA)    Enlarged prostate    Esophageal reflux    Extrinsic asthma, unspecified    Herpes zoster without mention of complication    Hyperlipidemia    Hypersomnia with sleep apnea, unspecified    Impotence of organic origin    Kyphosis    Lumbago    Obstructive sleep apnea (adult) (pediatric)    Pneumonia    hx of years ago    Senile osteoporosis    Trigger finger (acquired)    Tubular adenoma of colon 05/2013   Unspecified essential hypertension    Unspecified sleep apnea    CPAP- settings 4-12    Unspecified vitamin D deficiency     Past Surgical History:  Procedure Laterality Date   CARDIAC CATHETERIZATION  03/04/2009   Dr [name]   COLONOSCOPY     CORONARY ARTERY BYPASS GRAFT  2010   CYSTOSCOPY WITH RETROGRADE PYELOGRAM, URETEROSCOPY AND STENT PLACEMENT Right 10/04/2013   Procedure: CYSTOSCOPY WITH RETROGRADE PYELOGRAM,  AND STENT PLACEMENT;  Surgeon: Festus Aloe, MD;  Location: [facility];  Service: Urology;  Laterality: Right;   DENTAL SURGERY N/A    MOLE REMOVAL  1958   chin   ROTATOR CUFF REPAIR Left 04/1998   with bone spur removed, Dr French Ana   TONSILLECTOMY  1945   TRANSURETHRAL RESECTION OF PROSTATE N/A 09/24/2013   Procedure: TRANSURETHRAL RESECTION OF THE PROSTATE (TURP) WITH GYRUS (STAGED  RIGHT LATERAL AND MEDIAN LOBE);  Surgeon: Ailene Rud, MD;  Location: [facility];  Service: Urology;  Laterality: N/A;   UPPER GI ENDOSCOPY      Current Medications: Current Meds  Medication Sig   acetaminophen (TYLENOL) 500 MG tablet Take 1,000 mg by mouth every 6 (six) hours as needed for mild pain or moderate pain.   albuterol (VENTOLIN HFA) 108 (90 Base) MCG/ACT inhaler USE 1 TO 2 INHALATIONS BY  MOUTH EVERY 6 HOURS AS  NEEDED FOR WHEEZING OR  SHORTNESS OF BREATH (Patient taking differently: Inhale 1-2 puffs into the lungs every 6 (six) hours as needed for shortness of breath or wheezing.)   amiodarone (PACERONE) 200 MG tablet Take  1 tablet (200 mg total) by mouth daily.   b complex vitamins tablet Take 1 tablet by mouth daily.   Calcium Citrate-Vitamin D (EQ CALCIUM CITRATE+D3 PO) Take 1,200 mg by mouth daily.   carvedilol (COREG) 6.25 MG tablet Take 1 tablet (6.25 mg total) by mouth 2 (two) times daily.   chlorpheniramine-HYDROcodone (TUSSIONEX PENNKINETIC ER) 10-8 MG/5ML SUER Take 5 mLs by mouth every 12 (twelve) hours as needed for cough.   Cholecalciferol (VITAMIN D-3) 5000 units TABS Take 10,000 Units by mouth daily.   Coenzyme Q10 (COQ10) 200 MG CAPS Take 200 mg by mouth daily.   doxazosin (CARDURA) 2 MG tablet Take 1 tablet (2 mg total) by mouth daily.   famotidine (PEPCID) 20 MG tablet Take 1 tablet (20 mg total) by mouth daily.   fexofenadine (ALLEGRA) 180 MG tablet Take 180 mg by mouth daily as needed for allergies.   finasteride (PROSCAR) 5 MG tablet TAKE 1 TABLET BY MOUTH  EVERY MORNING (Patient taking differently: Take 5 mg by mouth every morning.)   folic acid (FOLVITE) 1 MG tablet TAKE 1 TABLET BY MOUTH  DAILY   lisinopril (ZESTRIL) 2.5 MG tablet Take 1 tablet (2.5 mg total) by mouth daily.   Multiple Vitamin (MULTIVITAMIN) tablet Take 1 tablet by mouth daily.   ondansetron (ZOFRAN) 4 MG tablet Take 1 tablet (4 mg total) by mouth every 6 (six) hours.   pantoprazole (PROTONIX) 40 MG tablet Take 1 tablet (40 mg total) by mouth daily.   rosuvastatin (CRESTOR) 20 MG tablet TAKE 1 TABLET BY MOUTH ONCE DAILY FOR CHOLESTEROL   [DISCONTINUED] apixaban (ELIQUIS) 2.5 MG TABS tablet Take 1 tablet (2.5 mg total) by mouth 2 (two) times daily.   [DISCONTINUED] apixaban (ELIQUIS) 2.5 MG TABS tablet Take 1 tablet (2.5 mg total) by mouth 2 (two) times daily.   [DISCONTINUED] carvedilol (COREG) 3.125 MG tablet Take 1 tablet (3.125 mg total) by mouth 2 (two) times daily with a meal.   [DISCONTINUED] carvedilol (COREG) 3.125 MG tablet Take 1 tablet (3.125 mg total) by mouth 2 (two) times daily.   [DISCONTINUED] lisinopril  (ZESTRIL) 2.5 MG tablet Take 1 tablet (2.5 mg total) by mouth daily.   [DISCONTINUED] lisinopril (ZESTRIL) 5 MG tablet Take 0.5 tablets (2.5 mg total) by mouth daily. If your systolic blood pressure (top number) is less than 100, HOLD this medication and call our office.     Allergies:   Compazine [prochlorperazine edisylate]   Social History   Socioeconomic History   Marital status: Married    Spouse name: Not on file   Number of children: 0   Years of education: Not on file   Highest education level: Not on file  Occupational History   Occupation: Surveyor, quantity  Tobacco Use  Smoking status: Never   Smokeless tobacco: Never  Vaping Use   Vaping Use: Never used  Substance and Sexual Activity   Alcohol use: No   Drug use: No   Sexual activity: Not Currently  Other Topics Concern   Not on file  Social History Narrative   Not on file   Social Determinants of Health   Financial Resource Strain: Not on file  Food Insecurity: Not on file  Transportation Needs: Not on file  Physical Activity: Not on file  Stress: Not on file  Social Connections: Not on file     Family History: The patient's family history includes Breast cancer in his mother; Cirrhosis in his mother; Heart disease in his father and paternal grandfather; Hypertension in his father; Kyphosis in his mother; Rheumatic fever in his mother; Stroke in his father and mother. There is no history of Colon cancer.  ROS:   Please see the history of present illness.     EKGs/Labs/Other Studies Reviewed:    EKG:  The ekg ordered today demonstrates sinus rhythm with ventricular bigeminy.  Incomplete left bundle branch block.  ST and T wave abnormality unchanged.  Heart rate 63, PR interval 158 ms, QRS duration 120 ms, QT 4 and 30 ms.  Recent Labs: 01/19/2021: Magnesium 1.6; TSH 1.253 01/30/2021: ALT 21; BUN 23; Creatinine, Ser 1.19; Hemoglobin 12.0; Platelets 415; Potassium 4.3; Sodium 131   Recent Lipid  Panel Lab Results  Component Value Date/Time   CHOL 113 04/22/2020 10:26 AM   CHOL 111 07/04/2015 08:11 AM   TRIG 61 04/22/2020 10:26 AM   HDL 58 04/22/2020 10:26 AM   HDL 52 07/04/2015 08:11 AM   LDLCALC 41 04/22/2020 10:26 AM    Physical Exam:    VS:  BP 120/62   Pulse 63   Ht 5\' 2"  (1.575 m)   Wt 123 lb (55.8 kg)   SpO2 99%   BMI 22.50 kg/m    No data found.  Wt Readings from Last 3 Encounters:  03/09/21 123 lb (55.8 kg)  02/20/21 123 lb (55.8 kg)  02/08/21 121 lb 9.6 oz (55.2 kg)     GEN:  Well nourished, well developed in no acute distress HEENT: Normal NECK: No JVD; No carotid bruits CARDIAC: Regular with ectopics, no murmurs, rubs, gallops RESPIRATORY:  Clear to auscultation without rales, wheezing or rhonchi  ABDOMEN: Soft, non-tender, non-distended MUSCULOSKELETAL: No edema; No deformity  SKIN: Warm and dry NEUROLOGIC:  Alert and oriented PSYCHIATRIC:  Normal affect     ASSESSMENT AND PLAN   Chronic systolic heart failure, euvolemic -Echocardiogram 01/19/2021 showed LVEF 40-45% (EF 50-55% in 2015) -Continue BB and ACEI. -Increase Coreg to 6.25 mg twice daily.  Maintain blood pressure log.  If BP can tolerate, add spironolactone 12.5mg  in 2 weeks. -Consider adding SGLT2 in near future.  Would need to consider cost/patient assistance.  Atrial fibrillation in setting of COVID-19 Ventricular bigeminy -Continue amiodarone.  Initially the plan was to continue amiodarone just for 1 month while he recovered from [place].  Today with ventricular bigeminy, we will continue amiodarone 200mg  daily.  Will increase Coreg to 6.25 mg twice daily. -Order Zio patch x2 weeks to ensure no recurrence of atrial fibrillation and to measure PVC burden. -Continue Eliquis for stroke prevention.  Given 4-week samples.  Given patient assistance form.  Coronary artery disease status post CABG -CABG 2010: left internal mammary artery to left anterior descending, sequential saphenous  vein graft to ramus intermedius and obtuse marginal  2, saphenous vein graft to posterior descending -No angina.  Continue statin and beta-blocker therapy.  No aspirin given anticoagulation.  Hypertension, well controlled -Continue medications as above. -Goal BP is <130/80.  Recommend DASH diet (high in vegetables, fruits, low-fat dairy products, whole grains, poultry, fish, and nuts and low in sweets, sugar-sweetened beverages, and red meats), salt restriction and increase physical activity.  Hyperlipidemia -LDL 41 in December 2021.  Continue statin. -Recheck lipids next month. -Discussed cholesterol lowering diets - Mediterranean diet, DASH diet, vegetarian diet, low-carbohydrate diet and avoidance of trans fats.  Discussed healthier choice substitutes.  Nuts, high-fiber foods, and fiber supplements may also improve lipids.    Disposition - Follow-up in 1 month.  We will review blood pressures and see if we are able to optimize CHF med therapy.  At that appointment, check fasting lipids and BMET.        Medication Adjustments/Labs and Tests Ordered: Current medicines are reviewed at length with the patient today.  Concerns regarding medicines are outlined above.  Orders Placed This Encounter  Procedures   LONG TERM MONITOR (3-14 DAYS)   EKG 12-Lead   Meds ordered this encounter  Medications   carvedilol (COREG) 6.25 MG tablet    Sig: Take 1 tablet (6.25 mg total) by mouth 2 (two) times daily.    Dispense:  180 tablet    Refill:  3   DISCONTD: apixaban (ELIQUIS) 2.5 MG TABS tablet    Sig: Take 1 tablet (2.5 mg total) by mouth 2 (two) times daily.    Dispense:  180 tablet    Refill:  3   apixaban (ELIQUIS) 2.5 MG TABS tablet    Sig: Take 1 tablet (2.5 mg total) by mouth 2 (two) times daily.    Dispense:  56 tablet    Refill:  0    Lot [id number] 2023 [id number]    Patient Instructions  Medication Instructions:   Medication changes  Increase  carvedilol 6.25 mg twice a day      Continue taking Eliquis 2.5 mg  twice  a day.  Please fill out  patient assistance  for Eliquis and bring back to the office  *If you need a refill on your cardiac medications before your next appointment, please call your pharmacy*   Lab Work: Not needed    Testing/Procedures: Will be mailed to you in 5 to 10 days  Your physician has recommended that you wear a holter monitor 14 days. Holter monitors are medical devices that record the heart's electrical activity. Doctors most often use these monitors to diagnose arrhythmias. Arrhythmias are problems with the speed or rhythm of the heartbeat. The monitor is a small, portable device. You can wear one while you do your normal daily activities. This is usually used to diagnose what is causing palpitations/syncope (passing out).    Follow-Up: At [facility], you and your health needs are our priority.  As part of our continuing mission to provide you with exceptional heart care, we have created designated Provider Care Teams.  These Care Teams include your primary Cardiologist (physician) and Advanced Practice Providers (APPs -  Physician Assistants and Nurse Practitioners) who all work together to provide you with the care you need, when you need it.     Your next appointment:   1 month(s)  COME FASTING THE MORNING OF APPT FOR LABS   The format for your next appointment:   In Person  Provider:   Caron Presume, PA-C  Other Instructions    Continue to monitor blood pressure - bring b/p reading to next appointment in 1 month    ZIO XT- Long Term Monitor Instructions  Your physician has requested you wear a ZIO patch monitor for 14 days.  This is a single patch monitor. [employer] supplies one patch monitor per enrollment. Additional stickers are not available. Please do not apply patch if you will be having a Nuclear Stress Test,  Echocardiogram, Cardiac CT, MRI, or Chest Xray during the period you would be wearing the   monitor. The patch cannot be worn during these tests. You cannot remove and re-apply the  ZIO XT patch monitor.  Your ZIO patch monitor will be mailed 3 day USPS to your address on file. It may take 3-5 days  to receive your monitor after you have been enrolled.  Once you have received your monitor, please review the enclosed instructions. Your monitor  has already been registered assigning a specific monitor serial # to you.  Billing and Patient Assistance Program Information  We have supplied [employer] with any of your insurance information on file for billing purposes. [employer] offers a sliding scale Patient Assistance Program for patients that do not have  insurance, or whose insurance does not completely cover the cost of the ZIO monitor.  You must apply for the Patient Assistance Program to qualify for this discounted rate.  To apply, please call [employer] at [phone], select option 4, select option 2, ask to apply for  Patient Assistance Program. Theodore Demark will ask your household income, and how many people  are in your household. They will quote your out-of-pocket cost based on that information.  [employer] will also be able to set up a 57-month, interest-free payment plan if needed.  Applying the monitor   Shave hair from upper left chest.  Hold abrader disc by orange tab. Rub abrader in 40 strokes over the upper left chest as  indicated in your monitor instructions.  Clean area with 4 enclosed alcohol pads. Let dry.  Apply patch as indicated in monitor instructions. Patch will be placed under collarbone on left  side of chest with arrow pointing upward.  Rub patch adhesive wings for 2 minutes. Remove white label marked "1". Remove the white  label marked "2". Rub patch adhesive wings for 2 additional minutes.  While looking in a mirror, press and release button in center of patch. A small green light will  flash 3-4 times. This will be your only indicator that the monitor has been  turned on.  Do not shower for the first 24 hours. You may shower after the first 24 hours.  Press the button if you feel a symptom. You will hear a small click. Record Date, Time and  Symptom in the Patient Logbook.  When you are ready to remove the patch, follow instructions on the last 2 pages of Patient  Logbook. Stick patch monitor onto the last page of Patient Logbook.  Place Patient Logbook in the blue and white box. Use locking tab on box and tape box closed  securely. The blue and white box has prepaid postage on it. Please place it in the mailbox as  soon as possible. Your physician should have your test results approximately 7 days after the  monitor has been mailed back to [facility].  Call [place] at [phone] if you have questions regarding  your ZIO XT patch monitor. Call them immediately if you see an orange light blinking  on your  monitor.  If your monitor falls off in less than 4 days, contact our Monitor department at [phone].  If your monitor becomes loose or falls off after 4 days call [employer] at [phone] for  suggestions on securing your monitor    Signed, Gaston Islam  03/09/2021 12:54 PM    Ali Molina

## 2021-03-09 ENCOUNTER — Ambulatory Visit: Payer: Medicare Other | Admitting: Physician Assistant

## 2021-03-09 ENCOUNTER — Ambulatory Visit (INDEPENDENT_AMBULATORY_CARE_PROVIDER_SITE_OTHER): Payer: Medicare Other

## 2021-03-09 ENCOUNTER — Other Ambulatory Visit: Payer: Self-pay

## 2021-03-09 ENCOUNTER — Encounter: Payer: Self-pay | Admitting: Physician Assistant

## 2021-03-09 VITALS — BP 120/62 | HR 63 | Ht 62.0 in | Wt 123.0 lb

## 2021-03-09 DIAGNOSIS — I2510 Atherosclerotic heart disease of native coronary artery without angina pectoris: Secondary | ICD-10-CM

## 2021-03-09 DIAGNOSIS — I5020 Unspecified systolic (congestive) heart failure: Secondary | ICD-10-CM

## 2021-03-09 DIAGNOSIS — I493 Ventricular premature depolarization: Secondary | ICD-10-CM

## 2021-03-09 DIAGNOSIS — I480 Paroxysmal atrial fibrillation: Secondary | ICD-10-CM | POA: Diagnosis not present

## 2021-03-09 DIAGNOSIS — I10 Essential (primary) hypertension: Secondary | ICD-10-CM

## 2021-03-09 DIAGNOSIS — E7800 Pure hypercholesterolemia, unspecified: Secondary | ICD-10-CM | POA: Diagnosis not present

## 2021-03-09 MED ORDER — APIXABAN 2.5 MG PO TABS: 2.5000 mg | ORAL_TABLET | Freq: Two times a day (BID) | ORAL | 0 refills | Status: DC

## 2021-03-09 MED ORDER — CARVEDILOL 6.25 MG PO TABS: 6.2500 mg | ORAL_TABLET | Freq: Two times a day (BID) | ORAL | 3 refills | Status: DC

## 2021-03-09 MED ORDER — APIXABAN 2.5 MG PO TABS: 2.5000 mg | ORAL_TABLET | Freq: Two times a day (BID) | ORAL | 3 refills | Status: DC

## 2021-03-09 NOTE — Progress Notes (Unsigned)
Patient enrolled for [employer] to mail a 14 day ZIO XT monitor to his address on file.

## 2021-03-09 NOTE — Patient Instructions (Addendum)
Medication Instructions:   Medication changes  Increase  carvedilol 6.25 mg twice a day     Continue taking Eliquis 2.5 mg  twice  a day.  Please fill out  patient assistance  for Eliquis and bring back to the office  *If you need a refill on your cardiac medications before your next appointment, please call your pharmacy*   Lab Work: Not needed    Testing/Procedures: Will be mailed to you in 5 to 10 days  Your physician has recommended that you wear a holter monitor 14 days. Holter monitors are medical devices that record the heart's electrical activity. Doctors most often use these monitors to diagnose arrhythmias. Arrhythmias are problems with the speed or rhythm of the heartbeat. The monitor is a small, portable device. You can wear one while you do your normal daily activities. This is usually used to diagnose what is causing palpitations/syncope (passing out).    Follow-Up: At [facility], you and your health needs are our priority.  As part of our continuing mission to provide you with exceptional heart care, we have created designated Provider Care Teams.  These Care Teams include your primary Cardiologist (physician) and Advanced Practice Providers (APPs -  Physician Assistants and Nurse Practitioners) who all work together to provide you with the care you need, when you need it.     Your next appointment:   1 month(s)  COME FASTING THE MORNING OF APPT FOR LABS   The format for your next appointment:   In Person  Provider:   Caron Presume, PA-C   Other Instructions    Continue to monitor blood pressure - bring b/p reading to next appointment in 1 month    ZIO XT- Long Term Monitor Instructions  Your physician has requested you wear a ZIO patch monitor for 14 days.  This is a single patch monitor. [employer] supplies one patch monitor per enrollment. Additional stickers are not available. Please do not apply patch if you will be having a Nuclear Stress Test,   Echocardiogram, Cardiac CT, MRI, or Chest Xray during the period you would be wearing the  monitor. The patch cannot be worn during these tests. You cannot remove and re-apply the  ZIO XT patch monitor.  Your ZIO patch monitor will be mailed 3 day USPS to your address on file. It may take 3-5 days  to receive your monitor after you have been enrolled.  Once you have received your monitor, please review the enclosed instructions. Your monitor  has already been registered assigning a specific monitor serial # to you.  Billing and Patient Assistance Program Information  We have supplied [employer] with any of your insurance information on file for billing purposes. [employer] offers a sliding scale Patient Assistance Program for patients that do not have  insurance, or whose insurance does not completely cover the cost of the ZIO monitor.  You must apply for the Patient Assistance Program to qualify for this discounted rate.  To apply, please call [employer] at [phone], select option 4, select option 2, ask to apply for  Patient Assistance Program. Theodore Demark will ask your household income, and how many people  are in your household. They will quote your out-of-pocket cost based on that information.  [employer] will also be able to set up a 76-month, interest-free payment plan if needed.  Applying the monitor   Shave hair from upper left chest.  Hold abrader disc by orange tab. Rub abrader in 40 strokes over the upper  left chest as  indicated in your monitor instructions.  Clean area with 4 enclosed alcohol pads. Let dry.  Apply patch as indicated in monitor instructions. Patch will be placed under collarbone on left  side of chest with arrow pointing upward.  Rub patch adhesive wings for 2 minutes. Remove white label marked "1". Remove the white  label marked "2". Rub patch adhesive wings for 2 additional minutes.  While looking in a mirror, press and release button in center of patch. A small  green light will  flash 3-4 times. This will be your only indicator that the monitor has been turned on.  Do not shower for the first 24 hours. You may shower after the first 24 hours.  Press the button if you feel a symptom. You will hear a small click. Record Date, Time and  Symptom in the Patient Logbook.  When you are ready to remove the patch, follow instructions on the last 2 pages of Patient  Logbook. Stick patch monitor onto the last page of Patient Logbook.  Place Patient Logbook in the blue and white box. Use locking tab on box and tape box closed  securely. The blue and white box has prepaid postage on it. Please place it in the mailbox as  soon as possible. Your physician should have your test results approximately 7 days after the  monitor has been mailed back to [facility].  Call [place] at [phone] if you have questions regarding  your ZIO XT patch monitor. Call them immediately if you see an orange light blinking on your  monitor.  If your monitor falls off in less than 4 days, contact our Monitor department at [phone].  If your monitor becomes loose or falls off after 4 days call [employer] at [phone] for  suggestions on securing your monitor

## 2021-03-13 DIAGNOSIS — I493 Ventricular premature depolarization: Secondary | ICD-10-CM

## 2021-03-13 DIAGNOSIS — I480 Paroxysmal atrial fibrillation: Secondary | ICD-10-CM

## 2021-03-28 ENCOUNTER — Telehealth: Payer: Self-pay | Admitting: *Deleted

## 2021-03-28 NOTE — Telephone Encounter (Signed)
Patient assistance form for eliquis faxed to the company for 2023.

## 2021-04-03 ENCOUNTER — Telehealth: Payer: Self-pay

## 2021-04-03 DIAGNOSIS — I480 Paroxysmal atrial fibrillation: Secondary | ICD-10-CM | POA: Diagnosis not present

## 2021-04-03 DIAGNOSIS — I493 Ventricular premature depolarization: Secondary | ICD-10-CM | POA: Diagnosis not present

## 2021-04-03 NOTE — Telephone Encounter (Signed)
Patient walked into office today- advising that his BP has been high in the AM. His BP was 159/73 and after 20 minutes it was 141/66. He has been taking 0.5 tablet of his lisinopril. Due to our policy- we are unable to reach BP while in the office, patient advised that I would send a message over to Dr.Hochrein to see what he recommends to do- he states he thinks he should make a med adjustment based on his numbers being the same in the mornings.   I advised we would call him back with those recommendations.   Thanks!

## 2021-04-05 DIAGNOSIS — G4733 Obstructive sleep apnea (adult) (pediatric): Secondary | ICD-10-CM | POA: Diagnosis not present

## 2021-04-05 NOTE — Telephone Encounter (Signed)
Called patient, LVM advising of MD recommendations.  And call back number to report BP readings.

## 2021-04-07 ENCOUNTER — Telehealth: Payer: Self-pay

## 2021-04-07 NOTE — Telephone Encounter (Addendum)
Left message for patient to call office----- Message from Warren Lacy, PA-C sent at 04/07/2021  2:11 PM EST ----- Regarding: Refer to [place],  Can you refer Mr. Corron to see EP - Dr. Lars Mage for treatment of PVCs?  Thanks much.  Have a great weekend, Anderson [place]   ----- Message ----- From: Lelon Perla, MD Sent: 04/04/2021   1:38 PM EST To: Warren Lacy, PA-C  sure ----- Message ----- From: Warren Lacy, PA-C Sent: 04/04/2021   1:26 PM EST To: Lelon Perla, MD  Hey Dr. Stanford Breed, you read your patient's ZIO monitor.  His ventricular ectopy burden was 22% with isolated PVCs, ventricular bigeminy and trigeminy.  This was after I increased his Coreg.  He is on amio 200mg  daily with hx of A-fib (no a-fib on monitor).  He had a drop in EF to 40-45%.  Stated doesn't feel quite right. Are you okay if I refer him to EP for further management of frequent PVCs?

## 2021-04-11 ENCOUNTER — Encounter: Payer: Self-pay | Admitting: Physician Assistant

## 2021-04-12 ENCOUNTER — Telehealth: Payer: Self-pay

## 2021-04-12 NOTE — Telephone Encounter (Signed)
Left message for patient to advise referral for EP will be sent.

## 2021-04-12 NOTE — Telephone Encounter (Signed)
-----   Message from Warren Lacy, PA-C sent at 04/07/2021  2:11 PM EST ----- Regarding: Refer to EP [place],  Can you refer Mr. Shankel to see EP - Dr. Lars Mage for treatment of PVCs?  Thanks much.  Have a great weekend, Anderson [place]   ----- Message ----- From: Lelon Perla, MD Sent: 04/04/2021   1:38 PM EST To: Warren Lacy, PA-C  sure ----- Message ----- From: Warren Lacy, PA-C Sent: 04/04/2021   1:26 PM EST To: Lelon Perla, MD  Hey Dr. Stanford Breed, you read your patient's ZIO monitor.  His ventricular ectopy burden was 22% with isolated PVCs, ventricular bigeminy and trigeminy.  This was after I increased his Coreg.  He is on amio 200mg  daily with hx of A-fib (no a-fib on monitor).  He had a drop in EF to 40-45%.  Stated doesn't feel quite right. Are you okay if I refer him to EP for further management of frequent PVCs?

## 2021-04-26 ENCOUNTER — Telehealth: Payer: Self-pay

## 2021-04-26 DIAGNOSIS — I480 Paroxysmal atrial fibrillation: Secondary | ICD-10-CM

## 2021-04-26 NOTE — Telephone Encounter (Signed)
Returned call to patient regarding appointment. Left voicemail.

## 2021-04-26 NOTE — Telephone Encounter (Signed)
Patient called regarding referral to EP. Refferral sent

## 2021-05-03 ENCOUNTER — Ambulatory Visit: Payer: Medicare Other | Admitting: Physician Assistant

## 2021-05-04 ENCOUNTER — Other Ambulatory Visit: Payer: Self-pay

## 2021-05-04 ENCOUNTER — Encounter: Payer: Self-pay | Admitting: Cardiology

## 2021-05-04 ENCOUNTER — Ambulatory Visit: Payer: Medicare Other | Admitting: Cardiology

## 2021-05-04 VITALS — BP 100/50 | HR 68 | Ht 62.0 in | Wt 141.0 lb

## 2021-05-04 DIAGNOSIS — I2510 Atherosclerotic heart disease of native coronary artery without angina pectoris: Secondary | ICD-10-CM | POA: Diagnosis not present

## 2021-05-04 DIAGNOSIS — I493 Ventricular premature depolarization: Secondary | ICD-10-CM | POA: Insufficient documentation

## 2021-05-04 DIAGNOSIS — I10 Essential (primary) hypertension: Secondary | ICD-10-CM | POA: Diagnosis not present

## 2021-05-04 DIAGNOSIS — I480 Paroxysmal atrial fibrillation: Secondary | ICD-10-CM

## 2021-05-04 DIAGNOSIS — I5020 Unspecified systolic (congestive) heart failure: Secondary | ICD-10-CM | POA: Diagnosis not present

## 2021-05-04 MED ORDER — AMIODARONE HCL 200 MG PO TABS: ORAL_TABLET | ORAL | 3 refills | Status: DC

## 2021-05-04 MED ORDER — MEXILETINE HCL 150 MG PO CAPS: 150.0000 mg | ORAL_CAPSULE | Freq: Two times a day (BID) | ORAL | 0 refills | Status: DC

## 2021-05-04 NOTE — Progress Notes (Signed)
Electrophysiology Office Note:    Date:  05/04/2021   ID:  Brent Griffith, DOB January 07, 1936, MRN [id number]  PCP:  [name], NP  [facility] Cardiologist:  Kirk Ruths, MD  [facility] Electrophysiologist:  None   Referring MD: Warren Lacy, PA*   Chief Complaint: PVCs  History of Present Illness:    Brent Griffith is a 85 y.o. male who presents for an evaluation of PVCs at the request of Caron Presume, PA-C. Their medical history includes coronary artery disease post CABG, hypertension, hyperlipidemia, obstructive sleep apnea.  The patient was seen by Caron Presume March 09, 2021. He has previous experience atrial fibrillation as well.  For this he is on amiodarone and Eliquis 2.5 mg by mouth twice daily. The patient thinks that many of his symptoms of palpitations started after his COVID diagnosis.  At the appointment with Caron Presume, his Coreg was increased to 6.25 mg by mouth twice daily.  Zio patch was ordered to quantify the PVC burden. Today reports no syncope or presyncope.    Past Medical History:  Diagnosis Date   Acute gastric ulcer without mention of hemorrhage, perforation, or obstruction    Allergic rhinitis, cause unspecified    Anemia, unspecified    Arthritis    Asthma    Blood transfusion without reported diagnosis    CAD (coronary artery disease)    Diaphragmatic hernia without mention of obstruction or gangrene    Elevated prostate specific antigen (PSA)    Enlarged prostate    Esophageal reflux    Extrinsic asthma, unspecified    Herpes zoster without mention of complication    Hyperlipidemia    Hypersomnia with sleep apnea, unspecified    Impotence of organic origin    Kyphosis    Lumbago    Obstructive sleep apnea (adult) (pediatric)    Pneumonia    hx of years ago    Senile osteoporosis    Trigger finger (acquired)    Tubular adenoma of colon 05/2013   Unspecified essential hypertension    Unspecified sleep  apnea    CPAP- settings 4-12    Unspecified vitamin D deficiency     Past Surgical History:  Procedure Laterality Date   CARDIAC CATHETERIZATION  03/04/2009   Dr [name]   COLONOSCOPY     CORONARY ARTERY BYPASS GRAFT  2010   CYSTOSCOPY WITH RETROGRADE PYELOGRAM, URETEROSCOPY AND STENT PLACEMENT Right 10/04/2013   Procedure: CYSTOSCOPY WITH RETROGRADE PYELOGRAM,  AND STENT PLACEMENT;  Surgeon: Festus Aloe, MD;  Location: [facility];  Service: Urology;  Laterality: Right;   DENTAL SURGERY N/A    MOLE REMOVAL  1958   chin   ROTATOR CUFF REPAIR Left 04/1998   with bone spur removed, Dr French Ana   TONSILLECTOMY  1945   TRANSURETHRAL RESECTION OF PROSTATE N/A 09/24/2013   Procedure: TRANSURETHRAL RESECTION OF THE PROSTATE (TURP) WITH GYRUS (STAGED RIGHT LATERAL AND MEDIAN LOBE);  Surgeon: Ailene Rud, MD;  Location: [facility];  Service: Urology;  Laterality: N/A;   UPPER GI ENDOSCOPY      Current Medications: Current Meds  Medication Sig   acetaminophen (TYLENOL) 500 MG tablet Take 1,000 mg by mouth every 6 (six) hours as needed for mild pain or moderate pain.   albuterol (VENTOLIN HFA) 108 (90 Base) MCG/ACT inhaler USE 1 TO 2 INHALATIONS BY  MOUTH EVERY 6 HOURS AS  NEEDED FOR WHEEZING OR  SHORTNESS OF BREATH (Patient taking differently: Inhale 1-2 puffs into the lungs  every 6 (six) hours as needed for shortness of breath or wheezing.)   apixaban (ELIQUIS) 2.5 MG TABS tablet Take 1 tablet (2.5 mg total) by mouth 2 (two) times daily.   b complex vitamins tablet Take 1 tablet by mouth daily.   Calcium Citrate-Vitamin D (EQ CALCIUM CITRATE+D3 PO) Take 1,200 mg by mouth daily.   carvedilol (COREG) 6.25 MG tablet Take 1 tablet (6.25 mg total) by mouth 2 (two) times daily.   chlorpheniramine-HYDROcodone (TUSSIONEX PENNKINETIC ER) 10-8 MG/5ML SUER Take 5 mLs by mouth every 12 (twelve) hours as needed for cough.   Cholecalciferol (VITAMIN D-3) 5000 units TABS Take 10,000 Units by mouth  daily.   Coenzyme Q10 (COQ10) 200 MG CAPS Take 200 mg by mouth daily.   doxazosin (CARDURA) 2 MG tablet Take 1 tablet (2 mg total) by mouth daily.   finasteride (PROSCAR) 5 MG tablet TAKE 1 TABLET BY MOUTH  EVERY MORNING (Patient taking differently: Take 5 mg by mouth every morning.)   folic acid (FOLVITE) 1 MG tablet TAKE 1 TABLET BY MOUTH  DAILY   mexiletine (MEXITIL) 150 MG capsule Take 1 capsule (150 mg total) by mouth 2 (two) times daily.   Multiple Vitamin (MULTIVITAMIN) tablet Take 1 tablet by mouth daily.   pantoprazole (PROTONIX) 40 MG tablet Take 1 tablet (40 mg total) by mouth daily.   rosuvastatin (CRESTOR) 20 MG tablet TAKE 1 TABLET BY MOUTH ONCE DAILY FOR CHOLESTEROL   [DISCONTINUED] amiodarone (PACERONE) 200 MG tablet Take 1 tablet (200 mg total) by mouth daily.   [DISCONTINUED] lisinopril (ZESTRIL) 2.5 MG tablet Take 1 tablet (2.5 mg total) by mouth daily.     Allergies:   Compazine [prochlorperazine edisylate]   Social History   Socioeconomic History   Marital status: Married    Spouse name: Not on file   Number of children: 0   Years of education: Not on file   Highest education level: Not on file  Occupational History   Occupation: Surveyor, quantity  Tobacco Use   Smoking status: Never   Smokeless tobacco: Never  Vaping Use   Vaping Use: Never used  Substance and Sexual Activity   Alcohol use: No   Drug use: No   Sexual activity: Not Currently  Other Topics Concern   Not on file  Social History Narrative   Not on file   Social Determinants of Health   Financial Resource Strain: Not on file  Food Insecurity: Not on file  Transportation Needs: Not on file  Physical Activity: Not on file  Stress: Not on file  Social Connections: Not on file     Family History: The patient's family history includes Breast cancer in his mother; Cirrhosis in his mother; Heart disease in his father and paternal grandfather; Hypertension in his father; Kyphosis in  his mother; Rheumatic fever in his mother; Stroke in his father and mother. There is no history of Colon cancer.  ROS:   Please see the history of present illness.    All other systems reviewed and are negative.  EKGs/Labs/Other Studies Reviewed:    The following studies were reviewed today:  April 04, 2021 ZIO monitor personally reviewed 22% burden of PVCs 1 SVT lasting 6 beats 1 dominant morphology of PVC  January 19, 2021 echo Left ventricular function mildly decreased, 40 to 45% Hypokinesis in the inferior and distal lateral/apical walls  EKG:  The ekg ordered today demonstrates sinus rhythm with bigeminal monomorphic PVCs.  PVCs have a left superior  axis and are negative in V3 through V6.   Recent Labs: 01/19/2021: Magnesium 1.6; TSH 1.253 01/30/2021: ALT 21; BUN 23; Creatinine, Ser 1.19; Hemoglobin 12.0; Platelets 415; Potassium 4.3; Sodium 131  Recent Lipid Panel    Component Value Date/Time   CHOL 113 04/22/2020 1026   CHOL 111 07/04/2015 0811   TRIG 61 04/22/2020 1026   HDL 58 04/22/2020 1026   HDL 52 07/04/2015 0811   CHOLHDL 1.9 04/22/2020 1026   VLDL 12 10/05/2016 0812   LDLCALC 41 04/22/2020 1026    Physical Exam:    VS:  BP (!) 100/50    Pulse 68    Ht 5\' 2"  (1.575 m)    Wt 141 lb (64 kg)    SpO2 95%    BMI 25.79 kg/m     Wt Readings from Last 3 Encounters:  05/04/21 141 lb (64 kg)  03/09/21 123 lb (55.8 kg)  02/20/21 123 lb (55.8 kg)     GEN:  Well nourished, well developed in no acute distress.  Elderly appearing HEENT: Normal NECK: No JVD; No carotid bruits LYMPHATICS: No lymphadenopathy CARDIAC: Irregular rhythm, no murmurs, rubs, gallops RESPIRATORY:  Clear to auscultation without rales, wheezing or rhonchi  ABDOMEN: Soft, non-tender, non-distended MUSCULOSKELETAL:  No edema; No deformity  SKIN: Warm and dry NEUROLOGIC:  Alert and oriented x 3 PSYCHIATRIC:  Normal affect       ASSESSMENT:    1. HFrEF (heart failure with reduced  ejection fraction) ([place])   2. PVC's (premature ventricular contractions)   3. Essential hypertension   4. Coronary artery disease involving native coronary artery of native heart without angina pectoris   5. Paroxysmal atrial fibrillation (HCC)    PLAN:    In order of problems listed above:  #Chronic systolic heart failure #PVCs NYHA class II today.  Warm and euvolemic on exam.  I would continue the Coreg.  I suspect we do not have the blood pressure room to start ACE/ARB/ARN I.  Patient has very frequent PVCs that are largely monomorphic.  The PVCs appear to be originating from the apical inferoseptum.  Despite the patient's high burden of PVCs, I am not convinced that the PVCs are actually driving the left ventricular dysfunction.  The echo shows a regional wall motion abnormality which is consistent with his history of extensive coronary artery disease and bypass surgery.  Despite that, I would favor a trial of PVC suppression.  We will increase the amiodarone to 400 mg by mouth daily for 2 weeks and add mexiletine twice daily.  If this is unsuccessful in suppressing the PVC, I would discontinue the mexiletine at the follow-up appointment in 6 to 8 weeks.  I do not think he is a good candidate for catheter ablation because of his advanced age and lack of clear relationship between the PVC and left ventricular dysfunction.  #Hypertension He has relatively low blood pressures.  This limits our ability to uptitrate GDMT.  #Paroxysmal atrial fibrillation Maintaining sinus rhythm.  Continue Eliquis for stroke prophylaxis.   Follow-up in 6 to 8 weeks with APP.    Medication Adjustments/Labs and Tests Ordered: Current medicines are reviewed at length with the patient today.  Concerns regarding medicines are outlined above.  Orders Placed This Encounter  Procedures   EKG 12-Lead   Meds ordered this encounter  Medications   amiodarone (PACERONE) 200 MG tablet    Sig: Take 2 tablets  (400 mg total) by mouth daily for 14 days, THEN 1  tablet (200 mg total) daily.    Dispense:  104 tablet    Refill:  3   mexiletine (MEXITIL) 150 MG capsule    Sig: Take 1 capsule (150 mg total) by mouth 2 (two) times daily.    Dispense:  180 capsule    Refill:  0     Signed, Metta Koranda T. Quentin Ore, MD, [facility], [facility] 05/04/2021 8:49 PM    Electrophysiology [facility]

## 2021-05-04 NOTE — Patient Instructions (Addendum)
Medication Instructions:  Your physician has recommended you make the following change in your medication:    STOP lisinopril  2.    START taking mexiletine 150 mg-  Take one capsule by mouth TWICE a day  3.    Amiodarone 200 mg-   A.  You will increase your amiodarone to 2 tablets (400 mg) by mouth daily for 2 weeks  B.  THEN return to amiodarone 200 mg - one tablet by mouth daily   Lab Work: None ordered. If you have labs (blood work) drawn today and your tests are completely normal, you will receive your results only by: [place] (if you have MyChart) OR A paper copy in the mail If you have any lab test that is abnormal or we need to change your treatment, we will call you to review the results.  Testing/Procedures: None ordered.  Follow-Up: At [facility], you and your health needs are our priority.  As part of our continuing mission to provide you with exceptional heart care, we have created designated Provider Care Teams.  These Care Teams include your primary Cardiologist (physician) and Advanced Practice Providers (APPs -  Physician Assistants and Nurse Practitioners) who all work together to provide you with the care you need, when you need it.  Your next appointment:   Your physician wants you to follow-up in: 6-8 weeks with:  Tommye Standard, PA-C Beryle Beams" Peck, PA-C  Mexiletine capsules What is this medication? MEXILETINE (mex [place] e teen) is an antiarrhythmic agent. This medicine is used to treat irregular heart rhythm and can slow rapid heartbeats. It can help your heart to return to and maintain a normal rhythm. Because of the side effects caused by this medicine, it is usually used for heartbeat problems that may be life-threatening. This medicine may be used for other purposes; ask your health care provider or pharmacist if you have questions. COMMON BRAND NAME(S): Mexitil What should I tell my care team before I take this medication? They need to  know if you have any of these conditions: liver disease other heart problems previous heart attack an unusual or allergic reaction to mexiletine, other medicines, foods, dyes, or preservatives pregnant or trying to get pregnant breast-feeding How should I use this medication? Take this medicine by mouth with a glass of water. Follow the directions on the prescription label. It is recommended that you take this medicine with food or an antacid. Take your doses at regular intervals. Do not take your medicine more often than directed. Do not stop taking except on the advice of your doctor or health care professional. Talk to your pediatrician regarding the use of this medicine in children. Special care may be needed. Overdosage: If you think you have taken too much of this medicine contact a poison control center or emergency room at once. NOTE: This medicine is only for you. Do not share this medicine with others. What if I miss a dose? If you miss a dose, take it as soon as you can. If it is almost time for your next dose, take only that dose. Do not take double or extra doses. What may interact with this medication? Do not take this medicine with any of the following medications: dofetilide This medicine may also interact with the following medications: caffeine cimetidine medicines for depression, anxiety, or psychotic disturbances medicines to control heart rhythm phenobarbital phenytoin rifampin theophylline This list may not describe all possible interactions. Give your health care provider a list  of all the medicines, herbs, non-prescription drugs, or dietary supplements you use. Also tell them if you smoke, drink alcohol, or use illegal drugs. Some items may interact with your medicine. What should I watch for while using this medication? Your condition will be monitored closely when you first begin therapy. Often, this drug is first started in a hospital or other monitored health  care setting. Once you are on maintenance therapy, visit your doctor or health care provider for regular checks on your progress. Because your condition and use of this medicine carry some risk, it is a good idea to carry an identification card, necklace or bracelet with details of your condition, medications, and doctor or health care provider. You may get drowsy or dizzy. Do not drive, use machinery, or do anything that needs mental alertness until you know how this medicine affects you. Do not stand or sit up quickly, especially if you are an older patient. This reduces the risk of dizzy or fainting spells. Alcohol can make you more dizzy, increase flushing and rapid heartbeats. Avoid alcoholic drinks. This medicine may cause serious skin reactions. They can happen weeks to months after starting the medicine. Contact your health care provider right away if you notice fevers or flu-like symptoms with a rash. The rash may be red or purple and then turn into blisters or peeling of the skin. Or, you might notice a red rash with swelling of the face, lips or lymph nodes in your neck or under your arms. What side effects may I notice from receiving this medication? Side effects that you should report to your doctor or health care professional as soon as possible: allergic reactions like skin rash, itching or hives, swelling of the face, lips, or tongue breathing problems chest pain, continued irregular heartbeats rash, fever, and swollen lymph nodes redness, blistering, peeling or loosening of the skin, including inside the mouth seizures skin rash trembling, shaking unusual bleeding or bruising unusually weak or tired Side effects that usually do not require medical attention (report to your doctor or health care professional if they continue or are bothersome): blurred vision difficulty walking heartburn nausea, vomiting nervousness numbness, or tingling in the fingers or toes This list may not  describe all possible side effects. Call your doctor for medical advice about side effects. You may report side effects to FDA at 1-800-FDA-1088. Where should I keep my medication? Keep out of reach of children. Store at room temperature between 15 and 30 degrees C (59 and 86 degrees F). Throw away any unused medicine after the expiration date. NOTE: This sheet is a summary. It may not cover all possible information. If you have questions about this medicine, talk to your doctor, pharmacist, or health care provider.  2022 [employer] (2018-07-31 00:00:00)

## 2021-05-05 ENCOUNTER — Encounter: Payer: Self-pay | Admitting: Nurse Practitioner

## 2021-05-05 ENCOUNTER — Ambulatory Visit (INDEPENDENT_AMBULATORY_CARE_PROVIDER_SITE_OTHER): Payer: Medicare Other | Admitting: Nurse Practitioner

## 2021-05-05 ENCOUNTER — Telehealth: Payer: Self-pay

## 2021-05-05 VITALS — BP 112/62 | HR 63 | Temp 96.9°F | Ht 62.0 in | Wt 138.0 lb

## 2021-05-05 DIAGNOSIS — I5020 Unspecified systolic (congestive) heart failure: Secondary | ICD-10-CM

## 2021-05-05 DIAGNOSIS — K219 Gastro-esophageal reflux disease without esophagitis: Secondary | ICD-10-CM

## 2021-05-05 DIAGNOSIS — D126 Benign neoplasm of colon, unspecified: Secondary | ICD-10-CM

## 2021-05-05 DIAGNOSIS — I2510 Atherosclerotic heart disease of native coronary artery without angina pectoris: Secondary | ICD-10-CM | POA: Diagnosis not present

## 2021-05-05 DIAGNOSIS — I10 Essential (primary) hypertension: Secondary | ICD-10-CM | POA: Diagnosis not present

## 2021-05-05 DIAGNOSIS — G4733 Obstructive sleep apnea (adult) (pediatric): Secondary | ICD-10-CM

## 2021-05-05 DIAGNOSIS — N401 Enlarged prostate with lower urinary tract symptoms: Secondary | ICD-10-CM

## 2021-05-05 MED ORDER — BENZONATATE 100 MG PO CAPS: 100.0000 mg | ORAL_CAPSULE | Freq: Three times a day (TID) | ORAL | 0 refills | Status: DC | PRN

## 2021-05-05 NOTE — Telephone Encounter (Signed)
Pended medication for tessalon pearls he can take 3 times daily as needed for cough. (Several pharmacies on file) If he is having cough with congestion would recommend mucinex DM by mouth twice daily.

## 2021-05-05 NOTE — Telephone Encounter (Signed)
Patient was seen in office today and after appointment patient had an additional question for [name], NP. Patient is questioning if he can have a rx for a non-narcotic cough medication (states his wife has a rx for one). Patient with lingering cough off/on.  Please advise

## 2021-05-05 NOTE — Patient Instructions (Signed)
-  encouraged to elevate legs above level of heart as tolerates, low sodium diet, compression hose as tolerates (on in am, off in pm)  

## 2021-05-05 NOTE — Telephone Encounter (Signed)
Call returned to patient and/or family member, a detailed message was left with the providers reply. [facility] as [facility] stated [facility] usually [facility].

## 2021-05-05 NOTE — Progress Notes (Signed)
Careteam: Patient Care Team: [name], NP as PCP - General (Geriatric Medicine) Stanford Breed Denice Bors, MD as PCP - Cardiology (Cardiology) [name], MD as Consulting Physician (Sports Medicine)  PLACE OF SERVICE:  [place] Directive information Does Patient Have a Medical Advance Directive?: No, Would patient like information on creating a medical advance directive?: Yes (MAU/Ambulatory/Procedural Areas - Information given)  Allergies  Allergen Reactions   Compazine [Prochlorperazine Edisylate] Anxiety    Chief Complaint  Patient presents with   Medical Management of Chronic Issues    6 month follow-up and discuss need for colonoscopy.      HPI: Patient is a 85 y.o. male here for routine follow up   Last colonoscopy was 2015, tubular adenoma noted, he has not followed up but would like to.   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 He reports he can not feel this.  Cardiologist increased amiodarone to 400 mg by mouth daily for 2 weeks and added mexiletine 150 mg twice daily. He is then to follow up with cardiology to re-evaluate PVCs.   Feet and ankles started swelling about 3 weeks ago.  No shortness of breath.   He had COVID in September and was hospitalized with a fib. This was new onset afib now on eliquis and coreg.   Bph- controlled on cardura (recently decreased from 4 mg- 2 mg) and flomax   Hyperlipidemia- continues on crestor 20 mg daily   GERD- well controlled on protonix.   Has gained weight, eating better.   Review of Systems:  Review of Systems  Constitutional:  Negative for chills, fever and weight loss.  HENT:  Negative for tinnitus.   Respiratory:  Negative for cough, sputum production and shortness of breath.   Cardiovascular:  Positive for leg swelling. Negative for chest pain and palpitations.  Gastrointestinal:  Negative for abdominal pain, constipation, diarrhea and heartburn.  Genitourinary:  Negative for  dysuria, frequency and urgency.  Musculoskeletal:  Negative for back pain, falls, joint pain and myalgias.  Skin: Negative.   Neurological:  Negative for dizziness and headaches.  Psychiatric/Behavioral:  Negative for depression and memory loss. The patient does not have insomnia.    Past Medical History:  Diagnosis Date   Acute gastric ulcer without mention of hemorrhage, perforation, or obstruction    Allergic rhinitis, cause unspecified    Anemia, unspecified    Arthritis    Asthma    Blood transfusion without reported diagnosis    CAD (coronary artery disease)    Diaphragmatic hernia without mention of obstruction or gangrene    Elevated prostate specific antigen (PSA)    Enlarged prostate    Esophageal reflux    Extrinsic asthma, unspecified    Herpes zoster without mention of complication    Hyperlipidemia    Hypersomnia with sleep apnea, unspecified    Impotence of organic origin    Kyphosis    Lumbago    Obstructive sleep apnea (adult) (pediatric)    Pneumonia    hx of years ago    Senile osteoporosis    Trigger finger (acquired)    Tubular adenoma of colon 05/2013   Unspecified essential hypertension    Unspecified sleep apnea    CPAP- settings 4-12    Unspecified vitamin D deficiency    Past Surgical History:  Procedure Laterality Date   CARDIAC CATHETERIZATION  03/04/2009   Dr [name]   COLONOSCOPY     CORONARY ARTERY BYPASS GRAFT  2010  CYSTOSCOPY WITH RETROGRADE PYELOGRAM, URETEROSCOPY AND STENT PLACEMENT Right 10/04/2013   Procedure: CYSTOSCOPY WITH RETROGRADE PYELOGRAM,  AND STENT PLACEMENT;  Surgeon: Festus Aloe, MD;  Location: [facility];  Service: Urology;  Laterality: Right;   DENTAL SURGERY N/A    MOLE REMOVAL  1958   chin   ROTATOR CUFF REPAIR Left 04/1998   with bone spur removed, Dr French Ana   TONSILLECTOMY  1945   TRANSURETHRAL RESECTION OF PROSTATE N/A 09/24/2013   Procedure: TRANSURETHRAL RESECTION OF THE PROSTATE (TURP) WITH GYRUS  (STAGED RIGHT LATERAL AND MEDIAN LOBE);  Surgeon: Ailene Rud, MD;  Location: [facility];  Service: Urology;  Laterality: N/A;   UPPER GI ENDOSCOPY     Social History:   reports that he has never smoked. He has never used smokeless tobacco. He reports that he does not drink alcohol and does not use drugs.  Family History  Problem Relation Age of Onset   Stroke Mother    Breast cancer Mother    Cirrhosis Mother        wine   Rheumatic fever Mother    Kyphosis Mother    Hypertension Father    Stroke Father    Heart disease Father    Heart disease Paternal Grandfather    Colon cancer Neg Hx     Medications: Patient's Medications  New Prescriptions   No medications on file  Previous Medications   ACETAMINOPHEN (TYLENOL) 500 MG TABLET    Take 1,000 mg by mouth every 6 (six) hours as needed for mild pain or moderate pain.   ALBUTEROL (VENTOLIN HFA) 108 (90 BASE) MCG/ACT INHALER    USE 1 TO 2 INHALATIONS BY  MOUTH EVERY 6 HOURS AS  NEEDED FOR WHEEZING OR  SHORTNESS OF BREATH   AMIODARONE (PACERONE) 200 MG TABLET    Take 2 tablets (400 mg total) by mouth daily for 14 days, THEN 1 tablet (200 mg total) daily.   APIXABAN (ELIQUIS) 2.5 MG TABS TABLET    Take 1 tablet (2.5 mg total) by mouth 2 (two) times daily.   B COMPLEX VITAMINS TABLET    Take 1 tablet by mouth daily.   CALCIUM CITRATE-VITAMIN D (EQ CALCIUM CITRATE+D3 PO)    Take 1,200 mg by mouth daily.   CARVEDILOL (COREG) 6.25 MG TABLET    Take 1 tablet (6.25 mg total) by mouth 2 (two) times daily.   CHLORPHENIRAMINE-HYDROCODONE (TUSSIONEX PENNKINETIC ER) 10-8 MG/5ML SUER    Take 5 mLs by mouth every 12 (twelve) hours as needed for cough.   CHOLECALCIFEROL (VITAMIN D-3) 5000 UNITS TABS    Take 10,000 Units by mouth daily.   COENZYME Q10 (COQ10) 200 MG CAPS    Take 200 mg by mouth daily.   DOXAZOSIN (CARDURA) 2 MG TABLET    Take 1 tablet (2 mg total) by mouth daily.   FEXOFENADINE (ALLEGRA) 180 MG TABLET    Take 180 mg by mouth  daily as needed for allergies.   FINASTERIDE (PROSCAR) 5 MG TABLET    TAKE 1 TABLET BY MOUTH  EVERY MORNING   FOLIC ACID (FOLVITE) 1 MG TABLET    TAKE 1 TABLET BY MOUTH  DAILY   MEXILETINE (MEXITIL) 150 MG CAPSULE    Take 1 capsule (150 mg total) by mouth 2 (two) times daily.   MULTIPLE VITAMIN (MULTIVITAMIN) TABLET    Take 1 tablet by mouth daily.   ONDANSETRON (ZOFRAN) 4 MG TABLET    Take 1 tablet (4 mg total) by mouth  every 6 (six) hours.   PANTOPRAZOLE (PROTONIX) 40 MG TABLET    Take 1 tablet (40 mg total) by mouth daily.   ROSUVASTATIN (CRESTOR) 20 MG TABLET    TAKE 1 TABLET BY MOUTH ONCE DAILY FOR CHOLESTEROL  Modified Medications   No medications on file  Discontinued Medications   FAMOTIDINE (PEPCID) 20 MG TABLET    Take 1 tablet (20 mg total) by mouth daily.    Physical Exam:  Vitals:   05/05/21 1358  BP: 112/62  Pulse: 63  Temp: (!) 96.9 F (36.1 C)  TempSrc: Temporal  SpO2: 98%  Weight: 138 lb (62.6 kg)  Height: 5' 2"  (1.575 m)   Body mass index is 25.24 kg/m. Wt Readings from Last 3 Encounters:  05/05/21 138 lb (62.6 kg)  05/04/21 141 lb (64 kg)  03/09/21 123 lb (55.8 kg)    Physical Exam Constitutional:      General: He is not in acute distress.    Appearance: He is well-developed. He is not diaphoretic.  HENT:     Head: Normocephalic and atraumatic.     Right Ear: External ear normal.     Left Ear: External ear normal.     Mouth/Throat:     Pharynx: No oropharyngeal exudate.  Eyes:     Conjunctiva/sclera: Conjunctivae normal.     Pupils: Pupils are equal, round, and reactive to light.  Cardiovascular:     Rate and Rhythm: Normal rate and regular rhythm. FrequentExtrasystoles are present.    Heart sounds: Normal heart sounds.  Pulmonary:     Effort: Pulmonary effort is normal.     Breath sounds: Normal breath sounds.  Abdominal:     General: Bowel sounds are normal.     Palpations: Abdomen is soft.  Musculoskeletal:        General: No tenderness.      Cervical back: Normal range of motion and neck supple.     Right lower leg: 1+ Edema present.     Left lower leg: 1+ Edema present.  Skin:    General: Skin is warm and dry.  Neurological:     Mental Status: He is alert and oriented to person, place, and time.    Labs reviewed: Basic Metabolic Panel: Recent Labs    01/19/21 0040 01/27/21 2225 01/30/21 1820  NA 138 133* 131*  K 3.4* 4.1 4.3  CL 105 102 99  CO2 26 22 24   GLUCOSE 112* 127* 112*  BUN 22 25* 23  CREATININE 1.15 1.36* 1.19  CALCIUM 8.4* 8.5* 9.0  MG 1.6*  --   --   TSH 1.253  --   --    Liver Function Tests: Recent Labs    01/14/21 1256 01/16/21 0847 01/30/21 1820  AST 26 55* 23  ALT 20 34 21  ALKPHOS 44 45 41  BILITOT 1.6* 1.7* 0.9  PROT 6.8 6.7 6.6  ALBUMIN 3.7 3.7 3.7   No results for input(s): LIPASE, AMYLASE in the last 8760 hours. No results for input(s): AMMONIA in the last 8760 hours. CBC: Recent Labs    01/16/21 0847 01/18/21 1310 01/19/21 0040 01/27/21 2225 01/30/21 1820  WBC 6.2 6.4 6.0 7.0 9.9  NEUTROABS 4.9 4.7  --   --  6.5  HGB 11.5* 11.8* 11.6* 10.8* 12.0*  HCT 34.8* 35.0* 34.0* 31.3* 35.3*  MCV 90.6 87.9 89.2 86.9 88.0  PLT 131* 149* 147* 239 415*   Lipid Panel: No results for input(s): CHOL, HDL, LDLCALC, TRIG, CHOLHDL, LDLDIRECT  in the last 8760 hours. TSH: Recent Labs    01/19/21 0040  TSH 1.253   A1C: Lab Results  Component Value Date   HGBA1C 5.6 07/28/2012     Assessment/Plan 1. HFrEF (heart failure with reduced ejection fraction) ([place]) -continues to follow up with cardiology, euvolemic today, continues on coreg  2. Coronary artery disease involving native coronary artery of native heart without angina pectoris -stable, continues on coreg - CBC with Differential/Platelet - CMP with eGFR([facility]) - Lipid panel  3. Essential hypertension -well controlled. Regimen managed by cardiology at this time due to frequent PVC and rate management.  - CBC with  Differential/Platelet - CMP with eGFR([facility])  4. Obstructive sleep apnea -continues on cpap  5. Benign prostatic hyperplasia with lower urinary tract symptoms, symptom details unspecified -controlled on doxazosin and proscar   6. Gastroesophageal reflux disease, unspecified whether esophagitis present -stable on protonix  7. Adenomatous polyp of colon, unspecified part of colon -noted in 2015, will follow up with GI at this time.  - Ambulatory referral to Gastroenterology   Next appt: 4 months.  Carlos American. [place], [facility] [phone]

## 2021-05-06 LAB — CBC WITH DIFFERENTIAL/PLATELET
Absolute Monocytes: 534 cells/uL (ref 200–950)
Basophils Absolute: 22 cells/uL (ref 0–200)
Basophils Relative: 0.4 %
Eosinophils Absolute: 50 cells/uL (ref 15–500)
Eosinophils Relative: 0.9 %
HCT: 27.7 % — ABNORMAL LOW (ref 38.5–50.0)
Hemoglobin: 9.5 g/dL — ABNORMAL LOW (ref 13.2–17.1)
Lymphs Abs: 935 cells/uL (ref 850–3900)
MCH: 31.9 pg (ref 27.0–33.0)
MCHC: 34.3 g/dL (ref 32.0–36.0)
MCV: 93 fL (ref 80.0–100.0)
MPV: 11.8 fL (ref 7.5–12.5)
Monocytes Relative: 9.7 %
Neutro Abs: 3960 cells/uL (ref 1500–7800)
Neutrophils Relative %: 72 %
Platelets: 191 10*3/uL (ref 140–400)
RBC: 2.98 10*6/uL — ABNORMAL LOW (ref 4.20–5.80)
RDW: 12 % (ref 11.0–15.0)
Total Lymphocyte: 17 %
WBC: 5.5 10*3/uL (ref 3.8–10.8)

## 2021-05-06 LAB — COMPLETE METABOLIC PANEL WITH GFR
AG Ratio: 1.7 (calc) (ref 1.0–2.5)
ALT: 23 U/L (ref 9–46)
AST: 21 U/L (ref 10–35)
Albumin: 3.8 g/dL (ref 3.6–5.1)
Alkaline phosphatase (APISO): 50 U/L (ref 35–144)
BUN/Creatinine Ratio: 18 (calc) (ref 6–22)
BUN: 23 mg/dL (ref 7–25)
CO2: 22 mmol/L (ref 20–32)
Calcium: 8.8 mg/dL (ref 8.6–10.3)
Chloride: 109 mmol/L (ref 98–110)
Creat: 1.31 mg/dL — ABNORMAL HIGH (ref 0.70–1.22)
Globulin: 2.2 g/dL (calc) (ref 1.9–3.7)
Glucose, Bld: 95 mg/dL (ref 65–99)
Potassium: 4.4 mmol/L (ref 3.5–5.3)
Sodium: 140 mmol/L (ref 135–146)
Total Bilirubin: 0.8 mg/dL (ref 0.2–1.2)
Total Protein: 6 g/dL — ABNORMAL LOW (ref 6.1–8.1)
eGFR: 54 mL/min/{1.73_m2} — ABNORMAL LOW (ref >=60)

## 2021-05-06 LAB — LIPID PANEL
Cholesterol: 107 mg/dL (ref <200)
HDL: 57 mg/dL (ref >=40)
LDL Cholesterol (Calc): 38 mg/dL (calc)
Non-HDL Cholesterol (Calc): 50 mg/dL (calc) (ref <130)
Total CHOL/HDL Ratio: 1.9 (calc) (ref <5.0)
Triglycerides: 42 mg/dL (ref <150)

## 2021-05-17 ENCOUNTER — Other Ambulatory Visit: Payer: Self-pay

## 2021-05-17 ENCOUNTER — Other Ambulatory Visit: Payer: Medicare Other

## 2021-05-17 DIAGNOSIS — D508 Other iron deficiency anemias: Secondary | ICD-10-CM | POA: Diagnosis not present

## 2021-05-17 LAB — CBC WITH DIFFERENTIAL/PLATELET
Absolute Monocytes: 694 cells/uL (ref 200–950)
Basophils Absolute: 21 cells/uL (ref 0–200)
Basophils Relative: 0.4 %
Eosinophils Absolute: 42 cells/uL (ref 15–500)
Eosinophils Relative: 0.8 %
HCT: 29 % — ABNORMAL LOW (ref 38.5–50.0)
Hemoglobin: 10 g/dL — ABNORMAL LOW (ref 13.2–17.1)
Lymphs Abs: 853 cells/uL (ref 850–3900)
MCH: 31.3 pg (ref 27.0–33.0)
MCHC: 34.5 g/dL (ref 32.0–36.0)
MCV: 90.9 fL (ref 80.0–100.0)
MPV: 11.4 fL (ref 7.5–12.5)
Monocytes Relative: 13.1 %
Neutro Abs: 3689 cells/uL (ref 1500–7800)
Neutrophils Relative %: 69.6 %
Platelets: 211 10*3/uL (ref 140–400)
RBC: 3.19 10*6/uL — ABNORMAL LOW (ref 4.20–5.80)
RDW: 12.4 % (ref 11.0–15.0)
Total Lymphocyte: 16.1 %
WBC: 5.3 10*3/uL (ref 3.8–10.8)

## 2021-05-22 NOTE — Progress Notes (Addendum)
Cardiology Office Note:    Date:  05/23/2021   ID:  [name], DOB [birth year], MRN [id number]  PCP:  [name], NP [place] Cardiologist: Kirk Ruths, MD   Reason for visit: Follow-up  History of Present Illness:    ITHIEL LIEBLER is a 86 y.o. male with a hx of coronary artery disease status post CABG, hypertension, hyperlipidemia, anemia, asthma, OSA on CPAP and atrial fibrillation in September 2022 in the setting of COVID-19.   Echocardiogram 01/19/2021 showed LVEF 40-45%, normal RV function, mildly elevated pulmonary artery systolic pressure, mild mitral valve regurgitation, and mild aortic valve regurgitation.  I saw him in November 2022.  Patient stated he felt not quite right.  EKG showed ventricular bigeminy.  Zio patch ordered and showed ventricular ectopy burden was 22% with isolated PVCs, ventricular bigeminy and trigeminy.  He then was referred to EP and saw Dr. Lars Mage.  Patient was noted to have very frequent PVCs largely monomorphic.  His amiodarone was increased to 400 mg daily for 2 weeks and mexiletine was added.  He was thought not a good candidate for catheter ablation because of his advanced age and lack of clear relationship between the PVC burden and left ventricular dysfunction.  Planned for f/u in 6-8 weeks.  Today, he comes in with his wife.  Overall doing okay.  His heart rhythm has improved with addition of mexiletine and increased dose of carvedilol.  He can tell this by looking at his pulse oximeter.  Denies palpitations.  He states his lisinopril was recently discontinued when his systolic blood pressure of 88.  His blood pressure at home in the mornings is now 130s to 140s.  He complains about lower extremity edema below the knees x3 weeks.  His wife thinks it may be due to the high salt intake with canned foods.  He is not on any diuretics and not using compression stockings.  She also mentions they are not very mobile.  They are  planning to join the [facility] which he has free membership to.  He denies chest pain, shortness of breath, PND, orthopnea, syncope and bleeding issues.  He is compliant with CPAP use.     Past Medical History:  Diagnosis Date   Acute gastric ulcer without mention of hemorrhage, perforation, or obstruction    Allergic rhinitis, cause unspecified    Anemia, unspecified    Arthritis    Asthma    Blood transfusion without reported diagnosis    CAD (coronary artery disease)    Diaphragmatic hernia without mention of obstruction or gangrene    Elevated prostate specific antigen (PSA)    Enlarged prostate    Esophageal reflux    Extrinsic asthma, unspecified    Herpes zoster without mention of complication    Hyperlipidemia    Hypersomnia with sleep apnea, unspecified    Impotence of organic origin    Kyphosis    Lumbago    Obstructive sleep apnea (adult) (pediatric)    Pneumonia    hx of years ago    Senile osteoporosis    Trigger finger (acquired)    Tubular adenoma of colon 05/2013   Unspecified essential hypertension    Unspecified sleep apnea    CPAP- settings 4-12    Unspecified vitamin D deficiency     Past Surgical History:  Procedure Laterality Date   CARDIAC CATHETERIZATION  03/04/2009   Dr [name]   COLONOSCOPY     CORONARY ARTERY BYPASS GRAFT  2010   CYSTOSCOPY WITH RETROGRADE PYELOGRAM, URETEROSCOPY AND STENT PLACEMENT Right 10/04/2013   Procedure: CYSTOSCOPY WITH RETROGRADE PYELOGRAM,  AND STENT PLACEMENT;  Surgeon: Festus Aloe, MD;  Location: [facility];  Service: Urology;  Laterality: Right;   DENTAL SURGERY N/A    MOLE REMOVAL  1958   chin   ROTATOR CUFF REPAIR Left 04/1998   with bone spur removed, Dr French Ana   TONSILLECTOMY  1945   TRANSURETHRAL RESECTION OF PROSTATE N/A 09/24/2013   Procedure: TRANSURETHRAL RESECTION OF THE PROSTATE (TURP) WITH GYRUS (STAGED RIGHT LATERAL AND MEDIAN LOBE);  Surgeon: Ailene Rud, MD;  Location: [facility];   Service: Urology;  Laterality: N/A;   UPPER GI ENDOSCOPY      Current Medications: Current Meds  Medication Sig   acetaminophen (TYLENOL) 500 MG tablet Take 1,000 mg by mouth every 6 (six) hours as needed for mild pain or moderate pain.   albuterol (VENTOLIN HFA) 108 (90 Base) MCG/ACT inhaler USE 1 TO 2 INHALATIONS BY  MOUTH EVERY 6 HOURS AS  NEEDED FOR WHEEZING OR  SHORTNESS OF BREATH   amiodarone (PACERONE) 200 MG tablet Take 2 tablets (400 mg total) by mouth daily for 14 days, THEN 1 tablet (200 mg total) daily.   apixaban (ELIQUIS) 2.5 MG TABS tablet Take 1 tablet (2.5 mg total) by mouth 2 (two) times daily.   b complex vitamins tablet Take 1 tablet by mouth daily.   benzonatate (TESSALON) 100 MG capsule Take 1 capsule (100 mg total) by mouth 3 (three) times daily as needed for cough.   Calcium Citrate-Vitamin D (EQ CALCIUM CITRATE+D3 PO) Take 1,200 mg by mouth daily.   carvedilol (COREG) 6.25 MG tablet Take 1 tablet (6.25 mg total) by mouth 2 (two) times daily.   chlorpheniramine-HYDROcodone (TUSSIONEX PENNKINETIC ER) 10-8 MG/5ML SUER Take 5 mLs by mouth every 12 (twelve) hours as needed for cough.   Cholecalciferol (VITAMIN D-3) 5000 units TABS Take 10,000 Units by mouth daily.   Coenzyme Q10 (COQ10) 200 MG CAPS Take 200 mg by mouth daily.   doxazosin (CARDURA) 2 MG tablet Take 1 tablet (2 mg total) by mouth daily.   empagliflozin (JARDIANCE) 10 MG TABS tablet Take 1 tablet (10 mg total) by mouth daily before breakfast.   fexofenadine (ALLEGRA) 180 MG tablet Take 180 mg by mouth daily as needed for allergies.   finasteride (PROSCAR) 5 MG tablet TAKE 1 TABLET BY MOUTH  EVERY MORNING   folic acid (FOLVITE) 1 MG tablet TAKE 1 TABLET BY MOUTH  DAILY   furosemide (LASIX) 20 MG tablet Take 1 tablet (20 mg total) by mouth as needed.   mexiletine (MEXITIL) 150 MG capsule Take 1 capsule (150 mg total) by mouth 2 (two) times daily.   Multiple Vitamin (MULTIVITAMIN) tablet Take 1 tablet by mouth  daily.   ondansetron (ZOFRAN) 4 MG tablet Take 1 tablet (4 mg total) by mouth every 6 (six) hours.   pantoprazole (PROTONIX) 40 MG tablet Take 1 tablet (40 mg total) by mouth daily.   rosuvastatin (CRESTOR) 20 MG tablet TAKE 1 TABLET BY MOUTH ONCE DAILY FOR CHOLESTEROL     Allergies:   Compazine [prochlorperazine edisylate]   Social History   Socioeconomic History   Marital status: Married    Spouse name: Not on file   Number of children: 0   Years of education: Not on file   Highest education level: Not on file  Occupational History   Occupation: Surveyor, quantity  Tobacco Use  Smoking status: Never   Smokeless tobacco: Never  Vaping Use   Vaping Use: Never used  Substance and Sexual Activity   Alcohol use: No   Drug use: No   Sexual activity: Not Currently  Other Topics Concern   Not on file  Social History Narrative   Not on file   Social Determinants of Health   Financial Resource Strain: Not on file  Food Insecurity: Not on file  Transportation Needs: Not on file  Physical Activity: Not on file  Stress: Not on file  Social Connections: Not on file     Family History: The patient's family history includes Breast cancer in his mother; Cirrhosis in his mother; Heart disease in his father and paternal grandfather; Hypertension in his father; Kyphosis in his mother; Rheumatic fever in his mother; Stroke in his father and mother. There is no history of Colon cancer.  ROS:   Please see the history of present illness.     EKGs/Labs/Other Studies Reviewed:    EKG:  The ekg ordered today demonstrates normal sinus rhythm with incomplete left bundle branch block, artifact, heart 59, PR interval 184 ms, QRS duration 114 ms.  Recent Labs: 01/19/2021: Magnesium 1.6; TSH 1.253 05/05/2021: ALT 23; BUN 23; Creat 1.31; Potassium 4.4; Sodium 140 05/17/2021: Hemoglobin 10.0; Platelets 211   Recent Lipid Panel Lab Results  Component Value Date/Time   CHOL 107  05/05/2021 02:14 PM   CHOL 111 07/04/2015 08:11 AM   TRIG 42 05/05/2021 02:14 PM   HDL 57 05/05/2021 02:14 PM   HDL 52 07/04/2015 08:11 AM   LDLCALC 38 05/05/2021 02:14 PM    Physical Exam:    VS:  BP 130/72    Pulse (!) 58    Ht 5' 2"  (1.575 m)    Wt 134 lb 12.8 oz (61.1 kg)    SpO2 97%    BMI 24.66 kg/m    No data found.  Wt Readings from Last 3 Encounters:  05/23/21 134 lb 12.8 oz (61.1 kg)  05/05/21 138 lb (62.6 kg)  05/04/21 141 lb (64 kg)     GEN:  Well nourished, well developed in no acute distress, pronounced scolosis HEENT: Normal NECK: No JVD; No carotid bruits CARDIAC: RRR, no murmurs, rubs, gallops RESPIRATORY:  Clear to auscultation without rales, wheezing or rhonchi  ABDOMEN: Soft, non-tender, non-distended MUSCULOSKELETAL: 2+ LE edema bilateral below knees, pitting SKIN: Warm and dry NEUROLOGIC:  Alert and oriented PSYCHIATRIC:  Normal affect     ASSESSMENT AND PLAN   Acute on chronic systolic heart failure, hypervolemic -Echocardiogram 01/19/2021 showed LVEF 40-45% (EF 50-55% in 2015) -Continue BB.  ACE discontinued with recent soft pressures. -Add SGLT2 - Jardiance.  Patient denies history of UTIs/yeast infections.   -Referred to Berlinda Last with [facility] nutrition and diabetic education services to help educate on salt restriction.  (His wife sees this dietician already.) -Prescribed Lasix 20 mg daily x3 days and then as needed for lower extremity edema.  Hoping with initiation of Jardiance and better salt restriction, we can avoid heart failure exacerbations. -Consider retrial of ACE/ARB if pressure allows at follow-up in 3 months. -Check BMET in 2 weeks with lasix/jardiance initiation.   Atrial fibrillation in setting of COVID-19, in NSR today -no recurrence of a-fib on Zio patch 03/2021 -Continue Eliquis for stroke prevention.    PVCs -largely monomorphic.  The PVCs appear to be originating from the apical inferoseptum per EP -treated with amiodarone  and mexiletine; f/u with EP  Coronary artery disease status post CABG, stable - no angina -CABG 2010: left internal mammary artery to left anterior descending, sequential saphenous vein graft to ramus intermedius and obtuse marginal 2, saphenous vein graft to posterior descending -No angina.  Continue statin and beta-blocker therapy.  No aspirin given anticoagulation.   Hypertension, well controlled -Continue medications as above. -Goal BP is <130/80.  Recommend DASH diet (high in vegetables, fruits, low-fat dairy products, whole grains, poultry, fish, and nuts and low in sweets, sugar-sweetened beverages, and red meats), salt restriction and increase physical activity.  Refer to dietitian.   Hyperlipidemia -LDL 38 in December 2022.  Continue statin.   Disposition - Follow-up in 3 months with Dr. Stanford Breed.  Met with Westley Hummer, our social worker, today.    ADDENDA: reviewed lab results 06/02/2021 from his nephrologist.  Total cholesterol 127, triglycerides 82, HDL 49, LDL 62.  K 4.4.   Medication Adjustments/Labs and Tests Ordered: Current medicines are reviewed at length with the patient today.  Concerns regarding medicines are outlined above.  Orders Placed This Encounter  Procedures   Basic metabolic panel   Ambulatory referral to Nutrition and Diabetic Education   EKG 12-Lead   Meds ordered this encounter  Medications   empagliflozin (JARDIANCE) 10 MG TABS tablet    Sig: Take 1 tablet (10 mg total) by mouth daily before breakfast.    Dispense:  90 tablet    Refill:  1   furosemide (LASIX) 20 MG tablet    Sig: Take 1 tablet (20 mg total) by mouth as needed.    Dispense:  30 tablet    Refill:  2    Patient Instructions  Medication Instructions:  Start Jardiance 10 mg  ( Take 1 Tablet Daily). Start Lasix 20 mg ( Take 1 Tablet For (3) Three Days. Then Take 1 Tablet Daily As Needed For Swelling ). *If you need a refill on your cardiac medications before your next  appointment, please call your pharmacy*   Lab Work: BMET : To Be Done In 2 Weeks. If you have labs (blood work) drawn today and your tests are completely normal, you will receive your results only by: [place] (if you have MyChart) OR A paper copy in the mail If you have any lab test that is abnormal or we need to change your treatment, we will call you to review the results.   Testing/Procedures: No Testing    Follow-Up: At [facility], you and your health needs are our priority.  As part of our continuing mission to provide you with exceptional heart care, we have created designated Provider Care Teams.  These Care Teams include your primary Cardiologist (physician) and Advanced Practice Providers (APPs -  Physician Assistants and Nurse Practitioners) who all work together to provide you with the care you need, when you need it.  We recommend signing up for the patient portal called "MyChart".  Sign up information is provided on this After Visit Summary.  MyChart is used to connect with patients for Virtual Visits (Telemedicine).  Patients are able to view lab/test results, encounter notes, upcoming appointments, etc.  Non-urgent messages can be sent to your provider as well.   To learn more about what you can do with MyChart, go to [email or web address].    Your next appointment:   3 month(s)  The format for your next appointment:   In Person  Provider:   Kirk Ruths, MD    Other Instructions Recommended to wear compression Stockings Daily.  Signed, Warren Lacy, PA-C  05/23/2021 10:42 AM    [place]

## 2021-05-23 ENCOUNTER — Other Ambulatory Visit: Payer: Self-pay

## 2021-05-23 ENCOUNTER — Ambulatory Visit: Payer: Medicare Other | Admitting: Physician Assistant

## 2021-05-23 ENCOUNTER — Encounter: Payer: Self-pay | Admitting: Physician Assistant

## 2021-05-23 VITALS — BP 130/72 | HR 58 | Ht 62.0 in | Wt 134.8 lb

## 2021-05-23 DIAGNOSIS — I493 Ventricular premature depolarization: Secondary | ICD-10-CM

## 2021-05-23 DIAGNOSIS — I2510 Atherosclerotic heart disease of native coronary artery without angina pectoris: Secondary | ICD-10-CM | POA: Diagnosis not present

## 2021-05-23 DIAGNOSIS — E7800 Pure hypercholesterolemia, unspecified: Secondary | ICD-10-CM | POA: Diagnosis not present

## 2021-05-23 DIAGNOSIS — I480 Paroxysmal atrial fibrillation: Secondary | ICD-10-CM

## 2021-05-23 DIAGNOSIS — I5020 Unspecified systolic (congestive) heart failure: Secondary | ICD-10-CM | POA: Diagnosis not present

## 2021-05-23 MED ORDER — FUROSEMIDE 20 MG PO TABS: 20.0000 mg | ORAL_TABLET | ORAL | 2 refills | Status: DC | PRN

## 2021-05-23 MED ORDER — EMPAGLIFLOZIN 10 MG PO TABS: 10.0000 mg | ORAL_TABLET | Freq: Every day | ORAL | 1 refills | Status: DC

## 2021-05-23 NOTE — Patient Instructions (Signed)
Medication Instructions:  Start Jardiance 10 mg  ( Take 1 Tablet Daily). Start Lasix 20 mg ( Take 1 Tablet For (3) Three Days. Then Take 1 Tablet Daily As Needed For Swelling ). *If you need a refill on your cardiac medications before your next appointment, please call your pharmacy*   Lab Work: BMET : To Be Done In 2 Weeks. If you have labs (blood work) drawn today and your tests are completely normal, you will receive your results only by: [place] [facility] (if you have MyChart) OR A paper copy in the mail If you have any lab test that is abnormal or we need to change your treatment, we will call you to review the results.   Testing/Procedures: No Testing    Follow-Up: At [facility], you and your health needs are our priority.  As part of our continuing mission to provide you with exceptional heart care, we have created designated Provider Care Teams.  These Care Teams include your primary Cardiologist (physician) and Advanced Practice Providers (APPs -  Physician Assistants and Nurse Practitioners) who all work together to provide you with the care you need, when you need it.  We recommend signing up for the patient portal called "MyChart".  Sign up information is provided on this After Visit Summary.  MyChart is used to connect with patients for Virtual Visits (Telemedicine).  Patients are able to view lab/test results, encounter notes, upcoming appointments, etc.  Non-urgent messages can be sent to your provider as well.   To learn more about what you can do with MyChart, go to [email or web address].    Your next appointment:   3 month(s)  The format for your next appointment:   In Person  Provider:   Kirk Ruths, MD    Other Instructions Recommended to wear compression Stockings Daily.

## 2021-05-23 NOTE — Progress Notes (Signed)
Met briefly with pt and his wife Brent Griffith (who I am familiar with and who is also a patient with our clinic). Pt and pt wife grateful for their holiday presents through clinic, they share that they are on a better track now that they have been able to file for bankruptcy and can keep their house and car at this time. They have been able to get to appointments and fill their medications. Both continue to be insured. They have my contact information for any other questions/concerns.   Brent Griffith, MSW, [place]  [phone]- work cell phone (preferred) [phone]- desk phone

## 2021-06-01 DIAGNOSIS — D508 Other iron deficiency anemias: Secondary | ICD-10-CM | POA: Diagnosis not present

## 2021-06-02 ENCOUNTER — Telehealth: Payer: Self-pay

## 2021-06-02 ENCOUNTER — Other Ambulatory Visit: Payer: Self-pay

## 2021-06-02 DIAGNOSIS — I2510 Atherosclerotic heart disease of native coronary artery without angina pectoris: Secondary | ICD-10-CM

## 2021-06-02 DIAGNOSIS — I493 Ventricular premature depolarization: Secondary | ICD-10-CM

## 2021-06-02 DIAGNOSIS — I5020 Unspecified systolic (congestive) heart failure: Secondary | ICD-10-CM | POA: Diagnosis not present

## 2021-06-02 DIAGNOSIS — E7800 Pure hypercholesterolemia, unspecified: Secondary | ICD-10-CM | POA: Diagnosis not present

## 2021-06-02 DIAGNOSIS — I480 Paroxysmal atrial fibrillation: Secondary | ICD-10-CM

## 2021-06-02 NOTE — Telephone Encounter (Signed)
I called patient to inform him that the ifob specimen that he dropped off only had the paperwork and no specimen to send back. Patient did not answer the phone so a detailed message with the above information was left on voicemail.

## 2021-06-03 LAB — BASIC METABOLIC PANEL WITH GFR
BUN/Creatinine Ratio: 19 (calc) (ref 6–22)
BUN: 24 mg/dL (ref 7–25)
CO2: 26 mmol/L (ref 20–32)
Calcium: 8.8 mg/dL (ref 8.6–10.3)
Chloride: 105 mmol/L (ref 98–110)
Creat: 1.28 mg/dL — ABNORMAL HIGH (ref 0.70–1.22)
Glucose, Bld: 77 mg/dL (ref 65–99)
Potassium: 4.1 mmol/L (ref 3.5–5.3)
Sodium: 138 mmol/L (ref 135–146)
eGFR: 56 mL/min/{1.73_m2} — ABNORMAL LOW (ref >=60)

## 2021-06-05 ENCOUNTER — Telehealth: Payer: Self-pay | Admitting: Cardiology

## 2021-06-05 NOTE — Telephone Encounter (Signed)
Pt c/o medication issue:  1. Name of Medication:  empagliflozin (JARDIANCE) 10 MG TABS tablet furosemide (LASIX) 20 MG tablet  2. How are you currently taking this medication (dosage and times per day)?   3. Are you having a reaction (difficulty breathing--STAT)?   4. What is your medication issue?   Patient states [facility] is requesting a PA explaining why the patient has been prescribed these medications prior to distributing to the patient. Patient provided order # if needed, [id number].

## 2021-06-05 NOTE — Telephone Encounter (Signed)
Patient returned call and states he will bring specimen to office tomorrow, 06/06/21.

## 2021-06-06 ENCOUNTER — Other Ambulatory Visit: Payer: Self-pay

## 2021-06-06 DIAGNOSIS — D508 Other iron deficiency anemias: Secondary | ICD-10-CM

## 2021-06-07 ENCOUNTER — Telehealth: Payer: Self-pay

## 2021-06-07 LAB — FECAL GLOBIN BY IMMUNOCHEMISTRY
FECAL GLOBIN RESULT:: NOT DETECTED
MICRO NUMBER:: 12944098
SPECIMEN QUALITY:: ADEQUATE

## 2021-06-07 NOTE — Telephone Encounter (Signed)
Called patient regarding prescriptions. Left message for patient to call office.

## 2021-06-08 ENCOUNTER — Telehealth: Payer: Self-pay

## 2021-06-08 NOTE — Telephone Encounter (Signed)
Called patient regarding Prior Authorization for medication.

## 2021-06-08 NOTE — Telephone Encounter (Signed)
Patient returned call regarding Prescription. Advised patient Prior Authorization was not needed. Clarification on medication instruction. Also advised patient will need to call [facility] to add payment method to account.

## 2021-06-14 MED ORDER — CARVEDILOL 6.25 MG PO TABS: 6.2500 mg | ORAL_TABLET | Freq: Two times a day (BID) | ORAL | 3 refills | Status: DC

## 2021-06-14 NOTE — Telephone Encounter (Signed)
Left detailed message, we have not sent a new rx to pharmacy since 03-2021. Last rx carvedilol (COREG) 6.25 MG tablet Dispense Quantity: 180 tablet Refills: 3 Sig: Take 1 tablet (6.25 mg total) by mouth 2 (two) times daily. New rx sent to [facility].

## 2021-06-14 NOTE — Telephone Encounter (Signed)
Pt c/o medication issue:   1. Name of Medication:  carvedilol (COREG) 6.25 MG tablet   2. How are you currently taking this medication (dosage and times per day)? Take 1 tablet (6.25 mg total) by mouth 2 (two) times daily.   3. Are you having a reaction (difficulty breathing--STAT)? No    4. What is your medication issue?  Patient called and said that pharmacy gave him the wrong dosage. Instead of 6.25 mg they gave him 3.25 mg. Please resend prescription with proper dosage

## 2021-06-18 ENCOUNTER — Other Ambulatory Visit (HOSPITAL_BASED_OUTPATIENT_CLINIC_OR_DEPARTMENT_OTHER): Payer: Self-pay | Admitting: General Practice

## 2021-06-25 NOTE — Progress Notes (Signed)
Cardiology Office Note Date:  06/28/2021  Patient ID:  Brent Griffith, Brent Griffith Feb 13, 1936, MRN [id number] PCP:  [name], NP  Cardiologist:  Dr. Stanford Breed Electrophysiologist: Dr. Quentin Ore    Chief Complaint:  planned f/u  History of Present Illness: Brent Griffith is a 86 y.o. male with history of CAD (CABG 2010), HLD, HTN, OSA, Afib, PVCs, ICM  He comes in today to be seen for Dr. Quentin Ore, last seen by him Dec 2022, referred for evaluation, management of PVCs. Pt timed onset of his palpitations post COVID infection, his coreg had been increased and subsequent monitoring noted high burden, mostly monomorphic. Suspect PVCs not primary driver of his LV dysfunction with WMA on his echo and CAD. Amiodarone increased to 400mg  QD for 2 weeks, and added mexiletine. Not an ablation candidate 2/2 advanced age and not ca clear relationship of his PVCs and CM. BP limits GDMT  More recently saw Melvenia Needles, PA-C, 05/23/21, noted improved/reduced ectopy burden by his observation on his pulse ox. Reported LE swelling, his wife suspected 2/2 canned foods/sodium. Counseled on diet and increase activity.   TODAY He is doing well. He has never felt his PVCs but "sees" them by skipped beats when checking his pulse Ox or on his BP cuff. He thinks the addition of the mexiletine really helped, notes them infrequently now. He denies any kind of exertional intolerances. He walks his dig a couple times a day, does all the house work, laundry, cleaning, and so on with good stamina No CP, palpitations or SOB No dizzy spells, no near syncope or syncope No bleeding or signs of bleeding   AFib Hx Diagnosed Sept 2022 AAD hx Amiodarone started Sept 2022 for AFib  Past Medical History:  Diagnosis Date   Acute gastric ulcer without mention of hemorrhage, perforation, or obstruction    Allergic rhinitis, cause unspecified    Anemia, unspecified    Arthritis    Asthma    Blood transfusion without reported  diagnosis    CAD (coronary artery disease)    Diaphragmatic hernia without mention of obstruction or gangrene    Elevated prostate specific antigen (PSA)    Enlarged prostate    Esophageal reflux    Extrinsic asthma, unspecified    Herpes zoster without mention of complication    Hyperlipidemia    Hypersomnia with sleep apnea, unspecified    Impotence of organic origin    Kyphosis    Lumbago    Obstructive sleep apnea (adult) (pediatric)    Pneumonia    hx of years ago    Senile osteoporosis    Trigger finger (acquired)    Tubular adenoma of colon 05/2013   Unspecified essential hypertension    Unspecified sleep apnea    CPAP- settings 4-12    Unspecified vitamin D deficiency     Past Surgical History:  Procedure Laterality Date   CARDIAC CATHETERIZATION  03/04/2009   Dr [name]   COLONOSCOPY     CORONARY ARTERY BYPASS GRAFT  2010   CYSTOSCOPY WITH RETROGRADE PYELOGRAM, URETEROSCOPY AND STENT PLACEMENT Right 10/04/2013   Procedure: CYSTOSCOPY WITH RETROGRADE PYELOGRAM,  AND STENT PLACEMENT;  Surgeon: Festus Aloe, MD;  Location: [facility];  Service: Urology;  Laterality: Right;   DENTAL SURGERY N/A    MOLE REMOVAL  1958   chin   ROTATOR CUFF REPAIR Left 04/1998   with bone spur removed, Dr French Ana   TONSILLECTOMY  1945   TRANSURETHRAL RESECTION OF PROSTATE N/A 09/24/2013  Procedure: TRANSURETHRAL RESECTION OF THE PROSTATE (TURP) WITH GYRUS (STAGED RIGHT LATERAL AND MEDIAN LOBE);  Surgeon: Ailene Rud, MD;  Location: [facility];  Service: Urology;  Laterality: N/A;   UPPER GI ENDOSCOPY      Current Outpatient Medications  Medication Sig Dispense Refill   acetaminophen (TYLENOL) 500 MG tablet Take 1,000 mg by mouth every 6 (six) hours as needed for mild pain or moderate pain.     albuterol (VENTOLIN HFA) 108 (90 Base) MCG/ACT inhaler USE 1 TO 2 INHALATIONS BY  MOUTH EVERY 6 HOURS AS  NEEDED FOR WHEEZING OR  SHORTNESS OF BREATH 34 g 3   amiodarone (PACERONE)  200 MG tablet Take 200 mg by mouth daily.     apixaban (ELIQUIS) 2.5 MG TABS tablet Take 1 tablet (2.5 mg total) by mouth 2 (two) times daily. 56 tablet 0   b complex vitamins tablet Take 1 tablet by mouth daily.     benzonatate (TESSALON) 100 MG capsule Take 1 capsule (100 mg total) by mouth 3 (three) times daily as needed for cough. 30 capsule 0   Calcium Citrate-Vitamin D (EQ CALCIUM CITRATE+D3 PO) Take 1,200 mg by mouth daily.     carvedilol (COREG) 6.25 MG tablet Take 1 tablet (6.25 mg total) by mouth 2 (two) times daily. 180 tablet 3   Cholecalciferol (VITAMIN D-3) 5000 units TABS Take 10,000 Units by mouth daily.     Coenzyme Q10 (COQ10) 200 MG CAPS Take 200 mg by mouth daily.     doxazosin (CARDURA) 2 MG tablet TAKE 1 TABLET BY MOUTH  DAILY 90 tablet 3   empagliflozin (JARDIANCE) 10 MG TABS tablet Take 1 tablet (10 mg total) by mouth daily before breakfast. 90 tablet 1   fexofenadine (ALLEGRA) 180 MG tablet Take 180 mg by mouth daily as needed for allergies.     finasteride (PROSCAR) 5 MG tablet TAKE 1 TABLET BY MOUTH  EVERY MORNING 90 tablet 3   folic acid (FOLVITE) 1 MG tablet TAKE 1 TABLET BY MOUTH  DAILY 90 tablet 3   furosemide (LASIX) 20 MG tablet Take 1 tablet (20 mg total) by mouth as needed. 30 tablet 2   mexiletine (MEXITIL) 150 MG capsule Take 1 capsule (150 mg total) by mouth 2 (two) times daily. 180 capsule 0   Multiple Vitamin (MULTIVITAMIN) tablet Take 1 tablet by mouth daily.     ondansetron (ZOFRAN) 4 MG tablet Take 1 tablet (4 mg total) by mouth every 6 (six) hours. 12 tablet 0   OVER THE COUNTER MEDICATION Take 1-2 tablets by mouth at bedtime as needed (pain). Pergosic     pantoprazole (PROTONIX) 40 MG tablet Take 1 tablet (40 mg total) by mouth daily. 90 tablet 3   rosuvastatin (CRESTOR) 20 MG tablet TAKE 1 TABLET BY MOUTH ONCE DAILY FOR CHOLESTEROL 90 tablet 3   No current facility-administered medications for this visit.    Allergies:   Compazine  [prochlorperazine edisylate]   Social History:  The patient  reports that he has never smoked. He has never been exposed to tobacco smoke. He has never used smokeless tobacco. He reports that he does not drink alcohol and does not use drugs.   Family History:  The patient's family history includes Breast cancer in his mother; Cirrhosis in his mother; Heart disease in his father and paternal grandfather; Hypertension in his father; Kyphosis in his mother; Rheumatic fever in his mother; Stroke in his father and mother.  ROS:  Please see the  history of present illness.    All other systems are reviewed and otherwise negative.   PHYSICAL EXAM:  VS:  BP (!) 152/84    Pulse (!) 58    Ht 5\' 2"  (1.575 m)    Wt 127 lb 3.2 oz (57.7 kg)    SpO2 96%    BMI 23.27 kg/m  BMI: Body mass index is 23.27 kg/m. Well nourished, well developed, in no acute distress HEENT: normocephalic, atraumatic Neck: no JVD, carotid bruits or masses Cardiac:  RRR; no significant murmurs, no rubs, or gallops Lungs:  CTA b/l, no wheezing, rhonchi or rales Abd: soft, nontender MS: significany kyphosis, age appropriate atrophy Ext: trace  edema Skin: warm and dry, no rash Neuro:  No gross deficits appreciated Psych: euthymic mood, full affect   EKG:  Done today and reviewed by myself shows  SB 58bpm, inf/lat T inv, no ST changes   01/19/21: TTE IMPRESSIONS   1. Compared to echo from 2015, LVEF is depressed with wall motion  abnormalities noted.   2. LVEF is depressed with hypokinesis inf the inferior and distal lateral  and apical walls . Left ventricular ejection fraction, by estimation, is  40 to 45%. The left ventricle has mildly decreased function.   3. Right ventricular systolic function is normal. The right ventricular  size is normal. There is mildly elevated pulmonary artery systolic  pressure.   4. Mild mitral valve regurgitation.   5. Aortic valve regurgitation is mild. Mild aortic valve sclerosis is   present, with no evidence of aortic valve stenosis.    08/28/2016:stress myoview The left ventricular ejection fraction is mildly decreased (45-54%). Nuclear stress EF: 48%. Defect 1: There is a medium defect of severe severity present in the basal inferolateral and mid inferolateral location. Defect 2: There is a small defect of severe severity present in the apical lateral location. This is a low risk study.   Low risk stress nuclear study with prior inferolateral infarct and mild apical ischemia; EF 48 with apical hypokinesis and mild LVE.  03/20/2014: LVEF 50-55%, no WMA   Recent Labs: 01/19/2021: Magnesium 1.6; TSH 1.253 05/05/2021: ALT 23 05/17/2021: Hemoglobin 10.0; Platelets 211 06/02/2021: BUN 24; Creat 1.28; Potassium 4.1; Sodium 138  05/05/2021: Cholesterol 107; HDL 57; LDL Cholesterol (Calc) 38; Total CHOL/HDL Ratio 1.9; Triglycerides 42   CrCl cannot be calculated (Patient's most recent lab result is older than the maximum 21 days allowed.).   Wt Readings from Last 3 Encounters:  06/28/21 127 lb 3.2 oz (57.7 kg)  05/23/21 134 lb 12.8 oz (61.1 kg)  05/05/21 138 lb (62.6 kg)     Other studies reviewed: Additional studies/records reviewed today include: summarized above  ASSESSMENT AND PLAN:  PVCs None on today's EKG, much improved by his observation as well  amio labs today  Paroxysmal Afib CHA2DS2Vasc is 5, on Eliquis, appropriately dosed for age/weight Minimal if any burden by symptoms Amiodarone  CAD T changes on today's EKG, perhaps some suggestion of this on his EKG in Dec, though not as appreciated onhis last EKG D/w DOD, given no symptoms,no w/u recommended Will check labs  ICM Chronic CHF (systolic) Appears euvolemic today C/w Dr. Stanford Breed and team  Disposition: F/u with EP in 51mo, sooner if needed  Current medicines are reviewed at length with the patient today.  The patient did not have any concerns regarding medicines.  Venetia Night, PA-C 06/28/2021 2:55 PM     [place]  [address] [address] [phone] (office)  [phone] (fax)

## 2021-06-28 ENCOUNTER — Encounter: Payer: Self-pay | Admitting: Physician Assistant

## 2021-06-28 ENCOUNTER — Other Ambulatory Visit: Payer: Self-pay

## 2021-06-28 ENCOUNTER — Ambulatory Visit (INDEPENDENT_AMBULATORY_CARE_PROVIDER_SITE_OTHER): Payer: Medicare Other | Admitting: Physician Assistant

## 2021-06-28 VITALS — BP 152/84 | HR 58 | Ht 62.0 in | Wt 127.2 lb

## 2021-06-28 DIAGNOSIS — I255 Ischemic cardiomyopathy: Secondary | ICD-10-CM

## 2021-06-28 DIAGNOSIS — Z79899 Other long term (current) drug therapy: Secondary | ICD-10-CM

## 2021-06-28 DIAGNOSIS — I493 Ventricular premature depolarization: Secondary | ICD-10-CM

## 2021-06-28 DIAGNOSIS — I480 Paroxysmal atrial fibrillation: Secondary | ICD-10-CM | POA: Diagnosis not present

## 2021-06-28 DIAGNOSIS — I5022 Chronic systolic (congestive) heart failure: Secondary | ICD-10-CM | POA: Diagnosis not present

## 2021-06-28 DIAGNOSIS — I2510 Atherosclerotic heart disease of native coronary artery without angina pectoris: Secondary | ICD-10-CM | POA: Diagnosis not present

## 2021-06-28 NOTE — Patient Instructions (Signed)
Medication Instructions:   Your physician recommends that you continue on your current medications as directed. Please refer to the Current Medication list given to you today.   *If you need a refill on your cardiac medications before your next appointment, please call your pharmacy*   Lab Work:  CMET MAG TSH TODAY    If you have labs (blood work) drawn today and your tests are completely normal, you will receive your results only by: [place] (if you have MyChart) OR A paper copy in the mail If you have any lab test that is abnormal or we need to change your treatment, we will call you to review the results.   Testing/Procedures: NONE ORDERED  TODAY    Follow-Up: At [facility], you and your health needs are our priority.  As part of our continuing mission to provide you with exceptional heart care, we have created designated Provider Care Teams.  These Care Teams include your primary Cardiologist (physician) and Advanced Practice Providers (APPs -  Physician Assistants and Nurse Practitioners) who all work together to provide you with the care you need, when you need it.  We recommend signing up for the patient portal called "MyChart".  Sign up information is provided on this After Visit Summary.  MyChart is used to connect with patients for Virtual Visits (Telemedicine).  Patients are able to view lab/test results, encounter notes, upcoming appointments, etc.  Non-urgent messages can be sent to your provider as well.   To learn more about what you can do with MyChart, go to [email or web address].    Your next appointment:   6 month(s)  The format for your next appointment:   In Person  Provider:   You may see Dr. Quentin Ore  or one of the following Advanced Practice Providers on your designated Care Team:   Tommye Standard, [place] Legrand Como "Jonni Sanger" Chalmers Cater, [place]     Other Instructions

## 2021-06-29 LAB — COMPREHENSIVE METABOLIC PANEL
ALT: 41 IU/L (ref 0–44)
AST: 45 IU/L — ABNORMAL HIGH (ref 0–40)
Albumin/Globulin Ratio: 2 (ref 1.2–2.2)
Albumin: 4.4 g/dL (ref 3.6–4.6)
Alkaline Phosphatase: 69 IU/L (ref 44–121)
BUN/Creatinine Ratio: 16 (ref 10–24)
BUN: 22 mg/dL (ref 8–27)
Bilirubin Total: 0.7 mg/dL (ref 0.0–1.2)
CO2: 23 mmol/L (ref 20–29)
Calcium: 9.4 mg/dL (ref 8.6–10.2)
Chloride: 102 mmol/L (ref 96–106)
Creatinine, Ser: 1.38 mg/dL — ABNORMAL HIGH (ref 0.76–1.27)
Globulin, Total: 2.2 g/dL (ref 1.5–4.5)
Glucose: 97 mg/dL (ref 70–99)
Potassium: 4.7 mmol/L (ref 3.5–5.2)
Sodium: 141 mmol/L (ref 134–144)
Total Protein: 6.6 g/dL (ref 6.0–8.5)
eGFR: 51 mL/min/{1.73_m2} — ABNORMAL LOW (ref >59)

## 2021-06-29 LAB — MAGNESIUM: Magnesium: 2.2 mg/dL (ref 1.6–2.3)

## 2021-06-29 LAB — TSH: TSH: 1.33 u[IU]/mL (ref 0.450–4.500)

## 2021-06-30 ENCOUNTER — Other Ambulatory Visit: Payer: Self-pay | Admitting: *Deleted

## 2021-06-30 DIAGNOSIS — Z79899 Other long term (current) drug therapy: Secondary | ICD-10-CM

## 2021-07-28 MED ORDER — MEXILETINE HCL 150 MG PO CAPS: 150.0000 mg | ORAL_CAPSULE | Freq: Two times a day (BID) | ORAL | 1 refills | Status: DC

## 2021-08-01 NOTE — Progress Notes (Signed)
? ? ? ? ?HPI:FU coronary artery disease and atrial fibrillation. Cardiac catheterization on March 04, 2009 revealed severe three-vessel coronary disease as well as an 80% left main. His ejection fraction was 50%. He ultimately underwent coronary artery bypass and graft on November 1 (left internal mammary artery to left anterior descending, sequential saphenous vein graft to ramus intermedius and obtuse marginal 2, saphenous vein graft to posterior descending. Nuclear study April 2018 showed ejection fraction 48%. There was prior inferolateral infarct and mild apical ischemia.  Echocardiogram repeated September 2022 and showed ejection fraction 40 to 45%, mild mitral regurgitation, mild aortic insufficiency.  Monitor November 2022 showed sinus rhythm with occasional PAC, 5 beats PAT and occasional PVC.  Diagnosed with atrial fibrillation September 2022.  Started on amiodarone.  Also noted to have PVCs and mexiletine was added.  Since he was last seen, he denies dyspnea, chest pain, palpitations, syncope or bleeding. ? ?Current Outpatient Medications  ?Medication Sig Dispense Refill  ? acetaminophen (TYLENOL) 500 MG tablet Take 1,000 mg by mouth every 6 (six) hours as needed for mild pain or moderate pain.    ? albuterol (VENTOLIN HFA) 108 (90 Base) MCG/ACT inhaler USE 1 TO 2 INHALATIONS BY  MOUTH EVERY 6 HOURS AS  NEEDED FOR WHEEZING OR  SHORTNESS OF BREATH 34 g 3  ? amiodarone (PACERONE) 200 MG tablet Take 200 mg by mouth daily.    ? apixaban (ELIQUIS) 2.5 MG TABS tablet Take 1 tablet (2.5 mg total) by mouth 2 (two) times daily. 56 tablet 0  ? b complex vitamins tablet Take 1 tablet by mouth daily.    ? benzonatate (TESSALON) 100 MG capsule Take 1 capsule (100 mg total) by mouth 3 (three) times daily as needed for cough. 30 capsule 0  ? Calcium Citrate-Vitamin D (EQ CALCIUM CITRATE+D3 PO) Take 1,200 mg by mouth daily.    ? carvedilol (COREG) 6.25 MG tablet Take 1 tablet (6.25 mg total) by mouth 2 (two) times  daily. 180 tablet 3  ? Cholecalciferol (VITAMIN D-3) 5000 units TABS Take 10,000 Units by mouth daily.    ? Coenzyme Q10 (COQ10) 200 MG CAPS Take 200 mg by mouth daily.    ? doxazosin (CARDURA) 2 MG tablet TAKE 1 TABLET BY MOUTH  DAILY 90 tablet 3  ? empagliflozin (JARDIANCE) 10 MG TABS tablet Take 1 tablet (10 mg total) by mouth daily before breakfast. 90 tablet 1  ? fexofenadine (ALLEGRA) 180 MG tablet Take 180 mg by mouth daily as needed for allergies.    ? finasteride (PROSCAR) 5 MG tablet TAKE 1 TABLET BY MOUTH  EVERY MORNING 90 tablet 3  ? folic acid (FOLVITE) 1 MG tablet TAKE 1 TABLET BY MOUTH  DAILY 90 tablet 3  ? furosemide (LASIX) 20 MG tablet Take 1 tablet (20 mg total) by mouth as needed. 30 tablet 2  ? mexiletine (MEXITIL) 150 MG capsule Take 1 capsule (150 mg total) by mouth 2 (two) times daily. 180 capsule 1  ? Multiple Vitamin (MULTIVITAMIN) tablet Take 1 tablet by mouth daily.    ? ondansetron (ZOFRAN) 4 MG tablet Take 1 tablet (4 mg total) by mouth every 6 (six) hours. 12 tablet 0  ? OVER THE COUNTER MEDICATION Take 1-2 tablets by mouth at bedtime as needed (pain). Pergosic    ? pantoprazole (PROTONIX) 40 MG tablet TAKE 1 TABLET BY MOUTH  DAILY 90 tablet 3  ? rosuvastatin (CRESTOR) 20 MG tablet TAKE 1 TABLET BY MOUTH ONCE DAILY FOR CHOLESTEROL  90 tablet 3  ? ?No current facility-administered medications for this visit.  ? ? ? ?Past Medical History:  ?Diagnosis Date  ? Acute gastric ulcer without mention of hemorrhage, perforation, or obstruction   ? Allergic rhinitis, cause unspecified   ? Anemia, unspecified   ? Arthritis   ? Asthma   ? Blood transfusion without reported diagnosis   ? CAD (coronary artery disease)   ? Diaphragmatic hernia without mention of obstruction or gangrene   ? Elevated prostate specific antigen (PSA)   ? Enlarged prostate   ? Esophageal reflux   ? Extrinsic asthma, unspecified   ? Herpes zoster without mention of complication   ? Hyperlipidemia   ? Hypersomnia with sleep  apnea, unspecified   ? Impotence of organic origin   ? Kyphosis   ? Lumbago   ? Obstructive sleep apnea (adult) (pediatric)   ? Pneumonia   ? hx of years ago   ? Senile osteoporosis   ? Trigger finger (acquired)   ? Tubular adenoma of colon 05/2013  ? Unspecified essential hypertension   ? Unspecified sleep apnea   ? CPAP- settings 4-12   ? Unspecified vitamin D deficiency   ? ? ?Past Surgical History:  ?Procedure Laterality Date  ? CARDIAC CATHETERIZATION  03/04/2009  ? Dr [name]  ? COLONOSCOPY    ? CORONARY ARTERY BYPASS GRAFT  2010  ? CYSTOSCOPY WITH RETROGRADE PYELOGRAM, URETEROSCOPY AND STENT PLACEMENT Right 10/04/2013  ? Procedure: CYSTOSCOPY WITH RETROGRADE PYELOGRAM,  AND STENT PLACEMENT;  Surgeon: Festus Aloe, MD;  Location: [facility];  Service: Urology;  Laterality: Right;  ? DENTAL SURGERY N/A   ? [place]  ? chin  ? ROTATOR CUFF REPAIR Left 04/1998  ? with bone spur removed, Dr French Ana  ? TONSILLECTOMY  1945  ? TRANSURETHRAL RESECTION OF PROSTATE N/A 09/24/2013  ? Procedure: TRANSURETHRAL RESECTION OF THE PROSTATE (TURP) WITH GYRUS (STAGED RIGHT LATERAL AND MEDIAN LOBE);  Surgeon: Ailene Rud, MD;  Location: [facility];  Service: Urology;  Laterality: N/A;  ? UPPER GI ENDOSCOPY    ? ? ?Social History  ? ?Socioeconomic History  ? Marital status: Married  ?  Spouse name: Not on file  ? Number of children: 0  ? Years of education: Not on file  ? Highest education level: Not on file  ?Occupational History  ? Occupation: [employer]  ?Tobacco Use  ? Smoking status: Never  ?  Passive exposure: Never  ? Smokeless tobacco: Never  ?Vaping Use  ? Vaping Use: Never used  ?Substance and Sexual Activity  ? Alcohol use: No  ? Drug use: No  ? Sexual activity: Not Currently  ?Other Topics Concern  ? Not on file  ?Social History Narrative  ? Not on file  ? ?Social Determinants of Health  ? ?Financial Resource Strain: Not on file  ?Food Insecurity: Not on file  ?Transportation Needs:  Not on file  ?Physical Activity: Not on file  ?Stress: Not on file  ?Social Connections: Not on file  ?Intimate Partner Violence: Not on file  ? ? ?Family History  ?Problem Relation Age of Onset  ? Stroke Mother   ? Breast cancer Mother   ? Cirrhosis Mother   ?     wine  ? Rheumatic fever Mother   ? Kyphosis Mother   ? Hypertension Father   ? Stroke Father   ? Heart disease Father   ? Heart disease Paternal Grandfather   ? Colon  cancer Neg Hx   ? ? ?ROS: no fevers or chills, productive cough, hemoptysis, dysphasia, odynophagia, melena, hematochezia, dysuria, hematuria, rash, seizure activity, orthopnea, PND, pedal edema, claudication. Remaining systems are negative. ? ?Physical Exam: ?Well-developed well-nourished in no acute distress.  ?Skin is warm and dry.  ?HEENT is normal.  ?Neck is supple.  ?Chest is clear to auscultation with normal expansion.  ?Cardiovascular exam is regular rate and rhythm.  ?Abdominal exam nontender or distended. No masses palpated. ?Extremities show no edema. ?neuro grossly intact ? ?A/P ? ?1 paroxysmal atrial fibrillation-patient remains in sinus rhythm on exam.  We will continue amiodarone and apixaban.  Check chest x-ray.  Recent liver functions and TSH normal. ? ?2 chronic combined systolic/diastolic congestive heart failure-continue present dose of Lasix, beta-blocker and Jardiance.  Add losartan 25 mg daily.  Check potassium and renal function in 1 week. ? ?3 coronary artery disease-status post coronary artery bypass graft.  Patient denies chest pain.  Continue statin.  He is not on aspirin given need for anticoagulation. ? ?4 hypertension-patient's blood pressure is controlled.  Continue present medications. ? ?5 hyperlipidemia-continue statin. ? ?6 history of monomorphic PVCs-continue amiodarone and mexiletine. ? ?Kirk Ruths, MD ? ? ? ?

## 2021-08-06 ENCOUNTER — Encounter: Payer: Self-pay | Admitting: Internal Medicine

## 2021-08-08 ENCOUNTER — Other Ambulatory Visit: Payer: Self-pay | Admitting: Nurse Practitioner

## 2021-08-08 DIAGNOSIS — K219 Gastro-esophageal reflux disease without esophagitis: Secondary | ICD-10-CM

## 2021-08-09 ENCOUNTER — Encounter: Payer: Self-pay | Admitting: Cardiology

## 2021-08-09 ENCOUNTER — Ambulatory Visit: Payer: Medicare Other | Admitting: Cardiology

## 2021-08-09 ENCOUNTER — Ambulatory Visit (HOSPITAL_BASED_OUTPATIENT_CLINIC_OR_DEPARTMENT_OTHER)
Admission: RE | Admit: 2021-08-09 | Discharge: 2021-08-09 | Disposition: A | Discharge status: Home / Self Care | Payer: Medicare Other | Source: Ambulatory Visit | Attending: Cardiology | Admitting: Cardiology

## 2021-08-09 VITALS — BP 122/64 | HR 52 | Ht 62.0 in | Wt 127.0 lb

## 2021-08-09 DIAGNOSIS — I255 Ischemic cardiomyopathy: Secondary | ICD-10-CM

## 2021-08-09 DIAGNOSIS — I480 Paroxysmal atrial fibrillation: Secondary | ICD-10-CM | POA: Insufficient documentation

## 2021-08-09 DIAGNOSIS — I2510 Atherosclerotic heart disease of native coronary artery without angina pectoris: Secondary | ICD-10-CM | POA: Diagnosis not present

## 2021-08-09 DIAGNOSIS — I10 Essential (primary) hypertension: Secondary | ICD-10-CM

## 2021-08-09 DIAGNOSIS — E7800 Pure hypercholesterolemia, unspecified: Secondary | ICD-10-CM | POA: Diagnosis not present

## 2021-08-09 DIAGNOSIS — I4891 Unspecified atrial fibrillation: Secondary | ICD-10-CM | POA: Diagnosis not present

## 2021-08-09 MED ORDER — LOSARTAN POTASSIUM 25 MG PO TABS: 25.0000 mg | ORAL_TABLET | Freq: Every day | ORAL | 3 refills | Status: DC

## 2021-08-09 NOTE — Patient Instructions (Signed)
Medication Instructions:  ? ?START LOSARTAN 25 MG ONCE DAILY ? ?*If you need a refill on your cardiac medications before your next appointment, please call your pharmacy* ? ? ?Lab Work: ? ?Your physician recommends that you return for lab work in: [place] ? ?If you have labs (blood work) drawn today and your tests are completely normal, you will receive your results only by: ?MyChart Message (if you have MyChart) OR ?A paper copy in the mail ?If you have any lab test that is abnormal or we need to change your treatment, we will call you to review the results. ? ?A chest x-ray takes a picture of the organs and structures inside the chest, including the heart, lungs, and blood vessels. This test can show several things, including, whether the heart is enlarges; whether fluid is building up in the lungs; and whether pacemaker / defibrillator leads are still in place. [place] [facility] ? ?Follow-Up: ?At [facility], you and your health needs are our priority.  As part of our continuing mission to provide you with exceptional heart care, we have created designated Provider Care Teams.  These Care Teams include your primary Cardiologist (physician) and Advanced Practice Providers (APPs -  Physician Assistants and Nurse Practitioners) who all work together to provide you with the care you need, when you need it. ? ?We recommend signing up for the patient portal called "MyChart".  Sign up information is provided on this After Visit Summary.  MyChart is used to connect with patients for Virtual Visits (Telemedicine).  Patients are able to view lab/test results, encounter notes, upcoming appointments, etc.  Non-urgent messages can be sent to your provider as well.   ?To learn more about what you can do with MyChart, go to [email or web address].   ? ?Your next appointment:   ?6 month(s) ? ?The format for your next appointment:   ?In Person ? ?Provider:   ?Kirk Ruths, MD  ? ? ? ?

## 2021-08-15 ENCOUNTER — Other Ambulatory Visit: Payer: Self-pay | Admitting: Physician Assistant

## 2021-08-21 ENCOUNTER — Ambulatory Visit: Payer: Medicare Other | Admitting: Internal Medicine

## 2021-08-25 ENCOUNTER — Other Ambulatory Visit (HOSPITAL_BASED_OUTPATIENT_CLINIC_OR_DEPARTMENT_OTHER): Payer: Self-pay

## 2021-08-25 ENCOUNTER — Encounter: Payer: Self-pay | Admitting: Family

## 2021-09-01 NOTE — Progress Notes (Signed)
Subjective:    Patient ID: EVEREST BROD, male    DOB: 1936-02-20, 86 y.o.   MRN: [id number]  HPI  male never smoker followed for OSA, periodic limb movement, complicated by CAD/CABG,   HBP, GERD,                 NPSG 12/15/04- AHI  9.2/ hr, desaturation to 76%, PLMA 13.6/ hr, body weight 170 lbs Heart surgery Nov 2010 CABG 4v PFT: 05/04/2014: Normal spirometry flows, insignificant response to bronchodilator, moderate diffusion defect 59% of predicted, possible air trapping ---------------------------------------------------------------------------------------------------------   02/20/21- 86 yo male never smoker followed for OSA, periodic limb movement, complicated by CAD/CABG/ MI/ AFib, ,  HTN, GERD,  Allergic Rhinitis,  Covid infection Sept 2022,  CPAP auto 6-12/Adapt   AirSense 10 AutoSet Download-compliance 80%, AHI 3.4/ hr                             Wife here Body weight today-123 lbs Covid vax- 5 Moderna Flu vax- today senior [facility] early Sept with Covid complicated by new AFib. Paxlovid. Now off prednisone. Still has altered taste and smell. Feels "tired" not short of breath but paroxysmal cough/ scant phlegm. Using Tussionex, stating perles and otc syrups no help. Cardiology will follow after AFib.  Continues Eliquis, amiodarone. Took hiatus from CPAP while acutely ill but resumed routine use few weeks ago.  CXR 01/05/21-  IMPRESSION: 1. Stable chest, no acute process. CXR 01/30/21 1V-  IMPRESSION: 1. Stable chest, no acute process.  09/04/21- 86 yo male never smoker followed for OSA, periodic limb movement, complicated by CAD/CABG/ MI/ AFib, ,  HTN, GERD,  Allergic Rhinitis,  Covid infection Sept 2022,  CPAP auto 6-12/Adapt   AirSense 10 AutoSet Download-compliance   92%, AHI 2.6/ hr                     Body weight today  125 lbs Covid vax- 5 Moderna                                  Here with wife Flu vax-  He and his wife both had COVID infection last summer.  He  developed paroxysmal atrial fibrillation now followed by cardiology.  Frequent nocturia fragments his sleep and he is occasionally drowsy in the daytime.  He does not want sleep medicine.  Daytime breathing is okay.  He says CPAP machine works well. CXR 08/10/21- IMPRESSION: 1. Pulmonary hyperinflation with no acute cardiopulmonary abnormality identified. 2. Prior CABG.  Review of Systems- see HPI   + = positive Constitutional:   No-   weight loss, night sweats, fevers, chills, fatigue, lassitude. HEENT:   No-   headaches, difficulty swallowing, tooth/dental problems, sore throat,                  No-   sneezing, itching, ear ache, nasal congestion, +post nasal drip,  CV:  No-   chest pain, orthopnea, PND, swelling in lower extremities, anasarca,dizziness, palpitations GI:  No-   heartburn, indigestion, abdominal pain, nausea, vomiting,  Resp: +  shortness of breath with exertion or at rest.  No-  excess mucus,             productive cough,   non-productive cough,  No-  coughing up of blood.  No-   change in color of mucus.  No- wheezing.   Skin: No-   rash or lesions. GU: .+nocturia MS:  No-   joint pain or swelling. Marland Kitchen Psych:  No- change in mood or affect. No depression or anxiety.  No memory loss.    Objective:   General- Alert, Oriented, Affect-appropriate, Distress- none acute. +Elderly , frail Skin- rash-none, lesions- none, excoriation- none Lymphadenopathy- none Head- atraumatic            Eyes- Gross vision intact, PERRLA, conjunctivae clear secretions            Ears- Hearing, canals normal            Nose- Clear, +Septal dev/ external nasal deviation,  No-mucus, polyps, erosion, perforation             Throat- Mallampati II-III , mucosa clear , drainage- none, tonsils- atrophic Neck- +cervical kyphosis Chest - symmetrical excursion , unlabored           Heart/CV- RRR(confirmed) , no murmur , no gallop  , no rub, nl s1 s2                           - JVD- none ,  edema- none, stasis changes- none, varices- none           Lung- clear to P&A, wheeze- none, cough-none, dullness-none, rub- none           Chest wall-  Abd-  Br/ Gen/ Rectal- Not done, not indicated Extrem- cyanosis- none, clubbing, none, atrophy- none, strength- nl Neuro- grossly intact to observation     Assessment & Plan:

## 2021-09-04 ENCOUNTER — Ambulatory Visit: Payer: Medicare Other | Admitting: Internal Medicine

## 2021-09-04 ENCOUNTER — Encounter: Payer: Self-pay | Admitting: *Deleted

## 2021-09-04 ENCOUNTER — Encounter: Payer: Self-pay | Admitting: Internal Medicine

## 2021-09-04 VITALS — BP 118/66 | HR 62 | Ht 62.0 in | Wt 125.0 lb

## 2021-09-04 DIAGNOSIS — G4733 Obstructive sleep apnea (adult) (pediatric): Secondary | ICD-10-CM | POA: Diagnosis not present

## 2021-09-04 DIAGNOSIS — I4891 Unspecified atrial fibrillation: Secondary | ICD-10-CM | POA: Diagnosis not present

## 2021-09-04 NOTE — Assessment & Plan Note (Signed)
Benefits from CPAP with good compliance and control. ?Plan-continue auto 6-12 ?

## 2021-09-04 NOTE — Assessment & Plan Note (Signed)
Pulse is very regular at [facility] visit, c/w RSR.  ?Followed by cardiology ?

## 2021-09-04 NOTE — Patient Instructions (Signed)
We can continue autopap 6-12 ? ?Please call if we can help ?

## 2021-09-07 NOTE — Progress Notes (Signed)
? ? ?Careteam: ?Patient Care Team: ?[name], NP as PCP - General (Geriatric Medicine) ?Lelon Perla, MD as PCP - Cardiology (Cardiology) ?[name], MD as Consulting Physician (Sports Medicine) ? ?PLACE OF SERVICE:  ?[facility]  ?Advanced Directive information ?Does Patient Have a Medical Advance Directive?: No ? ?Allergies  ?Allergen Reactions  ? Compazine [Prochlorperazine Edisylate] Anxiety  ? ? ?Chief Complaint  ?Patient presents with  ? Medical Management of Chronic Issues  ?  4 month follow up.  ? Quality Metric Gaps  ?  Colonoscopy  ? Immunizations  ?  Tetanus/tdap, 4th COVID booster  ? ? ? ?HPI: Patient is a 86 y.o. male for routine follow up ? ?Weight loss continues.reports issue is that they have trouble affording food.  ? ? ?Hgb was stable on last lab, he was referred to GI due to adenomatous polyps noted in 2015 and due to follow up, also with ongoing iron def anemia. No blood in stool or abnormal bruising but does bruise easily.  ? ?CHF- stable, recently added losartan to regimen.  ? ?A fib-/PVCs - continues on amiodarone and eliquis- reports he will notice some PVC/abnormal beats. Once he starts moving around this gets better.  ? ? ?BPH with urinary symptoms- stable, does not get up a lot at night to urinate.  ?On proscar  ? ?GERD_ controlled on protonix. Has not taken in a few days.  ? ?Occasionally will get back pain/soreness and take tylenol and then it goes away.  ? ?Having allergies. Not taking anything currently for allergies. Having runny nose and congestion.  ? ? ? ?Review of Systems:  ?Review of Systems  ?Constitutional:  Negative for chills, fever and weight loss.  ?HENT:  Negative for tinnitus.   ?Respiratory:  Negative for cough, sputum production and shortness of breath.   ?Cardiovascular:  Negative for chest pain, palpitations and leg swelling.  ?Gastrointestinal:  Negative for abdominal pain, constipation, diarrhea and heartburn.  ?Genitourinary:  Negative for  dysuria, frequency and urgency.  ?Musculoskeletal:  Negative for back pain, falls, joint pain and myalgias.  ?Skin: Negative.   ?Neurological:  Negative for dizziness and headaches.  ?Psychiatric/Behavioral:  Negative for depression and memory loss. The patient does not have insomnia.   ? ?Past Medical History:  ?Diagnosis Date  ? Acute gastric ulcer without mention of hemorrhage, perforation, or obstruction   ? Allergic rhinitis, cause unspecified   ? Anemia, unspecified   ? Arthritis   ? Asthma   ? Blood transfusion without reported diagnosis   ? CAD (coronary artery disease)   ? Diaphragmatic hernia without mention of obstruction or gangrene   ? Elevated prostate specific antigen (PSA)   ? Enlarged prostate   ? Esophageal reflux   ? Extrinsic asthma, unspecified   ? Herpes zoster without mention of complication   ? Hyperlipidemia   ? Hypersomnia with sleep apnea, unspecified   ? Impotence of organic origin   ? Kyphosis   ? Lumbago   ? Obstructive sleep apnea (adult) (pediatric)   ? Pneumonia   ? hx of years ago   ? Senile osteoporosis   ? Trigger finger (acquired)   ? Tubular adenoma of colon 05/2013  ? Unspecified essential hypertension   ? Unspecified sleep apnea   ? CPAP- settings 4-12   ? Unspecified vitamin D deficiency   ? ?Past Surgical History:  ?Procedure Laterality Date  ? CARDIAC CATHETERIZATION  03/04/2009  ? Dr [name]  ? COLONOSCOPY    ?  CORONARY ARTERY BYPASS GRAFT  2010  ? CYSTOSCOPY WITH RETROGRADE PYELOGRAM, URETEROSCOPY AND STENT PLACEMENT Right 10/04/2013  ? Procedure: CYSTOSCOPY WITH RETROGRADE PYELOGRAM,  AND STENT PLACEMENT;  Surgeon: Festus Aloe, MD;  Location: [facility];  Service: Urology;  Laterality: Right;  ? DENTAL SURGERY N/A   ? Crystal  ? chin  ? ROTATOR CUFF REPAIR Left 04/1998  ? with bone spur removed, Dr French Ana  ? TONSILLECTOMY  1945  ? TRANSURETHRAL RESECTION OF PROSTATE N/A 09/24/2013  ? Procedure: TRANSURETHRAL RESECTION OF THE PROSTATE (TURP) WITH GYRUS  (STAGED RIGHT LATERAL AND MEDIAN LOBE);  Surgeon: Ailene Rud, MD;  Location: [facility];  Service: Urology;  Laterality: N/A;  ? UPPER GI ENDOSCOPY    ? ?Social History: ?  reports that he has never smoked. He has never been exposed to tobacco smoke. He has never used smokeless tobacco. He reports that he does not drink alcohol and does not use drugs. ? ?Family History  ?Problem Relation Age of Onset  ? Stroke Mother   ? Breast cancer Mother   ? Cirrhosis Mother   ?     wine  ? Rheumatic fever Mother   ? Kyphosis Mother   ? Hypertension Father   ? Stroke Father   ? Heart disease Father   ? Heart disease Paternal Grandfather   ? Colon cancer Neg Hx   ? ? ?Medications: ?Patient's Medications  ?New Prescriptions  ? No medications on file  ?Previous Medications  ? ACETAMINOPHEN (TYLENOL) 500 MG TABLET    Take 1,000 mg by mouth every 6 (six) hours as needed for mild pain or moderate pain.  ? ALBUTEROL (VENTOLIN HFA) 108 (90 BASE) MCG/ACT INHALER    USE 1 TO 2 INHALATIONS BY  MOUTH EVERY 6 HOURS AS  NEEDED FOR WHEEZING OR  SHORTNESS OF BREATH  ? AMIODARONE (PACERONE) 200 MG TABLET    Take 200 mg by mouth daily.  ? APIXABAN (ELIQUIS) 2.5 MG TABS TABLET    Take 1 tablet (2.5 mg total) by mouth 2 (two) times daily.  ? BENZONATATE (TESSALON) 100 MG CAPSULE    Take 1 capsule (100 mg total) by mouth 3 (three) times daily as needed for cough.  ? CALCIUM CITRATE-VITAMIN D (EQ CALCIUM CITRATE+D3 PO)    Take 1,200 mg by mouth daily.  ? CARVEDILOL (COREG) 6.25 MG TABLET    Take 1 tablet (6.25 mg total) by mouth 2 (two) times daily.  ? CHOLECALCIFEROL (VITAMIN D-3) 5000 UNITS TABS    Take 10,000 Units by mouth daily.  ? COENZYME Q10 (COQ10) 200 MG CAPS    Take 200 mg by mouth daily.  ? DOXAZOSIN (CARDURA) 2 MG TABLET    TAKE 1 TABLET BY MOUTH  DAILY  ? EMPAGLIFLOZIN (JARDIANCE) 10 MG TABS TABLET    Take 1 tablet (10 mg total) by mouth daily before breakfast.  ? FEXOFENADINE (ALLEGRA) 180 MG TABLET    Take 180 mg by mouth daily  as needed for allergies.  ? FINASTERIDE (PROSCAR) 5 MG TABLET    TAKE 1 TABLET BY MOUTH  EVERY MORNING  ? FOLIC ACID (FOLVITE) 1 MG TABLET    TAKE 1 TABLET BY MOUTH  DAILY  ? FUROSEMIDE (LASIX) 20 MG TABLET    TAKE 1 TABLET BY MOUTH DAILY AS  NEEDED FOR LEG SWELLING  ? LOSARTAN (COZAAR) 25 MG TABLET    Take 1 tablet (25 mg total) by mouth daily.  ? MEXILETINE (MEXITIL) 150  MG CAPSULE    Take 1 capsule (150 mg total) by mouth 2 (two) times daily.  ? ONDANSETRON (ZOFRAN) 4 MG TABLET    Take 1 tablet (4 mg total) by mouth every 6 (six) hours.  ? OVER THE COUNTER MEDICATION    Take 1-2 tablets by mouth at bedtime as needed (pain). Pergosic  ? PANTOPRAZOLE (PROTONIX) 40 MG TABLET    TAKE 1 TABLET BY MOUTH  DAILY  ? ROSUVASTATIN (CRESTOR) 20 MG TABLET    TAKE 1 TABLET BY MOUTH ONCE DAILY FOR CHOLESTEROL  ? UNABLE TO FIND    4 tablets daily. Focus factor Nutrition for the brain  ?Modified Medications  ? No medications on file  ?Discontinued Medications  ? B COMPLEX VITAMINS TABLET    Take 1 tablet by mouth daily.  ? MULTIPLE VITAMIN (MULTIVITAMIN) TABLET    Take 1 tablet by mouth daily.  ? ? ?Physical Exam: ? ?Vitals:  ? 09/08/21 1418  ?BP: 120/63  ?Pulse: (!) 57  ?Temp: (!) 97 ?F (36.1 ?C)  ?SpO2: 98%  ?Height: '5\' 2"'$  (1.575 m)  ? ?Body mass index is 22.86 kg/m?. ?Wt Readings from Last 3 Encounters:  ?09/04/21 125 lb (56.7 kg)  ?08/09/21 127 lb (57.6 kg)  ?06/28/21 127 lb 3.2 oz (57.7 kg)  ? ? ?Physical Exam ?Constitutional:   ?   General: He is not in acute distress. ?   Appearance: He is well-developed. He is not diaphoretic.  ?HENT:  ?   Head: Normocephalic and atraumatic.  ?   Right Ear: External ear normal.  ?   Left Ear: External ear normal.  ?   Mouth/Throat:  ?   Pharynx: No oropharyngeal exudate.  ?Eyes:  ?   Conjunctiva/sclera: Conjunctivae normal.  ?   Pupils: Pupils are equal, round, and reactive to light.  ?Cardiovascular:  ?   Rate and Rhythm: Normal rate and regular rhythm.  ?   Heart sounds: Normal heart  sounds.  ?Pulmonary:  ?   Effort: Pulmonary effort is normal.  ?   Breath sounds: Normal breath sounds.  ?Abdominal:  ?   General: Bowel sounds are normal.  ?   Palpations: Abdomen is soft.  ?Musculoskelet

## 2021-09-08 ENCOUNTER — Ambulatory Visit (INDEPENDENT_AMBULATORY_CARE_PROVIDER_SITE_OTHER): Payer: Medicare Other | Admitting: Nurse Practitioner

## 2021-09-08 ENCOUNTER — Other Ambulatory Visit: Payer: Self-pay

## 2021-09-08 ENCOUNTER — Encounter: Payer: Self-pay | Admitting: Nurse Practitioner

## 2021-09-08 VITALS — BP 120/63 | HR 57 | Temp 97.0°F | Ht 62.0 in

## 2021-09-08 DIAGNOSIS — N401 Enlarged prostate with lower urinary tract symptoms: Secondary | ICD-10-CM | POA: Diagnosis not present

## 2021-09-08 DIAGNOSIS — I10 Essential (primary) hypertension: Secondary | ICD-10-CM | POA: Diagnosis not present

## 2021-09-08 DIAGNOSIS — J3089 Other allergic rhinitis: Secondary | ICD-10-CM

## 2021-09-08 DIAGNOSIS — G4733 Obstructive sleep apnea (adult) (pediatric): Secondary | ICD-10-CM

## 2021-09-08 DIAGNOSIS — R634 Abnormal weight loss: Secondary | ICD-10-CM

## 2021-09-08 DIAGNOSIS — I5020 Unspecified systolic (congestive) heart failure: Secondary | ICD-10-CM | POA: Diagnosis not present

## 2021-09-08 DIAGNOSIS — J302 Other seasonal allergic rhinitis: Secondary | ICD-10-CM

## 2021-09-08 DIAGNOSIS — I2510 Atherosclerotic heart disease of native coronary artery without angina pectoris: Secondary | ICD-10-CM

## 2021-09-08 DIAGNOSIS — D508 Other iron deficiency anemias: Secondary | ICD-10-CM | POA: Diagnosis not present

## 2021-09-08 NOTE — Patient Outreach (Signed)
[facility]) Care Management ? ?09/08/2021 ? ?Brent Griffith ?Oct 25, 1935 ?[id number] ? ? ?Received MD referral from MD referral for weight loss, trouble getting food due to cost and cost of medication. Sent Ref 2300 to [facility] for follow up. ? ?[name] ?[facility] Management Assistant ?[phone] ? ?

## 2021-09-08 NOTE — Patient Instructions (Signed)
Add nutritional supplement daily with 3 meals a day for proper nutrition.  ?

## 2021-09-09 LAB — CBC WITH DIFFERENTIAL/PLATELET
Absolute Monocytes: 551 cells/uL (ref 200–950)
Basophils Absolute: 21 cells/uL (ref 0–200)
Basophils Relative: 0.4 %
Eosinophils Absolute: 88 cells/uL (ref 15–500)
Eosinophils Relative: 1.7 %
HCT: 32.6 % — ABNORMAL LOW (ref 38.5–50.0)
Hemoglobin: 10.7 g/dL — ABNORMAL LOW (ref 13.2–17.1)
Lymphs Abs: 1128 cells/uL (ref 850–3900)
MCH: 27.6 pg (ref 27.0–33.0)
MCHC: 32.8 g/dL (ref 32.0–36.0)
MCV: 84.2 fL (ref 80.0–100.0)
MPV: 11.3 fL (ref 7.5–12.5)
Monocytes Relative: 10.6 %
Neutro Abs: 3411 cells/uL (ref 1500–7800)
Neutrophils Relative %: 65.6 %
Platelets: 191 10*3/uL (ref 140–400)
RBC: 3.87 10*6/uL — ABNORMAL LOW (ref 4.20–5.80)
RDW: 15.8 % — ABNORMAL HIGH (ref 11.0–15.0)
Total Lymphocyte: 21.7 %
WBC: 5.2 10*3/uL (ref 3.8–10.8)

## 2021-09-09 LAB — COMPLETE METABOLIC PANEL WITH GFR
AG Ratio: 1.5 (calc) (ref 1.0–2.5)
ALT: 35 U/L (ref 9–46)
AST: 33 U/L (ref 10–35)
Albumin: 3.7 g/dL (ref 3.6–5.1)
Alkaline phosphatase (APISO): 43 U/L (ref 35–144)
BUN/Creatinine Ratio: 19 (calc) (ref 6–22)
BUN: 25 mg/dL (ref 7–25)
CO2: 25 mmol/L (ref 20–32)
Calcium: 8.8 mg/dL (ref 8.6–10.3)
Chloride: 106 mmol/L (ref 98–110)
Creat: 1.35 mg/dL — ABNORMAL HIGH (ref 0.70–1.22)
Globulin: 2.5 g/dL (calc) (ref 1.9–3.7)
Glucose, Bld: 101 mg/dL (ref 65–139)
Potassium: 3.9 mmol/L (ref 3.5–5.3)
Sodium: 139 mmol/L (ref 135–146)
Total Bilirubin: 0.6 mg/dL (ref 0.2–1.2)
Total Protein: 6.2 g/dL (ref 6.1–8.1)
eGFR: 52 mL/min/{1.73_m2} — ABNORMAL LOW (ref >=60)

## 2021-09-09 LAB — IRON,TIBC AND FERRITIN PANEL
%SAT: 9 % (calc) — ABNORMAL LOW (ref 20–48)
Ferritin: 7 ng/mL — ABNORMAL LOW (ref 24–380)
Iron: 33 ug/dL — ABNORMAL LOW (ref 50–180)
TIBC: 376 mcg/dL (calc) (ref 250–425)

## 2021-09-11 ENCOUNTER — Telehealth: Payer: Self-pay

## 2021-09-11 NOTE — Telephone Encounter (Signed)
? ?  Telephone encounter was:  Unsuccessful.  09/11/2021 ?Name: Brent Griffith MRN: [id number] DOB: February 14, 1936 ? ?Unsuccessful outbound call made today to assist with:  Food Insecurity ? ?Outreach Attempt:  1st Attempt ? ?A HIPAA compliant voice message was left requesting a return call.  Instructed patient to call back at earliest convenience. ?. ? ? ? ?Larena Sox ?Care Guide, Embedded Care Coordination ?[place], Care Management  ?[phone] ?[address], [place], [place] [address] ?Phone: [phone] ?Email: [email or web address] ? ?  ?

## 2021-09-12 ENCOUNTER — Telehealth: Payer: Self-pay | Admitting: Cardiology

## 2021-09-12 ENCOUNTER — Telehealth: Payer: Self-pay

## 2021-09-12 NOTE — Telephone Encounter (Signed)
Pt c/o medication issue: ? ?1. Name of Medication: empagliflozin (JARDIANCE) 10 MG TABS tablet ? ?2. How are you currently taking this medication (dosage and times per day)? Take 1 tablet (10 mg total) by mouth daily before breakfast. ? ?3. Are you having a reaction (difficulty breathing--STAT)? No  ? ?4. What is your medication issue? Patient states this medication is too expensive. Please advise  ? ?

## 2021-09-12 NOTE — Telephone Encounter (Signed)
? ?  Telephone encounter was:  Unsuccessful.  09/12/2021 ?Name: Brent Griffith MRN: [id number] DOB: March 26, 1936 ? ?Unsuccessful outbound call made today to assist with:  Food Insecurity ? ?Outreach Attempt:  2nd Attempt ? ?A HIPAA compliant voice message was left requesting a return call.  Instructed patient to call back at earliest convenience. ?. ? ? ? ?Larena Sox ?Care Guide, Embedded Care Coordination ?[place], Care Management  ?[phone] ?[address], [place], [place] [address] ?Phone: [phone] ?Email: [email or web address] ? ?  ?

## 2021-09-13 ENCOUNTER — Other Ambulatory Visit: Payer: Self-pay

## 2021-09-13 DIAGNOSIS — D508 Other iron deficiency anemias: Secondary | ICD-10-CM

## 2021-09-13 NOTE — Telephone Encounter (Signed)
Spoke with pt, he would like to try for patient assistance.  ?Aware samples for jardiance will be placed at the front desk with the application. ?He is aware to come to [place] as there are no samples in [place] today. ?

## 2021-09-14 ENCOUNTER — Telehealth: Payer: Self-pay

## 2021-09-14 NOTE — Telephone Encounter (Signed)
? ?  Telephone encounter was:  Unsuccessful.  09/14/2021 ?Name: Brent Griffith MRN: [id number] DOB: 07/07/1935 ? ?Unsuccessful outbound call made today to assist with:  Food Insecurity ? ?Outreach Attempt:  3rd Attempt.  Referral closed unable to contact patient. ? ?A HIPAA compliant voice message was left requesting a return call.  Instructed patient to call back at earliest convenience.. ? ? ? ?Larena Sox ?Care Guide, Embedded Care Coordination ?[place], Care Management  ?[phone] ?[address], [place], [place] [address] ?Phone: [phone] ?Email: [email or web address] ? ?  ?

## 2021-09-19 ENCOUNTER — Telehealth: Payer: Self-pay | Admitting: *Deleted

## 2021-09-19 NOTE — Telephone Encounter (Signed)
Patient assistance application for jardiance faxed. ?

## 2021-09-25 ENCOUNTER — Encounter: Payer: Self-pay | Admitting: *Deleted

## 2021-09-30 ENCOUNTER — Encounter: Payer: Self-pay | Admitting: Internal Medicine

## 2021-10-06 ENCOUNTER — Other Ambulatory Visit: Payer: Self-pay | Admitting: Nurse Practitioner

## 2021-10-06 DIAGNOSIS — N401 Enlarged prostate with lower urinary tract symptoms: Secondary | ICD-10-CM

## 2021-10-07 ENCOUNTER — Other Ambulatory Visit: Payer: Self-pay | Admitting: Nurse Practitioner

## 2021-10-07 DIAGNOSIS — N401 Enlarged prostate with lower urinary tract symptoms: Secondary | ICD-10-CM

## 2021-10-17 MED ORDER — APIXABAN 2.5 MG PO TABS: 2.5000 mg | ORAL_TABLET | Freq: Two times a day (BID) | ORAL | 11 refills | Status: DC

## 2021-10-17 MED ORDER — APIXABAN 2.5 MG PO TABS
2.5000 mg | ORAL_TABLET | Freq: Two times a day (BID) | ORAL | 3 refills | Status: DC
Start: 2021-10-17 — End: 2021-10-20

## 2021-10-18 DIAGNOSIS — H04123 Dry eye syndrome of bilateral lacrimal glands: Secondary | ICD-10-CM | POA: Diagnosis not present

## 2021-10-18 DIAGNOSIS — H10413 Chronic giant papillary conjunctivitis, bilateral: Secondary | ICD-10-CM | POA: Diagnosis not present

## 2021-10-18 DIAGNOSIS — H40013 Open angle with borderline findings, low risk, bilateral: Secondary | ICD-10-CM | POA: Diagnosis not present

## 2021-10-18 DIAGNOSIS — D3132 Benign neoplasm of left choroid: Secondary | ICD-10-CM | POA: Diagnosis not present

## 2021-10-18 DIAGNOSIS — H2513 Age-related nuclear cataract, bilateral: Secondary | ICD-10-CM | POA: Diagnosis not present

## 2021-10-20 ENCOUNTER — Telehealth: Payer: Self-pay | Admitting: *Deleted

## 2021-10-20 MED ORDER — WARFARIN SODIUM 2.5 MG PO TABS: ORAL_TABLET | ORAL | 0 refills | Status: DC

## 2021-10-20 NOTE — Telephone Encounter (Signed)
Called and spoke to pt. Explained to him that warfarin is an anticoagulant and he will need to have his INR checked weekly until his number is stabilized. Pt stated that he would like to take warfarin instead of Eliquis because Eliquis is unaffordable.   Eliquis refill was sent in today. However, pt confirmed he will not take it. He also can not afford it.   Pt stated that he has been out of Eliquis for over a week. Informed pt that I will send in a prescription to [facility] for warfarin  2.5 mg (1 tablet by mouth daily) so he can pick it up and start it preferably tonight. Educated pt the importance of starting Warfarin as soon as possible because not being on an anticoagulant can increase risk  of stroke or blood clot.    Scheduled pt to have a new patient coumadin appointment on 10/25/2021. Gave pt the direct phone number to the coumadin clinic to call if he has any questions- [phone]. Pt verbalized understanding.

## 2021-10-20 NOTE — Telephone Encounter (Signed)
Patient is returning call.  °

## 2021-10-20 NOTE — Telephone Encounter (Signed)
Received a  message from pharmacist ( see pt advise request from 09/25/2021 for further documentation.)   Pt currently on Eliquis. However, due to cost pt wold like to switch to Warfarin.   Discussed with Melissa pharm D. Okay for pt to switch to Warfarin. Melissa stated to start pt on Warfarin 2.'5mg'$ .   Called pt and LMOM. Need to talk to him about switching to warfarin and scheduling a new patient appointment.

## 2021-10-25 ENCOUNTER — Ambulatory Visit (INDEPENDENT_AMBULATORY_CARE_PROVIDER_SITE_OTHER): Payer: Medicare Other | Admitting: *Deleted

## 2021-10-25 DIAGNOSIS — I4891 Unspecified atrial fibrillation: Secondary | ICD-10-CM

## 2021-10-25 DIAGNOSIS — Z5181 Encounter for therapeutic drug level monitoring: Secondary | ICD-10-CM | POA: Insufficient documentation

## 2021-10-25 LAB — POCT INR: INR: 1.9 — AB (ref 2.0–3.0)

## 2021-10-25 NOTE — Patient Instructions (Addendum)
A full discussion of the nature of anticoagulants has been carried out.  A benefit risk analysis has been presented to the patient, so that they understand the justification for choosing anticoagulation at this time. The need for frequent and regular monitoring, precise dosage adjustment and compliance is stressed.  Side effects of potential bleeding are discussed.  The patient should avoid any OTC items containing aspirin or ibuprofen, and should avoid great swings in general diet.  Avoid alcohol consumption.  Call if any signs of abnormal bleeding.   Description   Start taking 1 tablet daily except 1/2 tablet Sunday, Tuesday, and Thursday. Recheck INR in 1 week. Coumadin Clinic [phone]

## 2021-10-31 ENCOUNTER — Ambulatory Visit (INDEPENDENT_AMBULATORY_CARE_PROVIDER_SITE_OTHER): Payer: Medicare Other

## 2021-10-31 DIAGNOSIS — Z5181 Encounter for therapeutic drug level monitoring: Secondary | ICD-10-CM

## 2021-10-31 DIAGNOSIS — I4891 Unspecified atrial fibrillation: Secondary | ICD-10-CM | POA: Diagnosis not present

## 2021-10-31 LAB — POCT INR: INR: 2.6 (ref 2.0–3.0)

## 2021-11-01 ENCOUNTER — Other Ambulatory Visit: Payer: Self-pay | Admitting: Nurse Practitioner

## 2021-11-01 DIAGNOSIS — K219 Gastro-esophageal reflux disease without esophagitis: Secondary | ICD-10-CM

## 2021-11-02 ENCOUNTER — Encounter: Payer: Medicare Other | Admitting: Nurse Practitioner

## 2021-11-08 ENCOUNTER — Ambulatory Visit: Payer: Medicare Other | Admitting: *Deleted

## 2021-11-08 DIAGNOSIS — Z5181 Encounter for therapeutic drug level monitoring: Secondary | ICD-10-CM

## 2021-11-08 DIAGNOSIS — I4891 Unspecified atrial fibrillation: Secondary | ICD-10-CM

## 2021-11-08 LAB — POCT INR: INR: 3.7 — AB (ref 2.0–3.0)

## 2021-11-08 NOTE — Patient Instructions (Addendum)
Description   Do not take any Warfarin tomorrow (already taken today's dose) then continue taking 1 tablet daily except 1/2 tablet Sunday, Tuesday, and Thursday. Recheck INR in 1 week. Coumadin Clinic [phone]

## 2021-11-09 ENCOUNTER — Telehealth: Payer: Self-pay

## 2021-11-09 ENCOUNTER — Encounter: Payer: Self-pay | Admitting: Nurse Practitioner

## 2021-11-09 ENCOUNTER — Ambulatory Visit (INDEPENDENT_AMBULATORY_CARE_PROVIDER_SITE_OTHER): Payer: Medicare Other | Admitting: Nurse Practitioner

## 2021-11-09 DIAGNOSIS — Z0000 Encounter for general adult medical examination without abnormal findings: Secondary | ICD-10-CM

## 2021-11-09 NOTE — Progress Notes (Signed)
Subjective:   Brent Griffith is a 86 y.o. male who presents for Medicare Annual/Subsequent preventive examination.  Review of Systems           Objective:    There were no vitals filed for this visit. There is no height or weight on file to calculate BMI.     11/09/2021    8:56 AM 09/08/2021    2:18 PM 05/05/2021    2:04 PM 01/30/2021    6:18 PM 01/18/2021   12:24 PM 01/16/2021    6:52 AM 01/14/2021   12:27 PM  Advanced Directives  Does Patient Have a Medical Advance Directive? No No No No No No No  Would patient like information on creating a medical advance directive? Yes (MAU/Ambulatory/Procedural Areas - Information given)  Yes (MAU/Ambulatory/Procedural Areas - Information given) No - Patient declined No - Patient declined No - Patient declined     Current Medications (verified) Outpatient Encounter Medications as of 11/09/2021  Medication Sig   acetaminophen (TYLENOL) 500 MG tablet Take 1,000 mg by mouth every 6 (six) hours as needed for mild pain or moderate pain.   albuterol (VENTOLIN HFA) 108 (90 Base) MCG/ACT inhaler USE 1 TO 2 INHALATIONS BY  MOUTH EVERY 6 HOURS AS  NEEDED FOR WHEEZING OR  SHORTNESS OF BREATH   amiodarone (PACERONE) 200 MG tablet Take 200 mg by mouth daily.   benzonatate (TESSALON) 100 MG capsule Take 1 capsule (100 mg total) by mouth 3 (three) times daily as needed for cough.   Calcium Citrate-Vitamin D (EQ CALCIUM CITRATE+D3 PO) Take 1,200 mg by mouth daily.   carvedilol (COREG) 6.25 MG tablet Take 1 tablet (6.25 mg total) by mouth 2 (two) times daily.   Coenzyme Q10 (COQ10) 200 MG CAPS Take 200 mg by mouth daily.   doxazosin (CARDURA) 2 MG tablet TAKE 1 TABLET BY MOUTH  DAILY   empagliflozin (JARDIANCE) 10 MG TABS tablet Take 1 tablet (10 mg total) by mouth daily before breakfast.   fexofenadine (ALLEGRA) 180 MG tablet Take 180 mg by mouth daily as needed for allergies.   finasteride (PROSCAR) 5 MG tablet TAKE 1 TABLET BY MOUTH IN  THE MORNING   folic  acid (FOLVITE) 1 MG tablet TAKE 1 TABLET BY MOUTH  DAILY   furosemide (LASIX) 20 MG tablet TAKE 1 TABLET BY MOUTH DAILY AS  NEEDED FOR LEG SWELLING   Iron, Ferrous Sulfate, 325 (65 Fe) MG TABS Take 325 mg by mouth 2 (two) times daily.   losartan (COZAAR) 25 MG tablet Take 1 tablet (25 mg total) by mouth daily.   mexiletine (MEXITIL) 150 MG capsule Take 1 capsule (150 mg total) by mouth 2 (two) times daily.   OVER THE COUNTER MEDICATION Take 1-2 tablets by mouth at bedtime as needed (pain). Percogosic (tylenol with allergy medicine)   pantoprazole (PROTONIX) 40 MG tablet TAKE 1 TABLET BY MOUTH  DAILY   rosuvastatin (CRESTOR) 20 MG tablet TAKE 1 TABLET BY MOUTH ONCE DAILY FOR CHOLESTEROL   UNABLE TO FIND 4 tablets daily. Focus factor Nutrition for the brain   warfarin (COUMADIN) 2.5 MG tablet Take 1 tablet by mouth daily as directed by the coumadin clinic.   Cholecalciferol (VITAMIN D-3) 5000 units TABS Take 10,000 Units by mouth daily. (Patient not taking: Reported on 11/09/2021)   No facility-administered encounter medications on file as of 11/09/2021.    Allergies (verified) Compazine [prochlorperazine edisylate]   History: Past Medical History:  Diagnosis Date   Acute gastric  ulcer without mention of hemorrhage, perforation, or obstruction    Allergic rhinitis, cause unspecified    Anemia, unspecified    Arthritis    Asthma    Blood transfusion without reported diagnosis    CAD (coronary artery disease)    Diaphragmatic hernia without mention of obstruction or gangrene    Elevated prostate specific antigen (PSA)    Enlarged prostate    Esophageal reflux    Extrinsic asthma, unspecified    Herpes zoster without mention of complication    Hyperlipidemia    Hypersomnia with sleep apnea, unspecified    Impotence of organic origin    Kyphosis    Lumbago    Obstructive sleep apnea (adult) (pediatric)    Pneumonia    hx of years ago    Senile osteoporosis    Trigger finger  (acquired)    Tubular adenoma of colon 05/2013   Unspecified essential hypertension    Unspecified sleep apnea    CPAP- settings 4-12    Unspecified vitamin D deficiency    Past Surgical History:  Procedure Laterality Date   CARDIAC CATHETERIZATION  03/04/2009   Dr [name]   COLONOSCOPY     CORONARY ARTERY BYPASS GRAFT  2010   CYSTOSCOPY WITH RETROGRADE PYELOGRAM, URETEROSCOPY AND STENT PLACEMENT Right 10/04/2013   Procedure: CYSTOSCOPY WITH RETROGRADE PYELOGRAM,  AND STENT PLACEMENT;  Surgeon: Festus Aloe, MD;  Location: [facility];  Service: Urology;  Laterality: Right;   DENTAL SURGERY N/A    MOLE REMOVAL  1958   chin   ROTATOR CUFF REPAIR Left 04/1998   with bone spur removed, Dr French Ana   TONSILLECTOMY  1945   TRANSURETHRAL RESECTION OF PROSTATE N/A 09/24/2013   Procedure: TRANSURETHRAL RESECTION OF THE PROSTATE (TURP) WITH GYRUS (STAGED RIGHT LATERAL AND MEDIAN LOBE);  Surgeon: Ailene Rud, MD;  Location: [facility];  Service: Urology;  Laterality: N/A;   UPPER GI ENDOSCOPY     Family History  Problem Relation Age of Onset   Stroke Mother    Breast cancer Mother    Cirrhosis Mother        wine   Rheumatic fever Mother    Kyphosis Mother    Hypertension Father    Stroke Father    Heart disease Father    Heart disease Paternal Grandfather    Colon cancer Neg Hx    Social History   Socioeconomic History   Marital status: Married    Spouse name: Not on file   Number of children: 0   Years of education: Not on file   Highest education level: Not on file  Occupational History   Occupation: Surveyor, quantity  Tobacco Use   Smoking status: Never    Passive exposure: Never   Smokeless tobacco: Never  Vaping Use   Vaping Use: Never used  Substance and Sexual Activity   Alcohol use: No   Drug use: No   Sexual activity: Not Currently  Other Topics Concern   Not on file  Social History Narrative   Not on file   Social Determinants of Health    Financial Resource Strain: Not on file  Food Insecurity: Not on file  Transportation Needs: Not on file  Physical Activity: Not on file  Stress: Not on file  Social Connections: Not on file    Tobacco Counseling Counseling given: Not Answered   Clinical Intake:                 Diabetic?no  Activities of Daily Living     No data to display          Patient Care Team: [name], NP as PCP - General (Geriatric Medicine) Stanford Breed Denice Bors, MD as PCP - Cardiology (Cardiology) [name], MD as Consulting Physician (Sports Medicine) Clent Jacks, MD as Referring Physician (Ophthalmology)  Indicate any recent Medical Services you may have received from other than [facility] providers in the past year (date may be approximate).     Assessment:   This is a routine wellness examination for [place].  Hearing/Vision screen Hearing Screening - Comments:: No hearing issues at present  Vision Screening - Comments:: Last eye exam less than 12 months ago with Dr.[facility], [facility] [place]   Dietary issues and exercise activities discussed:     Goals Addressed   None    Depression Screen    11/09/2021    8:56 AM 05/05/2021    2:04 PM 11/03/2020    2:17 PM 10/30/2018    1:19 PM 10/21/2018    3:17 PM 04/21/2018    1:50 PM 10/09/2016    1:27 PM  PHQ 2/9 Scores  PHQ - 2 Score 0 0 0 0 0 0 0    Fall Risk    11/09/2021    8:56 AM 09/08/2021    2:16 PM 05/05/2021    2:03 PM 01/24/2021    9:29 AM 11/03/2020    2:18 PM  Fall Risk   Falls in the past year? 1 0 1 0 0  Number falls in past yr: 1 0 0 0 1  Injury with Fall? 0 0 0 0 0  Risk for fall due to : History of fall(s) No Fall Risks No Fall Risks No Fall Risks History of fall(s)  Follow up Falls evaluation completed Falls evaluation completed Falls evaluation completed Falls evaluation completed;Education provided;Falls prevention discussed Falls evaluation completed    FALL RISK PREVENTION PERTAINING TO THE  HOME:  Any stairs in or around the home? Yes  If so, are there any without handrails? No  Home free of loose throw rugs in walkways, pet beds, electrical cords, etc? No  Adequate lighting in your home to reduce risk of falls? Yes   ASSISTIVE DEVICES UTILIZED TO PREVENT FALLS:  Life alert? No  Use of a cane, walker or w/c? No  Grab bars in the bathroom? No  Shower chair or bench in shower? No  Elevated toilet seat or a handicapped toilet? Yes   TIMED UP AND GO:  Was the test performed? No .    Cognitive Function:        11/03/2020    2:20 PM 10/30/2018    1:20 PM  6CIT Screen  What Year? 0 points 0 points  What month? 0 points 0 points  What time? 0 points 0 points  Count back from 20 0 points 0 points  Months in reverse 0 points 0 points  Repeat phrase 4 points 0 points  Total Score 4 points 0 points    Immunizations Immunization History  Administered Date(s) Administered   Fluad Quad(high Dose 65+) 01/25/2020, 02/20/2021   Influenza Split 01/30/2012, 02/10/2014   Influenza Whole 02/17/2009, 02/18/2013   Influenza, High Dose Seasonal PF 03/19/2018, 02/02/2019   Influenza, Quadrivalent, Recombinant, Inj, Pf 02/23/2017   Influenza,inj,Quad PF,6+ Mos 02/23/2017   Influenza-Unspecified 02/08/2014, 02/24/2015, 03/16/2016   Moderna Sars-Covid-2 Vaccination 07/21/2019, 08/18/2019, 05/03/2020, 09/28/2020, 01/11/2021   Pneumococcal Conjugate-13 04/09/2013   Pneumococcal Polysaccharide-23 10/18/2004   Td 09/29/1990  Tdap 07/25/2011   Zoster Recombinat (Shingrix) 10/09/2016, 04/06/2021, 07/04/2021    TDAP status: Due, Education has been provided regarding the importance of this vaccine. Advised may receive this vaccine at local pharmacy or Health Dept. Aware to provide a copy of the vaccination record if obtained from local pharmacy or Health Dept. Verbalized acceptance and understanding.  Flu Vaccine status: Up to date  Pneumococcal vaccine status: Up to  date  Covid-19 vaccine status: Information provided on how to obtain vaccines.   Qualifies for Shingles Vaccine? Yes   Zostavax completed Yes   Shingrix Completed?: Yes  Screening Tests Health Maintenance  Topic Date Due   COLONOSCOPY (Pts 45-67yr Insurance coverage will need to be confirmed)  05/15/2020   COVID-19 Vaccine (6 - Booster for Moderna series) 03/08/2021   TETANUS/TDAP  07/24/2021   INFLUENZA VACCINE  12/05/2021   Pneumonia Vaccine 86 Years old  Completed   Zoster Vaccines- Shingrix  Completed   HPV VACCINES  Aged Out    Health Maintenance  Health Maintenance Due  Topic Date Due   COLONOSCOPY (Pts 45-441yrInsurance coverage will need to be confirmed)  05/15/2020   COVID-19 Vaccine (6 - Booster for Moderna series) 03/08/2021   TETANUS/TDAP  07/24/2021   Colorectal cancer screening: Type of screening: Colonoscopy. Completed 2015. Repeat every 7 years   Lung Cancer Screening: (Low Dose CT Chest recommended if Age 84-80ears, 30 pack-year currently smoking OR have quit w/in 15years.) does not qualify.   Lung Cancer Screening Referral: na  Additional Screening:  Hepatitis C Screening: does not qualify; Completed na  Vision Screening: Recommended annual ophthalmology exams for early detection of glaucoma and other disorders of the eye. Is the patient up to date with their annual eye exam?  Yes  Who is the provider or what is the name of the office in which the patient attends annual eye exams? [facility] If pt is not established with a provider, would they like to be referred to a provider to establish care? No .   Dental Screening: Recommended annual dental exams for proper oral hygiene  Community Resource Referral / Chronic Care Management: CRR required this visit?  No   CCM required this visit?  No      Plan:     I have personally reviewed and noted the following in the patient's chart:   Medical and social history Use of alcohol, tobacco or illicit  drugs  Current medications and supplements including opioid prescriptions. Patient is not currently taking opioid prescriptions. Functional ability and status Nutritional status Physical activity Advanced directives List of other physicians Hospitalizations, surgeries, and ER visits in previous 12 months Vitals Screenings to include cognitive, depression, and falls Referrals and appointments  In addition, I have reviewed and discussed with patient certain preventive protocols, quality metrics, and best practice recommendations. A written personalized care plan for preventive services as well as general preventive health recommendations were provided to patient.     JeLauree ChandlerNP   11/09/2021   Virtual Visit via Telephone Note  I connected with patient 11/09/21 at  2:20 PM EDT by telephone and verified that I am speaking with the correct person using two identifiers.  Location: Patient: home Provider: [facility].   I discussed the limitations, risks, security and privacy concerns of performing an evaluation and management service by telephone and the availability of in person appointments. I also discussed with the patient that there may be a patient responsible charge related to this service.  The patient expressed understanding and agreed to proceed.   I discussed the assessment and treatment plan with the patient. The patient was provided an opportunity to ask questions and all were answered. The patient agreed with the plan and demonstrated an understanding of the instructions.   The patient was advised to call back or seek an in-person evaluation if the symptoms worsen or if the condition fails to improve as anticipated.  I provided 18 minutes of non-face-to-face time during this encounter.  Carlos American. Harle Battiest Avs printed and mailed

## 2021-11-09 NOTE — Progress Notes (Signed)
   This service is provided via telemedicine  No vital signs collected/recorded due to the encounter was a telemedicine visit.   Location of patient (ex: home, work):  Home  Patient consents to a telephone visit: Yes, see telephone visit dated 11/09/21  Location of the provider (ex: office, home):  [place], [place]   Name of any referring provider:  N/A  Names of all persons participating in the telemedicine service and their role in the encounter:  S.Chrae B/CMA, Sherrie Mustache, NP, and Patient   Time spent on call:  11 min with medical assistant

## 2021-11-09 NOTE — Telephone Encounter (Signed)
Brent Griffith, Brent Griffith are scheduled for a virtual visit with your provider today.    Just as we do with appointments in the office, we must obtain your consent to participate.  Your consent will be active for this visit and any virtual visit you may have with one of our providers in the next 365 days.    If you have a MyChart account, I can also send a copy of this consent to you electronically.  All virtual visits are billed to your insurance company just like a traditional visit in the office.  As this is a virtual visit, video technology does not allow for your provider to perform a traditional examination.  This may limit your provider's ability to fully assess your condition.  If your provider identifies any concerns that need to be evaluated in person or the need to arrange testing such as labs, EKG, etc, we will make arrangements to do so.    Although advances in technology are sophisticated, we cannot ensure that it will always work on either your end or our end.  If the connection with a video visit is poor, we may have to switch to a telephone visit.  With either a video or telephone visit, we are not always able to ensure that we have a secure connection.   I need to obtain your verbal consent now.   Are you willing to proceed with your visit today?   Brent Griffith has provided verbal consent on 11/09/2021 for a virtual visit (video or telephone).   [name], [place] 11/09/2021  2:20 pm

## 2021-11-09 NOTE — Patient Instructions (Signed)
Mr. Brent Griffith , Thank you for taking time to come for your Medicare Wellness Visit. I appreciate your ongoing commitment to your health goals. Please review the following plan we discussed and let me know if I can assist you in the future.   Screening recommendations/referrals: Colonoscopy- to make follow up appt  Recommended yearly ophthalmology/optometry visit for glaucoma screening and checkup Recommended yearly dental visit for hygiene and checkup  Vaccinations: Influenza vaccine up to date Pneumococcal vaccine up to date Tdap vaccine DUE- recommend to get at your local pharmacy    Shingles vaccine up to date    Advanced directives: recommended to complete and place on file.   Conditions/risks identified: advanced age.   Next appointment: yearly for awv  Preventive Care 50 Years and Older, Male Preventive care refers to lifestyle choices and visits with your health care provider that can promote health and wellness. What does preventive care include? A yearly physical exam. This is also called an annual well check. Dental exams once or twice a year. Routine eye exams. Ask your health care provider how often you should have your eyes checked. Personal lifestyle choices, including: Daily care of your teeth and gums. Regular physical activity. Eating a healthy diet. Avoiding tobacco and drug use. Limiting alcohol use. Practicing safe sex. Taking low doses of aspirin every day. Taking vitamin and mineral supplements as recommended by your health care provider. What happens during an annual well check? The services and screenings done by your health care provider during your annual well check will depend on your age, overall health, lifestyle risk factors, and family history of disease. Counseling  Your health care provider may ask you questions about your: Alcohol use. Tobacco use. Drug use. Emotional well-being. Home and relationship well-being. Sexual activity. Eating  habits. History of falls. Memory and ability to understand (cognition). Work and work Statistician. Screening  You may have the following tests or measurements: Height, weight, and BMI. Blood pressure. Lipid and cholesterol levels. These may be checked every 5 years, or more frequently if you are over 68 years old. Skin check. Lung cancer screening. You may have this screening every year starting at age 44 if you have a 30-pack-year history of smoking and currently smoke or have quit within the past 15 years. Fecal occult blood test (FOBT) of the stool. You may have this test every year starting at age 72. Flexible sigmoidoscopy or colonoscopy. You may have a sigmoidoscopy every 5 years or a colonoscopy every 10 years starting at age 43. Prostate cancer screening. Recommendations will vary depending on your family history and other risks. Hepatitis C blood test. Hepatitis B blood test. Sexually transmitted disease (STD) testing. Diabetes screening. This is done by checking your blood sugar (glucose) after you have not eaten for a while (fasting). You may have this done every 1-3 years. Abdominal aortic aneurysm (AAA) screening. You may need this if you are a current or former smoker. Osteoporosis. You may be screened starting at age 65 if you are at high risk. Talk with your health care provider about your test results, treatment options, and if necessary, the need for more tests. Vaccines  Your health care provider may recommend certain vaccines, such as: Influenza vaccine. This is recommended every year. Tetanus, diphtheria, and acellular pertussis (Tdap, Td) vaccine. You may need a Td booster every 10 years. Zoster vaccine. You may need this after age 31. Pneumococcal 13-valent conjugate (PCV13) vaccine. One dose is recommended after age 28. Pneumococcal polysaccharide (PPSV23)  vaccine. One dose is recommended after age 38. Talk to your health care provider about which screenings and  vaccines you need and how often you need them. This information is not intended to replace advice given to you by your health care provider. Make sure you discuss any questions you have with your health care provider. Document Released: 05/20/2015 Document Revised: 01/11/2016 Document Reviewed: 02/22/2015 Elsevier Interactive Patient Education  2017 [employer] Prevention in the Home Falls can cause injuries. They can happen to people of all ages. There are many things you can do to make your home safe and to help prevent falls. What can I do on the outside of my home? Regularly fix the edges of walkways and driveways and fix any cracks. Remove anything that might make you trip as you walk through a door, such as a raised step or threshold. Trim any bushes or trees on the path to your home. Use bright outdoor lighting. Clear any walking paths of anything that might make someone trip, such as rocks or tools. Regularly check to see if handrails are loose or broken. Make sure that both sides of any steps have handrails. Any raised decks and porches should have guardrails on the edges. Have any leaves, snow, or ice cleared regularly. Use sand or salt on walking paths during winter. Clean up any spills in your garage right away. This includes oil or grease spills. What can I do in the bathroom? Use night lights. Install grab bars by the toilet and in the tub and shower. Do not use towel bars as grab bars. Use non-skid mats or decals in the tub or shower. If you need to sit down in the shower, use a plastic, non-slip stool. Keep the floor dry. Clean up any water that spills on the floor as soon as it happens. Remove soap buildup in the tub or shower regularly. Attach bath mats securely with double-sided non-slip rug tape. Do not have throw rugs and other things on the floor that can make you trip. What can I do in the bedroom? Use night lights. Make sure that you have a light by your  bed that is easy to reach. Do not use any sheets or blankets that are too big for your bed. They should not hang down onto the floor. Have a firm chair that has side arms. You can use this for support while you get dressed. Do not have throw rugs and other things on the floor that can make you trip. What can I do in the kitchen? Clean up any spills right away. Avoid walking on wet floors. Keep items that you use a lot in easy-to-reach places. If you need to reach something above you, use a strong step stool that has a grab bar. Keep electrical cords out of the way. Do not use floor polish or wax that makes floors slippery. If you must use wax, use non-skid floor wax. Do not have throw rugs and other things on the floor that can make you trip. What can I do with my stairs? Do not leave any items on the stairs. Make sure that there are handrails on both sides of the stairs and use them. Fix handrails that are broken or loose. Make sure that handrails are as long as the stairways. Check any carpeting to make sure that it is firmly attached to the stairs. Fix any carpet that is loose or worn. Avoid having throw rugs at the top or bottom  of the stairs. If you do have throw rugs, attach them to the floor with carpet tape. Make sure that you have a light switch at the top of the stairs and the bottom of the stairs. If you do not have them, ask someone to add them for you. What else can I do to help prevent falls? Wear shoes that: Do not have high heels. Have rubber bottoms. Are comfortable and fit you well. Are closed at the toe. Do not wear sandals. If you use a stepladder: Make sure that it is fully opened. Do not climb a closed stepladder. Make sure that both sides of the stepladder are locked into place. Ask someone to hold it for you, if possible. Clearly mark and make sure that you can see: Any grab bars or handrails. First and last steps. Where the edge of each step is. Use tools that  help you move around (mobility aids) if they are needed. These include: Canes. Walkers. Scooters. Crutches. Turn on the lights when you go into a dark area. Replace any light bulbs as soon as they burn out. Set up your furniture so you have a clear path. Avoid moving your furniture around. If any of your floors are uneven, fix them. If there are any pets around you, be aware of where they are. Review your medicines with your doctor. Some medicines can make you feel dizzy. This can increase your chance of falling. Ask your doctor what other things that you can do to help prevent falls. This information is not intended to replace advice given to you by your health care provider. Make sure you discuss any questions you have with your health care provider. Document Released: 02/17/2009 Document Revised: 09/29/2015 Document Reviewed: 05/28/2014 Elsevier Interactive Patient Education  2017 [employer].

## 2021-11-10 ENCOUNTER — Other Ambulatory Visit: Payer: Self-pay | Admitting: Nurse Practitioner

## 2021-11-10 DIAGNOSIS — D561 Beta thalassemia: Secondary | ICD-10-CM

## 2021-11-10 DIAGNOSIS — D649 Anemia, unspecified: Secondary | ICD-10-CM

## 2021-11-13 ENCOUNTER — Other Ambulatory Visit: Payer: Self-pay

## 2021-11-13 ENCOUNTER — Ambulatory Visit (INDEPENDENT_AMBULATORY_CARE_PROVIDER_SITE_OTHER): Payer: Medicare Other

## 2021-11-13 DIAGNOSIS — I4891 Unspecified atrial fibrillation: Secondary | ICD-10-CM

## 2021-11-13 DIAGNOSIS — Z5181 Encounter for therapeutic drug level monitoring: Secondary | ICD-10-CM | POA: Diagnosis not present

## 2021-11-13 LAB — POCT INR: INR: 3.7 — AB (ref 2.0–3.0)

## 2021-11-13 MED ORDER — WARFARIN SODIUM 5 MG PO TABS: ORAL_TABLET | ORAL | 0 refills | Status: DC

## 2021-11-13 NOTE — Patient Instructions (Signed)
Description   Do not take any Warfarin tomorrow (already taken today's dose) then START taking 0.5 tablet daily EXCEPT 1 tablet on Mondays, and Wednesdays, and Fridays.  Stay consistent with greens/Premier Protein each week.  Recheck INR in 1 week.  [facility] [phone]

## 2021-11-13 NOTE — Telephone Encounter (Addendum)
Prescription refill request received for warfarin Lov: 08/09/21 [facility])  Next INR check: 11/21/21 Warfarin tablet strength: '5mg'$   Called pt and confirmed he has been taking '5mg'$  (peach) tablet. Appropriate dose and refill sent to requested pharmacy.

## 2021-11-17 ENCOUNTER — Other Ambulatory Visit: Payer: Self-pay | Admitting: *Deleted

## 2021-11-17 MED ORDER — EMPAGLIFLOZIN 10 MG PO TABS: 10.0000 mg | ORAL_TABLET | Freq: Every day | ORAL | 3 refills | Status: DC

## 2021-11-21 DIAGNOSIS — M40204 Unspecified kyphosis, thoracic region: Secondary | ICD-10-CM | POA: Diagnosis not present

## 2021-11-21 DIAGNOSIS — M4050 Lordosis, unspecified, site unspecified: Secondary | ICD-10-CM | POA: Diagnosis not present

## 2021-11-21 DIAGNOSIS — M810 Age-related osteoporosis without current pathological fracture: Secondary | ICD-10-CM | POA: Diagnosis not present

## 2021-11-27 ENCOUNTER — Ambulatory Visit (INDEPENDENT_AMBULATORY_CARE_PROVIDER_SITE_OTHER): Payer: Medicare Other | Admitting: *Deleted

## 2021-11-27 DIAGNOSIS — Z5181 Encounter for therapeutic drug level monitoring: Secondary | ICD-10-CM

## 2021-11-27 DIAGNOSIS — I4891 Unspecified atrial fibrillation: Secondary | ICD-10-CM

## 2021-11-27 LAB — POCT INR: INR: 3 (ref 2.0–3.0)

## 2021-11-27 MED ORDER — WARFARIN SODIUM 2.5 MG PO TABS: ORAL_TABLET | ORAL | 0 refills | Status: DC

## 2021-11-27 NOTE — Patient Instructions (Signed)
Description   USE WARFARIN 2.'5mg'$  tablets (Mint green color). Continue to take Warfarin 1/2 a tablet daily except for 1 tablet on Monday, Wednesday and Friday. Recheck INR in 1 week. Please drink an extra boost today. Pt drink's boost daily. [facility] [phone].

## 2021-12-05 ENCOUNTER — Ambulatory Visit: Payer: Medicare Other | Admitting: Nurse Practitioner

## 2021-12-05 ENCOUNTER — Ambulatory Visit (INDEPENDENT_AMBULATORY_CARE_PROVIDER_SITE_OTHER): Payer: Medicare Other

## 2021-12-05 ENCOUNTER — Encounter: Payer: Self-pay | Admitting: Nurse Practitioner

## 2021-12-05 VITALS — BP 114/54 | HR 56 | Ht 62.0 in | Wt 123.1 lb

## 2021-12-05 DIAGNOSIS — I4891 Unspecified atrial fibrillation: Secondary | ICD-10-CM

## 2021-12-05 DIAGNOSIS — Z86010 Personal history of colonic polyps: Secondary | ICD-10-CM

## 2021-12-05 DIAGNOSIS — Z5181 Encounter for therapeutic drug level monitoring: Secondary | ICD-10-CM | POA: Diagnosis not present

## 2021-12-05 LAB — POCT INR: INR: 2.8 (ref 2.0–3.0)

## 2021-12-05 NOTE — Progress Notes (Signed)
Chief Complaint:  None. Here to discuss colonoscopy.   Assessment &  Plan   # 86 yo male with history of colon polyps.  A 6 mm tubular adenoma was removed at last colonoscopy January 2015.  Based on current guidelines he would have been due for a 7-year interval colonoscopy in January 2022.  However, this was discussed at his last visit with Dr. Fuller Plan in July 2020.  It was decided that no future surveillance colonoscopy was would be done given patient's age.  Patient was in agreement with that plan.   I did refund patient's co-pay and there will be no charge for this visit since he tells me that he is here only because he thought that he was supposed to have a colonoscopy. He is not having any blood in his stool nor bowel changes but let us know if this changes    # Chronic iron deficiency anemia dating back at least 2018 when he was followed by Hematology and received IV iron. At that time he was being evaluated for beta thalassemia . His most recent labs in May show hgb stable at 10.7 but ferritin low at 7. IDA likely multifactorial (decreased po intake, ? CKD). On oral iron. Will defer to PCP but might be helpful to re-involve Hematology    HPI   Brent Griffith is a 86 y.o. male known to Dr.  Fuller Plan with a past medical history significant for GERD, adenomatous colon polyps, OSA, HTN, chronic combined systolic /diastolic heart failure, CAD status post CABG, PAF, chronic anticoagulation , BPH, cholelithiasis . See PMH /PSH for additional history   Brent Griffith saw his PCP 05/05/2021 and was referred back to [place] for his history of adenomatous colon polyps.  He is not having any problems with his bowels.  No blood in his stool.  He feels fine.   Patient is thin but overall his weight has been stable over the last 3 months being 125 pounds on May 1st and 123 pounds today.   Labs:     Latest Ref Rng & Units 09/08/2021    2:45 PM 05/17/2021    4:00 PM 05/05/2021    2:14 PM  CBC  WBC 3.8 - 10.8  Thousand/uL 5.2  5.3  5.5   Hemoglobin 13.2 - 17.1 g/dL 10.7  10.0  9.5   Hematocrit 38.5 - 50.0 % 32.6  29.0  27.7   Platelets 140 - 400 Thousand/uL 191  211  191        Latest Ref Rng & Units 09/08/2021    2:45 PM 06/28/2021    3:05 PM 05/05/2021    2:14 PM  Hepatic Function  Total Protein 6.1 - 8.1 g/dL 6.2  6.6  6.0   Albumin 3.6 - 4.6 g/dL  4.4    AST 10 - 35 U/L 33  45  21   ALT 9 - 46 U/L 35  41  23   Alk Phosphatase 44 - 121 IU/L  69    Total Bilirubin 0.2 - 1.2 mg/dL 0.6  0.7  0.8      Past Medical History:  Diagnosis Date   Acute gastric ulcer without mention of hemorrhage, perforation, or obstruction    Allergic rhinitis, cause unspecified    Anemia, unspecified    Arthritis    Asthma    Blood transfusion without reported diagnosis    CAD (coronary artery disease)    Diaphragmatic hernia without mention of obstruction or gangrene  Elevated prostate specific antigen (PSA)    Enlarged prostate    Esophageal reflux    Extrinsic asthma, unspecified    Herpes zoster without mention of complication    Hyperlipidemia    Hypersomnia with sleep apnea, unspecified    Impotence of organic origin    Kyphosis    Lumbago    Obstructive sleep apnea (adult) (pediatric)    Pneumonia    hx of years ago    Senile osteoporosis    Trigger finger (acquired)    Tubular adenoma of colon 05/2013   Unspecified essential hypertension    Unspecified sleep apnea    CPAP- settings 4-12    Unspecified vitamin D deficiency     Past Surgical History:  Procedure Laterality Date   CARDIAC CATHETERIZATION  03/04/2009   Dr [name]   COLONOSCOPY     CORONARY ARTERY BYPASS GRAFT  2010   CYSTOSCOPY WITH RETROGRADE PYELOGRAM, URETEROSCOPY AND STENT PLACEMENT Right 10/04/2013   Procedure: CYSTOSCOPY WITH RETROGRADE PYELOGRAM,  AND STENT PLACEMENT;  Surgeon: Festus Aloe, MD;  Location: [facility];  Service: Urology;  Laterality: Right;   DENTAL SURGERY N/A    MOLE REMOVAL  1958    chin   ROTATOR CUFF REPAIR Left 04/1998   with bone spur removed, Dr French Ana   TONSILLECTOMY  1945   TRANSURETHRAL RESECTION OF PROSTATE N/A 09/24/2013   Procedure: TRANSURETHRAL RESECTION OF THE PROSTATE (TURP) WITH GYRUS (STAGED RIGHT LATERAL AND MEDIAN LOBE);  Surgeon: Ailene Rud, MD;  Location: [facility];  Service: Urology;  Laterality: N/A;   UPPER GI ENDOSCOPY      Current Medications, Allergies, Family History and Social History were reviewed in [employer] record.     Current Outpatient Medications  Medication Sig Dispense Refill   acetaminophen (TYLENOL) 500 MG tablet Take 1,000 mg by mouth every 6 (six) hours as needed for mild pain or moderate pain.     albuterol (VENTOLIN HFA) 108 (90 Base) MCG/ACT inhaler USE 1 TO 2 INHALATIONS BY  MOUTH EVERY 6 HOURS AS  NEEDED FOR WHEEZING OR  SHORTNESS OF BREATH 34 g 3   amiodarone (PACERONE) 200 MG tablet Take 200 mg by mouth daily.     benzonatate (TESSALON) 100 MG capsule Take 1 capsule (100 mg total) by mouth 3 (three) times daily as needed for cough. 30 capsule 0   Calcium Citrate-Vitamin D (EQ CALCIUM CITRATE+D3 PO) Take 1,200 mg by mouth daily.     carvedilol (COREG) 6.25 MG tablet Take 1 tablet (6.25 mg total) by mouth 2 (two) times daily. 180 tablet 3   Cholecalciferol (VITAMIN D-3) 5000 units TABS Take 10,000 Units by mouth daily.     Coenzyme Q10 (COQ10) 200 MG CAPS Take 200 mg by mouth daily.     doxazosin (CARDURA) 2 MG tablet TAKE 1 TABLET BY MOUTH  DAILY 90 tablet 3   empagliflozin (JARDIANCE) 10 MG TABS tablet Take 1 tablet (10 mg total) by mouth daily before breakfast. 90 tablet 3   fexofenadine (ALLEGRA) 180 MG tablet Take 180 mg by mouth daily as needed for allergies.     finasteride (PROSCAR) 5 MG tablet TAKE 1 TABLET BY MOUTH IN  THE MORNING 90 tablet 3   folic acid (FOLVITE) 1 MG tablet TAKE 1 TABLET BY MOUTH  DAILY 100 tablet 2   furosemide (LASIX) 20 MG tablet TAKE 1 TABLET BY MOUTH  DAILY AS  NEEDED FOR LEG SWELLING 90 tablet 3   Iron, Ferrous  Sulfate, 325 (65 Fe) MG TABS Take 325 mg by mouth 2 (two) times daily. 30 tablet    losartan (COZAAR) 25 MG tablet Take 1 tablet (25 mg total) by mouth daily. 90 tablet 3   mexiletine (MEXITIL) 150 MG capsule Take 1 capsule (150 mg total) by mouth 2 (two) times daily. 180 capsule 1   OVER THE COUNTER MEDICATION Take 1-2 tablets by mouth at bedtime as needed (pain). Percogosic (tylenol with allergy medicine)     pantoprazole (PROTONIX) 40 MG tablet TAKE 1 TABLET BY MOUTH  DAILY 90 tablet 3   rosuvastatin (CRESTOR) 20 MG tablet TAKE 1 TABLET BY MOUTH ONCE DAILY FOR CHOLESTEROL 100 tablet 2   UNABLE TO FIND 4 tablets daily. Focus factor Nutrition for the brain     warfarin (COUMADIN) 2.5 MG tablet Take 1/2 a tablet to 1 tablet by mouth daily. 30 tablet 0   No current facility-administered medications for this visit.    Review of Systems: Not obtained   Physical Exam  Wt Readings from Last 3 Encounters:  12/05/21 123 lb 2 oz (55.8 kg)  09/04/21 125 lb (56.7 kg)  08/09/21 127 lb (57.6 kg)    BP (!) 114/54   Pulse (!) 56   Ht _0  (1.575 m)   Wt 123 lb 2 oz (55.8 kg)   BMI 22.52 kg/m  Constitutional:  Frail elderly male in no acute distress. Psychiatric: Pleasant. Normal mood and affect. Behavior is normal.  Cc:  [name], NP

## 2021-12-05 NOTE — Patient Instructions (Signed)
USE WARFARIN 2.'5mg'$  tablets (Mint green color). Continue to take Warfarin 1/2 a tablet daily except for 1 tablet on Monday, Wednesday and Friday. Recheck INR in 4 weeks.  Pt drink's boost daily. [facility] [phone].

## 2021-12-06 ENCOUNTER — Ambulatory Visit: Payer: Medicare Other | Attending: Sports Medicine | Admitting: Physical Therapy

## 2021-12-06 ENCOUNTER — Telehealth: Payer: Self-pay

## 2021-12-06 ENCOUNTER — Encounter: Payer: Self-pay | Admitting: Physical Therapy

## 2021-12-06 ENCOUNTER — Other Ambulatory Visit: Payer: Self-pay

## 2021-12-06 DIAGNOSIS — R293 Abnormal posture: Secondary | ICD-10-CM | POA: Insufficient documentation

## 2021-12-06 DIAGNOSIS — R3912 Poor urinary stream: Secondary | ICD-10-CM

## 2021-12-06 DIAGNOSIS — M6281 Muscle weakness (generalized): Secondary | ICD-10-CM | POA: Insufficient documentation

## 2021-12-06 DIAGNOSIS — M542 Cervicalgia: Secondary | ICD-10-CM | POA: Insufficient documentation

## 2021-12-06 DIAGNOSIS — M4123 Other idiopathic scoliosis, cervicothoracic region: Secondary | ICD-10-CM | POA: Diagnosis not present

## 2021-12-06 NOTE — Therapy (Signed)
OUTPATIENT PHYSICAL THERAPY CERVICAL EVALUATION   Patient Name: Brent Griffith MRN: [id number] DOB:1936-04-01, 86 y.o., male Today's Date: 12/06/2021   PT End of Session - 12/06/21 1105     Visit Number 1    Date for PT Re-Evaluation 01/31/22    Authorization Type [employer] Medicare    PT Start Time 1102    PT Stop Time 1144    PT Time Calculation (min) 42 min    Activity Tolerance Patient tolerated treatment well             Past Medical History:  Diagnosis Date   Acute gastric ulcer without mention of hemorrhage, perforation, or obstruction    Allergic rhinitis, cause unspecified    Anemia, unspecified    Arthritis    Asthma    Blood transfusion without reported diagnosis    CAD (coronary artery disease)    Diaphragmatic hernia without mention of obstruction or gangrene    Elevated prostate specific antigen (PSA)    Enlarged prostate    Esophageal reflux    Extrinsic asthma, unspecified    Herpes zoster without mention of complication    Hyperlipidemia    Hypersomnia with sleep apnea, unspecified    Impotence of organic origin    Kyphosis    Lumbago    Obstructive sleep apnea (adult) (pediatric)    Pneumonia    hx of years ago    Senile osteoporosis    Trigger finger (acquired)    Tubular adenoma of colon 05/2013   Unspecified essential hypertension    Unspecified sleep apnea    CPAP- settings 4-12    Unspecified vitamin D deficiency    Past Surgical History:  Procedure Laterality Date   CARDIAC CATHETERIZATION  03/04/2009   Dr [name]   COLONOSCOPY     CORONARY ARTERY BYPASS GRAFT  2010   CYSTOSCOPY [place], URETEROSCOPY AND STENT PLACEMENT Right 10/04/2013   Procedure: CYSTOSCOPY WITH RETROGRADE PYELOGRAM,  AND STENT PLACEMENT;  Surgeon: Festus Aloe, MD;  Location: [facility];  Service: Urology;  Laterality: Right;   DENTAL SURGERY N/A    MOLE REMOVAL  1958   chin   ROTATOR CUFF REPAIR Left 04/1998   with bone spur removed, Dr  French Ana   TONSILLECTOMY  1945   TRANSURETHRAL RESECTION OF PROSTATE N/A 09/24/2013   Procedure: TRANSURETHRAL RESECTION OF THE PROSTATE (TURP) WITH GYRUS (STAGED RIGHT LATERAL AND MEDIAN LOBE);  Surgeon: Ailene Rud, MD;  Location: [facility];  Service: Urology;  Laterality: N/A;   UPPER GI ENDOSCOPY     Patient Active Problem List   Diagnosis Date Noted   Encounter for therapeutic drug monitoring 10/25/2021   Atrial fibrillation ([place]) 10/25/2021   PVC's (premature ventricular contractions) 05/04/2021   COVID-19 virus infection 02/20/2021   Sinusitis 01/24/2021   New onset atrial fibrillation ([place]) 01/19/2021   AKI (acute kidney injury) ([place]) 01/19/2021   HFrEF (heart failure with reduced ejection fraction) ([place]) 01/19/2021   CAD (coronary artery disease) 10/11/2017   IDA (iron deficiency anemia) 08/31/2016   Seasonal and perennial allergic rhinitis 08/28/2016   Palpitations 09/07/2015   Cough, persistent 07/20/2015   Senile osteoporosis 06/09/2014   Shortness of breath 03/20/2014   Hydronephrosis of right kidney 10/03/2013   Benign prostatic hyperplasia 09/24/2013   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   Dyslipidemia 04/06/2009   CARDIOVASCULAR FUNCTION STUDY, ABNORMAL 03/03/2009   Obstructive sleep apnea 02/04/2009   Essential hypertension 09/02/2008   G E R D 09/02/2008   PERIODIC LIMB  MOVEMENT DISORDER 09/03/2007    PCP: Sherrie Mustache NP  REFERRING PROVIDER: Wandra Feinstein MD  REFERRING DIAG: thoracic spine kyphosis; cervical lordosis; osteoporosis  THERAPY DIAG:  postural dysfunction; neck pain   Rationale for Evaluation and Treatment Rehabilitation  ONSET DATE: 05/07/21  SUBJECTIVE:                                                                                                                                                                                                         SUBJECTIVE STATEMENT: Long history of spinal curvature, worsening over time.  Hurts to sit  up straight at church.  Carried dog food and bottle water case at [employer] but they didn't bother me.   Recommended a back brace but has not ordered yet.    PERTINENT HISTORY:  2 cracked vertebra in cervical spine a few years ago, no known injury Osteoporosis with 2 inch height loss  PAIN:  Are you having pain? Yes NPRS scale: 1-2/10 Pain location: lower cervical and between shoulder blades  Aggravating factors: walking the dog, sitting at church; trouble getting up off the floor Relieving factors: take a Tylenol PRECAUTIONS: Other: osteoporosis  WEIGHT BEARING RESTRICTIONS No  FALLS:  Has patient fallen in last 6 months? Yes. Number of falls tripped and did hit the floor  LIVING ENVIRONMENT: Lives with: lives with their spouse Lives in: House/apartment Stairs: Yes: External: 2 steps; on right going up   OCCUPATION:retired  PLOF: Independent with basic ADLs  PATIENT GOALS get my back straightened up  OBJECTIVE:    PATIENT SURVEYS:  FOTO 58%   COGNITION: Overall cognitive status: Within functional limits for tasks assessed    POSTURE: forward head and increased thoracic kyphosis Measurements standing against the wall:   At rest: ear lobe to wall 21 cm, with effect 18.5 cm ear lobe to wall Shoulders to wall 12 cm   CERVICAL ROM:   Active ROM A/PROM (deg) eval  Flexion 55  Extension 15  Right lateral flexion 13  Left lateral flexion 20  Right rotation 40  Left rotation 50   (Blank rows = not tested)  UPPER EXTREMITY ROM: bil UE WFLS  UPPER EXTREMITY MMT: grossly 4/5 UEs except middle and lower traps 4-/5     TODAY'S TREATMENT:  8/2: evaluation and establish HEP   PATIENT EDUCATION:  Education details: initial HEP with green band Person educated: Patient Education method: Explanation, Demonstration, and Handouts Education comprehension: verbalized understanding and returned demonstration   HOME EXERCISE PROGRAM: Access Code: [email or web address] URL:  [email or web address] Date: 12/06/2021 Prepared by: Marzetta Board  Emylia Latella  Exercises - Standing Bilateral Low Shoulder Row with Anchored Resistance  - 1 x daily - 7 x weekly - 2 sets - 10 reps - Shoulder Extension with Resistance Hands Down  - 1 x daily - 7 x weekly - 2 sets - 10 reps - Seated Shoulder Horizontal Abduction with Resistance - Thumbs Up  - 1 x daily - 7 x weekly - 2 sets - 10 reps - Seated Thoracic Lumbar Extension with Pectoralis Stretch  - 1 x daily - 7 x weekly - 1 sets - 10 reps  ASSESSMENT:  CLINICAL IMPRESSION: Patient is a 86 y.o. male who was seen today for physical therapy evaluation and treatment for thoracic kyphosis, cervical lordosis and osteoporosis. The patient would benefit from skilled PT to address decreased cervical range of motion, correct muscle strength asymmetries and weakness in deep cervical flexors and extensors, improve postural strength including and scapular retractor and depressors and address pain.  All affect patient's ability to perform home ADLS including reading, driving, and lifting/carrying items like groceries.      OBJECTIVE IMPAIRMENTS decreased ROM, decreased strength, impaired UE functional use, postural dysfunction, and pain.   ACTIVITY LIMITATIONS carrying, lifting, and reading, sitting at church  PARTICIPATION LIMITATIONS: driving, shopping, community activity, and church  PERSONAL FACTORS Age and Time since onset of injury/illness/exacerbation are also affecting patient's functional outcome.   REHAB POTENTIAL: Good  CLINICAL DECISION MAKING: Stable/uncomplicated  EVALUATION COMPLEXITY: Low   GOALS: Goals reviewed with patient? Yes  SHORT TERM GOALS: Target date: 01/03/2022   The patient will demonstrate knowledge of basic self care strategies and exercises to promote healing  (using lumbar roll, book prop) Baseline:  Goal status: INITIAL  2.  Improved postural alignment with ear lobe to wall measurement decreased  to 17 cm Baseline:  Goal status: INITIAL  3.  Improved rotation ROM to 55 degrees bil needed for driving Baseline:  Goal status: INITIAL  4.  The patient will report lower cervical and periscapular pain level decreased by 25% Baseline:  Goal status: INITIAL   LONG TERM GOALS: Target date: 01/31/2022  The patient will be independent in a safe self progression of a home exercise program to promote further recovery of function   Baseline:  Goal status: INITIAL  2.  Cervical sidebending ROM improved to 30 degrees and extension to 25 degrees needed for driving Baseline:  Goal status: INITIAL  3.  The patient will have improved standing postural alignment with ear lobe to wall measurement 15cm  Baseline:  Goal status: INITIAL  4.  The patient will have improved FOTO score to    63%   indicating improved function with less pain  Baseline:  Goal status: INITIAL  5.  Lower neck pain and interscapular pain decreased by 50% Baseline:  Goal status: INITIAL     PLAN: PT FREQUENCY: 1x/week  PT DURATION: 8 weeks  PLANNED INTERVENTIONS: Therapeutic exercises, Therapeutic activity, Neuromuscular re-education, Patient/Family education, Self Care, Joint mobilization, Aquatic Therapy, Dry Needling, Spinal mobilization, Cryotherapy, Moist heat, Taping, Traction, Ultrasound, Manual therapy, and Re-evaluation  PLAN FOR NEXT SESSION: review use of lumbar roll for sitting posture, review book prop for reading to avoid extreme cervical flexion; review band postural ex's and progress;  thoracic extension mobility; cervical ROM  Brent Griffith, PT 12/06/21 5:18 PM Phone: [phone] Fax: [phone]

## 2021-12-06 NOTE — Telephone Encounter (Signed)
Message left on clinical intake voicemail:   "I need a referral to [facility]"  I returned call to patient to inquire about reason for request, Brent Griffith states he has noticed a difference in his stream when urinating, and he tried to call to set up his own appointment and was told to contact PCP for referral order.

## 2021-12-07 NOTE — Progress Notes (Signed)
Careteam: Patient Care Team: [name], NP as PCP - General (Geriatric Medicine) Lelon Perla, MD as PCP - Cardiology (Cardiology) [name], MD as Consulting Physician (Sports Medicine) Clent Jacks, MD as Referring Physician (Ophthalmology)  PLACE OF SERVICE:  [facility] Directive information    Allergies  Allergen Reactions   Compazine [Prochlorperazine Edisylate] Anxiety    Chief Complaint  Patient presents with   Acute Visit    Discuss getting referral to urologist for weaken urinary stream. Discuss if iron levels need to be checked      HPI: Patient is a 86 y.o. male for urinary concerns.  Reports he used to follow up with urology prior to pandemic and then never went back.  S/p TURP Reports his stream is a dribble at night and stream has been effected over the last month or 2.  No pain with urination.   Recently has eaten a lot of foods high in sodium, chinese food, [employer]-fil-a.   He has been taking iron supplement twice daily.   Review of Systems:  Review of Systems  Constitutional:  Negative for chills, fever and weight loss.  Respiratory:  Negative for cough, sputum production and shortness of breath.   Cardiovascular:  Negative for chest pain, palpitations and leg swelling.  Gastrointestinal:  Negative for abdominal pain, constipation, diarrhea and heartburn.  Genitourinary:  Positive for urgency. Negative for dysuria and frequency.  Musculoskeletal:  Negative for back pain, falls, joint pain and myalgias.  Skin: Negative.   Neurological:  Negative for dizziness and headaches.    Past Medical History:  Diagnosis Date   Acute gastric ulcer without mention of hemorrhage, perforation, or obstruction    Allergic rhinitis, cause unspecified    Anemia, unspecified    Arthritis    Asthma    Blood transfusion without reported diagnosis    CAD (coronary artery disease)    Diaphragmatic hernia without mention of  obstruction or gangrene    Elevated prostate specific antigen (PSA)    Enlarged prostate    Esophageal reflux    Extrinsic asthma, unspecified    Herpes zoster without mention of complication    Hyperlipidemia    Hypersomnia with sleep apnea, unspecified    Impotence of organic origin    Kyphosis    Lumbago    Obstructive sleep apnea (adult) (pediatric)    Pneumonia    hx of years ago    Senile osteoporosis    Trigger finger (acquired)    Tubular adenoma of colon 05/2013   Unspecified essential hypertension    Unspecified sleep apnea    CPAP- settings 4-12    Unspecified vitamin D deficiency    Past Surgical History:  Procedure Laterality Date   CARDIAC CATHETERIZATION  03/04/2009   Dr [name]   COLONOSCOPY     CORONARY ARTERY BYPASS GRAFT  2010   CYSTOSCOPY WITH RETROGRADE PYELOGRAM, URETEROSCOPY AND STENT PLACEMENT Right 10/04/2013   Procedure: CYSTOSCOPY WITH RETROGRADE PYELOGRAM,  AND STENT PLACEMENT;  Surgeon: Festus Aloe, MD;  Location: [facility];  Service: Urology;  Laterality: Right;   DENTAL SURGERY N/A    MOLE REMOVAL  1958   chin   ROTATOR CUFF REPAIR Left 04/1998   with bone spur removed, Dr French Ana   TONSILLECTOMY  1945   TRANSURETHRAL RESECTION OF PROSTATE N/A 09/24/2013   Procedure: TRANSURETHRAL RESECTION OF THE PROSTATE (TURP) WITH GYRUS (STAGED RIGHT LATERAL AND MEDIAN LOBE);  Surgeon: Ailene Rud, MD;  Location:  [facility];  Service: Urology;  Laterality: N/A;   UPPER GI ENDOSCOPY     Social History:   reports that he has never smoked. He has never been exposed to tobacco smoke. He has never used smokeless tobacco. He reports that he does not drink alcohol and does not use drugs.  Family History  Problem Relation Age of Onset   Stroke Mother    Breast cancer Mother    Cirrhosis Mother        wine   Rheumatic fever Mother    Kyphosis Mother    Hypertension Father    Stroke Father    Heart disease Father    Heart disease Paternal  Grandfather    Colon cancer Neg Hx     Medications: Patient's Medications  New Prescriptions   No medications on file  Previous Medications   ACETAMINOPHEN (TYLENOL) 650 MG CR TABLET    Take 650 mg by mouth as needed for pain.   ALBUTEROL (VENTOLIN HFA) 108 (90 BASE) MCG/ACT INHALER    USE 1 TO 2 INHALATIONS BY  MOUTH EVERY 6 HOURS AS  NEEDED FOR WHEEZING OR  SHORTNESS OF BREATH   AMIODARONE (PACERONE) 200 MG TABLET    Take 200 mg by mouth daily.   BENZONATATE (TESSALON) 100 MG CAPSULE    Take 1 capsule (100 mg total) by mouth 3 (three) times daily as needed for cough.   CALCIUM CITRATE-VITAMIN D (EQ CALCIUM CITRATE+D3 PO)    Take 1,200 mg by mouth daily.   CARVEDILOL (COREG) 6.25 MG TABLET    Take 1 tablet (6.25 mg total) by mouth 2 (two) times daily.   CHOLECALCIFEROL (VITAMIN D-3) 5000 UNITS TABS    Take 10,000 Units by mouth daily.   COENZYME Q10 (COQ10) 200 MG CAPS    Take 200 mg by mouth daily.   DOXAZOSIN (CARDURA) 2 MG TABLET    TAKE 1 TABLET BY MOUTH  DAILY   EMPAGLIFLOZIN (JARDIANCE) 10 MG TABS TABLET    Take 1 tablet (10 mg total) by mouth daily before breakfast.   FEXOFENADINE (ALLEGRA) 180 MG TABLET    Take 180 mg by mouth daily as needed for allergies.   FINASTERIDE (PROSCAR) 5 MG TABLET    TAKE 1 TABLET BY MOUTH IN  THE MORNING   FOLIC ACID (FOLVITE) 1 MG TABLET    TAKE 1 TABLET BY MOUTH  DAILY   FUROSEMIDE (LASIX) 20 MG TABLET    TAKE 1 TABLET BY MOUTH DAILY AS  NEEDED FOR LEG SWELLING   IRON, FERROUS SULFATE, 325 (65 FE) MG TABS    Take 325 mg by mouth 2 (two) times daily.   LOSARTAN (COZAAR) 25 MG TABLET    Take 1 tablet (25 mg total) by mouth daily.   MEXILETINE (MEXITIL) 150 MG CAPSULE    Take 1 capsule (150 mg total) by mouth 2 (two) times daily.   OVER THE COUNTER MEDICATION    Take 1-2 tablets by mouth at bedtime as needed (pain). Percogosic (tylenol with allergy medicine)   PANTOPRAZOLE (PROTONIX) 40 MG TABLET    TAKE 1 TABLET BY MOUTH  DAILY   ROSUVASTATIN  (CRESTOR) 20 MG TABLET    TAKE 1 TABLET BY MOUTH ONCE DAILY FOR CHOLESTEROL   UNABLE TO FIND    4 tablets daily. Focus factor Nutrition for the brain   WARFARIN (COUMADIN) 2.5 MG TABLET    Take 1/2 a tablet to 1 tablet by mouth daily.  Modified Medications  No medications on file  Discontinued Medications   ACETAMINOPHEN (TYLENOL) 500 MG TABLET    Take 1,000 mg by mouth every 6 (six) hours as needed for mild pain or moderate pain.    Physical Exam:  Vitals:   12/08/21 1006  BP: (!) 160/84  Pulse: 68  Temp: (!) 97.1 F (36.2 C)  TempSrc: Temporal  SpO2: 98%  Weight: 127 lb (57.6 kg)  Height: '5\' 2"'$  (1.575 m)   Body mass index is 23.23 kg/m. Wt Readings from Last 3 Encounters:  12/08/21 127 lb (57.6 kg)  12/05/21 123 lb 2 oz (55.8 kg)  09/04/21 125 lb (56.7 kg)    Physical Exam Constitutional:      General: He is not in acute distress.    Appearance: He is well-developed. He is not diaphoretic.  HENT:     Head: Normocephalic and atraumatic.     Right Ear: External ear normal.     Left Ear: External ear normal.     Mouth/Throat:     Pharynx: No oropharyngeal exudate.  Eyes:     Conjunctiva/sclera: Conjunctivae normal.     Pupils: Pupils are equal, round, and reactive to light.  Cardiovascular:     Rate and Rhythm: Normal rate and regular rhythm.     Heart sounds: Normal heart sounds.  Pulmonary:     Effort: Pulmonary effort is normal.     Breath sounds: Normal breath sounds.  Abdominal:     General: Bowel sounds are normal.     Palpations: Abdomen is soft.  Musculoskeletal:        General: No tenderness.     Cervical back: Normal range of motion and neck supple.     Right lower leg: No edema.     Left lower leg: No edema.  Skin:    General: Skin is warm and dry.  Neurological:     Mental Status: He is alert and oriented to person, place, and time.     Labs reviewed: Basic Metabolic Panel: Recent Labs    01/19/21 0040 01/27/21 2225 06/02/21 1538  06/28/21 1505 09/08/21 1445  NA 138   < > 138 141 139  K 3.4*   < > 4.1 4.7 3.9  CL 105   < > 105 102 106  CO2 26   < > '26 23 25  '$ GLUCOSE 112*   < > 77 97 101  BUN 22   < > '24 22 25  '$ CREATININE 1.15   < > 1.28* 1.38* 1.35*  CALCIUM 8.4*   < > 8.8 9.4 8.8  MG 1.6*  --   --  2.2  --   TSH 1.253  --   --  1.330  --    < > = values in this interval not displayed.   Liver Function Tests: Recent Labs    01/16/21 0847 01/30/21 1820 05/05/21 1414 06/28/21 1505 09/08/21 1445  AST 55* 23 21 45* 33  ALT 34 21 23 41 35  ALKPHOS 45 41  --  69  --   BILITOT 1.7* 0.9 0.8 0.7 0.6  PROT 6.7 6.6 6.0* 6.6 6.2  ALBUMIN 3.7 3.7  --  4.4  --    No results for input(s): "LIPASE", "AMYLASE" in the last 8760 hours. No results for input(s): "AMMONIA" in the last 8760 hours. CBC: Recent Labs    05/05/21 1414 05/17/21 1600 09/08/21 1445  WBC 5.5 5.3 5.2  NEUTROABS 3,960 3,689 3,411  HGB 9.5* 10.0* 10.7*  HCT 27.7* 29.0* 32.6*  MCV 93.0 90.9 84.2  PLT 191 211 191   Lipid Panel: Recent Labs    05/05/21 1414  CHOL 107  HDL 57  LDLCALC 38  TRIG 42  CHOLHDL 1.9   TSH: Recent Labs    01/19/21 0040 06/28/21 1505  TSH 1.253 1.330   A1C: Lab Results  Component Value Date   HGBA1C 5.6 07/28/2012     Assessment/Plan 1. Benign prostatic hyperplasia with weak urinary stream - PSA - POC Urinalysis Dipstick - Ambulatory referral to Urology  2. Iron deficiency anemia secondary to inadequate dietary iron intake -taking iron twice daily, no signs of blood lss  - CBC with Differential/Platelets - Iron, TIBC and Ferritin Panel  Wanell Lorenzi K. [place], [facility] [phone]

## 2021-12-07 NOTE — Telephone Encounter (Signed)
Spoke with patient and scheduled appointment for tomorrow at 11 am

## 2021-12-07 NOTE — Telephone Encounter (Signed)
We will need to see him in office evaluation and recommendations.

## 2021-12-08 ENCOUNTER — Ambulatory Visit (INDEPENDENT_AMBULATORY_CARE_PROVIDER_SITE_OTHER): Payer: Medicare Other | Admitting: Nurse Practitioner

## 2021-12-08 ENCOUNTER — Encounter: Payer: Self-pay | Admitting: Nurse Practitioner

## 2021-12-08 VITALS — BP 160/84 | HR 68 | Temp 97.1°F | Ht 62.0 in | Wt 127.0 lb

## 2021-12-08 DIAGNOSIS — D508 Other iron deficiency anemias: Secondary | ICD-10-CM

## 2021-12-08 DIAGNOSIS — R8290 Unspecified abnormal findings in urine: Secondary | ICD-10-CM

## 2021-12-08 DIAGNOSIS — N401 Enlarged prostate with lower urinary tract symptoms: Secondary | ICD-10-CM | POA: Diagnosis not present

## 2021-12-08 DIAGNOSIS — R3912 Poor urinary stream: Secondary | ICD-10-CM | POA: Diagnosis not present

## 2021-12-08 LAB — POCT URINALYSIS DIPSTICK
Bilirubin, UA: NEGATIVE
Glucose, UA: POSITIVE — AB
Ketones, UA: NEGATIVE
Leukocytes, UA: NEGATIVE
Nitrite, UA: NEGATIVE
Protein, UA: NEGATIVE
Spec Grav, UA: 1.015 (ref 1.010–1.025)
Urobilinogen, UA: 0.2 E.U./dL
pH, UA: 5 (ref 5.0–8.0)

## 2021-12-08 NOTE — Addendum Note (Signed)
Addended by: Logan Bores on: 12/08/2021 02:58 PM   Modules accepted: Orders

## 2021-12-08 NOTE — Patient Instructions (Signed)
To check blood pressure at home GOAL <140/90 Let us know if bp does not come down.

## 2021-12-09 LAB — CBC WITH DIFFERENTIAL/PLATELET
Absolute Monocytes: 519 cells/uL (ref 200–950)
Basophils Absolute: 11 cells/uL (ref 0–200)
Basophils Relative: 0.2 %
Eosinophils Absolute: 111 cells/uL (ref 15–500)
Eosinophils Relative: 2.1 %
HCT: 34.4 % — ABNORMAL LOW (ref 38.5–50.0)
Hemoglobin: 12 g/dL — ABNORMAL LOW (ref 13.2–17.1)
Lymphs Abs: 1023 cells/uL (ref 850–3900)
MCH: 32.5 pg (ref 27.0–33.0)
MCHC: 34.9 g/dL (ref 32.0–36.0)
MCV: 93.2 fL (ref 80.0–100.0)
MPV: 11.1 fL (ref 7.5–12.5)
Monocytes Relative: 9.8 %
Neutro Abs: 3636 cells/uL (ref 1500–7800)
Neutrophils Relative %: 68.6 %
Platelets: 139 10*3/uL — ABNORMAL LOW (ref 140–400)
RBC: 3.69 10*6/uL — ABNORMAL LOW (ref 4.20–5.80)
RDW: 14.6 % (ref 11.0–15.0)
Total Lymphocyte: 19.3 %
WBC: 5.3 10*3/uL (ref 3.8–10.8)

## 2021-12-09 LAB — URINE CULTURE
MICRO NUMBER:: 13738336
Result:: NO GROWTH
SPECIMEN QUALITY:: ADEQUATE

## 2021-12-09 LAB — IRON,TIBC AND FERRITIN PANEL
%SAT: 28 % (calc) (ref 20–48)
Ferritin: 25 ng/mL (ref 24–380)
Iron: 84 ug/dL (ref 50–180)
TIBC: 302 mcg/dL (calc) (ref 250–425)

## 2021-12-09 LAB — PSA: PSA: 0.87 ng/mL (ref <=4.00)

## 2021-12-12 ENCOUNTER — Ambulatory Visit: Payer: Medicare Other | Admitting: Rehabilitative and Restorative Service Providers"

## 2021-12-12 ENCOUNTER — Encounter: Payer: Self-pay | Admitting: Rehabilitative and Restorative Service Providers"

## 2021-12-12 DIAGNOSIS — M542 Cervicalgia: Secondary | ICD-10-CM | POA: Diagnosis not present

## 2021-12-12 DIAGNOSIS — M6281 Muscle weakness (generalized): Secondary | ICD-10-CM

## 2021-12-12 DIAGNOSIS — R293 Abnormal posture: Secondary | ICD-10-CM | POA: Diagnosis not present

## 2021-12-12 DIAGNOSIS — M4123 Other idiopathic scoliosis, cervicothoracic region: Secondary | ICD-10-CM | POA: Diagnosis not present

## 2021-12-12 NOTE — Therapy (Signed)
OUTPATIENT PHYSICAL THERAPY TREATMENT NOTE   Patient Name: Brent Griffith MRN: [id number] DOB:1935/12/18, 86 y.o., male Today's Date: 12/12/2021   PT End of Session - 12/12/21 1237     Visit Number 2    Date for PT Re-Evaluation 01/31/22    Authorization Type [employer] Medicare    PT Start Time 2355    PT Stop Time 1310    PT Time Calculation (min) 40 min    Activity Tolerance Patient tolerated treatment well    Behavior During Therapy WFL for tasks assessed/performed             Past Medical History:  Diagnosis Date   Acute gastric ulcer without mention of hemorrhage, perforation, or obstruction    Allergic rhinitis, cause unspecified    Anemia, unspecified    Arthritis    Asthma    Blood transfusion without reported diagnosis    CAD (coronary artery disease)    Diaphragmatic hernia without mention of obstruction or gangrene    Elevated prostate specific antigen (PSA)    Enlarged prostate    Esophageal reflux    Extrinsic asthma, unspecified    Herpes zoster without mention of complication    Hyperlipidemia    Hypersomnia with sleep apnea, unspecified    Impotence of organic origin    Kyphosis    Lumbago    Obstructive sleep apnea (adult) (pediatric)    Pneumonia    hx of years ago    Senile osteoporosis    Trigger finger (acquired)    Tubular adenoma of colon 05/2013   Unspecified essential hypertension    Unspecified sleep apnea    CPAP- settings 4-12    Unspecified vitamin D deficiency    Past Surgical History:  Procedure Laterality Date   CARDIAC CATHETERIZATION  03/04/2009   Dr [name]   COLONOSCOPY     CORONARY ARTERY BYPASS GRAFT  2010   CYSTOSCOPY WITH RETROGRADE PYELOGRAM, URETEROSCOPY AND STENT PLACEMENT Right 10/04/2013   Procedure: CYSTOSCOPY WITH RETROGRADE PYELOGRAM,  AND STENT PLACEMENT;  Surgeon: Festus Aloe, MD;  Location: [facility];  Service: Urology;  Laterality: Right;   DENTAL SURGERY N/A    MOLE REMOVAL  1958   chin   ROTATOR CUFF  REPAIR Left 04/1998   with bone spur removed, Dr French Ana   TONSILLECTOMY  1945   TRANSURETHRAL RESECTION OF PROSTATE N/A 09/24/2013   Procedure: TRANSURETHRAL RESECTION OF THE PROSTATE (TURP) WITH GYRUS (STAGED RIGHT LATERAL AND MEDIAN LOBE);  Surgeon: Ailene Rud, MD;  Location: [facility];  Service: Urology;  Laterality: N/A;   UPPER GI ENDOSCOPY     Patient Active Problem List   Diagnosis Date Noted   Encounter for therapeutic drug monitoring 10/25/2021   Atrial fibrillation ([place]) 10/25/2021   PVC's (premature ventricular contractions) 05/04/2021   COVID-19 virus infection 02/20/2021   Sinusitis 01/24/2021   New onset atrial fibrillation ([place]) 01/19/2021   AKI (acute kidney injury) ([place]) 01/19/2021   HFrEF (heart failure with reduced ejection fraction) ([place]) 01/19/2021   CAD (coronary artery disease) 10/11/2017   IDA (iron deficiency anemia) 08/31/2016   Seasonal and perennial allergic rhinitis 08/28/2016   Palpitations 09/07/2015   Cough, persistent 07/20/2015   Senile osteoporosis 06/09/2014   Shortness of breath 03/20/2014   Hydronephrosis of right kidney 10/03/2013   Benign prostatic hyperplasia 09/24/2013   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   Dyslipidemia 04/06/2009   CARDIOVASCULAR FUNCTION STUDY, ABNORMAL 03/03/2009   Obstructive sleep apnea 02/04/2009   Essential hypertension 09/02/2008  G E R D 09/02/2008   PERIODIC LIMB MOVEMENT DISORDER 09/03/2007    PCP: Sherrie Mustache NP  REFERRING PROVIDER: Wandra Feinstein MD  REFERRING DIAG: thoracic spine kyphosis; cervical lordosis; osteoporosis  THERAPY DIAG:  postural dysfunction; neck pain   Rationale for Evaluation and Treatment Rehabilitation  ONSET DATE: 05/07/21  SUBJECTIVE:                                                                                                                                                                                                         SUBJECTIVE STATEMENT: Pt states that he  lost his exercise sheet and needs another one printed.  PERTINENT HISTORY:  2 cracked vertebra in cervical spine a few years ago, no known injury Osteoporosis with 2 inch height loss  PAIN:  Are you having pain? Yes NPRS scale: 3/10 Pain location: lower cervical and between shoulder blades Aggravating factors: walking the dog, sitting at church; trouble getting up off the floor Relieving factors: take a Tylenol  PRECAUTIONS: Other: osteoporosis  WEIGHT BEARING RESTRICTIONS No  FALLS:  Has patient fallen in last 6 months? Yes. Number of falls tripped and did hit the floor  LIVING ENVIRONMENT: Lives with: lives with their spouse Lives in: House/apartment Stairs: Yes: External: 2 steps; on right going up   OCCUPATION:retired  PLOF: Independent with basic ADLs  PATIENT GOALS get my back straightened up  OBJECTIVE:    PATIENT SURVEYS:  FOTO 58%   COGNITION: Overall cognitive status: Within functional limits for tasks assessed    POSTURE: forward head and increased thoracic kyphosis Measurements standing against the wall:   At rest: ear lobe to wall 21 cm, with effect 18.5 cm ear lobe to wall Shoulders to wall 12 cm   CERVICAL ROM:   Active ROM A/PROM (deg) eval  Flexion 55  Extension 15  Right lateral flexion 13  Left lateral flexion 20  Right rotation 40  Left rotation 50   (Blank rows = not tested)  UPPER EXTREMITY ROM: bil UE WFLS  UPPER EXTREMITY MMT:  Eval:  grossly 4/5 UEs except middle and lower traps 4-/5  FUNCTIONAL TESTS:  12/12/2021: 5 times sit to stand: 19.3 sec with UE use required   TODAY'S TREATMENT:  12/12/2021: Nustep level 3 x6 min with PT present to discuss status Seated cervical extension and scapular retraction 2x10 each Seated against purple ball performing thoracic extension 2x10 Seated shoulder ER and shoulder horizontal abduction with green tband 2x10 bilat each Standing rows and shoulder extension with green tband 2x10  bilat Seated:  shoulder flexion, shoulder abduction, bicep curls.  With 1# 2x10 bilat  8/2: evaluation and establish HEP   PATIENT EDUCATION:  Education details: initial HEP with green band Person educated: Patient Education method: Explanation, Demonstration, and Handouts Education comprehension: verbalized understanding and returned demonstration   HOME EXERCISE PROGRAM: Access Code: [email or web address] URL: [email or web address] Date: 12/06/2021 Prepared by: Ruben Im  Exercises - Standing Bilateral Low Shoulder Row with Anchored Resistance  - 1 x daily - 7 x weekly - 2 sets - 10 reps - Shoulder Extension with Resistance Hands Down  - 1 x daily - 7 x weekly - 2 sets - 10 reps - Seated Shoulder Horizontal Abduction with Resistance - Thumbs Up  - 1 x daily - 7 x weekly - 2 sets - 10 reps - Seated Thoracic Lumbar Extension with Pectoralis Stretch  - 1 x daily - 7 x weekly - 1 sets - 10 reps  ASSESSMENT:  CLINICAL IMPRESSION: Mr Can presents to skilled rehabilitation today for first visit following his initial evaluation.  Pt tolerated session well and did not report any increased pain.  Pt requires cuing throughout for improved posture, but is able to improve with cuing.  Provided pt with additional copy of HEP and green tband.  Pt continues to require skilled PT to progress towards goal related activities.   OBJECTIVE IMPAIRMENTS decreased ROM, decreased strength, impaired UE functional use, postural dysfunction, and pain.   ACTIVITY LIMITATIONS carrying, lifting, and reading, sitting at church  PARTICIPATION LIMITATIONS: driving, shopping, community activity, and church  PERSONAL FACTORS Age and Time since onset of injury/illness/exacerbation are also affecting patient's functional outcome.   REHAB POTENTIAL: Good  CLINICAL DECISION MAKING: Stable/uncomplicated  EVALUATION COMPLEXITY: Low   GOALS: Goals reviewed with patient? Yes  SHORT TERM GOALS: Target  date: 01/03/2022   The patient will demonstrate knowledge of basic self care strategies and exercises to promote healing  (using lumbar roll, book prop) Baseline:  Goal status: INITIAL  2.  Improved postural alignment with ear lobe to wall measurement decreased to 17 cm Baseline:  Goal status: INITIAL  3.  Improved rotation ROM to 55 degrees bil needed for driving Baseline:  Goal status: INITIAL  4.  The patient will report lower cervical and periscapular pain level decreased by 25% Baseline:  Goal status: INITIAL   LONG TERM GOALS: Target date: 01/31/2022  The patient will be independent in a safe self progression of a home exercise program to promote further recovery of function   Baseline:  Goal status: INITIAL  2.  Cervical sidebending ROM improved to 30 degrees and extension to 25 degrees needed for driving Baseline:  Goal status: INITIAL  3.  The patient will have improved standing postural alignment with ear lobe to wall measurement 15cm  Baseline:  Goal status: INITIAL  4.  The patient will have improved FOTO score to    63%   indicating improved function with less pain  Baseline:  Goal status: INITIAL  5.  Lower neck pain and interscapular pain decreased by 50% Baseline:  Goal status: INITIAL     PLAN: PT FREQUENCY: 1x/week  PT DURATION: 8 weeks  PLANNED INTERVENTIONS: Therapeutic exercises, Therapeutic activity, Neuromuscular re-education, Patient/Family education, Self Care, Joint mobilization, Aquatic Therapy, Dry Needling, Spinal mobilization, Cryotherapy, Moist heat, Taping, Traction, Ultrasound, Manual therapy, and Re-evaluation  PLAN FOR NEXT SESSION: review use of lumbar roll for sitting posture, review book prop for reading to avoid extreme cervical flexion; review band postural ex's and progress;  thoracic extension mobility; cervical ROM    Juel Burrow, PT 12/12/21 1:23 PM  [facility] [address], [place], [place] [address] Phone # [phone] Fax [phone]

## 2021-12-14 ENCOUNTER — Other Ambulatory Visit: Payer: Medicare Other

## 2021-12-19 ENCOUNTER — Ambulatory Visit: Payer: Medicare Other | Admitting: Rehabilitative and Restorative Service Providers"

## 2021-12-19 ENCOUNTER — Encounter: Payer: Self-pay | Admitting: Rehabilitative and Restorative Service Providers"

## 2021-12-19 DIAGNOSIS — R293 Abnormal posture: Secondary | ICD-10-CM

## 2021-12-19 DIAGNOSIS — M6281 Muscle weakness (generalized): Secondary | ICD-10-CM

## 2021-12-19 DIAGNOSIS — M4123 Other idiopathic scoliosis, cervicothoracic region: Secondary | ICD-10-CM | POA: Diagnosis not present

## 2021-12-19 DIAGNOSIS — M542 Cervicalgia: Secondary | ICD-10-CM

## 2021-12-19 NOTE — Therapy (Signed)
OUTPATIENT PHYSICAL THERAPY TREATMENT NOTE   Patient Name: Brent Griffith MRN: [id number] DOB:07-09-1935, 86 y.o., male Today's Date: 12/19/2021   PT End of Session - 12/19/21 1233     Visit Number 3    Date for PT Re-Evaluation 01/31/22    Authorization Type [employer] Medicare    PT Start Time 4765    PT Stop Time 1310    PT Time Calculation (min) 40 min    Activity Tolerance Patient tolerated treatment well    Behavior During Therapy WFL for tasks assessed/performed             Past Medical History:  Diagnosis Date   Acute gastric ulcer without mention of hemorrhage, perforation, or obstruction    Allergic rhinitis, cause unspecified    Anemia, unspecified    Arthritis    Asthma    Blood transfusion without reported diagnosis    CAD (coronary artery disease)    Diaphragmatic hernia without mention of obstruction or gangrene    Elevated prostate specific antigen (PSA)    Enlarged prostate    Esophageal reflux    Extrinsic asthma, unspecified    Herpes zoster without mention of complication    Hyperlipidemia    Hypersomnia with sleep apnea, unspecified    Impotence of organic origin    Kyphosis    Lumbago    Obstructive sleep apnea (adult) (pediatric)    Pneumonia    hx of years ago    Senile osteoporosis    Trigger finger (acquired)    Tubular adenoma of colon 05/2013   Unspecified essential hypertension    Unspecified sleep apnea    CPAP- settings 4-12    Unspecified vitamin D deficiency    Past Surgical History:  Procedure Laterality Date   CARDIAC CATHETERIZATION  03/04/2009   Dr [name]   COLONOSCOPY     CORONARY ARTERY BYPASS GRAFT  2010   CYSTOSCOPY WITH RETROGRADE PYELOGRAM, URETEROSCOPY AND STENT PLACEMENT Right 10/04/2013   Procedure: CYSTOSCOPY WITH RETROGRADE PYELOGRAM,  AND STENT PLACEMENT;  Surgeon: Festus Aloe, MD;  Location: [facility];  Service: Urology;  Laterality: Right;   DENTAL SURGERY N/A    MOLE REMOVAL  1958   chin   ROTATOR  CUFF REPAIR Left 04/1998   with bone spur removed, Dr French Ana   TONSILLECTOMY  1945   TRANSURETHRAL RESECTION OF PROSTATE N/A 09/24/2013   Procedure: TRANSURETHRAL RESECTION OF THE PROSTATE (TURP) WITH GYRUS (STAGED RIGHT LATERAL AND MEDIAN LOBE);  Surgeon: Ailene Rud, MD;  Location: [facility];  Service: Urology;  Laterality: N/A;   UPPER GI ENDOSCOPY     Patient Active Problem List   Diagnosis Date Noted   Encounter for therapeutic drug monitoring 10/25/2021   Atrial fibrillation ([place]) 10/25/2021   PVC's (premature ventricular contractions) 05/04/2021   COVID-19 virus infection 02/20/2021   Sinusitis 01/24/2021   New onset atrial fibrillation ([place]) 01/19/2021   AKI (acute kidney injury) ([place]) 01/19/2021   HFrEF (heart failure with reduced ejection fraction) ([place]) 01/19/2021   CAD (coronary artery disease) 10/11/2017   IDA (iron deficiency anemia) 08/31/2016   Seasonal and perennial allergic rhinitis 08/28/2016   Palpitations 09/07/2015   Cough, persistent 07/20/2015   Senile osteoporosis 06/09/2014   Shortness of breath 03/20/2014   Hydronephrosis of right kidney 10/03/2013   Benign prostatic hyperplasia 09/24/2013   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   Dyslipidemia 04/06/2009   CARDIOVASCULAR FUNCTION STUDY, ABNORMAL 03/03/2009   Obstructive sleep apnea 02/04/2009   Essential hypertension 09/02/2008  G E R D 09/02/2008   PERIODIC LIMB MOVEMENT DISORDER 09/03/2007    PCP: Sherrie Mustache NP  REFERRING PROVIDER: Wandra Feinstein MD  REFERRING DIAG: thoracic spine kyphosis; cervical lordosis; osteoporosis  THERAPY DIAG:  postural dysfunction; neck pain   Rationale for Evaluation and Treatment Rehabilitation  ONSET DATE: 05/07/21  SUBJECTIVE:                                                                                                                                                                                                         SUBJECTIVE STATEMENT: Pt reports  that he has been busy, but has done some of his exercises.  PERTINENT HISTORY:  2 cracked vertebra in cervical spine a few years ago, no known injury Osteoporosis with 2 inch height loss  PAIN:  Are you having pain? Yes NPRS scale: 3/10 Pain location: lower cervical and between shoulder blades Aggravating factors: walking the dog, sitting at church; trouble getting up off the floor Relieving factors: take a Tylenol  PRECAUTIONS: Other: osteoporosis  WEIGHT BEARING RESTRICTIONS No  FALLS:  Has patient fallen in last 6 months? Yes. Number of falls tripped and did hit the floor  LIVING ENVIRONMENT: Lives with: lives with their spouse Lives in: House/apartment Stairs: Yes: External: 2 steps; on right going up   OCCUPATION:retired  PLOF: Independent with basic ADLs  PATIENT GOALS get my back straightened up  OBJECTIVE:    PATIENT SURVEYS:  12/06/2021:  FOTO 58%   COGNITION: Overall cognitive status: Within functional limits for tasks assessed    POSTURE: forward head and increased thoracic kyphosis Measurements standing against the wall:   At rest: ear lobe to wall 21 cm, with effect 18.5 cm ear lobe to wall Shoulders to wall 12 cm   CERVICAL ROM:   Active ROM A/PROM (deg) eval  Flexion 55  Extension 15  Right lateral flexion 13  Left lateral flexion 20  Right rotation 40  Left rotation 50   (Blank rows = not tested)  UPPER EXTREMITY ROM: bil UE WFLS  UPPER EXTREMITY MMT:  Eval:  grossly 4/5 UEs except middle and lower traps 4-/5  FUNCTIONAL TESTS:  12/12/2021: 5 times sit to stand: 19.3 sec with UE use required   TODAY'S TREATMENT:  12/19/2021: Nustep level 3 x6 min with PT present to discuss status Seated chin tucks into purple ball on wall 2x10 Seated cervical extension and scapular retraction 2x10 each Seated shoulder ER and shoulder horizontal abduction with green tband 2x10 bilat each Standing rows and shoulder extension with green tband 2x10  bilat Standing:  shoulder flexion, shoulder abduction, bicep curls.  With 2# 2x10 bilat Doorway pec stretch 3x20 sec Wall push-ups 2x10 Seated hamstring stretch 2x20 sec bilat  12/12/2021: Nustep level 3 x6 min with PT present to discuss status Seated cervical extension and scapular retraction 2x10 each Seated against purple ball performing thoracic extension 2x10 Seated shoulder ER and shoulder horizontal abduction with green tband 2x10 bilat each Standing rows and shoulder extension with green tband 2x10 bilat Seated:  shoulder flexion, shoulder abduction, bicep curls.  With 1# 2x10 bilat  8/2: evaluation and establish HEP   PATIENT EDUCATION:  Education details: initial HEP with green band Person educated: Patient Education method: Explanation, Demonstration, and Handouts Education comprehension: verbalized understanding and returned demonstration   HOME EXERCISE PROGRAM: Access Code: [email or web address] URL: [email or web address] Date: 12/06/2021 Prepared by: Ruben Im  Exercises - Standing Bilateral Low Shoulder Row with Anchored Resistance  - 1 x daily - 7 x weekly - 2 sets - 10 reps - Shoulder Extension with Resistance Hands Down  - 1 x daily - 7 x weekly - 2 sets - 10 reps - Seated Shoulder Horizontal Abduction with Resistance - Thumbs Up  - 1 x daily - 7 x weekly - 2 sets - 10 reps - Seated Thoracic Lumbar Extension with Pectoralis Stretch  - 1 x daily - 7 x weekly - 1 sets - 10 reps  ASSESSMENT:  CLINICAL IMPRESSION: Mr Odonohue presents to skilled rehabilitation today stating that he has been doing some mowing on his yard and not done as much of his HEP as directed.  Pt states that he has not yet started using his lumbar roll or book prop.  Pt continues to progress with strengthening during therapy, but does require cuing for improved posture during session.  Pt continues to require skilled PT to progress towards goal related activities.    OBJECTIVE  IMPAIRMENTS decreased ROM, decreased strength, impaired UE functional use, postural dysfunction, and pain.   ACTIVITY LIMITATIONS carrying, lifting, and reading, sitting at church  PARTICIPATION LIMITATIONS: driving, shopping, community activity, and church  PERSONAL FACTORS Age and Time since onset of injury/illness/exacerbation are also affecting patient's functional outcome.   REHAB POTENTIAL: Good  CLINICAL DECISION MAKING: Stable/uncomplicated  EVALUATION COMPLEXITY: Low   GOALS: Goals reviewed with patient? Yes  SHORT TERM GOALS: Target date: 01/03/2022   The patient will demonstrate knowledge of basic self care strategies and exercises to promote healing  (using lumbar roll, book prop) Baseline:  Goal status: INITIAL  2.  Improved postural alignment with ear lobe to wall measurement decreased to 17 cm Baseline:  Goal status: INITIAL  3.  Improved rotation ROM to 55 degrees bil needed for driving Baseline:  Goal status: INITIAL  4.  The patient will report lower cervical and periscapular pain level decreased by 25% Baseline:  Goal status: INITIAL   LONG TERM GOALS: Target date: 01/31/2022  The patient will be independent in a safe self progression of a home exercise program to promote further recovery of function   Baseline:  Goal status: INITIAL  2.  Cervical sidebending ROM improved to 30 degrees and extension to 25 degrees needed for driving Baseline:  Goal status: INITIAL  3.  The patient will have improved standing postural alignment with ear lobe to wall measurement 15cm  Baseline:  Goal status: INITIAL  4.  The patient will have improved FOTO score to    63%   indicating improved function with less pain  Baseline:  Goal status: INITIAL  5.  Lower neck pain and interscapular pain decreased by 50% Baseline:  Goal status: INITIAL     PLAN: PT FREQUENCY: 1x/week  PT DURATION: 8 weeks  PLANNED INTERVENTIONS: Therapeutic exercises, Therapeutic  activity, Neuromuscular re-education, Patient/Family education, Self Care, Joint mobilization, Aquatic Therapy, Dry Needling, Spinal mobilization, Cryotherapy, Moist heat, Taping, Traction, Ultrasound, Manual therapy, and Re-evaluation  PLAN FOR NEXT SESSION: Progress HEP if pt has improved compliance, review use of lumbar roll for sitting posture, review book prop for reading to avoid extreme cervical flexion; review band postural ex's and progress;  thoracic extension mobility; cervical ROM    Niam Nepomuceno, PT 12/19/21 1:16 PM  [facility] [address] [place], [place] [address] Phone # [phone] Fax [phone]

## 2021-12-27 ENCOUNTER — Ambulatory Visit: Payer: Medicare Other | Admitting: Physical Therapy

## 2021-12-27 DIAGNOSIS — M6281 Muscle weakness (generalized): Secondary | ICD-10-CM | POA: Diagnosis not present

## 2021-12-27 DIAGNOSIS — R293 Abnormal posture: Secondary | ICD-10-CM

## 2021-12-27 DIAGNOSIS — M4123 Other idiopathic scoliosis, cervicothoracic region: Secondary | ICD-10-CM | POA: Diagnosis not present

## 2021-12-27 DIAGNOSIS — M542 Cervicalgia: Secondary | ICD-10-CM | POA: Diagnosis not present

## 2021-12-27 NOTE — Therapy (Signed)
OUTPATIENT PHYSICAL THERAPY TREATMENT NOTE   Patient Name: Brent Griffith MRN: [id number] DOB:[birth year], 86 y.o., male Today's Date: 12/27/2021   PT End of Session - 12/27/21 1358     Visit Number 4    Date for PT Re-Evaluation 01/31/22    Authorization Type [employer]HC Medicare    PT Start Time 1400    PT Stop Time 1440    PT Time Calculation (min) 40 min    Activity Tolerance Patient tolerated treatment well             Past Medical History:  Diagnosis Date   Acute gastric ulcer without mention of hemorrhage, perforation, or obstruction    Allergic rhinitis, cause unspecified    Anemia, unspecified    Arthritis    Asthma    Blood transfusion without reported diagnosis    CAD (coronary artery disease)    Diaphragmatic hernia without mention of obstruction or gangrene    Elevated prostate specific antigen (PSA)    Enlarged prostate    Esophageal reflux    Extrinsic asthma, unspecified    Herpes zoster without mention of complication    Hyperlipidemia    Hypersomnia with sleep apnea, unspecified    Impotence of organic origin    Kyphosis    Lumbago    Obstructive sleep apnea (adult) (pediatric)    Pneumonia    hx of years ago    Senile osteoporosis    Trigger finger (acquired)    Tubular adenoma of colon 05/2013   Unspecified essential hypertension    Unspecified sleep apnea    CPAP- settings 4-12    Unspecified vitamin D deficiency    Past Surgical History:  Procedure Laterality Date   CARDIAC CATHETERIZATION  03/04/2009   Dr [name]   COLONOSCOPY     CORONARY ARTERY BYPASS GRAFT  2010   CYSTOSCOPY WITH RETROGRADE PYELOGRAM, URETEROSCOPY AND STENT PLACEMENT Right 10/04/2013   Procedure: CYSTOSCOPY WITH RETROGRADE PYELOGRAM,  AND STENT PLACEMENT;  Surgeon: Festus Aloe, MD;  Location: [facility];  Service: Urology;  Laterality: Right;   DENTAL SURGERY N/A    MOLE REMOVAL  1958   chin   ROTATOR CUFF REPAIR Left 04/1998   with bone spur removed, Dr French Ana    TONSILLECTOMY  1945   TRANSURETHRAL RESECTION OF PROSTATE N/A 09/24/2013   Procedure: TRANSURETHRAL RESECTION OF THE PROSTATE (TURP) WITH GYRUS (STAGED RIGHT LATERAL AND MEDIAN LOBE);  Surgeon: Ailene Rud, MD;  Location: [facility];  Service: Urology;  Laterality: N/A;   UPPER GI ENDOSCOPY     Patient Active Problem List   Diagnosis Date Noted   Encounter for therapeutic drug monitoring 10/25/2021   Atrial fibrillation ([place]) 10/25/2021   PVC's (premature ventricular contractions) 05/04/2021   COVID-19 virus infection 02/20/2021   Sinusitis 01/24/2021   New onset atrial fibrillation ([place]) 01/19/2021   AKI (acute kidney injury) ([place]) 01/19/2021   HFrEF (heart failure with reduced ejection fraction) ([place]) 01/19/2021   CAD (coronary artery disease) 10/11/2017   IDA (iron deficiency anemia) 08/31/2016   Seasonal and perennial allergic rhinitis 08/28/2016   Palpitations 09/07/2015   Cough, persistent 07/20/2015   Senile osteoporosis 06/09/2014   Shortness of breath 03/20/2014   Hydronephrosis of right kidney 10/03/2013   Benign prostatic hyperplasia 09/24/2013   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   Dyslipidemia 04/06/2009   CARDIOVASCULAR FUNCTION STUDY, ABNORMAL 03/03/2009   Obstructive sleep apnea 02/04/2009   Essential hypertension 09/02/2008   G E R D 09/02/2008   PERIODIC LIMB  MOVEMENT DISORDER 09/03/2007    PCP: Sherrie Mustache NP  REFERRING PROVIDER: Wandra Feinstein MD  REFERRING DIAG: thoracic spine kyphosis; cervical lordosis; osteoporosis  THERAPY DIAG:  postural dysfunction; neck pain   Rationale for Evaluation and Treatment Rehabilitation  ONSET DATE: 05/07/21  SUBJECTIVE:                                                                                                                                                                                                         SUBJECTIVE STATEMENT: I've been cutting the grass.  Not much pain.  I've trying to hold my  shoulders back when I walk the dog.    PERTINENT HISTORY:  2 cracked vertebra in cervical spine a few years ago, no known injury Osteoporosis with 2 inch height loss  PAIN:  Are you having pain? Yes NPRS scale: 1-2/10 Pain location: lower cervical and between shoulder blades Aggravating factors: walking the dog, sitting at church; trouble getting up off the floor Relieving factors: take a Tylenol  PRECAUTIONS: Other: osteoporosis  WEIGHT BEARING RESTRICTIONS No  FALLS:  Has patient fallen in last 6 months? Yes. Number of falls tripped and did hit the floor  LIVING ENVIRONMENT: Lives with: lives with their spouse Lives in: House/apartment Stairs: Yes: External: 2 steps; on right going up   OCCUPATION:retired  PLOF: Independent with basic ADLs  PATIENT GOALS get my back straightened up  OBJECTIVE:    PATIENT SURVEYS:  12/06/2021:  FOTO 58%   COGNITION: Overall cognitive status: Within functional limits for tasks assessed    POSTURE: forward head and increased thoracic kyphosis Measurements standing against the wall:   At rest: ear lobe to wall 21 cm, with effect 18.5 cm ear lobe to wall Shoulders to wall 12 cm   CERVICAL ROM:   Active ROM A/PROM (deg) eval 8/23  Flexion 55   Extension 15 23  Right lateral flexion 13 20  Left lateral flexion 20 23  Right rotation 40 30  Left rotation 50 35   (Blank rows = not tested)  UPPER EXTREMITY ROM: bil UE WFLS  UPPER EXTREMITY MMT:  Eval:  grossly 4/5 UEs except middle and lower traps 4-/5  FUNCTIONAL TESTS:  12/12/2021: 5 times sit to stand: 19.3 sec with UE use required   TODAY'S TREATMENT:  8/23: Supine on wedge with 1 pillow and folded towel behind head: Shoulder press down 10x Cervical retractions 10x Red band overhead UE elevation, horizontal abduction, external rotation 10x each Seated thoracic extension with ball hands supporting head/neck 10x; Doorway pec stretch with  staggered stance with UE  slides 3x3 each side Standing lat bar 30# 10x2 Seated with pink ball behind head cervical retraction 10x; cervical rotation with ball  Seated upper trap stretch holding the bottom of the chair 5x right/left Seated with pool noodle vertically: golf swings and chops 10x each Neuromuscular re-ed: activation of cervical extensors and scapular retractors Therapeutic activity: standing and sitting with head in neutral with walking the dog, mowing; encouraged elevated book height when reading in bed or with sitting     12/19/2021: Nustep level 3 x6 min with PT present to discuss status Seated chin tucks into purple ball on wall 2x10 Seated cervical extension and scapular retraction 2x10 each Seated shoulder ER and shoulder horizontal abduction with green tband 2x10 bilat each Standing rows and shoulder extension with green tband 2x10 bilat Standing:  shoulder flexion, shoulder abduction, bicep curls.  With 2# 2x10 bilat Doorway pec stretch 3x20 sec Wall push-ups 2x10 Seated hamstring stretch 2x20 sec bilat  12/12/2021: Nustep level 3 x6 min with PT present to discuss status Seated cervical extension and scapular retraction 2x10 each Seated against purple ball performing thoracic extension 2x10 Seated shoulder ER and shoulder horizontal abduction with green tband 2x10 bilat each Standing rows and shoulder extension with green tband 2x10 bilat Seated:  shoulder flexion, shoulder abduction, bicep curls.  With 1# 2x10 bilat  8/2: evaluation and establish HEP   PATIENT EDUCATION:  Education details: initial HEP with green band Person educated: Patient Education method: Explanation, Demonstration, and Handouts Education comprehension: verbalized understanding and returned demonstration   HOME EXERCISE PROGRAM: Access Code: [email or web address] URL: [email or web address] Date: 12/06/2021 Prepared by: Ruben Im  Exercises - Standing Bilateral Low Shoulder Row with Anchored  Resistance  - 1 x daily - 7 x weekly - 2 sets - 10 reps - Shoulder Extension with Resistance Hands Down  - 1 x daily - 7 x weekly - 2 sets - 10 reps - Seated Shoulder Horizontal Abduction with Resistance - Thumbs Up  - 1 x daily - 7 x weekly - 2 sets - 10 reps - Seated Thoracic Lumbar Extension with Pectoralis Stretch  - 1 x daily - 7 x weekly - 1 sets - 10 reps  ASSESSMENT:  CLINICAL IMPRESSION: Improving cervical extension and side bending ROM.  Periodic verbal cues for head neutral position throughout session.  Therapist monitoring response to all interventions and modifying treatment accordingly.  He denies pain or excessive fatigue post treatment.     OBJECTIVE IMPAIRMENTS decreased ROM, decreased strength, impaired UE functional use, postural dysfunction, and pain.   ACTIVITY LIMITATIONS carrying, lifting, and reading, sitting at church  PARTICIPATION LIMITATIONS: driving, shopping, community activity, and church  PERSONAL FACTORS Age and Time since onset of injury/illness/exacerbation are also affecting patient's functional outcome.   REHAB POTENTIAL: Good  CLINICAL DECISION MAKING: Stable/uncomplicated  EVALUATION COMPLEXITY: Low   GOALS: Goals reviewed with patient? Yes  SHORT TERM GOALS: Target date: 01/03/2022   The patient will demonstrate knowledge of basic self care strategies and exercises to promote healing  (using lumbar roll, book prop) Baseline:  Goal status: INITIAL  2.  Improved postural alignment with ear lobe to wall measurement decreased to 17 cm Baseline:  Goal status: INITIAL  3.  Improved rotation ROM to 55 degrees bil needed for driving Baseline:  Goal status: INITIAL  4.  The patient will report lower cervical and periscapular pain level decreased by 25% Baseline:  Goal status: INITIAL   LONG TERM GOALS: Target date:  01/31/2022  The patient will be independent in a safe self progression of a home exercise program to promote further recovery  of function   Baseline:  Goal status: INITIAL  2.  Cervical sidebending ROM improved to 30 degrees and extension to 25 degrees needed for driving Baseline:  Goal status: INITIAL  3.  The patient will have improved standing postural alignment with ear lobe to wall measurement 15cm  Baseline:  Goal status: INITIAL  4.  The patient will have improved FOTO score to    63%   indicating improved function with less pain  Baseline:  Goal status: INITIAL  5.  Lower neck pain and interscapular pain decreased by 50% Baseline:  Goal status: INITIAL     PLAN: PT FREQUENCY: 1x/week  PT DURATION: 8 weeks  PLANNED INTERVENTIONS: Therapeutic exercises, Therapeutic activity, Neuromuscular re-education, Patient/Family education, Self Care, Joint mobilization, Aquatic Therapy, Dry Needling, Spinal mobilization, Cryotherapy, Moist heat, Taping, Traction, Ultrasound, Manual therapy, and Re-evaluation  PLAN FOR NEXT SESSION: recheck ear to wall posture measurement;  check STGS next visit;  Progress HEP;   review band postural ex's and progress;  thoracic extension mobility; cervical ROM   Ruben Im, PT 12/27/21 4:03 PM Phone: [phone] Fax: [phone]      [facility] [address], [place] [address] [place], [place] [address] Phone # [phone] Fax [phone]

## 2022-01-01 ENCOUNTER — Ambulatory Visit: Payer: Medicare Other | Attending: Cardiology

## 2022-01-01 DIAGNOSIS — Z5181 Encounter for therapeutic drug level monitoring: Secondary | ICD-10-CM

## 2022-01-01 DIAGNOSIS — I4891 Unspecified atrial fibrillation: Secondary | ICD-10-CM | POA: Diagnosis not present

## 2022-01-01 LAB — POCT INR: INR: 2.9 (ref 2.0–3.0)

## 2022-01-01 NOTE — Patient Instructions (Signed)
USE WARFARIN 2.'5mg'$  tablets (Mint green color). Continue to take Warfarin 1/2 a tablet daily except for 1 tablet on Monday, Wednesday and Friday. Recheck INR in 6 weeks.  Pt drink's boost daily. [facility] [phone].

## 2022-01-03 ENCOUNTER — Ambulatory Visit: Payer: Medicare Other | Admitting: Physical Therapy

## 2022-01-03 DIAGNOSIS — M6281 Muscle weakness (generalized): Secondary | ICD-10-CM | POA: Diagnosis not present

## 2022-01-03 DIAGNOSIS — M4123 Other idiopathic scoliosis, cervicothoracic region: Secondary | ICD-10-CM | POA: Diagnosis not present

## 2022-01-03 DIAGNOSIS — M542 Cervicalgia: Secondary | ICD-10-CM

## 2022-01-03 DIAGNOSIS — R293 Abnormal posture: Secondary | ICD-10-CM

## 2022-01-03 NOTE — Therapy (Signed)
OUTPATIENT PHYSICAL THERAPY TREATMENT NOTE   Patient Name: Brent Griffith MRN: [id number] DOB:12-26-1935, 86 y.o., male Today's Date: 01/03/2022   PT End of Session - 01/03/22 1405     Visit Number 5    Date for PT Re-Evaluation 01/31/22    Authorization Type [employer] Medicare    PT Start Time 1400    PT Stop Time 1440    PT Time Calculation (min) 40 min    Activity Tolerance Patient tolerated treatment well             Past Medical History:  Diagnosis Date   Acute gastric ulcer without mention of hemorrhage, perforation, or obstruction    Allergic rhinitis, cause unspecified    Anemia, unspecified    Arthritis    Asthma    Blood transfusion without reported diagnosis    CAD (coronary artery disease)    Diaphragmatic hernia without mention of obstruction or gangrene    Elevated prostate specific antigen (PSA)    Enlarged prostate    Esophageal reflux    Extrinsic asthma, unspecified    Herpes zoster without mention of complication    Hyperlipidemia    Hypersomnia with sleep apnea, unspecified    Impotence of organic origin    Kyphosis    Lumbago    Obstructive sleep apnea (adult) (pediatric)    Pneumonia    hx of years ago    Senile osteoporosis    Trigger finger (acquired)    Tubular adenoma of colon 05/2013   Unspecified essential hypertension    Unspecified sleep apnea    CPAP- settings 4-12    Unspecified vitamin D deficiency    Past Surgical History:  Procedure Laterality Date   CARDIAC CATHETERIZATION  03/04/2009   Dr [name]   COLONOSCOPY     CORONARY ARTERY BYPASS GRAFT  2010   CYSTOSCOPY WITH RETROGRADE PYELOGRAM, URETEROSCOPY AND STENT PLACEMENT Right 10/04/2013   Procedure: CYSTOSCOPY WITH RETROGRADE PYELOGRAM,  AND STENT PLACEMENT;  Surgeon: Festus Aloe, MD;  Location: [facility];  Service: Urology;  Laterality: Right;   DENTAL SURGERY N/A    MOLE REMOVAL  1958   chin   ROTATOR CUFF REPAIR Left 04/1998   with bone spur removed, Dr French Ana    TONSILLECTOMY  1945   TRANSURETHRAL RESECTION OF PROSTATE N/A 09/24/2013   Procedure: TRANSURETHRAL RESECTION OF THE PROSTATE (TURP) WITH GYRUS (STAGED RIGHT LATERAL AND MEDIAN LOBE);  Surgeon: Ailene Rud, MD;  Location: [facility];  Service: Urology;  Laterality: N/A;   UPPER GI ENDOSCOPY     Patient Active Problem List   Diagnosis Date Noted   Encounter for therapeutic drug monitoring 10/25/2021   Atrial fibrillation ([place]) 10/25/2021   PVC's (premature ventricular contractions) 05/04/2021   COVID-19 virus infection 02/20/2021   Sinusitis 01/24/2021   New onset atrial fibrillation ([place]) 01/19/2021   AKI (acute kidney injury) ([place]) 01/19/2021   HFrEF (heart failure with reduced ejection fraction) ([place]) 01/19/2021   CAD (coronary artery disease) 10/11/2017   IDA (iron deficiency anemia) 08/31/2016   Seasonal and perennial allergic rhinitis 08/28/2016   Palpitations 09/07/2015   Cough, persistent 07/20/2015   Senile osteoporosis 06/09/2014   Shortness of breath 03/20/2014   Hydronephrosis of right kidney 10/03/2013   Benign prostatic hyperplasia 09/24/2013   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   Dyslipidemia 04/06/2009   CARDIOVASCULAR FUNCTION STUDY, ABNORMAL 03/03/2009   Obstructive sleep apnea 02/04/2009   Essential hypertension 09/02/2008   G E R D 09/02/2008   PERIODIC LIMB  MOVEMENT DISORDER 09/03/2007    PCP: Sherrie Mustache NP  REFERRING PROVIDER: Wandra Feinstein MD  REFERRING DIAG: thoracic spine kyphosis; cervical lordosis; osteoporosis  THERAPY DIAG:  postural dysfunction; neck pain   Rationale for Evaluation and Treatment Rehabilitation  ONSET DATE: 05/07/21  SUBJECTIVE:                                                                                                                                                                                                         SUBJECTIVE STATEMENT: About 10% better.  Sore in the back of the neck.  Reports he tripped and  injured his lower leg, bled a lot since he's on Coumadin. PERTINENT HISTORY:  2 cracked vertebra in cervical spine a few years ago, no known injury Osteoporosis with 2 inch height loss  PAIN:  Are you having pain? Yes NPRS scale: 1-2/10 Pain location: lower cervical and between shoulder blades Aggravating factors: walking the dog, sitting at church; trouble getting up off the floor Relieving factors: take a Tylenol  PRECAUTIONS: Other: osteoporosis  WEIGHT BEARING RESTRICTIONS No  FALLS:  Has patient fallen in last 6 months? Yes. Number of falls tripped and did hit the floor  LIVING ENVIRONMENT: Lives with: lives with their spouse Lives in: House/apartment Stairs: Yes: External: 2 steps; on right going up   OCCUPATION:retired  PLOF: Independent with basic ADLs  PATIENT GOALS get my back straightened up  OBJECTIVE:    PATIENT SURVEYS:  12/06/2021:  FOTO 58%   COGNITION: Overall cognitive status: Within functional limits for tasks assessed    POSTURE: forward head and increased thoracic kyphosis Measurements standing against the wall:   At rest: ear lobe to wall 21 cm, with effect 18.5 cm ear lobe to wall Shoulders to wall 12 cm  8/30: ear lobe to wall 22 cm   CERVICAL ROM:   Active ROM A/PROM (deg) eval 8/23 8/30  Flexion 55    Extension 15 23   Right lateral flexion 13 20   Left lateral flexion 20 23   Right rotation 40 30 35  Left rotation 50 35 40   (Blank rows = not tested)  UPPER EXTREMITY ROM: bil UE WFLS  UPPER EXTREMITY MMT:  Eval:  grossly 4/5 UEs except middle and lower traps 4-/5  FUNCTIONAL TESTS:  12/12/2021: 5 times sit to stand: 19.3 sec with UE use required   TODAY'S TREATMENT:  8/30: Supine on wedge with 1 pillow  behind head: Shoulder press down 10x Cervical retractions 10x Red band overhead UE elevation, horizontal abduction, external rotation  10x each Bend press 5# on cane 10x Standing lat bar 25# 10x2 Seated with pink  ball behind head cervical retraction 10x; cervical rotation with ball  Seated with pool noodle vertically with SNAGS with towel 8x each way Seated with pool noodle vertically and red ball behind head, keeping head on ball green band rows 20x Seated thoracic extension with ball hands supporting head/neck 10x; Doorway pec stretch with staggered stance with UE slides 3x3 each side Manual therapy: soft tissue mobilization to bil upper traps, gentle cervical distraction  Neuromuscular re-ed: activation of cervical extensors and scapular retractors Therapeutic activity: standing and sitting with head in neutral with walking the dog, mowing; encouraged elevated book height when reading in bed or with sitting      8/23: Supine on wedge with 1 pillow  behind head: Shoulder press down 10x Cervical retractions 10x Red band overhead UE elevation, horizontal abduction, external rotation 10x each Bend press 5# on cane 10x  Standing lat bar 25# 10x2 Seated with pink ball behind head cervical retraction 10x; cervical rotation with ball  Seated with pool noodle vertically with SNAGS with towel 8x each way Seated thoracic extension with ball hands supporting head/neck 10x; Doorway pec stretch with staggered stance with UE slides 3x3 each side Manual therapy: soft tissue mobilization to bil upper traps, gentle cervical distraction  Neuromuscular re-ed: activation of cervical extensors and scapular retractors Therapeutic activity: standing and sitting with head in neutral with walking the dog, mowing; encouraged elevated book height when reading in bed or with sitting       PATIENT EDUCATION:  Education details: initial HEP with green band Person educated: Patient Education method: Explanation, Demonstration, and Handouts Education comprehension: verbalized understanding and returned demonstration   HOME EXERCISE PROGRAM: Access Code: [facility] URL: [email or web address] Date:  12/06/2021 Prepared by: Ruben Im  Exercises - Standing Bilateral Low Shoulder Row with Anchored Resistance  - 1 x daily - 7 x weekly - 2 sets - 10 reps - Shoulder Extension with Resistance Hands Down  - 1 x daily - 7 x weekly - 2 sets - 10 reps - Seated Shoulder Horizontal Abduction with Resistance - Thumbs Up  - 1 x daily - 7 x weekly - 2 sets - 10 reps - Seated Thoracic Lumbar Extension with Pectoralis Stretch  - 1 x daily - 7 x weekly - 1 sets - 10 reps  ASSESSMENT:  CLINICAL IMPRESSION: The patient is able to continue with postural muscle strengthening.  Moderate cues for head erect positioning in sitting and standing.  Cued to pause with sit to stand transitions (seems a little off balance today).  No changes in cervical ROM or postural alignment yet but anticipate changes over a longer period of time considering how long these impairments have been present.    OBJECTIVE IMPAIRMENTS decreased ROM, decreased strength, impaired UE functional use, postural dysfunction, and pain.   ACTIVITY LIMITATIONS carrying, lifting, and reading, sitting at church  PARTICIPATION LIMITATIONS: driving, shopping, community activity, and church  PERSONAL FACTORS Age and Time since onset of injury/illness/exacerbation are also affecting patient's functional outcome.   REHAB POTENTIAL: Good  CLINICAL DECISION MAKING: Stable/uncomplicated  EVALUATION COMPLEXITY: Low   GOALS: Goals reviewed with patient? Yes  SHORT TERM GOALS: Target date: 01/03/2022   The patient will demonstrate knowledge of basic self care strategies and exercises to promote healing  (using lumbar roll, book prop) Baseline:  Goal status: goal met 8/30  2.  Improved postural alignment with ear lobe to  wall measurement decreased to 17 cm Baseline: 22 cm Goal status:  ongoing  3.  Improved rotation ROM to 55 degrees bil needed for driving Baseline:  Goal status: ongoing  4.  The patient will report lower cervical and  periscapular pain level decreased by 25% Baseline: on 8/30 10%  Goal status: ongoing   LONG TERM GOALS: Target date: 01/31/2022  The patient will be independent in a safe self progression of a home exercise program to promote further recovery of function   Baseline:  Goal status: INITIAL  2.  Cervical sidebending ROM improved to 30 degrees and extension to 25 degrees needed for driving Baseline:  Goal status: INITIAL  3.  The patient will have improved standing postural alignment with ear lobe to wall measurement 15cm  Baseline:  Goal status: INITIAL  4.  The patient will have improved FOTO score to    63%   indicating improved function with less pain  Baseline:  Goal status: INITIAL  5.  Lower neck pain and interscapular pain decreased by 50% Baseline:  Goal status: INITIAL     PLAN: PT FREQUENCY: 1x/week  PT DURATION: 8 weeks  PLANNED INTERVENTIONS: Therapeutic exercises, Therapeutic activity, Neuromuscular re-education, Patient/Family education, Self Care, Joint mobilization, Aquatic Therapy, Dry Needling, Spinal mobilization, Cryotherapy, Moist heat, Taping, Traction, Ultrasound, Manual therapy, and Re-evaluation  PLAN FOR NEXT SESSION:   Progress HEP;   band postural ex's and progress;  thoracic extension mobility; cervical ROM especially rotation    Ruben Im, PT 01/03/22 5:45 PM Phone: [phone] Fax: [phone]   [facility] [address], [address] [place], [place] [address] Phone # [phone] Fax [phone]

## 2022-01-05 DIAGNOSIS — G4733 Obstructive sleep apnea (adult) (pediatric): Secondary | ICD-10-CM | POA: Diagnosis not present

## 2022-01-11 ENCOUNTER — Encounter: Payer: Self-pay | Admitting: Internal Medicine

## 2022-01-11 ENCOUNTER — Other Ambulatory Visit: Payer: Self-pay

## 2022-01-11 DIAGNOSIS — I4891 Unspecified atrial fibrillation: Secondary | ICD-10-CM

## 2022-01-11 MED ORDER — WARFARIN SODIUM 2.5 MG PO TABS: ORAL_TABLET | ORAL | 0 refills | Status: DC

## 2022-01-11 NOTE — Telephone Encounter (Signed)
Prescription refill request received for warfarin Lov: 08/09/21 Stanford Breed)  Next INR check: 02/12/22 Warfarin tablet strength: 2.'5mg'$   Appropriate dose and refill sent to requested pharmacy.

## 2022-01-12 NOTE — Telephone Encounter (Signed)
"  Is there another med that I can take other than Fexofenadine HCL that l can take to relieve my stuffy nose and my mucus filled throat? I have been mouth breathing and my mouth is dry when I use my CPAP machine. Please help!"  Dr. Annamaria Boots please advise.

## 2022-01-12 NOTE — Telephone Encounter (Signed)
Use Sudafed decongestant in the morning- not later than lunch time. It is a stimulant and might make it harder to fall asleep if taken later. Use otc nasal saline gel in nostrils to make nose feel less dry. Turn up the setting on CPAP humidifier a couple of steps to increase moisture in the air cominng into mask.

## 2022-01-17 ENCOUNTER — Other Ambulatory Visit: Payer: Self-pay

## 2022-01-17 DIAGNOSIS — I4891 Unspecified atrial fibrillation: Secondary | ICD-10-CM

## 2022-01-17 MED ORDER — WARFARIN SODIUM 2.5 MG PO TABS: ORAL_TABLET | ORAL | 1 refills | Status: DC

## 2022-01-17 NOTE — Telephone Encounter (Signed)
Prescription refill request received for warfarin Lov: 08/09/21 Brent Griffith)  Next INR check: 02/12/22 Warfarin tablet strength: 2.'5mg'$   Appropriate dose and refill sent to requested pharmacy.

## 2022-01-18 ENCOUNTER — Ambulatory Visit: Payer: Medicare Other | Attending: Sports Medicine | Admitting: Physical Therapy

## 2022-01-18 DIAGNOSIS — R293 Abnormal posture: Secondary | ICD-10-CM

## 2022-01-18 DIAGNOSIS — M4123 Other idiopathic scoliosis, cervicothoracic region: Secondary | ICD-10-CM

## 2022-01-18 DIAGNOSIS — M6281 Muscle weakness (generalized): Secondary | ICD-10-CM

## 2022-01-18 DIAGNOSIS — M542 Cervicalgia: Secondary | ICD-10-CM | POA: Diagnosis not present

## 2022-01-18 NOTE — Therapy (Signed)
OUTPATIENT PHYSICAL THERAPY TREATMENT NOTE   Patient Name: Brent Griffith MRN: [id number] DOB:10-03-1935, 86 y.o., male Today's Date: 01/18/2022   PT End of Session - 01/18/22 1235     Visit Number 6    Date for PT Re-Evaluation 01/31/22    Authorization Type [employer]HC Medicare    PT Start Time 0093    PT Stop Time 1314    PT Time Calculation (min) 39 min    Activity Tolerance Patient tolerated treatment well             Past Medical History:  Diagnosis Date   Acute gastric ulcer without mention of hemorrhage, perforation, or obstruction    Allergic rhinitis, cause unspecified    Anemia, unspecified    Arthritis    Asthma    Blood transfusion without reported diagnosis    CAD (coronary artery disease)    Diaphragmatic hernia without mention of obstruction or gangrene    Elevated prostate specific antigen (PSA)    Enlarged prostate    Esophageal reflux    Extrinsic asthma, unspecified    Herpes zoster without mention of complication    Hyperlipidemia    Hypersomnia with sleep apnea, unspecified    Impotence of organic origin    Kyphosis    Lumbago    Obstructive sleep apnea (adult) (pediatric)    Pneumonia    hx of years ago    Senile osteoporosis    Trigger finger (acquired)    Tubular adenoma of colon 05/2013   Unspecified essential hypertension    Unspecified sleep apnea    CPAP- settings 4-12    Unspecified vitamin D deficiency    Past Surgical History:  Procedure Laterality Date   CARDIAC CATHETERIZATION  03/04/2009   Dr [name]   COLONOSCOPY     CORONARY ARTERY BYPASS GRAFT  2010   CYSTOSCOPY WITH RETROGRADE PYELOGRAM, URETEROSCOPY AND STENT PLACEMENT Right 10/04/2013   Procedure: CYSTOSCOPY WITH RETROGRADE PYELOGRAM,  AND STENT PLACEMENT;  Surgeon: Festus Aloe, MD;  Location: [facility];  Service: Urology;  Laterality: Right;   DENTAL SURGERY N/A    MOLE REMOVAL  1958   chin   ROTATOR CUFF REPAIR Left 04/1998   with bone spur removed, Dr French Ana    TONSILLECTOMY  1945   TRANSURETHRAL RESECTION OF PROSTATE N/A 09/24/2013   Procedure: TRANSURETHRAL RESECTION OF THE PROSTATE (TURP) WITH GYRUS (STAGED RIGHT LATERAL AND MEDIAN LOBE);  Surgeon: Ailene Rud, MD;  Location: [facility];  Service: Urology;  Laterality: N/A;   UPPER GI ENDOSCOPY     Patient Active Problem List   Diagnosis Date Noted   Encounter for therapeutic drug monitoring 10/25/2021   Atrial fibrillation ([place]) 10/25/2021   PVC's (premature ventricular contractions) 05/04/2021   COVID-19 virus infection 02/20/2021   Sinusitis 01/24/2021   New onset atrial fibrillation ([employer]) 01/19/2021   AKI (acute kidney injury) ([place]) 01/19/2021   HFrEF (heart failure with reduced ejection fraction) ([place]) 01/19/2021   CAD (coronary artery disease) 10/11/2017   IDA (iron deficiency anemia) 08/31/2016   Seasonal and perennial allergic rhinitis 08/28/2016   Palpitations 09/07/2015   Cough, persistent 07/20/2015   Senile osteoporosis 06/09/2014   Shortness of breath 03/20/2014   Hydronephrosis of right kidney 10/03/2013   Benign prostatic hyperplasia 09/24/2013   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   Dyslipidemia 04/06/2009   CARDIOVASCULAR FUNCTION STUDY, ABNORMAL 03/03/2009   Obstructive sleep apnea 02/04/2009   Essential hypertension 09/02/2008   G E R D 09/02/2008   PERIODIC LIMB  MOVEMENT DISORDER 09/03/2007    PCP: Sherrie Mustache NP  REFERRING PROVIDER: Wandra Feinstein MD  REFERRING DIAG: thoracic spine kyphosis; cervical lordosis; osteoporosis  THERAPY DIAG:  postural dysfunction; neck pain   Rationale for Evaluation and Treatment Rehabilitation  ONSET DATE: 05/07/21  SUBJECTIVE:                                                                                                                                                                                                         SUBJECTIVE STATEMENT: I put a Salonpas on my neck and that cleared up. My friend noticed I was  standing up straighter.  Had some dizziness after last visit.   Tripped on the stairs going in the house.     PERTINENT HISTORY:  2 cracked vertebra in cervical spine a few years ago, no known injury Osteoporosis with 2 inch height loss  PAIN:  Are you having pain? Yes NPRS scale: 4/10 left upper trap area Pain location: lower cervical and between shoulder blades Aggravating factors: walking the dog, sitting at church; trouble getting up off the floor Relieving factors: take a Tylenol  PRECAUTIONS: Other: osteoporosis  WEIGHT BEARING RESTRICTIONS No  FALLS:  Has patient fallen in last 6 months? Yes. Number of falls tripped and did hit the floor  LIVING ENVIRONMENT: Lives with: lives with their spouse Lives in: House/apartment Stairs: Yes: External: 2 steps; on right going up   OCCUPATION:retired  PLOF: Independent with basic ADLs  PATIENT GOALS get my back straightened up  OBJECTIVE:    PATIENT SURVEYS:  12/06/2021:  FOTO 58%   COGNITION: Overall cognitive status: Within functional limits for tasks assessed    POSTURE: forward head and increased thoracic kyphosis Measurements standing against the wall:   At rest: ear lobe to wall 21 cm, with effect 18.5 cm ear lobe to wall Shoulders to wall 12 cm  8/30: ear lobe to wall 22 cm   CERVICAL ROM:   Active ROM A/PROM (deg) eval 8/23 8/30 9/14  Flexion 55     Extension _0 Right lateral flexion _1 Left lateral flexion _2 Right rotation 40 30 35   Left rotation 50 35 40    (Blank rows = not tested)  UPPER EXTREMITY ROM: bil UE WFLS  UPPER EXTREMITY MMT:  Eval:  grossly 4/5 UEs except middle and lower traps 4-/5  FUNCTIONAL TESTS:  12/12/2021: 5 times sit to stand: 19.3 sec with UE use required   TODAY'S TREATMENT:  9/14: Supine  on wedge with 1 folded pillow  behind head: Shoulder press down 8x Cervical retractions 8x Red band overhead UE elevation, horizontal abduction,  external rotation 8x each Cervical rotation  Sidelying open books 8x each way  Standing lat bar 25# 10x2 Seated with  ball behind mid back with red band horizontal abduction 8x, Ws 8x verbal cues for gaze forward Seated with ball thoracic region green band rows 15x  Seated with ball thoracic region green band lat pulls 15x Seated levator stretch holding to chair leg 3x 30 sec each side  Seated green ball roll outs forward 10x Manual therapy: soft tissue mobilization to bil upper traps, gentle cervical distraction  Neuromuscular re-ed: activation of cervical extensors and scapular retractors Therapeutic activity: standing and sitting with head in neutral with walking the dog, mowing; encouraged elevated book height when reading in bed or with sitting      8/30: Supine on wedge with 1 pillow  behind head: Shoulder press down 10x Cervical retractions 10x Red band overhead UE elevation, horizontal abduction, external rotation 10x each Bend press 5# on cane 10x Standing lat bar 25# 10x2 Seated with pink ball behind head cervical retraction 10x; cervical rotation with ball  Seated with pool noodle vertically with SNAGS with towel 8x each way Seated with pool noodle vertically and red ball behind head, keeping head on ball green band rows 20x Seated thoracic extension with ball hands supporting head/neck 10x; Doorway pec stretch with staggered stance with UE slides 3x3 each side Manual therapy: soft tissue mobilization to bil upper traps, gentle cervical distraction  Neuromuscular re-ed: activation of cervical extensors and scapular retractors Therapeutic activity: standing and sitting with head in neutral with walking the dog, mowing; encouraged elevated book height when reading in bed or with sitting   PATIENT EDUCATION:  Education details: initial HEP with green band Person educated: Patient Education method: Explanation, Demonstration, and Handouts Education comprehension:  verbalized understanding and returned demonstration   HOME EXERCISE PROGRAM: Access Code: [facility] URL: [email or web address] Date: 12/06/2021 Prepared by: Ruben Im  Exercises - Standing Bilateral Low Shoulder Row with Anchored Resistance  - 1 x daily - 7 x weekly - 2 sets - 10 reps - Shoulder Extension with Resistance Hands Down  - 1 x daily - 7 x weekly - 2 sets - 10 reps - Seated Shoulder Horizontal Abduction with Resistance - Thumbs Up  - 1 x daily - 7 x weekly - 2 sets - 10 reps - Seated Thoracic Lumbar Extension with Pectoralis Stretch  - 1 x daily - 7 x weekly - 1 sets - 10 reps  ASSESSMENT:  CLINICAL IMPRESSION: Verbal cues for upward gaze particularly with seated ex's.  Cervical discomfort most likely due to stretching of shortened musculature and strengthening of posterior musculature.  Therapist monitoring response  and modifying treatment accordingly.     OBJECTIVE IMPAIRMENTS decreased ROM, decreased strength, impaired UE functional use, postural dysfunction, and pain.   ACTIVITY LIMITATIONS carrying, lifting, and reading, sitting at church  PARTICIPATION LIMITATIONS: driving, shopping, community activity, and church  PERSONAL FACTORS Age and Time since onset of injury/illness/exacerbation are also affecting patient's functional outcome.   REHAB POTENTIAL: Good  CLINICAL DECISION MAKING: Stable/uncomplicated  EVALUATION COMPLEXITY: Low   GOALS: Goals reviewed with patient? Yes  SHORT TERM GOALS: Target date: 01/03/2022   The patient will demonstrate knowledge of basic self care strategies and exercises to promote healing  (using lumbar roll, book prop) Baseline:  Goal status: goal met 8/30  2.  Improved postural alignment with ear lobe to wall measurement decreased to 17 cm Baseline: 22 cm Goal status:  ongoing  3.  Improved rotation ROM to 55 degrees bil needed for driving Baseline:  Goal status: ongoing  4.  The patient will report  lower cervical and periscapular pain level decreased by 25% Baseline: on 8/30 10%  Goal status: ongoing   LONG TERM GOALS: Target date: 01/31/2022  The patient will be independent in a safe self progression of a home exercise program to promote further recovery of function   Baseline:  Goal status: INITIAL  2.  Cervical sidebending ROM improved to 30 degrees and extension to 25 degrees needed for driving Baseline:  Goal status: INITIAL  3.  The patient will have improved standing postural alignment with ear lobe to wall measurement 15cm  Baseline:  Goal status: INITIAL  4.  The patient will have improved FOTO score to    63%   indicating improved function with less pain  Baseline:  Goal status: INITIAL  5.  Lower neck pain and interscapular pain decreased by 50% Baseline:  Goal status: INITIAL     PLAN: PT FREQUENCY: 1x/week  PT DURATION: 8 weeks  PLANNED INTERVENTIONS: Therapeutic exercises, Therapeutic activity, Neuromuscular re-education, Patient/Family education, Self Care, Joint mobilization, Aquatic Therapy, Dry Needling, Spinal mobilization, Cryotherapy, Moist heat, Taping, Traction, Ultrasound, Manual therapy, and Re-evaluation  PLAN FOR NEXT SESSION:   Progress HEP;   band postural ex's and progress;  thoracic extension mobility; cervical ROM especially rotation   Ruben Im, PT 01/18/22 1:16 PM Phone: [phone] Fax: [phone]   [facility] [address], [place], [place] [address] Phone # [phone] Fax [phone]

## 2022-01-23 ENCOUNTER — Other Ambulatory Visit: Payer: Self-pay | Admitting: Cardiology

## 2022-01-23 ENCOUNTER — Ambulatory Visit: Payer: Medicare Other | Admitting: Physical Therapy

## 2022-01-23 DIAGNOSIS — M4123 Other idiopathic scoliosis, cervicothoracic region: Secondary | ICD-10-CM | POA: Diagnosis not present

## 2022-01-23 DIAGNOSIS — M6281 Muscle weakness (generalized): Secondary | ICD-10-CM

## 2022-01-23 DIAGNOSIS — M542 Cervicalgia: Secondary | ICD-10-CM | POA: Diagnosis not present

## 2022-01-23 DIAGNOSIS — R293 Abnormal posture: Secondary | ICD-10-CM

## 2022-01-23 NOTE — Therapy (Signed)
OUTPATIENT PHYSICAL THERAPY TREATMENT NOTE   Patient Name: Brent Griffith MRN: [id number] DOB:[birth year], 86 y.o., male Today's Date: 01/23/2022   PT End of Session - 01/23/22 1440     Visit Number 7    Date for PT Re-Evaluation 01/31/22    Authorization Type [employer] Medicare    PT Start Time 0867    PT Stop Time 1525    PT Time Calculation (min) 42 min    Activity Tolerance Patient tolerated treatment well             Past Medical History:  Diagnosis Date   Acute gastric ulcer without mention of hemorrhage, perforation, or obstruction    Allergic rhinitis, cause unspecified    Anemia, unspecified    Arthritis    Asthma    Blood transfusion without reported diagnosis    CAD (coronary artery disease)    Diaphragmatic hernia without mention of obstruction or gangrene    Elevated prostate specific antigen (PSA)    Enlarged prostate    Esophageal reflux    Extrinsic asthma, unspecified    Herpes zoster without mention of complication    Hyperlipidemia    Hypersomnia with sleep apnea, unspecified    Impotence of organic origin    Kyphosis    Lumbago    Obstructive sleep apnea (adult) (pediatric)    Pneumonia    hx of years ago    Senile osteoporosis    Trigger finger (acquired)    Tubular adenoma of colon 05/2013   Unspecified essential hypertension    Unspecified sleep apnea    CPAP- settings 4-12    Unspecified vitamin D deficiency    Past Surgical History:  Procedure Laterality Date   CARDIAC CATHETERIZATION  03/04/2009   Dr [name]   COLONOSCOPY     CORONARY ARTERY BYPASS GRAFT  2010   CYSTOSCOPY WITH RETROGRADE PYELOGRAM, URETEROSCOPY AND STENT PLACEMENT Right 10/04/2013   Procedure: CYSTOSCOPY WITH RETROGRADE PYELOGRAM,  AND STENT PLACEMENT;  Surgeon: Festus Aloe, MD;  Location: [facility];  Service: Urology;  Laterality: Right;   DENTAL SURGERY N/A    MOLE REMOVAL  1958   chin   ROTATOR CUFF REPAIR Left 04/1998   with bone spur removed, Dr French Ana    TONSILLECTOMY  1945   TRANSURETHRAL RESECTION OF PROSTATE N/A 09/24/2013   Procedure: TRANSURETHRAL RESECTION OF THE PROSTATE (TURP) WITH GYRUS (STAGED RIGHT LATERAL AND MEDIAN LOBE);  Surgeon: Ailene Rud, MD;  Location: [facility];  Service: Urology;  Laterality: N/A;   UPPER GI ENDOSCOPY     Patient Active Problem List   Diagnosis Date Noted   Encounter for therapeutic drug monitoring 10/25/2021   Atrial fibrillation ([place]) 10/25/2021   PVC's (premature ventricular contractions) 05/04/2021   COVID-19 virus infection 02/20/2021   Sinusitis 01/24/2021   New onset atrial fibrillation ([place]) 01/19/2021   AKI (acute kidney injury) ([place]) 01/19/2021   HFrEF (heart failure with reduced ejection fraction) ([place]) 01/19/2021   CAD (coronary artery disease) 10/11/2017   IDA (iron deficiency anemia) 08/31/2016   Seasonal and perennial allergic rhinitis 08/28/2016   Palpitations 09/07/2015   Cough, persistent 07/20/2015   Senile osteoporosis 06/09/2014   Shortness of breath 03/20/2014   Hydronephrosis of right kidney 10/03/2013   Benign prostatic hyperplasia 09/24/2013   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   Dyslipidemia 04/06/2009   CARDIOVASCULAR FUNCTION STUDY, ABNORMAL 03/03/2009   Obstructive sleep apnea 02/04/2009   Essential hypertension 09/02/2008   G E R D 09/02/2008   PERIODIC LIMB  MOVEMENT DISORDER 09/03/2007    PCP: Sherrie Mustache NP  REFERRING PROVIDER: Wandra Feinstein MD  REFERRING DIAG: thoracic spine kyphosis; cervical lordosis; osteoporosis  THERAPY DIAG:  postural dysfunction; neck pain   Rationale for Evaluation and Treatment Rehabilitation  ONSET DATE: 05/07/21  SUBJECTIVE:                                                                                                                                                                                                         SUBJECTIVE STATEMENT: Going to the bathroom and tripped on a ledge and hit my head. No bleeding or  loss of consciousness.    Some discomfort lower neck today.    PERTINENT HISTORY:  2 cracked vertebra in cervical spine a few years ago, no known injury Osteoporosis with 2 inch height loss  PAIN:  Are you having pain? Yes NPRS scale: <5/10 back of the neck Pain location: lower cervical and between shoulder blades Aggravating factors: walking the dog, sitting at church; trouble getting up off the floor Relieving factors: take a Tylenol  PRECAUTIONS: Other: osteoporosis  WEIGHT BEARING RESTRICTIONS No  FALLS:  Has patient fallen in last 6 months? Yes. Number of falls tripped and did hit the floor  LIVING ENVIRONMENT: Lives with: lives with their spouse Lives in: House/apartment Stairs: Yes: External: 2 steps; on right going up   OCCUPATION:retired  PLOF: Independent with basic ADLs  PATIENT GOALS get my back straightened up  OBJECTIVE:    PATIENT SURVEYS:  12/06/2021:  FOTO 58%   COGNITION: Overall cognitive status: Within functional limits for tasks assessed    POSTURE: forward head and increased thoracic kyphosis Measurements standing against the wall:   At rest: ear lobe to wall 21 cm, with effect 18.5 cm ear lobe to wall Shoulders to wall 12 cm  8/30: ear lobe to wall 22 cm   CERVICAL ROM:   Active ROM A/PROM (deg) eval 8/23 8/30 9/14  Flexion 55     Extension _0 Right lateral flexion _1 Left lateral flexion _2 Right rotation 40 30 35   Left rotation 50 35 40    (Blank rows = not tested)  UPPER EXTREMITY ROM: bil UE WFLS  UPPER EXTREMITY MMT:  Eval:  grossly 4/5 UEs except middle and lower traps 4-/5  FUNCTIONAL TESTS:  12/12/2021: 5 times sit to stand: 19.3 sec with UE use required   TODAY'S TREATMENT:  9/19: Supine on wedge with 1 folded pillow  behind head: [employer]  press 3# horizontal abduction with cervical rotation 8x 2 1# weights overhead press 10x Cervical retractions 8x Sidelying open books 8x each way with  yellow band  Seated with  ball behind mid back with yellow band horizontal abduction/ open books 8x Seated with ball thoracic region green band rows 10x  Seated with ball thoracic region green band lat pulls 12x Seated green band upright row 12x Seated levator stretch holding to chair leg 3x 30 sec each side  Seated green ball roll outs forward 10x 2# seated overhead press single arm 10x each Seated 2# bil overhead press 10x Manual therapy: seated soft tissue mobilization to bil upper traps, gentle cervical distraction  Neuromuscular re-ed: activation of cervical extensors and scapular retractors Therapeutic activity: standing and sitting with head in neutral with walking the dog, mowing; encouraged elevated book height when reading in bed or with sitting     9/14: Supine on wedge with 1 folded pillow  behind head: Shoulder press down 8x Cervical retractions 8x Red band overhead UE elevation, horizontal abduction, external rotation 8x each Cervical rotation  Sidelying open books 8x each way  Standing lat bar 25# 10x2 Seated with  ball behind mid back with red band horizontal abduction 8x, Ws 8x verbal cues for gaze forward Seated with ball thoracic region green band rows 15x  Seated with ball thoracic region green band lat pulls 15x Seated levator stretch holding to chair leg 3x 30 sec each side  Seated green ball roll outs forward 10x Manual therapy: soft tissue mobilization to bil upper traps, gentle cervical distraction  Neuromuscular re-ed: activation of cervical extensors and scapular retractors Therapeutic activity: standing and sitting with head in neutral with walking the dog, mowing; encouraged elevated book height when reading in bed or with sitting    PATIENT EDUCATION:  Education details: initial HEP with green band Person educated: Patient Education method: Consulting civil engineer, Media planner, and Handouts Education comprehension: verbalized understanding and returned  demonstration   HOME EXERCISE PROGRAM: Access Code: [facility] URL: [email or web address] Date: 12/06/2021 Prepared by: Ruben Im  Exercises - Standing Bilateral Low Shoulder Row with Anchored Resistance  - 1 x daily - 7 x weekly - 2 sets - 10 reps - Shoulder Extension with Resistance Hands Down  - 1 x daily - 7 x weekly - 2 sets - 10 reps - Seated Shoulder Horizontal Abduction with Resistance - Thumbs Up  - 1 x daily - 7 x weekly - 2 sets - 10 reps - Seated Thoracic Lumbar Extension with Pectoralis Stretch  - 1 x daily - 7 x weekly - 1 sets - 10 reps  ASSESSMENT:  CLINICAL IMPRESSION: Moderate verbal cues in sitting for head neutral/forward gaze.  Therapist progressing challenge level of ex's based on response.  Good pain relief with soft tissue mobilization.    OBJECTIVE IMPAIRMENTS decreased ROM, decreased strength, impaired UE functional use, postural dysfunction, and pain.   ACTIVITY LIMITATIONS carrying, lifting, and reading, sitting at church  PARTICIPATION LIMITATIONS: driving, shopping, community activity, and church  PERSONAL FACTORS Age and Time since onset of injury/illness/exacerbation are also affecting patient's functional outcome.   REHAB POTENTIAL: Good  CLINICAL DECISION MAKING: Stable/uncomplicated  EVALUATION COMPLEXITY: Low   GOALS: Goals reviewed with patient? Yes  SHORT TERM GOALS: Target date: 01/03/2022   The patient will demonstrate knowledge of basic self care strategies and exercises to promote healing  (using lumbar roll, book prop) Baseline:  Goal status: goal met 8/30  2.  Improved postural alignment with ear  lobe to wall measurement decreased to 17 cm Baseline: 22 cm Goal status:  ongoing  3.  Improved rotation ROM to 55 degrees bil needed for driving Baseline:  Goal status: ongoing  4.  The patient will report lower cervical and periscapular pain level decreased by 25% Baseline: on 8/30 10%  Goal status:  ongoing   LONG TERM GOALS: Target date: 01/31/2022  The patient will be independent in a safe self progression of a home exercise program to promote further recovery of function   Baseline:  Goal status: INITIAL  2.  Cervical sidebending ROM improved to 30 degrees and extension to 25 degrees needed for driving Baseline:  Goal status: INITIAL  3.  The patient will have improved standing postural alignment with ear lobe to wall measurement 15cm  Baseline:  Goal status: INITIAL  4.  The patient will have improved FOTO score to    63%   indicating improved function with less pain  Baseline:  Goal status: INITIAL  5.  Lower neck pain and interscapular pain decreased by 50% Baseline:  Goal status: INITIAL     PLAN: PT FREQUENCY: 1x/week  PT DURATION: 8 weeks  PLANNED INTERVENTIONS: Therapeutic exercises, Therapeutic activity, Neuromuscular re-education, Patient/Family education, Self Care, Joint mobilization, Aquatic Therapy, Dry Needling, Spinal mobilization, Cryotherapy, Moist heat, Taping, Traction, Ultrasound, Manual therapy, and Re-evaluation  PLAN FOR NEXT SESSION:    band postural ex's and progress;  thoracic extension mobility; cervical ROM especially rotation   Ruben Im, PT 01/23/22 5:33 PM Phone: [phone] Fax: [facility] [address], [place] [address] [place],  [address] Phone # [phone] Fax [phone]

## 2022-01-31 ENCOUNTER — Ambulatory Visit: Payer: Medicare Other | Admitting: Physical Therapy

## 2022-01-31 DIAGNOSIS — M4123 Other idiopathic scoliosis, cervicothoracic region: Secondary | ICD-10-CM

## 2022-01-31 DIAGNOSIS — M6281 Muscle weakness (generalized): Secondary | ICD-10-CM

## 2022-01-31 DIAGNOSIS — R293 Abnormal posture: Secondary | ICD-10-CM

## 2022-01-31 DIAGNOSIS — M542 Cervicalgia: Secondary | ICD-10-CM | POA: Diagnosis not present

## 2022-01-31 NOTE — Therapy (Signed)
OUTPATIENT PHYSICAL THERAPY TREATMENT NOTE/RECERTIFICATION   Patient Name: Brent Griffith MRN: [id number] DOB:November 20, 1935, 86 y.o., male Today's Date: 01/31/2022   PT End of Session - 01/31/22 1417     Visit Number 8    Date for PT Re-Evaluation 03/28/22    Authorization Type [employer]HC Medicare    PT Start Time 3790    PT Stop Time 1438    PT Time Calculation (min) 40 min    Activity Tolerance Patient tolerated treatment well             Past Medical History:  Diagnosis Date   Acute gastric ulcer without mention of hemorrhage, perforation, or obstruction    Allergic rhinitis, cause unspecified    Anemia, unspecified    Arthritis    Asthma    Blood transfusion without reported diagnosis    CAD (coronary artery disease)    Diaphragmatic hernia without mention of obstruction or gangrene    Elevated prostate specific antigen (PSA)    Enlarged prostate    Esophageal reflux    Extrinsic asthma, unspecified    Herpes zoster without mention of complication    Hyperlipidemia    Hypersomnia with sleep apnea, unspecified    Impotence of organic origin    Kyphosis    Lumbago    Obstructive sleep apnea (adult) (pediatric)    Pneumonia    hx of years ago    Senile osteoporosis    Trigger finger (acquired)    Tubular adenoma of colon 05/2013   Unspecified essential hypertension    Unspecified sleep apnea    CPAP- settings 4-12    Unspecified vitamin D deficiency    Past Surgical History:  Procedure Laterality Date   CARDIAC CATHETERIZATION  03/04/2009   Dr [name]   COLONOSCOPY     CORONARY ARTERY BYPASS GRAFT  2010   CYSTOSCOPY WITH RETROGRADE PYELOGRAM, URETEROSCOPY AND STENT PLACEMENT Right 10/04/2013   Procedure: CYSTOSCOPY WITH RETROGRADE PYELOGRAM,  AND STENT PLACEMENT;  Surgeon: Festus Aloe, MD;  Location: [facility];  Service: Urology;  Laterality: Right;   DENTAL SURGERY N/A    MOLE REMOVAL  1958   chin   ROTATOR CUFF REPAIR Left 04/1998   with bone spur  removed, Dr French Ana   TONSILLECTOMY  1945   TRANSURETHRAL RESECTION OF PROSTATE N/A 09/24/2013   Procedure: TRANSURETHRAL RESECTION OF THE PROSTATE (TURP) WITH GYRUS (STAGED RIGHT LATERAL AND MEDIAN LOBE);  Surgeon: Ailene Rud, MD;  Location: [facility];  Service: Urology;  Laterality: N/A;   UPPER GI ENDOSCOPY     Patient Active Problem List   Diagnosis Date Noted   Encounter for therapeutic drug monitoring 10/25/2021   Atrial fibrillation ([place]) 10/25/2021   PVC's (premature ventricular contractions) 05/04/2021   COVID-19 virus infection 02/20/2021   Sinusitis 01/24/2021   New onset atrial fibrillation ([place]) 01/19/2021   AKI (acute kidney injury) ([place]) 01/19/2021   HFrEF (heart failure with reduced ejection fraction) ([place]) 01/19/2021   CAD (coronary artery disease) 10/11/2017   IDA (iron deficiency anemia) 08/31/2016   Seasonal and perennial allergic rhinitis 08/28/2016   Palpitations 09/07/2015   Cough, persistent 07/20/2015   Senile osteoporosis 06/09/2014   Shortness of breath 03/20/2014   Hydronephrosis of right kidney 10/03/2013   Benign prostatic hyperplasia 09/24/2013   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   Dyslipidemia 04/06/2009   CARDIOVASCULAR FUNCTION STUDY, ABNORMAL 03/03/2009   Obstructive sleep apnea 02/04/2009   Essential hypertension 09/02/2008   G E R D 09/02/2008   PERIODIC LIMB  MOVEMENT DISORDER 09/03/2007    PCP: Sherrie Mustache NP  REFERRING PROVIDER: Wandra Feinstein MD  REFERRING DIAG: thoracic spine kyphosis; cervical lordosis; osteoporosis  THERAPY DIAG:  postural dysfunction; neck pain   Rationale for Evaluation and Treatment Rehabilitation  ONSET DATE: 05/07/21  SUBJECTIVE:                                                                                                                                                                                                         SUBJECTIVE STATEMENT: Soreness from clearing the yard yesterday.  Sitting in  church I don't have that pain.      PERTINENT HISTORY:  2 cracked vertebra in cervical spine a few years ago, no known injury Osteoporosis with 2 inch height loss  PAIN:  Are you having pain? Yes NPRS scale: <4/10 back of the neck Pain location: lower cervical and between shoulder blades Aggravating factors: walking the dog, sitting at church; trouble getting up off the floor Relieving factors: take a Tylenol  PRECAUTIONS: Other: osteoporosis  WEIGHT BEARING RESTRICTIONS No  FALLS:  Has patient fallen in last 6 months? Yes. Number of falls tripped and did hit the floor  LIVING ENVIRONMENT: Lives with: lives with their spouse Lives in: House/apartment Stairs: Yes: External: 2 steps; on right going up   OCCUPATION:retired  PLOF: Independent with basic ADLs  PATIENT GOALS get my back straightened up  OBJECTIVE:    PATIENT SURVEYS:  12/06/2021:  FOTO 58%  9/27: 69%    COGNITION: Overall cognitive status: Within functional limits for tasks assessed    POSTURE: forward head and increased thoracic kyphosis Measurements standing against the wall:   At rest: ear lobe to wall 21 cm, with effect 18.5 cm ear lobe to wall Shoulders to wall 12 cm  8/30: ear lobe to wall 22 cm  9/27: 17cm ear lobe   CERVICAL ROM:   Active ROM A/PROM (deg) eval 8/23 8/30 9/14 9/27  Flexion 55      Extension 15 23  25  38  Right lateral flexion 13 20  25 25    Left lateral flexion 20 23  25 25   Right rotation 40 30 35  40  Left rotation 50 35 40  45   (Blank rows = not tested)  UPPER EXTREMITY ROM: bil UE WFLS  UPPER EXTREMITY MMT:  Eval:  grossly 4/5 UEs except middle and lower traps 4-/5  FUNCTIONAL TESTS:  12/12/2021: 5 times sit to stand: 19.3 sec with UE use required 9/27:  20 sec no hands    TODAY'S  TREATMENT:  9/27: Supine on wedge with 1 folded pillow  behind head: Cane press 3# 2 1# weights overhead press 10x Cervical retractions 8x Sidelying open books 8x  each way with yellow band  Standing against the wall UE elevation 5x  Standing against wall snow angels 5x Standing against wall yellow band single arm open books 5x each side Standing against wall yellow band overheads 5x Seated with ball thoracic region green band rows 10x  Seated with ball thoracic region green band lat pulls 12x UBE forward and backward 1 min each way Manual therapy: seated soft tissue mobilization to bil upper traps, gentle cervical distraction  Neuromuscular re-ed: activation of cervical extensors and scapular retractors Therapeutic activity: standing and sitting with head in neutral with walking the dog, mowing; encouraged elevated book height when reading in bed or with sitting    9/19: Supine on wedge with 1 folded pillow  behind head: Cane press 3# horizontal abduction with cervical rotation 8x 2 1# weights overhead press 10x Cervical retractions 8x Sidelying open books 8x each way with yellow band  Seated with  ball behind mid back with yellow band horizontal abduction/ open books 8x Seated with ball thoracic region green band rows 10x  Seated with ball thoracic region green band lat pulls 12x Seated green band upright row 12x Seated levator stretch holding to chair leg 3x 30 sec each side  Seated green ball roll outs forward 10x 2# seated overhead press single arm 10x each Seated 2# bil overhead press 10x Manual therapy: seated soft tissue mobilization to bil upper traps, gentle cervical distraction  Neuromuscular re-ed: activation of cervical extensors and scapular retractors Therapeutic activity: standing and sitting with head in neutral with walking the dog, mowing; encouraged elevated book height when reading in bed or with sitting      PATIENT EDUCATION:  Education details: initial HEP with green band Person educated: Patient Education method: Consulting civil engineer, Media planner, and Handouts Education comprehension: verbalized understanding and  returned demonstration   HOME EXERCISE PROGRAM: Access Code: [facility] URL: [email or web address] Date: 12/06/2021 Prepared by: Ruben Im  Exercises - Standing Bilateral Low Shoulder Row with Anchored Resistance  - 1 x daily - 7 x weekly - 2 sets - 10 reps - Shoulder Extension with Resistance Hands Down  - 1 x daily - 7 x weekly - 2 sets - 10 reps - Seated Shoulder Horizontal Abduction with Resistance - Thumbs Up  - 1 x daily - 7 x weekly - 2 sets - 10 reps - Seated Thoracic Lumbar Extension with Pectoralis Stretch  - 1 x daily - 7 x weekly - 1 sets - 10 reps  ASSESSMENT:  CLINICAL IMPRESSION:  Good improvement in postural correction with decreased head to wall distance.  FOTO outcome score much improved.  Self rates progress at 30%.  Verbal cues for head neutral/forward gaze.  The patient would benefit from a continuation of skilled PT for a further progression of strengthening and functional mobility.  Will continue to update and promote independence in a HEP needed for a return to the highest functional level possible with ADLs.   Would benefit from increasing to 2x/week for improved continuity of care.     OBJECTIVE IMPAIRMENTS decreased ROM, decreased strength, impaired UE functional use, postural dysfunction, and pain.   ACTIVITY LIMITATIONS carrying, lifting, and reading, sitting at church  PARTICIPATION LIMITATIONS: driving, shopping, community activity, and church  PERSONAL FACTORS Age and Time since onset of injury/illness/exacerbation are also affecting patient's functional outcome.  REHAB POTENTIAL: Good  CLINICAL DECISION MAKING: Stable/uncomplicated  EVALUATION COMPLEXITY: Low   GOALS: Goals reviewed with patient? Yes  SHORT TERM GOALS: Target date: 01/03/2022   The patient will demonstrate knowledge of basic self care strategies and exercises to promote healing  (using lumbar roll, book prop) Baseline:  Goal status: goal met 8/30  2.   Improved postural alignment with ear lobe to wall measurement decreased to 17 cm Baseline: 22 cm Goal status:  ongoing  3.  Improved rotation ROM to 55 degrees bil needed for driving Baseline:  Goal status: ongoing  4.  The patient will report lower cervical and periscapular pain level decreased by 25% Baseline:   Goal status: goal met  9/27 30%    LONG TERM GOALS: Target date: 03/28/2022 The patient will be independent in a safe self progression of a home exercise program to promote further recovery of function   Baseline:  Goal status: ongoing  2.  Cervical sidebending ROM improved to 30 degrees and extension to 25 degrees needed for driving Baseline:  Goal status: ongoing   3.  The patient will have improved standing postural alignment with ear lobe to wall measurement 15cm  Baseline:  Goal status: ongoing   4.  The patient will have improved FOTO score to    63%   indicating improved function with less pain  Baseline:  Goal status: goal met  5.  Lower neck pain and interscapular pain decreased by 50% Baseline:  Goal status: ongoing     PLAN: PT FREQUENCY: 1-2x/week  PT DURATION: 8 weeks  PLANNED INTERVENTIONS: Therapeutic exercises, Therapeutic activity, Neuromuscular re-education, Patient/Family education, Self Care, Joint mobilization, Aquatic Therapy, Dry Needling, Spinal mobilization, Cryotherapy, Moist heat, Taping, Traction, Ultrasound, Manual therapy, and Re-evaluation  PLAN FOR NEXT SESSION:    band postural ex's and progress;  thoracic extension mobility; cervical ROM; manual therapy as needed   Ruben Im, PT 01/31/22 4:35 PM Phone: [phone] Fax: [facility] [address] [place] [address] [place], [place] [address] Phone # [phone] Fax [phone]

## 2022-02-06 NOTE — Progress Notes (Unsigned)
Cardiology Office Note:    Date:  02/07/2022   ID:  Brent Griffith, DOB [birth year], MRN [id number]  PCP:  [name], NP [place] Cardiologist: Kirk Ruths, MD   Reason for visit: 58-monthfollow-up  History of Present Illness:    JSHAD LEDVINAis a 86y.o. male with a hx of CABG, hypertension, hyperlipidemia, anemia, asthma, OSA on CPAP and atrial fibrillation in September 2022 in the setting of COVID-19.   Echocardiogram 01/19/2021 showed LVEF 40-45%, normal RV function, mildly elevated pulmonary artery systolic pressure, mild mitral valve regurgitation, and mild aortic valve regurgitation.   I saw him in November 2022.  Patient stated he felt not quite right.  EKG showed ventricular bigeminy.  Zio patch ordered and showed ventricular ectopy burden was 22% with isolated PVCs, ventricular bigeminy and trigeminy.  He then was referred to EP and saw Dr. CLars Mage  Patient was noted to have very frequent PVCs largely monomorphic.  Mexiletine was added to amiodarone therapy.  He was thought not a good candidate for catheter ablation because of his advanced age and lack of clear relationship between the PVC burden and left ventricular dysfunction.     Saw him last in January 2023.  He was feeling overall well and [facility]  He saw Dr. CStanford Breedin April 2023.  Losartan 25 mg daily was added for his heart failure (ACEI prev stopped secondary to soft pressures).  Today, he comes in with his wife.  He denies lightheadedness since starting losartan.  No syncope.  He does sometimes have mechanical falls.  He is working with PT to help his neck strength.  He is able to complete yard work without shortness of breath or chest pain.  He denies LE edema.  He states he takes Lasix 2- 3 times per week.  He denies palpitations.  He no longer notices PVCs on his pulse oximetry.  He states they have his Coumadin now regulated.       Past Medical  History:  Diagnosis Date   Acute gastric ulcer without mention of hemorrhage, perforation, or obstruction    Allergic rhinitis, cause unspecified    Anemia, unspecified    Arthritis    Asthma    Blood transfusion without reported diagnosis    CAD (coronary artery disease)    Diaphragmatic hernia without mention of obstruction or gangrene    Elevated prostate specific antigen (PSA)    Enlarged prostate    Esophageal reflux    Extrinsic asthma, unspecified    Herpes zoster without mention of complication    Hyperlipidemia    Hypersomnia with sleep apnea, unspecified    Impotence of organic origin    Kyphosis    Lumbago    Obstructive sleep apnea (adult) (pediatric)    Pneumonia    hx of years ago    Senile osteoporosis    Trigger finger (acquired)    Tubular adenoma of colon 05/2013   Unspecified essential hypertension    Unspecified sleep apnea    CPAP- settings 4-12    Unspecified vitamin D deficiency     Past Surgical History:  Procedure Laterality Date   CARDIAC CATHETERIZATION  03/04/2009   Dr HRoxan Hockey  COLONOSCOPY     CORONARY ARTERY BYPASS GRAFT  2010   CYSTOSCOPY WITH RETROGRADE PYELOGRAM, URETEROSCOPY AND STENT PLACEMENT Right 10/04/2013   Procedure: CYSTOSCOPY WITH RETROGRADE PYELOGRAM,  AND STENT PLACEMENT;  Surgeon: MFestus Aloe  MD;  Location: [facility];  Service: Urology;  Laterality: Right;   DENTAL SURGERY N/A    MOLE REMOVAL  1958   chin   ROTATOR CUFF REPAIR Left 04/1998   with bone spur removed, Dr French Ana   TONSILLECTOMY  1945   TRANSURETHRAL RESECTION OF PROSTATE N/A 09/24/2013   Procedure: TRANSURETHRAL RESECTION OF THE PROSTATE (TURP) WITH GYRUS (STAGED RIGHT LATERAL AND MEDIAN LOBE);  Surgeon: Ailene Rud, MD;  Location: [facility];  Service: Urology;  Laterality: N/A;   UPPER GI ENDOSCOPY      Current Medications: Current Meds  Medication Sig   acetaminophen (TYLENOL) 650 MG CR tablet Take 650 mg by mouth as needed for pain.    albuterol (VENTOLIN HFA) 108 (90 Base) MCG/ACT inhaler USE 1 TO 2 INHALATIONS BY  MOUTH EVERY 6 HOURS AS  NEEDED FOR WHEEZING OR  SHORTNESS OF BREATH   amiodarone (PACERONE) 200 MG tablet Take 200 mg by mouth daily.   benzonatate (TESSALON) 100 MG capsule Take 1 capsule (100 mg total) by mouth 3 (three) times daily as needed for cough.   Calcium Citrate-Vitamin D (EQ CALCIUM CITRATE+D3 PO) Take 1,200 mg by mouth daily.   carvedilol (COREG) 6.25 MG tablet Take 1 tablet (6.25 mg total) by mouth 2 (two) times daily.   Cholecalciferol (VITAMIN D-3) 5000 units TABS Take 10,000 Units by mouth daily.   Coenzyme Q10 (COQ10) 200 MG CAPS Take 200 mg by mouth daily.   doxazosin (CARDURA) 2 MG tablet TAKE 1 TABLET BY MOUTH  DAILY   empagliflozin (JARDIANCE) 10 MG TABS tablet Take 1 tablet (10 mg total) by mouth daily before breakfast.   fexofenadine (ALLEGRA) 180 MG tablet Take 180 mg by mouth daily as needed for allergies.   finasteride (PROSCAR) 5 MG tablet TAKE 1 TABLET BY MOUTH IN  THE MORNING   folic acid (FOLVITE) 1 MG tablet TAKE 1 TABLET BY MOUTH  DAILY   furosemide (LASIX) 20 MG tablet TAKE 1 TABLET BY MOUTH DAILY AS  NEEDED FOR LEG SWELLING   Iron, Ferrous Sulfate, 325 (65 Fe) MG TABS Take 325 mg by mouth 2 (two) times daily.   losartan (COZAAR) 25 MG tablet Take 1 tablet (25 mg total) by mouth daily.   mexiletine (MEXITIL) 150 MG capsule Take 1 capsule (150 mg total) by mouth 2 (two) times daily. Please keep upcoming appointment for future refills. Thank you.   OVER THE COUNTER MEDICATION Take 1-2 tablets by mouth at bedtime as needed (pain). Percogosic (tylenol with allergy medicine)   pantoprazole (PROTONIX) 40 MG tablet TAKE 1 TABLET BY MOUTH  DAILY   rosuvastatin (CRESTOR) 20 MG tablet TAKE 1 TABLET BY MOUTH ONCE DAILY FOR CHOLESTEROL   warfarin (COUMADIN) 2.5 MG tablet Take 1/2 a tablet daily except for 1 tablet on Monday, Wednesday and Friday as directed by coumadin clinic     Allergies:    Compazine [prochlorperazine edisylate]   Social History   Socioeconomic History   Marital status: Married    Spouse name: Not on file   Number of children: 0   Years of education: Not on file   Highest education level: Not on file  Occupational History   Occupation: Surveyor, quantity  Tobacco Use   Smoking status: Never    Passive exposure: Never   Smokeless tobacco: Never  Vaping Use   Vaping Use: Never used  Substance and Sexual Activity   Alcohol use: No   Drug use: No   Sexual  activity: Not Currently  Other Topics Concern   Not on file  Social History Narrative   Not on file   Social Determinants of Health   Financial Resource Strain: Not on file  Food Insecurity: Not on file  Transportation Needs: Not on file  Physical Activity: Not on file  Stress: Not on file  Social Connections: Not on file     Family History: The patient's family history includes Breast cancer in his mother; Cirrhosis in his mother; Heart disease in his father and paternal grandfather; Hypertension in his father; Kyphosis in his mother; Rheumatic fever in his mother; Stroke in his father and mother. There is no history of Colon cancer.  ROS:   Please see the history of present illness.     EKGs/Labs/Other Studies Reviewed:    Recent Labs: 06/28/2021: Magnesium 2.2; TSH 1.330 09/08/2021: ALT 35; BUN 25; Creat 1.35; Potassium 3.9; Sodium 139 12/08/2021: Hemoglobin 12.0; Platelets 139   Recent Lipid Panel Lab Results  Component Value Date/Time   CHOL 107 05/05/2021 02:14 PM   CHOL 111 07/04/2015 08:11 AM   TRIG 42 05/05/2021 02:14 PM   HDL 57 05/05/2021 02:14 PM   HDL 52 07/04/2015 08:11 AM   LDLCALC 38 05/05/2021 02:14 PM    Physical Exam:    VS:  BP (!) 110/52   Pulse (!) 49   Ht '5\' 2"'$  (1.575 m)   Wt 126 lb 3.2 oz (57.2 kg)   SpO2 97%   BMI 23.08 kg/m    No data found.       Wt Readings from Last 3 Encounters:  02/07/22 126 lb 3.2 oz (57.2 kg)  12/08/21 127 lb  (57.6 kg)  12/05/21 123 lb 2 oz (55.8 kg)     GEN:  frail appearing, hunched neck, no acute distress HEENT: Normal NECK: No JVD; No carotid bruits CARDIAC: RRR, no murmurs, rubs, gallops RESPIRATORY:  Clear to auscultation without rales, wheezing or rhonchi  ABDOMEN: Soft, non-tender, non-distended MUSCULOSKELETAL: No edema  SKIN: Warm and dry NEUROLOGIC:  Alert and oriented PSYCHIATRIC:  Normal affect     ASSESSMENT AND PLAN   Chronic systolic heart failure, euvolemic -Echocardiogram 01/19/2021 showed LVEF 40-45% (EF 50-55% in 2015) -Continue BB and ARB and Jardiance.  Stable Cr 1.3 & normal K on labs 09/2021. -Continue Lasix as needed.   Atrial fibrillation in setting of COVID-19 -no recurrence of a-fib on Zio patch 03/2021 -Continue Coumadin for stroke prevention.     PVCs, improved -largely monomorphic.  The PVCs appear to be originating from the apical inferoseptum per EP -treated with amiodarone, coreg and mexiletine; f/u with EP   Coronary artery disease status post CABG, stable - no angina -CABG 2010: left internal mammary artery to left anterior descending, sequential saphenous vein graft to ramus intermedius and obtuse marginal 2, saphenous vein graft to posterior descending -No angina.  Continue statin and beta-blocker therapy.  No aspirin given anticoagulation.   Hypertension, well controlled -Continue medications as above. -Goal BP is <130/80.  Recommend DASH diet (high in vegetables, fruits, low-fat dairy products, whole grains, poultry, fish, and nuts and low in sweets, sugar-sweetened beverages, and red meats), salt restriction and increase physical activity.  Refer to dietitian.   Hyperlipidemia -LDL 38 in December 2022.  Continue statin.   Disposition - Follow-up in 6 months Dr. Stanford Breed.   Medication Adjustments/Labs and Tests Ordered: Current medicines are reviewed at length with the patient today.  Concerns regarding medicines are outlined above.  No  orders of the defined types were placed in this encounter.  No orders of the defined types were placed in this encounter.   Patient Instructions  Medication Instructions:  Your physician recommends that you continue on your current medications as directed. Please refer to the Current Medication list given to you today.  *If you need a refill on your cardiac medications before your next appointment, please call your pharmacy*   Lab Work: NONE ordered at this time of appointment   If you have labs (blood work) drawn today and your tests are completely normal, you will receive your results only by: [place] (if you have MyChart) OR A paper copy in the mail If you have any lab test that is abnormal or we need to change your treatment, we will call you to review the results.   Testing/Procedures: NONE ordered at this time of appointment     Follow-Up: At [facility], you and your health needs are our priority.  As part of our continuing mission to provide you with exceptional heart care, we have created designated Provider Care Teams.  These Care Teams include your primary Cardiologist (physician) and Advanced Practice Providers (APPs -  Physician Assistants and Nurse Practitioners) who all work together to provide you with the care you need, when you need it.  We recommend signing up for the patient portal called "MyChart".  Sign up information is provided on this After Visit Summary.  MyChart is used to connect with patients for Virtual Visits (Telemedicine).  Patients are able to view lab/test results, encounter notes, upcoming appointments, etc.  Non-urgent messages can be sent to your provider as well.   To learn more about what you can do with MyChart, go to [email or web address].    Your next appointment:   6 month(s)  The format for your next appointment:   In Person  Provider:   Kirk Ruths, MD or Caron Presume, PA-C   Signed, Warren Lacy,  PA-C  02/07/2022 12:40 PM    [place]

## 2022-02-07 ENCOUNTER — Encounter: Payer: Self-pay | Admitting: Physician Assistant

## 2022-02-07 ENCOUNTER — Ambulatory Visit: Payer: Medicare Other | Attending: Physician Assistant | Admitting: Physician Assistant

## 2022-02-07 VITALS — BP 110/52 | HR 49 | Ht 62.0 in | Wt 126.2 lb

## 2022-02-07 DIAGNOSIS — I480 Paroxysmal atrial fibrillation: Secondary | ICD-10-CM

## 2022-02-07 DIAGNOSIS — I2510 Atherosclerotic heart disease of native coronary artery without angina pectoris: Secondary | ICD-10-CM | POA: Diagnosis not present

## 2022-02-07 DIAGNOSIS — I5022 Chronic systolic (congestive) heart failure: Secondary | ICD-10-CM | POA: Diagnosis not present

## 2022-02-07 NOTE — Patient Instructions (Signed)
Medication Instructions:  Your physician recommends that you continue on your current medications as directed. Please refer to the Current Medication list given to you today.  *If you need a refill on your cardiac medications before your next appointment, please call your pharmacy*   Lab Work: NONE ordered at this time of appointment   If you have labs (blood work) drawn today and your tests are completely normal, you will receive your results only by: [place] (if you have MyChart) OR A paper copy in the mail If you have any lab test that is abnormal or we need to change your treatment, we will call you to review the results.   Testing/Procedures: NONE ordered at this time of appointment     Follow-Up: At [facility], you and your health needs are our priority.  As part of our continuing mission to provide you with exceptional heart care, we have created designated Provider Care Teams.  These Care Teams include your primary Cardiologist (physician) and Advanced Practice Providers (APPs -  Physician Assistants and Nurse Practitioners) who all work together to provide you with the care you need, when you need it.  We recommend signing up for the patient portal called "MyChart".  Sign up information is provided on this After Visit Summary.  MyChart is used to connect with patients for Virtual Visits (Telemedicine).  Patients are able to view lab/test results, encounter notes, upcoming appointments, etc.  Non-urgent messages can be sent to your provider as well.   To learn more about what you can do with MyChart, go to [email or web address].    Your next appointment:   6 month(s)  The format for your next appointment:   In Person  Provider:   Kirk Ruths, MD or Caron Presume, PA-C

## 2022-02-08 ENCOUNTER — Ambulatory Visit: Payer: Medicare Other | Attending: Sports Medicine | Admitting: Physical Therapy

## 2022-02-08 DIAGNOSIS — M6281 Muscle weakness (generalized): Secondary | ICD-10-CM

## 2022-02-08 DIAGNOSIS — M542 Cervicalgia: Secondary | ICD-10-CM

## 2022-02-08 DIAGNOSIS — R293 Abnormal posture: Secondary | ICD-10-CM

## 2022-02-08 DIAGNOSIS — M4123 Other idiopathic scoliosis, cervicothoracic region: Secondary | ICD-10-CM

## 2022-02-08 NOTE — Therapy (Signed)
OUTPATIENT PHYSICAL THERAPY TREATMENT NOTE   Patient Name: Brent Griffith MRN: [id number] DOB:[birth year], 86 y.o., male Today's Date: 02/08/2022   PT End of Session - 02/08/22 1458     Visit Number 9    Date for PT Re-Evaluation 03/28/22    Authorization Type [employer] Medicare    PT Start Time 8938   late   PT Stop Time 1529    PT Time Calculation (min) 36 min    Activity Tolerance Patient tolerated treatment well             Past Medical History:  Diagnosis Date   Acute gastric ulcer without mention of hemorrhage, perforation, or obstruction    Allergic rhinitis, cause unspecified    Anemia, unspecified    Arthritis    Asthma    Blood transfusion without reported diagnosis    CAD (coronary artery disease)    Diaphragmatic hernia without mention of obstruction or gangrene    Elevated prostate specific antigen (PSA)    Enlarged prostate    Esophageal reflux    Extrinsic asthma, unspecified    Herpes zoster without mention of complication    Hyperlipidemia    Hypersomnia with sleep apnea, unspecified    Impotence of organic origin    Kyphosis    Lumbago    Obstructive sleep apnea (adult) (pediatric)    Pneumonia    hx of years ago    Senile osteoporosis    Trigger finger (acquired)    Tubular adenoma of colon 05/2013   Unspecified essential hypertension    Unspecified sleep apnea    CPAP- settings 4-12    Unspecified vitamin D deficiency    Past Surgical History:  Procedure Laterality Date   CARDIAC CATHETERIZATION  03/04/2009   Dr [name]   COLONOSCOPY     CORONARY ARTERY BYPASS GRAFT  2010   CYSTOSCOPY WITH RETROGRADE PYELOGRAM, URETEROSCOPY AND STENT PLACEMENT Right 10/04/2013   Procedure: CYSTOSCOPY WITH RETROGRADE PYELOGRAM,  AND STENT PLACEMENT;  Surgeon: Festus Aloe, MD;  Location: [facility];  Service: Urology;  Laterality: Right;   DENTAL SURGERY N/A    MOLE REMOVAL  1958   chin   ROTATOR CUFF REPAIR Left 04/1998   with bone spur removed, Dr  French Ana   TONSILLECTOMY  1945   TRANSURETHRAL RESECTION OF PROSTATE N/A 09/24/2013   Procedure: TRANSURETHRAL RESECTION OF THE PROSTATE (TURP) WITH GYRUS (STAGED RIGHT LATERAL AND MEDIAN LOBE);  Surgeon: Ailene Rud, MD;  Location: [facility];  Service: Urology;  Laterality: N/A;   UPPER GI ENDOSCOPY     Patient Active Problem List   Diagnosis Date Noted   Encounter for therapeutic drug monitoring 10/25/2021   Atrial fibrillation ([place]) 10/25/2021   PVC's (premature ventricular contractions) 05/04/2021   COVID-19 virus infection 02/20/2021   Sinusitis 01/24/2021   New onset atrial fibrillation ([place]) 01/19/2021   AKI (acute kidney injury) ([place]) 01/19/2021   HFrEF (heart failure with reduced ejection fraction) ([place]) 01/19/2021   CAD (coronary artery disease) 10/11/2017   IDA (iron deficiency anemia) 08/31/2016   Seasonal and perennial allergic rhinitis 08/28/2016   Palpitations 09/07/2015   Cough, persistent 07/20/2015   Senile osteoporosis 06/09/2014   Shortness of breath 03/20/2014   Hydronephrosis of right kidney 10/03/2013   Benign prostatic hyperplasia 09/24/2013   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   Dyslipidemia 04/06/2009   CARDIOVASCULAR FUNCTION STUDY, ABNORMAL 03/03/2009   Obstructive sleep apnea 02/04/2009   Essential hypertension 09/02/2008   G E R D 09/02/2008  PERIODIC LIMB MOVEMENT DISORDER 09/03/2007    PCP: Sherrie Mustache NP  REFERRING PROVIDER: Wandra Feinstein MD  REFERRING DIAG: thoracic spine kyphosis; cervical lordosis; osteoporosis  THERAPY DIAG:  postural dysfunction; neck pain   Rationale for Evaluation and Treatment Rehabilitation  ONSET DATE: 05/07/21  SUBJECTIVE:                                                                                                                                                                                                         SUBJECTIVE STATEMENT: I've about got the yard finished.  I should be OK.  Neck pain when I  hold my head up.  PERTINENT HISTORY:  2 cracked vertebra in cervical spine a few years ago, no known injury Osteoporosis with 2 inch height loss  PAIN:  Are you having pain? Yes NPRS scale: <4/10 back of the neck Pain location: lower cervical and between shoulder blades Aggravating factors: walking the dog, sitting at church; trouble getting up off the floor Relieving factors: take a Tylenol  PRECAUTIONS: Other: osteoporosis  WEIGHT BEARING RESTRICTIONS No  FALLS:  Has patient fallen in last 6 months? Yes. Number of falls tripped and did hit the floor  LIVING ENVIRONMENT: Lives with: lives with their spouse Lives in: House/apartment Stairs: Yes: External: 2 steps; on right going up   OCCUPATION:retired  PLOF: Independent with basic ADLs  PATIENT GOALS get my back straightened up  OBJECTIVE:    PATIENT SURVEYS:  12/06/2021:  FOTO 58%  9/27: 69%    COGNITION: Overall cognitive status: Within functional limits for tasks assessed    POSTURE: forward head and increased thoracic kyphosis Measurements standing against the wall:   At rest: ear lobe to wall 21 cm, with effect 18.5 cm ear lobe to wall Shoulders to wall 12 cm  8/30: ear lobe to wall 22 cm  9/27: 17cm ear lobe   CERVICAL ROM:   Active ROM A/PROM (deg) eval 8/23 8/30 9/14 9/27  Flexion 55      Extension _0 38  Right lateral flexion _1 Left lateral flexion _2 Right rotation 40 30 35  40  Left rotation 50 35 40  45   (Blank rows = not tested)  UPPER EXTREMITY ROM: bil UE WFLS  UPPER EXTREMITY MMT:  Eval:  grossly 4/5 UEs except middle and lower traps 4-/5  FUNCTIONAL TESTS:  12/12/2021: 5 times sit to stand: 19.3 sec with UE use required 9/27:  20 sec no hands  TODAY'S TREATMENT:  10/5: Supine on wedge with 1 folded pillow  behind head: Dumbell press 2 2# weights Standing against the wall UE elevation 5x  Sidelying 2# overhead press and horizontal  abduction 10x each Seated yellow band face pulls 10x At the counter number reaches with both hands Seated with foam roll vertically with green band lat pulls 2x10 Standing against wall snow angels 10x Standing at the wall open books 10x right/left  Manual therapy: seated soft tissue mobilization to bil upper traps, gentle cervical distraction; seated thoracic extension mobs with movement  grade 3 10x  Neuromuscular re-ed: activation of cervical extensors and scapular retractors Therapeutic activity: standing and sitting with head in neutral with walking the dog, mowing; encouraged elevated book height when reading in bed or with sitting       9/27: Supine on wedge with 1 folded pillow  behind head: Cane press 3# 2 1# weights overhead press 10x Cervical retractions 8x Sidelying open books 8x each way with yellow band  Standing against the wall UE elevation 5x  Standing against wall snow angels 5x Standing against wall yellow band single arm open books 5x each side Standing against wall yellow band overheads 5x Seated with ball thoracic region green band rows 10x  Seated with ball thoracic region green band lat pulls 12x UBE forward and backward 1 min each way Manual therapy: seated soft tissue mobilization to bil upper traps, gentle cervical distraction  Neuromuscular re-ed: activation of cervical extensors and scapular retractors Therapeutic activity: standing and sitting with head in neutral with walking the dog, mowing; encouraged elevated book height when reading in bed or with sitting    9/19: Supine on wedge with 1 folded pillow  behind head: Cane press 3# horizontal abduction with cervical rotation 8x 2 1# weights overhead press 10x Cervical retractions 8x Sidelying open books 8x each way with yellow band  Seated with  ball behind mid back with yellow band horizontal abduction/ open books 8x Seated with ball thoracic region green band rows 10x  Seated with ball  thoracic region green band lat pulls 12x Seated green band upright row 12x Seated levator stretch holding to chair leg 3x 30 sec each side  Seated green ball roll outs forward 10x 2# seated overhead press single arm 10x each Seated 2# bil overhead press 10x Manual therapy: seated soft tissue mobilization to bil upper traps, gentle cervical distraction  Neuromuscular re-ed: activation of cervical extensors and scapular retractors Therapeutic activity: standing and sitting with head in neutral with walking the dog, mowing; encouraged elevated book height when reading in bed or with sitting      PATIENT EDUCATION:  Education details: initial HEP with green band Person educated: Patient Education method: Consulting civil engineer, Media planner, and Handouts Education comprehension: verbalized understanding and returned demonstration   HOME EXERCISE PROGRAM: Access Code: [facility] URL: [email or web address] Date: 12/06/2021 Prepared by: Ruben Im  Exercises - Standing Bilateral Low Shoulder Row with Anchored Resistance  - 1 x daily - 7 x weekly - 2 sets - 10 reps - Shoulder Extension with Resistance Hands Down  - 1 x daily - 7 x weekly - 2 sets - 10 reps - Seated Shoulder Horizontal Abduction with Resistance - Thumbs Up  - 1 x daily - 7 x weekly - 2 sets - 10 reps - Seated Thoracic Lumbar Extension with Pectoralis Stretch  - 1 x daily - 7 x weekly - 1 sets - 10 reps  ASSESSMENT:  CLINICAL IMPRESSION: Verbal cues for  forward gaze/head neutral in all positions.  Good response to manual techniques including thoracic extension mobilization.  Low intensity neck pain in extensor muscles particularly with fatigue.      OBJECTIVE IMPAIRMENTS decreased ROM, decreased strength, impaired UE functional use, postural dysfunction, and pain.   ACTIVITY LIMITATIONS carrying, lifting, and reading, sitting at church  PARTICIPATION LIMITATIONS: driving, shopping, community activity, and  church  PERSONAL FACTORS Age and Time since onset of injury/illness/exacerbation are also affecting patient's functional outcome.   REHAB POTENTIAL: Good  CLINICAL DECISION MAKING: Stable/uncomplicated  EVALUATION COMPLEXITY: Low   GOALS: Goals reviewed with patient? Yes  SHORT TERM GOALS: Target date: 01/03/2022   The patient will demonstrate knowledge of basic self care strategies and exercises to promote healing  (using lumbar roll, book prop) Baseline:  Goal status: goal met 8/30  2.  Improved postural alignment with ear lobe to wall measurement decreased to 17 cm Baseline: 22 cm Goal status:  ongoing  3.  Improved rotation ROM to 55 degrees bil needed for driving Baseline:  Goal status: ongoing  4.  The patient will report lower cervical and periscapular pain level decreased by 25% Baseline:   Goal status: goal met  9/27 30%    LONG TERM GOALS: Target date: 03/28/2022 The patient will be independent in a safe self progression of a home exercise program to promote further recovery of function   Baseline:  Goal status: ongoing  2.  Cervical sidebending ROM improved to 30 degrees and extension to 25 degrees needed for driving Baseline:  Goal status: ongoing   3.  The patient will have improved standing postural alignment with ear lobe to wall measurement 15cm  Baseline:  Goal status: ongoing   4.  The patient will have improved FOTO score to    63%   indicating improved function with less pain  Baseline:  Goal status: goal met  5.  Lower neck pain and interscapular pain decreased by 50% Baseline:  Goal status: ongoing     PLAN: PT FREQUENCY: 1-2x/week  PT DURATION: 8 weeks  PLANNED INTERVENTIONS: Therapeutic exercises, Therapeutic activity, Neuromuscular re-education, Patient/Family education, Self Care, Joint mobilization, Aquatic Therapy, Dry Needling, Spinal mobilization, Cryotherapy, Moist heat, Taping, Traction, Ultrasound, Manual therapy, and  Re-evaluation  PLAN FOR NEXT SESSION:   10th visit progress note;  recheck ROM and FOTO; ear to wall measure; band postural ex's and progress;  thoracic extension mobility; cervical ROM; manual therapy as needed   Ruben Im, PT 02/08/22 2:59 PM Phone: [phone] Fax: [phone]  [facility] [address] [place], [place] [address] Phone # [phone] Fax [phone]

## 2022-02-12 ENCOUNTER — Ambulatory Visit: Payer: Medicare Other | Attending: Cardiology

## 2022-02-12 DIAGNOSIS — I4891 Unspecified atrial fibrillation: Secondary | ICD-10-CM

## 2022-02-12 DIAGNOSIS — Z5181 Encounter for therapeutic drug level monitoring: Secondary | ICD-10-CM

## 2022-02-12 LAB — POCT INR: INR: 2.7 (ref 2.0–3.0)

## 2022-02-12 NOTE — Patient Instructions (Signed)
USE WARFARIN 2.'5mg'$  tablets (Mint green color). Continue to take Warfarin 1/2 a tablet daily except for 1 tablet on Monday, Wednesday and Friday. Recheck INR in 6 weeks.  Pt drink's boost daily. [facility] [phone].

## 2022-02-13 MED ORDER — EMPAGLIFLOZIN 10 MG PO TABS: 10.0000 mg | ORAL_TABLET | Freq: Every day | ORAL | 3 refills | Status: DC

## 2022-02-14 ENCOUNTER — Ambulatory Visit: Payer: Medicare Other | Admitting: Physical Therapy

## 2022-02-14 DIAGNOSIS — M4123 Other idiopathic scoliosis, cervicothoracic region: Secondary | ICD-10-CM

## 2022-02-14 DIAGNOSIS — M6281 Muscle weakness (generalized): Secondary | ICD-10-CM | POA: Diagnosis not present

## 2022-02-14 DIAGNOSIS — R293 Abnormal posture: Secondary | ICD-10-CM | POA: Diagnosis not present

## 2022-02-14 DIAGNOSIS — M542 Cervicalgia: Secondary | ICD-10-CM

## 2022-02-14 NOTE — Therapy (Signed)
OUTPATIENT PHYSICAL THERAPY TREATMENT NOTE     Patient Name: Brent Griffith MRN: [id number] DOB:1935/07/26, 86 y.o., male Today's Date: 02/14/2022     Progress Note Reporting Period 12/06/21 to 02/14/22  See note below for Objective Data and Assessment of Progress/Goals.      PT End of Session - 02/14/22 1452     Visit Number 10    Date for PT Re-Evaluation 03/28/22    Authorization Type [employer]HC Medicare    PT Start Time 1447    PT Stop Time 1527    PT Time Calculation (min) 40 min    Activity Tolerance Patient tolerated treatment well             Past Medical History:  Diagnosis Date   Acute gastric ulcer without mention of hemorrhage, perforation, or obstruction    Allergic rhinitis, cause unspecified    Anemia, unspecified    Arthritis    Asthma    Blood transfusion without reported diagnosis    CAD (coronary artery disease)    Diaphragmatic hernia without mention of obstruction or gangrene    Elevated prostate specific antigen (PSA)    Enlarged prostate    Esophageal reflux    Extrinsic asthma, unspecified    Herpes zoster without mention of complication    Hyperlipidemia    Hypersomnia with sleep apnea, unspecified    Impotence of organic origin    Kyphosis    Lumbago    Obstructive sleep apnea (adult) (pediatric)    Pneumonia    hx of years ago    Senile osteoporosis    Trigger finger (acquired)    Tubular adenoma of colon 05/2013   Unspecified essential hypertension    Unspecified sleep apnea    CPAP- settings 4-12    Unspecified vitamin D deficiency    Past Surgical History:  Procedure Laterality Date   CARDIAC CATHETERIZATION  03/04/2009   Dr [name]   COLONOSCOPY     CORONARY ARTERY BYPASS GRAFT  2010   CYSTOSCOPY WITH RETROGRADE PYELOGRAM, URETEROSCOPY AND STENT PLACEMENT Right 10/04/2013   Procedure: CYSTOSCOPY WITH RETROGRADE PYELOGRAM,  AND STENT PLACEMENT;  Surgeon: Festus Aloe, MD;  Location: [facility];  Service: Urology;   Laterality: Right;   DENTAL SURGERY N/A    MOLE REMOVAL  1958   chin   ROTATOR CUFF REPAIR Left 04/1998   with bone spur removed, Dr French Ana   TONSILLECTOMY  1945   TRANSURETHRAL RESECTION OF PROSTATE N/A 09/24/2013   Procedure: TRANSURETHRAL RESECTION OF THE PROSTATE (TURP) WITH GYRUS (STAGED RIGHT LATERAL AND MEDIAN LOBE);  Surgeon: Ailene Rud, MD;  Location: [facility];  Service: Urology;  Laterality: N/A;   UPPER GI ENDOSCOPY     Patient Active Problem List   Diagnosis Date Noted   Encounter for therapeutic drug monitoring 10/25/2021   Atrial fibrillation ([place]) 10/25/2021   PVC's (premature ventricular contractions) 05/04/2021   COVID-19 virus infection 02/20/2021   Sinusitis 01/24/2021   New onset atrial fibrillation ([place]) 01/19/2021   AKI (acute kidney injury) ([place]) 01/19/2021   HFrEF (heart failure with reduced ejection fraction) ([place]) 01/19/2021   CAD (coronary artery disease) 10/11/2017   IDA (iron deficiency anemia) 08/31/2016   Seasonal and perennial allergic rhinitis 08/28/2016   Palpitations 09/07/2015   Cough, persistent 07/20/2015   Senile osteoporosis 06/09/2014   Shortness of breath 03/20/2014   Hydronephrosis of right kidney 10/03/2013   Benign prostatic hyperplasia 09/24/2013   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   Dyslipidemia 04/06/2009  CARDIOVASCULAR FUNCTION STUDY, ABNORMAL 03/03/2009   Obstructive sleep apnea 02/04/2009   Essential hypertension 09/02/2008   G E R D 09/02/2008   PERIODIC LIMB MOVEMENT DISORDER 09/03/2007    PCP: Sherrie Mustache NP  REFERRING PROVIDER: Wandra Feinstein MD  REFERRING DIAG: thoracic spine kyphosis; cervical lordosis; osteoporosis  THERAPY DIAG:  postural dysfunction; neck pain   Rationale for Evaluation and Treatment Rehabilitation  ONSET DATE: 05/07/21  SUBJECTIVE:                                                                                                                                                                                                          SUBJECTIVE STATEMENT: I only got about 3 1/2 hours of sleep.  Stiffness, not really painful today.    PERTINENT HISTORY:  2 cracked vertebra in cervical spine a few years ago, no known injury Osteoporosis with 2 inch height loss  PAIN:  Are you having pain? no NPRS scale:  back of the neck Pain location: lower cervical and between shoulder blades Aggravating factors: walking the dog, sitting at church; trouble getting up off the floor Relieving factors: take a Tylenol  PRECAUTIONS: Other: osteoporosis  WEIGHT BEARING RESTRICTIONS No  FALLS:  Has patient fallen in last 6 months? Yes. Number of falls tripped and did hit the floor  LIVING ENVIRONMENT: Lives with: lives with their spouse Lives in: House/apartment Stairs: Yes: External: 2 steps; on right going up   OCCUPATION:retired  PLOF: Independent with basic ADLs  PATIENT GOALS get my back straightened up  OBJECTIVE:    PATIENT SURVEYS:  12/06/2021:  FOTO 58%  9/27: 69%    COGNITION: Overall cognitive status: Within functional limits for tasks assessed    POSTURE: forward head and increased thoracic kyphosis Measurements standing against the wall:   At rest: ear lobe to wall 21 cm, with effect 18.5 cm ear lobe to wall Shoulders to wall 12 cm  8/30: ear lobe to wall 22 cm  9/27: 17cm ear lobe  10/11: 20 cm ear lobe to wall   CERVICAL ROM:   Active ROM A/PROM (deg) eval 8/23 8/30 9/14 9/27 10/11  Flexion 55       Extension _0 38 25  Right lateral flexion _1 Left lateral flexion _2 Right rotation 40 30 35  40 32  Left rotation 50 35 40  45 43   (Blank rows = not tested)  UPPER EXTREMITY ROM: bil UE WFLS  UPPER  EXTREMITY MMT:  Eval:  grossly 4/5 UEs except middle and lower traps 4-/5  FUNCTIONAL TESTS:  12/12/2021: 5 times sit to stand: 19.3 sec with UE use required 9/27:  20 sec no hands    TODAY'S TREATMENT:   10/11: Supine on wedge with 1 folded pillow  behind head: 6.6# plyo ball: chops 10x each side; press toward ceiling 12; overhead to knee 10x Sidelying open books 10x right/left  Seated thoracic extension with ball x10 Standing against wall snow angels 10x Standing facing wall UE slides 10x Wall push ups 10x  Seated green band dead lifts 10x Seated with foam roll lat pulls green band 2x10 Seated blue ball roll out 10x Standing UBE 1 min each direction Manual therapy:  seated thoracic extension mobs with movement  grade 3 10x  Neuromuscular re-ed: activation of cervical extensors and scapular retractors Therapeutic activity: standing and sitting with head in neutral with walking the dog, mowing; encouraged elevated book height when reading in bed or with sitting      10/5: Supine on wedge with 1 folded pillow  behind head: Dumbell press 2 2# weights Standing against the wall UE elevation 5x  Sidelying 2# overhead press and horizontal abduction 10x each Seated yellow band face pulls 10x At the counter number reaches with both hands Seated with foam roll vertically with green band lat pulls 2x10 Standing against wall snow angels 10x Standing at the wall open books 10x right/left  Manual therapy: seated soft tissue mobilization to bil upper traps, gentle cervical distraction; seated thoracic extension mobs with movement  grade 3 10x  Neuromuscular re-ed: activation of cervical extensors and scapular retractors Therapeutic activity: standing and sitting with head in neutral with walking the dog, mowing; encouraged elevated book height when reading in bed or with sitting       9/27: Supine on wedge with 1 folded pillow  behind head: Cane press 3# 2 1# weights overhead press 10x Cervical retractions 8x Sidelying open books 8x each way with yellow band  Standing against the wall UE elevation 5x  Standing against wall snow angels 5x Standing against wall yellow band single arm  open books 5x each side Standing against wall yellow band overheads 5x Seated with ball thoracic region green band rows 10x  Seated with ball thoracic region green band lat pulls 12x UBE forward and backward 1 min each way Manual therapy: seated soft tissue mobilization to bil upper traps, gentle cervical distraction  Neuromuscular re-ed: activation of cervical extensors and scapular retractors Therapeutic activity: standing and sitting with head in neutral with walking the dog, mowing; encouraged elevated book height when reading in bed or with sitting      PATIENT EDUCATION:  Education details: initial HEP with green band Person educated: Patient Education method: Consulting civil engineer, Media planner, and Handouts Education comprehension: verbalized understanding and returned demonstration   HOME EXERCISE PROGRAM: Access Code: [facility] URL: [email or web address] Date: 12/06/2021 Prepared by: Ruben Im  Exercises - Standing Bilateral Low Shoulder Row with Anchored Resistance  - 1 x daily - 7 x weekly - 2 sets - 10 reps - Shoulder Extension with Resistance Hands Down  - 1 x daily - 7 x weekly - 2 sets - 10 reps - Seated Shoulder Horizontal Abduction with Resistance - Thumbs Up  - 1 x daily - 7 x weekly - 2 sets - 10 reps - Seated Thoracic Lumbar Extension with Pectoralis Stretch  - 1 x daily - 7 x weekly - 1 sets - 10  reps  ASSESSMENT:  CLINICAL IMPRESSION: Although ROM and postural measurements vary, the overall trend is improving postural endurance.  The patient is able to maintain head neutral position for 1-2 minutes.  He would benefit from continued PT for a further progression of postural strengthening needed for improved balance, respiratory function, shoulder ROM and neck pain.  Progressing with rehab goals although slower progress expected secondary to longevity of this problem.       OBJECTIVE IMPAIRMENTS decreased ROM, decreased strength, impaired UE functional  use, postural dysfunction, and pain.   ACTIVITY LIMITATIONS carrying, lifting, and reading, sitting at church  PARTICIPATION LIMITATIONS: driving, shopping, community activity, and church  PERSONAL FACTORS Age and Time since onset of injury/illness/exacerbation are also affecting patient's functional outcome.   REHAB POTENTIAL: Good  CLINICAL DECISION MAKING: Stable/uncomplicated  EVALUATION COMPLEXITY: Low   GOALS: Goals reviewed with patient? Yes  SHORT TERM GOALS: Target date: 01/03/2022   The patient will demonstrate knowledge of basic self care strategies and exercises to promote healing  (using lumbar roll, book prop) Baseline:  Goal status: goal met 8/30  2.  Improved postural alignment with ear lobe to wall measurement decreased to 17 cm Baseline: 22 cm Goal status:  ongoing  3.  Improved rotation ROM to 55 degrees bil needed for driving Baseline:  Goal status: ongoing  4.  The patient will report lower cervical and periscapular pain level decreased by 25% Baseline:   Goal status: goal met  9/27 30%    LONG TERM GOALS: Target date: 03/28/2022 The patient will be independent in a safe self progression of a home exercise program to promote further recovery of function   Baseline:  Goal status: ongoing  2.  Cervical sidebending ROM improved to 30 degrees and extension to 25 degrees needed for driving Baseline:  Goal status: ongoing   3.  The patient will have improved standing postural alignment with ear lobe to wall measurement 15cm  Baseline:  Goal status: ongoing   4.  The patient will have improved FOTO score to    63%   indicating improved function with less pain  Baseline:  Goal status: goal met  5.  Lower neck pain and interscapular pain decreased by 50% Baseline:  Goal status: ongoing     PLAN: PT FREQUENCY: 1-2x/week  PT DURATION: 8 weeks  PLANNED INTERVENTIONS: Therapeutic exercises, Therapeutic activity, Neuromuscular re-education,  Patient/Family education, Self Care, Joint mobilization, Aquatic Therapy, Dry Needling, Spinal mobilization, Cryotherapy, Moist heat, Taping, Traction, Ultrasound, Manual therapy, and Re-evaluation  PLAN FOR NEXT SESSION:     postural ex's and progress;  thoracic extension mobility; cervical ROM; manual therapy as needed;  certs not signed yet  Ruben Im, PT 02/14/22 4:37 PM Phone: [phone] Fax: [phone]

## 2022-02-15 ENCOUNTER — Other Ambulatory Visit: Payer: Self-pay

## 2022-02-15 ENCOUNTER — Emergency Department (HOSPITAL_BASED_OUTPATIENT_CLINIC_OR_DEPARTMENT_OTHER): Payer: Medicare Other

## 2022-02-15 ENCOUNTER — Encounter (HOSPITAL_BASED_OUTPATIENT_CLINIC_OR_DEPARTMENT_OTHER): Payer: Self-pay | Admitting: Emergency Medicine

## 2022-02-15 ENCOUNTER — Emergency Department (HOSPITAL_BASED_OUTPATIENT_CLINIC_OR_DEPARTMENT_OTHER)
Admission: EM | Admit: 2022-02-15 | Discharge: 2022-02-15 | Disposition: A | Discharge status: Home / Self Care | Payer: Medicare Other | Attending: Emergency Medicine | Admitting: Emergency Medicine

## 2022-02-15 ENCOUNTER — Telehealth: Payer: Self-pay | Admitting: *Deleted

## 2022-02-15 DIAGNOSIS — I2510 Atherosclerotic heart disease of native coronary artery without angina pectoris: Secondary | ICD-10-CM | POA: Diagnosis not present

## 2022-02-15 DIAGNOSIS — S0990XA Unspecified injury of head, initial encounter: Secondary | ICD-10-CM | POA: Insufficient documentation

## 2022-02-15 DIAGNOSIS — W010XXA Fall on same level from slipping, tripping and stumbling without subsequent striking against object, initial encounter: Secondary | ICD-10-CM | POA: Insufficient documentation

## 2022-02-15 DIAGNOSIS — S199XXA Unspecified injury of neck, initial encounter: Secondary | ICD-10-CM | POA: Diagnosis not present

## 2022-02-15 DIAGNOSIS — Z79899 Other long term (current) drug therapy: Secondary | ICD-10-CM | POA: Insufficient documentation

## 2022-02-15 DIAGNOSIS — Z7901 Long term (current) use of anticoagulants: Secondary | ICD-10-CM | POA: Insufficient documentation

## 2022-02-15 DIAGNOSIS — Z951 Presence of aortocoronary bypass graft: Secondary | ICD-10-CM | POA: Insufficient documentation

## 2022-02-15 DIAGNOSIS — W19XXXA Unspecified fall, initial encounter: Secondary | ICD-10-CM

## 2022-02-15 DIAGNOSIS — I10 Essential (primary) hypertension: Secondary | ICD-10-CM | POA: Diagnosis not present

## 2022-02-15 NOTE — ED Provider Notes (Signed)
[facility] DEPARTMENT Provider Note   CSN: [id number] Arrival date & time: 02/15/22  1636     History  Chief Complaint  Patient presents with   Brent Griffith is a 86 y.o. male.  With past medical history of hypertension, GERD, obstructive sleep apnea, CAD s/p CABG, atrial fibrillation anticoagulated on Coumadin who presents to the emergency department for fall.  Patient states that he was out in the yard earlier today when he tripped and fell.  States that he was walking towards the shed to get the lawnmower.  States there was a blind in the yard and 1 foot went under the line and the other over.  States that he fell forward and struck the top of his head.  He denies loss of consciousness.  States that he fell too quickly to place his arms out in front of him and attempt to catch himself.  He denies any vomiting, blurred vision, confusion since the event. Denies chest pain, shortness of breath, lightheadedness/dizziness prior to or after the event. States he is anticoagulated on Coumadin. His INR was most recently checked on Monday and was 2.7.    Fall Associated symptoms include headaches.       Home Medications Prior to Admission medications   Medication Sig Start Date End Date Taking? Authorizing Provider  acetaminophen (TYLENOL) 650 MG CR tablet Take 650 mg by mouth as needed for pain.    [provider]  albuterol (VENTOLIN HFA) 108 (90 Base) MCG/ACT inhaler USE 1 TO 2 INHALATIONS BY  MOUTH EVERY 6 HOURS AS  NEEDED FOR WHEEZING OR  SHORTNESS OF BREATH 03/03/20   [name], NP  amiodarone (PACERONE) 200 MG tablet Take 200 mg by mouth daily.    [provider]  benzonatate (TESSALON) 100 MG capsule Take 1 capsule (100 mg total) by mouth 3 (three) times daily as needed for cough. 05/05/21   [name], NP  Calcium Citrate-Vitamin D (EQ CALCIUM CITRATE+D3 PO) Take 1,200 mg by mouth daily.    [provider]   carvedilol (COREG) 6.25 MG tablet Take 1 tablet (6.25 mg total) by mouth 2 (two) times daily. 06/14/21   Lelon Perla, MD  Cholecalciferol (VITAMIN D-3) 5000 units TABS Take 10,000 Units by mouth daily.    [provider]  Coenzyme Q10 (COQ10) 200 MG CAPS Take 200 mg by mouth daily.    [provider]  doxazosin (CARDURA) 2 MG tablet TAKE 1 TABLET BY MOUTH  DAILY 06/19/21   Deberah Pelton, NP  empagliflozin (JARDIANCE) 10 MG TABS tablet Take 1 tablet (10 mg total) by mouth daily before breakfast. 02/13/22   Lelon Perla, MD  fexofenadine (ALLEGRA) 180 MG tablet Take 180 mg by mouth daily as needed for allergies.    [provider]  finasteride (PROSCAR) 5 MG tablet TAKE 1 TABLET BY MOUTH IN  THE MORNING 10/06/21   [name], NP  folic acid (FOLVITE) 1 MG tablet TAKE 1 TABLET BY MOUTH  DAILY 11/13/21   [name], NP  furosemide (LASIX) 20 MG tablet TAKE 1 TABLET BY MOUTH DAILY AS  NEEDED FOR LEG SWELLING 08/15/21   Lelon Perla, MD  Iron, Ferrous Sulfate, 325 (65 Fe) MG TABS Take 325 mg by mouth 2 (two) times daily. 09/13/21   [name], NP  losartan (COZAAR) 25 MG tablet Take 1 tablet (25 mg total) by mouth daily. 08/09/21 05/07/48  Lelon Perla, MD  mexiletine (MEXITIL) 150 MG capsule Take 1 capsule (150 mg total) by mouth 2 (two) times daily. Please keep upcoming appointment for future refills. Thank you. 01/23/22   Vickie Epley, MD  OVER THE COUNTER MEDICATION Take 1-2 tablets by mouth at bedtime as needed (pain). Percogosic (tylenol with allergy medicine)    [provider]  pantoprazole (PROTONIX) 40 MG tablet TAKE 1 TABLET BY MOUTH  DAILY 08/09/21   [name], NP  rosuvastatin (CRESTOR) 20 MG tablet TAKE 1 TABLET BY MOUTH ONCE DAILY FOR CHOLESTEROL 11/13/21   [name], NP  warfarin (COUMADIN) 2.5 MG tablet Take 1/2 a tablet daily except for 1 tablet on Monday, Wednesday and Friday as directed by  coumadin clinic 01/17/22   Lelon Perla, MD      Allergies    Compazine [prochlorperazine edisylate]    Review of Systems   Review of Systems  Neurological:  Positive for headaches.  All other systems reviewed and are negative.   Physical Exam Updated Vital Signs BP (!) 150/62   Pulse 62   Temp 98 F (36.7 C) (Oral)   Resp 16   Ht '5\' 2"'$  (1.575 m)   Wt 55.8 kg   SpO2 98%   BMI 22.50 kg/m  Physical Exam Vitals and nursing note reviewed.  Constitutional:      General: He is not in acute distress.    Appearance: Normal appearance. He is not ill-appearing or toxic-appearing.  HENT:     Head: Normocephalic and atraumatic. No raccoon eyes, Battle's sign or laceration.     Comments: Mild TTP of the parietal scalp. No hematoma, abrasion, depression etc.     Nose: Nose normal.     Mouth/Throat:     Mouth: Mucous membranes are moist.     Pharynx: Oropharynx is clear.  Eyes:     General: No scleral icterus.    Extraocular Movements: Extraocular movements intact.     Pupils: Pupils are equal, round, and reactive to light.  Cardiovascular:     Rate and Rhythm: Normal rate. Rhythm irregular.     Pulses: Normal pulses.     Heart sounds: No murmur heard. Pulmonary:     Effort: Pulmonary effort is normal. No respiratory distress.  Abdominal:     Palpations: Abdomen is soft.     Tenderness: There is no abdominal tenderness.  Musculoskeletal:        General: No swelling, tenderness, deformity or signs of injury. Normal range of motion.     Cervical back: Normal range of motion and neck supple. No tenderness. No pain with movement, spinous process tenderness or muscular tenderness. Normal range of motion.  Skin:    General: Skin is warm and dry.     Capillary Refill: Capillary refill takes less than 2 seconds.     Findings: No bruising, erythema or rash.  Neurological:     General: No focal deficit present.     Mental Status: He is alert and oriented to person, place, and  time. Mental status is at baseline.     GCS: GCS eye subscore is 4. GCS verbal subscore is 5. GCS motor subscore is 6.     Cranial Nerves: Cranial nerves 2-12 are intact.     Sensory: No sensory deficit.     Motor: No weakness.  Psychiatric:        Mood and Affect: Mood normal.        Behavior: Behavior normal.  Thought Content: Thought content normal.        Judgment: Judgment normal.    ED Results / Procedures / Treatments   Labs (all labs ordered are listed, but only abnormal results are displayed) Labs Reviewed - No data to display  EKG None  Radiology CT Head Wo Contrast  Result Date: 02/15/2022 CLINICAL DATA:  Head trauma, moderate-severe; Neck trauma (Age >= 65y). fell today an hour ago , head injury , on warfarin for his A fib . EXAM: CT HEAD WITHOUT CONTRAST CT CERVICAL SPINE WITHOUT CONTRAST TECHNIQUE: Multidetector CT imaging of the head and cervical spine was performed following the standard protocol without intravenous contrast. Multiplanar CT image reconstructions of the cervical spine were also generated. RADIATION DOSE REDUCTION: This exam was performed according to the departmental dose-optimization program which includes automated exposure control, adjustment of the mA and/or kV according to patient size and/or use of iterative reconstruction technique. COMPARISON:  None Available. FINDINGS: CT HEAD FINDINGS BRAIN: BRAIN Cerebral ventricle sizes are concordant with the degree of cerebral volume loss. Patchy and confluent areas of decreased attenuation are noted throughout the deep and periventricular white matter of the cerebral hemispheres bilaterally, compatible with chronic microvascular ischemic disease. No evidence of large-territorial acute infarction. No parenchymal hemorrhage. No mass lesion. No extra-axial collection. No mass effect or midline shift. No hydrocephalus. Basilar cisterns are patent. Vascular: No hyperdense vessel. Skull: No acute fracture or  focal lesion. Sinuses/Orbits: Paranasal sinuses and mastoid air cells are clear. The orbits are unremarkable. Other: None. CT CERVICAL SPINE FINDINGS Alignment: Normal. Skull base and vertebrae: Multilevel degenerative changes spine with associated multilevel severe osseous neural foraminal stenosis. No severe osseous central canal stenosis. No acute fracture. No aggressive appearing focal osseous lesion or focal pathologic process. Soft tissues and spinal canal: No prevertebral fluid or swelling. No visible canal hematoma. Upper chest: Unremarkable. Other: Periapical lucency surrounding the remaining right maxillary tooth. IMPRESSION: 1. No acute intracranial abnormality. 2. No acute displaced fracture or traumatic listhesis of the cervical spine. 3. Periapical lucency surrounding the remaining right maxillary tooth. Electronically Signed   By: Iven Finn M.D.   On: 02/15/2022 17:47   CT Cervical Spine Wo Contrast  Result Date: 02/15/2022 CLINICAL DATA:  Head trauma, moderate-severe; Neck trauma (Age >= 65y). fell today an hour ago , head injury , on warfarin for his A fib . EXAM: CT HEAD WITHOUT CONTRAST CT CERVICAL SPINE WITHOUT CONTRAST TECHNIQUE: Multidetector CT imaging of the head and cervical spine was performed following the standard protocol without intravenous contrast. Multiplanar CT image reconstructions of the cervical spine were also generated. RADIATION DOSE REDUCTION: This exam was performed according to the departmental dose-optimization program which includes automated exposure control, adjustment of the mA and/or kV according to patient size and/or use of iterative reconstruction technique. COMPARISON:  None Available. FINDINGS: CT HEAD FINDINGS BRAIN: BRAIN Cerebral ventricle sizes are concordant with the degree of cerebral volume loss. Patchy and confluent areas of decreased attenuation are noted throughout the deep and periventricular white matter of the cerebral hemispheres  bilaterally, compatible with chronic microvascular ischemic disease. No evidence of large-territorial acute infarction. No parenchymal hemorrhage. No mass lesion. No extra-axial collection. No mass effect or midline shift. No hydrocephalus. Basilar cisterns are patent. Vascular: No hyperdense vessel. Skull: No acute fracture or focal lesion. Sinuses/Orbits: Paranasal sinuses and mastoid air cells are clear. The orbits are unremarkable. Other: None. CT CERVICAL SPINE FINDINGS Alignment: Normal. Skull base and vertebrae: Multilevel degenerative changes spine with  associated multilevel severe osseous neural foraminal stenosis. No severe osseous central canal stenosis. No acute fracture. No aggressive appearing focal osseous lesion or focal pathologic process. Soft tissues and spinal canal: No prevertebral fluid or swelling. No visible canal hematoma. Upper chest: Unremarkable. Other: Periapical lucency surrounding the remaining right maxillary tooth. IMPRESSION: 1. No acute intracranial abnormality. 2. No acute displaced fracture or traumatic listhesis of the cervical spine. 3. Periapical lucency surrounding the remaining right maxillary tooth. Electronically Signed   By: Iven Finn M.D.   On: 02/15/2022 17:47    Procedures Procedures    Medications Ordered in ED Medications - No data to display  ED Course/ Medical Decision Making/ A&P                           Medical Decision Making Amount and/or Complexity of Data Reviewed Radiology: ordered.  This patient presents to the ED with chief complaint(s) of fall with pertinent past medical history of atrial fibrillation, hypertension, CAD, GERD which further complicates the presenting complaint. The complaint involves an extensive differential diagnosis and also carries with it a high risk of complications and morbidity.    The differential diagnosis includes intracranial or spinal injury. MSK injury.    Additional history obtained: Additional  history obtained from spouse Records reviewed Care Everywhere/External Records and Primary Care Documents  ED Course and Reassessment: 86 year old male who presents to the emergency department for fall.  Well appearing, non-septic, non-toxic male. He is alert and oriented with no focal deficits.  States he struck parietal scalp on grass. There is no abrasion or contusion, hematoma on the head. No facial injuries. He has no c-spine TTP and has good ROM. No injuries or tenderness to the extremities, pelvis, chest or abdomen.  Anticoagulated on coumadin, so will scan head and neck.  He just had INR checked on Monday which was therapeutic at 2.7 so will not recheck. Given he had no prodromal type symptoms to concern for acute event prior to fall, will not check basic labs or troponin, EKG etc. Does not appear to be in unstable arrhythmia or afib rvr (given afib history) on monitor in the room.   Ambulated here without difficulty or assistance. I discussed this case with my attending physician who cosigned this note including patient's presenting symptoms, physical exam, and planned diagnostics and interventions. Attending physician stated agreement with plan or made changes to plan which were implemented.   Attending physician assessed patient at bedside.   Independent labs interpretation:  The following labs were independently interpreted: not indicated   Independent visualization of imaging: - I independently visualized the following imaging with scope of interpretation limited to determining acute life threatening conditions related to emergency care: CT head and C-spine, which revealed no acute abnormalities   Consultation: - Consulted or discussed management/test interpretation w/ external professional: not indicated   Consideration for admission or further workup: not indicated  Social Determinants of health: none identified  Final Clinical Impression(s) / ED Diagnoses Final  diagnoses:  Fall, initial encounter    Rx / DC Orders ED Discharge Orders     None         Mickie Hillier, PA-C 02/15/22 1803    Charlesetta Shanks, MD 02/16/22 1527

## 2022-02-15 NOTE — ED Triage Notes (Signed)
[facility] today an hour ago  , head injury , on warfarin for his A fib . Alert and oriented x 4

## 2022-02-15 NOTE — Telephone Encounter (Signed)
Pt left a voicemail stating he hit his head after tripping over a vine in the yard and hit his head on the cement and wanted to know to go get checked or wait things out. Returned a call to the pt and advised since he is on warfarin, an anticoagulant, that he should have it checked out. He verbalized understanding and states will go to the [facility] near him. He will follow up tomorrow.

## 2022-02-15 NOTE — Discharge Instructions (Addendum)
You were seen in the emergency department today for fall. The CAT scan of your head and neck do not show any acute injuries. Please follow-up with your primary care provider as needed. Please return for any change to your behavior or level of consciousness, confusion in the next 24 hours.

## 2022-02-15 NOTE — ED Notes (Signed)
Pt ambulatory with steady gait to restroom. No issues observed.

## 2022-02-18 ENCOUNTER — Other Ambulatory Visit: Payer: Self-pay | Admitting: Cardiology

## 2022-02-21 ENCOUNTER — Ambulatory Visit: Payer: Medicare Other | Admitting: Physical Therapy

## 2022-02-21 DIAGNOSIS — M6281 Muscle weakness (generalized): Secondary | ICD-10-CM

## 2022-02-21 DIAGNOSIS — M542 Cervicalgia: Secondary | ICD-10-CM

## 2022-02-21 DIAGNOSIS — R293 Abnormal posture: Secondary | ICD-10-CM | POA: Diagnosis not present

## 2022-02-21 DIAGNOSIS — M4123 Other idiopathic scoliosis, cervicothoracic region: Secondary | ICD-10-CM | POA: Diagnosis not present

## 2022-02-21 NOTE — Therapy (Signed)
OUTPATIENT PHYSICAL THERAPY TREATMENT NOTE     Patient Name: Brent Griffith MRN: [id number] DOB:[birth year], 86 y.o., male Today's Date: 02/21/2022     Progress Note Reporting Period 12/06/21 to 02/14/22  See note below for Objective Data and Assessment of Progress/Goals.      PT End of Session - 02/21/22 1456     Visit Number 11    Date for PT Re-Evaluation 03/28/22    Authorization Type [employer] Medicare    PT Start Time 9179    PT Stop Time 1535    PT Time Calculation (min) 42 min    Activity Tolerance Patient tolerated treatment well             Past Medical History:  Diagnosis Date   Acute gastric ulcer without mention of hemorrhage, perforation, or obstruction    Allergic rhinitis, cause unspecified    Anemia, unspecified    Arthritis    Asthma    Blood transfusion without reported diagnosis    CAD (coronary artery disease)    Diaphragmatic hernia without mention of obstruction or gangrene    Elevated prostate specific antigen (PSA)    Enlarged prostate    Esophageal reflux    Extrinsic asthma, unspecified    Herpes zoster without mention of complication    Hyperlipidemia    Hypersomnia with sleep apnea, unspecified    Impotence of organic origin    Kyphosis    Lumbago    Obstructive sleep apnea (adult) (pediatric)    Pneumonia    hx of years ago    Senile osteoporosis    Trigger finger (acquired)    Tubular adenoma of colon 05/2013   Unspecified essential hypertension    Unspecified sleep apnea    CPAP- settings 4-12    Unspecified vitamin D deficiency    Past Surgical History:  Procedure Laterality Date   CARDIAC CATHETERIZATION  03/04/2009   Dr [name]   COLONOSCOPY     CORONARY ARTERY BYPASS GRAFT  2010   CYSTOSCOPY WITH RETROGRADE PYELOGRAM, URETEROSCOPY AND STENT PLACEMENT Right 10/04/2013   Procedure: CYSTOSCOPY WITH RETROGRADE PYELOGRAM,  AND STENT PLACEMENT;  Surgeon: Festus Aloe, MD;  Location: [facility];  Service: Urology;   Laterality: Right;   DENTAL SURGERY N/A    MOLE REMOVAL  1958   chin   ROTATOR CUFF REPAIR Left 04/1998   with bone spur removed, Dr French Ana   TONSILLECTOMY  1945   TRANSURETHRAL RESECTION OF PROSTATE N/A 09/24/2013   Procedure: TRANSURETHRAL RESECTION OF THE PROSTATE (TURP) WITH GYRUS (STAGED RIGHT LATERAL AND MEDIAN LOBE);  Surgeon: Ailene Rud, MD;  Location: [facility];  Service: Urology;  Laterality: N/A;   UPPER GI ENDOSCOPY     Patient Active Problem List   Diagnosis Date Noted   Encounter for therapeutic drug monitoring 10/25/2021   Atrial fibrillation ([place]) 10/25/2021   PVC's (premature ventricular contractions) 05/04/2021   COVID-19 virus infection 02/20/2021   Sinusitis 01/24/2021   New onset atrial fibrillation ([place]) 01/19/2021   AKI (acute kidney injury) ([place]) 01/19/2021   HFrEF (heart failure with reduced ejection fraction) ([place]) 01/19/2021   CAD (coronary artery disease) 10/11/2017   IDA (iron deficiency anemia) 08/31/2016   Seasonal and perennial allergic rhinitis 08/28/2016   Palpitations 09/07/2015   Cough, persistent 07/20/2015   Senile osteoporosis 06/09/2014   Shortness of breath 03/20/2014   Hydronephrosis of right kidney 10/03/2013   Benign prostatic hyperplasia 09/24/2013   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   Dyslipidemia 04/06/2009  CARDIOVASCULAR FUNCTION STUDY, ABNORMAL 03/03/2009   Obstructive sleep apnea 02/04/2009   Essential hypertension 09/02/2008   G E R D 09/02/2008   PERIODIC LIMB MOVEMENT DISORDER 09/03/2007    PCP: Sherrie Mustache NP  REFERRING PROVIDER: Wandra Feinstein MD  REFERRING DIAG: thoracic spine kyphosis; cervical lordosis; osteoporosis  THERAPY DIAG:  postural dysfunction; neck pain   Rationale for Evaluation and Treatment Rehabilitation  ONSET DATE: 05/07/21  SUBJECTIVE:                                                                                                                                                                                                          SUBJECTIVE STATEMENT: Some neck pain today.  Mild soreness after last session.    PERTINENT HISTORY:  2 cracked vertebra in cervical spine a few years ago, no known injury Osteoporosis with 2 inch height loss  PAIN:  Are you having pain? yes NPRS scale:  back of the neck Pain location: lower cervical and between shoulder blades Aggravating factors: walking the dog, sitting at church; trouble getting up off the floor Relieving factors: take a Tylenol  PRECAUTIONS: Other: osteoporosis  WEIGHT BEARING RESTRICTIONS No  FALLS:  Has patient fallen in last 6 months? Yes. Number of falls tripped and did hit the floor  LIVING ENVIRONMENT: Lives with: lives with their spouse Lives in: House/apartment Stairs: Yes: External: 2 steps; on right going up   OCCUPATION:retired  PLOF: Independent with basic ADLs  PATIENT GOALS get my back straightened up  OBJECTIVE:    PATIENT SURVEYS:  12/06/2021:  FOTO 58%  9/27: 69%    COGNITION: Overall cognitive status: Within functional limits for tasks assessed    POSTURE: forward head and increased thoracic kyphosis Measurements standing against the wall:   At rest: ear lobe to wall 21 cm, with effect 18.5 cm ear lobe to wall Shoulders to wall 12 cm  8/30: ear lobe to wall 22 cm  9/27: 17cm ear lobe  10/11: 20 cm ear lobe to wall   CERVICAL ROM:   Active ROM A/PROM (deg) eval 8/23 8/30 9/14 9/27 10/11  Flexion 55       Extension _0 38 25  Right lateral flexion _1 Left lateral flexion _2 Right rotation 40 30 35  40 32  Left rotation 50 35 40  45 43   (Blank rows = not tested)  UPPER EXTREMITY ROM: bil UE WFLS  UPPER EXTREMITY MMT:  Eval:  grossly 4/5 UEs except middle and lower traps 4-/5  FUNCTIONAL TESTS:  12/12/2021: 5 times sit to stand: 19.3 sec with UE use required 9/27:  20 sec no hands    TODAY'S TREATMENT:  10/18: Supine on  wedge with 1 folded pillow  behind head: 5# dumbbell: chops 5x each side; press toward ceiling 5x2; overhead to knee 5x Seated thoracic extension with ball x10 seated  wall snow angels 10x Seated yellow band face pulls press overhead 5x Seated lat pulls blue band with handles (therapist holding) 2x10 cues for upward/forward gaze counter push ups 10x  Facing wall double arm slides up wall 5x, single arm with same side leg lift 5x on each side Back to wall with forward gaze with cervical rotation 5x Back to wall with head push into pink ball with progressively longer holds up to 10 sec seated UBE 6 min changing direction each minute Manual therapy:  seated thoracic extension mobs with movement  grade 3 10x  Neuromuscular re-ed: activation of cervical extensors and scapular retractors Therapeutic activity: standing and sitting with head in neutral with walking the dog, mowing; encouraged elevated book height when reading in bed or with sitting      10/11: Supine on wedge with 1 folded pillow  behind head: 6.6# plyo ball: chops 10x each side; press toward ceiling 12; overhead to knee 10x Sidelying open books 10x right/left  Seated thoracic extension with ball x10 Standing against wall snow angels 10x Standing facing wall UE slides 10x Wall push ups 10x  Seated green band dead lifts 10x Seated with foam roll lat pulls green band 2x10 Seated blue ball roll out 10x Standing UBE 1 min each direction Manual therapy:  seated thoracic extension mobs with movement  grade 3 10x  Neuromuscular re-ed: activation of cervical extensors and scapular retractors Therapeutic activity: standing and sitting with head in neutral with walking the dog, mowing; encouraged elevated book height when reading in bed or with sitting  PATIENT EDUCATION:  Education details: initial HEP with green band Person educated: Patient Education method: Consulting civil engineer, Media planner, and Handouts Education comprehension:  verbalized understanding and returned demonstration   HOME EXERCISE PROGRAM: Access Code: [facility] URL: [email or web address] Date: 12/06/2021 Prepared by: Ruben Im  Exercises - Standing Bilateral Low Shoulder Row with Anchored Resistance  - 1 x daily - 7 x weekly - 2 sets - 10 reps - Shoulder Extension with Resistance Hands Down  - 1 x daily - 7 x weekly - 2 sets - 10 reps - Seated Shoulder Horizontal Abduction with Resistance - Thumbs Up  - 1 x daily - 7 x weekly - 2 sets - 10 reps - Seated Thoracic Lumbar Extension with Pectoralis Stretch  - 1 x daily - 7 x weekly - 1 sets - 10 reps  ASSESSMENT:  CLINICAL IMPRESSION: Moderate verbal cues for head up/forward gaze with exercises.  Responds well with a visual target to maintain throughout the ex.   Mild discomfort in neck and upper back extensor muscles with fatigue.       OBJECTIVE IMPAIRMENTS decreased ROM, decreased strength, impaired UE functional use, postural dysfunction, and pain.   ACTIVITY LIMITATIONS carrying, lifting, and reading, sitting at church  PARTICIPATION LIMITATIONS: driving, shopping, community activity, and church  PERSONAL FACTORS Age and Time since onset of injury/illness/exacerbation are also affecting patient's functional outcome.   REHAB POTENTIAL: Good  CLINICAL DECISION MAKING: Stable/uncomplicated  EVALUATION COMPLEXITY: Low   GOALS: Goals reviewed with patient? Yes  SHORT TERM GOALS:  Target date: 01/03/2022   The patient will demonstrate knowledge of basic self care strategies and exercises to promote healing  (using lumbar roll, book prop) Baseline:  Goal status: goal met 8/30  2.  Improved postural alignment with ear lobe to wall measurement decreased to 17 cm Baseline: 22 cm Goal status:  ongoing  3.  Improved rotation ROM to 55 degrees bil needed for driving Baseline:  Goal status: ongoing  4.  The patient will report lower cervical and periscapular pain level  decreased by 25% Baseline:   Goal status: goal met  9/27 30%    LONG TERM GOALS: Target date: 03/28/2022 The patient will be independent in a safe self progression of a home exercise program to promote further recovery of function   Baseline:  Goal status: ongoing  2.  Cervical sidebending ROM improved to 30 degrees and extension to 25 degrees needed for driving Baseline:  Goal status: ongoing   3.  The patient will have improved standing postural alignment with ear lobe to wall measurement 15cm  Baseline:  Goal status: ongoing   4.  The patient will have improved FOTO score to    63%   indicating improved function with less pain  Baseline:  Goal status: goal met  5.  Lower neck pain and interscapular pain decreased by 50% Baseline:  Goal status: ongoing     PLAN: PT FREQUENCY: 1-2x/week  PT DURATION: 8 weeks  PLANNED INTERVENTIONS: Therapeutic exercises, Therapeutic activity, Neuromuscular re-education, Patient/Family education, Self Care, Joint mobilization, Aquatic Therapy, Dry Needling, Spinal mobilization, Cryotherapy, Moist heat, Taping, Traction, Ultrasound, Manual therapy, and Re-evaluation  PLAN FOR NEXT SESSION:   do Nu-Step; UBE;   postural ex's and progress;  thoracic extension mobility; cervical ROM; manual therapy as needed  Ruben Im, PT 02/21/22 3:40 PM Phone: [phone] Fax: [phone]

## 2022-03-05 ENCOUNTER — Encounter: Payer: Self-pay | Admitting: Physical Therapy

## 2022-03-05 ENCOUNTER — Telehealth: Payer: Self-pay

## 2022-03-05 ENCOUNTER — Ambulatory Visit: Payer: Medicare Other | Admitting: Physical Therapy

## 2022-03-05 DIAGNOSIS — M4123 Other idiopathic scoliosis, cervicothoracic region: Secondary | ICD-10-CM

## 2022-03-05 DIAGNOSIS — R293 Abnormal posture: Secondary | ICD-10-CM

## 2022-03-05 DIAGNOSIS — M6281 Muscle weakness (generalized): Secondary | ICD-10-CM | POA: Diagnosis not present

## 2022-03-05 DIAGNOSIS — M542 Cervicalgia: Secondary | ICD-10-CM

## 2022-03-05 NOTE — Therapy (Signed)
OUTPATIENT PHYSICAL THERAPY TREATMENT NOTE     Patient Name: Brent Griffith MRN: [id number] DOB:01-Nov-1935, 86 y.o., male Today's Date: 03/05/2022      PT End of Session - 03/05/22 1513     Visit Number 12    Date for PT Re-Evaluation 03/28/22    Authorization Type [employer] Medicare    PT Start Time 4098    PT Stop Time 1523    PT Time Calculation (min) 38 min    Activity Tolerance Patient tolerated treatment well    Behavior During Therapy WFL for tasks assessed/performed              Past Medical History:  Diagnosis Date   Acute gastric ulcer without mention of hemorrhage, perforation, or obstruction    Allergic rhinitis, cause unspecified    Anemia, unspecified    Arthritis    Asthma    Blood transfusion without reported diagnosis    CAD (coronary artery disease)    Diaphragmatic hernia without mention of obstruction or gangrene    Elevated prostate specific antigen (PSA)    Enlarged prostate    Esophageal reflux    Extrinsic asthma, unspecified    Herpes zoster without mention of complication    Hyperlipidemia    Hypersomnia with sleep apnea, unspecified    Impotence of organic origin    Kyphosis    Lumbago    Obstructive sleep apnea (adult) (pediatric)    Pneumonia    hx of years ago    Senile osteoporosis    Trigger finger (acquired)    Tubular adenoma of colon 05/2013   Unspecified essential hypertension    Unspecified sleep apnea    CPAP- settings 4-12    Unspecified vitamin D deficiency    Past Surgical History:  Procedure Laterality Date   CARDIAC CATHETERIZATION  03/04/2009   Dr [name]   COLONOSCOPY     CORONARY ARTERY BYPASS GRAFT  2010   CYSTOSCOPY WITH RETROGRADE PYELOGRAM, URETEROSCOPY AND STENT PLACEMENT Right 10/04/2013   Procedure: CYSTOSCOPY WITH RETROGRADE PYELOGRAM,  AND STENT PLACEMENT;  Surgeon: Festus Aloe, MD;  Location: [facility];  Service: Urology;  Laterality: Right;   DENTAL SURGERY N/A    MOLE REMOVAL  1958   chin    ROTATOR CUFF REPAIR Left 04/1998   with bone spur removed, Dr French Ana   TONSILLECTOMY  1945   TRANSURETHRAL RESECTION OF PROSTATE N/A 09/24/2013   Procedure: TRANSURETHRAL RESECTION OF THE PROSTATE (TURP) WITH GYRUS (STAGED RIGHT LATERAL AND MEDIAN LOBE);  Surgeon: Ailene Rud, MD;  Location: [facility];  Service: Urology;  Laterality: N/A;   UPPER GI ENDOSCOPY     Patient Active Problem List   Diagnosis Date Noted   Encounter for therapeutic drug monitoring 10/25/2021   Atrial fibrillation ([place]) 10/25/2021   PVC's (premature ventricular contractions) 05/04/2021   COVID-19 virus infection 02/20/2021   Sinusitis 01/24/2021   New onset atrial fibrillation ([place]) 01/19/2021   AKI (acute kidney injury) ([place]) 01/19/2021   HFrEF (heart failure with reduced ejection fraction) ([place]) 01/19/2021   CAD (coronary artery disease) 10/11/2017   IDA (iron deficiency anemia) 08/31/2016   Seasonal and perennial allergic rhinitis 08/28/2016   Palpitations 09/07/2015   Cough, persistent 07/20/2015   Senile osteoporosis 06/09/2014   Shortness of breath 03/20/2014   Hydronephrosis of right kidney 10/03/2013   Benign prostatic hyperplasia 09/24/2013   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   Dyslipidemia 04/06/2009   CARDIOVASCULAR FUNCTION STUDY, ABNORMAL 03/03/2009   Obstructive sleep apnea 02/04/2009  Essential hypertension 09/02/2008   G E R D 09/02/2008   PERIODIC LIMB MOVEMENT DISORDER 09/03/2007    PCP: Sherrie Mustache NP  REFERRING PROVIDER: Wandra Feinstein MD  REFERRING DIAG: thoracic spine kyphosis; cervical lordosis; osteoporosis  THERAPY DIAG:  postural dysfunction; neck pain   Rationale for Evaluation and Treatment Rehabilitation  ONSET DATE: 05/07/21  SUBJECTIVE:                                                                                                                                                                                                         SUBJECTIVE STATEMENT: I  have to force my head up. I didn't do much exercise because I had teeth pulled and I had a lot of pain.   PERTINENT HISTORY:  2 cracked vertebra in cervical spine a few years ago, no known injury Osteoporosis with 2 inch height loss  PAIN:  Are you having pain? yes NPRS scale:  back of the neck Pain location: lower cervical and between shoulder blades Aggravating factors: walking the dog, sitting at church; trouble getting up off the floor Relieving factors: take a Tylenol  PRECAUTIONS: Other: osteoporosis  WEIGHT BEARING RESTRICTIONS No  FALLS:  Has patient fallen in last 6 months? Yes. Number of falls tripped and did hit the floor  LIVING ENVIRONMENT: Lives with: lives with their spouse Lives in: House/apartment Stairs: Yes: External: 2 steps; on right going up   OCCUPATION:retired  PLOF: Independent with basic ADLs  PATIENT GOALS get my back straightened up  OBJECTIVE:    PATIENT SURVEYS:  12/06/2021:  FOTO 58%  9/27: 69%    COGNITION: Overall cognitive status: Within functional limits for tasks assessed    POSTURE: forward head and increased thoracic kyphosis Measurements standing against the wall:   At rest: ear lobe to wall 21 cm, with effect 18.5 cm ear lobe to wall Shoulders to wall 12 cm  8/30: ear lobe to wall 22 cm  9/27: 17cm ear lobe  10/11: 20 cm ear lobe to wall   CERVICAL ROM:   Active ROM A/PROM (deg) eval 8/23 8/30 9/14 9/27 10/11  Flexion 55       Extension _0 38 25  Right lateral flexion _1 Left lateral flexion _2 Right rotation 40 30 35  40 32  Left rotation 50 35 40  45 43   (Blank rows = not tested)  UPPER EXTREMITY ROM: bil UE WFLS  UPPER EXTREMITY MMT:  Eval:  grossly 4/5 UEs except middle and lower traps 4-/5  FUNCTIONAL TESTS:  12/12/2021: 5 times sit to stand: 19.3 sec with UE use required 9/27:  20 sec no hands    TODAY'S TREATMENT:   03/05/22: Supine on wedge with 1  folded pillow  behind head: 5# dumbbell: chops 10x each side; press toward ceiling 10x; overhead to knee 10x Seated thoracic extension with ball 2x10 seated  wall snow angels 10x Seated yellow band face pulls press overhead 5x Seated lat pulls blue band with handles (therapist holding) 2x10 cues for upward/forward gaze counter push ups 10x 2 Facing wall double arm slides up wall 10x, single arm with same side leg lift 10x on each side Back to wall with forward gaze with cervical rotation 10x VC to move neck and not just eyes Back to wall with head push into pink ball with progressively longer holds up to 10 sec seated UBE reverse with head up looking at the clock for 1 min 3x Nustep L2 old model 5 min to end sesison with VC to watch calendar 10/18: Supine on wedge with 1 folded pillow  behind head: 5# dumbbell: chops 5x each side; press toward ceiling 5x2; overhead to knee 5x Seated thoracic extension with ball x10 seated  wall snow angels 10x Seated yellow band face pulls press overhead 5x Seated lat pulls blue band with handles (therapist holding) 2x10 cues for upward/forward gaze counter push ups 10x  Facing wall double arm slides up wall 5x, single arm with same side leg lift 5x on each side Back to wall with forward gaze with cervical rotation 5x Back to wall with head push into pink ball with progressively longer holds up to 10 sec seated UBE 6 min changing direction each minute Manual therapy:  seated thoracic extension mobs with movement  grade 3 10x  Neuromuscular re-ed: activation of cervical extensors and scapular retractors Therapeutic activity: standing and sitting with head in neutral with walking the dog, mowing; encouraged elevated book height when reading in bed or with sitting      10/11: Supine on wedge with 1 folded pillow  behind head: 6.6# plyo ball: chops 10x each side; press toward ceiling 12; overhead to knee 10x Sidelying open books 10x right/left  Seated  thoracic extension with ball x10 Standing against wall snow angels 10x Standing facing wall UE slides 10x Wall push ups 10x  Seated green band dead lifts 10x Seated with foam roll lat pulls green band 2x10 Seated blue ball roll out 10x Standing UBE 1 min each direction Manual therapy:  seated thoracic extension mobs with movement  grade 3 10x  Neuromuscular re-ed: activation of cervical extensors and scapular retractors Therapeutic activity: standing and sitting with head in neutral with walking the dog, mowing; encouraged elevated book height when reading in bed or with sitting  PATIENT EDUCATION:  Education details: initial HEP with green band Person educated: Patient Education method: Consulting civil engineer, Media planner, and Handouts Education comprehension: verbalized understanding and returned demonstration   HOME EXERCISE PROGRAM: Access Code: [facility] URL: [email or web address] Date: 12/06/2021 Prepared by: Ruben Im  Exercises - Standing Bilateral Low Shoulder Row with Anchored Resistance  - 1 x daily - 7 x weekly - 2 sets - 10 reps - Shoulder Extension with Resistance Hands Down  - 1 x daily - 7 x weekly - 2 sets - 10 reps - Seated Shoulder Horizontal Abduction with Resistance - Thumbs Up  - 1 x daily - 7 x weekly - 2 sets -  10 reps - Seated Thoracic Lumbar Extension with Pectoralis Stretch  - 1 x daily - 7 x weekly - 1 sets - 10 reps  ASSESSMENT:  CLINICAL IMPRESSION: Moderate verbal cues for head up/forward gaze with exercises.  Responds well with a visual target to maintain throughout the ex.   Mild discomfort in neck and upper back extensor muscles with fatigue.  Pt had teeth pulled last week and was I a lot of pain so he did not do his exercises.      OBJECTIVE IMPAIRMENTS decreased ROM, decreased strength, impaired UE functional use, postural dysfunction, and pain.   ACTIVITY LIMITATIONS carrying, lifting, and reading, sitting at church  PARTICIPATION  LIMITATIONS: driving, shopping, community activity, and church  PERSONAL FACTORS Age and Time since onset of injury/illness/exacerbation are also affecting patient's functional outcome.   REHAB POTENTIAL: Good  CLINICAL DECISION MAKING: Stable/uncomplicated  EVALUATION COMPLEXITY: Low   GOALS: Goals reviewed with patient? Yes  SHORT TERM GOALS: Target date: 01/03/2022   The patient will demonstrate knowledge of basic self care strategies and exercises to promote healing  (using lumbar roll, book prop) Baseline:  Goal status: goal met 8/30  2.  Improved postural alignment with ear lobe to wall measurement decreased to 17 cm Baseline: 22 cm Goal status:  ongoing  3.  Improved rotation ROM to 55 degrees bil needed for driving Baseline:  Goal status: ongoing  4.  The patient will report lower cervical and periscapular pain level decreased by 25% Baseline:   Goal status: goal met  9/27 30%    LONG TERM GOALS: Target date: 03/28/2022 The patient will be independent in a safe self progression of a home exercise program to promote further recovery of function   Baseline:  Goal status: ongoing  2.  Cervical sidebending ROM improved to 30 degrees and extension to 25 degrees needed for driving Baseline:  Goal status: ongoing   3.  The patient will have improved standing postural alignment with ear lobe to wall measurement 15cm  Baseline:  Goal status: ongoing   4.  The patient will have improved FOTO score to    63%   indicating improved function with less pain  Baseline:  Goal status: goal met  5.  Lower neck pain and interscapular pain decreased by 50% Baseline:  Goal status: ongoing     PLAN: PT FREQUENCY: 1-2x/week  PT DURATION: 8 weeks  PLANNED INTERVENTIONS: Therapeutic exercises, Therapeutic activity, Neuromuscular re-education, Patient/Family education, Self Care, Joint mobilization, Aquatic Therapy, Dry Needling, Spinal mobilization, Cryotherapy, Moist heat,  Taping, Traction, Ultrasound, Manual therapy, and Re-evaluation  PLAN FOR NEXT SESSION:   do Nu-Step; UBE;   postural ex's and progress;  thoracic extension mobility; cervical ROM; manual therapy as needed  Myrene Galas, PTA 03/05/22 3:14 PM

## 2022-03-05 NOTE — Telephone Encounter (Signed)
[place] from [facility] called and states that Dr in office was confused about fax on this patient.

## 2022-03-06 ENCOUNTER — Telehealth: Payer: Self-pay

## 2022-03-06 ENCOUNTER — Encounter: Payer: Self-pay | Admitting: *Deleted

## 2022-03-06 ENCOUNTER — Telehealth: Payer: Self-pay | Admitting: Cardiology

## 2022-03-06 NOTE — Telephone Encounter (Signed)
   Patient Name: Brent Griffith  DOB: 11-10-1935 MRN: [id number]  Primary Cardiologist: Kirk Ruths, MD  Chart reviewed as part of pre-operative protocol coverage.   IF SIMPLE EXTRACTION/CLEANINGS: Simple dental extractions (i.e. 1-2 teeth) are considered low risk procedures per guidelines and generally do not require any specific cardiac clearance. It is also generally accepted that for simple extractions and dental cleanings, there is no need to interrupt blood thinner therapy.   SBE prophylaxis is not required for the patient from a cardiac standpoint.  I will route this recommendation to the requesting party via Epic fax function and remove from pre-op pool.  Please call with questions.  Christell Faith, PA-C 03/06/2022, 11:38 AM

## 2022-03-06 NOTE — Telephone Encounter (Signed)
Dr.Pogacean called to emphasize that patient is in pain and they need clearance to do work in patients mouth due to him being on a blood thinner.  I informed Dr.Pogacean that patients blood thinner is monitored by his cardiologist. I gave Dr.Pogacean the number to Dr.Crenshaw's office and she appeared appreciative.

## 2022-03-06 NOTE — Telephone Encounter (Signed)
   [facility] Pre-operative Risk Assessment    Request for surgical clearance:  What type of surgery is being performed?  Tooth Extraction: #25, #26   When is this surgery scheduled?  TBD--ASAP  What type of clearance is required (medical clearance vs. Pharmacy clearance to hold med vs. Both)?  Both   Are there any medications that need to be held prior to surgery and how long? Their office is requesting our recommendation regarding holding blood thinners   Practice name and name of physician performing surgery?  [facility], DDS   What is your office phone number? [phone]    7.   What is your office fax number [phone] [email or web address]  (Prefers an e-mail if possible)  8.   Anesthesia type (None, local, MAC, general) ?  Local    Zara Council 03/06/2022, 11:27 AM

## 2022-03-07 NOTE — Telephone Encounter (Signed)
Patient Dentist office addressed.

## 2022-03-08 ENCOUNTER — Encounter: Payer: Self-pay | Admitting: *Deleted

## 2022-03-14 ENCOUNTER — Ambulatory Visit: Payer: Medicare Other | Attending: Sports Medicine | Admitting: Physical Therapy

## 2022-03-14 DIAGNOSIS — M4123 Other idiopathic scoliosis, cervicothoracic region: Secondary | ICD-10-CM | POA: Insufficient documentation

## 2022-03-14 DIAGNOSIS — M6281 Muscle weakness (generalized): Secondary | ICD-10-CM | POA: Insufficient documentation

## 2022-03-14 DIAGNOSIS — M542 Cervicalgia: Secondary | ICD-10-CM | POA: Diagnosis not present

## 2022-03-14 DIAGNOSIS — R293 Abnormal posture: Secondary | ICD-10-CM | POA: Insufficient documentation

## 2022-03-14 NOTE — Therapy (Signed)
OUTPATIENT PHYSICAL THERAPY TREATMENT NOTE     Patient Name: Brent Griffith MRN: [id number] DOB:18-Jun-1935, 86 y.o., male Today's Date: 03/14/2022      PT End of Session - 03/14/22 1534     Visit Number 13    Date for PT Re-Evaluation 03/28/22    Authorization Type [employer] Medicare    PT Start Time 3545    PT Stop Time 6256    PT Time Calculation (min) 43 min    Activity Tolerance Patient tolerated treatment well              Past Medical History:  Diagnosis Date   Acute gastric ulcer without mention of hemorrhage, perforation, or obstruction    Allergic rhinitis, cause unspecified    Anemia, unspecified    Arthritis    Asthma    Blood transfusion without reported diagnosis    CAD (coronary artery disease)    Diaphragmatic hernia without mention of obstruction or gangrene    Elevated prostate specific antigen (PSA)    Enlarged prostate    Esophageal reflux    Extrinsic asthma, unspecified    Herpes zoster without mention of complication    Hyperlipidemia    Hypersomnia with sleep apnea, unspecified    Impotence of organic origin    Kyphosis    Lumbago    Obstructive sleep apnea (adult) (pediatric)    Pneumonia    hx of years ago    Senile osteoporosis    Trigger finger (acquired)    Tubular adenoma of colon 05/2013   Unspecified essential hypertension    Unspecified sleep apnea    CPAP- settings 4-12    Unspecified vitamin D deficiency    Past Surgical History:  Procedure Laterality Date   CARDIAC CATHETERIZATION  03/04/2009   Dr [name]   COLONOSCOPY     CORONARY ARTERY BYPASS GRAFT  2010   CYSTOSCOPY WITH RETROGRADE PYELOGRAM, URETEROSCOPY AND STENT PLACEMENT Right 10/04/2013   Procedure: CYSTOSCOPY WITH RETROGRADE PYELOGRAM,  AND STENT PLACEMENT;  Surgeon: Festus Aloe, MD;  Location: [facility];  Service: Urology;  Laterality: Right;   DENTAL SURGERY N/A    MOLE REMOVAL  1958   chin   ROTATOR CUFF REPAIR Left 04/1998   with bone spur removed,  Dr French Ana   TONSILLECTOMY  1945   TRANSURETHRAL RESECTION OF PROSTATE N/A 09/24/2013   Procedure: TRANSURETHRAL RESECTION OF THE PROSTATE (TURP) WITH GYRUS (STAGED RIGHT LATERAL AND MEDIAN LOBE);  Surgeon: Ailene Rud, MD;  Location: [facility];  Service: Urology;  Laterality: N/A;   UPPER GI ENDOSCOPY     Patient Active Problem List   Diagnosis Date Noted   Encounter for therapeutic drug monitoring 10/25/2021   Atrial fibrillation ([place]) 10/25/2021   PVC's (premature ventricular contractions) 05/04/2021   COVID-19 virus infection 02/20/2021   Sinusitis 01/24/2021   New onset atrial fibrillation ([place]) 01/19/2021   AKI (acute kidney injury) ([place]) 01/19/2021   HFrEF (heart failure with reduced ejection fraction) ([place]) 01/19/2021   CAD (coronary artery disease) 10/11/2017   IDA (iron deficiency anemia) 08/31/2016   Seasonal and perennial allergic rhinitis 08/28/2016   Palpitations 09/07/2015   Cough, persistent 07/20/2015   Senile osteoporosis 06/09/2014   Shortness of breath 03/20/2014   Hydronephrosis of right kidney 10/03/2013   Benign prostatic hyperplasia 09/24/2013   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   Dyslipidemia 04/06/2009   CARDIOVASCULAR FUNCTION STUDY, ABNORMAL 03/03/2009   Obstructive sleep apnea 02/04/2009   Essential hypertension 09/02/2008   G E R  D 09/02/2008   PERIODIC LIMB MOVEMENT DISORDER 09/03/2007    PCP: Sherrie Mustache NP  REFERRING PROVIDER: Wandra Feinstein MD  REFERRING DIAG: thoracic spine kyphosis; cervical lordosis; osteoporosis  THERAPY DIAG:  postural dysfunction; neck pain   Rationale for Evaluation and Treatment Rehabilitation  ONSET DATE: 05/07/21  SUBJECTIVE:                                                                                                                                                                                                         SUBJECTIVE STATEMENT: Getting ready to have some teeth pulled.  [facility]  PERTINENT HISTORY:  2 cracked vertebra in cervical spine a few years ago, no known injury Osteoporosis with 2 inch height loss  PAIN:  Are you having pain? yes NPRS scale:  "mild" back of the neck Pain location: lower cervical and between shoulder blades Aggravating factors: walking the dog, sitting at church; trouble getting up off the floor Relieving factors: take a Tylenol  PRECAUTIONS: Other: osteoporosis  WEIGHT BEARING RESTRICTIONS No  FALLS:  Has patient fallen in last 6 months? Yes. Number of falls tripped and did hit the floor  LIVING ENVIRONMENT: Lives with: lives with their spouse Lives in: House/apartment Stairs: Yes: External: 2 steps; on right going up   OCCUPATION:retired  PLOF: Independent with basic ADLs  PATIENT GOALS get my back straightened up  OBJECTIVE:    PATIENT SURVEYS:  12/06/2021:  FOTO 58%  9/27: 69%    COGNITION: Overall cognitive status: Within functional limits for tasks assessed    POSTURE: forward head and increased thoracic kyphosis Measurements standing against the wall:   At rest: ear lobe to wall 21 cm, with effect 18.5 cm ear lobe to wall Shoulders to wall 12 cm  8/30: ear lobe to wall 22 cm  9/27: 17cm ear lobe  10/11: 20 cm ear lobe to wall  11/8:  21 cm ear lobe to wall    CERVICAL ROM:   Active ROM A/PROM (deg) eval 8/23 8/30 9/14 9/27 10/11 11/8  Flexion 55        Extension _0 38 25 32  Right lateral flexion _1 Left lateral flexion _2 Right rotation 40 30 35  40 32 50  Left rotation 50 35 40  45 43 55   (Blank rows = not tested)  UPPER EXTREMITY ROM: bil UE  [place]  UPPER EXTREMITY MMT:  Eval:  grossly 4/5 UEs except middle and lower traps 4-/5  FUNCTIONAL TESTS:  12/12/2021: 5 times sit to stand: 19.3 sec with UE use required 9/27:  20 sec no hands    TODAY'S TREATMENT:  03/14/22: seated UBE Forward/back with head  up looking at the clock for 2x2 Supine on wedge with 1 folded pillow  behind head bench press cane with 6# weight 2x10 Supine on wedge with 1 folded pillow  behind head holding  6# cane overhead with LE marching 10x Supine on wedge with 1 folded pillow  behind head 6# cane on hips with bridge 10x Supine on wedge with 1 folded pillow  behind head yellow band diagonal flexion 10x right/left Supine on wedge with 1 folded pillow  behind head green band diagonal extension 10x right/left  Leaning forward with hands on mat table with head lifts to find target 10x Wall climbs (same side UE/LE lifts for target on wall) 10x Seated isometric head retraction against ball on wall 5 sec hold 10x Seated with head against ball on wall: blue band rows, lat pull downs and bil shoulder extension 10x each Seated thoracic extension over purple ball 10x Back to wall snow angels 20x Back to wall bil UE elevation 10x Nustep L3 old model 6 min to end sesison with VC to watch calendar Neuromuscular re-ed: activation of cervical extensors and scapular retractors Therapeutic activity: standing and sitting with head in neutral with walking the dog, mowing; encouraged elevated book height when reading in bed or with sitting   03/05/22: Supine on wedge with 1 folded pillow  behind head: 5# dumbbell: chops 10x each side; press toward ceiling 10x; overhead to knee 10x Seated thoracic extension with ball 2x10 seated  wall snow angels 10x Seated yellow band face pulls press overhead 5x Seated lat pulls blue band with handles (therapist holding) 2x10 cues for upward/forward gaze counter push ups 10x 2 Facing wall double arm slides up wall 10x, single arm with same side leg lift 10x on each side Back to wall with forward gaze with cervical rotation 10x VC to move neck and not just eyes Back to wall with head push into pink ball with progressively longer holds up to 10 sec seated UBE reverse with head up looking at the clock  for 1 min 3x Nustep L2 old model 5 min to end sesison with VC to watch calendar   PATIENT EDUCATION:  Education details: initial HEP with green band Person educated: Patient Education method: Explanation, Demonstration, and Handouts Education comprehension: verbalized understanding and returned demonstration   HOME EXERCISE PROGRAM: Access Code: [facility] URL: [email or web address] Date: 12/06/2021 Prepared by: Ruben Im  Exercises - Standing Bilateral Low Shoulder Row with Anchored Resistance  - 1 x daily - 7 x weekly - 2 sets - 10 reps - Shoulder Extension with Resistance Hands Down  - 1 x daily - 7 x weekly - 2 sets - 10 reps - Seated Shoulder Horizontal Abduction with Resistance - Thumbs Up  - 1 x daily - 7 x weekly - 2 sets - 10 reps - Seated Thoracic Lumbar Extension with Pectoralis Stretch  - 1 x daily - 7 x weekly - 1 sets - 10 reps  ASSESSMENT:  CLINICAL IMPRESSION: Improvement in cervical rotation ROM.  Fewer verbal cues needed for head up/forward gaze with ex's.  Patient is highly motivated with exercise.  Therapist monitoring response to all and adding increased challenge and intensity to ex's.  OBJECTIVE IMPAIRMENTS decreased ROM, decreased strength, impaired UE functional use, postural dysfunction, and pain.   ACTIVITY LIMITATIONS carrying, lifting, and reading, sitting at church  PARTICIPATION LIMITATIONS: driving, shopping, community activity, and church  PERSONAL FACTORS Age and Time since onset of injury/illness/exacerbation are also affecting patient's functional outcome.   REHAB POTENTIAL: Good  CLINICAL DECISION MAKING: Stable/uncomplicated  EVALUATION COMPLEXITY: Low   GOALS: Goals reviewed with patient? Yes  SHORT TERM GOALS: Target date: 01/03/2022   The patient will demonstrate knowledge of basic self care strategies and exercises to promote healing  (using lumbar roll, book prop) Baseline:  Goal status: goal met  8/30  2.  Improved postural alignment with ear lobe to wall measurement decreased to 17 cm Baseline: 22 cm Goal status:  ongoing  3.  Improved rotation ROM to 55 degrees bil needed for driving Baseline:  Goal status: ongoing  4.  The patient will report lower cervical and periscapular pain level decreased by 25% Baseline:   Goal status: goal met  9/27 30%    LONG TERM GOALS: Target date: 03/28/2022 The patient will be independent in a safe self progression of a home exercise program to promote further recovery of function   Baseline:  Goal status: ongoing  2.  Cervical sidebending ROM improved to 30 degrees and extension to 25 degrees needed for driving Baseline:  Goal status: ongoing   3.  The patient will have improved standing postural alignment with ear lobe to wall measurement 15cm  Baseline:  Goal status: ongoing   4.  The patient will have improved FOTO score to    63%   indicating improved function with less pain  Baseline:  Goal status: goal met  5.  Lower neck pain and interscapular pain decreased by 50% Baseline:  Goal status: ongoing     PLAN: PT FREQUENCY: 1-2x/week  PT DURATION: 8 weeks  PLANNED INTERVENTIONS: Therapeutic exercises, Therapeutic activity, Neuromuscular re-education, Patient/Family education, Self Care, Joint mobilization, Aquatic Therapy, Dry Needling, Spinal mobilization, Cryotherapy, Moist heat, Taping, Traction, Ultrasound, Manual therapy, and Re-evaluation  PLAN FOR NEXT SESSION:    Nu-Step; UBE;   postural ex's and progress;  thoracic extension mobility; cervical ROM; manual therapy as needed  Ruben Im, PT 03/14/22 4:24 PM Phone: [phone] Fax: [phone]

## 2022-03-16 ENCOUNTER — Ambulatory Visit (INDEPENDENT_AMBULATORY_CARE_PROVIDER_SITE_OTHER): Payer: Medicare Other | Admitting: Nurse Practitioner

## 2022-03-16 ENCOUNTER — Encounter: Payer: Self-pay | Admitting: Nurse Practitioner

## 2022-03-16 VITALS — BP 138/60 | HR 59 | Temp 97.8°F | Resp 18 | Ht 62.0 in | Wt 128.0 lb

## 2022-03-16 DIAGNOSIS — D508 Other iron deficiency anemias: Secondary | ICD-10-CM | POA: Diagnosis not present

## 2022-03-16 DIAGNOSIS — N401 Enlarged prostate with lower urinary tract symptoms: Secondary | ICD-10-CM | POA: Diagnosis not present

## 2022-03-16 DIAGNOSIS — E7800 Pure hypercholesterolemia, unspecified: Secondary | ICD-10-CM

## 2022-03-16 DIAGNOSIS — I2510 Atherosclerotic heart disease of native coronary artery without angina pectoris: Secondary | ICD-10-CM

## 2022-03-16 DIAGNOSIS — R3912 Poor urinary stream: Secondary | ICD-10-CM | POA: Diagnosis not present

## 2022-03-16 DIAGNOSIS — I480 Paroxysmal atrial fibrillation: Secondary | ICD-10-CM

## 2022-03-16 DIAGNOSIS — Z23 Encounter for immunization: Secondary | ICD-10-CM

## 2022-03-16 DIAGNOSIS — Z599 Problem related to housing and economic circumstances, unspecified: Secondary | ICD-10-CM

## 2022-03-16 DIAGNOSIS — I10 Essential (primary) hypertension: Secondary | ICD-10-CM

## 2022-03-16 NOTE — Progress Notes (Unsigned)
Careteam: Brent Griffith Care Team: [name], NP as PCP - General (Geriatric Medicine) Stanford Breed Denice Bors, MD as PCP - Cardiology (Cardiology) [name], MD as Consulting Physician (Sports Medicine) Clent Jacks, MD as Referring Physician (Ophthalmology)  PLACE OF SERVICE:  [place] Directive information Does Brent Griffith Have a Medical Advance Directive?: No, Would Brent Griffith like information on creating a medical advance directive?: No - Brent Griffith declined  Allergies  Allergen Reactions   Compazine [Prochlorperazine Edisylate] Anxiety    Chief Complaint  Brent Griffith presents with   Medical Management of Chronic Issues    6 month follow up.   Immunizations    Discussed covid, and tetanus vaccine Brent Griffith declined colonoscopy     HPI: Brent Griffith is a 86 y.o. male for routine follow up.   He is in need of work done in his mouth. Needing some teeth removed but has to get this cleared through the coumadin clinic.  Went to ED after a fall, no other falls and work up was negative.   Weight trending up.   No chest pains, shortness of breath, LE edema.  Going to rehab for PT due to pain from scoliosis and weakness.   Got flu shot today  Iron def anemia- taking supplements twice daily, no bloody stools that he is aware of. Dark from iron.   Bph- followed by urology, recent PSA normal.    Review of Systems:  Review of Systems  Constitutional:  Negative for chills, fever and weight loss.  HENT:  Negative for tinnitus.   Respiratory:  Negative for cough, sputum production and shortness of breath.   Cardiovascular:  Negative for chest pain, palpitations and leg swelling.  Gastrointestinal:  Negative for abdominal pain, constipation, diarrhea and heartburn.  Genitourinary:  Negative for dysuria, frequency and urgency.  Musculoskeletal:  Positive for back pain and myalgias. Negative for falls and joint pain.  Skin: Negative.   Neurological:  Negative for dizziness  and headaches.  Psychiatric/Behavioral:  Negative for depression and memory loss. The Brent Griffith does not have insomnia.     Past Medical History:  Diagnosis Date   Acute gastric ulcer without mention of hemorrhage, perforation, or obstruction    Allergic rhinitis, cause unspecified    Anemia, unspecified    Arthritis    Asthma    Blood transfusion without reported diagnosis    CAD (coronary artery disease)    Diaphragmatic hernia without mention of obstruction or gangrene    Elevated prostate specific antigen (PSA)    Enlarged prostate    Esophageal reflux    Extrinsic asthma, unspecified    Herpes zoster without mention of complication    Hyperlipidemia    Hypersomnia with sleep apnea, unspecified    Impotence of organic origin    Kyphosis    Lumbago    Obstructive sleep apnea (adult) (pediatric)    Pneumonia    hx of years ago    Senile osteoporosis    Trigger finger (acquired)    Tubular adenoma of colon 05/2013   Unspecified essential hypertension    Unspecified sleep apnea    CPAP- settings 4-12    Unspecified vitamin D deficiency    Past Surgical History:  Procedure Laterality Date   CARDIAC CATHETERIZATION  03/04/2009   Dr [name]   COLONOSCOPY     CORONARY ARTERY BYPASS GRAFT  2010   CYSTOSCOPY WITH RETROGRADE PYELOGRAM, URETEROSCOPY AND STENT PLACEMENT Right 10/04/2013   Procedure: CYSTOSCOPY WITH RETROGRADE PYELOGRAM,  AND STENT PLACEMENT;  Surgeon: Festus Aloe, MD;  Location: [facility];  Service: Urology;  Laterality: Right;   DENTAL SURGERY N/A    MOLE REMOVAL  1958   chin   ROTATOR CUFF REPAIR Left 04/1998   with bone spur removed, Dr French Ana   TONSILLECTOMY  1945   TRANSURETHRAL RESECTION OF PROSTATE N/A 09/24/2013   Procedure: TRANSURETHRAL RESECTION OF THE PROSTATE (TURP) WITH GYRUS (STAGED RIGHT LATERAL AND MEDIAN LOBE);  Surgeon: Ailene Rud, MD;  Location: [facility];  Service: Urology;  Laterality: N/A;   UPPER GI ENDOSCOPY     Social  History:   reports that he has never smoked. He has never been exposed to tobacco smoke. He has never used smokeless tobacco. He reports that he does not drink alcohol and does not use drugs.  Family History  Problem Relation Age of Onset   Stroke Mother    Breast cancer Mother    Cirrhosis Mother        wine   Rheumatic fever Mother    Kyphosis Mother    Hypertension Father    Stroke Father    Heart disease Father    Heart disease Paternal Grandfather    Colon cancer Neg Hx     Medications: Brent Griffith's Medications  New Prescriptions   No medications on file  Previous Medications   ALBUTEROL (VENTOLIN HFA) 108 (90 BASE) MCG/ACT INHALER    USE 1 TO 2 INHALATIONS BY  MOUTH EVERY 6 HOURS AS  NEEDED FOR WHEEZING OR  SHORTNESS OF BREATH   AMIODARONE (PACERONE) 200 MG TABLET    Take 200 mg by mouth daily.   CALCIUM CITRATE-VITAMIN D (EQ CALCIUM CITRATE+D3 PO)    Take 1,200 mg by mouth daily.   CARVEDILOL (COREG) 6.25 MG TABLET    Take 1 tablet (6.25 mg total) by mouth 2 (two) times daily.   CHOLECALCIFEROL (VITAMIN D-3) 5000 UNITS TABS    Take 10,000 Units by mouth daily.   COENZYME Q10 (COQ10) 200 MG CAPS    Take 200 mg by mouth daily.   DOXAZOSIN (CARDURA) 2 MG TABLET    TAKE 1 TABLET BY MOUTH  DAILY   EMPAGLIFLOZIN (JARDIANCE) 10 MG TABS TABLET    Take 1 tablet (10 mg total) by mouth daily before breakfast.   FEXOFENADINE (ALLEGRA) 180 MG TABLET    Take 180 mg by mouth daily as needed for allergies.   FINASTERIDE (PROSCAR) 5 MG TABLET    TAKE 1 TABLET BY MOUTH IN  THE MORNING   FOLIC ACID (FOLVITE) 1 MG TABLET    TAKE 1 TABLET BY MOUTH  DAILY   FUROSEMIDE (LASIX) 20 MG TABLET    TAKE 1 TABLET BY MOUTH DAILY AS  NEEDED FOR LEG SWELLING   IRON, FERROUS SULFATE, 325 (65 FE) MG TABS    Take 325 mg by mouth 2 (two) times daily.   LOSARTAN (COZAAR) 25 MG TABLET    Take 1 tablet (25 mg total) by mouth daily.   MEXILETINE (MEXITIL) 150 MG CAPSULE    Take 1 capsule (150 mg total) by mouth 2  (two) times daily.   OVER THE COUNTER MEDICATION    Take 1-2 tablets by mouth at bedtime as needed (pain). Percogosic (tylenol with allergy medicine)   PANTOPRAZOLE (PROTONIX) 40 MG TABLET    TAKE 1 TABLET BY MOUTH  DAILY   ROSUVASTATIN (CRESTOR) 20 MG TABLET    TAKE 1 TABLET BY MOUTH ONCE DAILY FOR CHOLESTEROL   WARFARIN (COUMADIN) 2.5 MG  TABLET    Take 1/2 a tablet daily except for 1 tablet on Monday, Wednesday and Friday as directed by coumadin clinic  Modified Medications   No medications on file  Discontinued Medications   ACETAMINOPHEN (TYLENOL) 650 MG CR TABLET    Take 650 mg by mouth as needed for pain.   BENZONATATE (TESSALON) 100 MG CAPSULE    Take 1 capsule (100 mg total) by mouth 3 (three) times daily as needed for cough.    Physical Exam:  Vitals:   03/16/22 1428  BP: 138/60  Pulse: (!) 59  Resp: 18  Temp: 97.8 F (36.6 C)  TempSrc: Temporal  SpO2: 96%  Weight: 128 lb (58.1 kg)  Height: '5\' 2"'$  (1.575 m)   Body mass index is 23.41 kg/m. Wt Readings from Last 3 Encounters:  03/16/22 128 lb (58.1 kg)  02/15/22 123 lb (55.8 kg)  02/07/22 126 lb 3.2 oz (57.2 kg)    Physical Exam***  Labs reviewed: Basic Metabolic Panel: Recent Labs    06/02/21 1538 06/28/21 1505 09/08/21 1445  NA 138 141 139  K 4.1 4.7 3.9  CL 105 102 106  CO2 '26 23 25  '$ GLUCOSE 77 97 101  BUN '24 22 25  '$ CREATININE 1.28* 1.38* 1.35*  CALCIUM 8.8 9.4 8.8  MG  --  2.2  --   TSH  --  1.330  --    Liver Function Tests: Recent Labs    05/05/21 1414 06/28/21 1505 09/08/21 1445  AST 21 45* 33  ALT 23 41 35  ALKPHOS  --  69  --   BILITOT 0.8 0.7 0.6  PROT 6.0* 6.6 6.2  ALBUMIN  --  4.4  --    No results for input(s): "LIPASE", "AMYLASE" in the last 8760 hours. No results for input(s): "AMMONIA" in the last 8760 hours. CBC: Recent Labs    05/17/21 1600 09/08/21 1445 12/08/21 1139  WBC 5.3 5.2 5.3  NEUTROABS 3,689 3,411 3,636  HGB 10.0* 10.7* 12.0*  HCT 29.0* 32.6* 34.4*  MCV  90.9 84.2 93.2  PLT 211 191 139*   Lipid Panel: Recent Labs    05/05/21 1414  CHOL 107  HDL 57  LDLCALC 38  TRIG 42  CHOLHDL 1.9   TSH: Recent Labs    06/28/21 1505  TSH 1.330   A1C: Lab Results  Component Value Date   HGBA1C 5.6 07/28/2012     Assessment/Plan 1. Iron deficiency anemia secondary to inadequate dietary iron intake *** - CBC with Differential/Platelet - Iron, TIBC and Ferritin Panel  2. Benign prostatic hyperplasia with weak urinary stream ***  3. Coronary artery disease involving native coronary artery of native heart without angina pectoris ***  4. Essential hypertension *** - COMPLETE METABOLIC PANEL WITH GFR  5. Paroxysmal atrial fibrillation (HCC) ***  6. Flu vaccine need *** - Flu Vaccine QUAD High Dose(Fluad)  7. Pure hypercholesterolemia *** - Lipid panel  8. Financial difficulty *** - AMB Referral to [facility]     No follow-ups on file.: *** Dayleen Beske K. [place], [facility] [phone]

## 2022-03-17 LAB — CBC WITH DIFFERENTIAL/PLATELET
Absolute Monocytes: 431 cells/uL (ref 200–950)
Basophils Absolute: 29 cells/uL (ref 0–200)
Basophils Relative: 0.6 %
Eosinophils Absolute: 69 cells/uL (ref 15–500)
Eosinophils Relative: 1.4 %
HCT: 35.5 % — ABNORMAL LOW (ref 38.5–50.0)
Hemoglobin: 12.9 g/dL — ABNORMAL LOW (ref 13.2–17.1)
Lymphs Abs: 897 cells/uL (ref 850–3900)
MCH: 34.6 pg — ABNORMAL HIGH (ref 27.0–33.0)
MCHC: 36.3 g/dL — ABNORMAL HIGH (ref 32.0–36.0)
MCV: 95.2 fL (ref 80.0–100.0)
MPV: 10.6 fL (ref 7.5–12.5)
Monocytes Relative: 8.8 %
Neutro Abs: 3474 cells/uL (ref 1500–7800)
Neutrophils Relative %: 70.9 %
Platelets: 178 10*3/uL (ref 140–400)
RBC: 3.73 10*6/uL — ABNORMAL LOW (ref 4.20–5.80)
RDW: 12.1 % (ref 11.0–15.0)
Total Lymphocyte: 18.3 %
WBC: 4.9 10*3/uL (ref 3.8–10.8)

## 2022-03-17 LAB — COMPLETE METABOLIC PANEL WITH GFR
AG Ratio: 1.5 (calc) (ref 1.0–2.5)
ALT: 30 U/L (ref 9–46)
AST: 29 U/L (ref 10–35)
Albumin: 3.7 g/dL (ref 3.6–5.1)
Alkaline phosphatase (APISO): 49 U/L (ref 35–144)
BUN/Creatinine Ratio: 17 (calc) (ref 6–22)
BUN: 23 mg/dL (ref 7–25)
CO2: 28 mmol/L (ref 20–32)
Calcium: 8.5 mg/dL — ABNORMAL LOW (ref 8.6–10.3)
Chloride: 104 mmol/L (ref 98–110)
Creat: 1.33 mg/dL — ABNORMAL HIGH (ref 0.70–1.22)
Globulin: 2.4 g/dL (calc) (ref 1.9–3.7)
Glucose, Bld: 118 mg/dL — ABNORMAL HIGH (ref 65–99)
Potassium: 3.9 mmol/L (ref 3.5–5.3)
Sodium: 138 mmol/L (ref 135–146)
Total Bilirubin: 0.9 mg/dL (ref 0.2–1.2)
Total Protein: 6.1 g/dL (ref 6.1–8.1)
eGFR: 53 mL/min/{1.73_m2} — ABNORMAL LOW (ref >=60)

## 2022-03-17 LAB — LIPID PANEL
Cholesterol: 122 mg/dL (ref <200)
HDL: 62 mg/dL (ref >=40)
LDL Cholesterol (Calc): 46 mg/dL (calc)
Non-HDL Cholesterol (Calc): 60 mg/dL (calc) (ref <130)
Total CHOL/HDL Ratio: 2 (calc) (ref <5.0)
Triglycerides: 65 mg/dL (ref <150)

## 2022-03-17 LAB — IRON,TIBC AND FERRITIN PANEL
%SAT: 34 % (calc) (ref 20–48)
Ferritin: 75 ng/mL (ref 24–380)
Iron: 96 ug/dL (ref 50–180)
TIBC: 284 mcg/dL (calc) (ref 250–425)

## 2022-03-19 ENCOUNTER — Telehealth: Payer: Self-pay

## 2022-03-19 NOTE — Telephone Encounter (Signed)
   Telephone encounter was:  Unsuccessful.  03/19/2022 Name: SHAUNAK KREIS MRN: [id number] DOB: August 05, 1935  Unsuccessful outbound call made today to assist with:  Food Insecurity and financial.  Outreach Attempt:  1st Attempt  A HIPAA compliant voice message was left requesting a return call.  Instructed patient to call back at [phone].  [place] Resource Care Guide   ??[email or web address]  ?? [phone]   Website: [email or web address]  [email or web address]

## 2022-03-21 ENCOUNTER — Telehealth: Payer: Self-pay

## 2022-03-21 ENCOUNTER — Ambulatory Visit: Payer: Medicare Other | Admitting: Physical Therapy

## 2022-03-21 DIAGNOSIS — M542 Cervicalgia: Secondary | ICD-10-CM

## 2022-03-21 DIAGNOSIS — R293 Abnormal posture: Secondary | ICD-10-CM | POA: Diagnosis not present

## 2022-03-21 DIAGNOSIS — M4123 Other idiopathic scoliosis, cervicothoracic region: Secondary | ICD-10-CM | POA: Diagnosis not present

## 2022-03-21 DIAGNOSIS — M6281 Muscle weakness (generalized): Secondary | ICD-10-CM | POA: Diagnosis not present

## 2022-03-21 NOTE — Telephone Encounter (Signed)
   Telephone encounter was:  Unsuccessful.  03/21/2022 Name: Brent Griffith MRN: [id number] DOB: 06-25-1935  Unsuccessful outbound call made today to assist with:  Food Insecurity and financial.  Outreach Attempt:  2nd Attempt  A HIPAA compliant voice message was left requesting a return call.  Instructed patient to call back at [phone].  [place] Resource Care Guide   ??[name]  ?? [phone]   Website: [email or web address]  [email or web address]

## 2022-03-21 NOTE — Therapy (Signed)
OUTPATIENT PHYSICAL THERAPY TREATMENT NOTE     Patient Name: Brent Griffith MRN: [id number] DOB:Oct 09, 1935, 86 y.o., male Today's Date: 03/21/2022      PT End of Session - 03/21/22 1536     Visit Number 14    Date for PT Re-Evaluation 03/28/22    Authorization Type [employer] Medicare    PT Start Time 0100    PT Stop Time 7121    PT Time Calculation (min) 43 min    Activity Tolerance Patient tolerated treatment well              Past Medical History:  Diagnosis Date   Acute gastric ulcer without mention of hemorrhage, perforation, or obstruction    Allergic rhinitis, cause unspecified    Anemia, unspecified    Arthritis    Asthma    Blood transfusion without reported diagnosis    CAD (coronary artery disease)    Diaphragmatic hernia without mention of obstruction or gangrene    Elevated prostate specific antigen (PSA)    Enlarged prostate    Esophageal reflux    Extrinsic asthma, unspecified    Herpes zoster without mention of complication    Hyperlipidemia    Hypersomnia with sleep apnea, unspecified    Impotence of organic origin    Kyphosis    Lumbago    Obstructive sleep apnea (adult) (pediatric)    Pneumonia    hx of years ago    Senile osteoporosis    Trigger finger (acquired)    Tubular adenoma of colon 05/2013   Unspecified essential hypertension    Unspecified sleep apnea    CPAP- settings 4-12    Unspecified vitamin D deficiency    Past Surgical History:  Procedure Laterality Date   CARDIAC CATHETERIZATION  03/04/2009   Dr [name]   COLONOSCOPY     CORONARY ARTERY BYPASS GRAFT  2010   CYSTOSCOPY WITH RETROGRADE PYELOGRAM, URETEROSCOPY AND STENT PLACEMENT Right 10/04/2013   Procedure: CYSTOSCOPY WITH RETROGRADE PYELOGRAM,  AND STENT PLACEMENT;  Surgeon: Festus Aloe, MD;  Location: [facility];  Service: Urology;  Laterality: Right;   DENTAL SURGERY N/A    MOLE REMOVAL  1958   chin   ROTATOR CUFF REPAIR Left 04/1998   with bone spur  removed, Dr French Ana   TONSILLECTOMY  1945   TRANSURETHRAL RESECTION OF PROSTATE N/A 09/24/2013   Procedure: TRANSURETHRAL RESECTION OF THE PROSTATE (TURP) WITH GYRUS (STAGED RIGHT LATERAL AND MEDIAN LOBE);  Surgeon: Ailene Rud, MD;  Location: [facility];  Service: Urology;  Laterality: N/A;   UPPER GI ENDOSCOPY     Patient Active Problem List   Diagnosis Date Noted   Encounter for therapeutic drug monitoring 10/25/2021   Atrial fibrillation ([place]) 10/25/2021   PVC's (premature ventricular contractions) 05/04/2021   COVID-19 virus infection 02/20/2021   Sinusitis 01/24/2021   New onset atrial fibrillation ([place]) 01/19/2021   AKI (acute kidney injury) ([place]) 01/19/2021   HFrEF (heart failure with reduced ejection fraction) ([place]) 01/19/2021   CAD (coronary artery disease) 10/11/2017   IDA (iron deficiency anemia) 08/31/2016   Seasonal and perennial allergic rhinitis 08/28/2016   Palpitations 09/07/2015   Cough, persistent 07/20/2015   Senile osteoporosis 06/09/2014   Shortness of breath 03/20/2014   Hydronephrosis of right kidney 10/03/2013   Benign prostatic hyperplasia 09/24/2013   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   Dyslipidemia 04/06/2009   CARDIOVASCULAR FUNCTION STUDY, ABNORMAL 03/03/2009   Obstructive sleep apnea 02/04/2009   Essential hypertension 09/02/2008   G E R  D 09/02/2008   PERIODIC LIMB MOVEMENT DISORDER 09/03/2007    PCP: Sherrie Mustache NP  REFERRING PROVIDER: Wandra Feinstein MD  REFERRING DIAG: thoracic spine kyphosis; cervical lordosis; osteoporosis  THERAPY DIAG:  postural dysfunction; neck pain   Rationale for Evaluation and Treatment Rehabilitation  ONSET DATE: 05/07/21  SUBJECTIVE:                                                                                                                                                                                                         SUBJECTIVE STATEMENT: I walked 7,000 steps yesterday from getting the leaves  up.  My neck is a little sore today.    PERTINENT HISTORY:  2 cracked vertebra in cervical spine a few years ago, no known injury Osteoporosis with 2 inch height loss  PAIN:  Are you having pain? yes NPRS scale:  neck soreness "a little" Pain location: lower cervical and between shoulder blades Aggravating factors: walking the dog, sitting at church; trouble getting up off the floor Relieving factors: take a Tylenol  PRECAUTIONS: Other: osteoporosis  WEIGHT BEARING RESTRICTIONS No  FALLS:  Has patient fallen in last 6 months? Yes. Number of falls tripped and did hit the floor  LIVING ENVIRONMENT: Lives with: lives with their spouse Lives in: House/apartment Stairs: Yes: External: 2 steps; on right going up   OCCUPATION:retired  PLOF: Independent with basic ADLs  PATIENT GOALS get my back straightened up  OBJECTIVE:    PATIENT SURVEYS:  12/06/2021:  FOTO 58%  9/27: 69%    COGNITION: Overall cognitive status: Within functional limits for tasks assessed    POSTURE: forward head and increased thoracic kyphosis Measurements standing against the wall:   At rest: ear lobe to wall 21 cm, with effect 18.5 cm ear lobe to wall Shoulders to wall 12 cm  8/30: ear lobe to wall 22 cm  9/27: 17cm ear lobe  10/11: 20 cm ear lobe to wall  11/8:  21 cm ear lobe to wall    CERVICAL ROM:   Active ROM A/PROM (deg) eval 8/23 8/30 9/14 9/27 10/11 11/8  Flexion 55        Extension _0 38 25 32  Right lateral flexion _1 Left lateral flexion _2 Right rotation 40 30 35  40 32 50  Left rotation 50 35 40  45 43 55   (Blank rows = not tested)  UPPER EXTREMITY ROM: bil UE WFLS  UPPER EXTREMITY  MMT:  Eval:  grossly 4/5 UEs except middle and lower traps 4-/5  FUNCTIONAL TESTS:  12/12/2021: 5 times sit to stand: 19.3 sec with UE use required 9/27:  20 sec no hands    TODAY'S TREATMENT:   11/15: seated UBE Forward/back with head  up looking at the clock for 2x2 Nustep L3 old model 5 min  with VC to watch calendar Supine on wedge with 1 folded pillow  behind head bench press cane with 10# weight x10;  10x rotate "steering wheel" Supine on wedge with 1 folded pillow  behind head 10# cane on hips with bridge 10x Seated with ball behind back with 5# overhead dumbbell press 10x Seated with head against ball, holding 5# overhead hold with marching 30 sec each side Wall push ups 10x Wall climbs (same side UE/LE lifts for target on wall) 10x Seated isometric head retraction against ball on wall 5 sec hold 10x Back to wall snow angels 5x Back to wall bil UE elevation 5x Neuromuscular re-ed: activation of cervical extensors and scapular retractors Therapeutic activity: standing and sitting with head in neutral with walking the dog, mowing; encouraged elevated book height when reading in bed or with sitting     03/14/22: seated UBE Forward/back with head up looking at the clock for 2x2 Supine on wedge with 1 folded pillow  behind head bench press cane with 6# weight 2x10 Supine on wedge with 1 folded pillow  behind head holding  6# cane overhead with LE marching 10x Supine on wedge with 1 folded pillow  behind head 6# cane on hips with bridge 10x Supine on wedge with 1 folded pillow  behind head yellow band diagonal flexion 10x right/left Supine on wedge with 1 folded pillow  behind head green band diagonal extension 10x right/left  Leaning forward with hands on mat table with head lifts to find target 10x Wall climbs (same side UE/LE lifts for target on wall) 10x Seated isometric head retraction against ball on wall 5 sec hold 10x Seated with head against ball on wall: blue band rows, lat pull downs and bil shoulder extension 10x each Seated thoracic extension over purple ball 10x Back to wall snow angels 20x Back to wall bil UE elevation 10x Nustep L3 old model 6 min to end sesison with VC to watch  calendar Neuromuscular re-ed: activation of cervical extensors and scapular retractors Therapeutic activity: standing and sitting with head in neutral with walking the dog, mowing; encouraged elevated book height when reading in bed or with sitting   PATIENT EDUCATION:  Education details: initial HEP with green band Person educated: Patient Education method: Explanation, Demonstration, and Handouts Education comprehension: verbalized understanding and returned demonstration   HOME EXERCISE PROGRAM: Access Code: [facility] URL: [email or web address] Date: 12/06/2021 Prepared by: Ruben Im  Exercises - Standing Bilateral Low Shoulder Row with Anchored Resistance  - 1 x daily - 7 x weekly - 2 sets - 10 reps - Shoulder Extension with Resistance Hands Down  - 1 x daily - 7 x weekly - 2 sets - 10 reps - Seated Shoulder Horizontal Abduction with Resistance - Thumbs Up  - 1 x daily - 7 x weekly - 2 sets - 10 reps - Seated Thoracic Lumbar Extension with Pectoralis Stretch  - 1 x daily - 7 x weekly - 1 sets - 10 reps  ASSESSMENT:  CLINICAL IMPRESSION: Improved cervical extensor muscle strength and endurance needed to hold head in neutral for longer periods of time.  Less  verbal cueing needed for head up/eyes up.  Patient able to progress weights/resistance in several exercises today without exacerbation of pain.  Patient reports increased fatigue today therefore number of reps decreased toward end of session.        OBJECTIVE IMPAIRMENTS decreased ROM, decreased strength, impaired UE functional use, postural dysfunction, and pain.   ACTIVITY LIMITATIONS carrying, lifting, and reading, sitting at church  PARTICIPATION LIMITATIONS: driving, shopping, community activity, and church  PERSONAL FACTORS Age and Time since onset of injury/illness/exacerbation are also affecting patient's functional outcome.   REHAB POTENTIAL: Good  CLINICAL DECISION MAKING:  Stable/uncomplicated  EVALUATION COMPLEXITY: Low   GOALS: Goals reviewed with patient? Yes  SHORT TERM GOALS: Target date: 01/03/2022   The patient will demonstrate knowledge of basic self care strategies and exercises to promote healing  (using lumbar roll, book prop) Baseline:  Goal status: goal met 8/30  2.  Improved postural alignment with ear lobe to wall measurement decreased to 17 cm Baseline: 22 cm Goal status:  ongoing  3.  Improved rotation ROM to 55 degrees bil needed for driving Baseline:  Goal status: ongoing  4.  The patient will report lower cervical and periscapular pain level decreased by 25% Baseline:   Goal status: goal met  9/27 30%    LONG TERM GOALS: Target date: 03/28/2022 The patient will be independent in a safe self progression of a home exercise program to promote further recovery of function   Baseline:  Goal status: ongoing  2.  Cervical sidebending ROM improved to 30 degrees and extension to 25 degrees needed for driving Baseline:  Goal status: ongoing   3.  The patient will have improved standing postural alignment with ear lobe to wall measurement 15cm  Baseline:  Goal status: ongoing   4.  The patient will have improved FOTO score to    63%   indicating improved function with less pain  Baseline:  Goal status: goal met  5.  Lower neck pain and interscapular pain decreased by 50% Baseline:  Goal status: ongoing     PLAN: PT FREQUENCY: 1-2x/week  PT DURATION: 8 weeks  PLANNED INTERVENTIONS: Therapeutic exercises, Therapeutic activity, Neuromuscular re-education, Patient/Family education, Self Care, Joint mobilization, Aquatic Therapy, Dry Needling, Spinal mobilization, Cryotherapy, Moist heat, Taping, Traction, Ultrasound, Manual therapy, and Re-evaluation  PLAN FOR NEXT SESSION:    ERO; FOTO; pt may go to the gym and want to do periodic follow up 1-2 visits; try leg press pt request;  Nu-Step; UBE;   postural ex's and progress;   thoracic extension mobility; cervical ROM; manual therapy as needed  Ruben Im, PT 03/21/22 4:09 PM Phone: [phone] Fax: [phone]

## 2022-03-22 ENCOUNTER — Telehealth: Payer: Self-pay

## 2022-03-22 NOTE — Telephone Encounter (Signed)
   Telephone encounter was:  Unsuccessful.  03/22/2022 Name: Brent Griffith MRN: [id number] DOB: 1936/02/21  Unsuccessful outbound call made today to assist with:  Food Insecurity and financial.  Outreach Attempt:  3rd Attempt.  Referral closed unable to contact patient.  A HIPAA compliant voice message was left requesting a return call.  Instructed patient to call back at [phone].  [place] Resource Care Guide   [email or web address]  ?? [phone]   Website: [email or web address]  [email or web address]

## 2022-03-26 ENCOUNTER — Other Ambulatory Visit (HOSPITAL_BASED_OUTPATIENT_CLINIC_OR_DEPARTMENT_OTHER): Payer: Self-pay | Admitting: General Practice

## 2022-03-26 ENCOUNTER — Telehealth: Payer: Self-pay

## 2022-03-26 ENCOUNTER — Ambulatory Visit: Payer: Medicare Other | Attending: Cardiology

## 2022-03-26 DIAGNOSIS — Z5181 Encounter for therapeutic drug level monitoring: Secondary | ICD-10-CM

## 2022-03-26 DIAGNOSIS — I4891 Unspecified atrial fibrillation: Secondary | ICD-10-CM | POA: Diagnosis not present

## 2022-03-26 LAB — POCT INR: INR: 4 — AB (ref 2.0–3.0)

## 2022-03-26 NOTE — Patient Instructions (Addendum)
Description   USE WARFARIN 2.'5mg'$  tablets (Mint green color). Hold tomorrow's dose and then continue to take Warfarin 1/2 a tablet daily except for 1 tablet on Monday, Wednesday and Friday. Recheck INR in 3 weeks.   Pt drink's boost daily. [facility] [phone]

## 2022-03-26 NOTE — Telephone Encounter (Signed)
   Telephone encounter was:  Unsuccessful.  03/26/2022 Name: Brent Griffith MRN: [id number] DOB: 04-Dec-1935  Unsuccessful outbound call made today to assist with:  Food Insecurity and financial.  Outreach Attempt:  3rd Attempt.  Referral closed unable to contact patient.  A HIPAA compliant voice message was left requesting a return call.  Instructed patient to call back at [phone].  [place] Resource Care Guide   [name]  ?? [phone]   Website: [email or web address]  [email or web address]

## 2022-03-27 ENCOUNTER — Ambulatory Visit: Payer: Medicare Other | Admitting: Physical Therapy

## 2022-03-27 ENCOUNTER — Telehealth: Payer: Self-pay

## 2022-03-27 DIAGNOSIS — M4123 Other idiopathic scoliosis, cervicothoracic region: Secondary | ICD-10-CM

## 2022-03-27 DIAGNOSIS — R293 Abnormal posture: Secondary | ICD-10-CM

## 2022-03-27 DIAGNOSIS — M542 Cervicalgia: Secondary | ICD-10-CM | POA: Diagnosis not present

## 2022-03-27 DIAGNOSIS — M6281 Muscle weakness (generalized): Secondary | ICD-10-CM | POA: Diagnosis not present

## 2022-03-27 NOTE — Telephone Encounter (Signed)
   Telephone encounter was:  Successful.  03/27/2022 Name: Brent Griffith MRN: [id number] DOB: 1935/07/09  [name] is a 86 y.o. year old male who is a primary care patient of [name], NP . The community resource team was consulted for assistance with Food Insecurity and financial.  Care guide performed the following interventions: Received message from patient requesting that I mail resources for food pantries and financial assistance to his home address. Letter saved in Epic.  Follow Up Plan:  No further follow up planned at this time. The patient has been provided with needed resources.  [place] Resource Care Guide   [email or web address]  ?? [phone]   Website: [email or web address]  [email or web address]

## 2022-03-27 NOTE — Therapy (Signed)
OUTPATIENT PHYSICAL THERAPY TREATMENT NOTE/RECERTIFICATION     Patient Name: Brent Griffith MRN: [id number] DOB:07-08-1935, 87 y.o., male Today's Date: 03/21/2022      PT End of Session - 03/27/22 1446     Visit Number 15    Date for PT Re-Evaluation 05/22/22    Authorization Type [employer] Medicare    PT Start Time 1446    PT Stop Time 1528    PT Time Calculation (min) 42 min    Activity Tolerance Patient tolerated treatment well                Past Medical History:  Diagnosis Date   Acute gastric ulcer without mention of hemorrhage, perforation, or obstruction    Allergic rhinitis, cause unspecified    Anemia, unspecified    Arthritis    Asthma    Blood transfusion without reported diagnosis    CAD (coronary artery disease)    Diaphragmatic hernia without mention of obstruction or gangrene    Elevated prostate specific antigen (PSA)    Enlarged prostate    Esophageal reflux    Extrinsic asthma, unspecified    Herpes zoster without mention of complication    Hyperlipidemia    Hypersomnia with sleep apnea, unspecified    Impotence of organic origin    Kyphosis    Lumbago    Obstructive sleep apnea (adult) (pediatric)    Pneumonia    hx of years ago    Senile osteoporosis    Trigger finger (acquired)    Tubular adenoma of colon 05/2013   Unspecified essential hypertension    Unspecified sleep apnea    CPAP- settings 4-12    Unspecified vitamin D deficiency    Past Surgical History:  Procedure Laterality Date   CARDIAC CATHETERIZATION  03/04/2009   Dr [name]   COLONOSCOPY     CORONARY ARTERY BYPASS GRAFT  2010   CYSTOSCOPY WITH RETROGRADE PYELOGRAM, URETEROSCOPY AND STENT PLACEMENT Right 10/04/2013   Procedure: CYSTOSCOPY WITH RETROGRADE PYELOGRAM,  AND STENT PLACEMENT;  Surgeon: Festus Aloe, MD;  Location: [facility];  Service: Urology;  Laterality: Right;   DENTAL SURGERY N/A    MOLE REMOVAL  1958   chin   ROTATOR CUFF REPAIR Left 04/1998    with bone spur removed, Dr French Ana   TONSILLECTOMY  1945   TRANSURETHRAL RESECTION OF PROSTATE N/A 09/24/2013   Procedure: TRANSURETHRAL RESECTION OF THE PROSTATE (TURP) WITH GYRUS (STAGED RIGHT LATERAL AND MEDIAN LOBE);  Surgeon: Ailene Rud, MD;  Location: [facility];  Service: Urology;  Laterality: N/A;   UPPER GI ENDOSCOPY     Patient Active Problem List   Diagnosis Date Noted   Encounter for therapeutic drug monitoring 10/25/2021   Atrial fibrillation ([place]) 10/25/2021   PVC's (premature ventricular contractions) 05/04/2021   COVID-19 virus infection 02/20/2021   Sinusitis 01/24/2021   New onset atrial fibrillation ([place]) 01/19/2021   AKI (acute kidney injury) ([place]) 01/19/2021   HFrEF (heart failure with reduced ejection fraction) ([place]) 01/19/2021   CAD (coronary artery disease) 10/11/2017   IDA (iron deficiency anemia) 08/31/2016   Seasonal and perennial allergic rhinitis 08/28/2016   Palpitations 09/07/2015   Cough, persistent 07/20/2015   Senile osteoporosis 06/09/2014   Shortness of breath 03/20/2014   Hydronephrosis of right kidney 10/03/2013   Benign prostatic hyperplasia 09/24/2013   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   Dyslipidemia 04/06/2009   CARDIOVASCULAR FUNCTION STUDY, ABNORMAL 03/03/2009   Obstructive sleep apnea 02/04/2009   Essential hypertension 09/02/2008   G  E R D 09/02/2008   PERIODIC LIMB MOVEMENT DISORDER 09/03/2007    PCP: Sherrie Mustache NP  REFERRING PROVIDER: Wandra Feinstein MD  REFERRING DIAG: thoracic spine kyphosis; cervical lordosis; osteoporosis  THERAPY DIAG:  postural dysfunction; neck pain   Rationale for Evaluation and Treatment Rehabilitation  ONSET DATE: 05/07/21  SUBJECTIVE:                                                                                                                                                                                                         SUBJECTIVE STATEMENT: Denies soreness after last visit.  Rates  overall progress at 60%.       PERTINENT HISTORY:  2 cracked vertebra in cervical spine a few years ago, no known injury Osteoporosis with 2 inch height loss  PAIN:  Are you having pain? yes NPRS scale:  neck soreness "a little" Pain location: lower cervical and between shoulder blades Aggravating factors: walking the dog, sitting at church; trouble getting up off the floor Relieving factors: take a Tylenol  PRECAUTIONS: Other: osteoporosis  WEIGHT BEARING RESTRICTIONS No  FALLS:  Has patient fallen in last 6 months? Yes. Number of falls tripped and did hit the floor  LIVING ENVIRONMENT: Lives with: lives with their spouse Lives in: House/apartment Stairs: Yes: External: 2 steps; on right going up   OCCUPATION:retired  PLOF: Independent with basic ADLs  PATIENT GOALS get my back straightened up  OBJECTIVE:    PATIENT SURVEYS:  12/06/2021:  FOTO 58%  9/27: 69%   11/21:  67% goal met taken out of FOTO   COGNITION: Overall cognitive status: Within functional limits for tasks assessed    POSTURE: forward head and increased thoracic kyphosis Measurements standing against the wall:   At rest: ear lobe to wall 21 cm, with effect 18.5 cm ear lobe to wall Shoulders to wall 12 cm  8/30: ear lobe to wall 22 cm  9/27: 17cm ear lobe  10/11: 20 cm ear lobe to wall  11/8:  21 cm ear lobe to wall   11/21:  18.5 cm ear lobe to wall   CERVICAL ROM:   Active ROM A/PROM (deg) eval 8/23 8/30 9/14 9/27 10/11 11/8 11/21  Flexion 55         Extension _0 38 25 32 25  Right lateral flexion _1 Left lateral flexion _2 Right rotation 40 30 35  40 32 50 40  Left rotation 50  35 40  45 43 55 45   (Blank rows = not tested)  UPPER EXTREMITY ROM: bil UE [place]  UPPER EXTREMITY MMT:  Eval:  grossly 4/5 UEs except middle and lower traps 4-/5  11/21:  UE grossly 4/5  FUNCTIONAL TESTS:  12/12/2021: 5 times sit to stand: 19.3  sec with UE use required 9/27:  20 sec no hands 11/21:  16.53  no hands     TODAY'S TREATMENT:   03/27/22: Supine on wedge with 1 folded pillow  behind head bench press cane with 10# weight x10;  10x rotate "steering wheel" Supine on wedge with 1 folded pillow  behind head 10# cane on hips with bridge 10x FOTO C ROM 5x sit to stand Posture measure Leg press 70# 10x; 35# single leg 10x 2 right/left UBE alternating FW and BW 4 min  Seated with ball behind back with 5# overhead dumbbell press 10x Seated with head against ball, holding 5# overhead hold with marching 30 sec each side Wall push ups 10x Wall climbs (same side UE/LE lifts for target on wall) 10x Seated isometric head retraction against ball on wall 5 sec hold 10x Back to wall snow angels 5x Back to wall bil UE elevation 5x seated UBE Forward/back with head up looking at the clock for 2x2 Nustep L3 old model 5 min  with VC to watch calendar Neuromuscular re-ed: activation of cervical extensors and scapular retractors Therapeutic activity: standing and sitting with head in neutral with walking the dog, mowing; encouraged elevated book height when reading in bed or with sitting      11/15: seated UBE Forward/back with head up looking at the clock for 2x2 Nustep L3 old model 5 min  with VC to watch calendar Supine on wedge with 1 folded pillow  behind head bench press cane with 10# weight x10;  10x rotate "steering wheel" Supine on wedge with 1 folded pillow  behind head 10# cane on hips with bridge 10x Seated with ball behind back with 5# overhead dumbbell press 10x Seated with head against ball, holding 5# overhead hold with marching 30 sec each side Wall push ups 10x Wall climbs (same side UE/LE lifts for target on wall) 10x Seated isometric head retraction against ball on wall 5 sec hold 10x Back to wall snow angels 5x Back to wall bil UE elevation 5x Neuromuscular re-ed: activation of cervical extensors and  scapular retractors Therapeutic activity: standing and sitting with head in neutral with walking the dog, mowing; encouraged elevated book height when reading in bed or with sitting     03/14/22: seated UBE Forward/back with head up looking at the clock for 2x2 Supine on wedge with 1 folded pillow  behind head bench press cane with 6# weight 2x10 Supine on wedge with 1 folded pillow  behind head holding  6# cane overhead with LE marching 10x Supine on wedge with 1 folded pillow  behind head 6# cane on hips with bridge 10x Supine on wedge with 1 folded pillow  behind head yellow band diagonal flexion 10x right/left Supine on wedge with 1 folded pillow  behind head green band diagonal extension 10x right/left  Leaning forward with hands on mat table with head lifts to find target 10x Wall climbs (same side UE/LE lifts for target on wall) 10x Seated isometric head retraction against ball on wall 5 sec hold 10x Seated with head against ball on wall: blue band rows, lat pull downs and bil shoulder extension 10x each  Seated thoracic extension over purple ball 10x Back to wall snow angels 20x Back to wall bil UE elevation 10x Nustep L3 old model 6 min to end sesison with VC to watch calendar Neuromuscular re-ed: activation of cervical extensors and scapular retractors Therapeutic activity: standing and sitting with head in neutral with walking the dog, mowing; encouraged elevated book height when reading in bed or with sitting   PATIENT EDUCATION:  Education details: initial HEP with green band Person educated: Patient Education method: Explanation, Demonstration, and Handouts Education comprehension: verbalized understanding and returned demonstration   HOME EXERCISE PROGRAM: Access Code: [facility] URL: [email or web address] Date: 12/06/2021 Prepared by: Ruben Im  Exercises - Standing Bilateral Low Shoulder Row with Anchored Resistance  - 1 x daily - 7 x weekly - 2  sets - 10 reps - Shoulder Extension with Resistance Hands Down  - 1 x daily - 7 x weekly - 2 sets - 10 reps - Seated Shoulder Horizontal Abduction with Resistance - Thumbs Up  - 1 x daily - 7 x weekly - 2 sets - 10 reps - Seated Thoracic Lumbar Extension with Pectoralis Stretch  - 1 x daily - 7 x weekly - 1 sets - 10 reps  ASSESSMENT:  CLINICAL IMPRESSION: Improved postural alignment ear to wall measurement, sit to stand timed test, functional outcome score and decreasing pain reports.  He self rates improvement at 60% and progressing with rehab goals.  The patient would benefit from a continuation of skilled PT for a further progression of strengthening and functional mobility.  Will continue to update and promote independence in a HEP needed for a return to the highest functional level possible with ADLs.   Will decrease treatment frequency to biweekly as patient plans on starting a gym program.      OBJECTIVE IMPAIRMENTS decreased ROM, decreased strength, impaired UE functional use, postural dysfunction, and pain.   ACTIVITY LIMITATIONS carrying, lifting, and reading, sitting at church  PARTICIPATION LIMITATIONS: driving, shopping, community activity, and church  PERSONAL FACTORS Age and Time since onset of injury/illness/exacerbation are also affecting patient's functional outcome.   REHAB POTENTIAL: Good  CLINICAL DECISION MAKING: Stable/uncomplicated  EVALUATION COMPLEXITY: Low   GOALS: Goals reviewed with patient? Yes  SHORT TERM GOALS: Target date: 01/03/2022   The patient will demonstrate knowledge of basic self care strategies and exercises to promote healing  (using lumbar roll, book prop) Baseline:  Goal status: goal met 8/30  2.  Improved postural alignment with ear lobe to wall measurement decreased to 17 cm Baseline: 22 cm Goal status:  ongoing  3.  Improved rotation ROM to 55 degrees bil needed for driving Baseline:  Goal status: ongoing  4.  The patient will  report lower cervical and periscapular pain level decreased by 25% Baseline:   Goal status: goal met  9/27 30%    LONG TERM GOALS: Target date: 05/22/2022  The patient will be independent in a safe self progression of a home exercise program to promote further recovery of function   Baseline:  Goal status: ongoing  2.  Cervical sidebending ROM improved to 30 degrees and extension to 25 degrees needed for driving Baseline:  Goal status: ongoing   3.  The patient will have improved standing postural alignment with ear lobe to wall measurement 15cm  Baseline:  Goal status: ongoing   4.  The patient will have improved FOTO score to    63%   indicating improved function with less pain  Baseline:  Goal  status: goal met 11/21  5.  Lower neck pain and interscapular pain decreased by 50% Baseline:  Goal status: goal met 11/21   6.  Able to start a regular gym program new     PLAN: PT FREQUENCY: biweekly  PT DURATION: 8 weeks  PLANNED INTERVENTIONS: Therapeutic exercises, Therapeutic activity, Neuromuscular re-education, Patient/Family education, Self Care, Joint mobilization, Aquatic Therapy, Dry Needling, Spinal mobilization, Cryotherapy, Moist heat, Taping, Traction, Ultrasound, Manual therapy, and Re-evaluation  PLAN FOR NEXT SESSION:     pt may go to the gym and want to do periodic follow biweekly;  leg press;  Nu-Step; UBE;   postural ex's and progress;  thoracic extension mobility; cervical ROM; manual therapy as needed  Ruben Im, PT 03/27/22 3:25 PM Phone: [phone] Fax: [phone]

## 2022-03-29 ENCOUNTER — Other Ambulatory Visit: Payer: Self-pay | Admitting: Cardiology

## 2022-03-29 DIAGNOSIS — I4891 Unspecified atrial fibrillation: Secondary | ICD-10-CM

## 2022-04-03 ENCOUNTER — Ambulatory Visit: Payer: Self-pay

## 2022-04-03 NOTE — Patient Outreach (Signed)
  Care Coordination   Initial Visit Note   04/03/2022 Name: Brent Griffith MRN: [id number] DOB: February 10, 1936  Brent Griffith is a 86 y.o. year old male who sees Eubanks, Brent American, NP for primary care. I spoke with  Brent Griffith by phone today.  What matters to the patients health and wellness today?  I want to learn more about what food I can eat while taking Warfarin.    Goals Addressed             This Visit's Progress    COMPLETED: Care Coordination Activities       Care Coordination Interventions: SDoH screening performed - identified challenges with food insecurity Education provided on the Brent Griffith app - patient and his spouse to download the free app to locate food pantry sites and community meals close to home Provided an application for Food and Nutrition Services advising to submit application to Griffith office Discussed the patient has had difficulty affording medications in the past but has recently switched from Eliquis to Warfarin for cost savings Education on Pharmacy Team advising the patient if he is prescribed a future medication that is costly to let his provider know so they can refer for patient assistance Determined the patient would like education on diet needs while taking Warfarin Scheduled telephone visit with Brent Griffith with Brent Griffith to advise of interventions and plan          SDOH assessments and interventions completed:  Yes  SDOH Interventions Today    Flowsheet Row Most Recent Value  SDOH Interventions   Food Insecurity Interventions Other (Comment)  [education on [place] food finder app, FNS application]  Housing Interventions Intervention Not Indicated  Transportation Interventions Intervention Not Indicated        Care Coordination Interventions:  Yes, provided   Follow up plan: Follow up call scheduled for 12/7 with [name]     Encounter Outcome:  Pt. Visit Completed   Brent Griffith, Brent Griffith, CDP Social Worker, Certified Dementia Practitioner Brent Griffith Care Management  Care Coordination [phone]

## 2022-04-03 NOTE — Patient Instructions (Signed)
Visit Information  Thank you for taking time to visit with me today. Please don't hesitate to contact me if I can be of assistance to you.   Following are the goals we discussed today:   Goals Addressed             This Visit's Progress    COMPLETED: Care Coordination Activities       Care Coordination Interventions: SDoH screening performed - identified challenges with food insecurity Education provided on the [place] app - patient and his spouse to download the free app to locate food pantry sites and community meals close to home Provided an application for Food and Nutrition Services advising to submit application to county office Discussed the patient has had difficulty affording medications in the past but has recently switched from Eliquis to Warfarin for cost savings Education on Pharmacy Team advising the patient if he is prescribed a future medication that is costly to let his provider know so they can refer for patient assistance Determined the patient would like education on diet needs while taking Warfarin Scheduled telephone visit with Lorane with [facility] to advise of interventions and plan          Your next appointment is by telephone on 12/7 at 1:30 with Glenard Haring [facility]  Please call the care guide team at [phone] if you need to cancel or reschedule your appointment.   If you are experiencing a Mental Health or [place] or need someone to talk to, please call 1-800-273-TALK (toll free, 24 hour hotline)  Patient verbalizes understanding of instructions and care plan provided today and agrees to view in [place]. Active MyChart status and patient understanding of how to access instructions and care plan via MyChart confirmed with patient.     Telephone follow up appointment with care management team member scheduled for:12/7  Daneen Schick, [place], CDP Social Worker,  Certified Dementia Practitioner [place] Coordination [phone]

## 2022-04-04 ENCOUNTER — Ambulatory Visit: Payer: Medicare Other | Admitting: Physical Therapy

## 2022-04-04 DIAGNOSIS — M4123 Other idiopathic scoliosis, cervicothoracic region: Secondary | ICD-10-CM

## 2022-04-04 DIAGNOSIS — M6281 Muscle weakness (generalized): Secondary | ICD-10-CM

## 2022-04-04 DIAGNOSIS — M542 Cervicalgia: Secondary | ICD-10-CM

## 2022-04-04 DIAGNOSIS — R293 Abnormal posture: Secondary | ICD-10-CM | POA: Diagnosis not present

## 2022-04-04 NOTE — Therapy (Signed)
OUTPATIENT PHYSICAL THERAPY TREATMENT NOTE     Patient Name: Brent Griffith MRN: [id number] DOB:1935/08/21, 86 y.o., male Today's Date: 03/21/2022      PT End of Session - 03/27/22 1446     Visit Number 15    Date for PT Re-Evaluation 05/22/22    Authorization Type [employer] Medicare    PT Start Time 1446    PT Stop Time 1528    PT Time Calculation (min) 42 min    Activity Tolerance Patient tolerated treatment well                Past Medical History:  Diagnosis Date   Acute gastric ulcer without mention of hemorrhage, perforation, or obstruction    Allergic rhinitis, cause unspecified    Anemia, unspecified    Arthritis    Asthma    Blood transfusion without reported diagnosis    CAD (coronary artery disease)    Diaphragmatic hernia without mention of obstruction or gangrene    Elevated prostate specific antigen (PSA)    Enlarged prostate    Esophageal reflux    Extrinsic asthma, unspecified    Herpes zoster without mention of complication    Hyperlipidemia    Hypersomnia with sleep apnea, unspecified    Impotence of organic origin    Kyphosis    Lumbago    Obstructive sleep apnea (adult) (pediatric)    Pneumonia    hx of years ago    Senile osteoporosis    Trigger finger (acquired)    Tubular adenoma of colon 05/2013   Unspecified essential hypertension    Unspecified sleep apnea    CPAP- settings 4-12    Unspecified vitamin D deficiency    Past Surgical History:  Procedure Laterality Date   CARDIAC CATHETERIZATION  03/04/2009   Dr [name]   COLONOSCOPY     CORONARY ARTERY BYPASS GRAFT  2010   CYSTOSCOPY WITH RETROGRADE PYELOGRAM, URETEROSCOPY AND STENT PLACEMENT Right 10/04/2013   Procedure: CYSTOSCOPY WITH RETROGRADE PYELOGRAM,  AND STENT PLACEMENT;  Surgeon: Festus Aloe, MD;  Location: [facility];  Service: Urology;  Laterality: Right;   DENTAL SURGERY N/A    MOLE REMOVAL  1958   chin   ROTATOR CUFF REPAIR Left 04/1998   with bone spur  removed, Dr French Ana   TONSILLECTOMY  1945   TRANSURETHRAL RESECTION OF PROSTATE N/A 09/24/2013   Procedure: TRANSURETHRAL RESECTION OF THE PROSTATE (TURP) WITH GYRUS (STAGED RIGHT LATERAL AND MEDIAN LOBE);  Surgeon: Ailene Rud, MD;  Location: [facility];  Service: Urology;  Laterality: N/A;   UPPER GI ENDOSCOPY     Patient Active Problem List   Diagnosis Date Noted   Encounter for therapeutic drug monitoring 10/25/2021   Atrial fibrillation ([place]) 10/25/2021   PVC's (premature ventricular contractions) 05/04/2021   COVID-19 virus infection 02/20/2021   Sinusitis 01/24/2021   New onset atrial fibrillation ([place]) 01/19/2021   AKI (acute kidney injury) ([place]) 01/19/2021   HFrEF (heart failure with reduced ejection fraction) ([place]) 01/19/2021   CAD (coronary artery disease) 10/11/2017   IDA (iron deficiency anemia) 08/31/2016   Seasonal and perennial allergic rhinitis 08/28/2016   Palpitations 09/07/2015   Cough, persistent 07/20/2015   Senile osteoporosis 06/09/2014   Shortness of breath 03/20/2014   Hydronephrosis of right kidney 10/03/2013   Benign prostatic hyperplasia 09/24/2013   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   Dyslipidemia 04/06/2009   CARDIOVASCULAR FUNCTION STUDY, ABNORMAL 03/03/2009   Obstructive sleep apnea 02/04/2009   Essential hypertension 09/02/2008   G  E R D 09/02/2008   PERIODIC LIMB MOVEMENT DISORDER 09/03/2007    PCP: Sherrie Mustache NP  REFERRING PROVIDER: Wandra Feinstein MD  REFERRING DIAG: thoracic spine kyphosis; cervical lordosis; osteoporosis  THERAPY DIAG:  postural dysfunction; neck pain   Rationale for Evaluation and Treatment Rehabilitation  ONSET DATE: 05/07/21  SUBJECTIVE:                                                                                                                                                                                                         SUBJECTIVE STATEMENT: Some neck soreness but not bad after last visit.      PERTINENT HISTORY:  2 cracked vertebra in cervical spine a few years ago, no known injury Osteoporosis with 2 inch height loss  PAIN:  Are you having pain? yes NPRS scale:  neck soreness "a little" Pain location: lower cervical and between shoulder blades Aggravating factors: walking the dog, sitting at church; trouble getting up off the floor Relieving factors: take a Tylenol  PRECAUTIONS: Other: osteoporosis  WEIGHT BEARING RESTRICTIONS No  FALLS:  Has patient fallen in last 6 months? Yes. Number of falls tripped and did hit the floor  LIVING ENVIRONMENT: Lives with: lives with their spouse Lives in: House/apartment Stairs: Yes: External: 2 steps; on right going up   OCCUPATION:retired  PLOF: Independent with basic ADLs  PATIENT GOALS get my back straightened up  OBJECTIVE:    PATIENT SURVEYS:  12/06/2021:  FOTO 58%  9/27: 69%   11/21:  67% goal met taken out of FOTO   COGNITION: Overall cognitive status: Within functional limits for tasks assessed    POSTURE: forward head and increased thoracic kyphosis Measurements standing against the wall:   At rest: ear lobe to wall 21 cm, with effect 18.5 cm ear lobe to wall Shoulders to wall 12 cm  8/30: ear lobe to wall 22 cm  9/27: 17cm ear lobe  10/11: 20 cm ear lobe to wall  11/8:  21 cm ear lobe to wall   11/21:  18.5 cm ear lobe to wall   CERVICAL ROM:   Active ROM A/PROM (deg) eval 8/23 8/30 9/14 9/27 10/11 11/8 11/21  Flexion 55         Extension _0 38 25 32 25  Right lateral flexion _1 Left lateral flexion _2 Right rotation 40 30 35  40 32 50 40  Left rotation 50 35 40  45  43 55 45   (Blank rows = not tested)  UPPER EXTREMITY ROM: bil UE Kirvin  UPPER EXTREMITY MMT:  Eval:  grossly 4/5 UEs except middle and lower traps 4-/5  11/21:  UE grossly 4/5  FUNCTIONAL TESTS:  12/12/2021: 5 times sit to stand: 19.3 sec with UE use  required 9/27:  20 sec no hands 11/21:  16.53  no hands     TODAY'S TREATMENT:  04/04/22: Nustep L3 old model 5 min  with VC to watch calendar Supine on wedge with 1 folded pillow  behind head bench press cane with 12# weight x10 Supine on wedge with 1 folded pillow  behind head 12# on cane on hips with bridge 10x Seated cable row 15# 2x10 Leg press 70# 15x; 35# single leg 15 right/left Seated lat pull downs with cable 15# 15x Back to wall: inhale with bil UE elevation 5x, snow angels 5x Wall climbs (same side UE/LE lifts for target on wall) 10x Seated isometric head retraction against ball on wall 5 sec hold 10x Seated head pressure on ball with cervical rotation 5x each way seated UBE Forward/back with head up looking at the clock for 2x2 Neuromuscular re-ed: activation of cervical extensors and scapular retractors Therapeutic activity: standing and sitting with head in neutral with walking the dog, mowing; encouraged elevated book height when reading in bed or with sitting      03/27/22: Supine on wedge with 1 folded pillow  behind head bench press cane with 10# weight x10;  10x rotate "steering wheel" Supine on wedge with 1 folded pillow  behind head 10# cane on hips with bridge 10x FOTO C ROM 5x sit to stand Posture measure Leg press 70# 10x; 35# single leg 10x 2 right/left UBE alternating FW and BW 4 min Seated with ball behind back with 5# overhead dumbbell press 10x Seated with head against ball, holding 5# overhead hold with marching 30 sec each side Wall push ups 10x Wall climbs (same side UE/LE lifts for target on wall) 10x Seated isometric head retraction against ball on wall 5 sec hold 10x Back to wall snow angels 5x Back to wall bil UE elevation 5x seated UBE Forward/back with head up looking at the clock for 2x2 Nustep L3 old model 5 min  with VC to watch calendar Neuromuscular re-ed: activation of cervical extensors and scapular retractors Therapeutic  activity: standing and sitting with head in neutral with walking the dog, mowing; encouraged elevated book height when reading in bed or with sitting  PATIENT EDUCATION:  Education details: initial HEP with green band Person educated: Patient Education method: Explanation, Demonstration, and Handouts Education comprehension: verbalized understanding and returned demonstration   HOME EXERCISE PROGRAM: Access Code: [facility] URL: [email or web address] Date: 12/06/2021 Prepared by: Ruben Im  Exercises - Standing Bilateral Low Shoulder Row with Anchored Resistance  - 1 x daily - 7 x weekly - 2 sets - 10 reps - Shoulder Extension with Resistance Hands Down  - 1 x daily - 7 x weekly - 2 sets - 10 reps - Seated Shoulder Horizontal Abduction with Resistance - Thumbs Up  - 1 x daily - 7 x weekly - 2 sets - 10 reps - Seated Thoracic Lumbar Extension with Pectoralis Stretch  - 1 x daily - 7 x weekly - 1 sets - 10 reps  ASSESSMENT:  CLINICAL IMPRESSION: Verbal cues for forward/upward gaze.  Endurance for holding head in neutral is still limited.  Patient is highly motivated with attempting more  challenging ex's.  Will continue to emphasize postural strengthening particularly posterior chain musculature.       OBJECTIVE IMPAIRMENTS decreased ROM, decreased strength, impaired UE functional use, postural dysfunction, and pain.   ACTIVITY LIMITATIONS carrying, lifting, and reading, sitting at church  PARTICIPATION LIMITATIONS: driving, shopping, community activity, and church  PERSONAL FACTORS Age and Time since onset of injury/illness/exacerbation are also affecting patient's functional outcome.   REHAB POTENTIAL: Good  CLINICAL DECISION MAKING: Stable/uncomplicated  EVALUATION COMPLEXITY: Low   GOALS: Goals reviewed with patient? Yes  SHORT TERM GOALS: Target date: 01/03/2022   The patient will demonstrate knowledge of basic self care strategies and exercises to  promote healing  (using lumbar roll, book prop) Baseline:  Goal status: goal met 8/30  2.  Improved postural alignment with ear lobe to wall measurement decreased to 17 cm Baseline: 22 cm Goal status:  ongoing  3.  Improved rotation ROM to 55 degrees bil needed for driving Baseline:  Goal status: ongoing  4.  The patient will report lower cervical and periscapular pain level decreased by 25% Baseline:   Goal status: goal met  9/27 30%    LONG TERM GOALS: Target date: 05/22/2022  The patient will be independent in a safe self progression of a home exercise program to promote further recovery of function   Baseline:  Goal status: ongoing  2.  Cervical sidebending ROM improved to 30 degrees and extension to 25 degrees needed for driving Baseline:  Goal status: ongoing   3.  The patient will have improved standing postural alignment with ear lobe to wall measurement 15cm  Baseline:  Goal status: ongoing   4.  The patient will have improved FOTO score to    63%   indicating improved function with less pain  Baseline:  Goal status: goal met 11/21  5.  Lower neck pain and interscapular pain decreased by 50% Baseline:  Goal status: goal met 11/21   6.  Able to start a regular gym program new     PLAN: PT FREQUENCY: biweekly  PT DURATION: 8 weeks  PLANNED INTERVENTIONS: Therapeutic exercises, Therapeutic activity, Neuromuscular re-education, Patient/Family education, Self Care, Joint mobilization, Aquatic Therapy, Dry Needling, Spinal mobilization, Cryotherapy, Moist heat, Taping, Traction, Ultrasound, Manual therapy, and Re-evaluation  PLAN FOR NEXT SESSION:     pt may go to the gym and want to do periodic follow biweekly;  leg press;  Nu-Step; UBE;   postural ex's and progress;  thoracic extension mobility; cervical ROM; manual therapy as needed  Ruben Im, PT 04/04/22 3:25 PM Phone: [phone] Fax: [phone]

## 2022-04-09 MED ORDER — AMIODARONE HCL 200 MG PO TABS: 200.0000 mg | ORAL_TABLET | Freq: Every day | ORAL | 3 refills | Status: DC

## 2022-04-12 ENCOUNTER — Ambulatory Visit: Payer: Self-pay

## 2022-04-13 NOTE — Patient Instructions (Signed)
Visit Information  Thank you for taking time to visit with me today. Please don't hesitate to contact me if I can be of assistance to you.   Following are the goals we discussed today:   Goals Addressed             This Visit's Progress    I would like to know what I can eat while taking Warfarin       Care Coordination Interventions: Evaluation of current treatment plan related to anticoagulation therapy and patient's adherence to plan as established by provider Review of patient status, including review of consultant's reports, relevant laboratory and other test results, and medications completed Educated patient regarding changes in the amount of vitamin K in the diet can alter the metabolism of warfarin (Coumadin) Educated patient to aim to keep the amount of vitamin K in her diet consistent Mailed printed educational materials related to Warfarin and Vitamin K           Our next appointment is by telephone on 05/10/22 at 1130 AM  Please call the care guide team at [phone] if you need to cancel or reschedule your appointment.   If you are experiencing a Mental Health or [place] or need someone to talk to, please call [phone]-TALK (toll free, 24 hour hotline)  Patient verbalizes understanding of instructions and care plan provided today and agrees to view in [place]. Active MyChart status and patient understanding of how to access instructions and care plan via MyChart confirmed with patient.     Barb Merino, RN, BSN, CCM Care Management Coordinator [facility] Care Management Direct Phone: [phone]

## 2022-04-13 NOTE — Patient Outreach (Signed)
  Care Coordination   Initial Visit Note   04/13/2022 Name: Brent Griffith MRN: [id number] DOB: Jan 06, 1936  Brent Griffith is a 86 y.o. year old male who sees Eubanks, Carlos American, NP for primary care. I spoke with  Brent Griffith by phone today.  What matters to the patients health and wellness today?  Patient experienced and elevated INR and would like to know what foods he can eat.     Goals Addressed             This Visit's Progress    I would like to know what I can eat while taking Warfarin       Care Coordination Interventions: Evaluation of current treatment plan related to anticoagulation therapy and patient's adherence to plan as established by provider Review of patient status, including review of consultant's reports, relevant laboratory and other test results, and medications completed Educated patient regarding changes in the amount of vitamin K in the diet can alter the metabolism of warfarin (Coumadin) Educated patient to aim to keep the amount of vitamin K in her diet consistent Mailed printed educational materials related to Warfarin and Vitamin K           SDOH assessments and interventions completed:  Yes  SDOH Interventions Today    Flowsheet Row Most Recent Value  SDOH Interventions   Transportation Interventions Intervention Not Indicated        Care Coordination Interventions:  Yes, provided   Follow up plan: Follow up call scheduled for 05/10/22 '@1130'$  AM    Encounter Outcome:  Pt. Visit Completed

## 2022-04-13 NOTE — Patient Outreach (Signed)
  Care Coordination   Follow Up Visit Note   04/13/2022 Name: Brent Griffith MRN: [id number] DOB: 03-Mar-1936  Brent Griffith is a 86 y.o. year old male who sees Eubanks, Carlos American, NP for primary care. I spoke with  [name] by phone today.  What matters to the patients health and wellness today?  Patient experienced an elevated INR. He would like to know what foods to eat while taking Warfarin.     Goals Addressed             This Visit's Progress    I would like to know what I can eat while taking Warfarin       Care Coordination Interventions: Evaluation of current treatment plan related to anticoagulation therapy and patient's adherence to plan as established by provider Review of patient status, including review of consultant's reports, relevant laboratory and other test results, and medications completed Educated patient regarding changes in the amount of vitamin K in the diet can alter the metabolism of warfarin (Coumadin) Educated patient to aim to keep the amount of vitamin K in her diet consistent Mailed printed educational materials related to Warfarin and Vitamin K           SDOH assessments and interventions completed:  Yes  SDOH Interventions Today    Flowsheet Row Most Recent Value  SDOH Interventions   Transportation Interventions Intervention Not Indicated        Care Coordination Interventions:  Yes, provided   Follow up plan: Follow up call scheduled for 05/10/22 '@1130'$  AM    Encounter Outcome:  Pt. Visit Completed

## 2022-04-16 ENCOUNTER — Ambulatory Visit: Payer: Medicare Other | Attending: Cardiology | Admitting: *Deleted

## 2022-04-16 ENCOUNTER — Other Ambulatory Visit: Payer: Self-pay | Admitting: *Deleted

## 2022-04-16 DIAGNOSIS — I4891 Unspecified atrial fibrillation: Secondary | ICD-10-CM

## 2022-04-16 DIAGNOSIS — Z5181 Encounter for therapeutic drug level monitoring: Secondary | ICD-10-CM | POA: Diagnosis not present

## 2022-04-16 LAB — POCT INR: INR: 3.5 — AB (ref 2.0–3.0)

## 2022-04-16 MED ORDER — EMPAGLIFLOZIN 10 MG PO TABS: 10.0000 mg | ORAL_TABLET | Freq: Every day | ORAL | 3 refills | Status: DC

## 2022-04-16 NOTE — Patient Instructions (Addendum)
Description   USE WARFARIN 2.'5mg'$  tablets ([place] green color). Hold tomorrow's dose (already taken today's dose) and then start taking Warfarin 1/2 tablet daily except for 1 tablet on Monday and Friday. Recheck INR in 3 weeks.  Pt drink's boost daily. [facility] [phone]

## 2022-04-18 ENCOUNTER — Ambulatory Visit: Payer: Medicare Other | Attending: Sports Medicine | Admitting: Rehabilitative and Restorative Service Providers"

## 2022-04-18 ENCOUNTER — Encounter: Payer: Self-pay | Admitting: Rehabilitative and Restorative Service Providers"

## 2022-04-18 DIAGNOSIS — M6281 Muscle weakness (generalized): Secondary | ICD-10-CM | POA: Diagnosis not present

## 2022-04-18 DIAGNOSIS — M4123 Other idiopathic scoliosis, cervicothoracic region: Secondary | ICD-10-CM

## 2022-04-18 DIAGNOSIS — M542 Cervicalgia: Secondary | ICD-10-CM | POA: Diagnosis not present

## 2022-04-18 DIAGNOSIS — R293 Abnormal posture: Secondary | ICD-10-CM | POA: Diagnosis not present

## 2022-04-18 NOTE — Therapy (Signed)
OUTPATIENT PHYSICAL THERAPY TREATMENT NOTE     Patient Name: Brent Griffith MRN: [id number] DOB:April 27, 1936, 86 y.o., male Today's Date: 04/18/2022      PT End of Session - 04/18/22 1226     Visit Number 64    Date for PT Re-Evaluation 05/22/22    Authorization Type [employer] Medicare    Progress Note Due on Visit 26    PT Start Time 1224    PT Stop Time 1302    PT Time Calculation (min) 38 min    Activity Tolerance Patient tolerated treatment well    Behavior During Therapy WFL for tasks assessed/performed                Past Medical History:  Diagnosis Date   Acute gastric ulcer without mention of hemorrhage, perforation, or obstruction    Allergic rhinitis, cause unspecified    Anemia, unspecified    Arthritis    Asthma    Blood transfusion without reported diagnosis    CAD (coronary artery disease)    Diaphragmatic hernia without mention of obstruction or gangrene    Elevated prostate specific antigen (PSA)    Enlarged prostate    Esophageal reflux    Extrinsic asthma, unspecified    Herpes zoster without mention of complication    Hyperlipidemia    Hypersomnia with sleep apnea, unspecified    Impotence of organic origin    Kyphosis    Lumbago    Obstructive sleep apnea (adult) (pediatric)    Pneumonia    hx of years ago    Senile osteoporosis    Trigger finger (acquired)    Tubular adenoma of colon 05/2013   Unspecified essential hypertension    Unspecified sleep apnea    CPAP- settings 4-12    Unspecified vitamin D deficiency    Past Surgical History:  Procedure Laterality Date   CARDIAC CATHETERIZATION  03/04/2009   Dr [name]   COLONOSCOPY     CORONARY ARTERY BYPASS GRAFT  2010   CYSTOSCOPY WITH RETROGRADE PYELOGRAM, URETEROSCOPY AND STENT PLACEMENT Right 10/04/2013   Procedure: CYSTOSCOPY WITH RETROGRADE PYELOGRAM,  AND STENT PLACEMENT;  Surgeon: Festus Aloe, MD;  Location: [facility];  Service: Urology;  Laterality: Right;   DENTAL  SURGERY N/A    MOLE REMOVAL  1958   chin   ROTATOR CUFF REPAIR Left 04/1998   with bone spur removed, Dr French Ana   TONSILLECTOMY  1945   TRANSURETHRAL RESECTION OF PROSTATE N/A 09/24/2013   Procedure: TRANSURETHRAL RESECTION OF THE PROSTATE (TURP) WITH GYRUS (STAGED RIGHT LATERAL AND MEDIAN LOBE);  Surgeon: Ailene Rud, MD;  Location: [facility];  Service: Urology;  Laterality: N/A;   UPPER GI ENDOSCOPY     Patient Active Problem List   Diagnosis Date Noted   Encounter for therapeutic drug monitoring 10/25/2021   Atrial fibrillation ([place]) 10/25/2021   PVC's (premature ventricular contractions) 05/04/2021   COVID-19 virus infection 02/20/2021   Sinusitis 01/24/2021   New onset atrial fibrillation ([place]) 01/19/2021   AKI (acute kidney injury) ([place]) 01/19/2021   HFrEF (heart failure with reduced ejection fraction) ([place]) 01/19/2021   CAD (coronary artery disease) 10/11/2017   IDA (iron deficiency anemia) 08/31/2016   Seasonal and perennial allergic rhinitis 08/28/2016   Palpitations 09/07/2015   Cough, persistent 07/20/2015   Senile osteoporosis 06/09/2014   Shortness of breath 03/20/2014   Hydronephrosis of right kidney 10/03/2013   Benign prostatic hyperplasia 09/24/2013   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   Dyslipidemia 04/06/2009  CARDIOVASCULAR FUNCTION STUDY, ABNORMAL 03/03/2009   Obstructive sleep apnea 02/04/2009   Essential hypertension 09/02/2008   G E R D 09/02/2008   PERIODIC LIMB MOVEMENT DISORDER 09/03/2007    PCP: Sherrie Mustache NP  REFERRING PROVIDER: Wandra Feinstein MD  REFERRING DIAG: thoracic spine kyphosis; cervical lordosis; osteoporosis  THERAPY DIAG:  postural dysfunction; neck pain   Rationale for Evaluation and Treatment Rehabilitation  ONSET DATE: 05/07/21  SUBJECTIVE:                                                                                                                                                                                                          SUBJECTIVE STATEMENT: Pt reports no new complaints.  PERTINENT HISTORY:  2 cracked vertebra in cervical spine a few years ago, no known injury Osteoporosis with 2 inch height loss  PAIN:  Are you having pain? 0/10 NPRS scale:  neck soreness "a little" Pain location: lower cervical and between shoulder blades Aggravating factors: walking the dog, sitting at church; trouble getting up off the floor Relieving factors: take a Tylenol  PRECAUTIONS: Other: osteoporosis  WEIGHT BEARING RESTRICTIONS No  FALLS:  Has patient fallen in last 6 months? Yes. Number of falls tripped and did hit the floor  LIVING ENVIRONMENT: Lives with: lives with their spouse Lives in: House/apartment Stairs: Yes: External: 2 steps; on right going up   OCCUPATION:retired  PLOF: Independent with basic ADLs  PATIENT GOALS get my back straightened up  OBJECTIVE:    PATIENT SURVEYS:  12/06/2021:  FOTO 58%  9/27: 69%   11/21:  67% goal met taken out of FOTO   COGNITION: Overall cognitive status: Within functional limits for tasks assessed    POSTURE: forward head and increased thoracic kyphosis Measurements standing against the wall:   At rest: ear lobe to wall 21 cm, with effect 18.5 cm ear lobe to wall Shoulders to wall 12 cm  8/30: ear lobe to wall 22 cm  9/27: 17cm ear lobe  10/11: 20 cm ear lobe to wall  11/8:  21 cm ear lobe to wall   11/21:  18.5 cm ear lobe to wall   CERVICAL ROM:   Active ROM A/PROM (deg) eval 8/23 8/30 9/14 9/27 10/11 11/8 11/21  Flexion 55         Extension _0 38 25 32 25  Right lateral flexion _1 Left lateral flexion _2 Right rotation 40 30 35  40 32 50 40  Left rotation 50 35 40  45 43 55 45   (Blank rows = not tested)  UPPER EXTREMITY ROM: bil UE WFLS  UPPER EXTREMITY MMT:  Eval:  grossly 4/5 UEs except middle and lower traps 4-/5  11/21:  UE grossly 4/5  FUNCTIONAL TESTS:   12/12/2021: 5 times sit to stand: 19.3 sec with UE use required 9/27:  20 sec no hands 11/21:  16.53  no hands   04/18/2022: 5 times sit to stand: 11.2 sec with use of UE and legs against back of mat    TODAY'S TREATMENT:   04/18/2022: Nustep level 4 (old model) x6 min with PT present to discuss status and encourage pt to look ahead instead of down Sit to/from stand 2x5 Ambulation down PT gym hallway and back holding 2# dumbbells in each hand, seated recovery period, then another lap holding 7# in each hand Seated shoulder ER and horizontal abduction with green tband 2x10 (cuing for upright posture) Seated shoulder D2 with green tband 2x10 bilat Seated isometric head retraction against ball on wall 5 sec hold 10x Seated head pressure on ball with cervical rotation 5x each way Seated thoracic/cervical extension with back against ball 2x10 Standing cable rows 15# 2x10 Standing lat pulls 25# 2x10  04/04/22: Nustep L3 old model 5 min  with VC to watch calendar Supine on wedge with 1 folded pillow  behind head bench press cane with 12# weight x10 Supine on wedge with 1 folded pillow  behind head 12# on cane on hips with bridge 10x Seated cable row 15# 2x10 Leg press 70# 15x; 35# single leg 15 right/left Seated lat pull downs with cable 15# 15x Back to wall: inhale with bil UE elevation 5x, snow angels 5x Wall climbs (same side UE/LE lifts for target on wall) 10x Seated isometric head retraction against ball on wall 5 sec hold 10x Seated head pressure on ball with cervical rotation 5x each way seated UBE Forward/back with head up looking at the clock for 2x2 Neuromuscular re-ed: activation of cervical extensors and scapular retractors Therapeutic activity: standing and sitting with head in neutral with walking the dog, mowing; encouraged elevated book height when reading in bed or with sitting   03/27/22: Supine on wedge with 1 folded pillow  behind head bench press cane with 10#  weight x10;  10x rotate "steering wheel" Supine on wedge with 1 folded pillow  behind head 10# cane on hips with bridge 10x FOTO C ROM 5x sit to stand Posture measure Leg press 70# 10x; 35# single leg 10x 2 right/left UBE alternating FW and BW 4 min Seated with ball behind back with 5# overhead dumbbell press 10x Seated with head against ball, holding 5# overhead hold with marching 30 sec each side Wall push ups 10x Wall climbs (same side UE/LE lifts for target on wall) 10x Seated isometric head retraction against ball on wall 5 sec hold 10x Back to wall snow angels 5x Back to wall bil UE elevation 5x seated UBE Forward/back with head up looking at the clock for 2x2 Nustep L3 old model 5 min  with VC to watch calendar Neuromuscular re-ed: activation of cervical extensors and scapular retractors Therapeutic activity: standing and sitting with head in neutral with walking the dog, mowing; encouraged elevated book height when reading in bed or with sitting  PATIENT EDUCATION:  Education details: initial HEP with green band Person educated: Patient Education method: Explanation, Demonstration, and Handouts Education comprehension: verbalized  understanding and returned demonstration   HOME EXERCISE PROGRAM: Access Code: [facility] URL: [email or web address] Date: 12/06/2021 Prepared by: Ruben Im  Exercises - Standing Bilateral Low Shoulder Row with Anchored Resistance  - 1 x daily - 7 x weekly - 2 sets - 10 reps - Shoulder Extension with Resistance Hands Down  - 1 x daily - 7 x weekly - 2 sets - 10 reps - Seated Shoulder Horizontal Abduction with Resistance - Thumbs Up  - 1 x daily - 7 x weekly - 2 sets - 10 reps - Seated Thoracic Lumbar Extension with Pectoralis Stretch  - 1 x daily - 7 x weekly - 1 sets - 10 reps  ASSESSMENT:  CLINICAL IMPRESSION: Brent Griffith presents to skilled PT reporting that he has not been able to exercise in the gym, but he states that he is  going to try to start going.  Patient continues to require cuing for improved posture.  Patient educated about free [facility] exercise fitness class through [facility] Rec [facility] and provided with schedule/address to reach out, if interested.  Patient continues to require skilled PT to progress towards goal related activities and improved posture.     OBJECTIVE IMPAIRMENTS decreased ROM, decreased strength, impaired UE functional use, postural dysfunction, and pain.   ACTIVITY LIMITATIONS carrying, lifting, and reading, sitting at church  PARTICIPATION LIMITATIONS: driving, shopping, community activity, and church  PERSONAL FACTORS Age and Time since onset of injury/illness/exacerbation are also affecting patient's functional outcome.   REHAB POTENTIAL: Good  CLINICAL DECISION MAKING: Stable/uncomplicated  EVALUATION COMPLEXITY: Low   GOALS: Goals reviewed with patient? Yes  SHORT TERM GOALS: Target date: 01/03/2022   The patient will demonstrate knowledge of basic self care strategies and exercises to promote healing  (using lumbar roll, book prop) Baseline:  Goal status: goal met 8/30  2.  Improved postural alignment with ear lobe to wall measurement decreased to 17 cm Baseline: 22 cm Goal status:  ongoing  3.  Improved rotation ROM to 55 degrees bil needed for driving Baseline:  Goal status: ongoing  4.  The patient will report lower cervical and periscapular pain level decreased by 25% Baseline:   Goal status: goal met  9/27 30%    LONG TERM GOALS: Target date: 05/22/2022  The patient will be independent in a safe self progression of a home exercise program to promote further recovery of function   Baseline:  Goal status: ongoing  2.  Cervical sidebending ROM improved to 30 degrees and extension to 25 degrees needed for driving Baseline:  Goal status: ongoing   3.  The patient will have improved standing postural alignment with ear lobe to wall measurement 15cm   Baseline:  Goal status: ongoing   4.  The patient will have improved FOTO score to    63%   indicating improved function with less pain  Baseline:  Goal status: goal met 11/21  5.  Lower neck pain and interscapular pain decreased by 50% Baseline:  Goal status: goal met 11/21   6.  Able to start a regular gym program new     PLAN: PT FREQUENCY: biweekly  PT DURATION: 8 weeks  PLANNED INTERVENTIONS: Therapeutic exercises, Therapeutic activity, Neuromuscular re-education, Patient/Family education, Self Care, Joint mobilization, Aquatic Therapy, Dry Needling, Spinal mobilization, Cryotherapy, Moist heat, Taping, Traction, Ultrasound, Manual therapy, and Re-evaluation  PLAN FOR NEXT SESSION:     pt may go to the gym and want to do periodic follow biweekly;  leg press;  Nu-Step;  UBE;   postural ex's and progress;  thoracic extension mobility; cervical ROM; manual therapy as needed    Juel Burrow, PT 04/18/22 1:22 PM   [facility] [address], [place], [place] [address] Phone # [phone] Fax [phone]

## 2022-04-21 ENCOUNTER — Other Ambulatory Visit: Payer: Self-pay | Admitting: Nurse Practitioner

## 2022-04-21 ENCOUNTER — Other Ambulatory Visit: Payer: Self-pay | Admitting: Cardiology

## 2022-04-21 ENCOUNTER — Other Ambulatory Visit: Payer: Self-pay | Admitting: Physician Assistant

## 2022-04-21 DIAGNOSIS — K219 Gastro-esophageal reflux disease without esophagitis: Secondary | ICD-10-CM

## 2022-04-21 DIAGNOSIS — I255 Ischemic cardiomyopathy: Secondary | ICD-10-CM

## 2022-04-23 ENCOUNTER — Encounter: Payer: Self-pay | Admitting: *Deleted

## 2022-04-26 ENCOUNTER — Ambulatory Visit: Payer: Medicare Other | Admitting: Physical Therapy

## 2022-04-26 DIAGNOSIS — M542 Cervicalgia: Secondary | ICD-10-CM | POA: Diagnosis not present

## 2022-04-26 DIAGNOSIS — R293 Abnormal posture: Secondary | ICD-10-CM

## 2022-04-26 DIAGNOSIS — M6281 Muscle weakness (generalized): Secondary | ICD-10-CM | POA: Diagnosis not present

## 2022-04-26 DIAGNOSIS — M4123 Other idiopathic scoliosis, cervicothoracic region: Secondary | ICD-10-CM

## 2022-04-26 NOTE — Therapy (Signed)
OUTPATIENT PHYSICAL THERAPY TREATMENT NOTE     Patient Name: Brent Griffith MRN: [id number] DOB:1935-08-10, 86 y.o., male Today's Date: 04/26/2022      PT End of Session - 04/26/22 1400     Visit Number 18    Date for PT Re-Evaluation 05/22/22    Authorization Type [employer] Medicare    Progress Note Due on Visit 20    PT Start Time 1400    PT Stop Time 1440    PT Time Calculation (min) 40 min    Activity Tolerance Patient tolerated treatment well                Past Medical History:  Diagnosis Date   Acute gastric ulcer without mention of hemorrhage, perforation, or obstruction    Allergic rhinitis, cause unspecified    Anemia, unspecified    Arthritis    Asthma    Blood transfusion without reported diagnosis    CAD (coronary artery disease)    Diaphragmatic hernia without mention of obstruction or gangrene    Elevated prostate specific antigen (PSA)    Enlarged prostate    Esophageal reflux    Extrinsic asthma, unspecified    Herpes zoster without mention of complication    Hyperlipidemia    Hypersomnia with sleep apnea, unspecified    Impotence of organic origin    Kyphosis    Lumbago    Obstructive sleep apnea (adult) (pediatric)    Pneumonia    hx of years ago    Senile osteoporosis    Trigger finger (acquired)    Tubular adenoma of colon 05/2013   Unspecified essential hypertension    Unspecified sleep apnea    CPAP- settings 4-12    Unspecified vitamin D deficiency    Past Surgical History:  Procedure Laterality Date   CARDIAC CATHETERIZATION  03/04/2009   Dr [name]   COLONOSCOPY     CORONARY ARTERY BYPASS GRAFT  2010   CYSTOSCOPY WITH RETROGRADE PYELOGRAM, URETEROSCOPY AND STENT PLACEMENT Right 10/04/2013   Procedure: CYSTOSCOPY WITH RETROGRADE PYELOGRAM,  AND STENT PLACEMENT;  Surgeon: Festus Aloe, MD;  Location: [facility];  Service: Urology;  Laterality: Right;   DENTAL SURGERY N/A    MOLE REMOVAL  1958   chin   ROTATOR CUFF REPAIR  Left 04/1998   with bone spur removed, Dr French Ana   TONSILLECTOMY  1945   TRANSURETHRAL RESECTION OF PROSTATE N/A 09/24/2013   Procedure: TRANSURETHRAL RESECTION OF THE PROSTATE (TURP) WITH GYRUS (STAGED RIGHT LATERAL AND MEDIAN LOBE);  Surgeon: Ailene Rud, MD;  Location: [facility];  Service: Urology;  Laterality: N/A;   UPPER GI ENDOSCOPY     Patient Active Problem List   Diagnosis Date Noted   Encounter for therapeutic drug monitoring 10/25/2021   Atrial fibrillation ([place]) 10/25/2021   PVC's (premature ventricular contractions) 05/04/2021   COVID-19 virus infection 02/20/2021   Sinusitis 01/24/2021   New onset atrial fibrillation ([place]) 01/19/2021   AKI (acute kidney injury) ([place]) 01/19/2021   HFrEF (heart failure with reduced ejection fraction) ([place]) 01/19/2021   CAD (coronary artery disease) 10/11/2017   IDA (iron deficiency anemia) 08/31/2016   Seasonal and perennial allergic rhinitis 08/28/2016   Palpitations 09/07/2015   Cough, persistent 07/20/2015   Senile osteoporosis 06/09/2014   Shortness of breath 03/20/2014   Hydronephrosis of right kidney 10/03/2013   Benign prostatic hyperplasia 09/24/2013   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   Dyslipidemia 04/06/2009   CARDIOVASCULAR FUNCTION STUDY, ABNORMAL 03/03/2009   Obstructive sleep apnea  02/04/2009   Essential hypertension 09/02/2008   G E R D 09/02/2008   PERIODIC LIMB MOVEMENT DISORDER 09/03/2007    PCP: Sherrie Mustache NP  REFERRING PROVIDER: Wandra Feinstein MD  REFERRING DIAG: thoracic spine kyphosis; cervical lordosis; osteoporosis  THERAPY DIAG:  postural dysfunction; neck pain   Rationale for Evaluation and Treatment Rehabilitation  ONSET DATE: 05/07/21  SUBJECTIVE:                                                                                                                                                                                                         SUBJECTIVE STATEMENT: No complaints.  Reports  he plans to check at [place] for a posture support/brace.     PERTINENT HISTORY:  2 cracked vertebra in cervical spine a few years ago, no known injury Osteoporosis with 2 inch height loss  PAIN:  Are you having pain? 0/10 NPRS scale:  neck soreness "a little" Pain location: lower cervical and between shoulder blades Aggravating factors: walking the dog, sitting at church; trouble getting up off the floor Relieving factors: take a Tylenol  PRECAUTIONS: Other: osteoporosis  WEIGHT BEARING RESTRICTIONS No  FALLS:  Has patient fallen in last 6 months? Yes. Number of falls tripped and did hit the floor  LIVING ENVIRONMENT: Lives with: lives with their spouse Lives in: House/apartment Stairs: Yes: External: 2 steps; on right going up   OCCUPATION:retired  PLOF: Independent with basic ADLs  PATIENT GOALS get my back straightened up  OBJECTIVE:    PATIENT SURVEYS:  12/06/2021:  FOTO 58%  9/27: 69%   11/21:  67% goal met taken out of FOTO   COGNITION: Overall cognitive status: Within functional limits for tasks assessed    POSTURE: forward head and increased thoracic kyphosis Measurements standing against the wall:   At rest: ear lobe to wall 21 cm, with effect 18.5 cm ear lobe to wall Shoulders to wall 12 cm  8/30: ear lobe to wall 22 cm  9/27: 17cm ear lobe  10/11: 20 cm ear lobe to wall  11/8:  21 cm ear lobe to wall   11/21:  18.5 cm ear lobe to wall   CERVICAL ROM:   Active ROM A/PROM (deg) eval 8/23 8/30 9/14 9/27 10/11 11/8 11/21  Flexion 55         Extension _0 38 25 32 25  Right lateral flexion _1 Left lateral flexion _2 Right rotation 40  30 35  40 32 50 40  Left rotation 50 35 40  45 43 55 45   (Blank rows = not tested)  UPPER EXTREMITY ROM: bil UE WFLS  UPPER EXTREMITY MMT:  Eval:  grossly 4/5 UEs except middle and lower traps 4-/5  11/21:  UE grossly 4/5  FUNCTIONAL  TESTS:  12/12/2021: 5 times sit to stand: 19.3 sec with UE use required 9/27:  20 sec no hands 11/21:  16.53  no hands   04/18/2022: 5 times sit to stand: 11.2 sec with use of UE and legs against back of mat    TODAY'S TREATMENT:  04/26/22: Nustep L5 new model 6 min  while discussing status  Seated red band: horizontal abduction, diagonals, Ws each way 10x each Counter top push ups 10x Facing wall sliding 2# weights up wall alternating10x Seated cable row 15# 2x10 Leg press 75# 15x; 35# single leg 15 right/left Seated lat pull downs with cable 15# 15x Back to wall: inhale with bil UE elevation 5x, snow angels 5x Ambulation  holding 8# dumbbells in each hand 2 laps  Seated neck extension against yellow band 15x Leaning on hands on low mat table with head lift 15x 5 sec holds Facing wall chest on green ball: W squeeze with head lift 15x Height measurement on wall: 63.5 inches  Neuromuscular re-ed: activation of cervical extensors and scapular retractors Therapeutic activity: standing and sitting with head in neutral with walking the dog, mowing; encouraged elevated book height when reading in bed or with sitting    04/18/2022: Nustep level 4 (old model) x6 min with PT present to discuss status and encourage pt to look ahead instead of down Sit to/from stand 2x5 Ambulation down PT gym hallway and back holding 2# dumbbells in each hand, seated recovery period, then another lap holding 7# in each hand Seated shoulder ER and horizontal abduction with green tband 2x10 (cuing for upright posture) Seated shoulder D2 with green tband 2x10 bilat Seated isometric head retraction against ball on wall 5 sec hold 10x Seated head pressure on ball with cervical rotation 5x each way Seated thoracic/cervical extension with back against ball 2x10 Standing cable rows 15# 2x10 Standing lat pulls 25# 2x10   PATIENT EDUCATION:  Education details: initial HEP with green band Person educated:  Patient Education method: Explanation, Demonstration, and Handouts Education comprehension: verbalized understanding and returned demonstration   HOME EXERCISE PROGRAM: Access Code: [facility] URL: [email or web address] Date: 12/06/2021 Prepared by: Ruben Im  Exercises - Standing Bilateral Low Shoulder Row with Anchored Resistance  - 1 x daily - 7 x weekly - 2 sets - 10 reps - Shoulder Extension with Resistance Hands Down  - 1 x daily - 7 x weekly - 2 sets - 10 reps - Seated Shoulder Horizontal Abduction with Resistance - Thumbs Up  - 1 x daily - 7 x weekly - 2 sets - 10 reps - Seated Thoracic Lumbar Extension with Pectoralis Stretch  - 1 x daily - 7 x weekly - 1 sets - 10 reps  ASSESSMENT:  CLINICAL IMPRESSION: Patient has improved postural awareness with fewer cues needed for head/gaze forward during exercises.  Neck and upper back discomfort is low level throughout session.  Therapist monitoring response to all interventions and modifying treatment accordingly.    Patient is pleased with height measurement stating it is improved since last measured a the doctor's office.        OBJECTIVE IMPAIRMENTS decreased ROM, decreased strength, impaired UE functional use,  postural dysfunction, and pain.   ACTIVITY LIMITATIONS carrying, lifting, and reading, sitting at church  PARTICIPATION LIMITATIONS: driving, shopping, community activity, and church  PERSONAL FACTORS Age and Time since onset of injury/illness/exacerbation are also affecting patient's functional outcome.   REHAB POTENTIAL: Good  CLINICAL DECISION MAKING: Stable/uncomplicated  EVALUATION COMPLEXITY: Low   GOALS: Goals reviewed with patient? Yes  SHORT TERM GOALS: Target date: 01/03/2022   The patient will demonstrate knowledge of basic self care strategies and exercises to promote healing  (using lumbar roll, book prop) Baseline:  Goal status: goal met 8/30  2.  Improved postural alignment  with ear lobe to wall measurement decreased to 17 cm Baseline: 22 cm Goal status:  ongoing  3.  Improved rotation ROM to 55 degrees bil needed for driving Baseline:  Goal status: ongoing  4.  The patient will report lower cervical and periscapular pain level decreased by 25% Baseline:   Goal status: goal met  9/27 30%    LONG TERM GOALS: Target date: 05/22/2022  The patient will be independent in a safe self progression of a home exercise program to promote further recovery of function   Baseline:  Goal status: ongoing  2.  Cervical sidebending ROM improved to 30 degrees and extension to 25 degrees needed for driving Baseline:  Goal status: ongoing   3.  The patient will have improved standing postural alignment with ear lobe to wall measurement 15cm  Baseline:  Goal status: ongoing   4.  The patient will have improved FOTO score to    63%   indicating improved function with less pain  Baseline:  Goal status: goal met 11/21  5.  Lower neck pain and interscapular pain decreased by 50% Baseline:  Goal status: goal met 11/21   6.  Able to start a regular gym program new     PLAN: PT FREQUENCY: biweekly  PT DURATION: 8 weeks  PLANNED INTERVENTIONS: Therapeutic exercises, Therapeutic activity, Neuromuscular re-education, Patient/Family education, Self Care, Joint mobilization, Aquatic Therapy, Dry Needling, Spinal mobilization, Cryotherapy, Moist heat, Taping, Traction, Ultrasound, Manual therapy, and Re-evaluation  PLAN FOR NEXT SESSION:     pt may go to the gym and want to do periodic follow biweekly;  leg press;  Nu-Step; UBE;   postural ex's and progress;  thoracic extension mobility; cervical ROM; manual therapy as needed  Ruben Im, PT 04/26/22 5:11 PM Phone: [phone] Fax: [facility] [place], [address], [place] [address] Phone # [phone] Fax [phone]

## 2022-04-28 ENCOUNTER — Other Ambulatory Visit: Payer: Self-pay | Admitting: Cardiology

## 2022-05-03 ENCOUNTER — Other Ambulatory Visit: Payer: Self-pay | Admitting: *Deleted

## 2022-05-03 DIAGNOSIS — I4891 Unspecified atrial fibrillation: Secondary | ICD-10-CM

## 2022-05-03 MED ORDER — WARFARIN SODIUM 2.5 MG PO TABS: ORAL_TABLET | ORAL | 0 refills | Status: DC

## 2022-05-03 NOTE — Telephone Encounter (Signed)
Prescription refill request received for warfarin Lov: 02/07/2022 Next INR check: 05/10/2022 Warfarin tablet strength: 2.'5mg'$    Called pt who stated that he would like refill of warfarin [facility]. He has about a week left and will let us know if he does not get the shipment before he runs out next week.

## 2022-05-09 ENCOUNTER — Telehealth: Payer: Self-pay | Admitting: Physician Assistant

## 2022-05-09 ENCOUNTER — Telehealth: Payer: Self-pay | Admitting: *Deleted

## 2022-05-09 ENCOUNTER — Ambulatory Visit: Payer: Medicare Other | Attending: Sports Medicine | Admitting: Physical Therapy

## 2022-05-09 DIAGNOSIS — M6281 Muscle weakness (generalized): Secondary | ICD-10-CM | POA: Diagnosis not present

## 2022-05-09 DIAGNOSIS — M4123 Other idiopathic scoliosis, cervicothoracic region: Secondary | ICD-10-CM | POA: Insufficient documentation

## 2022-05-09 DIAGNOSIS — R293 Abnormal posture: Secondary | ICD-10-CM | POA: Diagnosis not present

## 2022-05-09 DIAGNOSIS — M542 Cervicalgia: Secondary | ICD-10-CM | POA: Diagnosis not present

## 2022-05-09 NOTE — Therapy (Addendum)
OUTPATIENT PHYSICAL THERAPY TREATMENT NOTE AND LATE ENTRY DISCHARGE SUMMARY     Patient Name: Brent Griffith MRN: [id number] DOB:1935-06-29, 87 y.o., male Today's Date: 05/09/2022      PT End of Session - 05/09/22 1450     Visit Number 19    Date for PT Re-Evaluation 05/22/22    Authorization Type [employer] Medicare    Progress Note Due on Visit 20    PT Start Time 1446    PT Stop Time 1525    PT Time Calculation (min) 39 min    Activity Tolerance Patient tolerated treatment well                Past Medical History:  Diagnosis Date   Acute gastric ulcer without mention of hemorrhage, perforation, or obstruction    Allergic rhinitis, cause unspecified    Anemia, unspecified    Arthritis    Asthma    Blood transfusion without reported diagnosis    CAD (coronary artery disease)    Diaphragmatic hernia without mention of obstruction or gangrene    Elevated prostate specific antigen (PSA)    Enlarged prostate    Esophageal reflux    Extrinsic asthma, unspecified    Herpes zoster without mention of complication    Hyperlipidemia    Hypersomnia with sleep apnea, unspecified    Impotence of organic origin    Kyphosis    Lumbago    Obstructive sleep apnea (adult) (pediatric)    Pneumonia    hx of years ago    Senile osteoporosis    Trigger finger (acquired)    Tubular adenoma of colon 05/2013   Unspecified essential hypertension    Unspecified sleep apnea    CPAP- settings 4-12    Unspecified vitamin D deficiency    Past Surgical History:  Procedure Laterality Date   CARDIAC CATHETERIZATION  03/04/2009   Dr Dorris Fetch   COLONOSCOPY     CORONARY ARTERY BYPASS GRAFT  2010   CYSTOSCOPY WITH RETROGRADE PYELOGRAM, URETEROSCOPY AND STENT PLACEMENT Right 10/04/2013   Procedure: CYSTOSCOPY WITH RETROGRADE PYELOGRAM,  AND STENT PLACEMENT;  Surgeon: Jerilee Field, MD;  Location: [facility];  Service: Urology;  Laterality: Right;   DENTAL SURGERY N/A    MOLE REMOVAL  1958    chin   ROTATOR CUFF REPAIR Left 04/1998   with bone spur removed, Dr Madelon Lips   TONSILLECTOMY  1945   TRANSURETHRAL RESECTION OF PROSTATE N/A 09/24/2013   Procedure: TRANSURETHRAL RESECTION OF THE PROSTATE (TURP) WITH GYRUS (STAGED RIGHT LATERAL AND MEDIAN LOBE);  Surgeon: Kathi Ludwig, MD;  Location: [facility];  Service: Urology;  Laterality: N/A;   UPPER GI ENDOSCOPY     Patient Active Problem List   Diagnosis Date Noted   Encounter for therapeutic drug monitoring 10/25/2021   Atrial fibrillation (HCC) 10/25/2021   PVC's (premature ventricular contractions) 05/04/2021   COVID-19 virus infection 02/20/2021   Sinusitis 01/24/2021   New onset atrial fibrillation (HCC) 01/19/2021   AKI (acute kidney injury) (HCC) 01/19/2021   HFrEF (heart failure with reduced ejection fraction) (HCC) 01/19/2021   CAD (coronary artery disease) 10/11/2017   IDA (iron deficiency anemia) 08/31/2016   Seasonal and perennial allergic rhinitis 08/28/2016   Palpitations 09/07/2015   Cough, persistent 07/20/2015   Senile osteoporosis 06/09/2014   Shortness of breath 03/20/2014   Hydronephrosis of right kidney 10/03/2013   Benign prostatic hyperplasia 09/24/2013   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   Dyslipidemia 04/06/2009   CARDIOVASCULAR FUNCTION STUDY, ABNORMAL 03/03/2009  Obstructive sleep apnea 02/04/2009   Essential hypertension 09/02/2008   G E R D 09/02/2008   PERIODIC LIMB MOVEMENT DISORDER 09/03/2007    PCP: [name]  REFERRING PROVIDER: Albertha Ghee MD  REFERRING DIAG: thoracic spine kyphosis; cervical lordosis; osteoporosis  THERAPY DIAG:  postural dysfunction; neck pain   Rationale for Evaluation and Treatment Rehabilitation  ONSET DATE: 05/07/21  SUBJECTIVE:                                                                                                                                                                                                         SUBJECTIVE  STATEMENT: Doing OK.  Haven't been by the gym yet.    PERTINENT HISTORY:  2 cracked vertebra in cervical spine a few years ago, no known injury Osteoporosis with 2 inch height loss  PAIN:  Are you having pain? 0/10 NPRS scale:  neck soreness "a little" Pain location: lower cervical and between shoulder blades Aggravating factors: walking the dog, sitting at church; trouble getting up off the floor Relieving factors: take a Tylenol  PRECAUTIONS: Other: osteoporosis  WEIGHT BEARING RESTRICTIONS No  FALLS:  Has patient fallen in last 6 months? Yes. Number of falls tripped and did hit the floor  LIVING ENVIRONMENT: Lives with: lives with their spouse Lives in: House/apartment Stairs: Yes: External: 2 steps; on right going up   OCCUPATION:retired  PLOF: Independent with basic ADLs  PATIENT GOALS get my back straightened up  OBJECTIVE:    PATIENT SURVEYS:  12/06/2021:  FOTO 58%  9/27: 69%   11/21:  67% goal met taken out of FOTO   COGNITION: Overall cognitive status: Within functional limits for tasks assessed    POSTURE: forward head and increased thoracic kyphosis Measurements standing against the wall:   At rest: ear lobe to wall 21 cm, with effect 18.5 cm ear lobe to wall Shoulders to wall 12 cm  8/30: ear lobe to wall 22 cm  9/27: 17cm ear lobe  10/11: 20 cm ear lobe to wall  11/8:  21 cm ear lobe to wall   11/21:  18.5 cm ear lobe to wall   CERVICAL ROM:   Active ROM A/PROM (deg) eval 8/23 8/30 9/14 9/27 10/11 11/8 11/21  Flexion 55         Extension 15 23  25  38 25 32 25  Right lateral flexion 13 20  25 25  30 30 20   Left lateral flexion 20 23  25 25 25 25 25   Right rotation 40 30 35  40  32 50 40  Left rotation 50 35 40  45 43 55 45   (Blank rows = not tested)  UPPER EXTREMITY ROM: bil UE WFLS  UPPER EXTREMITY MMT:  Eval:  grossly 4/5 UEs except middle and lower traps 4-/5  11/21:  UE grossly 4/5  FUNCTIONAL TESTS:  12/12/2021: 5  times sit to stand: 19.3 sec with UE use required 9/27:  20 sec no hands 11/21:  16.53  no hands   04/18/2022: 5 times sit to stand: 11.2 sec with use of UE and legs against back of mat    TODAY'S TREATMENT:  05/09/22: Nustep L5 new model 6 min  while discussing status  Seated red band horizontal horizontal abduction Seated red band diagonals 10x each side Standing Green band rows 15x Standing green band bil shoulder extension 15x Seated thoracic extension with ball 10x Seated 2# weights press overhead 10x2 Back to wall snow angels 10x Facing wall bil UE elevation wall slides 10x Counter push ups 2x10 Review of previous shoulder ex's added to HEP Leg press 75# 15x; 35# single leg 10x right/left       04/26/22: Nustep L5 new model 6 min  while discussing status  Seated red band: horizontal abduction, diagonals, Ws each way 10x each Counter top push ups 10x Facing wall sliding 2# weights up wall alternating10x Seated cable row 15# 2x10 Leg press 75# 15x; 35# single leg 15 right/left Seated lat pull downs with cable 15# 15x Back to wall: inhale with bil UE elevation 5x, snow angels 5x Ambulation  holding 8# dumbbells in each hand 2 laps  Seated neck extension against yellow band 15x Leaning on hands on low mat table with head lift 15x 5 sec holds Facing wall chest on green ball: W squeeze with head lift 15x Height measurement on wall: 63.5 inches  Neuromuscular re-ed: activation of cervical extensors and scapular retractors Therapeutic activity: standing and sitting with head in neutral with walking the dog, mowing; encouraged elevated book height when reading in bed or with sitting    04/18/2022: Nustep level 4 (old model) x6 min with PT present to discuss status and encourage pt to look ahead instead of down Sit to/from stand 2x5 Ambulation down PT gym hallway and back holding 2# dumbbells in each hand, seated recovery period, then another lap holding 7# in each  hand Seated shoulder ER and horizontal abduction with green tband 2x10 (cuing for upright posture) Seated shoulder D2 with green tband 2x10 bilat Seated isometric head retraction against ball on wall 5 sec hold 10x Seated head pressure on ball with cervical rotation 5x each way Seated thoracic/cervical extension with back against ball 2x10 Standing cable rows 15# 2x10 Standing lat pulls 25# 2x10   PATIENT EDUCATION:  Education details: initial HEP with green band Person educated: Patient Education method: Explanation, Demonstration, and Handouts Education comprehension: verbalized understanding and returned demonstration   HOME EXERCISE PROGRAM: Access Code: [facility] URL: [email or web address] Date: 05/09/2022 Prepared by: Lavinia Sharps  Exercises - Seated Shoulder Horizontal Abduction with Resistance - Thumbs Up  - 1 x daily - 7 x weekly - 1 sets - 10 reps - Seated Shoulder Diagonal with Resistance  - 1 x daily - 7 x weekly - 2 sets - 10 reps - Standing Bilateral Low Shoulder Row with Anchored Resistance  - 1 x daily - 7 x weekly - 2 sets - 10 reps - Shoulder Extension with Resistance Hands Down  - 1 x daily - 7 x weekly -  2 sets - 10 reps - Seated Thoracic Lumbar Extension with Pectoralis Stretch  - 1 x daily - 7 x weekly - 1 sets - 10 reps - Seated Overhead Press with Dumbbells  - 1 x daily - 7 x weekly - 2 sets - 10 reps - Standing Scapular Elevation at Wall  - 1 x daily - 7 x weekly - 1 sets - 10 reps - Standing Shoulder Flexion Stretch on Wall  - 1 x daily - 7 x weekly - 1 sets - 10 reps - Push ups on kitchen counter  - 1 x daily - 7 x weekly - 1 sets - 10 reps - Supine Shoulder Horizontal Abduction with Dumbbells  - 1 x daily - 7 x weekly - 1 sets - 10 reps - Prone Shoulder Extension - Single Arm with Dumbbell  - 1 x daily - 7 x weekly - 1 sets - 10 reps - Supine Shoulder Flexion with Free Weight  - 1 x daily - 7 x weekly - 1 sets - 10  reps  ASSESSMENT:  CLINICAL IMPRESSION: Treatment focus on finalizing HEP.  Therapist progressing and updating HEP for increased intensity and challenge level for further strengthening and functional mobility.   He reports some mild neck discomfort post session most likely to use of extensor muscles.  Some cueing needed for head up positioning during ex.       OBJECTIVE IMPAIRMENTS decreased ROM, decreased strength, impaired UE functional use, postural dysfunction, and pain.   ACTIVITY LIMITATIONS carrying, lifting, and reading, sitting at church  PARTICIPATION LIMITATIONS: driving, shopping, community activity, and church  PERSONAL FACTORS Age and Time since onset of injury/illness/exacerbation are also affecting patient's functional outcome.   REHAB POTENTIAL: Good  CLINICAL DECISION MAKING: Stable/uncomplicated  EVALUATION COMPLEXITY: Low   GOALS: Goals reviewed with patient? Yes  SHORT TERM GOALS: Target date: 01/03/2022   The patient will demonstrate knowledge of basic self care strategies and exercises to promote healing  (using lumbar roll, book prop) Baseline:  Goal status: goal met 8/30  2.  Improved postural alignment with ear lobe to wall measurement decreased to 17 cm Baseline: 22 cm Goal status:  ongoing  3.  Improved rotation ROM to 55 degrees bil needed for driving Baseline:  Goal status: ongoing  4.  The patient will report lower cervical and periscapular pain level decreased by 25% Baseline:   Goal status: goal met  9/27 30%    LONG TERM GOALS: Target date: 05/22/2022  The patient will be independent in a safe self progression of a home exercise program to promote further recovery of function   Baseline:  Goal status: ongoing  2.  Cervical sidebending ROM improved to 30 degrees and extension to 25 degrees needed for driving Baseline:  Goal status: ongoing   3.  The patient will have improved standing postural alignment with ear lobe to wall  measurement 15cm  Baseline:  Goal status: ongoing   4.  The patient will have improved FOTO score to    63%   indicating improved function with less pain  Baseline:  Goal status: goal met 11/21  5.  Lower neck pain and interscapular pain decreased by 50% Baseline:  Goal status: goal met 11/21   6.  Able to start a regular gym program new     PLAN: PT FREQUENCY: biweekly  PT DURATION: 8 weeks  PLANNED INTERVENTIONS: Therapeutic exercises, Therapeutic activity, Neuromuscular re-education, Patient/Family education, Self Care, Joint mobilization, Aquatic Therapy, Dry  Needling, Spinal mobilization, Cryotherapy, Moist heat, Taping, Traction, Ultrasound, Manual therapy, and Re-evaluation  PLAN FOR NEXT SESSION:   possible discharge;  20th visit progress note; check response to comprehensive HEP;  recheck ROM, strength, progress toward LTGs;   pt may go to the gym and want to do periodic follow biweekly;  leg press;  Nu-Step; UBE;   postural ex's and progress;  thoracic extension mobility; cervical ROM; manual therapy as needed  Lavinia Sharps, PT 05/09/22 4:37 PM Phone: [phone] Fax: [phone]  [facility] [address] [place], [place] [address] Phone # [phone] Fax [phone]    PHYSICAL THERAPY DISCHARGE SUMMARY  As of 08/23/2022, patient has not returned for follow up visits.  Patient discharged at this time.  Patient was on track for possible discharge at time of last visit.  Patient agrees to discharge. Patient goals were partially met. Patient is being discharged due to not returning since the last visit.  Shaunda Menke, PT 08/23/22 10:05 AM

## 2022-05-09 NOTE — Telephone Encounter (Signed)
Spoke with patient who asked if he was supposed to have an EP referral. Explained to patient he has been seen by EP, that he is to f/u in April with EP. He is waiting to be scheduled in.

## 2022-05-09 NOTE — Telephone Encounter (Signed)
New Message:    Patient said Brent Griffith had said she was going to refer him to an EP doctor. He said he not heard anything  since his office visit.

## 2022-05-09 NOTE — Telephone Encounter (Signed)
Patient has been approved for assistance for jardiance.

## 2022-05-10 ENCOUNTER — Telehealth: Payer: Self-pay

## 2022-05-10 ENCOUNTER — Ambulatory Visit: Payer: Self-pay

## 2022-05-10 ENCOUNTER — Ambulatory Visit: Payer: Medicare Other | Attending: Cardiology | Admitting: *Deleted

## 2022-05-10 DIAGNOSIS — I10 Essential (primary) hypertension: Secondary | ICD-10-CM

## 2022-05-10 DIAGNOSIS — I4891 Unspecified atrial fibrillation: Secondary | ICD-10-CM

## 2022-05-10 DIAGNOSIS — Z5181 Encounter for therapeutic drug level monitoring: Secondary | ICD-10-CM | POA: Diagnosis not present

## 2022-05-10 LAB — POCT INR: INR: 3 (ref 2.0–3.0)

## 2022-05-10 NOTE — Patient Instructions (Signed)
Description   USE WARFARIN 2.5mg tablets (Mint green color). Continue taking Warfarin 1/2 tablet daily except for 1 tablet on Monday and Friday. Recheck INR in 3 weeks.  Pt drink's boost daily. [facility] [phone]     

## 2022-05-10 NOTE — Telephone Encounter (Signed)
Glenard Haring, Nurse coordinator with [facility] called and left message on clinical intake voicemail. She states that patient would like to participate in provider referral exercise program. Referral can be placed through [facility]. It is a prep referral.  Message routed to Sherrie Mustache, NP

## 2022-05-10 NOTE — Patient Outreach (Signed)
  Care Coordination   Follow Up Visit Note   05/10/2022 Name: Brent Griffith MRN: [id number] DOB: 01/13/1936  Brent Griffith is a 87 y.o. year old male who sees Eubanks, Carlos American, NP for primary care. I spoke with  Brent Griffith by phone today.  What matters to the patients health and wellness today?  Patient would like to participate in the PREP program.     Goals Addressed               This Visit's Progress     Patient Stated     I would like to participate in the [facility] exercise program (pt-stated)        Care Coordination Interventions: Evaluation of current treatment plan related to impaired physical mobility and patient's adherence to plan as established by provider Determined patient is currently participating in outpatient PT, he has one week remaining  Discussed patient's goal is to continue his exercise regimen, he would like to visit the [facility] Educated patient about the provider referral exercise program (PREP) and determined patient would like to participate Placed outbound call to PCP provider, left a voice message requesting a PREP referral be placed via EP[facility]        Other     COMPLETED: I would like to know what I can eat while taking Warfarin        Care Coordination Interventions: Evaluation of current treatment plan related to anticoagulation therapy and patient's adherence to plan as established by provider Confirmed patient received the list of Vitamin K rich foods to avoid while taking Warfarin Discussed patient is adhering to lab schedule for INR checks as directed, he adhering to the dosing recommendations as directed  Discussed with patient that is next scheduled lab visit is scheduled for today, 05/10/22        SDOH assessments and interventions completed:  No     Care Coordination Interventions:  Yes, provided   Follow up plan: Follow up call scheduled for 06/01/22 '@12'$  PM    Encounter Outcome:  Pt. Visit Completed

## 2022-05-10 NOTE — Patient Instructions (Signed)
Visit Information  Thank you for taking time to visit with me today. Please don't hesitate to contact me if I can be of assistance to you.   Following are the goals we discussed today:   Goals Addressed               This Visit's Progress     Patient Stated     I would like to participate in the [facility] exercise program (pt-stated)        Care Coordination Interventions: Evaluation of current treatment plan related to impaired physical mobility and patient's adherence to plan as established by provider Determined patient is currently participating in outpatient PT, he has one week remaining  Discussed patient's goal is to continue his exercise regimen, he would like to visit the [facility] Educated patient about the provider referral exercise program (PREP) and determined patient would like to participate Placed outbound call to PCP provider, left a voice message requesting a PREP referral be placed via EPIC        Other     COMPLETED: I would like to know what I can eat while taking Warfarin        Care Coordination Interventions: Evaluation of current treatment plan related to anticoagulation therapy and patient's adherence to plan as established by provider Confirmed patient received the list of Vitamin K rich foods to avoid while taking Warfarin Discussed patient is adhering to lab schedule for INR checks as directed, he adhering to the dosing recommendations as directed  Discussed with patient that is next scheduled lab visit is scheduled for today, 05/10/22        Our next appointment is by telephone on 06/01/22 at 12 PM  Please call the care guide team at [phone] if you need to cancel or reschedule your appointment.   If you are experiencing a Mental Health or [place] or need someone to talk to, please go to [facility] [address] [place] [phone])   Patient verbalizes understanding of instructions and care  plan provided today and agrees to view in [place]. Active MyChart status and patient understanding of how to access instructions and care plan via MyChart confirmed with patient.     Barb Merino, RN, BSN, CCM Care Management Coordinator [facility] Direct Phone: [phone]

## 2022-05-15 MED ORDER — CARVEDILOL 3.125 MG PO TABS: 3.1250 mg | ORAL_TABLET | Freq: Two times a day (BID) | ORAL | Status: DC

## 2022-05-17 ENCOUNTER — Emergency Department (HOSPITAL_BASED_OUTPATIENT_CLINIC_OR_DEPARTMENT_OTHER)
Admission: EM | Admit: 2022-05-17 | Discharge: 2022-05-17 | Disposition: A | Discharge status: Home / Self Care | Payer: Medicare Other | Attending: Emergency Medicine | Admitting: Emergency Medicine

## 2022-05-17 ENCOUNTER — Other Ambulatory Visit: Payer: Self-pay

## 2022-05-17 DIAGNOSIS — J00 Acute nasopharyngitis [common cold]: Secondary | ICD-10-CM | POA: Diagnosis not present

## 2022-05-17 DIAGNOSIS — Z7951 Long term (current) use of inhaled steroids: Secondary | ICD-10-CM | POA: Diagnosis not present

## 2022-05-17 DIAGNOSIS — H6121 Impacted cerumen, right ear: Secondary | ICD-10-CM

## 2022-05-17 DIAGNOSIS — Z7901 Long term (current) use of anticoagulants: Secondary | ICD-10-CM | POA: Diagnosis not present

## 2022-05-17 DIAGNOSIS — Z20822 Contact with and (suspected) exposure to covid-19: Secondary | ICD-10-CM | POA: Diagnosis not present

## 2022-05-17 DIAGNOSIS — J45909 Unspecified asthma, uncomplicated: Secondary | ICD-10-CM | POA: Insufficient documentation

## 2022-05-17 DIAGNOSIS — I10 Essential (primary) hypertension: Secondary | ICD-10-CM | POA: Insufficient documentation

## 2022-05-17 DIAGNOSIS — I2510 Atherosclerotic heart disease of native coronary artery without angina pectoris: Secondary | ICD-10-CM | POA: Diagnosis not present

## 2022-05-17 DIAGNOSIS — H9201 Otalgia, right ear: Secondary | ICD-10-CM | POA: Diagnosis present

## 2022-05-17 DIAGNOSIS — Z79899 Other long term (current) drug therapy: Secondary | ICD-10-CM | POA: Insufficient documentation

## 2022-05-17 LAB — RESP PANEL BY RT-PCR (RSV, FLU A&B, COVID)  RVPGX2
Influenza A by PCR: NEGATIVE
Influenza B by PCR: NEGATIVE
Resp Syncytial Virus by PCR: NEGATIVE
SARS Coronavirus 2 by RT PCR: NEGATIVE

## 2022-05-17 NOTE — ED Triage Notes (Signed)
C/o cough and ear pain since Sunday evening.

## 2022-05-17 NOTE — ED Provider Notes (Signed)
[facility] Provider Note   CSN: [id number] Arrival date & time: 05/17/22  1653     History  Chief Complaint  Patient presents with   Otalgia   Cough    Brent Griffith is a 87 y.o. male with PMH asthma, CAD, HLD, HTN, who presents to ED c/o increased tinnitus in right ear over the last few days. He has tinnitus at baseline since he was a teenager but not as severe. He c/o pressure like sensation to right ear as well. Slight cough. Wife with respiratory symptoms too. No fever, chest pain, SOB, nausea, vomiting. No history of lung disease. Otherwise feels well and is in her normal state of health.    Home Medications Prior to Admission medications   Medication Sig Start Date End Date Taking? Authorizing Provider  pantoprazole (PROTONIX) 40 MG tablet TAKE 1 TABLET BY MOUTH DAILY 04/23/22   [name], NP  albuterol (VENTOLIN HFA) 108 (90 Base) MCG/ACT inhaler USE 1 TO 2 INHALATIONS BY  MOUTH EVERY 6 HOURS AS  NEEDED FOR WHEEZING OR  SHORTNESS OF BREATH 03/03/20   [name], NP  amiodarone (PACERONE) 200 MG tablet Take 1 tablet (200 mg total) by mouth daily. 04/09/22   Lelon Perla, MD  Calcium Citrate-Vitamin D (EQ CALCIUM CITRATE+D3 PO) Take 1,200 mg by mouth daily.    [provider]  carvedilol (COREG) 3.125 MG tablet Take 1 tablet (3.125 mg total) by mouth 2 (two) times daily. 05/15/22   Lelon Perla, MD  Cholecalciferol (VITAMIN D-3) 5000 units TABS Take 10,000 Units by mouth daily.    [provider]  Coenzyme Q10 (COQ10) 200 MG CAPS Take 200 mg by mouth daily.    [provider]  doxazosin (CARDURA) 2 MG tablet TAKE 1 TABLET BY MOUTH DAILY 03/27/22   Lelon Perla, MD  empagliflozin (JARDIANCE) 10 MG TABS tablet Take 1 tablet (10 mg total) by mouth daily before breakfast. 04/16/22   Lelon Perla, MD  fexofenadine (ALLEGRA) 180 MG tablet Take 180 mg by mouth daily as needed for allergies.     [provider]  finasteride (PROSCAR) 5 MG tablet TAKE 1 TABLET BY MOUTH IN  THE MORNING 10/06/21   [name], NP  folic acid (FOLVITE) 1 MG tablet TAKE 1 TABLET BY MOUTH  DAILY 11/13/21   [name], NP  furosemide (LASIX) 20 MG tablet TAKE 1 TABLET BY MOUTH DAILY AS  NEEDED FOR LEG SWELLING 05/01/22   Lelon Perla, MD  Iron, Ferrous Sulfate, 325 (65 Fe) MG TABS Take 325 mg by mouth 2 (two) times daily. 09/13/21   [name], NP  losartan (COZAAR) 25 MG tablet TAKE 1 TABLET BY MOUTH DAILY 04/23/22   Lelon Perla, MD  mexiletine (MEXITIL) 150 MG capsule Take 1 capsule (150 mg total) by mouth 2 (two) times daily. 02/19/22   Vickie Epley, MD  OVER THE COUNTER MEDICATION Take 1-2 tablets by mouth at bedtime as needed (pain). Percogosic (tylenol with allergy medicine)    [provider]  rosuvastatin (CRESTOR) 20 MG tablet TAKE 1 TABLET BY MOUTH ONCE DAILY FOR CHOLESTEROL 11/13/21   [name], NP  warfarin (COUMADIN) 2.5 MG tablet Take 1/2 a tablet to 1 tablet by mouth daily as directed by the coumadin clinic 05/03/22   Lelon Perla, MD      Allergies    Compazine [prochlorperazine edisylate]    Review  of Systems   Review of Systems  Constitutional:  Negative for activity change, appetite change, chills, fatigue and fever.  HENT:  Positive for ear pain and tinnitus. Negative for congestion, hearing loss, rhinorrhea, sinus pressure, sinus pain, sneezing, sore throat, trouble swallowing and voice change.   Eyes:  Negative for photophobia, pain and visual disturbance.  Respiratory:  Positive for cough. Negative for chest tightness, shortness of breath and wheezing.   Cardiovascular:  Negative for chest pain, palpitations and leg swelling.  Gastrointestinal:  Negative for abdominal pain, diarrhea, nausea and vomiting.  Genitourinary:  Negative for dysuria and hematuria.  Musculoskeletal:  Negative for arthralgias, back pain,  myalgias, neck pain and neck stiffness.  Skin:  Negative for color change and rash.  Neurological:  Negative for dizziness, seizures, syncope, speech difficulty, light-headedness and headaches.  All other systems reviewed and are negative.   Physical Exam Updated Vital Signs BP 127/61 (BP Location: Right Arm)   Pulse 62   Temp 97.7 F (36.5 C) (Oral)   Resp 18   Ht '5\' 2"'$  (1.575 m)   Wt 57.2 kg   SpO2 98%   BMI 23.05 kg/m  Physical Exam Vitals and nursing note reviewed.  Constitutional:      General: He is not in acute distress.    Appearance: Normal appearance. He is not ill-appearing or toxic-appearing.  HENT:     Head: Normocephalic and atraumatic.     Right Ear: External ear normal.     Left Ear: Tympanic membrane, ear canal and external ear normal. There is no impacted cerumen.     Ears:     Comments: Partially visualized TM to right ear that is non-erythematous, non-bulging, and with no drainage or signs of TM rupture, significant cerumen to canal partially obstructing TM but otherwise unremarkable ear exam    Nose: Nose normal.     Mouth/Throat:     Mouth: Mucous membranes are moist.     Pharynx: Oropharynx is clear. No oropharyngeal exudate or posterior oropharyngeal erythema.  Eyes:     Extraocular Movements: Extraocular movements intact.     Conjunctiva/sclera: Conjunctivae normal.  Cardiovascular:     Rate and Rhythm: Normal rate and regular rhythm.     Heart sounds: No murmur heard. Pulmonary:     Effort: Pulmonary effort is normal. No respiratory distress.     Breath sounds: Normal breath sounds. No wheezing, rhonchi or rales.  Abdominal:     General: Abdomen is flat.     Palpations: Abdomen is soft.     Tenderness: There is no abdominal tenderness.  Musculoskeletal:        General: Normal range of motion.     Cervical back: Normal range of motion and neck supple.     Right lower leg: No edema.     Left lower leg: No edema.  Skin:    General: Skin is  warm and dry.     Capillary Refill: Capillary refill takes less than 2 seconds.  Neurological:     General: No focal deficit present.     Mental Status: He is alert and oriented to person, place, and time.     Cranial Nerves: No cranial nerve deficit.     Motor: No weakness.  Psychiatric:        Mood and Affect: Mood normal.        Behavior: Behavior normal.     ED Results / Procedures / Treatments   Labs (all labs ordered are listed, but  only abnormal results are displayed) Labs Reviewed  RESP PANEL BY RT-PCR (RSV, FLU A&B, COVID)  [id number]    EKG None  Radiology No results found.  Procedures Procedures   Medications Ordered in ED Medications - No data to display  ED Course/ Medical Decision Making/ A&P                           Medical Decision Making  This is an 87 year old male who presents to ED in his usual state of health apart from very mild cough c/o increased tinnitus to right ear. He has this chronically but it is more so than normal with slight pressure like sensation to right ear. Negative viral swabs. No dizziness, gait changes, neurological complaints. He has partial cerumen impaction to right ear. He has had this recurrently in the past. Otherwise, left ear appears normal, lungs clear to auscultation, and patient neurologically intact. Discussed with pt possibility of irrigating ear to remove cerumen. He has had this once before in the past. He is having no other symptoms and has not tried wax removal medications so we will opt to attempt this treatment in the outpatient setting and pt aware he should be rechecked by his PCP to ensure resolution and/or if this becomes an unresolved or recurrent issue I recommend he see ENT for further evaluation. Pt expressed understanding of this plan and stable at time of discharge.          Final Clinical Impression(s) / ED Diagnoses Final diagnoses:  Impacted cerumen of right ear  Acute nasopharyngitis    Rx / DC  Orders ED Discharge Orders     None         Suzzette Righter, PA-C 05/20/22 [id number]    Gareth Morgan, MD 05/21/22 [phone]

## 2022-05-17 NOTE — Discharge Instructions (Addendum)
Please see the attached instructions for managing the wax buildup in your ear.  I do not see any infection in your ear and your lungs are clear so I do not suspect an ear infection or pneumonia related to your current symptoms. If your symptoms worsen or you develop fever, chest pain, shortness of breath, or other concerns, please be re-evaluated.  You may get medications over the counter to help dissolve and remove the wax. One example is Debrox. Please use this as instructed. There is more information in attached documents regarding how to relieve this condition. You may also get irrigation kits to help remove the wax from the ear. Please do this cautiously and only if your symptoms do not change.  Please see your PCP next week for re-evaluation or be seen sooner if you get worse.

## 2022-05-18 ENCOUNTER — Telehealth: Payer: Self-pay

## 2022-05-18 NOTE — Telephone Encounter (Signed)
Called to discuss PREP program referral, left voicemail  

## 2022-05-21 ENCOUNTER — Ambulatory Visit: Payer: Medicare Other | Admitting: Adult Health

## 2022-05-21 ENCOUNTER — Encounter: Payer: Self-pay | Admitting: Family

## 2022-05-21 ENCOUNTER — Ambulatory Visit (INDEPENDENT_AMBULATORY_CARE_PROVIDER_SITE_OTHER): Payer: Medicare Other | Admitting: Family

## 2022-05-21 VITALS — BP 110/80 | HR 74 | Temp 97.8°F | Resp 16 | Ht 62.0 in | Wt 131.2 lb

## 2022-05-21 DIAGNOSIS — H6123 Impacted cerumen, bilateral: Secondary | ICD-10-CM

## 2022-05-21 DIAGNOSIS — J069 Acute upper respiratory infection, unspecified: Secondary | ICD-10-CM

## 2022-05-21 LAB — POCT INFLUENZA A/B
Influenza A, POC: NEGATIVE
Influenza B, POC: NEGATIVE

## 2022-05-21 MED ORDER — DEBROX 6.5 % OT SOLN: 5.0000 [drp] | Freq: Two times a day (BID) | OTIC | 0 refills | Status: AC

## 2022-05-21 MED ORDER — AMOXICILLIN-POT CLAVULANATE 875-125 MG PO TABS: 1.0000 | ORAL_TABLET | Freq: Two times a day (BID) | ORAL | 0 refills | Status: AC

## 2022-05-21 NOTE — Patient Instructions (Signed)
-  Instill debrox 6.5 otic solution 5 drops into each ear twice daily x 4 days then follow up for ear lavage.May apply cotton ball at bedtime to prevent drainage to pillow.

## 2022-05-21 NOTE — Progress Notes (Signed)
Provider: Kasee Hantz FNP-C  [name], NP  Patient Care Team: [name], NP as PCP - General (Geriatric Medicine) Lelon Perla, MD as PCP - Cardiology (Cardiology) [name], MD as Consulting Physician (Sports Medicine) Clent Jacks, MD as Referring Physician (Ophthalmology)  Extended Emergency Contact Information Primary Emergency Contact: Bednarski,Loretta Address: [address]          [place], [place] [address] [place] of [place] Phone: [phone] Mobile Phone: [phone] Relation: Spouse  Code Status:  Full Code  Goals of care: Advanced Directive information    05/21/2022    2:54 PM  Advanced Directives  Does Patient Have a Medical Advance Directive? No  Would patient like information on creating a medical advance directive? No - Patient declined     Chief Complaint  Patient presents with   Acute Visit    Patient complains of ear. Recently seen in ER 05/17/2022. ER provider recommended ENT referral.     HPI:  Pt is a 87 y.o. male seen today for an acute visit for evaluation of ear wax. States was in the ED on 05/17/2022 was tested for COVID-19,Flu and RSV but was negative.He denies any fever,chills,cough,fatigue,body aches,runny nose,chest tightness,chest pain,palpitation or shortness of breath.Has chronic tinnitus.  He complains of right ear pain radiating down to throat area.  Wife here being seen by another NP reports patient's wife is positive for Influenza A    Past Medical History:  Diagnosis Date   Acute gastric ulcer without mention of hemorrhage, perforation, or obstruction    Allergic rhinitis, cause unspecified    Anemia, unspecified    Arthritis    Asthma    Blood transfusion without reported diagnosis    CAD (coronary artery disease)    Diaphragmatic hernia without mention of obstruction or gangrene    Elevated prostate specific antigen (PSA)    Enlarged prostate    Esophageal reflux    Extrinsic  asthma, unspecified    Herpes zoster without mention of complication    Hyperlipidemia    Hypersomnia with sleep apnea, unspecified    Impotence of organic origin    Kyphosis    Lumbago    Obstructive sleep apnea (adult) (pediatric)    Pneumonia    hx of years ago    Senile osteoporosis    Trigger finger (acquired)    Tubular adenoma of colon 05/2013   Unspecified essential hypertension    Unspecified sleep apnea    CPAP- settings 4-12    Unspecified vitamin D deficiency    Past Surgical History:  Procedure Laterality Date   CARDIAC CATHETERIZATION  03/04/2009   Dr [name]   COLONOSCOPY     CORONARY ARTERY BYPASS GRAFT  2010   CYSTOSCOPY WITH RETROGRADE PYELOGRAM, URETEROSCOPY AND STENT PLACEMENT Right 10/04/2013   Procedure: CYSTOSCOPY WITH RETROGRADE PYELOGRAM,  AND STENT PLACEMENT;  Surgeon: Festus Aloe, MD;  Location: [facility];  Service: Urology;  Laterality: Right;   DENTAL SURGERY N/A    MOLE REMOVAL  1958   chin   ROTATOR CUFF REPAIR Left 04/1998   with bone spur removed, Dr French Ana   TONSILLECTOMY  1945   TRANSURETHRAL RESECTION OF PROSTATE N/A 09/24/2013   Procedure: TRANSURETHRAL RESECTION OF THE PROSTATE (TURP) WITH GYRUS (STAGED RIGHT LATERAL AND MEDIAN LOBE);  Surgeon: Ailene Rud, MD;  Location: [facility];  Service: Urology;  Laterality: N/A;   UPPER GI ENDOSCOPY      Allergies  Allergen Reactions  Compazine [Prochlorperazine Edisylate] Anxiety    Outpatient Encounter Medications as of 05/21/2022  Medication Sig   albuterol (VENTOLIN HFA) 108 (90 Base) MCG/ACT inhaler USE 1 TO 2 INHALATIONS BY  MOUTH EVERY 6 HOURS AS  NEEDED FOR WHEEZING OR  SHORTNESS OF BREATH   amiodarone (PACERONE) 200 MG tablet Take 1 tablet (200 mg total) by mouth daily.   Calcium Citrate-Vitamin D (EQ CALCIUM CITRATE+D3 PO) Take 1,200 mg by mouth daily.   carvedilol (COREG) 3.125 MG tablet Take 1 tablet (3.125 mg total) by mouth 2 (two) times daily.   Cholecalciferol  (VITAMIN D-3) 5000 units TABS Take 10,000 Units by mouth daily.   Coenzyme Q10 (COQ10) 200 MG CAPS Take 200 mg by mouth daily.   doxazosin (CARDURA) 2 MG tablet TAKE 1 TABLET BY MOUTH DAILY   empagliflozin (JARDIANCE) 10 MG TABS tablet Take 1 tablet (10 mg total) by mouth daily before breakfast.   fexofenadine (ALLEGRA) 180 MG tablet Take 180 mg by mouth daily as needed for allergies.   finasteride (PROSCAR) 5 MG tablet TAKE 1 TABLET BY MOUTH IN  THE MORNING   folic acid (FOLVITE) 1 MG tablet TAKE 1 TABLET BY MOUTH  DAILY   furosemide (LASIX) 20 MG tablet TAKE 1 TABLET BY MOUTH DAILY AS  NEEDED FOR LEG SWELLING   Iron, Ferrous Sulfate, 325 (65 Fe) MG TABS Take 325 mg by mouth 2 (two) times daily.   losartan (COZAAR) 25 MG tablet TAKE 1 TABLET BY MOUTH DAILY   mexiletine (MEXITIL) 150 MG capsule Take 1 capsule (150 mg total) by mouth 2 (two) times daily.   OVER THE COUNTER MEDICATION Take 1-2 tablets by mouth at bedtime as needed (pain). Percogosic (tylenol with allergy medicine)   pantoprazole (PROTONIX) 40 MG tablet TAKE 1 TABLET BY MOUTH DAILY   rosuvastatin (CRESTOR) 20 MG tablet TAKE 1 TABLET BY MOUTH ONCE DAILY FOR CHOLESTEROL   warfarin (COUMADIN) 2.5 MG tablet Take 1/2 a tablet to 1 tablet by mouth daily as directed by the coumadin clinic   No facility-administered encounter medications on file as of 05/21/2022.    Review of Systems  Constitutional:  Negative for appetite change, chills, fatigue, fever and unexpected weight change.  HENT:  Positive for congestion, ear pain, rhinorrhea and tinnitus. Negative for dental problem, ear discharge, facial swelling, hearing loss, nosebleeds, postnasal drip, sinus pressure, sinus pain, sneezing, sore throat and trouble swallowing.   Eyes:  Negative for pain, discharge, redness, itching and visual disturbance.  Respiratory:  Positive for cough. Negative for chest tightness, shortness of breath and wheezing.   Cardiovascular:  Negative for chest  pain, palpitations and leg swelling.  Gastrointestinal:  Negative for abdominal distention, abdominal pain, diarrhea, nausea and vomiting.  Skin:  Negative for color change, pallor, rash and wound.  Neurological:  Negative for dizziness, speech difficulty, weakness, light-headedness and headaches.    Immunization History  Administered Date(s) Administered   Fluad Quad(high Dose 65+) 01/25/2020, 02/20/2021, 03/16/2022   Influenza Split 01/30/2012, 02/10/2014   Influenza Whole 02/17/2009, 02/18/2013   Influenza, High Dose Seasonal PF 03/19/2018, 02/02/2019   Influenza, Quadrivalent, Recombinant, Inj, Pf 02/23/2017   Influenza,inj,Quad PF,6+ Mos 02/23/2017   Influenza-Unspecified 02/08/2014, 02/24/2015, 03/16/2016   Moderna Sars-Covid-2 Vaccination 07/21/2019, 08/18/2019, 05/03/2020, 09/28/2020, 01/11/2021   Pneumococcal Conjugate-13 04/09/2013   Pneumococcal Polysaccharide-23 10/18/2004   Td 09/29/1990   Tdap 07/25/2011   Zoster Recombinat (Shingrix) 10/09/2016, 04/06/2021, 07/04/2021   Pertinent  Health Maintenance Due  Topic Date Due   COLONOSCOPY (  Pts 45-25yr Insurance coverage will need to be confirmed)  05/15/2020   INFLUENZA VACCINE  Completed      11/09/2021    8:56 AM 02/15/2022    4:43 PM 03/16/2022    2:33 PM 05/17/2022    5:28 PM 05/21/2022    2:54 PM  Fall Risk  Falls in the past year? 1  1  0  Was there an injury with Fall? 0  0  0  Fall Risk Category Calculator 2  1  0  Fall Risk Category (Retired) Moderate  Low    (RETIRED) Patient Fall Risk Level Moderate fall risk Low fall risk Low fall risk Low fall risk   Patient at Risk for Falls Due to History of fall(s)  History of fall(s)  No Fall Risks  Fall risk Follow up Falls evaluation completed  Falls evaluation completed  Falls evaluation completed   Functional Status Survey:    Vitals:   05/21/22 1453  BP: 110/80  Pulse: 74  Resp: 16  Temp: 97.8 F (36.6 C)  SpO2: 96%  Weight: 131 lb 3.2 oz (59.5 kg)   Height: '5\' 2"'$  (1.575 m)   Body mass index is 24 kg/m. Physical Exam Vitals reviewed.  Constitutional:      General: He is not in acute distress.    Appearance: Normal appearance. He is normal weight. He is not ill-appearing or diaphoretic.  HENT:     Head: Normocephalic.     Right Ear: Tympanic membrane, ear canal and external ear normal. There is impacted cerumen.     Left Ear: Tympanic membrane, ear canal and external ear normal. There is impacted cerumen.     Nose: Congestion and rhinorrhea present.     Mouth/Throat:     Mouth: Mucous membranes are moist.     Pharynx: Oropharynx is clear. Posterior oropharyngeal erythema present. No oropharyngeal exudate.  Eyes:     General: No scleral icterus.       Right eye: No discharge.        Left eye: No discharge.     Extraocular Movements: Extraocular movements intact.     Conjunctiva/sclera: Conjunctivae normal.     Pupils: Pupils are equal, round, and reactive to light.  Neck:     Vascular: No carotid bruit.  Cardiovascular:     Rate and Rhythm: Normal rate and regular rhythm.     Pulses: Normal pulses.     Heart sounds: Normal heart sounds. No murmur heard.    No friction rub. No gallop.  Pulmonary:     Effort: Pulmonary effort is normal. No respiratory distress.     Breath sounds: Normal breath sounds. No wheezing, rhonchi or rales.  Chest:     Chest wall: No tenderness.  Abdominal:     General: Bowel sounds are normal. There is no distension.     Palpations: There is no mass.     Tenderness: There is no abdominal tenderness. There is no right CVA tenderness, left CVA tenderness, guarding or rebound.  Musculoskeletal:        General: No swelling or tenderness. Normal range of motion.     Cervical back: Normal range of motion. No rigidity or tenderness.     Right lower leg: No edema.     Left lower leg: No edema.  Lymphadenopathy:     Cervical: No cervical adenopathy.  Skin:    General: Skin is warm and dry.      Coloration: Skin is not pale.  Findings: No erythema or rash.  Neurological:     Mental Status: He is alert and oriented to person, place, and time.     Motor: No weakness.     Gait: Gait normal.  Psychiatric:        Mood and Affect: Mood normal.        Speech: Speech normal.        Behavior: Behavior normal.     Labs reviewed: Recent Labs    06/28/21 1505 09/08/21 1445 03/16/22 1455  NA 141 139 138  K 4.7 3.9 3.9  CL 102 106 104  CO2 '23 25 28  '$ GLUCOSE 97 101 118*  BUN '22 25 23  '$ CREATININE 1.38* 1.35* 1.33*  CALCIUM 9.4 8.8 8.5*  MG 2.2  --   --    Recent Labs    06/28/21 1505 09/08/21 1445 03/16/22 1455  AST 45* 33 29  ALT 41 35 30  ALKPHOS 69  --   --   BILITOT 0.7 0.6 0.9  PROT 6.6 6.2 6.1  ALBUMIN 4.4  --   --    Recent Labs    09/08/21 1445 12/08/21 1139 03/16/22 1455  WBC 5.2 5.3 4.9  NEUTROABS 3,411 3,636 3,474  HGB 10.7* 12.0* 12.9*  HCT 32.6* 34.4* 35.5*  MCV 84.2 93.2 95.2  PLT 191 139* 178   Lab Results  Component Value Date   TSH 1.330 06/28/2021   Lab Results  Component Value Date   HGBA1C 5.6 07/28/2012   Lab Results  Component Value Date   CHOL 122 03/16/2022   HDL 62 03/16/2022   LDLCALC 46 03/16/2022   TRIG 65 03/16/2022   CHOLHDL 2.0 03/16/2022    Significant Diagnostic Results in last 30 days:  No results found.  Assessment/Plan 1. Bilateral impacted cerumen Bilateral TM not visualized due to cerumen impaction - Advised to Instill debrox 6.5 otic solution 5 drops into each ear twice daily x 4 days then follow up for ear lavage.May apply cotton ball at bedtime to prevent drainage to pillow. - carbamide peroxide (DEBROX) 6.5 % OTIC solution; Place 5 drops into both ears 2 (two) times daily for 4 days.  Dispense: 2 mL; Refill: 0  2. Upper respiratory tract infection, unspecified type Rhinorrhea, nasal congestion and posterior pharynx erythema noted. -Over-the-counter Tylenol as needed for pain or fever or  chills -Start on Augmentin as below - POC Influenza A/B results negative - amoxicillin-clavulanate (AUGMENTIN) 875-125 MG tablet; Take 1 tablet by mouth 2 (two) times daily for 7 days.  Dispense: 14 tablet; Refill: 0 -Advised to notify provider if symptoms worsen since wife tested positive for influenza A  Family/ staff Communication: Reviewed plan of care with patient verbalized understanding  Labs/tests ordered:  - POC Influenza A/B  Next Appointment: Return in about 5 days (around 05/26/2022) for ear lavage .   Sandrea Hughs, NP

## 2022-05-22 ENCOUNTER — Telehealth: Payer: Self-pay | Admitting: Cardiology

## 2022-05-22 ENCOUNTER — Ambulatory Visit: Payer: Medicare Other | Admitting: Physical Therapy

## 2022-05-22 DIAGNOSIS — I255 Ischemic cardiomyopathy: Secondary | ICD-10-CM

## 2022-05-22 MED ORDER — CARVEDILOL 3.125 MG PO TABS: 3.1250 mg | ORAL_TABLET | Freq: Two times a day (BID) | ORAL | 3 refills | Status: DC

## 2022-05-22 MED ORDER — LOSARTAN POTASSIUM 25 MG PO TABS: 25.0000 mg | ORAL_TABLET | Freq: Every day | ORAL | 2 refills | Status: DC

## 2022-05-22 NOTE — Telephone Encounter (Signed)
*  STAT* If patient is at the pharmacy, call can be transferred to refill team.   1. Which medications need to be refilled? (please list name of each medication and dose if known) losartan (COZAAR) 25 MG tablet  carvedilol (COREG) 3.125 MG tablet   2. Which pharmacy/location (including street and city if local pharmacy) is medication to be sent to? [facility] [place], [place]    3. Do they need a 30 day or 90 day supply? [place]

## 2022-05-22 NOTE — Telephone Encounter (Signed)
Refill sent to pharmacy.   

## 2022-05-25 ENCOUNTER — Ambulatory Visit (INDEPENDENT_AMBULATORY_CARE_PROVIDER_SITE_OTHER): Payer: Medicare Other | Admitting: Family

## 2022-05-25 ENCOUNTER — Encounter: Payer: Self-pay | Admitting: Family

## 2022-05-25 ENCOUNTER — Other Ambulatory Visit: Payer: Self-pay

## 2022-05-25 ENCOUNTER — Other Ambulatory Visit: Payer: Self-pay | Admitting: *Deleted

## 2022-05-25 VITALS — BP 132/80 | HR 67 | Temp 96.6°F | Resp 16 | Ht 62.0 in | Wt 130.6 lb

## 2022-05-25 DIAGNOSIS — H6123 Impacted cerumen, bilateral: Secondary | ICD-10-CM

## 2022-05-25 MED ORDER — CARVEDILOL 3.125 MG PO TABS: 3.1250 mg | ORAL_TABLET | Freq: Two times a day (BID) | ORAL | 3 refills | Status: DC

## 2022-05-25 NOTE — Progress Notes (Signed)
Provider: Mackenzie Groom FNP-C  [name], NP  Patient Care Team: [name], NP as PCP - General (Geriatric Medicine) Lelon Perla, MD as PCP - Cardiology (Cardiology) [name], MD as Consulting Physician (Sports Medicine) Clent Jacks, MD as Referring Physician (Ophthalmology)  Extended Emergency Contact Information Primary Emergency Contact: Tetzloff,Loretta Address: [address]          [place], [place] [address] Johnnette Litter of [place] Phone: [phone] Mobile Phone: [phone] Relation: Spouse  Code Status: Full Code  Goals of care: Advanced Directive information    05/25/2022    2:18 PM  Advanced Directives  Does Patient Have a Medical Advance Directive? No  Would patient like information on creating a medical advance directive? No - Patient declined     Chief Complaint  Patient presents with   Follow-up    5 day follow up on ear lavage for both ears.     HPI:  Pt is a 87 y.o. male seen today for an acute visit for evaluation of bilateral ear lavage. He was here 1/15/204 cerumen impaction was noted. He was advised to instil debrox 6.5 % otic solution 5 drops into each ear twice daily x 4 days then follow up today for ear ".Has used debrox as prescribed.He denies any fever,chills,URI,ear pain or drainage.has chronic Tinnititis.  Past Medical History:  Diagnosis Date   Acute gastric ulcer without mention of hemorrhage, perforation, or obstruction    Allergic rhinitis, cause unspecified    Anemia, unspecified    Arthritis    Asthma    Blood transfusion without reported diagnosis    CAD (coronary artery disease)    Diaphragmatic hernia without mention of obstruction or gangrene    Elevated prostate specific antigen (PSA)    Enlarged prostate    Esophageal reflux    Extrinsic asthma, unspecified    Herpes zoster without mention of complication    Hyperlipidemia    Hypersomnia with sleep apnea, unspecified    Impotence  of organic origin    Kyphosis    Lumbago    Obstructive sleep apnea (adult) (pediatric)    Pneumonia    hx of years ago    Senile osteoporosis    Trigger finger (acquired)    Tubular adenoma of colon 05/2013   Unspecified essential hypertension    Unspecified sleep apnea    CPAP- settings 4-12    Unspecified vitamin D deficiency    Past Surgical History:  Procedure Laterality Date   CARDIAC CATHETERIZATION  03/04/2009   Dr [name]   COLONOSCOPY     CORONARY ARTERY BYPASS GRAFT  2010   CYSTOSCOPY WITH RETROGRADE PYELOGRAM, URETEROSCOPY AND STENT PLACEMENT Right 10/04/2013   Procedure: CYSTOSCOPY WITH RETROGRADE PYELOGRAM,  AND STENT PLACEMENT;  Surgeon: Festus Aloe, MD;  Location: [facility];  Service: Urology;  Laterality: Right;   DENTAL SURGERY N/A    MOLE REMOVAL  1958   chin   ROTATOR CUFF REPAIR Left 04/1998   with bone spur removed, Dr French Ana   TONSILLECTOMY  1945   TRANSURETHRAL RESECTION OF PROSTATE N/A 09/24/2013   Procedure: TRANSURETHRAL RESECTION OF THE PROSTATE (TURP) WITH GYRUS (STAGED RIGHT LATERAL AND MEDIAN LOBE);  Surgeon: Ailene Rud, MD;  Location: [facility];  Service: Urology;  Laterality: N/A;   UPPER GI ENDOSCOPY      Allergies  Allergen Reactions   Compazine [Prochlorperazine Edisylate] Anxiety    Outpatient Encounter Medications as of 05/25/2022  Medication Sig  albuterol (VENTOLIN HFA) 108 (90 Base) MCG/ACT inhaler USE 1 TO 2 INHALATIONS BY  MOUTH EVERY 6 HOURS AS  NEEDED FOR WHEEZING OR  SHORTNESS OF BREATH   amiodarone (PACERONE) 200 MG tablet Take 1 tablet (200 mg total) by mouth daily.   amoxicillin-clavulanate (AUGMENTIN) 875-125 MG tablet Take 1 tablet by mouth 2 (two) times daily for 7 days.   Calcium Citrate-Vitamin D (EQ CALCIUM CITRATE+D3 PO) Take 1,200 mg by mouth daily.   carbamide peroxide (DEBROX) 6.5 % OTIC solution Place 5 drops into both ears 2 (two) times daily for 4 days.   carvedilol (COREG) 3.125 MG tablet Take 1  tablet (3.125 mg total) by mouth 2 (two) times daily. Please keep scheduled appointment with cardiologist   Cholecalciferol (VITAMIN D-3) 5000 units TABS Take 10,000 Units by mouth daily.   Coenzyme Q10 (COQ10) 200 MG CAPS Take 200 mg by mouth daily.   doxazosin (CARDURA) 2 MG tablet TAKE 1 TABLET BY MOUTH DAILY   empagliflozin (JARDIANCE) 10 MG TABS tablet Take 1 tablet (10 mg total) by mouth daily before breakfast.   fexofenadine (ALLEGRA) 180 MG tablet Take 180 mg by mouth daily as needed for allergies.   finasteride (PROSCAR) 5 MG tablet TAKE 1 TABLET BY MOUTH IN  THE MORNING   folic acid (FOLVITE) 1 MG tablet TAKE 1 TABLET BY MOUTH  DAILY   furosemide (LASIX) 20 MG tablet TAKE 1 TABLET BY MOUTH DAILY AS  NEEDED FOR LEG SWELLING   Iron, Ferrous Sulfate, 325 (65 Fe) MG TABS Take 325 mg by mouth 2 (two) times daily.   losartan (COZAAR) 25 MG tablet Take 1 tablet (25 mg total) by mouth daily. Please keep scheduled appointment with cardiologist   mexiletine (MEXITIL) 150 MG capsule Take 1 capsule (150 mg total) by mouth 2 (two) times daily.   OVER THE COUNTER MEDICATION Take 1-2 tablets by mouth at bedtime as needed (pain). Percogosic (tylenol with allergy medicine)   pantoprazole (PROTONIX) 40 MG tablet TAKE 1 TABLET BY MOUTH DAILY   rosuvastatin (CRESTOR) 20 MG tablet TAKE 1 TABLET BY MOUTH ONCE DAILY FOR CHOLESTEROL   warfarin (COUMADIN) 2.5 MG tablet Take 1/2 a tablet to 1 tablet by mouth daily as directed by the coumadin clinic   [DISCONTINUED] carvedilol (COREG) 3.125 MG tablet Take 1 tablet (3.125 mg total) by mouth 2 (two) times daily. Please keep scheduled appointment with cardiologist   No facility-administered encounter medications on file as of 05/25/2022.    Review of Systems  Constitutional:  Negative for appetite change, chills, fatigue, fever and unexpected weight change.  HENT:  Negative for congestion, ear discharge, ear pain, facial swelling, hearing loss, nosebleeds,  postnasal drip, rhinorrhea, sinus pressure, sinus pain, sneezing, sore throat, tinnitus and trouble swallowing.   Eyes:  Negative for pain, discharge, redness, itching and visual disturbance.  Respiratory:  Negative for cough, chest tightness, shortness of breath and wheezing.   Skin:  Negative for color change, pallor, rash and wound.  Neurological:  Negative for dizziness, light-headedness, numbness and headaches.    Immunization History  Administered Date(s) Administered   Fluad Quad(high Dose 65+) 01/25/2020, 02/20/2021, 03/16/2022   Influenza Split 01/30/2012, 02/10/2014   Influenza Whole 02/17/2009, 02/18/2013   Influenza, High Dose Seasonal PF 03/19/2018, 02/02/2019   Influenza, Quadrivalent, Recombinant, Inj, Pf 02/23/2017   Influenza,inj,Quad PF,6+ Mos 02/23/2017   Influenza-Unspecified 02/08/2014, 02/24/2015, 03/16/2016   Moderna Sars-Covid-2 Vaccination 07/21/2019, 08/18/2019, 05/03/2020, 09/28/2020, 01/11/2021   Pneumococcal Conjugate-13 04/09/2013   Pneumococcal Polysaccharide-23  10/18/2004   Td 09/29/1990   Tdap 07/25/2011   Zoster Recombinat (Shingrix) 10/09/2016, 04/06/2021, 07/04/2021   Pertinent  Health Maintenance Due  Topic Date Due   COLONOSCOPY (Pts 45-54yr Insurance coverage will need to be confirmed)  05/15/2020   INFLUENZA VACCINE  Completed      02/15/2022    4:43 PM 03/16/2022    2:33 PM 05/17/2022    5:28 PM 05/21/2022    2:54 PM 05/25/2022    2:18 PM  Fall Risk  Falls in the past year?  1  0 0  Was there an injury with Fall?  0  0 0  Fall Risk Category Calculator  1  0 0  Fall Risk Category (Retired)  Low     (RETIRED) Patient Fall Risk Level Low fall risk Low fall risk Low fall risk    Patient at Risk for Falls Due to  History of fall(s)  No Fall Risks No Fall Risks  Fall risk Follow up  Falls evaluation completed  Falls evaluation completed Falls evaluation completed   Functional Status Survey:    Vitals:   05/25/22 1425  BP: 132/80   Pulse: 67  Resp: 16  Temp: (!) 96.6 F (35.9 C)  SpO2: 94%  Weight: 130 lb 9.6 oz (59.2 kg)  Height: '5\' 2"'$  (1.575 m)   Body mass index is 23.89 kg/m. Physical Exam Vitals reviewed.  Constitutional:      General: He is not in acute distress.    Appearance: Normal appearance. He is normal weight. He is not ill-appearing or diaphoretic.  HENT:     Head: Normocephalic.     Right Ear: There is impacted cerumen.     Left Ear: There is impacted cerumen.     Ears:     Comments: Bilateral ear cerumen lavaged with warm water and hydrogen peroxide moderate amounts of cerumen obtained.No instrument used.Tolerated procedure well.TM clear without any signs of infection.     Nose: Nose normal. No congestion or rhinorrhea.     Mouth/Throat:     Mouth: Mucous membranes are moist.     Pharynx: Oropharynx is clear. No oropharyngeal exudate or posterior oropharyngeal erythema.  Eyes:     General: No scleral icterus.       Right eye: No discharge.        Left eye: No discharge.     Extraocular Movements: Extraocular movements intact.     Conjunctiva/sclera: Conjunctivae normal.     Pupils: Pupils are equal, round, and reactive to light.  Neck:     Vascular: No carotid bruit.  Cardiovascular:     Rate and Rhythm: Normal rate and regular rhythm.     Pulses: Normal pulses.     Heart sounds: Normal heart sounds. No murmur heard.    No friction rub. No gallop.  Pulmonary:     Effort: Pulmonary effort is normal. No respiratory distress.     Breath sounds: Normal breath sounds. No wheezing, rhonchi or rales.  Chest:     Chest wall: No tenderness.  Musculoskeletal:     Cervical back: Normal range of motion. No rigidity or tenderness.  Lymphadenopathy:     Cervical: No cervical adenopathy.  Skin:    General: Skin is warm and dry.     Coloration: Skin is not pale.     Findings: No bruising, erythema or rash.  Neurological:     Mental Status: He is alert.  Psychiatric:        Mood and  Affect: Mood normal.        Speech: Speech normal.        Behavior: Behavior normal.     Labs reviewed: Recent Labs    06/28/21 1505 09/08/21 1445 03/16/22 1455  NA 141 139 138  K 4.7 3.9 3.9  CL 102 106 104  CO2 '23 25 28  '$ GLUCOSE 97 101 118*  BUN '22 25 23  '$ CREATININE 1.38* 1.35* 1.33*  CALCIUM 9.4 8.8 8.5*  MG 2.2  --   --    Recent Labs    06/28/21 1505 09/08/21 1445 03/16/22 1455  AST 45* 33 29  ALT 41 35 30  ALKPHOS 69  --   --   BILITOT 0.7 0.6 0.9  PROT 6.6 6.2 6.1  ALBUMIN 4.4  --   --    Recent Labs    09/08/21 1445 12/08/21 1139 03/16/22 1455  WBC 5.2 5.3 4.9  NEUTROABS 3,411 3,636 3,474  HGB 10.7* 12.0* 12.9*  HCT 32.6* 34.4* 35.5*  MCV 84.2 93.2 95.2  PLT 191 139* 178   Lab Results  Component Value Date   TSH 1.330 06/28/2021   Lab Results  Component Value Date   HGBA1C 5.6 07/28/2012   Lab Results  Component Value Date   CHOL 122 03/16/2022   HDL 62 03/16/2022   LDLCALC 46 03/16/2022   TRIG 65 03/16/2022   CHOLHDL 2.0 03/16/2022    Significant Diagnostic Results in last 30 days:  No results found.  Assessment/Plan  Bilateral impacted cerumen Afebrile  Bilateral ear cerumen lavaged with warm water and hydrogen peroxide moderate amounts of cerumen obtained.No instrument used.Tolerated procedure well.TM clear without any signs of infection.  Family/ staff Communication: Reviewed plan of care with patient verbalized understanding  Labs/tests ordered: None   Next Appointment: Return if symptoms worsen or fail to improve.   Sandrea Hughs, NP

## 2022-05-25 NOTE — Telephone Encounter (Signed)
I have sent the refill for carvedilol 3.125 mg one tablet twice daily yo the pharmacy.

## 2022-05-29 ENCOUNTER — Ambulatory Visit: Payer: Medicare Other | Attending: Cardiology | Admitting: *Deleted

## 2022-05-29 DIAGNOSIS — I4891 Unspecified atrial fibrillation: Secondary | ICD-10-CM

## 2022-05-29 DIAGNOSIS — Z5181 Encounter for therapeutic drug level monitoring: Secondary | ICD-10-CM

## 2022-05-29 LAB — POCT INR: INR: 4.5 — AB (ref 2.0–3.0)

## 2022-05-29 NOTE — Patient Instructions (Signed)
Description   USE WARFARIN 2.'5mg'$  tablets (Mint green color). Do not take any warfarin today and no warfarin tomorrow then continue taking Warfarin 1/2 tablet daily except for 1 tablet on Monday and Friday. Recheck INR in 2 weeks.  Pt drink's boost daily. [facility]madin Clinic [phone]

## 2022-06-01 ENCOUNTER — Ambulatory Visit: Payer: Self-pay

## 2022-06-01 NOTE — Patient Instructions (Signed)
Visit Information  Thank you for taking time to visit with me today. Please don't hesitate to contact me if I can be of assistance to you.   Following are the goals we discussed today:   Goals Addressed               This Visit's Progress     Patient Stated     I would like to participate in the [facility] exercise program (pt-stated)        Care Coordination Interventions: Evaluation of current treatment plan related to impaired physical mobility and patient's adherence to plan as established by provider Placed successful outbound call to patient  Determined patient has not spoken with the PREP nurse but would like to participate Noted PCP provider placed PREP referral on 05/11/22 Sent email to [facility] RN regarding follow up with this patient to establish with PREP        Our next appointment is by telephone on 06/29/22 at 11:30 AM  Please call the care guide team at [phone] if you need to cancel or reschedule your appointment.   If you are experiencing a Mental Health or [place] or need someone to talk to, please go to [facility] [address], [place] [phone])  Patient verbalizes understanding of instructions and care plan provided today and agrees to view in [place]. Active MyChart status and patient understanding of how to access instructions and care plan via MyChart confirmed with patient.     Barb Merino, RN, BSN, CCM Care Management Coordinator [facility] Care Management  Direct Phone: [phone]

## 2022-06-01 NOTE — Patient Outreach (Signed)
  Care Coordination   Follow Up Visit Note   06/01/2022 Name: Brent Griffith MRN: [id number] DOB: 1935/05/16  Brent Griffith is a 87 y.o. year old male who sees Eubanks, Carlos American, NP for primary care. I spoke with  Brent Griffith by phone today.  What matters to the patients health and wellness today?  Patient would like to start the PREP program.     Goals Addressed               This Visit's Progress     Patient Stated     I would like to participate in the Y exercise program (pt-stated)        Care Coordination Interventions: Evaluation of current treatment plan related to impaired physical mobility and patient's adherence to plan as established by provider Placed successful outbound call to patient  Determined patient has not spoken with the PREP nurse but would like to participate Noted PCP provider placed PREP referral on 05/11/22 Sent email to [facility] RN regarding follow up with this patient to establish with PREP        SDOH assessments and interventions completed:  No     Care Coordination Interventions:  Yes, provided   Follow up plan: Follow up call scheduled for 06/29/22 '@11'$ :30 AM     Encounter Outcome:  Pt. Visit Completed

## 2022-06-12 ENCOUNTER — Ambulatory Visit: Payer: Medicare Other

## 2022-06-14 ENCOUNTER — Encounter: Payer: Self-pay | Admitting: Family

## 2022-06-15 ENCOUNTER — Ambulatory Visit: Payer: Medicare Other | Attending: Cardiovascular Disease | Admitting: *Deleted

## 2022-06-15 DIAGNOSIS — Z5181 Encounter for therapeutic drug level monitoring: Secondary | ICD-10-CM

## 2022-06-15 DIAGNOSIS — I4891 Unspecified atrial fibrillation: Secondary | ICD-10-CM

## 2022-06-15 LAB — POCT INR: POC INR: 2.7

## 2022-06-15 NOTE — Patient Instructions (Signed)
Description   USE WARFARIN 2.40m tablets (Mint green color). Continue taking Warfarin 1/2 tablet daily except for 1 tablet on Monday and Friday. Recheck INR in 3 weeks.  Pt drink's boost daily. Coumadin Clinic [phone]

## 2022-06-29 ENCOUNTER — Ambulatory Visit: Payer: Self-pay

## 2022-06-29 NOTE — Patient Outreach (Signed)
  Care Coordination   06/29/2022 Name: Brent Griffith MRN: [id number] DOB: 1935/12/13   Care Coordination Outreach Attempts:  An unsuccessful telephone outreach was attempted for a scheduled appointment today.  Follow Up Plan:  Additional outreach attempts will be made to offer the patient care coordination information and services.   Encounter Outcome:  No Answer   Care Coordination Interventions:  No, not indicated    Barb Merino, RN, BSN, CCM Care Management Coordinator [facility] Care Management  Direct Phone: [phone]

## 2022-07-03 ENCOUNTER — Telehealth (INDEPENDENT_AMBULATORY_CARE_PROVIDER_SITE_OTHER): Payer: Medicare Other | Admitting: Orthopedic Surgery

## 2022-07-03 ENCOUNTER — Encounter: Payer: Self-pay | Admitting: Orthopedic Surgery

## 2022-07-03 DIAGNOSIS — J069 Acute upper respiratory infection, unspecified: Secondary | ICD-10-CM | POA: Diagnosis not present

## 2022-07-03 MED ORDER — ALBUTEROL SULFATE HFA 108 (90 BASE) MCG/ACT IN AERS: 1.0000 | INHALATION_SPRAY | Freq: Four times a day (QID) | RESPIRATORY_TRACT | 3 refills | Status: AC | PRN

## 2022-07-03 MED ORDER — PREDNISONE 10 MG PO TABS: ORAL_TABLET | ORAL | 0 refills | Status: AC

## 2022-07-03 MED ORDER — DOXYCYCLINE HYCLATE 100 MG PO TABS: 100.0000 mg | ORAL_TABLET | Freq: Two times a day (BID) | ORAL | 0 refills | Status: AC

## 2022-07-03 NOTE — Progress Notes (Signed)
Careteam: Patient Care Team: Brent Chandler, NP as PCP - General (Geriatric Medicine) Brent Breed Denice Bors, MD as PCP - Cardiology (Cardiology) Brent Chol, MD as Consulting Physician (Sports Medicine) Brent Jacks, MD as Referring Physician (Ophthalmology)  Seen by: Brent Griffith, AGNP-C  PLACE OF SERVICE:  [place] Directive information Does Patient Have a Medical Advance Directive?: No, Would patient like information on creating a medical advance directive?: No - Patient declined  Allergies  Allergen Reactions   Compazine [Prochlorperazine Edisylate] Anxiety    Chief Complaint  Patient presents with   Acute Visit    Patient complains of coughing, fever 100.4, and runny nose.     HPI: Patient is a 87 y.o. male seen today for acute visit for ongoing cough.   01/11 covid and influenza, RSV and covid negative in ED.   01/15 he was prescribed Augmentin for right ear pain. Wife tested + for influenza A around that time.   02/25 he began to have fever 99.1, dry cough, body aches, nasal congestion, sore throat and malaise. Wife also has a fever and productive cough. He has not tested himself for covid. He has been taking Delsym for cough. Denies chest pain, sob, ear pain and malaise.   Review of Systems:  Review of Systems  Constitutional:  Positive for fever and malaise/fatigue.  HENT:  Positive for congestion and sore throat. Negative for ear pain.   Respiratory:  Positive for cough. Negative for sputum production, shortness of breath and wheezing.   Cardiovascular:  Negative for chest pain and leg swelling.  Gastrointestinal:  Negative for nausea and vomiting.  Musculoskeletal:  Positive for myalgias.  Neurological:  Negative for dizziness and headaches.  Psychiatric/Behavioral:  Negative for depression. The patient is not nervous/anxious.     Past Medical History:  Diagnosis Date   Acute gastric ulcer without mention of hemorrhage, perforation, or  obstruction    Allergic rhinitis, cause unspecified    Anemia, unspecified    Arthritis    Asthma    Blood transfusion without reported diagnosis    CAD (coronary artery disease)    Diaphragmatic hernia without mention of obstruction or gangrene    Elevated prostate specific antigen (PSA)    Enlarged prostate    Esophageal reflux    Extrinsic asthma, unspecified    Herpes zoster without mention of complication    Hyperlipidemia    Hypersomnia with sleep apnea, unspecified    Impotence of organic origin    Kyphosis    Lumbago    Obstructive sleep apnea (adult) (pediatric)    Pneumonia    hx of years ago    Senile osteoporosis    Trigger finger (acquired)    Tubular adenoma of colon 05/2013   Unspecified essential hypertension    Unspecified sleep apnea    CPAP- settings 4-12    Unspecified vitamin D deficiency    Past Surgical History:  Procedure Laterality Date   CARDIAC CATHETERIZATION  03/04/2009   Dr [name]   COLONOSCOPY     CORONARY ARTERY BYPASS GRAFT  2010   CYSTOSCOPY WITH RETROGRADE PYELOGRAM, URETEROSCOPY AND STENT PLACEMENT Right 10/04/2013   Procedure: CYSTOSCOPY WITH RETROGRADE PYELOGRAM,  AND STENT PLACEMENT;  Surgeon: Festus Aloe, MD;  Location: [facility];  Service: Urology;  Laterality: Right;   DENTAL SURGERY N/A    MOLE REMOVAL  1958   chin   ROTATOR CUFF REPAIR Left 04/1998   with bone spur removed, Dr French Ana  TONSILLECTOMY  1945   TRANSURETHRAL RESECTION OF PROSTATE N/A 09/24/2013   Procedure: TRANSURETHRAL RESECTION OF THE PROSTATE (TURP) WITH GYRUS (STAGED RIGHT LATERAL AND MEDIAN LOBE);  Surgeon: Ailene Rud, MD;  Location: [facility];  Service: Urology;  Laterality: N/A;   UPPER GI ENDOSCOPY     Social History:   reports that he has never smoked. He has never been exposed to tobacco smoke. He has never used smokeless tobacco. He reports that he does not drink alcohol and does not use drugs.  Family History  Problem Relation Age  of Onset   Stroke Mother    Breast cancer Mother    Cirrhosis Mother        wine   Rheumatic fever Mother    Kyphosis Mother    Hypertension Father    Stroke Father    Heart disease Father    Heart disease Paternal Grandfather    Colon cancer Neg Hx     Medications: Patient's Medications  New Prescriptions   No medications on file  Previous Medications   ALBUTEROL (VENTOLIN HFA) 108 (90 BASE) MCG/ACT INHALER    USE 1 TO 2 INHALATIONS BY  MOUTH EVERY 6 HOURS AS  NEEDED FOR WHEEZING OR  SHORTNESS OF BREATH   AMIODARONE (PACERONE) 200 MG TABLET    Take 1 tablet (200 mg total) by mouth daily.   CALCIUM CITRATE-VITAMIN D (EQ CALCIUM CITRATE+D3 PO)    Take 1,200 mg by mouth daily.   CARVEDILOL (COREG) 3.125 MG TABLET    Take 1 tablet (3.125 mg total) by mouth 2 (two) times daily. Please keep scheduled appointment with cardiologist   CHOLECALCIFEROL (VITAMIN D-3) 5000 UNITS TABS    Take 10,000 Units by mouth daily.   COENZYME Q10 (COQ10) 200 MG CAPS    Take 200 mg by mouth daily.   DOXAZOSIN (CARDURA) 2 MG TABLET    TAKE 1 TABLET BY MOUTH DAILY   EMPAGLIFLOZIN (JARDIANCE) 10 MG TABS TABLET    Take 1 tablet (10 mg total) by mouth daily before breakfast.   FEXOFENADINE (ALLEGRA) 180 MG TABLET    Take 180 mg by mouth daily as needed for allergies.   FINASTERIDE (PROSCAR) 5 MG TABLET    TAKE 1 TABLET BY MOUTH IN  THE MORNING   FOLIC ACID (FOLVITE) 1 MG TABLET    TAKE 1 TABLET BY MOUTH  DAILY   FUROSEMIDE (LASIX) 20 MG TABLET    TAKE 1 TABLET BY MOUTH DAILY AS  NEEDED FOR LEG SWELLING   IRON, FERROUS SULFATE, 325 (65 FE) MG TABS    Take 325 mg by mouth 2 (two) times daily.   LOSARTAN (COZAAR) 25 MG TABLET    Take 1 tablet (25 mg total) by mouth daily. Please keep scheduled appointment with cardiologist   MEXILETINE (MEXITIL) 150 MG CAPSULE    Take 1 capsule (150 mg total) by mouth 2 (two) times daily.   OVER THE COUNTER MEDICATION    Take 1-2 tablets by mouth at bedtime as needed (pain).  Percogosic (tylenol with allergy medicine)   PANTOPRAZOLE (PROTONIX) 40 MG TABLET    TAKE 1 TABLET BY MOUTH DAILY   ROSUVASTATIN (CRESTOR) 20 MG TABLET    TAKE 1 TABLET BY MOUTH ONCE DAILY FOR CHOLESTEROL   WARFARIN (COUMADIN) 2.5 MG TABLET    Take 1/2 a tablet to 1 tablet by mouth daily as directed by the coumadin clinic  Modified Medications   No medications on file  Discontinued Medications  No medications on file    Physical Exam:  There were no vitals filed for this visit. There is no height or weight on file to calculate BMI. Wt Readings from Last 3 Encounters:  05/25/22 130 lb 9.6 oz (59.2 kg)  05/21/22 131 lb 3.2 oz (59.5 kg)  05/17/22 126 lb (57.2 kg)    Physical Exam Vitals reviewed: exam limited due to virtual visit.  Constitutional:      General: He is not in acute distress. Neurological:     Mental Status: He is alert.     Labs reviewed: Basic Metabolic Panel: Recent Labs    09/08/21 1445 03/16/22 1455  NA 139 138  K 3.9 3.9  CL 106 104  CO2 25 28  GLUCOSE 101 118*  BUN 25 23  CREATININE 1.35* 1.33*  CALCIUM 8.8 8.5*   Liver Function Tests: Recent Labs    09/08/21 1445 03/16/22 1455  AST 33 29  ALT 35 30  BILITOT 0.6 0.9  PROT 6.2 6.1   No results for input(s): "LIPASE", "AMYLASE" in the last 8760 hours. No results for input(s): "AMMONIA" in the last 8760 hours. CBC: Recent Labs    09/08/21 1445 12/08/21 1139 03/16/22 1455  WBC 5.2 5.3 4.9  NEUTROABS 3,411 3,636 3,474  HGB 10.7* 12.0* 12.9*  HCT 32.6* 34.4* 35.5*  MCV 84.2 93.2 95.2  PLT 191 139* 178   Lipid Panel: Recent Labs    03/16/22 1455  Griffith 122  HDL 62  LDLCALC 46  TRIG 65  CHOLHDL 2.0   TSH: No results for input(s): "TSH" in the last 8760 hours. A1C: Lab Results  Component Value Date   HGBA1C 5.6 07/28/2012     Assessment/Plan 1. Upper respiratory tract infection, unspecified type - 01/11 negative covid, RSV, influenza - 01/15 prescribed Augmentin for  right ear pain - 02/25 fever, dry cough, nasal congestion and sore throat - recommend tylenol prn for fever - recommend antihistamine for nasal secretions> Zryrtec or Claritin - needs in person visit if no symptom improvement - albuterol (VENTOLIN HFA) 108 (90 Base) MCG/ACT inhaler; Inhale 1-2 puffs into the lungs every 6 (six) hours as needed for wheezing or shortness of breath.  Dispense: 34 g; Refill: 3 - predniSONE (DELTASONE) 10 MG tablet; Take 4 tablets (40 mg total) by mouth daily with breakfast for 1 day, THEN 3 tablets (30 mg total) daily with breakfast for 1 day, THEN 2 tablets (20 mg total) daily with breakfast for 1 day, THEN 1 tablet (10 mg total) daily with breakfast for 1 day, THEN 0.5 tablets (5 mg total) daily with breakfast for 1 day.  Dispense: 10.5 tablet; Refill: 0 - doxycycline (VIBRA-TABS) 100 MG tablet; Take 1 tablet (100 mg total) by mouth 2 (two) times daily for 7 days.  Dispense: 14 tablet; Refill: 0  Total time: 12 minutes. Greater than 50% of total time spent doing patient education regarding URI including symptom/medication management.    Virtual Visit   I connected with Brent Griffith via virtual visit and verified that I am speaking with the correct person using two identifiers.  Location: [place] Patient: Brent Griffith Provider: Yvonna Alanis, NP    I discussed the limitations, risks, security and privacy concerns of performing an evaluation and management service by telephone and the availability of in person appointments. I also discussed with the patient that there may be a patient responsible charge related to this service. The patient expressed understanding and agreed to proceed.  I discussed the assessment and treatment plan with the patient. The patient was provided an opportunity to ask questions and all were answered. The patient agreed with the plan and demonstrated an understanding of the instructions.   The patient was advised to call back  or seek an in-person evaluation if the symptoms worsen or if the condition fails to improve as anticipated.  I provided 12 face-to-face time during this encounter.  Brent Bevins Adria Dill, NP , AGNP Avs printed and mailed    Next appt: none  [place], [facility] [phone]

## 2022-07-03 NOTE — Patient Instructions (Addendum)
Please take tylenol for fever  May take mucinex if you begin to cough up phlegm  Try antihistamine like Zyrtec or Claritin to help dry out nasal secretions  Please test yourself for covid at home  Schedule in person visit if symptoms do not improve

## 2022-07-03 NOTE — Progress Notes (Signed)
   This service is provided via telemedicine  No vital signs collected/recorded due to the encounter was a telemedicine visit.   Location of patient (ex: home, work):  Home  Patient consents to a telephone visit:  Yes  Location of the provider (ex: office, home):  [employer].   Name of any referring provider:  [name], NP   Names of all persons participating in the telemedicine service and their role in the encounter:  Patient, Heriberto Antigua, [place], Windell Moulding, NP.    Time spent on call:  8 minutes spent on the phone with Medical Assistant.

## 2022-07-04 ENCOUNTER — Other Ambulatory Visit: Payer: Self-pay | Admitting: Cardiology

## 2022-07-04 DIAGNOSIS — I4891 Unspecified atrial fibrillation: Secondary | ICD-10-CM

## 2022-07-06 ENCOUNTER — Ambulatory Visit (INDEPENDENT_AMBULATORY_CARE_PROVIDER_SITE_OTHER): Payer: Medicare Other | Admitting: Nurse Practitioner

## 2022-07-06 ENCOUNTER — Encounter: Payer: Self-pay | Admitting: Pharmacist

## 2022-07-06 ENCOUNTER — Encounter: Payer: Self-pay | Admitting: Nurse Practitioner

## 2022-07-06 VITALS — BP 132/80 | HR 77 | Temp 98.7°F | Resp 18 | Ht 62.0 in | Wt 130.0 lb

## 2022-07-06 DIAGNOSIS — N1831 Chronic kidney disease, stage 3a: Secondary | ICD-10-CM

## 2022-07-06 DIAGNOSIS — I5020 Unspecified systolic (congestive) heart failure: Secondary | ICD-10-CM

## 2022-07-06 DIAGNOSIS — U071 COVID-19: Secondary | ICD-10-CM | POA: Diagnosis not present

## 2022-07-06 DIAGNOSIS — I4891 Unspecified atrial fibrillation: Secondary | ICD-10-CM

## 2022-07-06 DIAGNOSIS — N183 Chronic kidney disease, stage 3 unspecified: Secondary | ICD-10-CM | POA: Insufficient documentation

## 2022-07-06 DIAGNOSIS — K219 Gastro-esophageal reflux disease without esophagitis: Secondary | ICD-10-CM

## 2022-07-06 MED ORDER — NIRMATRELVIR/RITONAVIR (PAXLOVID) TABLET (RENAL DOSING): 2.0000 | ORAL_TABLET | Freq: Two times a day (BID) | ORAL | 0 refills | Status: DC

## 2022-07-06 NOTE — Telephone Encounter (Signed)
This encounter was created in error - please disregard.

## 2022-07-06 NOTE — Assessment & Plan Note (Signed)
tested positive COVID, persisted cough, afebrile, no O2 desaturation.   Seen 07/03/22 for URI, fever, cough, nasal congestion, sore throat, recommended antihistamine, Tylenol, on Albuterol HFA, Prednisone taper dose, Doxycyline.   Paxlovid renal dose pk.

## 2022-07-06 NOTE — Assessment & Plan Note (Signed)
Heart rate is in control, on Amiodarone, Carvedilol

## 2022-07-06 NOTE — Progress Notes (Signed)
Location:   [place]   Place of Service:   [facility] Provider: Marlana Latus NP  Code Status: DNR Goals of Care:     07/03/2022    2:29 PM  Advanced Directives  Does Patient Have a Medical Advance Directive? No  Would patient like information on creating a medical advance directive? No - Patient declined     Chief Complaint  Patient presents with   Acute Visit    positive covid infection    HPI: Patient is a 87 y.o. Griffith seen today for an acute visit for tested positive COVID  Seen 07/03/22 for URI, fever, cough, nasal congestion, sore throat, recommended antihistamine, Tylenol, on Albuterol HFA, Prednisone taper dose, Doxycyline.   Paxlovid renal dose pk.   Afib, on Amiodarone, Carvedilol  CKD, on Jardiance. Bun/creat 23/1.33 03/16/22  CHF, compensated, on Furosemide, Jardiance.   GERD, takes Pantoprazole.      Past Medical History:  Diagnosis Date   Acute gastric ulcer without mention of hemorrhage, perforation, or obstruction    Allergic rhinitis, cause unspecified    Anemia, unspecified    Arthritis    Asthma    Blood transfusion without reported diagnosis    CAD (coronary artery disease)    Diaphragmatic hernia without mention of obstruction or gangrene    Elevated prostate specific antigen (PSA)    Enlarged prostate    Esophageal reflux    Extrinsic asthma, unspecified    Herpes zoster without mention of complication    Hyperlipidemia    Hypersomnia with sleep apnea, unspecified    Impotence of organic origin    Kyphosis    Lumbago    Obstructive sleep apnea (adult) (pediatric)    Pneumonia    hx of years ago    Senile osteoporosis    Trigger finger (acquired)    Tubular adenoma of colon 05/2013   Unspecified essential hypertension    Unspecified sleep apnea    CPAP- settings 4-12    Unspecified vitamin D deficiency     Past Surgical History:  Procedure Laterality Date   CARDIAC CATHETERIZATION  03/04/2009   Dr [name]   COLONOSCOPY     CORONARY  ARTERY BYPASS GRAFT  2010   CYSTOSCOPY WITH RETROGRADE PYELOGRAM, URETEROSCOPY AND STENT PLACEMENT Right 10/04/2013   Procedure: CYSTOSCOPY WITH RETROGRADE PYELOGRAM,  AND STENT PLACEMENT;  Surgeon: Festus Aloe, MD;  Location: [facility];  Service: Urology;  Laterality: Right;   DENTAL SURGERY N/A    MOLE REMOVAL  1958   chin   ROTATOR CUFF REPAIR Left 04/1998   with bone spur removed, Dr French Ana   TONSILLECTOMY  1945   TRANSURETHRAL RESECTION OF PROSTATE N/A 09/24/2013   Procedure: TRANSURETHRAL RESECTION OF THE PROSTATE (TURP) WITH GYRUS (STAGED RIGHT LATERAL AND MEDIAN LOBE);  Surgeon: Ailene Rud, MD;  Location: [facility];  Service: Urology;  Laterality: N/A;   UPPER GI ENDOSCOPY      Allergies  Allergen Reactions   Compazine [Prochlorperazine Edisylate] Anxiety    Allergies as of 07/06/2022       Reactions   Compazine [prochlorperazine Edisylate] Anxiety        Medication List        Accurate as of July 06, 2022  4:40 PM. If you have any questions, ask your nurse or doctor.          STOP taking these medications    amiodarone 200 MG tablet Commonly known as: PACERONE Stopped by: Porchia Sinkler X Cyanna Neace, NP  TAKE these medications    albuterol 108 (90 Base) MCG/ACT inhaler Commonly known as: VENTOLIN HFA Inhale 1-2 puffs into the lungs every 6 (six) hours as needed for wheezing or shortness of breath.   carvedilol 3.125 MG tablet Commonly known as: COREG Take 1 tablet (3.125 mg total) by mouth 2 (two) times daily. Please keep scheduled appointment with cardiologist   CoQ10 200 MG Caps Take 200 mg by mouth daily.   doxazosin 2 MG tablet Commonly known as: CARDURA TAKE 1 TABLET BY MOUTH DAILY   doxycycline 100 MG tablet Commonly known as: VIBRA-TABS Take 1 tablet (100 mg total) by mouth 2 (two) times daily for 7 days.   empagliflozin 10 MG Tabs tablet Commonly known as: Jardiance Take 1 tablet (10 mg total) by mouth daily before breakfast.   EQ  CALCIUM CITRATE+D3 PO Take 1,200 mg by mouth daily.   fexofenadine 180 MG tablet Commonly known as: ALLEGRA Take 180 mg by mouth daily as needed for allergies.   finasteride 5 MG tablet Commonly known as: PROSCAR TAKE 1 TABLET BY MOUTH IN  THE MORNING   folic acid 1 MG tablet Commonly known as: FOLVITE TAKE 1 TABLET BY MOUTH  DAILY   furosemide 20 MG tablet Commonly known as: LASIX TAKE 1 TABLET BY MOUTH DAILY AS  NEEDED FOR LEG SWELLING   Iron (Ferrous Sulfate) 325 (65 Fe) MG Tabs Take 325 mg by mouth 2 (two) times daily.   losartan 25 MG tablet Commonly known as: COZAAR Take 1 tablet (25 mg total) by mouth daily. Please keep scheduled appointment with cardiologist   mexiletine 150 MG capsule Commonly known as: MEXITIL Take 1 capsule (150 mg total) by mouth 2 (two) times daily.   nirmatrelvir/ritonavir (renal dosing) 10 x 150 MG & 10 x '100MG'$  Tabs Commonly known as: PAXLOVID Take 2 tablets by mouth 2 (two) times daily for 5 days. (Take nirmatrelvir 150 mg one tablet twice daily for 5 days and ritonavir 100 mg one tablet twice daily for 5 days) Patient GFR is Brent Started by: Oluwaseun Cremer X Harel Repetto, NP   OVER THE COUNTER MEDICATION Take 1-2 tablets by mouth at bedtime as needed (pain). Percogosic (tylenol with allergy medicine)   pantoprazole 40 MG tablet Commonly known as: PROTONIX TAKE 1 TABLET BY MOUTH DAILY   predniSONE 10 MG tablet Commonly known as: DELTASONE Take 4 tablets (40 mg total) by mouth daily with breakfast for 1 day, THEN 3 tablets (30 mg total) daily with breakfast for 1 day, THEN 2 tablets (20 mg total) daily with breakfast for 1 day, THEN 1 tablet (10 mg total) daily with breakfast for 1 day, THEN 0.5 tablets (5 mg total) daily with breakfast for 1 day. Start taking on: July 03, 2022   rosuvastatin 20 MG tablet Commonly known as: CRESTOR TAKE 1 TABLET BY MOUTH ONCE DAILY FOR CHOLESTEROL   Vitamin D-3 125 MCG (5000 UT) Tabs Take 10,000 Units by mouth  daily.   warfarin 2.5 MG tablet Commonly known as: COUMADIN Take as directed by the anticoagulation clinic. If you are unsure how to take this medication, talk to your nurse or doctor. Original instructions: TAKE 1/2 TO 1 TABLET BY MOUTH  DAILY AS DIRECTED BY THE  COUMADIN CLINIC        Review of Systems:  Review of Systems  Constitutional:  Positive for appetite change and fatigue. Negative for fever.  HENT:  Negative for congestion, sore throat and trouble swallowing.   Respiratory:  Positive  for cough. Negative for chest tightness, shortness of breath and wheezing.   Cardiovascular:  Negative for chest pain, palpitations and leg swelling.  Gastrointestinal:  Negative for abdominal pain and constipation.  Genitourinary:  Positive for frequency.  Musculoskeletal:  Negative for gait problem.  Skin:  Negative for color change.  Neurological:  Negative for dizziness and headaches.  Psychiatric/Behavioral:  Negative for confusion.     Health Maintenance  Topic Date Due   COLONOSCOPY (Pts 45-96yr Insurance coverage will need to be confirmed)  05/15/2020   DTaP/Tdap/Td (3 - Td or Tdap) 07/24/2021   COVID-19 Vaccine (6 - 2023-24 season) 01/05/2022   Medicare Annual Wellness (AWV)  11/10/2022   Pneumonia Vaccine 85 Years old  Completed   INFLUENZA VACCINE  Completed   Zoster Vaccines- Shingrix  Completed   HPV VACCINES  Aged Out    Physical Exam: Vitals:   07/06/22 1552  BP: 132/80  Pulse: 77  Resp: 18  Temp: 98.7 F (37.1 C)  SpO2: 96%  Weight: 130 lb (59 kg)  Height: '5\' 2"'$  (1.575 m)   Body mass index is 23.78 kg/m. Physical Exam Vitals and nursing note reviewed.  Constitutional:      Appearance: Normal appearance.  HENT:     Head: Normocephalic.     Nose: Nose normal.     Mouth/Throat:     Mouth: Mucous membranes are moist.  Eyes:     Extraocular Movements: Extraocular movements intact.     Conjunctiva/sclera: Conjunctivae normal.     Pupils: Pupils are  equal, round, and reactive to light.  Cardiovascular:     Rate and Rhythm: Normal rate and regular rhythm.     Heart sounds: No murmur heard. Pulmonary:     Effort: Pulmonary effort is normal.     Breath sounds: No wheezing, rhonchi or rales.  Chest:     Chest wall: No tenderness.  Abdominal:     General: Bowel sounds are normal.     Palpations: Abdomen is soft.     Tenderness: There is no abdominal tenderness.  Musculoskeletal:     Cervical back: Normal range of motion and neck supple.     Right lower leg: No edema.     Left lower leg: No edema.  Skin:    General: Skin is warm and dry.  Neurological:     General: No focal deficit present.     Mental Status: He is alert and oriented to person, place, and time. Mental status is at baseline.     Gait: Gait normal.  Psychiatric:        Mood and Affect: Mood normal.        Behavior: Behavior normal.        Thought Content: Thought content normal.        Judgment: Judgment normal.     Labs reviewed: Basic Metabolic Panel: Recent Labs    09/08/21 1445 03/16/22 1455  NA 139 138  K 3.9 3.9  CL 106 104  CO2 25 28  GLUCOSE 101 118*  BUN 25 23  CREATININE 1.35* 1.33*  CALCIUM 8.8 8.5*   Liver Function Tests: Recent Labs    09/08/21 1445 03/16/22 1455  AST 33 29  ALT 35 30  BILITOT 0.6 0.9  PROT 6.2 6.1   No results for input(s): "LIPASE", "AMYLASE" in the last 8760 hours. No results for input(s): "AMMONIA" in the last 8760 hours. CBC: Recent Labs    09/08/21 1445 12/08/21 1139 03/16/22 1455  WBC  5.2 5.3 4.9  NEUTROABS 3,411 3,636 3,474  HGB 10.7* 12.0* 12.9*  HCT 32.6* 34.4* 35.5*  MCV 84.2 93.2 95.2  PLT 191 139* 178   Lipid Panel: Recent Labs    03/16/22 1455  CHOL 122  HDL 62  LDLCALC 46  TRIG 65  CHOLHDL 2.0   Lab Results  Component Value Date   HGBA1C 5.6 07/28/2012    Procedures since last visit: No results found.  Assessment/Plan COVID-19 virus infection   tested positive COVID,  persisted cough, afebrile, no O2 desaturation.   Seen 07/03/22 for URI, fever, cough, nasal congestion, sore throat, recommended antihistamine, Tylenol, on Albuterol HFA, Prednisone taper dose, Doxycyline.   Paxlovid renal dose pk.   New onset atrial fibrillation (HCC) Heart rate is in control, on Amiodarone, Carvedilol  CKD (chronic kidney disease) stage 3, GFR 30-59 ml/min (HCC) on Jardiance. Bun/creat 23/1.33 03/16/22  HFrEF (heart failure with reduced ejection fraction) (HCC)  compensated, on Furosemide, Jardiance.   GERD (gastroesophageal reflux disease) Stable, continue Pantoprazole.     Labs/tests ordered:  COVID tested positive.   Next appt:  09/14/2022

## 2022-07-06 NOTE — Assessment & Plan Note (Signed)
Stable, continue Pantoprazole.  

## 2022-07-06 NOTE — Assessment & Plan Note (Signed)
compensated, on Furosemide, Jardiance.

## 2022-07-06 NOTE — Assessment & Plan Note (Signed)
on Jardiance. Bun/creat 23/1.33 03/16/22

## 2022-07-09 ENCOUNTER — Other Ambulatory Visit: Payer: Self-pay

## 2022-07-09 ENCOUNTER — Ambulatory Visit: Payer: Medicare Other

## 2022-07-09 ENCOUNTER — Telehealth: Payer: Self-pay

## 2022-07-09 DIAGNOSIS — U071 COVID-19: Secondary | ICD-10-CM

## 2022-07-09 MED ORDER — NIRMATRELVIR/RITONAVIR (PAXLOVID) TABLET (RENAL DOSING): 2.0000 | ORAL_TABLET | Freq: Two times a day (BID) | ORAL | 0 refills | Status: AC

## 2022-07-09 NOTE — Telephone Encounter (Signed)
Patient husband wants an alternative medication instead of Paxlovid per wife.

## 2022-07-11 ENCOUNTER — Telehealth: Payer: Self-pay | Admitting: Internal Medicine

## 2022-07-11 ENCOUNTER — Telehealth: Payer: Self-pay | Admitting: Cardiology

## 2022-07-11 ENCOUNTER — Telehealth: Payer: Self-pay | Admitting: *Deleted

## 2022-07-11 NOTE — Progress Notes (Unsigned)
  Care Coordination Note  07/11/2022 Name: JAXS ERRINGTON MRN: [id number] DOB: 1936/05/05  Elwyn Lade Friedel is a 87 y.o. year old male who is a primary care patient of [name], NP and is actively engaged with the care management team. I reached out to [name] by phone today to assist with re-scheduling a follow up visit with the RN Case Manager  Follow up plan: Unsuccessful telephone outreach attempt made.   [place]  Direct Dial: [phone]

## 2022-07-11 NOTE — Telephone Encounter (Signed)
Will forward to pharm d to review

## 2022-07-11 NOTE — Telephone Encounter (Signed)
Pt c/o medication issue:  1. Name of Medication: Paxlovid   2. How are you currently taking this medication (dosage and times per day)?   3. Are you having a reaction (difficulty breathing--STAT)?   4. What is your medication issue? Pt would like a call back to make sure this medication is okay to take with his current medication.

## 2022-07-11 NOTE — Telephone Encounter (Signed)
PT's Cpap machine states "Engine life exceeded". States he uses [employer]. Please call PT. Advise [phone]  Supplies are OK.

## 2022-07-11 NOTE — Telephone Encounter (Signed)
ATC X1 someone picked up the phone and I could hear talking. I said hello several times after no response I hung up. Will call patient back later

## 2022-07-12 NOTE — Telephone Encounter (Signed)
Pt is returning call about taking his Paxlovid with his current medication. Pt did state that they test negative for covid yesterday.

## 2022-07-12 NOTE — Telephone Encounter (Signed)
Unable to leave mess-VM full. Will have to call again later

## 2022-07-12 NOTE — Telephone Encounter (Signed)
Do not recommend taking paxlovid and warfarin. Would recommend requesting Lagevrio from PCP. However, if he tested negative for Covid he likely does not need either medication.

## 2022-07-12 NOTE — Telephone Encounter (Signed)
Patient is returning call.  °

## 2022-07-12 NOTE — Telephone Encounter (Signed)
Patient returned call

## 2022-07-12 NOTE — Telephone Encounter (Signed)
Spoke with patient regarding NOT taking Paxlovid since he is on Warfarin.  He is currently negative for Covid.  Advised per recommendation ,Pharmacist Pavero, Christopher "Do not recommend taking paxlovid and warfarin. Would recommend requesting Lagevrio from PCP. However, if he tested negative for Covid he likely does not need either medication." Patient is currently negative and asymptomatic. He sates understanding and if test positive or becomes symptomatic, he will call his PCP.

## 2022-07-12 NOTE — Telephone Encounter (Signed)
Called pt, no answer. Voicemail full, unable to leave message.

## 2022-07-12 NOTE — Progress Notes (Signed)
  Care Coordination Note  07/12/2022 Name: LAURITZ TUNGATE MRN: [id number] DOB: 1935-08-05  Elwyn Lade Plass is a 87 y.o. year old male who is a primary care patient of [name], NP and is actively engaged with the care management team. I reached out to [name] by phone today to assist with re-scheduling a follow up visit with the RN Case Manager  Follow up plan: Telephone appointment with care management team member scheduled for:07/24/22  [place]: [phone]

## 2022-07-13 ENCOUNTER — Ambulatory Visit
Payer: Medicare Other | Attending: Cardiovascular Disease | Admitting: Pharmacist Clinician (PhC)/ Clinical Pharmacy Specialist

## 2022-07-13 DIAGNOSIS — I4891 Unspecified atrial fibrillation: Secondary | ICD-10-CM

## 2022-07-13 DIAGNOSIS — Z5181 Encounter for therapeutic drug level monitoring: Secondary | ICD-10-CM

## 2022-07-13 LAB — POCT INR: INR: 2.1 (ref 2.0–3.0)

## 2022-07-13 NOTE — Telephone Encounter (Signed)
Already discussed issue with another office.

## 2022-07-13 NOTE — Patient Instructions (Signed)
USE WARFARIN 2.'5mg'$  tablets (Mint green color). Continue taking Warfarin 1/2 tablet daily except for 1 tablet on Monday and Friday. Recheck INR in 3 weeks.  Pt drink's boost daily. [facility] [phone]

## 2022-07-19 NOTE — Telephone Encounter (Signed)
ATC x1, could not leave message due to mailbox being full

## 2022-07-19 NOTE — Telephone Encounter (Signed)
Spoke with pt who states C-Pap sends message of "Engine life exceeded" Pt states C-Pap is not malfunctioning in any way. Pt instructed that indicates C-Pap has been used for a long time and it C-Pap starts to malfunction at any time he should notify DME. Pt stated understanding. Nothing further needed at this time.

## 2022-07-19 NOTE — Telephone Encounter (Signed)
Patient is returning phone call. Patient phone number is [phone].

## 2022-07-23 ENCOUNTER — Other Ambulatory Visit: Payer: Self-pay | Admitting: Nurse Practitioner

## 2022-07-24 DIAGNOSIS — H9313 Tinnitus, bilateral: Secondary | ICD-10-CM | POA: Diagnosis not present

## 2022-07-24 DIAGNOSIS — H6121 Impacted cerumen, right ear: Secondary | ICD-10-CM | POA: Diagnosis not present

## 2022-07-31 ENCOUNTER — Ambulatory Visit: Payer: Self-pay

## 2022-08-01 NOTE — Patient Outreach (Signed)
  Care Coordination   Follow Up Visit Note   08/01/2022 Name: Brent Griffith MRN: [id number] DOB: 12-26-1935  Brent Griffith is a 87 y.o. year old male who sees Eubanks, Carlos American, NP for primary care. I spoke with  Brent Griffith by phone today.  What matters to the patients health and wellness today?  Patient would like to participate in the PREP program.     Goals Addressed               This Visit's Progress     Patient Stated     I would like to participate in the Y exercise program (pt-stated)        Care Coordination Interventions: Evaluation of current treatment plan related to impaired physical mobility and patient's adherence to plan as established by provider Placed successful outbound call to patient  Determined patient has not spoken with the PREP nurse but would like to participate Noted PCP provider placed PREP referral on 05/11/22 Sent email to [facility] RN regarding follow up with this patient to establish with PREP    Interventions Today    Flowsheet Row Most Recent Value  Chronic Disease   Chronic disease during today's visit Hypertension (HTN), Other  [weakness]  General Interventions   General Interventions Discussed/Reviewed General Interventions Discussed, General Interventions Reviewed, Doctor Visits  Doctor Visits Discussed/Reviewed Doctor Visits Discussed, Doctor Visits Reviewed, PCP  Exercise Interventions   Exercise Discussed/Reviewed Exercise Reviewed, Exercise Discussed, Physical Activity  Physical Activity Discussed/Reviewed Physical Activity Reviewed, Physical Activity Discussed, PREP  Education Interventions   Education Provided Provided Education  Provided Verbal Education On When to see the doctor  Pharmacy Interventions   Pharmacy Dicussed/Reviewed Pharmacy Topics Discussed, Affording Medications, Referral to Pharmacist  Referral to Pharmacist Cannot afford medications  [Jardiance]          SDOH assessments and interventions  completed:  No     Care Coordination Interventions:  Yes, provided   Follow up plan: Follow up call scheduled for 09/18/22 @1 :30 PM    Encounter Outcome:  Pt. Visit Completed

## 2022-08-01 NOTE — Patient Instructions (Signed)
Visit Information  Thank you for taking time to visit with me today. Please don't hesitate to contact me if I can be of assistance to you.   Following are the goals we discussed today:   Goals Addressed               This Visit's Progress     Patient Stated     I would like to participate in the Y exercise program (pt-stated)        Care Coordination Interventions: Evaluation of current treatment plan related to impaired physical mobility and patient's adherence to plan as established by provider Placed successful outbound call to patient  Determined patient has not spoken with the PREP nurse but would like to participate Noted PCP provider placed PREP referral on 05/11/22 Sent email to [facility] RN regarding follow up with this patient to establish with PREP        Our next appointment is by telephone on 09/18/22 at 1:30 PM   Please call the care guide team at [phone] if you need to cancel or reschedule your appointment.   If you are experiencing a Mental Health or [place] or need someone to talk to, please call 1-800-273-TALK (toll free, 24 hour hotline) go to [facility] [address] [place] [phone])  Patient verbalizes understanding of instructions and care plan provided today and agrees to view in [place]. Active MyChart status and patient understanding of how to access instructions and care plan via MyChart confirmed with patient.     Barb Merino, RN, BSN, CCM Care Management Coordinator [facility] Care Management Direct Phone: [phone]

## 2022-08-03 ENCOUNTER — Ambulatory Visit: Payer: Medicare Other | Attending: Cardiovascular Disease | Admitting: *Deleted

## 2022-08-03 DIAGNOSIS — I4891 Unspecified atrial fibrillation: Secondary | ICD-10-CM | POA: Diagnosis not present

## 2022-08-03 DIAGNOSIS — Z5181 Encounter for therapeutic drug level monitoring: Secondary | ICD-10-CM

## 2022-08-03 DIAGNOSIS — H9313 Tinnitus, bilateral: Secondary | ICD-10-CM | POA: Diagnosis not present

## 2022-08-03 DIAGNOSIS — H9193 Unspecified hearing loss, bilateral: Secondary | ICD-10-CM | POA: Diagnosis not present

## 2022-08-03 LAB — POCT INR: POC INR: 2

## 2022-08-03 NOTE — Patient Instructions (Signed)
Description   Continue taking Warfarin 1/2 tablet daily except for 1 tablet on Monday and Friday. Recheck INR in 4 weeks.  Pt drink's boost daily. Coumadin Clinic [phone]

## 2022-08-07 ENCOUNTER — Other Ambulatory Visit: Payer: Self-pay | Admitting: Cardiology

## 2022-08-17 ENCOUNTER — Other Ambulatory Visit: Payer: Self-pay | Admitting: Nurse Practitioner

## 2022-08-17 DIAGNOSIS — D649 Anemia, unspecified: Secondary | ICD-10-CM

## 2022-08-17 DIAGNOSIS — D561 Beta thalassemia: Secondary | ICD-10-CM

## 2022-08-22 ENCOUNTER — Ambulatory Visit: Payer: Medicare Other | Attending: Cardiology | Admitting: Cardiology

## 2022-08-22 ENCOUNTER — Encounter: Payer: Self-pay | Admitting: Cardiology

## 2022-08-22 VITALS — BP 142/80 | HR 51 | Ht 62.0 in | Wt 127.8 lb

## 2022-08-22 DIAGNOSIS — Z79899 Other long term (current) drug therapy: Secondary | ICD-10-CM

## 2022-08-22 DIAGNOSIS — I5022 Chronic systolic (congestive) heart failure: Secondary | ICD-10-CM

## 2022-08-22 DIAGNOSIS — I4891 Unspecified atrial fibrillation: Secondary | ICD-10-CM | POA: Diagnosis not present

## 2022-08-22 DIAGNOSIS — I493 Ventricular premature depolarization: Secondary | ICD-10-CM

## 2022-08-22 NOTE — Progress Notes (Signed)
Electrophysiology Office Follow up Visit Note:    Date:  08/22/2022   ID:  Brent Griffith, DOB Jan 01, 1936, MRN [id number]  PCP:  Sharon Seller, NP  [facility] Cardiologist:  Olga Millers, MD  [facility] Electrophysiologist:  Lanier Prude, MD    Interval History:    Brent Griffith is a 87 y.o. male who presents for a follow up visit. They were last seen by me 05/04/2021. He was maintaining sinus rhythm with a high burden of PVCs.   He saw Dr. Jens Som in April 2023.  Losartan 25 mg daily was added for his heart failure (ACEI prev stopped secondary to soft pressures).   Last seen in clinic by Juanda Crumble, PA-C 02/07/2022. He denied palpitations. He reported occasional mechanical falls and was working with PT regarding his neck strength. He was able to complete yard work without shortness of breath or chest pain.  Today, he is accompanied by a family member. He reports that his legs become fatigued with walking. However, this is attributable to recently feeling ill. He had Covid-19 last month and continues to recover. He notes that he isn't able to take the dog for as long of a walk now due to his fatigue.  Hasn't noticed any PVCs on his pulse ox for quite a while.  He denies any palpitations, chest pain, shortness of breath, or peripheral edema. No lightheadedness, headaches, syncope, orthopnea, or PND.      Past Medical History:  Diagnosis Date   Acute gastric ulcer without mention of hemorrhage, perforation, or obstruction    Allergic rhinitis, cause unspecified    Anemia, unspecified    Arthritis    Asthma    Blood transfusion without reported diagnosis    CAD (coronary artery disease)    Diaphragmatic hernia without mention of obstruction or gangrene    Elevated prostate specific antigen (PSA)    Enlarged prostate    Esophageal reflux    Extrinsic asthma, unspecified    Herpes zoster without mention of complication    Hyperlipidemia    Hypersomnia  with sleep apnea, unspecified    Impotence of organic origin    Kyphosis    Lumbago    Obstructive sleep apnea (adult) (pediatric)    Pneumonia    hx of years ago    Senile osteoporosis    Trigger finger (acquired)    Tubular adenoma of colon 05/2013   Unspecified essential hypertension    Unspecified sleep apnea    CPAP- settings 4-12    Unspecified vitamin D deficiency     Past Surgical History:  Procedure Laterality Date   CARDIAC CATHETERIZATION  03/04/2009   Dr Dorris Fetch   COLONOSCOPY     CORONARY ARTERY BYPASS GRAFT  2010   CYSTOSCOPY WITH RETROGRADE PYELOGRAM, URETEROSCOPY AND STENT PLACEMENT Right 10/04/2013   Procedure: CYSTOSCOPY WITH RETROGRADE PYELOGRAM,  AND STENT PLACEMENT;  Surgeon: Jerilee Field, MD;  Location: [facility];  Service: Urology;  Laterality: Right;   DENTAL SURGERY N/A    MOLE REMOVAL  1958   chin   ROTATOR CUFF REPAIR Left 04/1998   with bone spur removed, Dr Madelon Lips   TONSILLECTOMY  1945   TRANSURETHRAL RESECTION OF PROSTATE N/A 09/24/2013   Procedure: TRANSURETHRAL RESECTION OF THE PROSTATE (TURP) WITH GYRUS (STAGED RIGHT LATERAL AND MEDIAN LOBE);  Surgeon: Kathi Ludwig, MD;  Location: [facility];  Service: Urology;  Laterality: N/A;   UPPER GI ENDOSCOPY      Current Medications: No outpatient  medications have been marked as taking for the 08/22/22 encounter (Office Visit) with Lanier Prude, MD.     Allergies:   Compazine [prochlorperazine edisylate]   Social History   Socioeconomic History   Marital status: Married    Spouse name: Not on file   Number of children: 0   Years of education: Not on file   Highest education level: Not on file  Occupational History   Occupation: Architectural technologist [employer]  Tobacco Use   Smoking status: Never    Passive exposure: Never   Smokeless tobacco: Never  Vaping Use   Vaping Use: Never used  Substance and Sexual Activity   Alcohol use: No   Drug use: No   Sexual activity: Not  Currently  Other Topics Concern   Not on file  Social History Narrative   Not on file   Social Determinants of Health   Financial Resource Strain: Not on file  Food Insecurity: Food Insecurity Present (04/03/2022)   Hunger Vital Sign    Worried About Running Out of Food in the Last Year: Sometimes true    Ran Out of Food in the Last Year: Sometimes true  Transportation Needs: No Transportation Needs (04/13/2022)   PRAPARE - Administrator, Civil Service (Medical): No    Lack of Transportation (Non-Medical): No  Physical Activity: Not on file  Stress: Not on file  Social Connections: Not on file     Family History: The patient's family history includes Breast cancer in his mother; Cirrhosis in his mother; Heart disease in his father and paternal grandfather; Hypertension in his father; Kyphosis in his mother; Rheumatic fever in his mother; Stroke in his father and mother. There is no history of Colon cancer.  ROS:   Please see the history of present illness.    (+) Leg discomfort/fatigue All other systems reviewed and are negative.  EKGs/Labs/Other Studies Reviewed:    The following studies were reviewed today:   EKG:  EKG is personally reviewed.  08/22/2022: Sinus rhythm.  No PVCs.  Recent Labs: 03/16/2022: ALT 30; BUN 23; Creat 1.33; Hemoglobin 12.9; Platelets 178; Potassium 3.9; Sodium 138   Recent Lipid Panel    Component Value Date/Time   CHOL 122 03/16/2022 1455   CHOL 111 07/04/2015 0811   TRIG 65 03/16/2022 1455   HDL 62 03/16/2022 1455   HDL 52 07/04/2015 0811   CHOLHDL 2.0 03/16/2022 1455   VLDL 12 10/05/2016 0812   LDLCALC 46 03/16/2022 1455    Physical Exam:    VS:  BP (!) 142/80   Pulse (!) 51   Ht  (1.575 m)   Wt 127 lb 12.8 oz (58 kg)   SpO2 97%   BMI 23.37 kg/m     Wt Readings from Last 3 Encounters:  08/22/22 127 lb 12.8 oz (58 kg)  07/06/22 130 lb (59 kg)  05/25/22 130 lb 9.6 oz (59.2 kg)     GEN: Well nourished,  well developed in no acute distress CARDIAC: RRR, no murmurs, rubs, gallops PSYCHIATRIC:  Normal affect        ASSESSMENT:    1. PVC's (premature ventricular contractions)   2. Atrial fibrillation, unspecified type   3. Chronic systolic congestive heart failure   4. Encounter for long-term (current) use of high-risk medication    PLAN:    In order of problems listed above:  #Frequent PVCs #High risk med monitoring-amiodarone and mexiletine  Seemingly resolved on medical therapy.  Continue  Coreg, amiodarone 200 mg by mouth once daily. Will repeat CMP, TSH and free T4 today  #Chronic systolic heart failure NYHA class II-III.  Warm and dry on exam today.  Continue current medical therapy.  PVC suppression as above.  Follow-up in 6 months with APP.    Medication Adjustments/Labs and Tests Ordered: Current medicines are reviewed at length with the patient today.  Concerns regarding medicines are outlined above.   Orders Placed This Encounter  Procedures   Comp Met (CMET)   TSH   T4, free   EKG 12-Lead   No orders of the defined types were placed in this encounter.   I,Mathew Stumpf,acting as a Neurosurgeon for Lanier Prude, MD.,have documented all relevant documentation on the behalf of Lanier Prude, MD,as directed by  Lanier Prude, MD while in the presence of Lanier Prude, MD.  I, Lanier Prude, MD, have reviewed all documentation for this visit. The documentation on 08/22/22 for the exam, diagnosis, procedures, and orders are all accurate and complete.  Signed, Steffanie Dunn, MD, [facility], [facility] 08/22/2022 9:10 PM    Electrophysiology [facility]

## 2022-08-22 NOTE — Patient Instructions (Addendum)
Medication Instructions:  Your physician recommends that you continue on your current medications as directed. Please refer to the Current Medication list given to you today.  *If you need a refill on your cardiac medications before your next appointment, please call your pharmacy*  Lab Work: You will need to return to have a CMP, TSH, and Free T4 drawn at [facility] on [address] on Friday, 08/31/2022.   Testing/Procedures: None ordered.  Follow-Up: At [facility], you and your health needs are our priority.  As part of our continuing mission to provide you with exceptional heart care, we have created designated Provider Care Teams.  These Care Teams include your primary Cardiologist (physician) and Advanced Practice Providers (APPs -  Physician Assistants and Nurse Practitioners) who all work together to provide you with the care you need, when you need it.  Your next appointment:   Please schedule a 6 month follow up appointment with an EP APP per Dr. Lalla Brothers.   The format for your next appointment:   In Person  Provider:   Steffanie Dunn, MD{or one of the following Advanced Practice Providers on your designated Care Team:

## 2022-08-31 ENCOUNTER — Ambulatory Visit: Payer: Medicare Other

## 2022-08-31 ENCOUNTER — Ambulatory Visit: Payer: Medicare Other | Attending: Cardiology | Admitting: *Deleted

## 2022-08-31 DIAGNOSIS — I4891 Unspecified atrial fibrillation: Secondary | ICD-10-CM | POA: Diagnosis not present

## 2022-08-31 DIAGNOSIS — I493 Ventricular premature depolarization: Secondary | ICD-10-CM

## 2022-08-31 DIAGNOSIS — Z5181 Encounter for therapeutic drug level monitoring: Secondary | ICD-10-CM

## 2022-08-31 LAB — POCT INR: POC INR: 2.2

## 2022-08-31 NOTE — Patient Instructions (Signed)
Description   Continue taking Warfarin 1/2 tablet daily except for 1 tablet on Monday and Friday. Recheck INR in 5 weeks.  Pt drink's boost daily. Coumadin Clinic [phone]

## 2022-09-01 LAB — COMPREHENSIVE METABOLIC PANEL
ALT: 23 IU/L (ref 0–44)
AST: 27 IU/L (ref 0–40)
Albumin/Globulin Ratio: 2.2 (ref 1.2–2.2)
Albumin: 4 g/dL (ref 3.7–4.7)
Alkaline Phosphatase: 59 IU/L (ref 44–121)
BUN/Creatinine Ratio: 22 (ref 10–24)
BUN: 25 mg/dL (ref 8–27)
Bilirubin Total: 1 mg/dL (ref 0.0–1.2)
CO2: 23 mmol/L (ref 20–29)
Calcium: 8.8 mg/dL (ref 8.6–10.2)
Chloride: 103 mmol/L (ref 96–106)
Creatinine, Ser: 1.16 mg/dL (ref 0.76–1.27)
Globulin, Total: 1.8 g/dL (ref 1.5–4.5)
Glucose: 128 mg/dL — ABNORMAL HIGH (ref 70–99)
Potassium: 3.9 mmol/L (ref 3.5–5.2)
Sodium: 138 mmol/L (ref 134–144)
Total Protein: 5.8 g/dL — ABNORMAL LOW (ref 6.0–8.5)
eGFR: 62 mL/min/{1.73_m2} (ref >59)

## 2022-09-01 LAB — T4, FREE: Free T4: 1.85 ng/dL — ABNORMAL HIGH (ref 0.82–1.77)

## 2022-09-01 LAB — TSH: TSH: 1.2 u[IU]/mL (ref 0.450–4.500)

## 2022-09-07 ENCOUNTER — Telehealth: Payer: Self-pay

## 2022-09-07 ENCOUNTER — Encounter: Payer: Self-pay | Admitting: Family

## 2022-09-07 NOTE — Telephone Encounter (Signed)
Attempted to reach patient reference PREP class. Unable to leave a message as mailbox was full. No other number listed.

## 2022-09-11 IMAGING — DX DG CHEST 1V PORT
1 series · 1 of 1 positions shown · non-contrast
Comparison: January 18, 2021

CLINICAL DATA: COVID positive with shortness of breath and cough.

EXAM:
PORTABLE CHEST 1 VIEW

[chest ap]
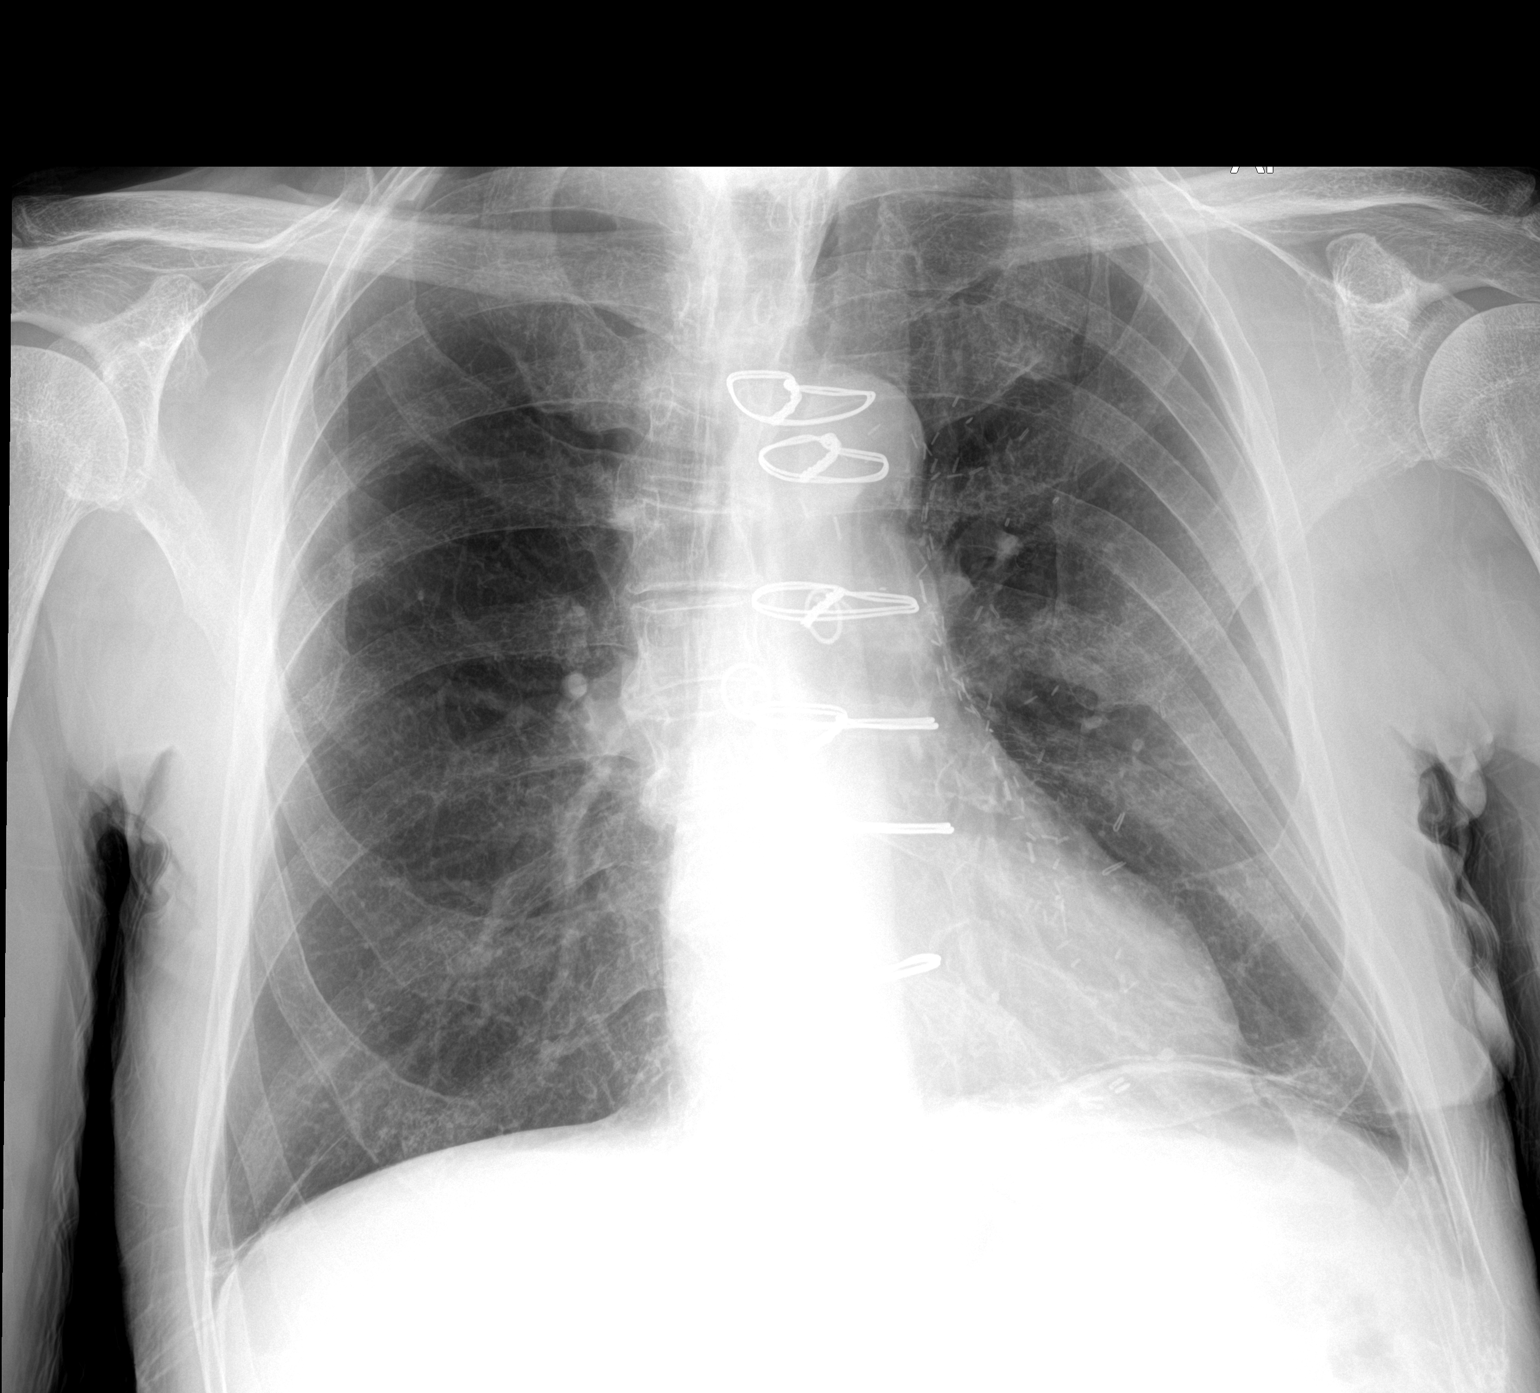

[1 of 1 positions shown; findings below may reference images not displayed]

FINDINGS: Multiple sternal wires and vascular clips are seen. There is no
evidence of acute infiltrate, pleural effusion or pneumothorax. The
heart size and mediastinal contours are within normal limits. The
visualized skeletal structures are unremarkable.
IMPRESSION: Evidence of prior median sternotomy/CABG without acute
cardiopulmonary disease.

## 2022-09-12 ENCOUNTER — Emergency Department (HOSPITAL_BASED_OUTPATIENT_CLINIC_OR_DEPARTMENT_OTHER): Payer: Medicare Other

## 2022-09-12 ENCOUNTER — Emergency Department (HOSPITAL_BASED_OUTPATIENT_CLINIC_OR_DEPARTMENT_OTHER)
Admission: EM | Admit: 2022-09-12 | Discharge: 2022-09-12 | Disposition: A | Discharge status: Home / Self Care | Payer: Medicare Other | Attending: Emergency Medicine | Admitting: Emergency Medicine

## 2022-09-12 ENCOUNTER — Encounter (HOSPITAL_BASED_OUTPATIENT_CLINIC_OR_DEPARTMENT_OTHER): Payer: Self-pay | Admitting: Emergency Medicine

## 2022-09-12 ENCOUNTER — Other Ambulatory Visit: Payer: Self-pay

## 2022-09-12 DIAGNOSIS — S0001XA Abrasion of scalp, initial encounter: Secondary | ICD-10-CM | POA: Insufficient documentation

## 2022-09-12 DIAGNOSIS — R519 Headache, unspecified: Secondary | ICD-10-CM | POA: Insufficient documentation

## 2022-09-12 DIAGNOSIS — Z7901 Long term (current) use of anticoagulants: Secondary | ICD-10-CM | POA: Diagnosis not present

## 2022-09-12 DIAGNOSIS — M79642 Pain in left hand: Secondary | ICD-10-CM | POA: Diagnosis not present

## 2022-09-12 DIAGNOSIS — S0990XA Unspecified injury of head, initial encounter: Secondary | ICD-10-CM

## 2022-09-12 DIAGNOSIS — W010XXA Fall on same level from slipping, tripping and stumbling without subsequent striking against object, initial encounter: Secondary | ICD-10-CM | POA: Diagnosis not present

## 2022-09-12 DIAGNOSIS — S6992XA Unspecified injury of left wrist, hand and finger(s), initial encounter: Secondary | ICD-10-CM | POA: Diagnosis present

## 2022-09-12 DIAGNOSIS — S60222A Contusion of left hand, initial encounter: Secondary | ICD-10-CM | POA: Insufficient documentation

## 2022-09-12 NOTE — Discharge Instructions (Signed)
Please read and follow all provided instructions.  Your diagnoses today include:  1. Minor head injury, initial encounter   2. Contusion of left hand, initial encounter     Tests performed today include: CT scan of your head that did not show any serious injury. X-ray of your hand, did not show any broken bones Vital signs. See below for your results today.   Medications prescribed:  None  Take any prescribed medications only as directed.  Home care instructions:  Follow any educational materials contained in this packet.  BE VERY CAREFUL not to take multiple medicines containing Tylenol (also called acetaminophen). Doing so can lead to an overdose which can damage your liver and cause liver failure and possibly death.   Follow-up instructions: Please follow-up with your primary care provider as needed for further evaluation of your symptoms.   Return instructions:  SEEK IMMEDIATE MEDICAL ATTENTION IF: There is confusion or drowsiness (although children frequently become drowsy after injury).  You cannot awaken the injured person.  You have more than one episode of vomiting.  You notice dizziness or unsteadiness which is getting worse, or inability to walk.  You have convulsions or unconsciousness.  You experience severe, persistent headaches not relieved by Tylenol. You cannot use arms or legs normally.  There are changes in pupil sizes. (This is the black center in the colored part of the eye)  There is clear or bloody discharge from the nose or ears.  You have change in speech, vision, swallowing, or understanding.  Localized weakness, numbness, tingling, or change in bowel or bladder control. You have any other emergent concerns.  Additional Information: You have had a head injury which does not appear to require admission at this time.  Your vital signs today were: BP 139/63 (BP Location: Right Arm)   Pulse (!) 56   Temp 97.8 F (36.6 C)   Resp 20   Ht 5\' 2"   (1.575 m)   Wt 55.3 kg   SpO2 99%   BMI 22.31 kg/m  If your blood pressure (BP) was elevated above 135/85 this visit, please have this repeated by your doctor within one month. --------------

## 2022-09-12 NOTE — ED Provider Notes (Signed)
[place] [facility] AT [facility] Provider Note   CSN: [id number] Arrival date & time: 09/12/22  1251     History  Chief Complaint  Patient presents with   Head Injury    Brent Griffith is a 87 y.o. male.  Patient with history of warfarin use, states that last INR several days ago was 2.3 --presents to the emergency department for evaluation of head injury.  Patient states that he tripped over a curb around 9:30 AM today.  He fell and struck the left parietal area and sustained a small hematoma.  He also sustained some abrasions and pain in his left hand.  He denies losing consciousness.  He denies neck pain, weakness, numbness or tingling in the arms of the legs.  He is ambulatory without any difficulties.  He denies hip pain.  Patient states that he came in to be checked due to his concurrent anticoagulation use.  States that otherwise he feels like his normal self.  He has not had any vomiting, vision change or confusion.       Home Medications Prior to Admission medications   Medication Sig Start Date End Date Taking? Authorizing Provider  albuterol (VENTOLIN HFA) 108 (90 Base) MCG/ACT inhaler Inhale 1-2 puffs into the lungs every 6 (six) hours as needed for wheezing or shortness of breath. 07/03/22   Octavia Heir, NP  amiodarone (PACERONE) 200 MG tablet TAKE 1 TABLET BY MOUTH DAILY 08/07/22   Lewayne Bunting, MD  Calcium Citrate-Vitamin D (EQ CALCIUM CITRATE+D3 PO) Take 1,200 mg by mouth daily.    [provider]  carvedilol (COREG) 3.125 MG tablet Take 1 tablet (3.125 mg total) by mouth 2 (two) times daily. Please keep scheduled appointment with cardiologist 05/25/22   Lewayne Bunting, MD  Cholecalciferol (VITAMIN D-3) 5000 units TABS Take 10,000 Units by mouth daily.    [provider]  Coenzyme Q10 (COQ10) 200 MG CAPS Take 200 mg by mouth daily.    [provider]  doxazosin (CARDURA) 2 MG tablet TAKE 1 TABLET BY MOUTH DAILY  03/27/22   Lewayne Bunting, MD  empagliflozin (JARDIANCE) 10 MG TABS tablet Take 1 tablet (10 mg total) by mouth daily before breakfast. 04/16/22   Lewayne Bunting, MD  fexofenadine (ALLEGRA) 180 MG tablet Take 180 mg by mouth daily as needed for allergies.    [provider]  finasteride (PROSCAR) 5 MG tablet TAKE 1 TABLET BY MOUTH IN  THE MORNING 10/06/21   Sharon Seller, NP  folic acid (FOLVITE) 1 MG tablet TAKE 1 TABLET BY MOUTH DAILY 08/20/22   Sharon Seller, NP  furosemide (LASIX) 20 MG tablet TAKE 1 TABLET BY MOUTH DAILY AS  NEEDED FOR LEG SWELLING 05/01/22   Lewayne Bunting, MD  Iron, Ferrous Sulfate, 325 (65 Fe) MG TABS Take 325 mg by mouth 2 (two) times daily. 09/13/21   Sharon Seller, NP  losartan (COZAAR) 25 MG tablet Take 1 tablet (25 mg total) by mouth daily. Please keep scheduled appointment with cardiologist 05/22/22   Lewayne Bunting, MD  mexiletine (MEXITIL) 150 MG capsule Take 1 capsule (150 mg total) by mouth 2 (two) times daily. 02/19/22   Lanier Prude, MD  pantoprazole (PROTONIX) 40 MG tablet TAKE 1 TABLET BY MOUTH DAILY 04/23/22   Sharon Seller, NP  rosuvastatin (CRESTOR) 20 MG tablet TAKE 1 TABLET BY MOUTH ONCE  DAILY FOR CHOLESTEROL 07/24/22   Sharon Seller,  NP  warfarin (COUMADIN) 2.5 MG tablet TAKE 1/2 TO 1 TABLET BY MOUTH  DAILY AS DIRECTED BY THE  COUMADIN CLINIC 07/04/22   Lewayne Bunting, MD      Allergies    Compazine [prochlorperazine edisylate]    Review of Systems   Review of Systems  Physical Exam Updated Vital Signs BP 139/63 (BP Location: Right Arm)   Pulse (!) 56   Temp 97.8 F (36.6 C)   Resp 20   Ht 5\' 2"  (1.575 m)   Wt 55.3 kg   SpO2 99%   BMI 22.31 kg/m  Physical Exam Vitals and nursing note reviewed.  Constitutional:      Appearance: He is well-developed.  HENT:     Head: Normocephalic. No raccoon eyes or Battle's sign.     Comments: Patient with small abrasion of the left parietal scalp with  slight swelling, no depressions.    Right Ear: Tympanic membrane, ear canal and external ear normal. No hemotympanum.     Left Ear: Tympanic membrane, ear canal and external ear normal. No hemotympanum.     Nose: Nose normal.  Eyes:     General: Lids are normal.     Conjunctiva/sclera: Conjunctivae normal.     Pupils: Pupils are equal, round, and reactive to light.     Comments: No visible hyphema  Neck:     Comments: No tenderness to palpation over the cervical spine.  Patient does have a degree of kyphosis which prevents full extension of the neck, but no pain within his ability to flex and extend, rotate and laterally bend. Cardiovascular:     Rate and Rhythm: Normal rate and regular rhythm.  Pulmonary:     Effort: Pulmonary effort is normal.     Breath sounds: Normal breath sounds.  Abdominal:     Palpations: Abdomen is soft.     Tenderness: There is no abdominal tenderness.  Musculoskeletal:        General: Normal range of motion.     Cervical back: Normal range of motion and neck supple. No tenderness or bony tenderness.     Thoracic back: No tenderness or bony tenderness.     Lumbar back: No tenderness or bony tenderness.  Skin:    General: Skin is warm and dry.  Neurological:     Mental Status: He is alert and oriented to person, place, and time.     GCS: GCS eye subscore is 4. GCS verbal subscore is 5. GCS motor subscore is 6.     Cranial Nerves: No cranial nerve deficit.     Sensory: No sensory deficit.     Coordination: Coordination normal.     ED Results / Procedures / Treatments   Labs (all labs ordered are listed, but only abnormal results are displayed) Labs Reviewed - No data to display  EKG None  Radiology No results found.  Procedures Procedures    Medications Ordered in ED Medications - No data to display  ED Course/ Medical Decision Making/ A&P    Patient seen and examined. History obtained directly from patient.   Labs/EKG: None  ordered  Imaging: Ordered CT head, x-ray of the hand.  Medications/Fluids: None ordered  Most recent vital signs reviewed and are as follows: BP 139/63 (BP Location: Right Arm)   Pulse (!) 56   Temp 97.8 F (36.6 C)   Resp 20   Ht 5\' 2"  (1.575 m)   Wt 55.3 kg   SpO2 99%   BMI  22.31 kg/m   Initial impression: Minor head injury, high risk however due to anticoagulation and age.  Imaging pending.  Based on exam, I have low concern for cervical spine injury.  Patient is fully oriented and denies any pain or tenderness to palpation of the neck.   2:24 PM Reassessment performed. Patient appears stable.  No signs of neurologic decompensation.  Imaging personally visualized and interpreted including: CT head agree negative for acute injury or bleeding; x-ray of hand agree negative.  Reviewed pertinent lab work and imaging with patient at bedside. Questions answered.   Most current vital signs reviewed and are as follows: BP 139/63 (BP Location: Right Arm)   Pulse (!) 56   Temp 97.8 F (36.6 C)   Resp 20   Ht 5\' 2"  (1.575 m)   Wt 55.3 kg   SpO2 99%   BMI 22.31 kg/m   Plan: Discharge to home.   Prescriptions written for: None  Other home care instructions discussed: Wound care, Tylenol for hand pain.  ED return instructions discussed: Patient was counseled on head injury precautions and symptoms that should indicate their return to the ED.  These include severe worsening headache, vision changes, confusion, loss of consciousness, trouble walking, nausea & vomiting, or weakness/tingling in extremities.    Follow-up instructions discussed: Patient encouraged to follow-up with their PCP as needed.                            Medical Decision Making Amount and/or Complexity of Data Reviewed Radiology: ordered.   Patient with minor head injury, high risk due to age and anticoagulation status.  CT of the head was negative.  Patient does not have any difficulties with range of  motion of the neck or neck pain I have low concern for cervical spine fracture.  He is ambulatory and I have low concern for pelvis or hip fracture.  X-ray of the hand was negative for fracture.  He looks well.  Exam unchanged without decompensation during ED stay.        Final Clinical Impression(s) / ED Diagnoses Final diagnoses:  Minor head injury, initial encounter  Contusion of left hand, initial encounter    Rx / DC Orders ED Discharge Orders     None         Renne Crigler, PA-C 09/12/22 1425    Lonell Grandchild, MD 09/13/22 [phone]

## 2022-09-12 NOTE — ED Triage Notes (Addendum)
Pt fell this morning, hitting LT anterior head on concrete; denies LOC, + thinners; also c/o LT hand pain

## 2022-09-14 ENCOUNTER — Encounter: Payer: Self-pay | Admitting: Nurse Practitioner

## 2022-09-14 ENCOUNTER — Ambulatory Visit (INDEPENDENT_AMBULATORY_CARE_PROVIDER_SITE_OTHER): Payer: Medicare Other | Admitting: Nurse Practitioner

## 2022-09-14 VITALS — BP 114/58 | HR 64 | Temp 97.3°F | Ht 62.0 in | Wt 126.6 lb

## 2022-09-14 DIAGNOSIS — I4891 Unspecified atrial fibrillation: Secondary | ICD-10-CM | POA: Diagnosis not present

## 2022-09-14 DIAGNOSIS — W19XXXD Unspecified fall, subsequent encounter: Secondary | ICD-10-CM

## 2022-09-14 DIAGNOSIS — I2510 Atherosclerotic heart disease of native coronary artery without angina pectoris: Secondary | ICD-10-CM | POA: Diagnosis not present

## 2022-09-14 DIAGNOSIS — R634 Abnormal weight loss: Secondary | ICD-10-CM | POA: Diagnosis not present

## 2022-09-14 DIAGNOSIS — R3912 Poor urinary stream: Secondary | ICD-10-CM | POA: Diagnosis not present

## 2022-09-14 DIAGNOSIS — N1831 Chronic kidney disease, stage 3a: Secondary | ICD-10-CM | POA: Diagnosis not present

## 2022-09-14 DIAGNOSIS — D508 Other iron deficiency anemias: Secondary | ICD-10-CM | POA: Diagnosis not present

## 2022-09-14 DIAGNOSIS — K219 Gastro-esophageal reflux disease without esophagitis: Secondary | ICD-10-CM

## 2022-09-14 DIAGNOSIS — I10 Essential (primary) hypertension: Secondary | ICD-10-CM

## 2022-09-14 DIAGNOSIS — M40203 Unspecified kyphosis, cervicothoracic region: Secondary | ICD-10-CM | POA: Diagnosis not present

## 2022-09-14 DIAGNOSIS — N401 Enlarged prostate with lower urinary tract symptoms: Secondary | ICD-10-CM | POA: Diagnosis not present

## 2022-09-14 LAB — CBC WITH DIFFERENTIAL/PLATELET
Absolute Monocytes: 461 cells/uL (ref 200–950)
Basophils Absolute: 10 cells/uL (ref 0–200)
Basophils Relative: 0.2 %
Eosinophils Absolute: 59 cells/uL (ref 15–500)
Eosinophils Relative: 1.2 %
HCT: 36 % — ABNORMAL LOW (ref 38.5–50.0)
Hemoglobin: 12.2 g/dL — ABNORMAL LOW (ref 13.2–17.1)
Lymphs Abs: 823 cells/uL — ABNORMAL LOW (ref 850–3900)
MCH: 32.1 pg (ref 27.0–33.0)
MCHC: 33.9 g/dL (ref 32.0–36.0)
MCV: 94.7 fL (ref 80.0–100.0)
MPV: 11 fL (ref 7.5–12.5)
Monocytes Relative: 9.4 %
Neutro Abs: 3548 cells/uL (ref 1500–7800)
Neutrophils Relative %: 72.4 %
Platelets: 161 10*3/uL (ref 140–400)
RBC: 3.8 10*6/uL — ABNORMAL LOW (ref 4.20–5.80)
RDW: 12.1 % (ref 11.0–15.0)
Total Lymphocyte: 16.8 %
WBC: 4.9 10*3/uL (ref 3.8–10.8)

## 2022-09-14 IMAGING — DX DG CHEST 1V PORT
1 series · 1 of 1 positions shown · non-contrast
Comparison: 01/27/2021

CLINICAL DATA: Short of breath, 2IQD3-9F diagnosed 01/14/2021

EXAM:
PORTABLE CHEST 1 VIEW

[chest ap]
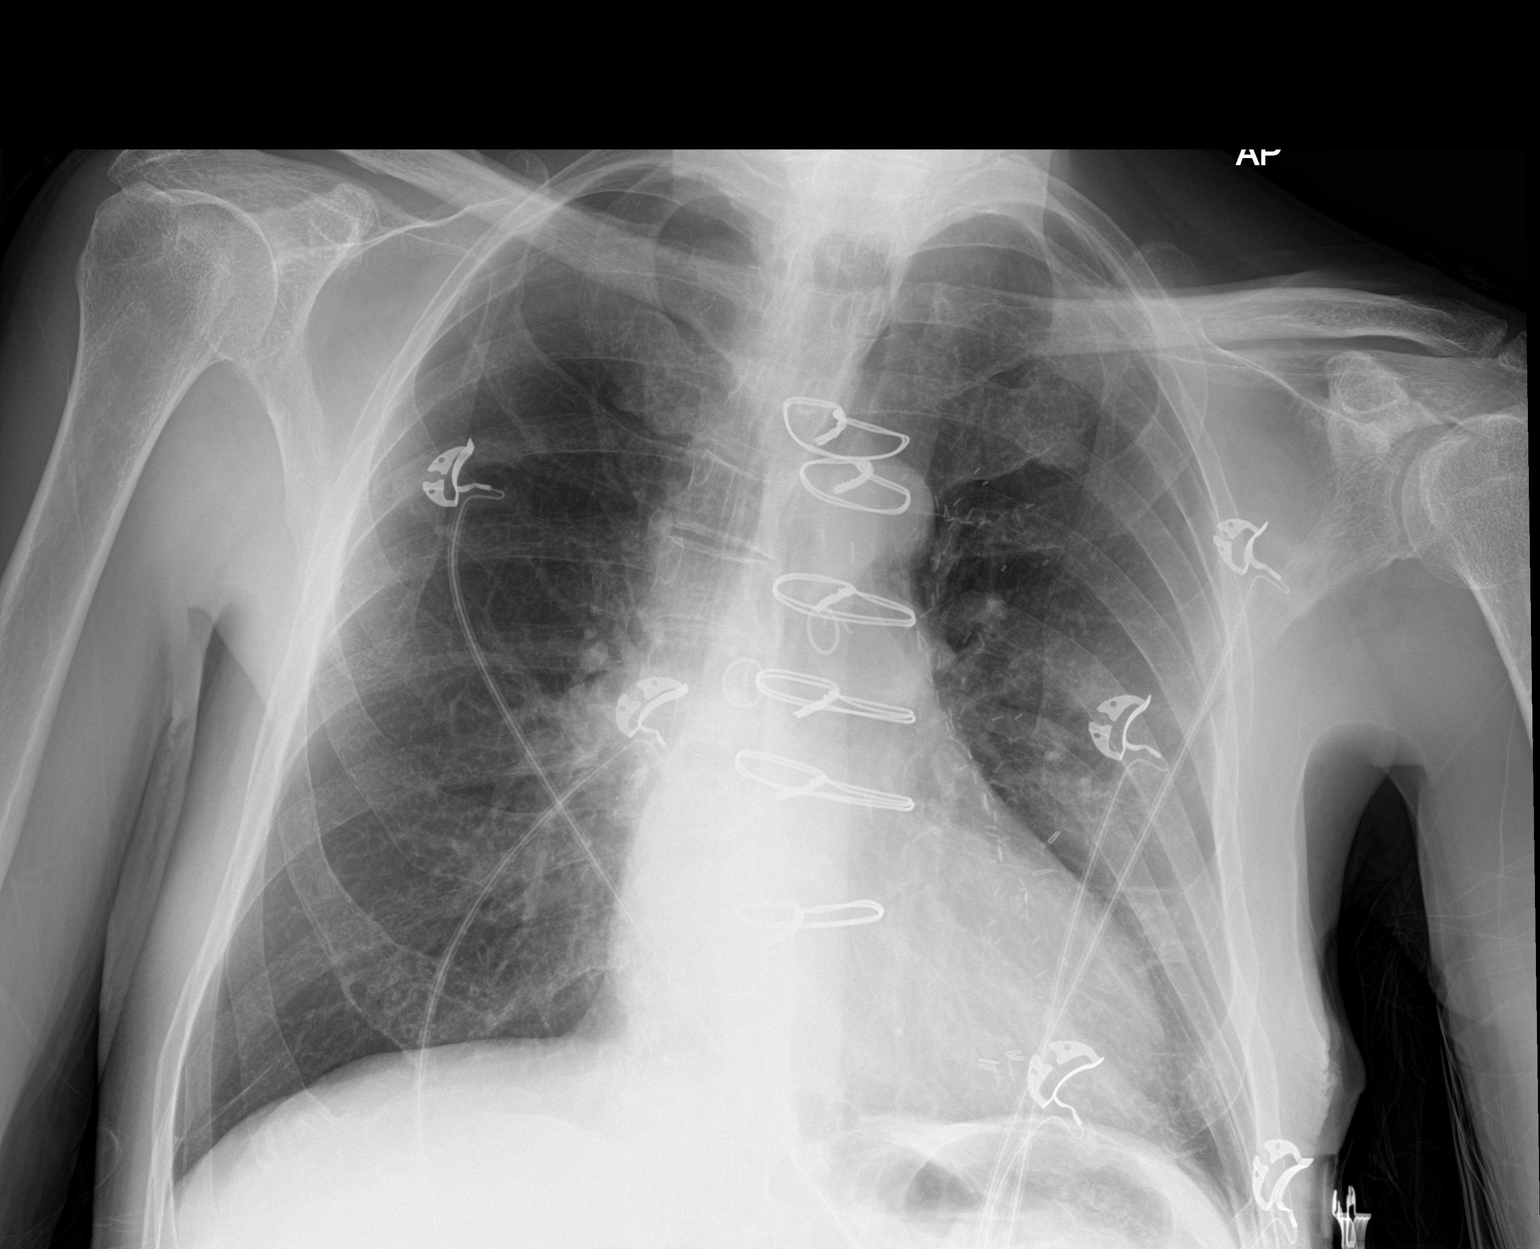

[1 of 1 positions shown; findings below may reference images not displayed]

FINDINGS: Single frontal view of the chest demonstrates postsurgical changes
from CABG. Cardiac silhouette is unremarkable. No airspace disease,
effusion, or pneumothorax.
IMPRESSION: 1. Stable chest, no acute process.

## 2022-09-14 NOTE — Patient Instructions (Signed)
Call pharmacy and tell them to stop folic acid prescription.

## 2022-09-14 NOTE — Progress Notes (Unsigned)
Careteam: Patient Care Team: Sharon Seller, NP as PCP - General (Geriatric Medicine) Lewayne Bunting, MD as PCP - Cardiology (Cardiology) Lanier Prude, MD as PCP - Electrophysiology (Cardiology) Melina Fiddler, MD as Consulting Physician (Sports Medicine) Ernesto Rutherford, MD as Referring Physician (Ophthalmology)  PLACE OF SERVICE:  [facility]  Advanced Directive information    Allergies  Allergen Reactions   Compazine [Prochlorperazine Edisylate] Anxiety    Chief Complaint  Patient presents with   Medical Management of Chronic Issues    6 month follow-up. Discuss need for colonoscopy, td/tdap, and covid boosters. AWV is pending. Patient denies receiving any vaccines since last visit.       HPI: Patient is a 87 y.o. male for follow up  Pt went to ED 2 days ago after fall. Did not see the curb and Hit head, wrist., shoulder and knee.  No fractures noted. Still tender but symptoms improving. Swelling and pain better.  Reports he does not have a problem with walking generally, does not feel like he needs cane or walker for stability.   On coumadin and followed by coumadin clinic.   Followed by cardiology due to A fib and CHF, evolemic. Rate controlled, no palpitations.   GERD- controlled on protonix- has been off for a few days and having more acid in the morning.   Anemia- on iron twice daily  Unsure why he is on folic acid- has been taking to as far back as 2009 in epic.  Review of Systems:  Review of Systems  Constitutional:  Negative for chills, fever and weight loss.  HENT:  Negative for tinnitus.   Respiratory:  Negative for cough, sputum production and shortness of breath.   Cardiovascular:  Negative for chest pain, palpitations and leg swelling.  Gastrointestinal:  Negative for abdominal pain, constipation, diarrhea and heartburn.  Genitourinary:  Negative for dysuria, frequency and urgency.  Musculoskeletal:  Negative for back pain, falls,  joint pain and myalgias.  Skin: Negative.   Neurological:  Negative for dizziness and headaches.  Psychiatric/Behavioral:  Negative for depression and memory loss. The patient does not have insomnia.     Past Medical History:  Diagnosis Date   Acute gastric ulcer without mention of hemorrhage, perforation, or obstruction    Allergic rhinitis, cause unspecified    Anemia, unspecified    Arthritis    Asthma    Blood transfusion without reported diagnosis    CAD (coronary artery disease)    Diaphragmatic hernia without mention of obstruction or gangrene    Elevated prostate specific antigen (PSA)    Enlarged prostate    Esophageal reflux    Extrinsic asthma, unspecified    Herpes zoster without mention of complication    Hyperlipidemia    Hypersomnia with sleep apnea, unspecified    Impotence of organic origin    Kyphosis    Lumbago    Obstructive sleep apnea (adult) (pediatric)    Pneumonia    hx of years ago    Senile osteoporosis    Trigger finger (acquired)    Tubular adenoma of colon 05/2013   Unspecified essential hypertension    Unspecified sleep apnea    CPAP- settings 4-12    Unspecified vitamin D deficiency    Past Surgical History:  Procedure Laterality Date   CARDIAC CATHETERIZATION  03/04/2009   Dr Dorris Fetch   COLONOSCOPY     CORONARY ARTERY BYPASS GRAFT  2010   CYSTOSCOPY WITH RETROGRADE PYELOGRAM, URETEROSCOPY AND STENT  PLACEMENT Right 10/04/2013   Procedure: CYSTOSCOPY WITH RETROGRADE PYELOGRAM,  AND STENT PLACEMENT;  Surgeon: Jerilee Field, MD;  Location: [facility];  Service: Urology;  Laterality: Right;   DENTAL SURGERY N/A    MOLE REMOVAL  1958   chin   ROTATOR CUFF REPAIR Left 04/1998   with bone spur removed, Dr Madelon Lips   TONSILLECTOMY  1945   TRANSURETHRAL RESECTION OF PROSTATE N/A 09/24/2013   Procedure: TRANSURETHRAL RESECTION OF THE PROSTATE (TURP) WITH GYRUS (STAGED RIGHT LATERAL AND MEDIAN LOBE);  Surgeon: Kathi Ludwig, MD;   Location: [facility];  Service: Urology;  Laterality: N/A;   UPPER GI ENDOSCOPY     Social History:   reports that he has never smoked. He has never been exposed to tobacco smoke. He has never used smokeless tobacco. He reports that he does not drink alcohol and does not use drugs.  Family History  Problem Relation Age of Onset   Stroke Mother    Breast cancer Mother    Cirrhosis Mother        wine   Rheumatic fever Mother    Kyphosis Mother    Hypertension Father    Stroke Father    Heart disease Father    Heart disease Paternal Grandfather    Colon cancer Neg Hx     Medications: Patient's Medications  New Prescriptions   No medications on file  Previous Medications   ALBUTEROL (VENTOLIN HFA) 108 (90 BASE) MCG/ACT INHALER    Inhale 1-2 puffs into the lungs every 6 (six) hours as needed for wheezing or shortness of breath.   AMIODARONE (PACERONE) 200 MG TABLET    TAKE 1 TABLET BY MOUTH DAILY   CALCIUM CITRATE-VITAMIN D (EQ CALCIUM CITRATE+D3 PO)    Take 1,200 mg by mouth daily.   CARVEDILOL (COREG) 3.125 MG TABLET    Take 1 tablet (3.125 mg total) by mouth 2 (two) times daily. Please keep scheduled appointment with cardiologist   CHOLECALCIFEROL (VITAMIN D-3) 5000 UNITS TABS    Take 10,000 Units by mouth daily.   COENZYME Q10 (COQ10) 200 MG CAPS    Take 200 mg by mouth daily.   DOXAZOSIN (CARDURA) 2 MG TABLET    TAKE 1 TABLET BY MOUTH DAILY   EMPAGLIFLOZIN (JARDIANCE) 10 MG TABS TABLET    Take 1 tablet (10 mg total) by mouth daily before breakfast.   FEXOFENADINE (ALLEGRA) 180 MG TABLET    Take 180 mg by mouth daily as needed for allergies.   FINASTERIDE (PROSCAR) 5 MG TABLET    TAKE 1 TABLET BY MOUTH IN  THE MORNING   FOLIC ACID (FOLVITE) 1 MG TABLET    TAKE 1 TABLET BY MOUTH DAILY   FUROSEMIDE (LASIX) 20 MG TABLET    TAKE 1 TABLET BY MOUTH DAILY AS  NEEDED FOR LEG SWELLING   IRON, FERROUS SULFATE, 325 (65 FE) MG TABS    Take 325 mg by mouth 2 (two) times daily.   LOSARTAN  (COZAAR) 25 MG TABLET    Take 1 tablet (25 mg total) by mouth daily. Please keep scheduled appointment with cardiologist   MEXILETINE (MEXITIL) 150 MG CAPSULE    Take 1 capsule (150 mg total) by mouth 2 (two) times daily.   PANTOPRAZOLE (PROTONIX) 40 MG TABLET    TAKE 1 TABLET BY MOUTH DAILY   ROSUVASTATIN (CRESTOR) 20 MG TABLET    TAKE 1 TABLET BY MOUTH ONCE  DAILY FOR CHOLESTEROL   WARFARIN (COUMADIN) 2.5 MG TABLET  TAKE 1/2 TO 1 TABLET BY MOUTH  DAILY AS DIRECTED BY THE  COUMADIN CLINIC  Modified Medications   No medications on file  Discontinued Medications   No medications on file    Physical Exam:  Vitals:   09/14/22 1427  BP: (!) 114/58  Pulse: 64  Temp: (!) 97.3 F (36.3 C)  TempSrc: Temporal  SpO2: 96%  Weight: 126 lb 9.6 oz (57.4 kg)  Height: 5\' 2"  (1.575 m)   Body mass index is 23.16 kg/m. Wt Readings from Last 3 Encounters:  09/14/22 126 lb 9.6 oz (57.4 kg)  09/12/22 122 lb (55.3 kg)  08/22/22 127 lb 12.8 oz (58 kg)    Physical Exam Constitutional:      General: He is not in acute distress.    Appearance: He is well-developed. He is not diaphoretic.  HENT:     Head: Normocephalic and atraumatic.     Right Ear: External ear normal.     Left Ear: External ear normal.     Mouth/Throat:     Pharynx: No oropharyngeal exudate.  Eyes:     Conjunctiva/sclera: Conjunctivae normal.     Pupils: Pupils are equal, round, and reactive to light.  Cardiovascular:     Rate and Rhythm: Normal rate and regular rhythm.     Heart sounds: Normal heart sounds.  Pulmonary:     Effort: Pulmonary effort is normal.     Breath sounds: Normal breath sounds.  Abdominal:     General: Bowel sounds are normal.     Palpations: Abdomen is soft.  Musculoskeletal:        General: No tenderness.     Cervical back: Normal range of motion and neck supple.     Right lower leg: No edema.     Left lower leg: No edema.  Skin:    General: Skin is warm and dry.  Neurological:     Mental  Status: He is alert and oriented to person, place, and time.   ***  Labs reviewed: Basic Metabolic Panel: Recent Labs    03/16/22 1455 08/31/22 1515  NA 138 138  K 3.9 3.9  CL 104 103  CO2 28 23  GLUCOSE 118* 128*  BUN 23 25  CREATININE 1.33* 1.16  CALCIUM 8.5* 8.8  TSH  --  1.200   Liver Function Tests: Recent Labs    03/16/22 1455 08/31/22 1515  AST 29 27  ALT 30 23  ALKPHOS  --  59  BILITOT 0.9 1.0  PROT 6.1 5.8*  ALBUMIN  --  4.0   No results for input(s): "LIPASE", "AMYLASE" in the last 8760 hours. No results for input(s): "AMMONIA" in the last 8760 hours. CBC: Recent Labs    12/08/21 1139 03/16/22 1455  WBC 5.3 4.9  NEUTROABS 3,636 3,474  HGB 12.0* 12.9*  HCT 34.4* 35.5*  MCV 93.2 95.2  PLT 139* 178   Lipid Panel: Recent Labs    03/16/22 1455  CHOL 122  HDL 62  LDLCALC 46  TRIG 65  CHOLHDL 2.0   TSH: Recent Labs    08/31/22 1515  TSH 1.200   A1C: Lab Results  Component Value Date   HGBA1C 5.6 07/28/2012     Assessment/Plan 1. Benign prostatic hyperplasia with weak urinary stream ***  2. Coronary artery disease involving native coronary artery of native heart without angina pectoris ***  3. Essential hypertension ***  4. Iron deficiency anemia secondary to inadequate dietary iron intake ***  5. Gastroesophageal  reflux disease, unspecified whether esophagitis present ***  6. Stage 3a chronic kidney disease (HCC) ***  7. Atrial fibrillation, unspecified type (HCC) *** - CBC with Differential/Platelet  8. Other iron deficiency anemia *** - CBC with Differential/Platelet  9. Weight loss ***  10. Fall, subsequent encounter *** - Ambulatory referral to Physical Therapy  11. Kyphosis of cervicothoracic region, unspecified kyphosis type *** - Ambulatory referral to Physical Therapy   No follow-ups on file.: ***  Nikkita Adeyemi K. Biagio Borg [facility] [phone]

## 2022-09-18 ENCOUNTER — Ambulatory Visit: Payer: Self-pay

## 2022-09-18 NOTE — Patient Instructions (Signed)
Visit Information  Thank you for taking time to visit with me today. Please don't hesitate to contact me if I can be of assistance to you.   Following are the goals we discussed today:   Goals Addressed               This Visit's Progress     Patient Stated     I would like to participate in the Y exercise program (pt-stated)        Care Coordination Interventions: Evaluation of current treatment plan related to impaired physical mobility and patient's adherence to plan as established by provider Determined patient missed the nurse call from [facility] regarding the PREP program Determined patient plans to complete outpatient PT before starting the PREP program Provided patient with the contact information for nurse Pam and encouraged him to call Pam to discuss the next scheduled PREP class and his start date      Other     To have no more falls        Care Coordination Interventions: Provided written and verbal education re: potential causes of falls and Fall prevention strategies Advised patient of importance of notifying provider of falls Assessed for falls since last encounter Assessed patients knowledge of fall risk prevention secondary to previously provided education Reviewed with patient PCP recommendations for outpatient PT, determined patient plans to resume services with his previous therapist, Misty Stanley with [facility], he will contact Misty Stanley to set up therapy Assessed for DME needs, patient declines needing a cane or walker for ambulation at this time          To maintain muscle mass by increasing protein        Care Coordination Interventions: Evaluation of current treatment plan related to Nutritional Imbalance and patient's adherence to plan as established by provider Discussed with patient he has lost weight due to taking in less protein with meals on a regular basis Determined patient is interested in Meals on Wheels but unfortunately he is not eligible  due to he continues to drive himself to his appointments Assessed for food insecurity, patient reports using local food banks and has several friends who provide him and his spouse with healthy foods Determined patient prepares the meals in the home and denies having difficulty in doing so Discussed with patient the benefits of supplementing and or adding high protein drinks to his meal regimen such as Ensure and or Boost Mailed printed coupons to patient to help with cost of Ensure Instructed patient to keep his doctor well informed of persistent weight loss Instructed patient to let this RN know if he stops driving and would like to sign up for Meals on Wheels and patient is agreeable  Body mass index is 23.16 kg/m.    Wt Readings from Last 3 Encounters:  09/14/22 126 lb 9.6 oz (57.4 kg)  09/12/22 122 lb (55.3 kg)  08/22/22 127 lb 12.8 oz (58 kg)              Our next appointment is by telephone on 10/16/22 at 1:30 PM  Please call the care guide team at [phone] if you need to cancel or reschedule your appointment.   If you are experiencing a Mental Health or Behavioral Health Crisis or need someone to talk to, please call 1-800-273-TALK (toll free, 24 hour hotline) go to [facility] [address], [place] [phone])  Patient verbalizes understanding of instructions and care plan provided today and  agrees to view in [place]. Active MyChart status and patient understanding of how to access instructions and care plan via MyChart confirmed with patient.     Delsa Sale, RN, BSN, CCM Care Management Coordinator [facility] Care Management Direct Phone: [phone]

## 2022-09-18 NOTE — Patient Outreach (Signed)
Care Coordination   Follow Up Visit Note   09/18/2022 Name: Brent Griffith MRN: [id number] DOB: 1936/02/27  [name] is a 87 y.o. year old male who sees Eubanks, Janene Harvey, NP for primary care. I spoke with  Brent Griffith by phone today.  What matters to the patients health and wellness today?  Patient would like to start outpatient PT. He would like to maintain and or gain muscle mass by increasing his intake of protein.     Goals Addressed               This Visit's Progress     Patient Stated     I would like to participate in the Y exercise program (pt-stated)        Care Coordination Interventions: Evaluation of current treatment plan related to impaired physical mobility and patient's adherence to plan as established by provider Determined patient missed the nurse call from [facility] regarding the PREP program Determined patient plans to complete outpatient PT before starting the PREP program Provided patient with the contact information for nurse Pam and encouraged him to call Pam to discuss the next scheduled PREP class and his start date      Other     To have no more falls        Care Coordination Interventions: Provided written and verbal education re: potential causes of falls and Fall prevention strategies Advised patient of importance of notifying provider of falls Assessed for falls since last encounter Assessed patients knowledge of fall risk prevention secondary to previously provided education Reviewed with patient PCP recommendations for outpatient PT, determined patient plans to resume services with his previous therapist, Brent Griffith with [facility], he will contact Brent Griffith to set up therapy Assessed for DME needs, patient declines needing a cane or walker for ambulation at this time          To maintain muscle mass by increasing protein        Care Coordination Interventions: Evaluation of current treatment plan related to Nutritional Imbalance  and patient's adherence to plan as established by provider Discussed with patient he has lost weight due to taking in less protein with meals on a regular basis Determined patient is interested in Meals on Wheels but unfortunately he is not eligible due to he continues to drive himself to his appointments Assessed for food insecurity, patient reports using local food banks and has several friends who provide him and his spouse with healthy foods Determined patient prepares the meals in the home and denies having difficulty in doing so Discussed with patient the benefits of supplementing and or adding high protein drinks to his meal regimen such as Ensure and or Boost Mailed printed coupons to patient to help with cost of Ensure Instructed patient to keep his doctor well informed of persistent weight loss Instructed patient to let this RN know if he stops driving and would like to sign up for Meals on Wheels and patient is agreeable  Body mass index is 23.16 kg/m.    Wt Readings from Last 3 Encounters:  09/14/22 126 lb 9.6 oz (57.4 kg)  09/12/22 122 lb (55.3 kg)  08/22/22 127 lb 12.8 oz (58 kg)      Interventions Today    Flowsheet Row Most Recent Value  Chronic Disease   Chronic disease during today's visit Other  [s/p fall]  General Interventions   General Interventions Discussed/Reviewed General Interventions Discussed, General Interventions Reviewed, Doctor Visits  Doctor Visits Discussed/Reviewed Doctor Visits Discussed, Doctor Visits Reviewed, PCP, Specialist  Exercise Interventions   Exercise Discussed/Reviewed Exercise Discussed, Exercise Reviewed, Physical Activity  Physical Activity Discussed/Reviewed Physical Activity Reviewed, Physical Activity Discussed, PREP, Types of exercise  Education Interventions   Education Provided Provided Education, Provided Printed Education  Provided Verbal Education On When to see the doctor, Exercise, Nutrition  Nutrition Interventions    Nutrition Discussed/Reviewed Nutrition Discussed, Nutrition Reviewed, Increaing proteins  Safety Interventions   Safety Discussed/Reviewed Safety Reviewed, Safety Discussed, Fall Risk          SDOH assessments and interventions completed:  No     Care Coordination Interventions:  Yes, provided   Follow up plan: Follow up call scheduled for 10/16/22 @1 :30 PM    Encounter Outcome:  Pt. Visit Completed

## 2022-10-03 ENCOUNTER — Emergency Department (HOSPITAL_BASED_OUTPATIENT_CLINIC_OR_DEPARTMENT_OTHER): Payer: Medicare Other

## 2022-10-03 ENCOUNTER — Encounter (HOSPITAL_BASED_OUTPATIENT_CLINIC_OR_DEPARTMENT_OTHER): Payer: Self-pay

## 2022-10-03 ENCOUNTER — Other Ambulatory Visit: Payer: Self-pay

## 2022-10-03 ENCOUNTER — Emergency Department (HOSPITAL_BASED_OUTPATIENT_CLINIC_OR_DEPARTMENT_OTHER)
Admission: EM | Admit: 2022-10-03 | Discharge: 2022-10-03 | Disposition: A | Discharge status: Home / Self Care | Payer: Medicare Other | Attending: Emergency Medicine | Admitting: Emergency Medicine

## 2022-10-03 DIAGNOSIS — M62830 Muscle spasm of back: Secondary | ICD-10-CM

## 2022-10-03 DIAGNOSIS — G8929 Other chronic pain: Secondary | ICD-10-CM | POA: Diagnosis not present

## 2022-10-03 DIAGNOSIS — M542 Cervicalgia: Secondary | ICD-10-CM | POA: Diagnosis not present

## 2022-10-03 DIAGNOSIS — W0110XA Fall on same level from slipping, tripping and stumbling with subsequent striking against unspecified object, initial encounter: Secondary | ICD-10-CM | POA: Insufficient documentation

## 2022-10-03 DIAGNOSIS — M546 Pain in thoracic spine: Secondary | ICD-10-CM | POA: Diagnosis not present

## 2022-10-03 DIAGNOSIS — R519 Headache, unspecified: Secondary | ICD-10-CM | POA: Diagnosis not present

## 2022-10-03 DIAGNOSIS — S0990XA Unspecified injury of head, initial encounter: Secondary | ICD-10-CM | POA: Diagnosis not present

## 2022-10-03 DIAGNOSIS — Z7901 Long term (current) use of anticoagulants: Secondary | ICD-10-CM | POA: Diagnosis not present

## 2022-10-03 DIAGNOSIS — M549 Dorsalgia, unspecified: Secondary | ICD-10-CM | POA: Diagnosis not present

## 2022-10-03 DIAGNOSIS — W19XXXA Unspecified fall, initial encounter: Secondary | ICD-10-CM

## 2022-10-03 MED ORDER — CYCLOBENZAPRINE HCL 5 MG PO TABS: 10.0000 mg | ORAL_TABLET | Freq: Two times a day (BID) | ORAL | 0 refills | Status: DC | PRN

## 2022-10-03 NOTE — Discharge Instructions (Signed)
Your history, exam, workup today led [place] to get CT imaging of your head, neck, and upper back after the fall on blood thinners.  Your CT scans did not show evidence of acute fracture or dislocation in your head neck or upper back but did show the stable appearing chronic compression fracture.  Due to your pain and tenderness I suspect you are having muscle spasms and some bony pain from the fall but there was no evidence of new acute bony injury.  As you have already already been using numbing patches and pain medicine, we will give you a prescription for low-dose of a muscle relaxant to help with the spasm but please be very careful not to fall.  Please follow-up with your primary doctor and back doctor.  If any symptoms change or worsen acutely, please return to the nearest emergency department.

## 2022-10-03 NOTE — ED Provider Notes (Signed)
[facility] DE[facility]MENT AT [facility] Provider Note   CSN: [id number] Arrival date & time: 10/03/22  1646     History  Chief Complaint  Patient presents with   Brent Griffith is a 87 y.o. male.  The history is provided by the patient and medical records. No language interpreter was used.  Fall This is a new problem. The current episode started yesterday. The problem occurs constantly. The problem has not changed since onset.Associated symptoms include headaches (resolved now). Pertinent negatives include no chest pain, no abdominal pain and no shortness of breath. Nothing aggravates the symptoms. Nothing relieves the symptoms. He has tried nothing for the symptoms. The treatment provided no relief.       Home Medications Prior to Admission medications   Medication Sig Start Date End Date Taking? Authorizing Provider  albuterol (VENTOLIN HFA) 108 (90 Base) MCG/ACT inhaler Inhale 1-2 puffs into the lungs every 6 (six) hours as needed for wheezing or shortness of breath. 07/03/22   Octavia Heir, NP  amiodarone (PACERONE) 200 MG tablet TAKE 1 TABLET BY MOUTH DAILY 08/07/22   Lewayne Bunting, MD  Calcium Citrate-Vitamin D (EQ CALCIUM CITRATE+D3 PO) Take 1,200 mg by mouth daily.    [provider]  carvedilol (COREG) 3.125 MG tablet Take 1 tablet (3.125 mg total) by mouth 2 (two) times daily. Please keep scheduled appointment with cardiologist 05/25/22   Lewayne Bunting, MD  Cholecalciferol (VITAMIN D-3) 5000 units TABS Take 10,000 Units by mouth daily.    [provider]  Coenzyme Q10 (COQ10) 200 MG CAPS Take 200 mg by mouth daily.    [provider]  doxazosin (CARDURA) 2 MG tablet TAKE 1 TABLET BY MOUTH DAILY 03/27/22   Lewayne Bunting, MD  empagliflozin (JARDIANCE) 10 MG TABS tablet Take 1 tablet (10 mg total) by mouth daily before breakfast. 04/16/22   Lewayne Bunting, MD  fexofenadine (ALLEGRA) 180 MG tablet Take 180 mg by  mouth daily as needed for allergies.    [provider]  finasteride (PROSCAR) 5 MG tablet TAKE 1 TABLET BY MOUTH IN  THE MORNING 10/06/21   Sharon Seller, NP  furosemide (LASIX) 20 MG tablet TAKE 1 TABLET BY MOUTH DAILY AS  NEEDED FOR LEG SWELLING 05/01/22   Lewayne Bunting, MD  Iron, Ferrous Sulfate, 325 (65 Fe) MG TABS Take 325 mg by mouth 2 (two) times daily. 09/13/21   Sharon Seller, NP  losartan (COZAAR) 25 MG tablet Take 1 tablet (25 mg total) by mouth daily. Please keep scheduled appointment with cardiologist 05/22/22   Lewayne Bunting, MD  mexiletine (MEXITIL) 150 MG capsule Take 1 capsule (150 mg total) by mouth 2 (two) times daily. 02/19/22   Lanier Prude, MD  pantoprazole (PROTONIX) 40 MG tablet TAKE 1 TABLET BY MOUTH DAILY 04/23/22   Sharon Seller, NP  rosuvastatin (CRESTOR) 20 MG tablet TAKE 1 TABLET BY MOUTH ONCE  DAILY FOR CHOLESTEROL 07/24/22   Sharon Seller, NP  warfarin (COUMADIN) 2.5 MG tablet TAKE 1/2 TO 1 TABLET BY MOUTH  DAILY AS DIRECTED BY THE  COUMADIN CLINIC 07/04/22   Lewayne Bunting, MD      Allergies    Compazine [prochlorperazine edisylate]    Review of Systems   Review of Systems  Constitutional:  Negative for chills, fatigue and fever.  HENT:  Negative for congestion.   Respiratory:  Negative for cough, chest tightness  and shortness of breath.   Cardiovascular:  Negative for chest pain.  Gastrointestinal:  Negative for abdominal pain, constipation, diarrhea, nausea and vomiting.  Genitourinary:  Negative for dysuria and flank pain.  Musculoskeletal:  Positive for back pain and neck pain. Negative for neck stiffness.  Skin:  Negative for rash and wound.  Neurological:  Positive for headaches (resolved now). Negative for weakness and light-headedness.  Psychiatric/Behavioral:  Negative for agitation.   All other systems reviewed and are negative.   Physical Exam Updated Vital Signs BP (!) 152/70 (BP Location: Right Arm)    Pulse (!) 56   Temp 97.8 F (36.6 C)   Resp 20   Ht 5\' 2"  (1.575 m)   Wt 53.5 kg   SpO2 99%   BMI 21.58 kg/m  Physical Exam Vitals and nursing note reviewed.  Constitutional:      General: He is not in acute distress.    Appearance: He is well-developed. He is not ill-appearing, toxic-appearing or diaphoretic.  HENT:     Head: Normocephalic and atraumatic.     Nose: No congestion or rhinorrhea.     Mouth/Throat:     Mouth: Mucous membranes are moist.     Pharynx: No oropharyngeal exudate or posterior oropharyngeal erythema.  Eyes:     Extraocular Movements: Extraocular movements intact.     Conjunctiva/sclera: Conjunctivae normal.     Pupils: Pupils are equal, round, and reactive to light.  Cardiovascular:     Rate and Rhythm: Normal rate and regular rhythm.     Heart sounds: No murmur heard. Pulmonary:     Effort: Pulmonary effort is normal. No respiratory distress.     Breath sounds: Normal breath sounds. No wheezing, rhonchi or rales.  Chest:     Chest wall: No tenderness.  Abdominal:     General: Abdomen is flat.     Palpations: Abdomen is soft.     Tenderness: There is no abdominal tenderness. There is no right CVA tenderness, left CVA tenderness, guarding or rebound.  Musculoskeletal:        General: Tenderness present. No swelling.     Cervical back: Neck supple. Tenderness present.     Right lower leg: No edema.     Left lower leg: No edema.  Skin:    General: Skin is warm and dry.     Capillary Refill: Capillary refill takes less than 2 seconds.     Findings: No erythema or rash.  Neurological:     General: No focal deficit present.     Mental Status: He is alert.     Sensory: No sensory deficit.     Motor: No weakness.  Psychiatric:        Mood and Affect: Mood normal.     ED Results / Procedures / Treatments   Labs (all labs ordered are listed, but only abnormal results are displayed) Labs Reviewed - No data to  display  EKG None  Radiology CT Thoracic Spine Wo Contrast  Result Date: 10/03/2022 CLINICAL DATA:  Mid-back pain, prior compression fracture Larey Seat yesterday with the point of his head hitting a wooden bed frame. Now has pain in his neck and thoracic spine where he has had compression fractures in the past. EXAM: CT THORACIC SPINE WITHOUT CONTRAST TECHNIQUE: Multidetector CT images of the thoracic were obtained using the standard protocol without intravenous contrast. RADIATION DOSE REDUCTION: This exam was performed according to the departmental dose-optimization program which includes automated exposure control, adjustment of the mA  and/or kV according to patient size and/or use of iterative reconstruction technique. COMPARISON:  X-ray neck 06/27/2014, chest x-ray 08/09/2021 FINDINGS: Alignment: Exaggerated kyphotic curvature centered at the T3 level. Vertebrae: Diffusely decreased bone density. Chronic, grossly stable, T3 vertebral body fracture with a to 80% vertebral body height the height loss centrally. No acute fracture or focal pathologic process. Paraspinal and other soft tissues: Negative. Disc levels: Multilevel mild intervertebral disc space narrowing and trace vacuum phenomenon. Other: Bibasilar atelectasis of the lungs. Four-vessel coronary calcifications status post coronary artery bypass graft. Cholelithiasis. Atherosclerotic plaque of the aorta. IMPRESSION: 1. Chronic, grossly stable, T3 vertebral body fracture with a to 80% vertebral body height the height loss centrally. Correlate with point tenderness to palpation to evaluate for an acute component. 2. Otherwise no acute displaced fracture or traumatic listhesis of the thoracic spine. 3.  Aortic Atherosclerosis (ICD10-I70.0). 4. Cholelithiasis. Electronically Signed   By: Tish Frederickson M.D.   On: 10/03/2022 18:41   CT Cervical Spine Wo Contrast  Result Date: 10/03/2022 CLINICAL DATA:  Head trauma, minor (Age >= 65y) Fell yesterday  with the point of his head hitting a wooden bed frame. Now has pain in his neck and thoracic spine where he has had compression fractures in the past. EXAM: CT HEAD WITHOUT CONTRAST CT CERVICAL SPINE WITHOUT CONTRAST TECHNIQUE: Multidetector CT imaging of the head and cervical spine was performed following the standard protocol without intravenous contrast. Multiplanar CT image reconstructions of the cervical spine were also generated. RADIATION DOSE REDUCTION: This exam was performed according to the departmental dose-optimization program which includes automated exposure control, adjustment of the mA and/or kV according to patient size and/or use of iterative reconstruction technique. COMPARISON:  CT head 09/12/2022, CT head 02/15/2022, CT head 01/06/1998 report without imaging FINDINGS: CT HEAD FINDINGS Brain: Cerebral ventricle sizes are concordant with the degree of cerebral volume loss. No evidence of large-territorial acute infarction. No parenchymal hemorrhage. No mass lesion. No extra-axial collection. No mass effect or midline shift. No hydrocephalus. Basilar cisterns are patent. Vascular: No hyperdense vessel. Atherosclerotic calcifications are present within the cavernous internal carotid and vertebral arteries. Skull: No acute fracture or focal lesion. Chronic thinning of the left frontoparietal junction of the calvarium. Sinuses/Orbits: Paranasal sinuses and mastoid air cells are clear. The orbits are unremarkable. Other: None. CT CERVICAL SPINE FINDINGS Alignment: Grade 1 anterolisthesis of C7 on T1. Skull base and vertebrae: Multilevel mild-to-moderate degenerative changes of the spine. No associated severe osseous neural foraminal or central canal stenosis. Multilevel intervertebral disc space vacuum phenomenon. No acute fracture. No aggressive appearing focal osseous lesion or focal pathologic process. Soft tissues and spinal canal: No prevertebral fluid or swelling. No visible canal hematoma.  Upper chest: Stable biapical pleural/pulmonary scarring with calcification. Other: None. IMPRESSION: 1. No acute intracranial abnormality. 2. No acute displaced fracture or traumatic listhesis of the cervical spine. 3. Please see separately dictated CT thoracic spine 10/03/2022. Electronically Signed   By: Tish Frederickson M.D.   On: 10/03/2022 18:35   CT Head Wo Contrast  Result Date: 10/03/2022 CLINICAL DATA:  Head trauma, minor (Age >= 65y) Fell yesterday with the point of his head hitting a wooden bed frame. Now has pain in his neck and thoracic spine where he has had compression fractures in the past. EXAM: CT HEAD WITHOUT CONTRAST CT CERVICAL SPINE WITHOUT CONTRAST TECHNIQUE: Multidetector CT imaging of the head and cervical spine was performed following the standard protocol without intravenous contrast. Multiplanar CT image reconstructions of the  cervical spine were also generated. RADIATION DOSE REDUCTION: This exam was performed according to the departmental dose-optimization program which includes automated exposure control, adjustment of the mA and/or kV according to patient size and/or use of iterative reconstruction technique. COMPARISON:  CT head 09/12/2022, CT head 02/15/2022, CT head 01/06/1998 report without imaging FINDINGS: CT HEAD FINDINGS Brain: Cerebral ventricle sizes are concordant with the degree of cerebral volume loss. No evidence of large-territorial acute infarction. No parenchymal hemorrhage. No mass lesion. No extra-axial collection. No mass effect or midline shift. No hydrocephalus. Basilar cisterns are patent. Vascular: No hyperdense vessel. Atherosclerotic calcifications are present within the cavernous internal carotid and vertebral arteries. Skull: No acute fracture or focal lesion. Chronic thinning of the left frontoparietal junction of the calvarium. Sinuses/Orbits: Paranasal sinuses and mastoid air cells are clear. The orbits are unremarkable. Other: None. CT CERVICAL SPINE  FINDINGS Alignment: Grade 1 anterolisthesis of C7 on T1. Skull base and vertebrae: Multilevel mild-to-moderate degenerative changes of the spine. No associated severe osseous neural foraminal or central canal stenosis. Multilevel intervertebral disc space vacuum phenomenon. No acute fracture. No aggressive appearing focal osseous lesion or focal pathologic process. Soft tissues and spinal canal: No prevertebral fluid or swelling. No visible canal hematoma. Upper chest: Stable biapical pleural/pulmonary scarring with calcification. Other: None. IMPRESSION: 1. No acute intracranial abnormality. 2. No acute displaced fracture or traumatic listhesis of the cervical spine. 3. Please see separately dictated CT thoracic spine 10/03/2022. Electronically Signed   By: Tish Frederickson M.D.   On: 10/03/2022 18:35    Procedures Procedures    Medications Ordered in ED Medications - No data to display  ED Course/ Medical Decision Making/ A&P                             Medical Decision Making Amount and/or Complexity of Data Reviewed Radiology: ordered.  Risk Prescription drug management.    Brent Griffith is a 87 y.o. male with a past medical history significant for CAD with previous CABG, A-fib on Coumadin therapy, CHF, hyperlipidemia, prostate margin, sleep apnea, arthritis, and previous thoracic compression fracture she reports who presents with head injury and fall.  According to patient, yesterday he had a mechanical fall tripping on some carpet when he was walking back into his bedroom from the bathroom and tripped hitting the crown of his head on the wooden side of his bed frame.  He reports did not lose consciousness but had some mild pain in his head but developed worsened pain in his neck and mid back.  He reports he is concerned he has a compression fracture again.  He otherwise did not have any preceding symptoms and specifically denies fevers, chills, chest pain, shortness breath, nausea,  vomiting, constipation, diarrhea, or urinary changes.  Denies any loss of bowel or bladder control.  Denies any arm or leg numbness or weakness.  Denies any vision changes or speech abnormalities.  Reports he did not lose consciousness during the injury.  He denies any preceding symptoms.  On exam, lungs clear and chest nontender.  Abdomen nontender.  Low back nontender.  Mid back is tender primarily paraspinally in his kyphotic thoracic spine and C-spine.  His head was not focally tender and there was no crepitance.  No laceration seen.  Pupils symmetric and reactive with normal extraocular movements.  Symmetric smile.  Clear speech.  No focal neurologic deficits initially.  We had a shared decision-making conversation and given  his lack of any preceding symptoms, we agreed to hold on extensive lab workup but we did agree to get CT of the head, neck, and thoracic spine where he is having his pain and history of previous compression fractures.  If workup reassuring, anticipate discharge home for outpatient follow-up with likely musculoskeletal pains.  CT scan returned without evidence of intracranial injury or skull fracture.  C-spine did not show acute fracture and thoracic spine showed stable injury to his thoracic vertebra.  No evidence of acute fracture or dislocation seen.  Suspect some degree of muscle spasm and exacerbated chronic pain in his back.  Patient is already taking Lidoderm patches and over-the-counter medications.  He asked for something stronger to help with muscle spasm.  We agreed to give him a low-dose of the muscle relaxant and he will be very careful taking it.  He agrees with return precautions and plan of care and seeing his doctor this week.  Patient discharged in good condition.         Final Clinical Impression(s) / ED Diagnoses Final diagnoses:  Fall, initial encounter  Muscle spasm of back  Chronic thoracic back pain, unspecified back pain laterality    Rx / DC  Orders ED Discharge Orders          Ordered    cyclobenzaprine (FLEXERIL) 5 MG tablet  2 times daily PRN        10/03/22 2017            Clinical Impression: 1. Fall, initial encounter   2. Muscle spasm of back   3. Chronic thoracic back pain, unspecified back pain laterality     Disposition: Discharge  Condition: Good  I have discussed the results, Dx and Tx plan with the pt(& family if present). He/she/they expressed understanding and agree(s) with the plan. Discharge instructions discussed at great length. Strict return precautions discussed and pt &/or family have verbalized understanding of the instructions. No further questions at time of discharge.    New Prescriptions   CYCLOBENZAPRINE (FLEXERIL) 5 MG TABLET    Take 2 tablets (10 mg total) by mouth 2 (two) times daily as needed.    Follow Up: Sharon Seller, NP [address] [place] [address] [phone]     [facility] Emergency Department at [facility] [address] [id number] YQ [facility] [place] [address] [phone]       Tyreck Bell, Canary Brim, MD 10/03/22 2023

## 2022-10-03 NOTE — ED Triage Notes (Signed)
Pt reports tripping and falling this morning. Pt reports hitting top of head. Pt takes warfarin. No LOC.

## 2022-10-05 ENCOUNTER — Ambulatory Visit: Payer: Medicare Other

## 2022-10-09 ENCOUNTER — Emergency Department (HOSPITAL_BASED_OUTPATIENT_CLINIC_OR_DEPARTMENT_OTHER)
Admission: EM | Admit: 2022-10-09 | Discharge: 2022-10-09 | Disposition: A | Discharge status: Home / Self Care | Payer: Medicare Other | Attending: Emergency Medicine | Admitting: Emergency Medicine

## 2022-10-09 ENCOUNTER — Other Ambulatory Visit: Payer: Self-pay

## 2022-10-09 ENCOUNTER — Encounter (HOSPITAL_BASED_OUTPATIENT_CLINIC_OR_DEPARTMENT_OTHER): Payer: Self-pay | Admitting: Emergency Medicine

## 2022-10-09 DIAGNOSIS — G8929 Other chronic pain: Secondary | ICD-10-CM | POA: Insufficient documentation

## 2022-10-09 DIAGNOSIS — Z7901 Long term (current) use of anticoagulants: Secondary | ICD-10-CM | POA: Diagnosis not present

## 2022-10-09 DIAGNOSIS — M546 Pain in thoracic spine: Secondary | ICD-10-CM | POA: Insufficient documentation

## 2022-10-09 MED ORDER — HYDROCODONE-ACETAMINOPHEN 5-325 MG PO TABS: 1.0000 | ORAL_TABLET | Freq: Four times a day (QID) | ORAL | 0 refills | Status: DC | PRN

## 2022-10-09 MED ORDER — METAXALONE 800 MG PO TABS: 800.0000 mg | ORAL_TABLET | Freq: Three times a day (TID) | ORAL | 0 refills | Status: DC

## 2022-10-09 MED ORDER — HYDROCODONE-ACETAMINOPHEN 5-325 MG PO TABS
1.0000 | ORAL_TABLET | Freq: Once | ORAL | Status: AC
  Administered 2022-10-09: 1 via ORAL
  Filled 2022-10-09: qty 1

## 2022-10-09 NOTE — ED Notes (Signed)
D/c paperwork reviewed with pt, including prescriptions.  No questions or concerns voiced at time of d/c. . Pt verbalized understanding, Ambulatory with family to ED exit, NAD.   

## 2022-10-09 NOTE — Discharge Instructions (Signed)
Take the hydrocodone as needed for the thoracic back pain.  Keep your appointment with orthopedics.  You can try the new muscle relaxer.  It is not recommended for elderly but will not interfere with any of your main medicines.  Cannot take both muscle relaxers together.  Follow-up with your doctor if needed in the meantime before being seen by orthopedics.  Return for any new or worse symptoms.

## 2022-10-09 NOTE — ED Triage Notes (Signed)
Pt states he was seen last Wed for back pain and given Flexeril, states it has not been helping, has appt w orthopedic MD 6/12

## 2022-10-09 NOTE — ED Provider Notes (Signed)
[place] [facility] AT [facility] Provider Note   CSN: [id number] Arrival date & time: 10/09/22  2109     History  Chief Complaint  Patient presents with   Back Pain    Brent Griffith is a 87 y.o. male.  Patient seen May 29 for thoracic back pain.  Had CT done at that time.  Patient with a known history of chronic thoracic spine problems known to have a compression fraction in that area.  No new fall or injury.  Not significantly worse was treated with Flexeril just not helping.  So no real new symptoms.  Patient followed by cardiology has significant cardiac history.  Patient is on Pacerone patient is on Coumadin.       Home Medications Prior to Admission medications   Medication Sig Start Date End Date Taking? Authorizing Provider  HYDROcodone-acetaminophen (NORCO/VICODIN) 5-325 MG tablet Take 1 tablet by mouth every 6 (six) hours as needed for moderate pain. 10/09/22  Yes Vanetta Mulders, MD  metaxalone (SKELAXIN) 800 MG tablet Take 1 tablet (800 mg total) by mouth 3 (three) times daily. 10/09/22  Yes Vanetta Mulders, MD  albuterol (VENTOLIN HFA) 108 (90 Base) MCG/ACT inhaler Inhale 1-2 puffs into the lungs every 6 (six) hours as needed for wheezing or shortness of breath. 07/03/22   Octavia Heir, NP  amiodarone (PACERONE) 200 MG tablet TAKE 1 TABLET BY MOUTH DAILY 08/07/22   Lewayne Bunting, MD  Calcium Citrate-Vitamin D (EQ CALCIUM CITRATE+D3 PO) Take 1,200 mg by mouth daily.    [provider]  carvedilol (COREG) 3.125 MG tablet Take 1 tablet (3.125 mg total) by mouth 2 (two) times daily. Please keep scheduled appointment with cardiologist 05/25/22   Lewayne Bunting, MD  Cholecalciferol (VITAMIN D-3) 5000 units TABS Take 10,000 Units by mouth daily.    [provider]  Coenzyme Q10 (COQ10) 200 MG CAPS Take 200 mg by mouth daily.    [provider]  cyclobenzaprine (FLEXERIL) 5 MG tablet Take 2 tablets (10 mg total) by mouth 2  (two) times daily as needed. 10/03/22   Tegeler, Canary Brim, MD  doxazosin (CARDURA) 2 MG tablet TAKE 1 TABLET BY MOUTH DAILY 03/27/22   Lewayne Bunting, MD  empagliflozin (JARDIANCE) 10 MG TABS tablet Take 1 tablet (10 mg total) by mouth daily before breakfast. 04/16/22   Lewayne Bunting, MD  fexofenadine (ALLEGRA) 180 MG tablet Take 180 mg by mouth daily as needed for allergies.    [provider]  finasteride (PROSCAR) 5 MG tablet TAKE 1 TABLET BY MOUTH IN  THE MORNING 10/06/21   Sharon Seller, NP  furosemide (LASIX) 20 MG tablet TAKE 1 TABLET BY MOUTH DAILY AS  NEEDED FOR LEG SWELLING 05/01/22   Lewayne Bunting, MD  Iron, Ferrous Sulfate, 325 (65 Fe) MG TABS Take 325 mg by mouth 2 (two) times daily. 09/13/21   Sharon Seller, NP  losartan (COZAAR) 25 MG tablet Take 1 tablet (25 mg total) by mouth daily. Please keep scheduled appointment with cardiologist 05/22/22   Lewayne Bunting, MD  mexiletine (MEXITIL) 150 MG capsule Take 1 capsule (150 mg total) by mouth 2 (two) times daily. 02/19/22   Lanier Prude, MD  pantoprazole (PROTONIX) 40 MG tablet TAKE 1 TABLET BY MOUTH DAILY 04/23/22   Sharon Seller, NP  rosuvastatin (CRESTOR) 20 MG tablet TAKE 1 TABLET BY MOUTH ONCE  DAILY FOR CHOLESTEROL 07/24/22   Sharon Seller,  NP  warfarin (COUMADIN) 2.5 MG tablet TAKE 1/2 TO 1 TABLET BY MOUTH  DAILY AS DIRECTED BY THE  COUMADIN CLINIC 07/04/22   Lewayne Bunting, MD      Allergies    Compazine [prochlorperazine edisylate]    Review of Systems   Review of Systems  Constitutional:  Negative for chills and fever.  HENT:  Negative for ear pain and sore throat.   Eyes:  Negative for pain and visual disturbance.  Respiratory:  Negative for cough and shortness of breath.   Cardiovascular:  Negative for chest pain and palpitations.  Gastrointestinal:  Negative for abdominal pain and vomiting.  Genitourinary:  Negative for dysuria and hematuria.  Musculoskeletal:   Positive for back pain. Negative for arthralgias.  Skin:  Negative for color change and rash.  Neurological:  Negative for seizures and syncope.  All other systems reviewed and are negative.   Physical Exam Updated Vital Signs BP (!) 142/70   Pulse 80   Temp 98.3 F (36.8 C) (Oral)   Resp 17   Ht 1.575 m (5\' 2" )   Wt 51.3 kg   SpO2 96%   BMI 20.67 kg/m  Physical Exam Vitals and nursing note reviewed.  Constitutional:      General: He is not in acute distress.    Appearance: Normal appearance. He is well-developed. He is not ill-appearing.  HENT:     Head: Normocephalic and atraumatic.  Eyes:     Conjunctiva/sclera: Conjunctivae normal.  Cardiovascular:     Rate and Rhythm: Normal rate and regular rhythm.     Heart sounds: No murmur heard. Pulmonary:     Effort: Pulmonary effort is normal. No respiratory distress.     Breath sounds: Normal breath sounds.  Abdominal:     Palpations: Abdomen is soft.     Tenderness: There is no abdominal tenderness.  Musculoskeletal:        General: Tenderness present. No swelling.     Cervical back: Neck supple. No tenderness.     Comments: Some tenderness to palpation kind of upper thoracic spine area.  Patient with lots of curvature to his thoracic spine.  No lumbar spine tenderness.  No cervical spine tenderness.  Skin:    General: Skin is warm and dry.     Capillary Refill: Capillary refill takes less than 2 seconds.  Neurological:     Mental Status: He is alert and oriented to person, place, and time. Mental status is at baseline.  Psychiatric:        Mood and Affect: Mood normal.     ED Results / Procedures / Treatments   Labs (all labs ordered are listed, but only abnormal results are displayed) Labs Reviewed - No data to display  EKG None  Radiology No results found.  Procedures Procedures    Medications Ordered in ED Medications  HYDROcodone-acetaminophen (NORCO/VICODIN) 5-325 MG per tablet 1 tablet (1 tablet  Oral Given 10/09/22 2229)    ED Course/ Medical Decision Making/ A&P                             Medical Decision Making Risk Prescription drug management.   Patient's thoracic back pain has a history of chronic back pain.  Because exacerbation seen in the emergency department on May 29.  Had CT scan of thoracic spine done showing that he is got a somewhat chronic but unchanged 80% compression collapse.'s been there.  Treated with  Flexeril.  Not helping very much.  Does have follow-up with orthopedics next week.  But is still struggling with pain.  No new fall or injury no new symptoms.  Will try him on Skelaxin because his wife has taken it before and that has helped her.  Will also give him a dose of hydrocodone here and a short course of hydrocodone to help with the pain.  Did warn them that the Skelaxin could be difficult on the older patients.  Repeat imaging does not seem to be indicated.  Patient nontoxic no acute distress.   Final Clinical Impression(s) / ED Diagnoses Final diagnoses:  Chronic midline thoracic back pain    Rx / DC Orders ED Discharge Orders          Ordered    metaxalone (SKELAXIN) 800 MG tablet  3 times daily        10/09/22 2219    HYDROcodone-acetaminophen (NORCO/VICODIN) 5-325 MG tablet  Every 6 hours PRN        10/09/22 2222              Vanetta Mulders, MD 10/09/22 2329

## 2022-10-10 ENCOUNTER — Ambulatory Visit: Payer: Medicare Other | Admitting: Physical Therapy

## 2022-10-12 ENCOUNTER — Ambulatory Visit: Payer: Medicare Other | Attending: Internal Medicine

## 2022-10-12 DIAGNOSIS — I4891 Unspecified atrial fibrillation: Secondary | ICD-10-CM | POA: Diagnosis not present

## 2022-10-12 DIAGNOSIS — Z5181 Encounter for therapeutic drug level monitoring: Secondary | ICD-10-CM | POA: Diagnosis not present

## 2022-10-12 LAB — POCT INR: INR: 5.1 — AB (ref 2.0–3.0)

## 2022-10-12 NOTE — Patient Instructions (Signed)
HOLD SATURDAY, SUNDAY and MONDAY then Continue taking Warfarin 1/2 tablet daily except for 1 tablet on Monday and Friday. Recheck INR in 1 week Pt drink's boost daily. [facility] [phone]

## 2022-10-16 ENCOUNTER — Ambulatory Visit: Payer: Self-pay

## 2022-10-16 NOTE — Patient Outreach (Signed)
  Care Coordination   10/16/2022 Name: Brent Griffith MRN: [id number] DOB: Jun 21, 1935   Care Coordination Outreach Attempts:  An unsuccessful telephone outreach was attempted for a scheduled appointment today.  Follow Up Plan:  Additional outreach attempts will be made to offer the patient care coordination information and services.   Encounter Outcome:  No Answer   Care Coordination Interventions:  No, not indicated    Delsa Sale, RN, BSN, CCM Care Management Coordinator [facility] Care Management  Direct Phone: [phone]

## 2022-10-17 ENCOUNTER — Telehealth: Payer: Medicare Other

## 2022-10-17 ENCOUNTER — Other Ambulatory Visit (HOSPITAL_BASED_OUTPATIENT_CLINIC_OR_DEPARTMENT_OTHER): Payer: Self-pay | Admitting: Sports Medicine

## 2022-10-17 DIAGNOSIS — M810 Age-related osteoporosis without current pathological fracture: Secondary | ICD-10-CM

## 2022-10-17 DIAGNOSIS — M546 Pain in thoracic spine: Secondary | ICD-10-CM | POA: Diagnosis not present

## 2022-10-17 DIAGNOSIS — M40204 Unspecified kyphosis, thoracic region: Secondary | ICD-10-CM | POA: Diagnosis not present

## 2022-10-17 NOTE — Transitions of Care (Post Inpatient/ED Visit) (Signed)
10/17/2022  Name: Brent Griffith MRN: [id number] DOB: 06-Feb-1936  Today's TOC FU Call Status: Today's TOC FU Call Status:: Successful TOC FU Call Competed TOC FU Call Complete Date: 10/17/22  Transition Care Management Follow-up Telephone Call Date of Discharge: 10/09/22 Discharge Facility: [facility] Type of Discharge: Emergency Department Reason for ED Visit: Other: How have you been since you were released from the hospital?: Same Any questions or concerns?: Yes Patient Questions/Concerns:: will discuss with provider at appointment Patient Questions/Concerns Addressed: Other:  Items Reviewed: Did you receive and understand the discharge instructions provided?: Yes Medications obtained,verified, and reconciled?: Partial Review Completed Reason for Partial Mediation Review: patient was at ortho follow up at the time of call Any new allergies since your discharge?: No Dietary orders reviewed?: NA Do you have support at home?: Yes Name of Support/Comfort Primary Source: spouse  Medications Reviewed Today: Medications Reviewed Today     Reviewed by Hendricks Limes, RN (Registered Nurse) on 10/03/22 at 1733  Med List Status: <None>   Medication Order Taking? Sig Documenting Provider Last Dose Status Informant  albuterol (VENTOLIN HFA) 108 (90 Base) MCG/ACT inhaler [id number]  Inhale 1-2 puffs into the lungs every 6 (six) hours as needed for wheezing or shortness of breath. Octavia Heir, NP  Active   amiodarone (PACERONE) 200 MG tablet [id number]  TAKE 1 TABLET BY MOUTH DAILY Jens Som Madolyn Frieze, MD  Active   Calcium Citrate-Vitamin D (EQ CALCIUM CITRATE+D3 PO) [id number]  Take 1,200 mg by mouth daily. [provider]  Active Self  carvedilol (COREG) 3.125 MG tablet [id number]  Take 1 tablet (3.125 mg total) by mouth 2 (two) times daily. Please keep scheduled appointment with cardiologist Lewayne Bunting, MD  Active   Cholecalciferol (VITAMIN D-3) 5000 units TABS [id number]   Take 10,000 Units by mouth daily. [provider]  Active Self  Coenzyme Q10 (COQ10) 200 MG CAPS [id number]  Take 200 mg by mouth daily. [provider]  Active Self  doxazosin (CARDURA) 2 MG tablet [id number]  TAKE 1 TABLET BY MOUTH DAILY Crenshaw, Madolyn Frieze, MD  Active   empagliflozin (JARDIANCE) 10 MG TABS tablet [id number]  Take 1 tablet (10 mg total) by mouth daily before breakfast. Lewayne Bunting, MD  Active   fexofenadine (ALLEGRA) 180 MG tablet [id number]  Take 180 mg by mouth daily as needed for allergies. [provider]  Active Self  finasteride (PROSCAR) 5 MG tablet [id number]  TAKE 1 TABLET BY MOUTH IN  THE MORNING Eubanks, Janene Harvey, NP  Active   furosemide (LASIX) 20 MG tablet [id number]  TAKE 1 TABLET BY MOUTH DAILY AS  NEEDED FOR LEG SWELLING Crenshaw, Madolyn Frieze, MD  Active   Iron, Ferrous Sulfate, 325 (65 Fe) MG TABS [id number]  Take 325 mg by mouth 2 (two) times daily. Sharon Seller, NP  Active   losartan (COZAAR) 25 MG tablet [id number]  Take 1 tablet (25 mg total) by mouth daily. Please keep scheduled appointment with cardiologist Lewayne Bunting, MD  Active   mexiletine (MEXITIL) 150 MG capsule [id number]  Take 1 capsule (150 mg total) by mouth 2 (two) times daily. Lanier Prude, MD  Active   pantoprazole (PROTONIX) 40 MG tablet [id number]  TAKE 1 TABLET BY MOUTH DAILY Sharon Seller, NP  Active   rosuvastatin (CRESTOR) 20 MG tablet [id number]  TAKE 1 TABLET BY MOUTH ONCE  DAILY FOR CHOLESTEROL Sharon Seller, NP  Active   warfarin (COUMADIN) 2.5  MG tablet [id number]  TAKE 1/2 TO 1 TABLET BY MOUTH  DAILY AS DIRECTED BY THE  COUMADIN CLINIC Crenshaw, Madolyn Frieze, MD  Active   Med List Note Maple Hudson, Clinton D, MD 07/15/14 2050): CPAP Advanced AutoPap 6-12            Home Care and Equipment/Supplies: Were Home Health Services Ordered?: NA Any new equipment or medical supplies ordered?: NA  Functional Questionnaire: Do you need assistance with  bathing/showering or dressing?: No Do you need assistance with meal preparation?: No Do you need assistance with eating?: No Do you have difficulty maintaining continence: No Do you need assistance with getting out of bed/getting out of a chair/moving?: No Do you have difficulty managing or taking your medications?: No  Follow up appointments reviewed: PCP Follow-up appointment confirmed?: Yes Date of PCP follow-up appointment?: 10/22/22 Follow-up Provider: Richarda Blade, NP Specialist Hospital Follow-up appointment confirmed?: Yes Date of Specialist follow-up appointment?: 10/17/22 Follow-Up Specialty Provider:: ortho Do you need transportation to your follow-up appointment?: No Do you understand care options if your condition(s) worsen?: Yes-patient verbalized understanding    SIGNATURE: [facility]

## 2022-10-19 ENCOUNTER — Ambulatory Visit: Payer: Medicare Other | Attending: Cardiology

## 2022-10-19 ENCOUNTER — Telehealth: Payer: Self-pay

## 2022-10-19 DIAGNOSIS — Z5181 Encounter for therapeutic drug level monitoring: Secondary | ICD-10-CM

## 2022-10-19 DIAGNOSIS — I4891 Unspecified atrial fibrillation: Secondary | ICD-10-CM

## 2022-10-19 LAB — POCT INR: INR: 1.8 — AB (ref 2.0–3.0)

## 2022-10-19 NOTE — Patient Instructions (Signed)
TAKE ANOTHER 0.5 TABLET TODAY THEN DECREASE Warfarin to 1/2 tablet daily Recheck INR in 2 weeks  Coumadin Clinic [phone]

## 2022-10-19 NOTE — Telephone Encounter (Signed)
I called and discussed recommendations with patient. He verbalized his understanding.

## 2022-10-19 NOTE — Telephone Encounter (Signed)
Mucinex by mouth twice daily with full glass of water Can use tylenol 650 mg by mouth every 6 hours as needed pain, fever or sore throat.

## 2022-10-19 NOTE — Telephone Encounter (Signed)
Patient called complaining of sore throat and lots of mucous. No fever.He says that he will wait until his appointment on Monday,but would like to know if there is anything he can do until then.  Message sent to Abbey Chatters, NP

## 2022-10-21 ENCOUNTER — Emergency Department (HOSPITAL_BASED_OUTPATIENT_CLINIC_OR_DEPARTMENT_OTHER)
Admission: EM | Admit: 2022-10-21 | Discharge: 2022-10-21 | Disposition: A | Discharge status: Home / Self Care | Payer: Medicare Other | Attending: Emergency Medicine | Admitting: Emergency Medicine

## 2022-10-21 ENCOUNTER — Other Ambulatory Visit: Payer: Self-pay

## 2022-10-21 ENCOUNTER — Encounter (HOSPITAL_BASED_OUTPATIENT_CLINIC_OR_DEPARTMENT_OTHER): Payer: Self-pay | Admitting: Emergency Medicine

## 2022-10-21 ENCOUNTER — Emergency Department (HOSPITAL_BASED_OUTPATIENT_CLINIC_OR_DEPARTMENT_OTHER): Payer: Medicare Other

## 2022-10-21 DIAGNOSIS — N179 Acute kidney failure, unspecified: Secondary | ICD-10-CM | POA: Diagnosis not present

## 2022-10-21 DIAGNOSIS — Z7901 Long term (current) use of anticoagulants: Secondary | ICD-10-CM | POA: Diagnosis not present

## 2022-10-21 DIAGNOSIS — J45909 Unspecified asthma, uncomplicated: Secondary | ICD-10-CM | POA: Insufficient documentation

## 2022-10-21 DIAGNOSIS — Z7951 Long term (current) use of inhaled steroids: Secondary | ICD-10-CM | POA: Insufficient documentation

## 2022-10-21 DIAGNOSIS — R1310 Dysphagia, unspecified: Secondary | ICD-10-CM | POA: Diagnosis present

## 2022-10-21 DIAGNOSIS — I2510 Atherosclerotic heart disease of native coronary artery without angina pectoris: Secondary | ICD-10-CM | POA: Insufficient documentation

## 2022-10-21 DIAGNOSIS — I4891 Unspecified atrial fibrillation: Secondary | ICD-10-CM | POA: Diagnosis not present

## 2022-10-21 DIAGNOSIS — R059 Cough, unspecified: Secondary | ICD-10-CM | POA: Diagnosis not present

## 2022-10-21 DIAGNOSIS — K209 Esophagitis, unspecified without bleeding: Secondary | ICD-10-CM

## 2022-10-21 DIAGNOSIS — R791 Abnormal coagulation profile: Secondary | ICD-10-CM | POA: Insufficient documentation

## 2022-10-21 DIAGNOSIS — I509 Heart failure, unspecified: Secondary | ICD-10-CM | POA: Insufficient documentation

## 2022-10-21 DIAGNOSIS — J029 Acute pharyngitis, unspecified: Secondary | ICD-10-CM | POA: Diagnosis not present

## 2022-10-21 LAB — CBC WITH DIFFERENTIAL/PLATELET
Abs Immature Granulocytes: 0.03 10*3/uL (ref 0.00–0.07)
Basophils Absolute: 0 10*3/uL (ref 0.0–0.1)
Basophils Relative: 0 %
Eosinophils Absolute: 0.1 10*3/uL (ref 0.0–0.5)
Eosinophils Relative: 1 %
HCT: 37.9 % — ABNORMAL LOW (ref 39.0–52.0)
Hemoglobin: 12.7 g/dL — ABNORMAL LOW (ref 13.0–17.0)
Immature Granulocytes: 1 %
Lymphocytes Relative: 22 %
Lymphs Abs: 1.4 10*3/uL (ref 0.7–4.0)
MCH: 30.8 pg (ref 26.0–34.0)
MCHC: 33.5 g/dL (ref 30.0–36.0)
MCV: 92 fL (ref 80.0–100.0)
Monocytes Absolute: 0.8 10*3/uL (ref 0.1–1.0)
Monocytes Relative: 13 %
Neutro Abs: 3.9 10*3/uL (ref 1.7–7.7)
Neutrophils Relative %: 63 %
Platelets: 217 10*3/uL (ref 150–400)
RBC: 4.12 MIL/uL — ABNORMAL LOW (ref 4.22–5.81)
RDW: 12 % (ref 11.5–15.5)
WBC: 6.2 10*3/uL (ref 4.0–10.5)
nRBC: 0 % (ref 0.0–0.2)

## 2022-10-21 LAB — BASIC METABOLIC PANEL
Anion gap: 9 (ref 5–15)
BUN: 30 mg/dL — ABNORMAL HIGH (ref 8–23)
CO2: 23 mmol/L (ref 22–32)
Calcium: 9.5 mg/dL (ref 8.9–10.3)
Chloride: 105 mmol/L (ref 98–111)
Creatinine, Ser: 1.7 mg/dL — ABNORMAL HIGH (ref 0.61–1.24)
GFR, Estimated: 39 mL/min — ABNORMAL LOW (ref >60)
Glucose, Bld: 111 mg/dL — ABNORMAL HIGH (ref 70–99)
Potassium: 4.3 mmol/L (ref 3.5–5.1)
Sodium: 137 mmol/L (ref 135–145)

## 2022-10-21 LAB — PROTIME-INR
INR: 2.3 — ABNORMAL HIGH (ref 0.8–1.2)
Prothrombin Time: 25.5 seconds — ABNORMAL HIGH (ref 11.4–15.2)

## 2022-10-21 LAB — GROUP A STREP BY PCR: Group A Strep by PCR: NOT DETECTED

## 2022-10-21 MED ORDER — MAALOX MAX 400-400-40 MG/5ML PO SUSP: 10.0000 mL | Freq: Four times a day (QID) | ORAL | 0 refills | Status: AC | PRN

## 2022-10-21 MED ORDER — ALUM & MAG HYDROXIDE-SIMETH 200-200-20 MG/5ML PO SUSP
30.0000 mL | Freq: Once | ORAL | Status: AC
  Administered 2022-10-21: 30 mL via ORAL
  Filled 2022-10-21: qty 30

## 2022-10-21 MED ORDER — PANTOPRAZOLE SODIUM 40 MG PO TBEC: 40.0000 mg | DELAYED_RELEASE_TABLET | Freq: Every day | ORAL | 1 refills | Status: DC

## 2022-10-21 MED ORDER — PANTOPRAZOLE SODIUM 40 MG IV SOLR
40.0000 mg | Freq: Once | INTRAVENOUS | Status: AC
  Administered 2022-10-21: 40 mg via INTRAVENOUS
  Filled 2022-10-21: qty 10

## 2022-10-21 MED ORDER — CARVEDILOL 6.25 MG PO TABS
3.1250 mg | ORAL_TABLET | Freq: Once | ORAL | Status: AC
  Administered 2022-10-21: 3.125 mg via ORAL
  Filled 2022-10-21: qty 1

## 2022-10-21 MED ORDER — SODIUM CHLORIDE 0.9 % IV BOLUS (SEPSIS)
500.0000 mL | Freq: Once | INTRAVENOUS | Status: AC
  Administered 2022-10-21: 500 mL via INTRAVENOUS

## 2022-10-21 MED ORDER — FAMOTIDINE 20 MG PO TABS: 20.0000 mg | ORAL_TABLET | Freq: Two times a day (BID) | ORAL | 0 refills | Status: DC

## 2022-10-21 MED ORDER — DEXAMETHASONE SODIUM PHOSPHATE 10 MG/ML IJ SOLN
10.0000 mg | Freq: Once | INTRAMUSCULAR | Status: AC
  Administered 2022-10-21: 10 mg via INTRAVENOUS
  Filled 2022-10-21: qty 1

## 2022-10-21 MED ORDER — MAALOX MAX 400-400-40 MG/5ML PO SUSP: 10.0000 mL | Freq: Four times a day (QID) | ORAL | 0 refills | Status: DC | PRN

## 2022-10-21 MED ORDER — ACETAMINOPHEN 500 MG PO TABS
1000.0000 mg | ORAL_TABLET | Freq: Once | ORAL | Status: DC
  Filled 2022-10-21: qty 2

## 2022-10-21 NOTE — ED Notes (Signed)
Patient transported to X-ray 

## 2022-10-21 NOTE — ED Provider Notes (Signed)
[place] [facility] Provider Note   CSN: [id number] Arrival date & time: 10/21/22  0133     History  Chief Complaint  Patient presents with   Throat tightness    Brent Griffith is a 87 y.o. male.  With PMH tonsillectomy, GERD and history of PUD, CAD, CHF, asthma, A-fib on Coumadin who presents with difficulty with swallowing and pain with swallowing for the past week.  He has been taking his medications and keeping fluids and food down but just endorses pain with swallowing.  He did feel like he had a pill get stuck in his throat earlier this week but feels like it passed.  He has history of GERD and hiatal hernia and is unsure if he has been taking his antacid medicines.  He denies any melena or bright red blood per rectum.  He has had no fevers, no chills, no congestion, no rhinorrhea, no coughing.  Denies any associated chest pain or shortness of breath.  He has had no wheezing or rash.  HPI     Home Medications Prior to Admission medications   Medication Sig Start Date End Date Taking? Authorizing Provider  albuterol (VENTOLIN HFA) 108 (90 Base) MCG/ACT inhaler Inhale 1-2 puffs into the lungs every 6 (six) hours as needed for wheezing or shortness of breath. 07/03/22   Fargo, Amy E, NP  alum & mag hydroxide-simeth (MAALOX MAX) 400-400-40 MG/5ML suspension Take 10 mLs by mouth every 6 (six) hours as needed (pain with swallowing and reflux). 10/21/22   Mardene Sayer, MD  amiodarone (PACERONE) 200 MG tablet TAKE 1 TABLET BY MOUTH DAILY 08/07/22   Lewayne Bunting, MD  Calcium Citrate-Vitamin D (EQ CALCIUM CITRATE+D3 PO) Take 1,200 mg by mouth daily.    [provider]  carvedilol (COREG) 3.125 MG tablet Take 1 tablet (3.125 mg total) by mouth 2 (two) times daily. Please keep scheduled appointment with cardiologist 05/25/22   Lewayne Bunting, MD  Cholecalciferol (VITAMIN D-3) 5000 units TABS Take 10,000 Units by mouth daily.    [provider]  Coenzyme Q10 (COQ10) 200 MG CAPS Take 200 mg by mouth daily.    [provider]  cyclobenzaprine (FLEXERIL) 5 MG tablet Take 2 tablets (10 mg total) by mouth 2 (two) times daily as needed. 10/03/22   Tegeler, Canary Brim, MD  doxazosin (CARDURA) 2 MG tablet TAKE 1 TABLET BY MOUTH DAILY 03/27/22   Lewayne Bunting, MD  empagliflozin (JARDIANCE) 10 MG TABS tablet Take 1 tablet (10 mg total) by mouth daily before breakfast. 04/16/22   Lewayne Bunting, MD  famotidine (PEPCID) 20 MG tablet Take 1 tablet (20 mg total) by mouth 2 (two) times daily. 10/21/22 11/20/22  Mardene Sayer, MD  fexofenadine (ALLEGRA) 180 MG tablet Take 180 mg by mouth daily as needed for allergies.    [provider]  finasteride (PROSCAR) 5 MG tablet TAKE 1 TABLET BY MOUTH IN  THE MORNING 10/06/21   Sharon Seller, NP  furosemide (LASIX) 20 MG tablet TAKE 1 TABLET BY MOUTH DAILY AS  NEEDED FOR LEG SWELLING 05/01/22   Lewayne Bunting, MD  HYDROcodone-acetaminophen (NORCO/VICODIN) 5-325 MG tablet Take 1 tablet by mouth every 6 (six) hours as needed for moderate pain. 10/09/22   Vanetta Mulders, MD  Iron, Ferrous Sulfate, 325 (65 Fe) MG TABS Take 325 mg by mouth 2 (two) times daily. 09/13/21   Sharon Seller, NP  losartan (COZAAR) 25  MG tablet Take 1 tablet (25 mg total) by mouth daily. Please keep scheduled appointment with cardiologist 05/22/22   Lewayne Bunting, MD  metaxalone (SKELAXIN) 800 MG tablet Take 1 tablet (800 mg total) by mouth 3 (three) times daily. 10/09/22   Vanetta Mulders, MD  mexiletine (MEXITIL) 150 MG capsule Take 1 capsule (150 mg total) by mouth 2 (two) times daily. 02/19/22   Lanier Prude, MD  pantoprazole (PROTONIX) 40 MG tablet TAKE 1 TABLET BY MOUTH DAILY 04/23/22   Sharon Seller, NP  pantoprazole (PROTONIX) 40 MG tablet Take 1 tablet (40 mg total) by mouth daily. 10/21/22 12/20/22  Mardene Sayer, MD  rosuvastatin (CRESTOR) 20 MG tablet TAKE 1  TABLET BY MOUTH ONCE  DAILY FOR CHOLESTEROL 07/24/22   Sharon Seller, NP  warfarin (COUMADIN) 2.5 MG tablet TAKE 1/2 TO 1 TABLET BY MOUTH  DAILY AS DIRECTED BY THE  COUMADIN CLINIC 07/04/22   Lewayne Bunting, MD      Allergies    Compazine [prochlorperazine edisylate]    Review of Systems   Review of Systems  Physical Exam Updated Vital Signs BP (!) 143/69   Pulse 68   Temp 98.7 F (37.1 C) (Oral)   Resp (!) 22   Ht 5\' 2"  (1.575 m)   Wt 50.3 kg   SpO2 98%   BMI 20.30 kg/m  Physical Exam Constitutional: Alert and oriented.  Chronically ill-appearing but no acute distress Eyes: Conjunctivae are normal. ENT      Head: Normocephalic and atraumatic.      Mouth/Throat: Mucous membranes are moist.  Uvula midline with no erythema and no erythema of oropharynx or exudate present.  Tonsils surgically absent.      Neck: No stridor.  No drooling, no tripoding Cardiovascular: S1, S2, regular rate Respiratory: Normal respiratory effort. Breath sounds are normal.  O2 sat 100 on RA Gastrointestinal: Soft and nontender.  Musculoskeletal: Normal range of motion in all extremities. No pitting edema of lower extremities Neurologic: Normal speech and language.  Moving all 4 extremities equally.  No facial droop.  Sensation grossly intact.  No gross focal neurologic deficits are appreciated. Skin: Skin is warm, dry and intact. No rash noted. Psychiatric: Mood and affect are normal. Speech and behavior are normal.  ED Results / Procedures / Treatments   Labs (all labs ordered are listed, but only abnormal results are displayed) Labs Reviewed  BASIC METABOLIC PANEL - Abnormal; Notable for the following components:      Result Value   Glucose, Bld 111 (*)    BUN 30 (*)    Creatinine, Ser 1.70 (*)    GFR, Estimated 39 (*)    All other components within normal limits  CBC WITH DIFFERENTIAL/PLATELET - Abnormal; Notable for the following components:   RBC 4.12 (*)    Hemoglobin 12.7 (*)     HCT 37.9 (*)    All other components within normal limits  PROTIME-INR - Abnormal; Notable for the following components:   Prothrombin Time 25.5 (*)    INR 2.3 (*)    All other components within normal limits  GROUP A STREP BY PCR    EKG None  Radiology DG Neck Soft Tissue  Result Date: 10/21/2022 CLINICAL DATA:  Sore throat and cough EXAM: NECK SOFT TISSUES - 1+ VIEW COMPARISON:  None Available. FINDINGS: No airway obstructive changes are seen. The epiglottis appears within normal limits. No prevertebral soft tissue swelling is noted. No bony abnormality is seen.  IMPRESSION: No acute abnormality noted. Electronically Signed   By: Alcide Clever M.D.   On: 10/21/2022 02:24   DG Chest 2 View  Result Date: 10/21/2022 CLINICAL DATA:  Sore throat and cough EXAM: CHEST - 2 VIEW COMPARISON:  08/09/2021 FINDINGS: Cardiac shadow is stable. Postsurgical changes are again seen. The lungs are well aerated bilaterally. No focal infiltrate or effusion is seen. No acute bony abnormality is noted. IMPRESSION: No active cardiopulmonary disease. Electronically Signed   By: Alcide Clever M.D.   On: 10/21/2022 02:24    Procedures Procedures    Medications Ordered in ED Medications  acetaminophen (TYLENOL) tablet 1,000 mg (1,000 mg Oral Patient Refused/Not Given 10/21/22 0257)  dexamethasone (DECADRON) injection 10 mg (10 mg Intravenous Given 10/21/22 0213)  sodium chloride 0.9 % bolus 500 mL (0 mLs Intravenous Stopped 10/21/22 0332)  pantoprazole (PROTONIX) injection 40 mg (40 mg Intravenous Given 10/21/22 0258)  carvedilol (COREG) tablet 3.125 mg (3.125 mg Oral Given 10/21/22 0257)  alum & mag hydroxide-simeth (MAALOX/MYLANTA) 200-200-20 MG/5ML suspension 30 mL (30 mLs Oral Given 10/21/22 0318)    ED Course/ Medical Decision Making/ A&P                             Medical Decision Making Brent Griffith is a 87 y.o. male.  With PMH tonsillectomy, GERD and history of PUD, CAD, CHF, asthma, A-fib on  Coumadin who presents with difficulty with swallowing and pain with swallowing for the past week.   Patient has symptoms more consistent with odynophagia and dysphagia ongoing for the past week.  In the setting of known history of reflux and hiatal hernia, suspect underlying esophageal irritation and esophagitis as source.   He is able to swallow medications and keep meds down, I have low suspicion for esophageal obstruction.  His oropharynx exam was benign and he has no infectious symptoms suggestive of pharyngitis.  Strep test negative and normal white blood cell count 6.2 with no left shift present.  He has no red flag signs or symptoms suggestive of deep space infection such as epiglottitis or RPA in addition he had a neck x-ray obtained which I personally reviewed showing no acute findings.  Chest x-ray also obtained which I personally reviewed no evidence of pneumonia or mediastinal widening.  He denies any active chest pain or symptoms concerning for ACS.  He has no angioedema, rash, wheezing or any other symptoms suggestive of allergic reaction.  Hemoglobin is 12.7 and stable, not concern for active GI bleed or PUD.  He does have AKI creatinine 1.7 from 1.3 baseline as well as elevated BUN 38 in the setting of decreased p.o. intake due to odynophagia and dysphagia.  Given 500 cc IV fluid bolus here.  Patient had relief of symptoms with GI cocktail and IV Protonix.  He was able to keep down his medications as described above.  We discussed his AKI and lower kidney function and avoidance of any nephrotoxic medications as well as follow-up with PCP for repeat creatinine check.  He will also continue daily Protonix and Pepcid and Maalox as needed prescribed today.  He will make an appointment with his GI Dr. Russella Dar for outpatient endoscopy and follow-up.  Return precautions discussed.  He is in agreement with plan.  Amount and/or Complexity of Data Reviewed Labs: ordered. Radiology:  ordered.  Risk OTC drugs. Prescription drug management.      Final Clinical Impression(s) / ED Diagnoses Final  diagnoses:  Odynophagia  Esophagitis  AKI (acute kidney injury) (HCC)    Rx / DC Orders ED Discharge Orders          Ordered    pantoprazole (PROTONIX) 40 MG tablet  Daily,   Status:  Discontinued        10/21/22 0334    alum & mag hydroxide-simeth (MAALOX MAX) 400-400-40 MG/5ML suspension  Every 6 hours PRN,   Status:  Discontinued        10/21/22 0334    famotidine (PEPCID) 20 MG tablet  2 times daily,   Status:  Discontinued        10/21/22 0334    pantoprazole (PROTONIX) 40 MG tablet  Daily        10/21/22 0349    famotidine (PEPCID) 20 MG tablet  2 times daily        10/21/22 0349    alum & mag hydroxide-simeth (MAALOX MAX) 400-400-40 MG/5ML suspension  Every 6 hours PRN        10/21/22 0349              Mardene Sayer, MD 10/21/22 [phone]

## 2022-10-21 NOTE — ED Triage Notes (Signed)
Pt states "it feels like my throat wants to close up" for ~ 1 hr. Visitor states he is "spitting up clear mucous strings". Pt denies throat pain. Pt is able to speak and answer questions with a shaky voice. Pt swallowing his saliva. Oxygen saturation 100% on RA.

## 2022-10-21 NOTE — Discharge Instructions (Addendum)
Take the medications as prescribed today for odynophagia (painful swallowing) and esophagitis or inflammation of your esophagus.  Make an appointment with your GI doctor regarding your visit to the ER today for outpatient endoscopy and possible swallow study.  Follow-up with soft food eating plan.  Make an appointment with your primary care doctor for recheck of your kidney function levels.  Make sure you are drinking plenty of fluids.  Come back if any severe worsening pain, chest pain, shortness of breath, fainting, bloody stools, vomiting blood, or any other symptoms concerning to you.

## 2022-10-22 ENCOUNTER — Encounter: Payer: Self-pay | Admitting: Family

## 2022-10-22 ENCOUNTER — Ambulatory Visit (INDEPENDENT_AMBULATORY_CARE_PROVIDER_SITE_OTHER): Payer: Medicare Other | Admitting: Family

## 2022-10-22 VITALS — BP 148/80 | HR 53 | Temp 96.7°F | Ht 62.0 in | Wt 116.6 lb

## 2022-10-22 DIAGNOSIS — N1831 Chronic kidney disease, stage 3a: Secondary | ICD-10-CM | POA: Diagnosis not present

## 2022-10-22 DIAGNOSIS — K219 Gastro-esophageal reflux disease without esophagitis: Secondary | ICD-10-CM

## 2022-10-22 DIAGNOSIS — D508 Other iron deficiency anemias: Secondary | ICD-10-CM | POA: Diagnosis not present

## 2022-10-22 LAB — CBC WITH DIFFERENTIAL/PLATELET
Absolute Monocytes: 951 cells/uL — ABNORMAL HIGH (ref 200–950)
Basophils Absolute: 10 cells/uL (ref 0–200)
Basophils Relative: 0.1 %
Hemoglobin: 12.6 g/dL — ABNORMAL LOW (ref 13.2–17.1)
Lymphs Abs: 1019 cells/uL (ref 850–3900)
MCH: 31.8 pg (ref 27.0–33.0)
Monocytes Relative: 9.7 %
Platelets: 235 10*3/uL (ref 140–400)
RBC: 3.96 10*6/uL — ABNORMAL LOW (ref 4.20–5.80)
WBC: 9.8 10*3/uL (ref 3.8–10.8)

## 2022-10-22 NOTE — Progress Notes (Signed)
Provider: Talis Iwan FNP-C  Sharon Seller, NP  Patient Care Team: Sharon Seller, NP as PCP - General (Geriatric Medicine) Lewayne Bunting, MD as PCP - Cardiology (Cardiology) Lanier Prude, MD as PCP - Electrophysiology (Cardiology) Melina Fiddler, MD as Consulting Physician (Sports Medicine) Ernesto Rutherford, MD as Referring Physician (Ophthalmology)  Extended Emergency Contact Information Primary Emergency Contact: Ellen,Loretta Address: [address]          [place], [place] [address] [place] of [place] Home Phone: [phone] Mobile Phone: [phone] Relation: Spouse  Code Status:  Full Code  Goals of care: Advanced Directive information    10/21/2022    1:43 AM  Advanced Directives  Does Patient Have a Medical Advance Directive? No     Chief Complaint  Patient presents with   Acute Visit    ED follow up     HPI:  Pt is a 87 y.o. male seen today for an acute visit for ED follow up 10/21/2022 for odynophagia after presenting with complains of difficulty swallowing and pain with swallowing x 1 week.He felt like a pill was stuck in his throat but passed it. His symptoms were thought to be consistent with odynophagia and dysphagia given history of reflux and hiatal hernia.underlying esophageal irritation and esophagitis was suspected.strep test was negative with normal WBC.Neck and chest X-ray were negative.Hgb was 12.7 without any concerns for GI bleed.GI cocktail and IV Protonix was given with much improvement of symptoms.He was able to keep down his medication.He was advised to continue on Protonix ,Pepcid and Maalox PRN. Still spitting up mucus and feels like choking.He was advised to eat soft foods. CR 1.7 previous 1.3 thought due to decrease oral intake due to odynophagia and dysphagia. Bolus IV fluids was given. Will make appointment with GI Dr.Stark.   Past Medical History:  Diagnosis Date   Acute gastric ulcer without mention of  hemorrhage, perforation, or obstruction    Allergic rhinitis, cause unspecified    Anemia, unspecified    Arthritis    Asthma    Blood transfusion without reported diagnosis    CAD (coronary artery disease)    Diaphragmatic hernia without mention of obstruction or gangrene    Elevated prostate specific antigen (PSA)    Enlarged prostate    Esophageal reflux    Extrinsic asthma, unspecified    Herpes zoster without mention of complication    Hyperlipidemia    Hypersomnia with sleep apnea, unspecified    Impotence of organic origin    Kyphosis    Lumbago    Obstructive sleep apnea (adult) (pediatric)    Pneumonia    hx of years ago    Senile osteoporosis    Trigger finger (acquired)    Tubular adenoma of colon 05/2013   Unspecified essential hypertension    Unspecified sleep apnea    CPAP- settings 4-12    Unspecified vitamin D deficiency    Past Surgical History:  Procedure Laterality Date   CARDIAC CATHETERIZATION  03/04/2009   Dr Dorris Fetch   COLONOSCOPY     CORONARY ARTERY BYPASS GRAFT  2010   CYSTOSCOPY WITH RETROGRADE PYELOGRAM, URETEROSCOPY AND STENT PLACEMENT Right 10/04/2013   Procedure: CYSTOSCOPY WITH RETROGRADE PYELOGRAM,  AND STENT PLACEMENT;  Surgeon: Jerilee Field, MD;  Location: [facility];  Service: Urology;  Laterality: Right;   DENTAL SURGERY N/A    MOLE REMOVAL  1958   chin   ROTATOR CUFF REPAIR Left 04/1998   with bone spur removed,  Dr Madelon Lips   TONSILLECTOMY  1945   TRANSURETHRAL RESECTION OF PROSTATE N/A 09/24/2013   Procedure: TRANSURETHRAL RESECTION OF THE PROSTATE (TURP) WITH GYRUS (STAGED RIGHT LATERAL AND MEDIAN LOBE);  Surgeon: Kathi Ludwig, MD;  Location: [facility];  Service: Urology;  Laterality: N/A;   UPPER GI ENDOSCOPY      Allergies  Allergen Reactions   Compazine [Prochlorperazine Edisylate] Anxiety    Outpatient Encounter Medications as of 10/22/2022  Medication Sig   albuterol (VENTOLIN HFA) 108 (90 Base) MCG/ACT inhaler  Inhale 1-2 puffs into the lungs every 6 (six) hours as needed for wheezing or shortness of breath.   alum & mag hydroxide-simeth (MAALOX MAX) 400-400-40 MG/5ML suspension Take 10 mLs by mouth every 6 (six) hours as needed (pain with swallowing and reflux).   Calcium Citrate-Vitamin D (EQ CALCIUM CITRATE+D3 PO) Take 1,200 mg by mouth daily.   carvedilol (COREG) 3.125 MG tablet Take 1 tablet (3.125 mg total) by mouth 2 (two) times daily. Please keep scheduled appointment with cardiologist   Cholecalciferol (VITAMIN D-3) 5000 units TABS Take 10,000 Units by mouth daily.   Coenzyme Q10 (COQ10) 200 MG CAPS Take 200 mg by mouth daily.   cyclobenzaprine (FLEXERIL) 5 MG tablet Take 2 tablets (10 mg total) by mouth 2 (two) times daily as needed.   doxazosin (CARDURA) 2 MG tablet TAKE 1 TABLET BY MOUTH DAILY   empagliflozin (JARDIANCE) 10 MG TABS tablet Take 1 tablet (10 mg total) by mouth daily before breakfast.   famotidine (PEPCID) 20 MG tablet Take 1 tablet (20 mg total) by mouth 2 (two) times daily.   fexofenadine (ALLEGRA) 180 MG tablet Take 180 mg by mouth daily as needed for allergies.   finasteride (PROSCAR) 5 MG tablet TAKE 1 TABLET BY MOUTH IN  THE MORNING   furosemide (LASIX) 20 MG tablet TAKE 1 TABLET BY MOUTH DAILY AS  NEEDED FOR LEG SWELLING   HYDROcodone-acetaminophen (NORCO/VICODIN) 5-325 MG tablet Take 1 tablet by mouth every 6 (six) hours as needed for moderate pain.   Iron, Ferrous Sulfate, 325 (65 Fe) MG TABS Take 325 mg by mouth 2 (two) times daily.   losartan (COZAAR) 25 MG tablet Take 1 tablet (25 mg total) by mouth daily. Please keep scheduled appointment with cardiologist   metaxalone (SKELAXIN) 800 MG tablet Take 1 tablet (800 mg total) by mouth 3 (three) times daily.   mexiletine (MEXITIL) 150 MG capsule Take 1 capsule (150 mg total) by mouth 2 (two) times daily.   pantoprazole (PROTONIX) 40 MG tablet TAKE 1 TABLET BY MOUTH DAILY   rosuvastatin (CRESTOR) 20 MG tablet TAKE 1  TABLET BY MOUTH ONCE  DAILY FOR CHOLESTEROL   warfarin (COUMADIN) 2.5 MG tablet TAKE 1/2 TO 1 TABLET BY MOUTH  DAILY AS DIRECTED BY THE  [facility] CLINIC   [DISCONTINUED] amiodarone (PACERONE) 200 MG tablet TAKE 1 TABLET BY MOUTH DAILY   [DISCONTINUED] pantoprazole (PROTONIX) 40 MG tablet Take 1 tablet (40 mg total) by mouth daily.   No facility-administered encounter medications on file as of 10/22/2022.    Review of Systems  Constitutional:  Negative for appetite change, chills, fatigue, fever and unexpected weight change.  HENT:  Negative for congestion, dental problem, ear discharge, ear pain, facial swelling, hearing loss, nosebleeds, postnasal drip, rhinorrhea, sinus pressure, sinus pain, sneezing, sore throat, tinnitus and trouble swallowing.   Eyes:  Negative for pain, discharge, redness, itching and visual disturbance.  Respiratory:  Negative for cough, chest tightness, shortness of breath and  wheezing.   Cardiovascular:  Negative for chest pain, palpitations and leg swelling.  Gastrointestinal:  Negative for abdominal distention, abdominal pain, blood in stool, constipation, diarrhea, nausea and vomiting.       Still spitting up mucus and feels like choking.He was advised to eat soft foods.Has upcoming appointment with GI    Genitourinary:  Negative for difficulty urinating, dysuria, flank pain, frequency and urgency.  Musculoskeletal:  Negative for arthralgias, back pain, gait problem, joint swelling, myalgias, neck pain and neck stiffness.  Skin:  Negative for color change, pallor, rash and wound.  Neurological:  Negative for dizziness, weakness, light-headedness, numbness and headaches.  Hematological:  Does not bruise/bleed easily.    Immunization History  Administered Date(s) Administered   Fluad Quad(high Dose 65+) 01/25/2020, 02/20/2021, 03/16/2022   Influenza Split 01/30/2012, 02/10/2014   Influenza Whole 02/17/2009, 02/18/2013   Influenza, High Dose Seasonal PF  03/19/2018, 02/02/2019   Influenza, Quadrivalent, Recombinant, Inj, Pf 02/23/2017   Influenza,inj,Quad PF,6+ Mos 02/23/2017   Influenza-Unspecified 02/08/2014, 02/24/2015, 03/16/2016   Moderna Sars-Covid-2 Vaccination 07/21/2019, 08/18/2019, 05/03/2020, 09/28/2020, 01/11/2021   Pneumococcal Conjugate-13 04/09/2013   Pneumococcal Polysaccharide-23 10/18/2004   Td 09/29/1990   Tdap 07/25/2011   Zoster Recombinat (Shingrix) 10/09/2016, 04/06/2021, 07/04/2021   Pertinent  Health Maintenance Due  Topic Date Due   INFLUENZA VACCINE  12/06/2022   Colonoscopy  Discontinued      05/17/2022    5:28 PM 05/21/2022    2:54 PM 05/25/2022    2:18 PM 07/03/2022    2:29 PM 10/22/2022    2:56 PM  Fall Risk  Falls in the past year?  0 0 0 1  Was there an injury with Fall?  0 0 0 0  Fall Risk Category Calculator  0 0 0 2  (RETIRED) Patient Fall Risk Level Low fall risk      Patient at Risk for Falls Due to  No Fall Risks No Fall Risks No Fall Risks History of fall(s)  Fall risk Follow up  Falls evaluation completed Falls evaluation completed Falls evaluation completed Falls evaluation completed   Functional Status Survey:    Vitals:   10/22/22 1453 10/22/22 1551  BP: (!) 164/98 (!) 148/80  Pulse: (!) 53   Temp: (!) 96.7 F (35.9 C)   SpO2: 97%   Weight: 116 lb 9.6 oz (52.9 kg)   Height: 5\' 2"  (1.575 m)    Body mass index is 21.33 kg/m. Physical Exam Vitals reviewed.  Constitutional:      General: He is not in acute distress.    Appearance: Normal appearance. He is normal weight. He is not ill-appearing or diaphoretic.  HENT:     Head: Normocephalic.     Right Ear: Tympanic membrane, ear canal and external ear normal. There is no impacted cerumen.     Left Ear: Tympanic membrane, ear canal and external ear normal. There is no impacted cerumen.     Nose: Nose normal. No congestion or rhinorrhea.     Mouth/Throat:     Mouth: Mucous membranes are moist.     Pharynx: Oropharynx is clear.  No oropharyngeal exudate or posterior oropharyngeal erythema.  Eyes:     General: No scleral icterus.       Right eye: No discharge.        Left eye: No discharge.     Extraocular Movements: Extraocular movements intact.     Conjunctiva/sclera: Conjunctivae normal.     Pupils: Pupils are equal, round, and reactive to light.  Neck:     Vascular: No carotid bruit.  Cardiovascular:     Rate and Rhythm: Normal rate and regular rhythm.     Pulses: Normal pulses.     Heart sounds: Normal heart sounds. No murmur heard.    No friction rub. No gallop.  Pulmonary:     Effort: Pulmonary effort is normal. No respiratory distress.     Breath sounds: Normal breath sounds. No wheezing, rhonchi or rales.  Chest:     Chest wall: No tenderness.  Abdominal:     General: Bowel sounds are normal. There is no distension.     Palpations: Abdomen is soft. There is no mass.     Tenderness: There is no abdominal tenderness. There is no right CVA tenderness, left CVA tenderness, guarding or rebound.  Musculoskeletal:        General: No swelling or tenderness. Normal range of motion.     Cervical back: Normal range of motion. No rigidity or tenderness.     Right lower leg: No edema.     Left lower leg: No edema.  Lymphadenopathy:     Cervical: No cervical adenopathy.  Skin:    General: Skin is warm and dry.     Coloration: Skin is not pale.     Findings: No bruising, erythema, lesion or rash.  Neurological:     Mental Status: He is alert and oriented to person, place, and time.     Cranial Nerves: No cranial nerve deficit.     Sensory: No sensory deficit.     Motor: No weakness.     Coordination: Coordination normal.     Gait: Gait normal.  Psychiatric:        Mood and Affect: Mood normal.        Speech: Speech normal.        Behavior: Behavior normal.        Thought Content: Thought content normal.        Judgment: Judgment normal.     Labs reviewed: Recent Labs    08/31/22 1515  10/21/22 0149 10/22/22 1557  NA 138 137 139  K 3.9 4.3 4.4  CL 103 105 104  CO2 23 23 28   GLUCOSE 128* 111* 89  BUN 25 30* 34*  CREATININE 1.16 1.70* 1.36*  CALCIUM 8.8 9.5 9.3   Recent Labs    03/16/22 1455 08/31/22 1515  AST 29 27  ALT 30 23  ALKPHOS  --  59  BILITOT 0.9 1.0  PROT 6.1 5.8*  ALBUMIN  --  4.0   Recent Labs    09/14/22 1516 10/21/22 0149 10/22/22 1557  WBC 4.9 6.2 9.8  NEUTROABS 3,548 3.9 7,820*  HGB 12.2* 12.7* 12.6*  HCT 36.0* 37.9* 36.8*  MCV 94.7 92.0 92.9  PLT 161 217 235   Lab Results  Component Value Date   TSH 1.200 08/31/2022   Lab Results  Component Value Date   HGBA1C 5.6 07/28/2012   Lab Results  Component Value Date   CHOL 122 03/16/2022   HDL 62 03/16/2022   LDLCALC 46 03/16/2022   TRIG 65 03/16/2022   CHOLHDL 2.0 03/16/2022    Significant Diagnostic Results in last 30 days:  DG Neck Soft Tissue  Result Date: 10/21/2022 CLINICAL DATA:  Sore throat and cough EXAM: NECK SOFT TISSUES - 1+ VIEW COMPARISON:  None Available. FINDINGS: No airway obstructive changes are seen. The epiglottis appears within normal limits. No prevertebral soft tissue swelling is noted. No bony abnormality  is seen. IMPRESSION: No acute abnormality noted. Electronically Signed   By: Alcide Clever M.D.   On: 10/21/2022 02:24   DG Chest 2 View  Result Date: 10/21/2022 CLINICAL DATA:  Sore throat and cough EXAM: CHEST - 2 VIEW COMPARISON:  08/09/2021 FINDINGS: Cardiac shadow is stable. Postsurgical changes are again seen. The lungs are well aerated bilaterally. No focal infiltrate or effusion is seen. No acute bony abnormality is noted. IMPRESSION: No active cardiopulmonary disease. Electronically Signed   By: Alcide Clever M.D.   On: 10/21/2022 02:24   CT Thoracic Spine Wo Contrast  Result Date: 10/03/2022 CLINICAL DATA:  Mid-back pain, prior compression fracture Larey Seat yesterday with the point of his head hitting a wooden bed frame. Now has pain in his neck  and thoracic spine where he has had compression fractures in the past. EXAM: CT THORACIC SPINE WITHOUT CONTRAST TECHNIQUE: Multidetector CT images of the thoracic were obtained using the standard protocol without intravenous contrast. RADIATION DOSE REDUCTION: This exam was performed according to the departmental dose-optimization program which includes automated exposure control, adjustment of the mA and/or kV according to patient size and/or use of iterative reconstruction technique. COMPARISON:  X-ray neck 06/27/2014, chest x-ray 08/09/2021 FINDINGS: Alignment: Exaggerated kyphotic curvature centered at the T3 level. Vertebrae: Diffusely decreased bone density. Chronic, grossly stable, T3 vertebral body fracture with a to 80% vertebral body height the height loss centrally. No acute fracture or focal pathologic process. Paraspinal and other soft tissues: Negative. Disc levels: Multilevel mild intervertebral disc space narrowing and trace vacuum phenomenon. Other: Bibasilar atelectasis of the lungs. Four-vessel coronary calcifications status post coronary artery bypass graft. Cholelithiasis. Atherosclerotic plaque of the aorta. IMPRESSION: 1. Chronic, grossly stable, T3 vertebral body fracture with a to 80% vertebral body height the height loss centrally. Correlate with point tenderness to palpation to evaluate for an acute component. 2. Otherwise no acute displaced fracture or traumatic listhesis of the thoracic spine. 3.  Aortic Atherosclerosis (ICD10-I70.0). 4. Cholelithiasis. Electronically Signed   By: Tish Frederickson M.D.   On: 10/03/2022 18:41   CT Cervical Spine Wo Contrast  Result Date: 10/03/2022 CLINICAL DATA:  Head trauma, minor (Age >= 65y) Fell yesterday with the point of his head hitting a wooden bed frame. Now has pain in his neck and thoracic spine where he has had compression fractures in the past. EXAM: CT HEAD WITHOUT CONTRAST CT CERVICAL SPINE WITHOUT CONTRAST TECHNIQUE: Multidetector CT  imaging of the head and cervical spine was performed following the standard protocol without intravenous contrast. Multiplanar CT image reconstructions of the cervical spine were also generated. RADIATION DOSE REDUCTION: This exam was performed according to the departmental dose-optimization program which includes automated exposure control, adjustment of the mA and/or kV according to patient size and/or use of iterative reconstruction technique. COMPARISON:  CT head 09/12/2022, CT head 02/15/2022, CT head 01/06/1998 report without imaging FINDINGS: CT HEAD FINDINGS Brain: Cerebral ventricle sizes are concordant with the degree of cerebral volume loss. No evidence of large-territorial acute infarction. No parenchymal hemorrhage. No mass lesion. No extra-axial collection. No mass effect or midline shift. No hydrocephalus. Basilar cisterns are patent. Vascular: No hyperdense vessel. Atherosclerotic calcifications are present within the cavernous internal carotid and vertebral arteries. Skull: No acute fracture or focal lesion. Chronic thinning of the left frontoparietal junction of the calvarium. Sinuses/Orbits: Paranasal sinuses and mastoid air cells are clear. The orbits are unremarkable. Other: None. CT CERVICAL SPINE FINDINGS Alignment: Grade 1 anterolisthesis of C7 on T1. Skull base  and vertebrae: Multilevel mild-to-moderate degenerative changes of the spine. No associated severe osseous neural foraminal or central canal stenosis. Multilevel intervertebral disc space vacuum phenomenon. No acute fracture. No aggressive appearing focal osseous lesion or focal pathologic process. Soft tissues and spinal canal: No prevertebral fluid or swelling. No visible canal hematoma. Upper chest: Stable biapical pleural/pulmonary scarring with calcification. Other: None. IMPRESSION: 1. No acute intracranial abnormality. 2. No acute displaced fracture or traumatic listhesis of the cervical spine. 3. Please see separately dictated  CT thoracic spine 10/03/2022. Electronically Signed   By: Tish Frederickson M.D.   On: 10/03/2022 18:35   CT Head Wo Contrast  Result Date: 10/03/2022 CLINICAL DATA:  Head trauma, minor (Age >= 65y) Fell yesterday with the point of his head hitting a wooden bed frame. Now has pain in his neck and thoracic spine where he has had compression fractures in the past. EXAM: CT HEAD WITHOUT CONTRAST CT CERVICAL SPINE WITHOUT CONTRAST TECHNIQUE: Multidetector CT imaging of the head and cervical spine was performed following the standard protocol without intravenous contrast. Multiplanar CT image reconstructions of the cervical spine were also generated. RADIATION DOSE REDUCTION: This exam was performed according to the departmental dose-optimization program which includes automated exposure control, adjustment of the mA and/or kV according to patient size and/or use of iterative reconstruction technique. COMPARISON:  CT head 09/12/2022, CT head 02/15/2022, CT head 01/06/1998 report without imaging FINDINGS: CT HEAD FINDINGS Brain: Cerebral ventricle sizes are concordant with the degree of cerebral volume loss. No evidence of large-territorial acute infarction. No parenchymal hemorrhage. No mass lesion. No extra-axial collection. No mass effect or midline shift. No hydrocephalus. Basilar cisterns are patent. Vascular: No hyperdense vessel. Atherosclerotic calcifications are present within the cavernous internal carotid and vertebral arteries. Skull: No acute fracture or focal lesion. Chronic thinning of the left frontoparietal junction of the calvarium. Sinuses/Orbits: Paranasal sinuses and mastoid air cells are clear. The orbits are unremarkable. Other: None. CT CERVICAL SPINE FINDINGS Alignment: Grade 1 anterolisthesis of C7 on T1. Skull base and vertebrae: Multilevel mild-to-moderate degenerative changes of the spine. No associated severe osseous neural foraminal or central canal stenosis. Multilevel intervertebral disc  space vacuum phenomenon. No acute fracture. No aggressive appearing focal osseous lesion or focal pathologic process. Soft tissues and spinal canal: No prevertebral fluid or swelling. No visible canal hematoma. Upper chest: Stable biapical pleural/pulmonary scarring with calcification. Other: None. IMPRESSION: 1. No acute intracranial abnormality. 2. No acute displaced fracture or traumatic listhesis of the cervical spine. 3. Please see separately dictated CT thoracic spine 10/03/2022. Electronically Signed   By: Tish Frederickson M.D.   On: 10/03/2022 18:35    Assessment/Plan  1. Gastroesophageal reflux disease, unspecified whether esophagitis present S/p ED visit 10/21/2022 treated with GI cocktail and IV Protonix with improvement of symptoms though still spitting up mucus and feels like choking sensation. - advised to continue to eat soft foods. - continue on Protonix and Pepcid  - Follow up with GI as scheduled  - CBC with Differential/Platelet  2. Stage 3a chronic kidney disease (HCC) CR 1.7 previous 1.3 thought due to decrease oral intake due to odynophagia and dysphagia. Bolus IV fluids was given. - will recheck BMP - Basic metabolic panel  3. Iron deficiency anemia secondary to inadequate dietary iron intake Hgb 12.7 No signs of GI bleed reported  Will recheck H/H - CBC with Differential/Platelet  Family/ staff Communication: Reviewed plan of care with patient verbalized understanding   Labs/tests ordered:  - CBC with  Differential/Platelet  Next Appointment: Return if symptoms worsen or fail to improve.   Caesar Bookman, NP

## 2022-10-23 LAB — BASIC METABOLIC PANEL
BUN/Creatinine Ratio: 25 (calc) — ABNORMAL HIGH (ref 6–22)
BUN: 34 mg/dL — ABNORMAL HIGH (ref 7–25)
CO2: 28 mmol/L (ref 20–32)
Calcium: 9.3 mg/dL (ref 8.6–10.3)
Chloride: 104 mmol/L (ref 98–110)
Creat: 1.36 mg/dL — ABNORMAL HIGH (ref 0.70–1.22)
Glucose, Bld: 89 mg/dL (ref 65–99)
Potassium: 4.4 mmol/L (ref 3.5–5.3)
Sodium: 139 mmol/L (ref 135–146)

## 2022-10-23 LAB — CBC WITH DIFFERENTIAL/PLATELET
Eosinophils Absolute: 0 cells/uL — ABNORMAL LOW (ref 15–500)
Eosinophils Relative: 0 %
HCT: 36.8 % — ABNORMAL LOW (ref 38.5–50.0)
MCHC: 34.2 g/dL (ref 32.0–36.0)
MCV: 92.9 fL (ref 80.0–100.0)
MPV: 11.4 fL (ref 7.5–12.5)
Neutro Abs: 7820 cells/uL — ABNORMAL HIGH (ref 1500–7800)
Neutrophils Relative %: 79.8 %
RDW: 11.8 % (ref 11.0–15.0)
Total Lymphocyte: 10.4 %

## 2022-10-26 ENCOUNTER — Telehealth: Payer: Self-pay | Admitting: Nurse Practitioner

## 2022-10-26 NOTE — Telephone Encounter (Signed)
Inbound call from patient stating he is having complications with acid reflux. Would like to be further advised

## 2022-10-29 ENCOUNTER — Telehealth: Payer: Self-pay | Admitting: Cardiology

## 2022-10-29 NOTE — Telephone Encounter (Signed)
Spoke with the patient who states that he just received a call from Dr. Cleophas Dunker and was advised to not take tizanidine.

## 2022-10-29 NOTE — Telephone Encounter (Signed)
Pt c/o medication issue:  1. Name of Medication: mexiletine (MEXITIL) 150 MG capsule amiodarone (PACERONE) 200 MG tablet  2. How are you currently taking this medication (dosage and times per day)?   3. Are you having a reaction (difficulty breathing--STAT)? No  4. What is your medication issue? Pharmacy calling to make provider aware that pt was prescribed Tizanidine by another provider. Please advise.

## 2022-10-29 NOTE — Telephone Encounter (Signed)
Tizanidine interacts with multiple meds he's taking.   It can cause CNS depression and so can his cyclobenzaprine, metaxalone, and Norco. Would see if he is still taking those meds or if tizanidine is replacing any of them. Signs of CNS depression include hypotension, bradycardia, drowsiness, and confusion.  Tizanidine also interacts with his amiodarone, mexiletine, carvedilol, and doxazosin. Both amiodarone and mexiletine increase concentrations of tizanidine, therefore lower dose of tizanidine should be used. Drug interaction with carvedilol and doxazosin increase the risk of hypotension and bradycardia, which can also occur due to CNS depression of this med and multiple others he's taking.

## 2022-10-29 NOTE — Telephone Encounter (Signed)
Pharmacy calling to let us know that the patient was prescribed tizanidine by Dr. Cleophas Dunker and wanted to make sure we were aware due to interaction with mexiletine and amiodarone.

## 2022-10-29 NOTE — Telephone Encounter (Signed)
Per medication list Abbey Chatters, NP has been filling his pantoprazole 40 mg po qd prescription - advise additional refills through her.  Continue Prevacid 15 mg OTC 2 po qd. If not adequate change to Nexium 20 mg OTC 2 po qd until he resumes pantoprazole. TUMS or Mylanta prn.  Follow up with PCP if symptoms persist PCP can refer to [place].

## 2022-10-29 NOTE — Telephone Encounter (Signed)
Spoke with the patient and his spouse. The patient lost his bottle of pantoprazole "about a month ago." He began having  Sore throat and reflux "about 3 or 4 weeks ago." He starting taking OTC lansoprazole 2 a day (30 mg) for his symptoms. Reports he gets temporary relief with the Mylanta. He is also takes famotidine.  He was seen at the ED and followed up with an office visit with his PCP.  I called the mail order pharmacy. Pharmacy was able to refill pantoprazole and it will arrive to him in 3-5 days.  Please advise.

## 2022-10-30 NOTE — Telephone Encounter (Signed)
Patient aware of the recommendations.  To clarify, the pantoprazole was refilled by a prescription on file. I did not give a new prescription.

## 2022-11-01 ENCOUNTER — Encounter: Payer: Self-pay | Admitting: Family

## 2022-11-01 ENCOUNTER — Other Ambulatory Visit: Payer: Self-pay | Admitting: Cardiology

## 2022-11-01 NOTE — Telephone Encounter (Signed)
Left message with pharmacy that they will need to confirm with prescribing provider's office, Dr. Reola Calkins.

## 2022-11-01 NOTE — Telephone Encounter (Signed)
Pharmacy called in, they need verbal clarification from clinical staff that pt is not taking tizanidine anymore due to drug interaction.

## 2022-11-02 ENCOUNTER — Other Ambulatory Visit: Payer: Self-pay

## 2022-11-02 ENCOUNTER — Ambulatory Visit: Payer: Medicare Other | Attending: Cardiovascular Disease

## 2022-11-02 DIAGNOSIS — I4891 Unspecified atrial fibrillation: Secondary | ICD-10-CM | POA: Diagnosis not present

## 2022-11-02 DIAGNOSIS — Z5181 Encounter for therapeutic drug level monitoring: Secondary | ICD-10-CM

## 2022-11-02 LAB — POCT INR: INR: 2.3 (ref 2.0–3.0)

## 2022-11-02 MED ORDER — MEXILETINE HCL 150 MG PO CAPS: 150.0000 mg | ORAL_CAPSULE | Freq: Two times a day (BID) | ORAL | 2 refills | Status: DC

## 2022-11-02 NOTE — Patient Instructions (Signed)
Description   Continue taking Warfarin to 1/2 tablet daily  Recheck INR in 4 weeks  [facility] [phone]

## 2022-11-06 ENCOUNTER — Telehealth: Payer: Self-pay | Admitting: *Deleted

## 2022-11-06 NOTE — Progress Notes (Signed)
  Care Coordination Note  11/06/2022 Name: Brent Griffith MRN: [id number] DOB: [birth year]  Brent Griffith is a 87 y.o. year old male who is a primary care patient of Sharon Seller, NP and is actively engaged with the care management team. I reached out to [name] by phone today to assist with re-scheduling a follow up visit with the RN Case Manager  Follow up plan: Unsuccessful telephone outreach attempt made.   [facility]  Care Coordination Care Guide  Direct Dial: [phone]

## 2022-11-09 NOTE — Progress Notes (Signed)
  Care Coordination Note  11/09/2022 Name: Brent Griffith MRN: [id number] DOB: 1936/02/15  [name] is a 87 y.o. year old male who is a primary care patient of Sharon Seller, NP and is actively engaged with the care management team. I reached out to [name] by phone today to assist with re-scheduling a follow up visit with the RN Case Manager  Follow up plan: Telephone appointment with care management team member scheduled for:11/13/22 [facility] Coordination Care Guide  Direct Dial: [phone]

## 2022-11-12 ENCOUNTER — Encounter: Payer: Self-pay | Admitting: Nurse Practitioner

## 2022-11-12 ENCOUNTER — Ambulatory Visit (INDEPENDENT_AMBULATORY_CARE_PROVIDER_SITE_OTHER): Payer: Medicare Other | Admitting: Nurse Practitioner

## 2022-11-12 VITALS — BP 104/68 | HR 73 | Temp 97.7°F | Resp 16 | Ht 62.0 in | Wt 112.0 lb

## 2022-11-12 DIAGNOSIS — Z0000 Encounter for general adult medical examination without abnormal findings: Secondary | ICD-10-CM | POA: Diagnosis not present

## 2022-11-12 NOTE — Progress Notes (Signed)
Subjective:   Brent Griffith is a 87 y.o. male who presents for Medicare Annual/Subsequent preventive examination.  Visit Complete: In person  Patient Medicare AWV questionnaire was completed by the patient on 11/12/2022; I have confirmed that all information answered by patient is correct and no changes since this date.  Review of Systems           Objective:    Today's Vitals   11/12/22 1405  BP: 104/68  Pulse: 73  Resp: 16  Temp: 97.7 F (36.5 C)  SpO2: 97%  Weight: 112 lb (50.8 kg)  Height: 5\' 2"  (1.575 m)   Body mass index is 20.49 kg/m. Wt Readings from Last 3 Encounters:  11/12/22 112 lb (50.8 kg)  10/22/22 116 lb 9.6 oz (52.9 kg)  10/21/22 111 lb (50.3 kg)        11/12/2022    2:04 PM 10/21/2022    1:43 AM 10/09/2022    9:25 PM 10/03/2022    4:55 PM 09/12/2022   12:57 PM 07/03/2022    2:29 PM 05/25/2022    2:18 PM  Advanced Directives  Does Patient Have a Medical Advance Directive? No No No No No No No  Would patient like information on creating a medical advance directive? No - Patient declined  No - Patient declined No - Patient declined  No - Patient declined No - Patient declined    Current Medications (verified) Outpatient Encounter Medications as of 11/12/2022  Medication Sig   albuterol (VENTOLIN HFA) 108 (90 Base) MCG/ACT inhaler Inhale 1-2 puffs into the lungs every 6 (six) hours as needed for wheezing or shortness of breath.   alum & mag hydroxide-simeth (MAALOX MAX) 400-400-40 MG/5ML suspension Take 10 mLs by mouth every 6 (six) hours as needed (pain with swallowing and reflux).   amiodarone (PACERONE) 200 MG tablet TAKE 1 TABLET BY MOUTH DAILY   Calcium Citrate-Vitamin D (EQ CALCIUM CITRATE+D3 PO) Take 1,200 mg by mouth daily.   carvedilol (COREG) 3.125 MG tablet Take 1 tablet (3.125 mg total) by mouth 2 (two) times daily. Please keep scheduled appointment with cardiologist   Cholecalciferol (VITAMIN D-3) 5000 units TABS Take 10,000 Units by mouth  daily.   Coenzyme Q10 (COQ10) 200 MG CAPS Take 200 mg by mouth daily.   cyclobenzaprine (FLEXERIL) 5 MG tablet Take 2 tablets (10 mg total) by mouth 2 (two) times daily as needed.   doxazosin (CARDURA) 2 MG tablet TAKE 1 TABLET BY MOUTH DAILY   empagliflozin (JARDIANCE) 10 MG TABS tablet Take 1 tablet (10 mg total) by mouth daily before breakfast.   famotidine (PEPCID) 20 MG tablet Take 1 tablet (20 mg total) by mouth 2 (two) times daily.   fexofenadine (ALLEGRA) 180 MG tablet Take 180 mg by mouth daily as needed for allergies.   finasteride (PROSCAR) 5 MG tablet TAKE 1 TABLET BY MOUTH IN  THE MORNING   furosemide (LASIX) 20 MG tablet TAKE 1 TABLET BY MOUTH DAILY AS  NEEDED FOR LEG SWELLING   HYDROcodone-acetaminophen (NORCO/VICODIN) 5-325 MG tablet Take 1 tablet by mouth every 6 (six) hours as needed for moderate pain.   Iron, Ferrous Sulfate, 325 (65 Fe) MG TABS Take 325 mg by mouth 2 (two) times daily.   losartan (COZAAR) 25 MG tablet Take 1 tablet (25 mg total) by mouth daily. Please keep scheduled appointment with cardiologist   metaxalone (SKELAXIN) 800 MG tablet Take 1 tablet (800 mg total) by mouth 3 (three) times daily.   mexiletine (  MEXITIL) 150 MG capsule Take 1 capsule (150 mg total) by mouth 2 (two) times daily.   pantoprazole (PROTONIX) 40 MG tablet TAKE 1 TABLET BY MOUTH DAILY   rosuvastatin (CRESTOR) 20 MG tablet TAKE 1 TABLET BY MOUTH ONCE  DAILY FOR CHOLESTEROL   warfarin (COUMADIN) 2.5 MG tablet TAKE 1/2 TO 1 TABLET BY MOUTH  DAILY AS DIRECTED BY THE  COUMADIN CLINIC   No facility-administered encounter medications on file as of 11/12/2022.    Allergies (verified) Compazine [prochlorperazine edisylate]   History: Past Medical History:  Diagnosis Date   Acute gastric ulcer without mention of hemorrhage, perforation, or obstruction    Allergic rhinitis, cause unspecified    Anemia, unspecified    Arthritis    Asthma    Blood transfusion without reported diagnosis     CAD (coronary artery disease)    Diaphragmatic hernia without mention of obstruction or gangrene    Elevated prostate specific antigen (PSA)    Enlarged prostate    Esophageal reflux    Extrinsic asthma, unspecified    Herpes zoster without mention of complication    Hyperlipidemia    Hypersomnia with sleep apnea, unspecified    Impotence of organic origin    Kyphosis    Lumbago    Obstructive sleep apnea (adult) (pediatric)    Pneumonia    hx of years ago    Senile osteoporosis    Trigger finger (acquired)    Tubular adenoma of colon 05/2013   Unspecified essential hypertension    Unspecified sleep apnea    CPAP- settings 4-12    Unspecified vitamin D deficiency    Past Surgical History:  Procedure Laterality Date   CARDIAC CATHETERIZATION  03/04/2009   Dr Dorris Fetch   COLONOSCOPY     CORONARY ARTERY BYPASS GRAFT  2010   CYSTOSCOPY WITH RETROGRADE PYELOGRAM, URETEROSCOPY AND STENT PLACEMENT Right 10/04/2013   Procedure: CYSTOSCOPY WITH RETROGRADE PYELOGRAM,  AND STENT PLACEMENT;  Surgeon: Jerilee Field, MD;  Location: [facility];  Service: Urology;  Laterality: Right;   DENTAL SURGERY N/A    MOLE REMOVAL  1958   chin   ROTATOR CUFF REPAIR Left 04/1998   with bone spur removed, Dr Madelon Lips   TONSILLECTOMY  1945   TRANSURETHRAL RESECTION OF PROSTATE N/A 09/24/2013   Procedure: TRANSURETHRAL RESECTION OF THE PROSTATE (TURP) WITH GYRUS (STAGED RIGHT LATERAL AND MEDIAN LOBE);  Surgeon: Kathi Ludwig, MD;  Location: [facility];  Service: Urology;  Laterality: N/A;   UPPER GI ENDOSCOPY     Family History  Problem Relation Age of Onset   Stroke Mother    Breast cancer Mother    Cirrhosis Mother        wine   Rheumatic fever Mother    Kyphosis Mother    Hypertension Father    Stroke Father    Heart disease Father    Heart disease Paternal Grandfather    Colon cancer Neg Hx    Social History   Socioeconomic History   Marital status: Married    Spouse name: Not on  file   Number of children: 0   Years of education: Not on file   Highest education level: Not on file  Occupational History   Occupation: Network engineer  Tobacco Use   Smoking status: Never    Passive exposure: Never   Smokeless tobacco: Never  Vaping Use   Vaping Use: Never used  Substance and Sexual Activity   Alcohol use: No   Drug  use: No   Sexual activity: Not Currently  Other Topics Concern   Not on file  Social History Narrative   Not on file   Social Determinants of Health   Financial Resource Strain: Not on file  Food Insecurity: Food Insecurity Present (04/03/2022)   Hunger Vital Sign    Worried About Running Out of Food in the Last Year: Sometimes true    Ran Out of Food in the Last Year: Sometimes true  Transportation Needs: No Transportation Needs (04/13/2022)   PRAPARE - Administrator, Civil Service (Medical): No    Lack of Transportation (Non-Medical): No  Physical Activity: Not on file  Stress: Not on file  Social Connections: Not on file    Tobacco Counseling Counseling given: Not Answered   Clinical Intake:                        Activities of Daily Living     No data to display          Patient Care Team: Sharon Seller, NP as PCP - General (Geriatric Medicine) Lewayne Bunting, MD as PCP - Cardiology (Cardiology) Lanier Prude, MD as PCP - Electrophysiology (Cardiology) Melina Fiddler, MD as Consulting Physician (Sports Medicine) Ernesto Rutherford, MD as Referring Physician (Ophthalmology)  Indicate any recent Medical Services you may have received from other than [facility] providers in the past year (date may be approximate).     Assessment:   This is a routine wellness examination for Brent Griffith.  Hearing/Vision screen No results found.  Dietary issues and exercise activities discussed:     Goals Addressed   None    Depression Screen    11/12/2022    2:16 PM 03/16/2022    2:34 PM  11/09/2021    8:56 AM 05/05/2021    2:04 PM 11/03/2020    2:17 PM 10/30/2018    1:19 PM 10/21/2018    3:17 PM  PHQ 2/9 Scores  PHQ - 2 Score 0 0 0 0 0 0 0  Exception Documentation Other- indicate reason in comment box        Not completed AWV          Fall Risk    11/12/2022    2:04 PM 10/22/2022    2:56 PM 07/03/2022    2:29 PM 05/25/2022    2:18 PM 05/21/2022    2:54 PM  Fall Risk   Falls in the past year? 1 1 0 0 0  Number falls in past yr: 1 1 0 0 0  Injury with Fall? 0 0 0 0 0  Risk for fall due to : History of fall(s);Impaired balance/gait;Impaired mobility History of fall(s) No Fall Risks No Fall Risks No Fall Risks  Follow up Falls evaluation completed Falls evaluation completed Falls evaluation completed Falls evaluation completed Falls evaluation completed    MEDICARE RISK AT HOME:   TIMED UP AND GO:  Was the test performed?  No    Cognitive Function:        11/09/2021    2:15 PM 11/03/2020    2:20 PM 10/30/2018    1:20 PM  6CIT Screen  What Year? 0 points 0 points 0 points  What month? 0 points 0 points 0 points  What time? 0 points 0 points 0 points  Count back from 20 0 points 0 points 0 points  Months in reverse 2 points 0 points 0 points  Repeat phrase  2 points 4 points 0 points  Total Score 4 points 4 points 0 points    Immunizations Immunization History  Administered Date(s) Administered   Fluad Quad(high Dose 65+) 01/25/2020, 02/20/2021, 03/16/2022   Influenza Split 01/30/2012, 02/10/2014   Influenza Whole 02/17/2009, 02/18/2013   Influenza, High Dose Seasonal PF 03/19/2018, 02/02/2019   Influenza, Quadrivalent, Recombinant, Inj, Pf 02/23/2017   Influenza,inj,Quad PF,6+ Mos 02/23/2017   Influenza-Unspecified 02/08/2014, 02/24/2015, 03/16/2016   Moderna Sars-Covid-2 Vaccination 07/21/2019, 08/18/2019, 05/03/2020, 09/28/2020, 01/11/2021   Pneumococcal Conjugate-13 04/09/2013   Pneumococcal Polysaccharide-23 10/18/2004   Td 09/29/1990   Tdap  07/25/2011   Zoster Recombinant(Shingrix) 10/09/2016, 04/06/2021, 07/04/2021    TDAP status: Due, Education has been provided regarding the importance of this vaccine. Advised may receive this vaccine at local pharmacy or Health Dept. Aware to provide a copy of the vaccination record if obtained from local pharmacy or Health Dept. Verbalized acceptance and understanding.  Flu Vaccine status: Up to date  Pneumococcal vaccine status: Up to date  Covid-19 vaccine status: Information provided on how to obtain vaccines.   Qualifies for Shingles Vaccine? Yes   Zostavax completed No   Shingrix Completed?: Yes  Screening Tests Health Maintenance  Topic Date Due   DTaP/Tdap/Td (3 - Td or Tdap) 07/24/2021   COVID-19 Vaccine (6 - 2023-24 season) 01/05/2022   Medicare Annual Wellness (AWV)  11/10/2022   INFLUENZA VACCINE  12/06/2022   Pneumonia Vaccine 80+ Years old  Completed   Zoster Vaccines- Shingrix  Completed   HPV VACCINES  Aged Out   Colonoscopy  Discontinued    Health Maintenance  Health Maintenance Due  Topic Date Due   DTaP/Tdap/Td (3 - Td or Tdap) 07/24/2021   COVID-19 Vaccine (6 - 2023-24 season) 01/05/2022   Medicare Annual Wellness (AWV)  11/10/2022    Colorectal cancer screening: No longer required.   Lung Cancer Screening: (Low Dose CT Chest recommended if Age 62-80 years, 20 pack-year currently smoking OR have quit w/in 15years.) does not qualify.   Lung Cancer Screening Referral: na  Additional Screening:  Hepatitis C Screening: does not qualify; Completed   Vision Screening: Recommended annual ophthalmology exams for early detection of glaucoma and other disorders of the eye. Is the patient up to date with their annual eye exam?  Yes  Who is the provider or what is the name of the office in which the patient attends annual eye exams? [facility] If pt is not established with a provider, would they like to be referred to a provider to establish care? No .    Dental Screening: Recommended annual dental exams for proper oral hygiene  Community Resource Referral / Chronic Care Management: CRR required this visit?  No   CCM required this visit?  No     Plan:     I have personally reviewed and noted the following in the patient's chart:   Medical and social history Use of alcohol, tobacco or illicit drugs  Current medications and supplements including opioid prescriptions. Patient is not currently taking opioid prescriptions. Functional ability and status Nutritional status Physical activity Advanced directives List of other physicians Hospitalizations, surgeries, and ER visits in previous 12 months Vitals Screenings to include cognitive, depression, and falls Referrals and appointments  In addition, I have reviewed and discussed with patient certain preventive protocols, quality metrics, and best practice recommendations. A written personalized care plan for preventive services as well as general preventive health recommendations were provided to patient.     Janene Harvey  Janyth Contes, NP   11/12/2022   Place of service: [facility]

## 2022-11-12 NOTE — Patient Instructions (Signed)
Brent Griffith , Thank you for taking time to come for your Medicare Wellness Visit. I appreciate your ongoing commitment to your health goals. Please review the following plan we discussed and let me know if I can assist you in the future.   These are the goals we discussed:  Goals       Exercise 3x per week (30 min per time)      I will like to exercise at least 2-3 times per week at the [facility] or [employer]       I would like to participate in the [facility] (pt-stated)      Care Coordination Interventions: Evaluation of current treatment plan related to impaired physical mobility and patient's adherence to plan as established by provider Determined patient missed the nurse call from [facility] regarding the PREP program Determined patient plans to complete outpatient PT before starting the PREP program Provided patient with the contact information for nurse Pam and encouraged him to call Pam to discuss the next scheduled PREP class and his start date      To have no more falls      Care Coordination Interventions: Provided written and verbal education re: potential causes of falls and Fall prevention strategies Advised patient of importance of notifying provider of falls Assessed for falls since last encounter Assessed patients knowledge of fall risk prevention secondary to previously provided education Reviewed with patient PCP recommendations for outpatient PT, determined patient plans to resume services with his previous therapist, Misty Stanley with [facility], he will contact Misty Stanley to set up therapy Assessed for DME needs, patient declines needing a cane or walker for ambulation at this time          To maintain muscle mass by increasing protein      Care Coordination Interventions: Evaluation of current treatment plan related to Nutritional Imbalance and patient's adherence to plan as established by provider Discussed with patient he has lost weight due to taking in less  protein with meals on a regular basis Determined patient is interested in Meals on Wheels but unfortunately he is not eligible due to he continues to drive himself to his appointments Assessed for food insecurity, patient reports using local food banks and has several friends who provide him and his spouse with healthy foods Determined patient prepares the meals in the home and denies having difficulty in doing so Discussed with patient the benefits of supplementing and or adding high protein drinks to his meal regimen such as Ensure and or Boost Mailed printed coupons to patient to help with cost of Ensure Instructed patient to keep his doctor well informed of persistent weight loss Instructed patient to let this RN know if he stops driving and would like to sign up for Meals on Wheels and patient is agreeable  Body mass index is 23.16 kg/m.    Wt Readings from Last 3 Encounters:  09/14/22 126 lb 9.6 oz (57.4 kg)  09/12/22 122 lb (55.3 kg)  08/22/22 127 lb 12.8 oz (58 kg)              This is a list of the screening recommended for you and due dates:  Health Maintenance  Topic Date Due   DTaP/Tdap/Td vaccine (3 - Td or Tdap) 07/24/2021   COVID-19 Vaccine (6 - 2023-24 season) 01/05/2022   Flu Shot  12/06/2022   Medicare Annual Wellness Visit  11/12/2023   Pneumonia Vaccine  Completed   Zoster (Shingles) Vaccine  Completed   HPV Vaccine  Aged Out   Colon Cancer Screening  Discontinued

## 2022-11-13 ENCOUNTER — Encounter: Payer: Self-pay | Admitting: Family

## 2022-11-13 ENCOUNTER — Ambulatory Visit: Payer: Self-pay

## 2022-11-13 DIAGNOSIS — I4891 Unspecified atrial fibrillation: Secondary | ICD-10-CM

## 2022-11-13 DIAGNOSIS — I25709 Atherosclerosis of coronary artery bypass graft(s), unspecified, with unspecified angina pectoris: Secondary | ICD-10-CM

## 2022-11-13 DIAGNOSIS — I10 Essential (primary) hypertension: Secondary | ICD-10-CM

## 2022-11-14 DIAGNOSIS — H2513 Age-related nuclear cataract, bilateral: Secondary | ICD-10-CM | POA: Diagnosis not present

## 2022-11-14 DIAGNOSIS — H40013 Open angle with borderline findings, low risk, bilateral: Secondary | ICD-10-CM | POA: Diagnosis not present

## 2022-11-14 DIAGNOSIS — D3132 Benign neoplasm of left choroid: Secondary | ICD-10-CM | POA: Diagnosis not present

## 2022-11-14 DIAGNOSIS — H04123 Dry eye syndrome of bilateral lacrimal glands: Secondary | ICD-10-CM | POA: Diagnosis not present

## 2022-11-14 DIAGNOSIS — H10413 Chronic giant papillary conjunctivitis, bilateral: Secondary | ICD-10-CM | POA: Diagnosis not present

## 2022-11-14 NOTE — Patient Outreach (Signed)
Care Coordination   Follow Up Visit Note   11/13/2022 Name: Brent Griffith MRN: [id number] DOB: 1935-12-20  Brent Griffith Route is a 87 y.o. year old male who sees Eubanks, Janene Harvey, NP for primary care. I spoke with  Brent Griffith by phone today.  What matters to the patients health and wellness today?  Patient would like to increase his caloric intake. He would like to try some chair exercies and light weight lifting to help build muscle strength and endurance.     Goals Addressed               This Visit's Progress     Patient Stated     I would like to participate in the [facility] exercise program (pt-stated)        Care Coordination Interventions: Evaluation of current treatment plan related to impaired physical mobility and patient's adherence to plan as established by provider Determined patient experienced a fall in his yard at home while trying to remove a tree from his front yard that had fallen Determined patient was ordered to wear a neck and back brace for which he will pick up tomorrow on 11/14/22 Encouraged patient to ask for help from family or his church with difficult tasks that may result in a fall or injury  Determined patient will not participate in the PREP program at this time, he is agreeable to trying some chair exercises as tolerated Mailed printed educational materials related to getting a full body workout with chair exercises  Sent secure email alerting the PREP team, patient will not participate in this program       Other     To have no more falls        Care Coordination Interventions: Provided written and verbal education re: potential causes of falls and Fall prevention strategies Advised patient of importance of notifying provider of falls Assessed for falls since last encounter Assessed patients knowledge of fall risk prevention secondary to previously provided education (See other goal regarding recent fall)        To maintain muscle mass by increasing  protein        Care Coordination Interventions: Evaluation of current treatment plan related to Nutritional Imbalance and patient's adherence to plan as established by provider Reviewed and discussed with patient and wife Brent Griffith, concerns over patient's decreased appetite, he is often skipping meals due to lack of appetite Educated patient on importance of adequate caloric intake to help preserve muscle mass and organ function  Educated wife and patient regarding food choices high in protein to implement, encouraged to eat 5-6 small meals per day making healthy, high protein choices Educated patient about adequate water intake, aim for 48-64 oz of water daily  Mailed printed educational materials related to Nutrition for the Elderly and Ensure coupons Instructed patient to supplement with Ensure on days he is not eating meals or healthy snacks  Educated patient regarding prescription appetite stimulants if approved by PCP and patient would like to try this type of treatment  Sent in basket message to Brent Chatters NP requesting recommendations for a prescription appetite stimulant  Body mass index is 20.49 kg/m.    Wt Readings from Last 3 Encounters:  11/12/22 112 lb (50.8 kg)  10/22/22 116 lb 9.6 oz (52.9 kg)  10/21/22 111 lb (50.3 kg)  Body mass index is 23.16 kg/m.    Wt Readings from Last 3 Encounters:  09/14/22 126 lb 9.6 oz (57.4 kg)  09/12/22  122 lb (55.3 kg)  08/22/22 127 lb 12.8 oz (58 kg)       Interventions Today    Flowsheet Row Most Recent Value  Chronic Disease   Chronic disease during today's visit Other  [Esophagitis,  chronic midline back pain,  anorexia]  General Interventions   General Interventions Discussed/Reviewed General Interventions Discussed, General Interventions Reviewed, Labs, Doctor Visits, Communication with  Doctor Visits Discussed/Reviewed Doctor Visits Discussed, Doctor Visits Reviewed, Specialist, PCP  Communication with PCP/Specialists   Brent Chatters NP]  Exercise Interventions   Exercise Discussed/Reviewed Exercise Discussed, Exercise Reviewed, Physical Activity, Assistive device use and maintanence  Physical Activity Discussed/Reviewed Physical Activity Reviewed, Physical Activity Discussed, Types of exercise, PREP  Education Interventions   Education Provided Provided Education, Provided Printed Education  Provided Verbal Education On Labs, Nutrition, When to see the doctor, Medication, Exercise  Nutrition Interventions   Nutrition Discussed/Reviewed Nutrition Discussed, Nutrition Reviewed, Increasing proteins, Fluid intake  Pharmacy Interventions   Pharmacy Dicussed/Reviewed Pharmacy Topics Discussed, Pharmacy Topics Reviewed, Medications and their functions  Safety Interventions   Safety Discussed/Reviewed Safety Discussed, Safety Reviewed, Fall Risk, Home Safety  Home Safety Assistive Devices          SDOH assessments and interventions completed:  No     Care Coordination Interventions:  Yes, provided   Follow up plan: Follow up call scheduled for 12/17/22 2:00 PM    Encounter Outcome:  Pt. Visit Completed

## 2022-11-14 NOTE — Patient Instructions (Signed)
Visit Information  Thank you for taking time to visit with me today. Please don't hesitate to contact me if I can be of assistance to you.   Following are the goals we discussed today:   Goals Addressed               This Visit's Progress     Patient Stated     I would like to participate in the Y exercise program (pt-stated)        Care Coordination Interventions: Evaluation of current treatment plan related to impaired physical mobility and patient's adherence to plan as established by provider Determined patient experienced a fall in his yard at home while trying to remove a tree from his front yard that had fallen Determined patient was ordered to wear a neck and back brace for which he will pick up tomorrow on 11/14/22 Encouraged patient to ask for help from family or his church with difficult tasks that may result in a fall or injury  Determined patient will not participate in the PREP program at this time, he is agreeable to trying some chair exercises as tolerated Mailed printed educational materials related to getting a full body workout with chair exercises  Sent secure email alerting the PREP team, patient will not participate in this program       Other     To have no more falls        Care Coordination Interventions: Provided written and verbal education re: potential causes of falls and Fall prevention strategies Advised patient of importance of notifying provider of falls Assessed for falls since last encounter Assessed patients knowledge of fall risk prevention secondary to previously provided education (See other goal regarding recent fall)        To maintain muscle mass by increasing protein        Care Coordination Interventions: Evaluation of current treatment plan related to Nutritional Imbalance and patient's adherence to plan as established by provider Reviewed and discussed with patient and wife Margaretha Glassing, concerns over patient's decreased appetite, he is  often skipping meals due to lack of appetite Educated patient on importance of adequate caloric intake to help preserve muscle mass and organ function  Educated wife and patient regarding food choices high in protein to implement, encouraged to eat 5-6 small meals per day making healthy, high protein choices Educated patient about adequate water intake, aim for 48-64 oz of water daily  Mailed printed educational materials related to Nutrition for the Elderly and Ensure coupons Instructed patient to supplement with Ensure on days he is not eating meals or healthy snacks  Educated patient regarding prescription appetite stimulants if approved by PCP and patient would like to try this type of treatment  Sent in basket message to [name] requesting recommendations for a prescription appetite stimulant  Body mass index is 20.49 kg/m.    Wt Readings from Last 3 Encounters:  11/12/22 112 lb (50.8 kg)  10/22/22 116 lb 9.6 oz (52.9 kg)  10/21/22 111 lb (50.3 kg)  Body mass index is 23.16 kg/m.    Wt Readings from Last 3 Encounters:  09/14/22 126 lb 9.6 oz (57.4 kg)  09/12/22 122 lb (55.3 kg)  08/22/22 127 lb 12.8 oz (58 kg)              Our next appointment is by telephone on 12/17/22 at 2:00 PM  Please call the care guide team at [phone] if you need to cancel or reschedule your  appointment.   If you are experiencing a Mental Health or Behavioral Health Crisis or need someone to talk to, please call 1-800-273-TALK (toll free, 24 hour hotline)  Patient verbalizes understanding of instructions and care plan provided today and agrees to view in MyChart. Active MyChart status and patient understanding of how to access instructions and care plan via MyChart confirmed with patient.     Delsa Sale, RN, BSN, CCM Care Management Coordinator [facility] Care Management  Direct Phone: [phone]

## 2022-11-15 ENCOUNTER — Encounter: Payer: Self-pay | Admitting: Family

## 2022-11-15 ENCOUNTER — Telehealth: Payer: Self-pay

## 2022-11-15 NOTE — Telephone Encounter (Signed)
Patient called inquiring about a medication that was suppose to be sent for swallowing and sore throat. He would like medication sent to [facility].   Message sent to Abbey Chatters, NP

## 2022-11-15 NOTE — Addendum Note (Signed)
Addended by: Riley Churches on: 11/15/2022 12:37 PM   Modules accepted: Orders

## 2022-11-15 NOTE — Telephone Encounter (Signed)
Patient is calling states his throat is sore and it hurts to swallow, looking for recommendations to help him until his appt. Please advise

## 2022-11-16 ENCOUNTER — Encounter: Payer: Self-pay | Admitting: Adult Health

## 2022-11-16 ENCOUNTER — Telehealth: Payer: Self-pay | Admitting: *Deleted

## 2022-11-16 ENCOUNTER — Ambulatory Visit (INDEPENDENT_AMBULATORY_CARE_PROVIDER_SITE_OTHER): Payer: Medicare Other | Admitting: Adult Health

## 2022-11-16 VITALS — BP 122/88 | HR 61 | Temp 97.1°F | Resp 18 | Ht 62.0 in | Wt 111.2 lb

## 2022-11-16 DIAGNOSIS — I10 Essential (primary) hypertension: Secondary | ICD-10-CM | POA: Diagnosis not present

## 2022-11-16 DIAGNOSIS — J029 Acute pharyngitis, unspecified: Secondary | ICD-10-CM

## 2022-11-16 DIAGNOSIS — I4891 Unspecified atrial fibrillation: Secondary | ICD-10-CM

## 2022-11-16 DIAGNOSIS — R630 Anorexia: Secondary | ICD-10-CM | POA: Diagnosis not present

## 2022-11-16 LAB — POC COVID19 BINAXNOW: SARS Coronavirus 2 Ag: NEGATIVE

## 2022-11-16 LAB — POCT RAPID STREP A (OFFICE): Rapid Strep A Screen: NEGATIVE

## 2022-11-16 MED ORDER — MIRTAZAPINE 7.5 MG PO TABS: 7.5000 mg | ORAL_TABLET | Freq: Every day | ORAL | 3 refills | Status: DC

## 2022-11-16 NOTE — Telephone Encounter (Signed)
   Telephone encounter was:  Unsuccessful.  11/16/2022 Name: Brent Griffith MRN: [id number] DOB: [birth year]  Unsuccessful outbound call made today to assist with:  Food Insecurity  Outreach Attempt:  1st Attempt  A HIPAA compliant voice message was left requesting a return call.  Instructed patient to call back at [phone].  [name] -[facility] [place], Population Health [phone] [address] [place] , [place] [address] Email : [email or web address]

## 2022-11-16 NOTE — Telephone Encounter (Signed)
I tried reaching patient, but no answer. Message was left for patient to give the office a call.

## 2022-11-16 NOTE — Telephone Encounter (Signed)
Unsure what medication he is referring to. Please have him make appt.  He was last seen in June for acute

## 2022-11-16 NOTE — Telephone Encounter (Signed)
Patient returned call and appointment was made.

## 2022-11-16 NOTE — Progress Notes (Unsigned)
[facility] clinic  Provider:  Kenard Gower DNP  Goals of Care:     11/16/2022    3:24 PM  Advanced Directives  Does Patient Have a Medical Advance Directive? No  Would patient like information on creating a medical advance directive? No - Patient declined     Chief Complaint  Patient presents with   Acute Visit    Sore throat and trouble swallowing     HPI: Patient is a 87 y.o. male seen today for an acute visit for sore throat and trouble swallowing. He was accompanied today by his wife of 39 years. He has been having sore throat X 4 days. He denies fever nor chills. He said that he has difficulty swallowing. Wife stated that he is able to swallow Jello. He had weight loss of 5 lbs in a month. He stated that he has poor appetite, as well. He denies coughing. They get food supplementation from church or the food banks. Since wife stated that they have enough food in the house.  Rapid sterp A and COVID-19 test negative  With wife , married for 39 years  BP 122/88 Sore throat 4 days,no fever/chills  recommended warm water and salt   Difficulty swallowing with all food consistency except jello -  has acid reflux, not coughing -they go to food banks, not enough food in the house  Negative for COVID-19 and rapid strep A -  given 4 Ensures For PT eval outpatient  [facility]   Wt Readings from Last 3 Encounters:  11/16/22 111 lb 3.2 oz (50.4 kg)  11/12/22 112 lb (50.8 kg)  10/22/22 116 lb 9.6 oz (52.9 kg)     Past Medical History:  Diagnosis Date   Acute gastric ulcer without mention of hemorrhage, perforation, or obstruction    Allergic rhinitis, cause unspecified    Anemia, unspecified    Arthritis    Asthma    Blood transfusion without reported diagnosis    CAD (coronary artery disease)    Diaphragmatic hernia without mention of obstruction or gangrene    Elevated prostate specific antigen (PSA)    Enlarged prostate    Esophageal reflux     Extrinsic asthma, unspecified    Herpes zoster without mention of complication    Hyperlipidemia    Hypersomnia with sleep apnea, unspecified    Impotence of organic origin    Kyphosis    Lumbago    Obstructive sleep apnea (adult) (pediatric)    Pneumonia    hx of years ago    Senile osteoporosis    Trigger finger (acquired)    Tubular adenoma of colon 05/2013   Unspecified essential hypertension    Unspecified sleep apnea    CPAP- settings 4-12    Unspecified vitamin D deficiency     Past Surgical History:  Procedure Laterality Date   CARDIAC CATHETERIZATION  03/04/2009   Dr Dorris Fetch   COLONOSCOPY     CORONARY ARTERY BYPASS GRAFT  2010   CYSTOSCOPY WITH RETROGRADE PYELOGRAM, URETEROSCOPY AND STENT PLACEMENT Right 10/04/2013   Procedure: CYSTOSCOPY WITH RETROGRADE PYELOGRAM,  AND STENT PLACEMENT;  Surgeon: Jerilee Field, MD;  Location: [facility];  Service: Urology;  Laterality: Right;   DENTAL SURGERY N/A    MOLE REMOVAL  1958   chin   ROTATOR CUFF REPAIR Left 04/1998   with bone spur removed, Dr Madelon Lips   TONSILLECTOMY  1945   TRANSURETHRAL RESECTION OF PROSTATE N/A 09/24/2013   Procedure: TRANSURETHRAL RESECTION OF THE PROSTATE (  TURP) WITH GYRUS (STAGED RIGHT LATERAL AND MEDIAN LOBE);  Surgeon: Kathi Ludwig, MD;  Location: [facility];  Service: Urology;  Laterality: N/A;   UPPER GI ENDOSCOPY      Allergies  Allergen Reactions   Compazine [Prochlorperazine Edisylate] Anxiety    Outpatient Encounter Medications as of 11/16/2022  Medication Sig   albuterol (VENTOLIN HFA) 108 (90 Base) MCG/ACT inhaler Inhale 1-2 puffs into the lungs every 6 (six) hours as needed for wheezing or shortness of breath.   alum & mag hydroxide-simeth (MAALOX MAX) 400-400-40 MG/5ML suspension Take 10 mLs by mouth every 6 (six) hours as needed (pain with swallowing and reflux).   amiodarone (PACERONE) 200 MG tablet TAKE 1 TABLET BY MOUTH DAILY   Calcium Citrate-Vitamin D (EQ CALCIUM  CITRATE+D3 PO) Take 1,200 mg by mouth daily.   carvedilol (COREG) 3.125 MG tablet Take 1 tablet (3.125 mg total) by mouth 2 (two) times daily. Please keep scheduled appointment with cardiologist   Cholecalciferol (VITAMIN D-3) 5000 units TABS Take 10,000 Units by mouth daily.   Coenzyme Q10 (COQ10) 200 MG CAPS Take 200 mg by mouth daily.   cyclobenzaprine (FLEXERIL) 5 MG tablet Take 2 tablets (10 mg total) by mouth 2 (two) times daily as needed.   doxazosin (CARDURA) 2 MG tablet TAKE 1 TABLET BY MOUTH DAILY   empagliflozin (JARDIANCE) 10 MG TABS tablet Take 1 tablet (10 mg total) by mouth daily before breakfast.   famotidine (PEPCID) 20 MG tablet Take 1 tablet (20 mg total) by mouth 2 (two) times daily.   fexofenadine (ALLEGRA) 180 MG tablet Take 180 mg by mouth daily as needed for allergies.   finasteride (PROSCAR) 5 MG tablet TAKE 1 TABLET BY MOUTH IN  THE MORNING   furosemide (LASIX) 20 MG tablet TAKE 1 TABLET BY MOUTH DAILY AS  NEEDED FOR LEG SWELLING   HYDROcodone-acetaminophen (NORCO/VICODIN) 5-325 MG tablet Take 1 tablet by mouth every 6 (six) hours as needed for moderate pain.   Iron, Ferrous Sulfate, 325 (65 Fe) MG TABS Take 325 mg by mouth 2 (two) times daily.   losartan (COZAAR) 25 MG tablet Take 1 tablet (25 mg total) by mouth daily. Please keep scheduled appointment with cardiologist   metaxalone (SKELAXIN) 800 MG tablet Take 1 tablet (800 mg total) by mouth 3 (three) times daily.   mexiletine (MEXITIL) 150 MG capsule Take 1 capsule (150 mg total) by mouth 2 (two) times daily.   pantoprazole (PROTONIX) 40 MG tablet TAKE 1 TABLET BY MOUTH DAILY   rosuvastatin (CRESTOR) 20 MG tablet TAKE 1 TABLET BY MOUTH ONCE  DAILY FOR CHOLESTEROL   warfarin (COUMADIN) 2.5 MG tablet TAKE 1/2 TO 1 TABLET BY MOUTH  DAILY AS DIRECTED BY THE  COUMADIN CLINIC   No facility-administered encounter medications on file as of 11/16/2022.    Review of Systems:  Review of Systems  Constitutional:  Positive  for appetite change. Negative for activity change and fever.       Poor appetite  HENT:  Positive for sore throat.   Eyes: Negative.   Cardiovascular:  Negative for chest pain and leg swelling.  Gastrointestinal:  Negative for abdominal distention, diarrhea and vomiting.  Genitourinary:  Negative for dysuria, frequency and urgency.  Skin:  Negative for color change.  Neurological:  Negative for dizziness and headaches.  Psychiatric/Behavioral:  Negative for behavioral problems and sleep disturbance. The patient is not nervous/anxious.     Health Maintenance  Topic Date Due   DTaP/Tdap/Td (3 -  Td or Tdap) 07/24/2021   COVID-19 Vaccine (6 - 2023-24 season) 01/05/2022   INFLUENZA VACCINE  12/06/2022   Medicare Annual Wellness (AWV)  11/12/2023   Pneumonia Vaccine 20+ Years old  Completed   Zoster Vaccines- Shingrix  Completed   HPV VACCINES  Aged Out   Colonoscopy  Discontinued    Physical Exam: Vitals:   11/16/22 1525  BP: 122/88  Pulse: 61  Resp: 18  Temp: (!) 97.1 F (36.2 C)  SpO2: 97%  Weight: 111 lb 3.2 oz (50.4 kg)  Height: 5\' 2"  (1.575 m)   Body mass index is 20.34 kg/m. Physical Exam Constitutional:      Appearance: Normal appearance.  HENT:     Head: Normocephalic and atraumatic.     Mouth/Throat:     Mouth: Mucous membranes are moist.  Eyes:     Conjunctiva/sclera: Conjunctivae normal.  Cardiovascular:     Rate and Rhythm: Normal rate and regular rhythm.     Pulses: Normal pulses.     Heart sounds: Normal heart sounds.  Pulmonary:     Effort: Pulmonary effort is normal.     Breath sounds: Normal breath sounds.  Abdominal:     General: Bowel sounds are normal.     Palpations: Abdomen is soft.  Musculoskeletal:        General: No swelling. Normal range of motion.     Cervical back: Normal range of motion.  Skin:    General: Skin is warm and dry.  Neurological:     General: No focal deficit present.     Mental Status: He is alert and oriented to  person, place, and time.  Psychiatric:        Mood and Affect: Mood normal.        Behavior: Behavior normal.        Thought Content: Thought content normal.        Judgment: Judgment normal.     Labs reviewed: Basic Metabolic Panel: Recent Labs    08/31/22 1515 10/21/22 0149 10/22/22 1557  NA 138 137 139  K 3.9 4.3 4.4  CL 103 105 104  CO2 23 23 28   GLUCOSE 128* 111* 89  BUN 25 30* 34*  CREATININE 1.16 1.70* 1.36*  CALCIUM 8.8 9.5 9.3  TSH 1.200  --   --    Liver Function Tests: Recent Labs    03/16/22 1455 08/31/22 1515  AST 29 27  ALT 30 23  ALKPHOS  --  59  BILITOT 0.9 1.0  PROT 6.1 5.8*  ALBUMIN  --  4.0   No results for input(s): "LIPASE", "AMYLASE" in the last 8760 hours. No results for input(s): "AMMONIA" in the last 8760 hours. CBC: Recent Labs    09/14/22 1516 10/21/22 0149 10/22/22 1557  WBC 4.9 6.2 9.8  NEUTROABS 3,548 3.9 7,820*  HGB 12.2* 12.7* 12.6*  HCT 36.0* 37.9* 36.8*  MCV 94.7 92.0 92.9  PLT 161 217 235   Lipid Panel: Recent Labs    03/16/22 1455  CHOL 122  HDL 62  LDLCALC 46  TRIG 65  CHOLHDL 2.0   Lab Results  Component Value Date   HGBA1C 5.6 07/28/2012    Procedures since last visit: DG Neck Soft Tissue  Result Date: 10/21/2022 CLINICAL DATA:  Sore throat and cough EXAM: NECK SOFT TISSUES - 1+ VIEW COMPARISON:  None Available. FINDINGS: No airway obstructive changes are seen. The epiglottis appears within normal limits. No prevertebral soft tissue swelling is noted. No bony  abnormality is seen. IMPRESSION: No acute abnormality noted. Electronically Signed   By: Alcide Clever M.D.   On: 10/21/2022 02:24   DG Chest 2 View  Result Date: 10/21/2022 CLINICAL DATA:  Sore throat and cough EXAM: CHEST - 2 VIEW COMPARISON:  08/09/2021 FINDINGS: Cardiac shadow is stable. Postsurgical changes are again seen. The lungs are well aerated bilaterally. No focal infiltrate or effusion is seen. No acute bony abnormality is noted.  IMPRESSION: No active cardiopulmonary disease. Electronically Signed   By: Alcide Clever M.D.   On: 10/21/2022 02:24    Assessment/Plan       Labs/tests ordered:  * No order type specified * Next appt:  03/18/2023

## 2022-11-19 ENCOUNTER — Telehealth: Payer: Self-pay | Admitting: *Deleted

## 2022-11-19 NOTE — Telephone Encounter (Signed)
   Telephone encounter was:  Unsuccessful.  11/19/2022 Name: Brent Griffith MRN: [id number] DOB: 1935-07-22  Unsuccessful outbound call made today to assist with:  Food Insecurity  Outreach Attempt:  1st Attempt  A HIPAA compliant voice message was left requesting a return call.  Instructed patient to call back at [phone].  [name] -[facility] [place], Population Health [phone] [address] [place] , [place] [address] Email : [email or web address]

## 2022-11-19 NOTE — Telephone Encounter (Addendum)
Patient seen by his PCP for the sore throat. Added to the patient list for appointment next opening.

## 2022-11-19 NOTE — Telephone Encounter (Signed)
Pt last saw Willette Cluster will send accordingly

## 2022-11-20 ENCOUNTER — Encounter: Payer: Self-pay | Admitting: Physical Therapy

## 2022-11-20 ENCOUNTER — Ambulatory Visit: Payer: Medicare Other | Attending: Nurse Practitioner | Admitting: Physical Therapy

## 2022-11-20 ENCOUNTER — Other Ambulatory Visit: Payer: Self-pay

## 2022-11-20 ENCOUNTER — Telehealth: Payer: Self-pay | Admitting: *Deleted

## 2022-11-20 DIAGNOSIS — M40203 Unspecified kyphosis, cervicothoracic region: Secondary | ICD-10-CM | POA: Diagnosis not present

## 2022-11-20 DIAGNOSIS — R296 Repeated falls: Secondary | ICD-10-CM | POA: Insufficient documentation

## 2022-11-20 DIAGNOSIS — W19XXXD Unspecified fall, subsequent encounter: Secondary | ICD-10-CM | POA: Insufficient documentation

## 2022-11-20 DIAGNOSIS — M6281 Muscle weakness (generalized): Secondary | ICD-10-CM | POA: Insufficient documentation

## 2022-11-20 NOTE — Therapy (Signed)
OUTPATIENT PHYSICAL THERAPY NEURO EVALUATION   Patient Name: Brent Griffith MRN: [id number] DOB:Nov 27, 1935, 87 y.o., male Today's Date: 11/20/2022   PCP: Brent Chatters NP REFERRING PROVIDER: [name]  END OF SESSION:  PT End of Session - 11/20/22 1239     Visit Number 1    Date for PT Re-Evaluation 01/15/23    Authorization Type [employer]    PT Start Time 1233    PT Stop Time 1315    PT Time Calculation (min) 42 min    Activity Tolerance Patient tolerated treatment well             Past Medical History:  Diagnosis Date   Acute gastric ulcer without mention of hemorrhage, perforation, or obstruction    Allergic rhinitis, cause unspecified    Anemia, unspecified    Arthritis    Asthma    Blood transfusion without reported diagnosis    CAD (coronary artery disease)    Diaphragmatic hernia without mention of obstruction or gangrene    Elevated prostate specific antigen (PSA)    Enlarged prostate    Esophageal reflux    Extrinsic asthma, unspecified    Herpes zoster without mention of complication    Hyperlipidemia    Hypersomnia with sleep apnea, unspecified    Impotence of organic origin    Kyphosis    Lumbago    Obstructive sleep apnea (adult) (pediatric)    Pneumonia    hx of years ago    Senile osteoporosis    Trigger finger (acquired)    Tubular adenoma of colon 05/2013   Unspecified essential hypertension    Unspecified sleep apnea    CPAP- settings 4-12    Unspecified vitamin D deficiency    Past Surgical History:  Procedure Laterality Date   CARDIAC CATHETERIZATION  03/04/2009   Dr Brent Griffith   COLONOSCOPY     CORONARY ARTERY BYPASS GRAFT  2010   CYSTOSCOPY WITH RETROGRADE PYELOGRAM, URETEROSCOPY AND STENT PLACEMENT Right 10/04/2013   Procedure: CYSTOSCOPY WITH RETROGRADE PYELOGRAM,  AND STENT PLACEMENT;  Surgeon: Brent Field, MD;  Location: [facility];  Service: Urology;  Laterality: Right;   DENTAL SURGERY N/A    MOLE REMOVAL   1958   chin   ROTATOR CUFF REPAIR Left 04/1998   with bone spur removed, Dr Madelon Griffith   TONSILLECTOMY  1945   TRANSURETHRAL RESECTION OF PROSTATE N/A 09/24/2013   Procedure: TRANSURETHRAL RESECTION OF THE PROSTATE (TURP) WITH GYRUS (STAGED RIGHT LATERAL AND MEDIAN LOBE);  Surgeon: Brent Ludwig, MD;  Location: [facility];  Service: Urology;  Laterality: N/A;   UPPER GI ENDOSCOPY     Patient Active Problem List   Diagnosis Date Noted   CKD (chronic kidney disease) stage 3, GFR 30-59 ml/min (HCC) 07/06/2022   Encounter for therapeutic drug monitoring 10/25/2021   Atrial fibrillation (HCC) 10/25/2021   PVC's (premature ventricular contractions) 05/04/2021   COVID-19 virus infection 02/20/2021   Sinusitis 01/24/2021   New onset atrial fibrillation (HCC) 01/19/2021   AKI (acute kidney injury) (HCC) 01/19/2021   HFrEF (heart failure with reduced ejection fraction) (HCC) 01/19/2021   CAD (coronary artery disease) 10/11/2017   IDA (iron deficiency anemia) 08/31/2016   Seasonal and perennial allergic rhinitis 08/28/2016   Palpitations 09/07/2015   Cough, persistent 07/20/2015   Senile osteoporosis 06/09/2014   Shortness of breath 03/20/2014   Hydronephrosis of right kidney 10/03/2013   Benign prostatic hyperplasia 09/24/2013   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   Dyslipidemia 04/06/2009  CARDIOVASCULAR FUNCTION STUDY, ABNORMAL 03/03/2009   Obstructive sleep apnea 02/04/2009   Essential hypertension 09/02/2008   GERD (gastroesophageal reflux disease) 09/02/2008   PERIODIC LIMB MOVEMENT DISORDER 09/03/2007    ONSET DATE: 6 months  REFERRING DIAG:  W19.XXXD (ICD-10-CM) - Fall, subsequent encounter  M40.203 (ICD-10-CM) - Kyphosis of cervicothoracic region, unspecified kyphosis type    THERAPY DIAG:  Frequent falls;   Rationale for Evaluation and Treatment: Rehabilitation  SUBJECTIVE:                                                                                                                                                                                              SUBJECTIVE STATEMENT: I've been diagnosed with gastric reflux.  I've lost weight and now I'm down to 107#.  I've gotten weak.  Recently rolled off of the bed recently. Falls are trips mostly.  Tripped up curb at church and hit head.  CT scan normal.  Hit head another time as well when tripped on sheet hanging off the side of the bed.  Had a tree fall and used a saw to cut branches and caused some shoulder/back pain.  C4 CT scan had not changed.  Fatigue more than pain. Gets back brace tomorrow from [employer] in [place] Phone shut off (unable to pay the bill)  Pt accompanied by: significant other Brent Griffith in lobby  PERTINENT HISTORY: cardiac disease; osteoporosis; HTN; heart failure; kidney disease Phone shut off (unable to pay the bill) Food insecurity PAIN:  PAIN:  Are you having pain? No, just a sore throat from reflux NPRS scale: 0/10 Pain location: history of neck/back pain   PRECAUTIONS: Fall     WEIGHT BEARING RESTRICTIONS: No  FALLS: Has patient fallen in last 6 months? Yes. Number of falls 2 but pt able to recount more than 2 falls in subjective  LIVING ENVIRONMENT: Lives with: lives with their spouse Lives in: House/apartment    PLOF: Independent  PATIENT GOALS: get stronger to do things around the house (put up ceiling fan in living room);  I'm worried about falling  (forward direction)  OBJECTIVE:   DIAGNOSTIC FINDINGS: CT scans: head, neck, back normal after falls  COGNITION: Overall cognitive status: Within functional limits for tasks assessed   POSTURE: very rounded shoulders, forward head, and increased thoracic kyphosis  LOWER EXTREMITY ROM:  WFLs   LOWER EXTREMITY MMT:  grossly 4-/5 (unable to rise from the chair without UE support)  GAIT: Comments: decreased stride length, dec gait speed  FUNCTIONAL TESTS:  Unable to rise without UE assist; 5x sit to stand with UE use mod  18.20 TUG with  hands 18.20 unsteady upon return and sits on corner of chair  PATIENT SURVEYS:  ABC scale 80% on 14 questions (eliminated carrying packages on escalator and walking on icy sidewalks)pt notes he needs UE support for reaching to the floor and reaching overhead)  MINI-BESTest: Balance Evaluation Systems Test (partial components) ANTICIPATORY: SIT TO STAND: (2) normal without use of hands and stabilizes independently     (1) Moderate comes to stand WITH use of hands on 1st attempt    (0) Severe: unable to stand up from chair without assistance OR needs several attempts with use of   hands Score: 0   RISE TO TOES: feet shoulder width apart. Hands on hips.  Rise as high as you can onto your toes. Try to hold this pose for 3 sec                          (2) stable for 3s with maximum height    (1) heels up, but not full range (smaller than when holding hands) OR noticeable instability for 3 sec                           (0) < or equalto 3 s  Score: 0   STAND ON ONE LEG: look straight ahead. Hands on hips. Lift your leg off the ground.     Left:  trial 1 _2___ trial 2___2_                                    Right: Trial 1:___2__   Trial 2:____2___   (2) 20 s       (2)  20 s                (1) < 20      (1) < 20 s   (0) Unable      (0) unable Score (use the worst side): 1   SENSORY ORIENTATION STANCE FEET TOGETHER EYES OPEN  firm surface Be as stable and still as possible until I say stop   (2) 30s   (1) < 30 s   (0) unable Score: 1 pt did well for 20 sec then sat down quickly in chair    STANCE ON FOAM EYES CLOSED feet together, eyes closed, hands on hips. Be as stable and still as you can until I stay stop.  I will start timing when you close your eyes. Time in seconds:      (2) 30s   (1) <30 s   (0) unable Score: 0 unable to attempt   INCLINE EYES CLOSED: Stand on incline with feet shoulder width apart, arms down at your sides.  I will start timing when you close  your eyes   (2) Stands 30 s and aligns with gravity   (1) stands < 30 s OR aligns with surface   (0) unable Score:  0   TIMED UP AND GO NORMAL AND WITH DUAL TASK: 3 METER WALK: When I say go walk at your your normal speed across the tape, turn around and come back to sit in the chair   Time:  18.20 Count backwards by 3s starting at    20.  When I say go, stand up from the chair, walk at your normal speed across the tape on the floor, turn around, come back  to sit in the chair.  Continue counting backwards the entire time.  Time:   (2) No noticeable change in sitting, standing or walking while backward counting when compared to TUG without dual task   (1) Dual task affects either counting OR walking (>10%)    (0) stops counting while walking OR stops walking while counting If gait speed slows more than 10% between TUG without and with dual task, the score should be decreased by a point Score: 0 TUG: 24.13 sec dual task       TODAY'S TREATMENT:                                                                                                                              DATE: 7/16    PATIENT EDUCATION: Education details: discussion of plan of care Person educated: Patient Education method: Explanation Education comprehension: verbalized understanding  HOME EXERCISE PROGRAM: To be started  GOALS: Goals reviewed with patient? Yes  SHORT TERM GOALS: Target date: 12/18/2022  The patient will demonstrate knowledge of basic self care strategies and exercises to promote strength and balance Baseline: Goal status: INITIAL  2.  Improved LE strength needed to rise from a chair with greater ease; 5x STS in 16.5 sec Baseline:  Goal status: INITIAL  3.  The patient will have improved Timed Up and Go (TUG) time to    16.5   sec indicating improved gait speed and LE strength  Baseline:  Goal status: INITIAL  4.  Short Physical Physical Battery score improved by 1 point indicating reduced  frailty Baseline:  Goal status: INITIAL     LONG TERM GOALS: Target date: 01/15/2023   The patient will be independent in a safe self progression of a home exercise program to promote further recovery of function  Baseline:  Goal status: INITIAL  2.  The patient will have improved LE strength and balance to squat to pick up an object from the floor or reach overhead with holding onto something Baseline:  Goal status: INITIAL  3.  The patient will have improved Timed Up and Go (TUG) time to  15     sec indicating improved gait speed and LE strength  Baseline:  Goal status: INITIAL  4.  Short Physical Performance Battery score improved by 2 points indicating decreased risk of falls and improved strength Baseline:  Goal status: INITIAL  5.  Activities specific Balance Confidence scale improved to 85 (14 items rather 16) Baseline:  Goal status: INITIAL    ASSESSMENT:  CLINICAL IMPRESSION: Patient is a 87 y.o. male who was seen today for physical therapy evaluation and treatment for falls and kyphosis of cervicothoracic region.   The patient is well-known to this therapist from prior treatment last August to December for neck pain related to kyphosis.  Since that time the patient has had an overall decline in function with several falls and ED visits.  He reports recent weight loss related  to his diagnosis of gastric reflux and his inability to eat much.  This lack of caloric intake likely contributes to generalized weakness and balance deficits.  Severe kyphosis also impacts balance. He is currently unable to rise from a standard chair without significant use of his hands to rise.  Limitations with anticipatory balance and sensory orientation noted.  As he works with his medical team to address these GI issues, he would benefit from PT to address strength and balance impairments.    OBJECTIVE IMPAIRMENTS: decreased activity tolerance, decreased balance, decreased endurance, decreased  mobility, difficulty walking, decreased strength, impaired perceived functional ability, impaired UE functional use, and postural dysfunction.   ACTIVITY LIMITATIONS: carrying, lifting, bending, standing, squatting, stairs, reach over head, hygiene/grooming, locomotion level, and caring for others  PARTICIPATION LIMITATIONS: meal prep, cleaning, laundry, interpersonal relationship, driving, shopping, community activity, and yard work  PERSONAL FACTORS: Social background, Time since onset of injury/illness/exacerbation, and 3+ comorbidities: cardiac, HTN, kidney disease  are also affecting patient's functional outcome.  Food and financial insecurity  REHAB POTENTIAL: Good  CLINICAL DECISION MAKING: Evolving/moderate complexity  EVALUATION COMPLEXITY: Moderate  PLAN:  PT FREQUENCY: 2x/week  PT DURATION: 8 weeks  PLANNED INTERVENTIONS: Therapeutic exercises, Therapeutic activity, Neuromuscular re-education, Balance training, Gait training, Patient/Family education, Self Care, Cryotherapy, Moist heat, Manual therapy, and Re-evaluation  PLAN FOR NEXT SESSION: do SPPB for frailty assessment; follow up in 2 weeks after pt sees GI;  general strengthening, low level balance; check vitals; grip strength  Lavinia Sharps, PT 11/20/22 5:27 PM Phone: [phone] Fax: [phone]

## 2022-11-20 NOTE — Telephone Encounter (Signed)
   Telephone encounter was:  Unsuccessful.  11/20/2022 Name: Brent Griffith MRN: [id number] DOB: 1935-05-20  Unsuccessful outbound call made today to assist with:  Food Insecurity  Outreach Attempt:  2nd Attempt  A HIPAA compliant voice message was left requesting a return call.  Instructed patient to call back at [phone].  [name] -[facility], Population Health [phone] [address] [place] , [place] [address] Email : [email or web address]

## 2022-11-21 DIAGNOSIS — M40292 Other kyphosis, cervical region: Secondary | ICD-10-CM | POA: Diagnosis not present

## 2022-11-21 DIAGNOSIS — M40294 Other kyphosis, thoracic region: Secondary | ICD-10-CM | POA: Diagnosis not present

## 2022-11-23 ENCOUNTER — Telehealth: Payer: Self-pay | Admitting: *Deleted

## 2022-11-23 NOTE — Telephone Encounter (Signed)
   Telephone encounter was:  Successful.  11/23/2022 Name: Brent Griffith MRN: [id number] DOB: 11/24/1935  Brent Griffith is a 87 y.o. year old male who is a primary care patient of Sharon Seller, NP . The community resource team was consulted for assistance with Food Insecurity  Care guide performed the following interventions: Patient provided with information about care guide support team and interviewed to confirm resource needs.  Follow Up Plan:  No further follow up  Alois Cliche -[facility], Population Health [phone] [address] [place] , [place] [address] Email : [email or web address]

## 2022-11-27 ENCOUNTER — Ambulatory Visit: Payer: Medicare Other | Admitting: Physician Assistant

## 2022-11-30 ENCOUNTER — Ambulatory Visit: Payer: Medicare Other | Attending: Cardiology

## 2022-11-30 ENCOUNTER — Other Ambulatory Visit: Payer: Self-pay | Admitting: Pharmacist Clinician (PhC)/ Clinical Pharmacy Specialist

## 2022-11-30 DIAGNOSIS — Z5181 Encounter for therapeutic drug level monitoring: Secondary | ICD-10-CM

## 2022-11-30 DIAGNOSIS — I4891 Unspecified atrial fibrillation: Secondary | ICD-10-CM

## 2022-11-30 LAB — POCT INR: INR: 1.7 — AB (ref 2.0–3.0)

## 2022-11-30 MED ORDER — EMPAGLIFLOZIN 10 MG PO TABS: 10.0000 mg | ORAL_TABLET | Freq: Every day | ORAL | Status: DC

## 2022-11-30 NOTE — Patient Instructions (Signed)
TAKE ANOTHER 0.5 TABLET TODAY ONLY Continue taking Warfarin to 1/2 tablet daily  Recheck INR in 2 weeks  [facility] [phone]

## 2022-12-06 ENCOUNTER — Other Ambulatory Visit (HOSPITAL_BASED_OUTPATIENT_CLINIC_OR_DEPARTMENT_OTHER): Payer: Self-pay | Admitting: Cardiology

## 2022-12-10 ENCOUNTER — Encounter: Payer: Self-pay | Admitting: Nurse Practitioner

## 2022-12-10 DIAGNOSIS — K219 Gastro-esophageal reflux disease without esophagitis: Secondary | ICD-10-CM

## 2022-12-11 ENCOUNTER — Telehealth: Payer: Self-pay

## 2022-12-11 NOTE — Telephone Encounter (Signed)
Patient called and left message on clinical intake voicemail requesting refill on atenolol. Atenolol is not on patient active medication list. Looks like medication was discontinued a while ago. I returned call to patient to clarify his request. I left a message for patient to call the office.

## 2022-12-13 ENCOUNTER — Ambulatory Visit: Payer: Medicare Other | Admitting: Physical Therapy

## 2022-12-13 NOTE — Progress Notes (Signed)
Chief Complaint: Odynophagia, GERD, Hospital follow-up Primary GI MD: Dr. Russella Dar  HPI: 87 year old male history of GERD, adenomatous polyps, chronic combined systolic/diastolic heart failure, CAD s/p CABG, PAF (on chronic anticoagulation), BPH, presents for evaluation of.  Last seen 12/2021 by Willette Cluster, NP  At last visit it was decided not to proceed with surveillance colonoscopy given patient's age and comorbidities.  He was having no issues at that time.  He does have chronic iron deficiency anemia dating back to at least 2018 in which she was followed by hematology and received IV iron. At last visit advised to refer back to hematology.  Recent CBC with Hgb 12.6 (improved from 10.6 last year)  Echo with EF 40 to 45%  Recently seen at [facility] 10/21/2022 for difficulty swallowing with pain.  He had felt like a pill got stuck in his throat but then it had passed on its own.  Overall workup was unrevealing.  Patient had relief of symptoms with GI cocktail and IV Protonix.    Patient's wife states prior to hospital visit he had run out of his Protonix for many weeks.  He had increasing GERD/sore throat and eventual odynophagia.  Since hospital visit he has had a refill of his Protonix.  Takes Protonix in the morning and famotidine at bedtime.  His symptoms are overall well-controlled.  Denies further dysphagia/odynophagia.  Denies worsening GERD.  Tolerating diet without difficulty.  Patient has severe kyphosis to the point where he uses a device that holds his chin up (neck posture corrector).  He states if he does not use it he will forget to hold his head up.  If he is not using it and tries to swallow anything he will choke.  PREVIOUS GI WORKUP    A 6 mm tubular adenoma was removed at last colonoscopy January 2015.  Based on current guidelines he would have been due for a 7-year interval colonoscopy in January 2022. No future surveillance advised at this time.  Past  Medical History:  Diagnosis Date   Acute gastric ulcer without mention of hemorrhage, perforation, or obstruction    Allergic rhinitis, cause unspecified    Anemia, unspecified    Arthritis    Asthma    Blood transfusion without reported diagnosis    CAD (coronary artery disease)    Diaphragmatic hernia without mention of obstruction or gangrene    Elevated prostate specific antigen (PSA)    Enlarged prostate    Esophageal reflux    Extrinsic asthma, unspecified    Herpes zoster without mention of complication    Hyperlipidemia    Hypersomnia with sleep apnea, unspecified    Impotence of organic origin    Kyphosis    Lumbago    Obstructive sleep apnea (adult) (pediatric)    Pneumonia    hx of years ago    Senile osteoporosis    Trigger finger (acquired)    Tubular adenoma of colon 05/2013   Unspecified essential hypertension    Unspecified sleep apnea    CPAP- settings 4-12    Unspecified vitamin D deficiency     Past Surgical History:  Procedure Laterality Date   CARDIAC CATHETERIZATION  03/04/2009   Dr Dorris Fetch   COLONOSCOPY     CORONARY ARTERY BYPASS GRAFT  2010   CYSTOSCOPY WITH RETROGRADE PYELOGRAM, URETEROSCOPY AND STENT PLACEMENT Right 10/04/2013   Procedure: CYSTOSCOPY WITH RETROGRADE PYELOGRAM,  AND STENT PLACEMENT;  Surgeon: Jerilee Field, MD;  Location: [facility];  Service: Urology;  Laterality: Right;   DENTAL SURGERY N/A    MOLE REMOVAL  1958   chin   ROTATOR CUFF REPAIR Left 04/1998   with bone spur removed, Dr Madelon Lips   TONSILLECTOMY  1945   TRANSURETHRAL RESECTION OF PROSTATE N/A 09/24/2013   Procedure: TRANSURETHRAL RESECTION OF THE PROSTATE (TURP) WITH GYRUS (STAGED RIGHT LATERAL AND MEDIAN LOBE);  Surgeon: Kathi Ludwig, MD;  Location: [facility];  Service: Urology;  Laterality: N/A;   UPPER GI ENDOSCOPY      Current Outpatient Medications  Medication Sig Dispense Refill   albuterol (VENTOLIN HFA) 108 (90 Base) MCG/ACT inhaler Inhale 1-2  puffs into the lungs every 6 (six) hours as needed for wheezing or shortness of breath. 34 g 3   alum & mag hydroxide-simeth (MAALOX MAX) 400-400-40 MG/5ML suspension Take 10 mLs by mouth every 6 (six) hours as needed (pain with swallowing and reflux). 355 mL 0   amiodarone (PACERONE) 200 MG tablet TAKE 1 TABLET BY MOUTH DAILY 90 tablet 3   Calcium Citrate-Vitamin D (EQ CALCIUM CITRATE+D3 PO) Take 1,200 mg by mouth daily.     carvedilol (COREG) 3.125 MG tablet Take 1 tablet (3.125 mg total) by mouth 2 (two) times daily. Please keep scheduled appointment with cardiologist 180 tablet 3   Cholecalciferol (VITAMIN D-3) 5000 units TABS Take 10,000 Units by mouth daily.     Coenzyme Q10 (COQ10) 200 MG CAPS Take 200 mg by mouth daily.     doxazosin (CARDURA) 2 MG tablet TAKE 1 TABLET BY MOUTH DAILY 100 tablet 0   empagliflozin (JARDIANCE) 10 MG TABS tablet Take 1 tablet (10 mg total) by mouth daily before breakfast.     famotidine (PEPCID) 20 MG tablet Take 1 tablet (20 mg total) by mouth 2 (two) times daily. 60 tablet 0   fexofenadine (ALLEGRA) 180 MG tablet Take 180 mg by mouth daily as needed for allergies.     finasteride (PROSCAR) 5 MG tablet TAKE 1 TABLET BY MOUTH IN  THE MORNING 90 tablet 3   furosemide (LASIX) 20 MG tablet TAKE 1 TABLET BY MOUTH DAILY AS  NEEDED FOR LEG SWELLING 100 tablet 2   HYDROcodone-acetaminophen (NORCO/VICODIN) 5-325 MG tablet Take 1 tablet by mouth every 6 (six) hours as needed for moderate pain. 14 tablet 0   Iron, Ferrous Sulfate, 325 (65 Fe) MG TABS Take 325 mg by mouth 2 (two) times daily. 30 tablet    losartan (COZAAR) 25 MG tablet Take 1 tablet (25 mg total) by mouth daily. Please keep scheduled appointment with cardiologist 100 tablet 2   mexiletine (MEXITIL) 150 MG capsule Take 1 capsule (150 mg total) by mouth 2 (two) times daily. 180 capsule 2   mirtazapine (REMERON) 7.5 MG tablet Take 1 tablet (7.5 mg total) by mouth at bedtime. 30 tablet 3   pantoprazole  (PROTONIX) 40 MG tablet TAKE 1 TABLET BY MOUTH DAILY 100 tablet 2   rosuvastatin (CRESTOR) 20 MG tablet TAKE 1 TABLET BY MOUTH ONCE  DAILY FOR CHOLESTEROL 100 tablet 2   warfarin (COUMADIN) 2.5 MG tablet TAKE 1/2 TO 1 TABLET BY MOUTH  DAILY AS DIRECTED BY THE  COUMADIN CLINIC 80 tablet 1   No current facility-administered medications for this visit.    Allergies as of 12/14/2022 - Review Complete 11/20/2022  Allergen Reaction Noted   Compazine [prochlorperazine edisylate] Anxiety 06/04/2012    Family History  Problem Relation Age of Onset   Stroke Mother    Breast cancer Mother  Cirrhosis Mother        wine   Rheumatic fever Mother    Kyphosis Mother    Hypertension Father    Stroke Father    Heart disease Father    Heart disease Paternal Grandfather    Colon cancer Neg Hx     Social History   Socioeconomic History   Marital status: Married    Spouse name: Not on file   Number of children: 0   Years of education: Not on file   Highest education level: Not on file  Occupational History   Occupation: Network engineer  Tobacco Use   Smoking status: Never    Passive exposure: Never   Smokeless tobacco: Never  Vaping Use   Vaping status: Never Used  Substance and Sexual Activity   Alcohol use: No   Drug use: No   Sexual activity: Not Currently  Other Topics Concern   Not on file  Social History Narrative   Not on file   Social Determinants of Health   Financial Resource Strain: Not on file  Food Insecurity: Food Insecurity Present (04/03/2022)   Hunger Vital Sign    Worried About Running Out of Food in the Last Year: Sometimes true    Ran Out of Food in the Last Year: Sometimes true  Transportation Needs: No Transportation Needs (04/13/2022)   PRAPARE - Administrator, Civil Service (Medical): No    Lack of Transportation (Non-Medical): No  Physical Activity: Not on file  Stress: Not on file  Social Connections: Not on file  Intimate  Partner Violence: Not on file    Review of Systems:    Constitutional: No weight loss, fever, chills, weakness or fatigue HEENT: Eyes: No change in vision               Ears, Nose, Throat:  No change in hearing or congestion Skin: No rash or itching Cardiovascular: No chest pain, chest pressure or palpitations   Respiratory: No SOB or cough Gastrointestinal: See HPI and otherwise negative Genitourinary: No dysuria or change in urinary frequency Neurological: No headache, dizziness or syncope Musculoskeletal: No new muscle or joint pain Hematologic: No bleeding or bruising Psychiatric: No history of depression or anxiety    Physical Exam:  Vital signs: There were no vitals taken for this visit.  Constitutional: Frail, thin, chronically ill-appearing male  head:  Normocephalic and atraumatic. Eyes:   PEERL, EOMI. No icterus. Conjunctiva pink. Respiratory: Respirations even and unlabored. Lungs clear to auscultation bilaterally.   No wheezes, crackles, or rhonchi.  Cardiovascular:  Regular rate and rhythm. No peripheral edema, cyanosis or pallor.  Gastrointestinal:  Soft, nondistended, nontender. No rebound or guarding. Normal bowel sounds. No appreciable masses or hepatomegaly. Rectal:  Not performed.  Msk:  Symmetrical without gross deformities. Without edema, no deformity or joint abnormality.  Neurologic:  Alert and  oriented x4;  grossly normal neurologically.  Skin:   Dry and intact without significant lesions or rashes. Psychiatric: Oriented to person, place and time. Demonstrates good judgement and reason without abnormal affect or behaviors.   RELEVANT LABS AND IMAGING: CBC    Component Value Date/Time   WBC 9.8 10/22/2022 1557   RBC 3.96 (L) 10/22/2022 1557   HGB 12.6 (L) 10/22/2022 1557   HGB 12.9 (L) 12/28/2016 1508   HCT 36.8 (L) 10/22/2022 1557   HCT 36.9 (L) 12/28/2016 1508   PLT 235 10/22/2022 1557   PLT 164 12/28/2016 1508   MCV 92.9  10/22/2022 1557   MCV  88 12/28/2016 1508   MCH 31.8 10/22/2022 1557   MCHC 34.2 10/22/2022 1557   RDW 11.8 10/22/2022 1557   RDW 12.2 12/28/2016 1508   LYMPHSABS 1,019 10/22/2022 1557   LYMPHSABS 1.6 12/28/2016 1508   MONOABS 0.8 10/21/2022 0149   EOSABS 0 (L) 10/22/2022 1557   EOSABS 0.1 12/28/2016 1508   BASOSABS 10 10/22/2022 1557   BASOSABS 0.0 12/28/2016 1508    CMP     Component Value Date/Time   NA 139 10/22/2022 1557   NA 138 08/31/2022 1515   K 4.4 10/22/2022 1557   K 4.1 08/23/2016 1312   CL 104 10/22/2022 1557   CL 103 08/23/2016 1312   CO2 28 10/22/2022 1557   CO2 26 08/23/2016 1312   GLUCOSE 89 10/22/2022 1557   BUN 34 (H) 10/22/2022 1557   BUN 25 08/31/2022 1515   CREATININE 1.36 (H) 10/22/2022 1557   CALCIUM 9.3 10/22/2022 1557   CALCIUM 8.7 08/23/2016 1312   PROT 5.8 (L) 08/31/2022 1515   ALBUMIN 4.0 08/31/2022 1515   ALBUMIN 4.0 08/23/2016 1312   AST 27 08/31/2022 1515   AST 18 08/23/2016 1312   ALT 23 08/31/2022 1515   ALT 15 08/23/2016 1312   ALKPHOS 59 08/31/2022 1515   ALKPHOS 45 08/23/2016 1312   BILITOT 1.0 08/31/2022 1515   GFRNONAA 39 (L) 10/21/2022 0149   GFRNONAA 63 10/31/2020 1549   GFRAA 73 10/31/2020 1549     Assessment/Plan:   Gastroesophageal reflux disease, unspecified whether esophagitis present Odynophagia Suspect patient had severe rebound symptoms from being off of his antacid regimen that led to his symptoms in the ED.  The symptoms have since resolved and he is doing well being back on Protonix once daily and famotidine once daily.  His kyphosis with his neck posture corrector and presence of choking when eating if the posture corrector is not in place is more functional in nature. --- Extensive discussion with patient and wife on GERD and educated them on lifestyle modifications as well as providing handouts --- Continue current antacid regimen --- Symptoms have resolved, patient has extensive comorbidities, EGD is not indicated at this time.   Continue with conservative management --- Follow-up as needed  [facility] 12/13/2022, 2:34 PM  Cc: Sharon Seller, NP

## 2022-12-14 ENCOUNTER — Ambulatory Visit: Payer: Medicare Other | Attending: Cardiology

## 2022-12-14 ENCOUNTER — Ambulatory Visit: Payer: Medicare Other | Admitting: Gastroenterology

## 2022-12-14 ENCOUNTER — Encounter: Payer: Self-pay | Admitting: Gastroenterology

## 2022-12-14 VITALS — BP 156/80 | HR 64 | Ht 62.0 in | Wt 118.2 lb

## 2022-12-14 DIAGNOSIS — I4891 Unspecified atrial fibrillation: Secondary | ICD-10-CM

## 2022-12-14 DIAGNOSIS — Z5181 Encounter for therapeutic drug level monitoring: Secondary | ICD-10-CM | POA: Diagnosis not present

## 2022-12-14 DIAGNOSIS — K219 Gastro-esophageal reflux disease without esophagitis: Secondary | ICD-10-CM | POA: Diagnosis not present

## 2022-12-14 DIAGNOSIS — R1310 Dysphagia, unspecified: Secondary | ICD-10-CM | POA: Diagnosis not present

## 2022-12-14 LAB — POCT INR: INR: 1.3 — AB (ref 2.0–3.0)

## 2022-12-14 NOTE — Patient Instructions (Addendum)
TAKE ANOTHER 1.5 TABLETS TODAY ONLY THEN INCREASE  to 1/2 tablet daily, EXCEPT 1 TABLET ON MONDAY and FRIDAY.  Recheck INR in 2 weeks     3 days per week of Boost or Ensure.  No Boost or Ensure until Monday.  [facility] [phone]

## 2022-12-14 NOTE — Patient Instructions (Signed)
Follow up as needed  _______________________________________________________  If your blood pressure at your visit was 140/90 or greater, please contact your primary care physician to follow up on this.  _______________________________________________________  If you are age 85 or older, your body mass index should be between 23-30. Your Body mass index is 21.63 kg/m. If this is out of the aforementioned range listed, please consider follow up with your Primary Care Provider.  If you are age 43 or younger, your body mass index should be between 19-25. Your Body mass index is 21.63 kg/m. If this is out of the aformentioned range listed, please consider follow up with your Primary Care Provider.   ________________________________________________________  The [place] [facility] providers would like to encourage you to use [facility] to communicate with providers for non-urgent requests or questions.  Due to long hold times on the telephone, sending your provider a message by [facility] may be a faster and more efficient way to get a response.  Please allow 48 business hours for a response.  Please remember that this is for non-urgent requests.  _______________________________________________________   I appreciate the  opportunity to care for you  Thank You   [facility]

## 2022-12-17 ENCOUNTER — Ambulatory Visit: Payer: Self-pay

## 2022-12-17 MED ORDER — ROSUVASTATIN CALCIUM 20 MG PO TABS: ORAL_TABLET | ORAL | 1 refills | Status: DC

## 2022-12-17 MED ORDER — PANTOPRAZOLE SODIUM 40 MG PO TBEC
40.0000 mg | DELAYED_RELEASE_TABLET | Freq: Every day | ORAL | 1 refills | Status: DC
Start: 2022-12-17 — End: 2023-03-01

## 2022-12-17 NOTE — Progress Notes (Deleted)
HPI: FU coronary artery disease and atrial fibrillation. Cardiac catheterization on March 04, 2009 revealed severe three-vessel coronary disease as well as an 80% left main. His ejection fraction was 50%. He ultimately underwent coronary artery bypass and graft on November 1 (left internal mammary artery to left anterior descending, sequential saphenous vein graft to ramus intermedius and obtuse marginal 2, saphenous vein graft to posterior descending. Nuclear study April 2018 showed ejection fraction 48%. There was prior inferolateral infarct and mild apical ischemia.  Echocardiogram repeated September 2022 and showed ejection fraction 40 to 45%, mild mitral regurgitation, mild aortic insufficiency.  Monitor November 2022 showed sinus rhythm with occasional PAC, 5 beats PAT and occasional PVC.  Diagnosed with atrial fibrillation September 2022.  Started on amiodarone.  Also noted to have PVCs and mexiletine was added.  Since he was last seen,   Current Outpatient Medications  Medication Sig Dispense Refill   albuterol (VENTOLIN HFA) 108 (90 Base) MCG/ACT inhaler Inhale 1-2 puffs into the lungs every 6 (six) hours as needed for wheezing or shortness of breath. 34 g 3   alum & mag hydroxide-simeth (MAALOX MAX) 400-400-40 MG/5ML suspension Take 10 mLs by mouth every 6 (six) hours as needed (pain with swallowing and reflux). 355 mL 0   amiodarone (PACERONE) 200 MG tablet TAKE 1 TABLET BY MOUTH DAILY 90 tablet 3   Calcium Citrate-Vitamin D (EQ CALCIUM CITRATE+D3 PO) Take 1,200 mg by mouth daily.     carvedilol (COREG) 3.125 MG tablet Take 1 tablet (3.125 mg total) by mouth 2 (two) times daily. Please keep scheduled appointment with cardiologist 180 tablet 3   Cholecalciferol (VITAMIN D-3) 5000 units TABS Take 10,000 Units by mouth daily.     Coenzyme Q10 (COQ10) 200 MG CAPS Take 200 mg by mouth daily.     doxazosin (CARDURA) 2 MG tablet TAKE 1 TABLET BY MOUTH DAILY 100 tablet 0   empagliflozin  (JARDIANCE) 10 MG TABS tablet Take 1 tablet (10 mg total) by mouth daily before breakfast.     famotidine (PEPCID) 20 MG tablet Take 1 tablet (20 mg total) by mouth 2 (two) times daily. 60 tablet 0   fexofenadine (ALLEGRA) 180 MG tablet Take 180 mg by mouth daily as needed for allergies.     finasteride (PROSCAR) 5 MG tablet TAKE 1 TABLET BY MOUTH IN  THE MORNING 90 tablet 3   furosemide (LASIX) 20 MG tablet TAKE 1 TABLET BY MOUTH DAILY AS  NEEDED FOR LEG SWELLING 100 tablet 2   HYDROcodone-acetaminophen (NORCO/VICODIN) 5-325 MG tablet Take 1 tablet by mouth every 6 (six) hours as needed for moderate pain. 14 tablet 0   Iron, Ferrous Sulfate, 325 (65 Fe) MG TABS Take 325 mg by mouth 2 (two) times daily. 30 tablet    losartan (COZAAR) 25 MG tablet Take 1 tablet (25 mg total) by mouth daily. Please keep scheduled appointment with cardiologist 100 tablet 2   mexiletine (MEXITIL) 150 MG capsule Take 1 capsule (150 mg total) by mouth 2 (two) times daily. 180 capsule 2   mirtazapine (REMERON) 7.5 MG tablet Take 1 tablet (7.5 mg total) by mouth at bedtime. 30 tablet 3   pantoprazole (PROTONIX) 40 MG tablet Take 1 tablet (40 mg total) by mouth daily. 90 tablet 1   rosuvastatin (CRESTOR) 20 MG tablet TAKE 1 TABLET BY MOUTH ONCE DAILY FOR CHOLESTEROL 90 tablet 1   warfarin (COUMADIN) 2.5 MG tablet TAKE 1/2 TO 1 TABLET BY MOUTH  DAILY AS  DIRECTED BY THE  COUMADIN CLINIC 80 tablet 1   No current facility-administered medications for this visit.     Past Medical History:  Diagnosis Date   Acute gastric ulcer without mention of hemorrhage, perforation, or obstruction    Allergic rhinitis, cause unspecified    Anemia, unspecified    Arthritis    Asthma    Blood transfusion without reported diagnosis    CAD (coronary artery disease)    Diaphragmatic hernia without mention of obstruction or gangrene    Elevated prostate specific antigen (PSA)    Enlarged prostate    Esophageal reflux    Extrinsic  asthma, unspecified    Herpes zoster without mention of complication    Hyperlipidemia    Hypersomnia with sleep apnea, unspecified    Impotence of organic origin    Kyphosis    Lumbago    Obstructive sleep apnea (adult) (pediatric)    Pneumonia    hx of years ago    Senile osteoporosis    Trigger finger (acquired)    Tubular adenoma of colon 05/2013   Unspecified essential hypertension    Unspecified sleep apnea    CPAP- settings 4-12    Unspecified vitamin D deficiency     Past Surgical History:  Procedure Laterality Date   CARDIAC CATHETERIZATION  03/04/2009   Dr Dorris Fetch   COLONOSCOPY     CORONARY ARTERY BYPASS GRAFT  2010   CYSTOSCOPY WITH RETROGRADE PYELOGRAM, URETEROSCOPY AND STENT PLACEMENT Right 10/04/2013   Procedure: CYSTOSCOPY WITH RETROGRADE PYELOGRAM,  AND STENT PLACEMENT;  Surgeon: Jerilee Field, MD;  Location: [facility];  Service: Urology;  Laterality: Right;   DENTAL SURGERY N/A    MOLE REMOVAL  1958   chin   ROTATOR CUFF REPAIR Left 04/1998   with bone spur removed, Dr Madelon Lips   TONSILLECTOMY  1945   TRANSURETHRAL RESECTION OF PROSTATE N/A 09/24/2013   Procedure: TRANSURETHRAL RESECTION OF THE PROSTATE (TURP) WITH GYRUS (STAGED RIGHT LATERAL AND MEDIAN LOBE);  Surgeon: Kathi Ludwig, MD;  Location: [facility];  Service: Urology;  Laterality: N/A;   UPPER GI ENDOSCOPY      Social History   Socioeconomic History   Marital status: Married    Spouse name: Not on file   Number of children: 0   Years of education: Not on file   Highest education level: Not on file  Occupational History   Occupation: Network engineer  Tobacco Use   Smoking status: Never    Passive exposure: Never   Smokeless tobacco: Never  Vaping Use   Vaping status: Never Used  Substance and Sexual Activity   Alcohol use: No   Drug use: No   Sexual activity: Not Currently  Other Topics Concern   Not on file  Social History Narrative   Not on file   Social  Determinants of Health   Financial Resource Strain: Not on file  Food Insecurity: Food Insecurity Present (04/03/2022)   Hunger Vital Sign    Worried About Running Out of Food in the Last Year: Sometimes true    Ran Out of Food in the Last Year: Sometimes true  Transportation Needs: No Transportation Needs (04/13/2022)   PRAPARE - Administrator, Civil Service (Medical): No    Lack of Transportation (Non-Medical): No  Physical Activity: Not on file  Stress: Not on file  Social Connections: Not on file  Intimate Partner Violence: Not on file    Family History  Problem Relation Age  of Onset   Stroke Mother    Breast cancer Mother    Cirrhosis Mother        wine   Rheumatic fever Mother    Kyphosis Mother    Hypertension Father    Stroke Father    Heart disease Father    Heart disease Paternal Grandfather    Colon cancer Neg Hx     ROS: no fevers or chills, productive cough, hemoptysis, dysphasia, odynophagia, melena, hematochezia, dysuria, hematuria, rash, seizure activity, orthopnea, PND, pedal edema, claudication. Remaining systems are negative.  Physical Exam: Well-developed well-nourished in no acute distress.  Skin is warm and dry.  HEENT is normal.  Neck is supple.  Chest is clear to auscultation with normal expansion.  Cardiovascular exam is regular rate and rhythm.  Abdominal exam nontender or distended. No masses palpated. Extremities show no edema. neuro grossly intact  ECG- personally reviewed  A/P  1 paroxysmal atrial fibrillation-patient is in sinus rhythm today.  Continue amiodarone and apixaban.  2 hypertension-blood pressure controlled.  Continue present medical regimen.  3 hyperlipidemia-continue statin.  4 chronic combined systolic/diastolic congestive heart failure-continue losartan, beta-blocker, Lasix and Jardiance.  Patient appears to be euvolemic.  5 coronary artery disease-patient is status post coronary bypass and graft.  He is  not having chest pain.  Continue statin.  No aspirin given need for anticoagulation.  6 history of PVCs-continue amiodarone and mexiletine.  Olga Millers, MD

## 2022-12-18 NOTE — Patient Instructions (Signed)
Visit Information  Thank you for taking time to visit with me today. Please don't hesitate to contact me if I can be of assistance to you.   Following are the goals we discussed today:   Goals Addressed             This Visit's Progress    To have no more falls       Care Coordination Interventions: Reviewed medications and discussed potential side effects of medications such as dizziness and frequent urination Advised patient of importance of notifying provider of falls Assessed for falls since last encounter Assessed patients knowledge of fall risk prevention secondary to previously provided education Assessed social determinant of health barriers SW referral sent to Bevelyn Ngo BSW to assist with resources to help pay utilities and obtain food       To maintain muscle mass by increasing protein       Care Coordination Interventions: Evaluation of current treatment plan related to Nutritional Imbalance and patient's adherence to plan as established by provider Discussed and reviewed with patient, his PCP prescribed Mirtazapine for appetite stimulant, patient states his appetite immediately improved following his first dosage  Educated patient on the indication, usage and dosage of Mirtazapine, instructed patient to take this medication in the evening  30 minutes to 1 hour before bedtime  Discussed with patient he continues to drink Ensure as supplemental protein, reports his weight has improved Determined patient completed a f/u with GI for evaluation of his GERD Review of patient status, including review of consultant's reports, relevant laboratory and other test results, and medications completed Body mass index is 21.63 kg/m.    Wt Readings from Last 3 Encounters:  12/14/22 118 lb (53.6 kg)           Our next appointment is by telephone on 01/16/23 at 2:00 PM  Please call the care guide team at [phone] if you need to cancel or reschedule your appointment.   If  you are experiencing a Mental Health or Behavioral Health Crisis or need someone to talk to, please call 1-800-273-TALK (toll free, 24 hour hotline)  Patient verbalizes understanding of instructions and care plan provided today and agrees to view in MyChart. Active MyChart status and patient understanding of how to access instructions and care plan via MyChart confirmed with patient.     Delsa Sale, RN, BSN, CCM Care Management Coordinator [facility]  Direct Phone: [phone]

## 2022-12-18 NOTE — Patient Outreach (Signed)
Care Coordination   Follow Up Visit Note   12/17/2022 Name: Brent Griffith MRN: [id number] DOB: Nov 22, 1935  Brent Griffith is a 87 y.o. year old male who sees Brent Griffith, Brent Harvey, NP for primary care. I spoke with  Brent Griffith by phone today.  What matters to the patients health and wellness today?  Patient would like to get some help with obtaining food and getting his utilities paid.     Goals Addressed             This Visit's Progress    To have no more falls       Care Coordination Interventions: Reviewed medications and discussed potential side effects of medications such as dizziness and frequent urination Advised patient of importance of notifying provider of falls Assessed for falls since last encounter Assessed patients knowledge of fall risk prevention secondary to previously provided education Assessed social determinant of health barriers SW referral sent to Brent Griffith BSW to assist with resources to help pay utilities and obtain food      To maintain muscle mass by increasing protein       Care Coordination Interventions: Evaluation of current treatment plan related to Nutritional Imbalance and patient's adherence to plan as established by provider Discussed and reviewed with patient, his PCP prescribed Mirtazapine for appetite stimulant, patient states his appetite immediately improved following his first dosage  Educated patient on the indication, usage and dosage of Mirtazapine, instructed patient to take this medication in the evening  30 minutes to 1 hour before bedtime  Discussed with patient he continues to drink Ensure as supplemental protein, reports his weight has improved Determined patient completed a f/u with GI for evaluation of his GERD Review of patient status, including review of consultant's reports, relevant laboratory and other test results, and medications completed Body mass index is 21.63 kg/m.    Wt Readings from Last 3 Encounters:   12/14/22 118 lb (53.6 kg)      Interventions Today    Flowsheet Row Most Recent Value  Chronic Disease   Chronic disease during today's visit Other  [GERD,  back pain,  abnormal weight loss]  General Interventions   General Interventions Discussed/Reviewed General Interventions Discussed, General Interventions Reviewed, Doctor Visits  Doctor Visits Discussed/Reviewed Doctor Visits Discussed, Doctor Visits Reviewed, PCP, Specialist  Exercise Interventions   Exercise Discussed/Reviewed Assistive device use and maintanence, Physical Activity, Exercise Reviewed, Exercise Discussed  Physical Activity Discussed/Reviewed Physical Activity Reviewed, Physical Activity Discussed  Education Interventions   Education Provided Provided Education  Provided Verbal Education On Nutrition, When to see the doctor, Medication  Nutrition Interventions   Nutrition Discussed/Reviewed Nutrition Discussed, Nutrition Reviewed, Increasing proteins  Pharmacy Interventions   Pharmacy Dicussed/Reviewed Pharmacy Topics Discussed, Pharmacy Topics Reviewed, Medications and their functions  Safety Interventions   Safety Discussed/Reviewed Fall Risk, Safety Reviewed, Safety Discussed  Home Safety Assistive Devices          SDOH assessments and interventions completed:  Yes  SDOH Interventions Today    Flowsheet Row Most Recent Value  SDOH Interventions   Food Insecurity Interventions AMB Referral  [referral sent to Brent Griffith BSW]  Transportation Interventions Intervention Not Indicated  Utilities Interventions AMB Referral  [referral sent to Brent Griffith BSW]  Physical Activity Interventions Intervention Not Indicated        Care Coordination Interventions:  Yes, provided   Follow up plan: Referral made to Brent Griffith BSW to assist with food insecurity and resources to help  with utilities  Follow up call scheduled for 01/16/23 @2 :00 PM    Encounter Outcome:  Pt. Visit Completed

## 2022-12-19 ENCOUNTER — Ambulatory Visit: Payer: Medicare Other | Attending: Nurse Practitioner | Admitting: Physical Therapy

## 2022-12-19 DIAGNOSIS — M6281 Muscle weakness (generalized): Secondary | ICD-10-CM | POA: Diagnosis not present

## 2022-12-19 DIAGNOSIS — R293 Abnormal posture: Secondary | ICD-10-CM | POA: Insufficient documentation

## 2022-12-19 DIAGNOSIS — R296 Repeated falls: Secondary | ICD-10-CM | POA: Insufficient documentation

## 2022-12-19 NOTE — Therapy (Signed)
OUTPATIENT PHYSICAL THERAPY NEURO EVALUATION   Patient Name: Brent Griffith MRN: [id number] DOB:12/24/1935, 87 y.o., male Today's Date: 12/19/2022   PCP: [name] REFERRING PROVIDER: [name]  END OF SESSION:  PT End of Session - 12/19/22 1359     Visit Number 2    Date for PT Re-Evaluation 01/15/23    Authorization Type [employer] Medicare    PT Start Time 1400    PT Stop Time 1440    PT Time Calculation (min) 40 min    Activity Tolerance Patient tolerated treatment well             Past Medical History:  Diagnosis Date   Acute gastric ulcer without mention of hemorrhage, perforation, or obstruction    Allergic rhinitis, cause unspecified    Anemia, unspecified    Arthritis    Asthma    Blood transfusion without reported diagnosis    CAD (coronary artery disease)    Diaphragmatic hernia without mention of obstruction or gangrene    Elevated prostate specific antigen (PSA)    Enlarged prostate    Esophageal reflux    Extrinsic asthma, unspecified    Herpes zoster without mention of complication    Hyperlipidemia    Hypersomnia with sleep apnea, unspecified    Impotence of organic origin    Kyphosis    Lumbago    Obstructive sleep apnea (adult) (pediatric)    Pneumonia    hx of years ago    Senile osteoporosis    Trigger finger (acquired)    Tubular adenoma of colon 05/2013   Unspecified essential hypertension    Unspecified sleep apnea    CPAP- settings 4-12    Unspecified vitamin D deficiency    Past Surgical History:  Procedure Laterality Date   CARDIAC CATHETERIZATION  03/04/2009   Dr Dorris Fetch   COLONOSCOPY     CORONARY ARTERY BYPASS GRAFT  2010   CYSTOSCOPY WITH RETROGRADE PYELOGRAM, URETEROSCOPY AND STENT PLACEMENT Right 10/04/2013   Procedure: CYSTOSCOPY WITH RETROGRADE PYELOGRAM,  AND STENT PLACEMENT;  Surgeon: Jerilee Field, MD;  Location: [facility];  Service: Urology;  Laterality: Right;   DENTAL SURGERY N/A    MOLE REMOVAL   1958   chin   ROTATOR CUFF REPAIR Left 04/1998   with bone spur removed, Dr Madelon Lips   TONSILLECTOMY  1945   TRANSURETHRAL RESECTION OF PROSTATE N/A 09/24/2013   Procedure: TRANSURETHRAL RESECTION OF THE PROSTATE (TURP) WITH GYRUS (STAGED RIGHT LATERAL AND MEDIAN LOBE);  Surgeon: Kathi Ludwig, MD;  Location: [facility];  Service: Urology;  Laterality: N/A;   UPPER GI ENDOSCOPY     Patient Active Problem List   Diagnosis Date Noted   CKD (chronic kidney disease) stage 3, GFR 30-59 ml/min (HCC) 07/06/2022   Encounter for therapeutic drug monitoring 10/25/2021   Atrial fibrillation (HCC) 10/25/2021   PVC's (premature ventricular contractions) 05/04/2021   COVID-19 virus infection 02/20/2021   Sinusitis 01/24/2021   New onset atrial fibrillation (HCC) 01/19/2021   AKI (acute kidney injury) (HCC) 01/19/2021   HFrEF (heart failure with reduced ejection fraction) (HCC) 01/19/2021   CAD (coronary artery disease) 10/11/2017   IDA (iron deficiency anemia) 08/31/2016   Seasonal and perennial allergic rhinitis 08/28/2016   Palpitations 09/07/2015   Cough, persistent 07/20/2015   Senile osteoporosis 06/09/2014   Shortness of breath 03/20/2014   Hydronephrosis of right kidney 10/03/2013   Benign prostatic hyperplasia 09/24/2013   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   Dyslipidemia 04/06/2009  CARDIOVASCULAR FUNCTION STUDY, ABNORMAL 03/03/2009   Obstructive sleep apnea 02/04/2009   Essential hypertension 09/02/2008   GERD (gastroesophageal reflux disease) 09/02/2008   PERIODIC LIMB MOVEMENT DISORDER 09/03/2007    ONSET DATE: 6 months  REFERRING DIAG:  W19.XXXD (ICD-10-CM) - Fall, subsequent encounter  M40.203 (ICD-10-CM) - Kyphosis of cervicothoracic region, unspecified kyphosis type    THERAPY DIAG:  Frequent falls;   Rationale for Evaluation and Treatment: Rehabilitation  SUBJECTIVE:                                                                                                                                                                                              SUBJECTIVE STATEMENT: Patient presents wearing a back brace. States it hurts but has misplaced the chin strap. Reflux has gone away.  My weight is 113#.  Patient taking an appetite stimulant.  I'm a little weaker today.  Tripped on the threshold of the door at home.    Eval:I've been diagnosed with gastric reflux.  I've lost weight and now I'm down to 107#.  I've gotten weak.  Recently rolled off of the bed recently. Falls are trips mostly.  Tripped [place] curb at church and hit head.  CT scan normal.  Hit head another time as well when tripped on sheet hanging off the side of the bed.  Had a tree fall and used a saw to cut branches and caused some shoulder/back pain.  C4 CT scan had not changed.  Fatigue more than pain. Gets back brace tomorrow from [employer] in [place] Phone shut off (unable to pay the bill)  Pt accompanied by: significant other [name] in lobby  PERTINENT HISTORY: cardiac disease; osteoporosis; HTN; heart failure; kidney disease Phone shut off (unable to pay the bill) Food insecurity PAIN:  PAIN:  Are you having pain? A little bit in my neck NPRS scale: 1-2/10 Pain location: history of neck/back pain   PRECAUTIONS: Fall     WEIGHT BEARING RESTRICTIONS: No  FALLS: Has patient fallen in last 6 months? Yes. Number of falls 2 but pt able to recount more than 2 falls in subjective  LIVING ENVIRONMENT: Lives with: lives with their spouse Lives in: House/apartment    PLOF: Independent  PATIENT GOALS: get stronger to do things around the house (put up ceiling fan in living room);  I'm worried about falling  (forward direction)  OBJECTIVE:   DIAGNOSTIC FINDINGS: CT scans: head, neck, back normal after falls  COGNITION: Overall cognitive status: Within functional limits for tasks assessed   POSTURE: very rounded shoulders, forward head, and increased thoracic kyphosis  LOWER EXTREMITY ROM:  WFLs   LOWER EXTREMITY MMT:  grossly 4-/5 (unable to rise from the chair without UE support)  GAIT: Comments: decreased stride length, dec gait speed  FUNCTIONAL TESTS:  Unable to rise without UE assist; 5x sit to stand with UE use mod 18.20 TUG with hands 18.20 unsteady upon return and sits on corner of chair  PATIENT SURVEYS:  ABC scale 80% on 14 questions (eliminated carrying packages on escalator and walking on icy sidewalks)pt notes he needs UE support for reaching to the floor and reaching overhead)  MINI-BESTest: Balance Evaluation Systems Test (partial components) ANTICIPATORY: SIT TO STAND: (2) normal without use of hands and stabilizes independently     (1) Moderate comes to stand WITH use of hands on 1st attempt    (0) Severe: unable to stand up from chair without assistance OR needs several attempts with use of   hands Score: 0   RISE TO TOES: feet shoulder width apart. Hands on hips.  Rise as high as you can onto your toes. Try to hold this pose for 3 sec                          (2) stable for 3s with maximum height    (1) heels up, but not full range (smaller than when holding hands) OR noticeable instability for 3 sec                           (0) < or equalto 3 s  Score: 0   STAND ON ONE LEG: look straight ahead. Hands on hips. Lift your leg off the ground.     Left:  trial 1 _2___ trial 2___2_                                    Right: Trial 1:___2__   Trial 2:____2___   (2) 20 s       (2)  20 s                (1) < 20      (1) < 20 s   (0) Unable      (0) unable Score (use the worst side): 1   SENSORY ORIENTATION STANCE FEET TOGETHER EYES OPEN  firm surface Be as stable and still as possible until I say stop   (2) 30s   (1) < 30 s   (0) unable Score: 1 pt did well for 20 sec then sat down quickly in chair    STANCE ON FOAM EYES CLOSED feet together, eyes closed, hands on hips. Be as stable and still as you can until I stay stop.  I will start timing when  you close your eyes. Time in seconds:      (2) 30s   (1) <30 s   (0) unable Score: 0 unable to attempt   INCLINE EYES CLOSED: Stand on incline with feet shoulder width apart, arms down at your sides.  I will start timing when you close your eyes   (2) Stands 30 s and aligns with gravity   (1) stands < 30 s OR aligns with surface   (0) unable Score:  0   TIMED UP AND GO NORMAL AND WITH DUAL TASK: 3 METER WALK: When I say go walk at your your normal speed across the tape, turn around  and come back to sit in the chair   Time:  18.20 Count backwards by 3s starting at    20.  When I say go, stand up from the chair, walk at your normal speed across the tape on the floor, turn around, come back to sit in the chair.  Continue counting backwards the entire time.  Time:   (2) No noticeable change in sitting, standing or walking while backward counting when compared to TUG without dual task   (1) Dual task affects either counting OR walking (>10%)    (0) stops counting while walking OR stops walking while counting If gait speed slows more than 10% between TUG without and with dual task, the score should be decreased by a point Score: 0 TUG: 24.13 sec dual task     8/14:Short Physical Performance Battery:    Balance:       Side by side stance:  1  points (1)  Semi -tandem:  0   points (1)  Tandem:    0  points (2)    Gait Speed: (13.1 feet) done 2x take the best time:   7.59 sec =2   points                                           > 8.70 sec=1 pt                                           6.21- 8.70 sec=2 pt                                           4.82-6.20 sec=3 pt                                            < 4.82 = 4 pts    Repeated Chair Stands:   20.62 sec with arms 0   points   (timer stopped when straight on 5th stand)                                              If used arms=0 pts                                              > 60 sec=0 pts                                               16.70 or more=1 pt  13.70-16.69=2 pts                                               11.20-13.69=3 pts                                               11.9 sec or less=4 pts Total score=    3  points <10/12 predictive of 1 or more mobility limitations and increased risk of mobility disability 6 or less/12 associated with a higher fall rate                                                 5x sit to stand test with UE use: 23.38                                                TODAY'S TREATMENT:                                                                                                                              DATE:8/14: SPPB Sit to stand chair + cushion no hands 2 sets of 5 Heel raises 10x at the counter 2 hand support Alternating step taps 10x 2 hand support Forward step over yard stick on floor to simulate clearing door thresholds single arm support 10x each side (challenging) Counter top push ups 10x Bent at the counter top rows 10x right/left  Standing 2# overhead press single arm 10x right/left  Standing 2# in each hand alternating bicep curls 10x  High stepping, backwards walk, sidestepping next to the counter (no UE assist on the counter needed) Patient given community resources handout    PATIENT EDUCATION: Education details: discussion of plan of care. Community resources handout Person educated: Patient Education method: Explanation Education comprehension: verbalized understanding  HOME EXERCISE PROGRAM: Access Code: [email or web address] URL: [email or web address] Date: 12/19/2022 Prepared by: Lavinia Sharps  Exercises - Sit to Stand with Arms Crossed  - 1 x daily - 7 x weekly - 2 sets - 5 reps - Heel Raises with Counter Support  - 1 x daily - 7 x weekly - 1 sets - 10 reps - Alternating Step Taps with Counter Support  - 1 x daily - 7 x weekly - 1 sets - 10 reps - Forward Step Over with Counter Support  - 1 x daily  - 7 x weekly - 1 sets - 10 reps - Push-Up on Counter  -  1 x daily - 7 x weekly - 1 sets - 10 reps - Standing Bent Over Single Arm Scapular Row with Table Support with PLB  - 1 x daily - 7 x weekly - 1 sets - 10 reps  GOALS: Goals reviewed with patient? Yes  SHORT TERM GOALS: Target date: 12/18/2022  The patient will demonstrate knowledge of basic self care strategies and exercises to promote strength and balance Baseline: Goal status: INITIAL  2.  Improved LE strength needed to rise from a chair with greater ease; 5x STS in 16.5 sec Baseline:  Goal status: INITIAL  3.  The patient will have improved Timed Up and Go (TUG) time to    16.5   sec indicating improved gait speed and LE strength  Baseline:  Goal status: INITIAL  4.  Short Physical Physical Battery score improved by 1 point indicating reduced frailty Baseline:  Goal status: INITIAL     LONG TERM GOALS: Target date: 01/15/2023   The patient will be independent in a safe self progression of a home exercise program to promote further recovery of function  Baseline:  Goal status: INITIAL  2.  The patient will have improved LE strength and balance to squat to pick up an object from the floor or reach overhead with holding onto something Baseline:  Goal status: INITIAL  3.  The patient will have improved Timed Up and Go (TUG) time to  15     sec indicating improved gait speed and LE strength  Baseline:  Goal status: INITIAL  4.  Short Physical Performance Battery score improved by 2 points indicating decreased risk of falls and improved strength Baseline:  Goal status: INITIAL  5.  Activities specific Balance Confidence scale improved to 85 (14 items rather 16) Baseline:  Goal status: INITIAL    ASSESSMENT:  CLINICAL IMPRESSION: The patient is at high risk of falls and mobility limitation and disability per SPPB test.  The patient indicates he's been eating better which should help with efforts to build  strength.  Able to initiate strengthening and basic balance HEP in standing.  Therapist monitoring fatigue level (rating of perceived exertion is "low") and providing verbal cues for forward gaze.  Extreme kyphotic posture also impacts balance.  STGs not met secondary to longer duration of time between eval and this visit.    OBJECTIVE IMPAIRMENTS: decreased activity tolerance, decreased balance, decreased endurance, decreased mobility, difficulty walking, decreased strength, impaired perceived functional ability, impaired UE functional use, and postural dysfunction.   ACTIVITY LIMITATIONS: carrying, lifting, bending, standing, squatting, stairs, reach over head, hygiene/grooming, locomotion level, and caring for others  PARTICIPATION LIMITATIONS: meal prep, cleaning, laundry, interpersonal relationship, driving, shopping, community activity, and yard work  PERSONAL FACTORS: Social background, Time since onset of injury/illness/exacerbation, and 3+ comorbidities: cardiac, HTN, kidney disease  are also affecting patient's functional outcome.  Food and financial insecurity  REHAB POTENTIAL: Good  CLINICAL DECISION MAKING: Evolving/moderate complexity  EVALUATION COMPLEXITY: Moderate  PLAN:  PT FREQUENCY: 2x/week  PT DURATION: 8 weeks  PLANNED INTERVENTIONS: Therapeutic exercises, Therapeutic activity, Neuromuscular re-education, Balance training, Gait training, Patient/Family education, Self Care, Cryotherapy, Moist heat, Manual therapy, and Re-evaluation  PLAN FOR NEXT SESSION:   general strengthening, low level balance; sit to stand; clearing thresholds, reaching/bending down; overhead reaching; try Clock Yourself app  Lavinia Sharps, PT 12/19/22 2:58 PM Phone: [phone] Fax: [phone]

## 2022-12-19 NOTE — Telephone Encounter (Signed)
Spoke with patient on 12/17/22 Patient returned call and he stated that he did not mean to say atenolol,but he needed rosuvastatin.

## 2022-12-21 ENCOUNTER — Encounter: Payer: Self-pay | Admitting: Cardiology

## 2022-12-21 ENCOUNTER — Ambulatory Visit: Payer: Medicare Other | Admitting: Cardiology

## 2022-12-21 ENCOUNTER — Telehealth: Payer: Self-pay

## 2022-12-21 NOTE — Patient Outreach (Signed)
  Care Coordination   12/21/2022 Name: Brent Griffith MRN: [id number] DOB: Sep 04, 1935   Care Coordination Outreach Attempts:  An unsuccessful telephone outreach was attempted today to offer the patient information about available care coordination services.  Follow Up Plan:  Additional outreach attempts will be made to offer the patient care coordination information and services.   Encounter Outcome:  No Answer   Care Coordination Interventions:  No, not indicated    Bevelyn Ngo, BSW, CDP Social Worker, Certified Dementia Practitioner [facility] Care Management  Care Coordination [phone]

## 2022-12-25 ENCOUNTER — Ambulatory Visit: Payer: Medicare Other | Admitting: Physical Therapy

## 2022-12-27 ENCOUNTER — Ambulatory Visit: Payer: Medicare Other | Admitting: Physical Therapy

## 2022-12-27 ENCOUNTER — Encounter (HOSPITAL_BASED_OUTPATIENT_CLINIC_OR_DEPARTMENT_OTHER): Payer: Self-pay | Admitting: Urology

## 2022-12-27 ENCOUNTER — Encounter: Payer: Self-pay | Admitting: Nurse Practitioner

## 2022-12-27 ENCOUNTER — Other Ambulatory Visit: Payer: Self-pay

## 2022-12-27 ENCOUNTER — Emergency Department (HOSPITAL_BASED_OUTPATIENT_CLINIC_OR_DEPARTMENT_OTHER)
Admission: EM | Admit: 2022-12-27 | Discharge: 2022-12-27 | Disposition: A | Discharge status: Home / Self Care | Payer: Medicare Other | Attending: Emergency Medicine | Admitting: Emergency Medicine

## 2022-12-27 ENCOUNTER — Encounter: Payer: Self-pay | Admitting: Cardiology

## 2022-12-27 DIAGNOSIS — R296 Repeated falls: Secondary | ICD-10-CM | POA: Diagnosis not present

## 2022-12-27 DIAGNOSIS — X58XXXA Exposure to other specified factors, initial encounter: Secondary | ICD-10-CM | POA: Diagnosis not present

## 2022-12-27 DIAGNOSIS — Z7901 Long term (current) use of anticoagulants: Secondary | ICD-10-CM | POA: Diagnosis not present

## 2022-12-27 DIAGNOSIS — M6281 Muscle weakness (generalized): Secondary | ICD-10-CM

## 2022-12-27 DIAGNOSIS — S51812A Laceration without foreign body of left forearm, initial encounter: Secondary | ICD-10-CM | POA: Insufficient documentation

## 2022-12-27 DIAGNOSIS — S59912A Unspecified injury of left forearm, initial encounter: Secondary | ICD-10-CM | POA: Diagnosis present

## 2022-12-27 DIAGNOSIS — R293 Abnormal posture: Secondary | ICD-10-CM | POA: Diagnosis not present

## 2022-12-27 NOTE — Discharge Instructions (Addendum)
You were seen in the ER today for concerns of a skin tear. As this was not actively bleeding, there is no need to repair this with glue or stitches. Instead, I would suggest keeping the area dressed and following up with your primary care provider to ensure it is healing well.

## 2022-12-27 NOTE — Therapy (Signed)
OUTPATIENT PHYSICAL THERAPY NEURO EVALUATION   Patient Name: Brent Griffith MRN: [id number] DOB:[birth year], 87 y.o., male Today's Date: 12/27/2022   PCP: [name] REFERRING PROVIDER: [name]  END OF SESSION:  PT End of Session - 12/27/22 1352     Visit Number 3    Date for PT Re-Evaluation 01/15/23    Authorization Type [employer] Medicare    PT Start Time 1400    PT Stop Time 1440    PT Time Calculation (min) 40 min    Activity Tolerance Patient tolerated treatment well             Past Medical History:  Diagnosis Date   Acute gastric ulcer without mention of hemorrhage, perforation, or obstruction    Allergic rhinitis, cause unspecified    Anemia, unspecified    Arthritis    Asthma    Blood transfusion without reported diagnosis    CAD (coronary artery disease)    Diaphragmatic hernia without mention of obstruction or gangrene    Elevated prostate specific antigen (PSA)    Enlarged prostate    Esophageal reflux    Extrinsic asthma, unspecified    Herpes zoster without mention of complication    Hyperlipidemia    Hypersomnia with sleep apnea, unspecified    Impotence of organic origin    Kyphosis    Lumbago    Obstructive sleep apnea (adult) (pediatric)    Pneumonia    hx of years ago    Senile osteoporosis    Trigger finger (acquired)    Tubular adenoma of colon 05/2013   Unspecified essential hypertension    Unspecified sleep apnea    CPAP- settings 4-12    Unspecified vitamin D deficiency    Past Surgical History:  Procedure Laterality Date   CARDIAC CATHETERIZATION  03/04/2009   Dr Dorris Fetch   COLONOSCOPY     CORONARY ARTERY BYPASS GRAFT  2010   CYSTOSCOPY WITH RETROGRADE PYELOGRAM, URETEROSCOPY AND STENT PLACEMENT Right 10/04/2013   Procedure: CYSTOSCOPY WITH RETROGRADE PYELOGRAM,  AND STENT PLACEMENT;  Surgeon: Jerilee Field, MD;  Location: [facility];  Service: Urology;  Laterality: Right;   DENTAL SURGERY N/A    MOLE REMOVAL   1958   chin   ROTATOR CUFF REPAIR Left 04/1998   with bone spur removed, Dr Madelon Lips   TONSILLECTOMY  1945   TRANSURETHRAL RESECTION OF PROSTATE N/A 09/24/2013   Procedure: TRANSURETHRAL RESECTION OF THE PROSTATE (TURP) WITH GYRUS (STAGED RIGHT LATERAL AND MEDIAN LOBE);  Surgeon: Kathi Ludwig, MD;  Location: [facility];  Service: Urology;  Laterality: N/A;   UPPER GI ENDOSCOPY     Patient Active Problem List   Diagnosis Date Noted   CKD (chronic kidney disease) stage 3, GFR 30-59 ml/min (HCC) 07/06/2022   Encounter for therapeutic drug monitoring 10/25/2021   Atrial fibrillation (HCC) 10/25/2021   PVC's (premature ventricular contractions) 05/04/2021   COVID-19 virus infection 02/20/2021   Sinusitis 01/24/2021   New onset atrial fibrillation (HCC) 01/19/2021   AKI (acute kidney injury) (HCC) 01/19/2021   HFrEF (heart failure with reduced ejection fraction) (HCC) 01/19/2021   CAD (coronary artery disease) 10/11/2017   IDA (iron deficiency anemia) 08/31/2016   Seasonal and perennial allergic rhinitis 08/28/2016   Palpitations 09/07/2015   Cough, persistent 07/20/2015   Senile osteoporosis 06/09/2014   Shortness of breath 03/20/2014   Hydronephrosis of right kidney 10/03/2013   Benign prostatic hyperplasia 09/24/2013   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   Dyslipidemia 04/06/2009  CARDIOVASCULAR FUNCTION STUDY, ABNORMAL 03/03/2009   Obstructive sleep apnea 02/04/2009   Essential hypertension 09/02/2008   GERD (gastroesophageal reflux disease) 09/02/2008   PERIODIC LIMB MOVEMENT DISORDER 09/03/2007    ONSET DATE: 6 months  REFERRING DIAG:  W19.XXXD (ICD-10-CM) - Fall, subsequent encounter  M40.203 (ICD-10-CM) - Kyphosis of cervicothoracic region, unspecified kyphosis type    THERAPY DIAG:  Frequent falls;   Rationale for Evaluation and Treatment: Rehabilitation  SUBJECTIVE:                                                                                                                                                                                              SUBJECTIVE STATEMENT: Wearing neck apparatus to hold head in neutral.  No falls or close calls.  Our food supply is running low.    Eval:I've been diagnosed with gastric reflux.  I've lost weight and now I'm down to 107#.  I've gotten weak.  Recently rolled off of the bed recently. Falls are trips mostly.  Tripped up curb at church and hit head.  CT scan normal.  Hit head another time as well when tripped on sheet hanging off the side of the bed.  Had a tree fall and used a saw to cut branches and caused some shoulder/back pain.  C4 CT scan had not changed.  Fatigue more than pain. Gets back brace tomorrow from [employer] in [place] Phone shut off (unable to pay the bill)  Pt accompanied by: significant other [name] in lobby  PERTINENT HISTORY: cardiac disease; osteoporosis; HTN; heart failure; kidney disease Phone shut off (unable to pay the bill) Food insecurity PAIN:  PAIN:  Are you having pain? A little bit in my neck NPRS scale: 1-2/10 Pain location: history of neck/back pain   PRECAUTIONS: Fall     WEIGHT BEARING RESTRICTIONS: No  FALLS: Has patient fallen in last 6 months? Yes. Number of falls 2 but pt able to recount more than 2 falls in subjective  LIVING ENVIRONMENT: Lives with: lives with their spouse Lives in: House/apartment    PLOF: Independent  PATIENT GOALS: get stronger to do things around the house (put up ceiling fan in living room);  I'm worried about falling  (forward direction)  OBJECTIVE:   DIAGNOSTIC FINDINGS: CT scans: head, neck, back normal after falls  COGNITION: Overall cognitive status: Within functional limits for tasks assessed   POSTURE: very rounded shoulders, forward head, and increased thoracic kyphosis  LOWER EXTREMITY ROM:  WFLs   LOWER EXTREMITY MMT:  grossly 4-/5 (unable to rise from the chair without UE support)  GAIT: Comments: decreased stride  length, dec  gait speed  FUNCTIONAL TESTS:  Unable to rise without UE assist; 5x sit to stand with UE use mod 18.20 TUG with hands 18.20 unsteady upon return and sits on corner of chair  PATIENT SURVEYS:  ABC scale 80% on 14 questions (eliminated carrying packages on escalator and walking on icy sidewalks)pt notes he needs UE support for reaching to the floor and reaching overhead)  MINI-BESTest: Balance Evaluation Systems Test (partial components) ANTICIPATORY: SIT TO STAND: (2) normal without use of hands and stabilizes independently     (1) Moderate comes to stand WITH use of hands on 1st attempt    (0) Severe: unable to stand up from chair without assistance OR needs several attempts with use of   hands Score: 0   RISE TO TOES: feet shoulder width apart. Hands on hips.  Rise as high as you can onto your toes. Try to hold this pose for 3 sec                          (2) stable for 3s with maximum height    (1) heels up, but not full range (smaller than when holding hands) OR noticeable instability for 3 sec                           (0) < or equalto 3 s  Score: 0   STAND ON ONE LEG: look straight ahead. Hands on hips. Lift your leg off the ground.     Left:  trial 1 _2___ trial 2___2_                                    Right: Trial 1:___2__   Trial 2:____2___   (2) 20 s       (2)  20 s                (1) < 20      (1) < 20 s   (0) Unable      (0) unable Score (use the worst side): 1   SENSORY ORIENTATION STANCE FEET TOGETHER EYES OPEN  firm surface Be as stable and still as possible until I say stop   (2) 30s   (1) < 30 s   (0) unable Score: 1 pt did well for 20 sec then sat down quickly in chair    STANCE ON FOAM EYES CLOSED feet together, eyes closed, hands on hips. Be as stable and still as you can until I stay stop.  I will start timing when you close your eyes. Time in seconds:      (2) 30s   (1) <30 s   (0) unable Score: 0 unable to attempt   INCLINE EYES CLOSED:  Stand on incline with feet shoulder width apart, arms down at your sides.  I will start timing when you close your eyes   (2) Stands 30 s and aligns with gravity   (1) stands < 30 s OR aligns with surface   (0) unable Score:  0   TIMED UP AND GO NORMAL AND WITH DUAL TASK: 3 METER WALK: When I say go walk at your your normal speed across the tape, turn around and come back to sit in the chair   Time:  18.20 Count backwards by 3s starting at    20.  When  I say go, stand up from the chair, walk at your normal speed across the tape on the floor, turn around, come back to sit in the chair.  Continue counting backwards the entire time.  Time:   (2) No noticeable change in sitting, standing or walking while backward counting when compared to TUG without dual task   (1) Dual task affects either counting OR walking (>10%)    (0) stops counting while walking OR stops walking while counting If gait speed slows more than 10% between TUG without and with dual task, the score should be decreased by a point Score: 0 TUG: 24.13 sec dual task     8/14:Short Physical Performance Battery:    Balance:       Side by side stance:  1  points (1)  Semi -tandem:  0   points (1)  Tandem:    0  points (2)    Gait Speed: (13.1 feet) done 2x take the best time:   7.59 sec =2   points                                           > 8.70 sec=1 pt                                           6.21- 8.70 sec=2 pt                                           4.82-6.20 sec=3 pt                                            < 4.82 = 4 pts    Repeated Chair Stands:   20.62 sec with arms 0   points   (timer stopped when straight on 5th stand)                                              If used arms=0 pts                                              > 60 sec=0 pts                                              16.70 or more=1 pt                                               13.70-16.69=2 pts  11.20-13.69=3 pts                                               11.9 sec or less=4 pts Total score=    3  points <10/12 predictive of 1 or more mobility limitations and increased risk of mobility disability 6 or less/12 associated with a higher fall rate                                                 5x sit to stand test with UE use: 23.38                                                TODAY'S TREATMENT:  DATE:8/22: Gave pt contact number for SW [employer] for financial strain Nu-Step L4 (green model) 6 min while discussing status Sit to stand mat table 2 sets of 5 (some compensatory push with backs of knees) Heel raises with overhead reach on cabinet 10x alternating hands Forward step over low cone on floor to simulate clearing door thresholds single arm support 10x each side (challenging) Counter top push ups 10x Bent at the counter top rows 5# KB 10x right/left  Standing 5# overhead press bil arm 10x right/left  High stepping, backwards walk, sidestepping next to the counter (light UE assist on the counter needed) Clock Yourself App: simple clock bil support 40 spm reports LE fatigue Seated 5# KB on knee up and over yardstick 10x right/left  Seated red power cord dead lifts 10x RPE 4/10                                                                                                                              DATE:8/14: SPPB Sit to stand chair + cushion no hands 2 sets of 5 Heel raises 10x at the counter 2 hand support Alternating step taps 10x 2 hand support Forward step over yard stick on floor to simulate clearing door thresholds single arm support 10x each side (challenging) Counter top push ups 10x Bent at the counter top rows 10x right/left  Standing 2# overhead press single arm 10x right/left  Standing 2# in each hand alternating bicep curls 10x  High stepping, backwards walk, sidestepping next to the counter (no UE assist on the counter needed) Patient given  community resources handout    PATIENT EDUCATION: Education details: discussion of plan of care. Community resources handout Person educated: Patient Education method: Explanation Education comprehension: verbalized understanding  HOME EXERCISE PROGRAM: Access Code: [email or web address] URL: [email or web address] Date: 12/19/2022 Prepared by: Lavinia Sharps  Exercises - Sit to Stand with Arms Crossed  - 1 x daily - 7 x weekly - 2 sets - 5 reps - Heel Raises with Counter Support  - 1 x daily - 7 x weekly - 1 sets - 10 reps - Alternating Step Taps with Counter Support  - 1 x daily - 7 x weekly - 1 sets - 10 reps - Forward Step Over with Counter Support  - 1 x daily - 7 x weekly - 1 sets - 10 reps - Push-Up on Counter  - 1 x daily - 7 x weekly - 1 sets - 10 reps - Standing Bent Over Single Arm Scapular Row with Table Support with PLB  - 1 x daily - 7 x weekly - 1 sets - 10 reps  GOALS: Goals reviewed with patient? Yes  SHORT TERM GOALS: Target date: 12/18/2022  The patient will demonstrate knowledge of basic self care strategies and exercises to promote strength and balance Baseline: Goal status: INITIAL  2.  Improved LE strength needed to rise from a chair with greater ease; 5x STS in 16.5 sec Baseline:  Goal status: INITIAL  3.  The patient will have improved Timed Up and Go (TUG) time to    16.5   sec indicating improved gait speed and LE strength  Baseline:  Goal status: INITIAL  4.  Short Physical Physical Battery score improved by 1 point indicating reduced frailty Baseline:  Goal status: INITIAL     LONG TERM GOALS: Target date: 01/15/2023   The patient will be independent in a safe self progression of a home exercise program to promote further recovery of function  Baseline:  Goal status: INITIAL  2.  The patient will have improved LE strength and balance to squat to pick up an object from the floor or reach overhead with holding onto something Baseline:   Goal status: INITIAL  3.  The patient will have improved Timed Up and Go (TUG) time to  15     sec indicating improved gait speed and LE strength  Baseline:  Goal status: INITIAL  4.  Short Physical Performance Battery score improved by 2 points indicating decreased risk of falls and improved strength Baseline:  Goal status: INITIAL  5.  Activities specific Balance Confidence scale improved to 85 (14 items rather 16) Baseline:  Goal status: INITIAL    ASSESSMENT:  CLINICAL IMPRESSION: Upper extremity support needed with dynamic balance challenges.  Some LE fatigue reported toward end of session therefore modified with seated strengthening.  Food insecurity due to financial strain affects energy level and ability to strengthen muscles.  Patient given contact info for Child psychotherapist.    OBJECTIVE IMPAIRMENTS: decreased activity tolerance, decreased balance, decreased endurance, decreased mobility, difficulty walking, decreased strength, impaired perceived functional ability, impaired UE functional use, and postural dysfunction.   ACTIVITY LIMITATIONS: carrying, lifting, bending, standing, squatting, stairs, reach over head, hygiene/grooming, locomotion level, and caring for others  PARTICIPATION LIMITATIONS: meal prep, cleaning, laundry, interpersonal relationship, driving, shopping, community activity, and yard work  PERSONAL FACTORS: Social background, Time since onset of injury/illness/exacerbation, and 3+ comorbidities: cardiac, HTN, kidney disease  are also affecting patient's functional outcome.  Food and financial insecurity  REHAB POTENTIAL: Good  CLINICAL DECISION MAKING: Evolving/moderate complexity  EVALUATION COMPLEXITY: Moderate  PLAN:  PT FREQUENCY: 2x/week  PT DURATION: 8 weeks  PLANNED INTERVENTIONS: Therapeutic exercises, Therapeutic activity, Neuromuscular re-education, Balance training, Gait training, Patient/Family education, Self Care, Cryotherapy, Moist  heat, Manual therapy, and  Re-evaluation  PLAN FOR NEXT SESSION:   general strengthening, low level balance; sit to stand; clearing thresholds, reaching/bending down; overhead reaching;  Clock Yourself app 40 spm or slower if no support  Lavinia Sharps, PT 12/27/22 2:40 PM Phone: [phone] Fax: [phone]

## 2022-12-27 NOTE — ED Triage Notes (Signed)
Woke up this am with small skin tears to left arm, no bleeding at this time   Takes coumadin, has appointment with coumadin clinic tomorrow

## 2022-12-27 NOTE — ED Notes (Signed)
Wound dressed, d/c papers given, no questions or concerns per pt.

## 2022-12-27 NOTE — ED Provider Notes (Incomplete)
[place] EMERGENCY DEPARTMENT AT [facility] [place] Provider Note   CSN: [id number] Arrival date & time: 12/27/22  1925     History Chief Complaint  Patient presents with  . Skin Tear     Brent Griffith is a 87 y.o. male.  Patient presents emergency department concerns of a skin tear.  Reports that he sustained a skin tear when he was sleeping and woke up and noticed the bleeding.  Leading to stop prior to arriving here in the emergency department.  HPI     Home Medications Prior to Admission medications   Medication Sig Start Date End Date Taking? Authorizing Provider  albuterol (VENTOLIN HFA) 108 (90 Base) MCG/ACT inhaler Inhale 1-2 puffs into the lungs every 6 (six) hours as needed for wheezing or shortness of breath. 07/03/22   Fargo, Amy E, NP  alum & mag hydroxide-simeth (MAALOX MAX) 400-400-40 MG/5ML suspension Take 10 mLs by mouth every 6 (six) hours as needed (pain with swallowing and reflux). 10/21/22   Mardene Sayer, MD  amiodarone (PACERONE) 200 MG tablet TAKE 1 TABLET BY MOUTH DAILY 11/01/22   Lanier Prude, MD  Calcium Citrate-Vitamin D (EQ CALCIUM CITRATE+D3 PO) Take 1,200 mg by mouth daily.    [provider]  carvedilol (COREG) 3.125 MG tablet Take 1 tablet (3.125 mg total) by mouth 2 (two) times daily. Please keep scheduled appointment with cardiologist 05/25/22   Lewayne Bunting, MD  Cholecalciferol (VITAMIN D-3) 5000 units TABS Take 10,000 Units by mouth daily.    [provider]  Coenzyme Q10 (COQ10) 200 MG CAPS Take 200 mg by mouth daily.    [provider]  doxazosin (CARDURA) 2 MG tablet TAKE 1 TABLET BY MOUTH DAILY 12/07/22   Lewayne Bunting, MD  empagliflozin (JARDIANCE) 10 MG TABS tablet Take 1 tablet (10 mg total) by mouth daily before breakfast. 11/30/22   Lewayne Bunting, MD  famotidine (PEPCID) 20 MG tablet Take 1 tablet (20 mg total) by mouth 2 (two) times daily. 10/21/22 12/14/22  Mardene Sayer, MD   fexofenadine (ALLEGRA) 180 MG tablet Take 180 mg by mouth daily as needed for allergies.    [provider]  finasteride (PROSCAR) 5 MG tablet TAKE 1 TABLET BY MOUTH IN  THE MORNING 10/06/21   Sharon Seller, NP  furosemide (LASIX) 20 MG tablet TAKE 1 TABLET BY MOUTH DAILY AS  NEEDED FOR LEG SWELLING 05/01/22   Lewayne Bunting, MD  HYDROcodone-acetaminophen (NORCO/VICODIN) 5-325 MG tablet Take 1 tablet by mouth every 6 (six) hours as needed for moderate pain. 10/09/22   Vanetta Mulders, MD  Iron, Ferrous Sulfate, 325 (65 Fe) MG TABS Take 325 mg by mouth 2 (two) times daily. 09/13/21   Sharon Seller, NP  losartan (COZAAR) 25 MG tablet Take 1 tablet (25 mg total) by mouth daily. Please keep scheduled appointment with cardiologist 05/22/22   Lewayne Bunting, MD  mexiletine (MEXITIL) 150 MG capsule Take 1 capsule (150 mg total) by mouth 2 (two) times daily. 11/02/22   Lanier Prude, MD  mirtazapine (REMERON) 7.5 MG tablet Take 1 tablet (7.5 mg total) by mouth at bedtime. 11/16/22   Medina-Vargas, Monina C, NP  pantoprazole (PROTONIX) 40 MG tablet Take 1 tablet (40 mg total) by mouth daily. 12/17/22   Sharon Seller, NP  rosuvastatin (CRESTOR) 20 MG tablet TAKE 1 TABLET BY MOUTH ONCE DAILY FOR CHOLESTEROL 12/17/22   Sharon Seller, NP  warfarin (COUMADIN)  2.5 MG tablet TAKE 1/2 TO 1 TABLET BY MOUTH  DAILY AS DIRECTED BY THE  COUMADIN CLINIC 07/04/22   Lewayne Bunting, MD      Allergies    Compazine [prochlorperazine edisylate]    Review of Systems   Review of Systems  Physical Exam Updated Vital Signs BP (!) 142/75 (BP Location: Left Arm)   Pulse 60   Temp 97.8 F (36.6 C) (Oral)   Resp 16   Ht 5\' 2"  (1.575 m)   Wt 53.6 kg   SpO2 99%   BMI 21.61 kg/m  Physical Exam  ED Results / Procedures / Treatments   Labs (all labs ordered are listed, but only abnormal results are displayed) Labs Reviewed - No data to display  EKG None  Radiology No results  found.  Procedures Procedures  {Document cardiac monitor, telemetry assessment procedure when appropriate:1}  Medications Ordered in ED Medications - No data to display  ED Course/ Medical Decision Making/ A&P   {   Click here for ABCD2, HEART and other calculatorsREFRESH Note before signing :1}                              Medical Decision Making  ***  {Document critical care time when appropriate:1} {Document review of labs and clinical decision tools ie heart score, Chads2Vasc2 etc:1}  {Document your independent review of radiology images, and any outside records:1} {Document your discussion with family members, caretakers, and with consultants:1} {Document social determinants of health affecting pt's care:1} {Document your decision making why or why not admission, treatments were needed:1} Final Clinical Impression(s) / ED Diagnoses Final diagnoses:  None    Rx / DC Orders ED Discharge Orders     None

## 2022-12-27 NOTE — ED Provider Notes (Signed)
 [facility] Provider Note   CSN: [id number] Arrival date & time: 12/27/22  1925     History Chief Complaint  Patient presents with   Skin Tear     Brent Griffith is a 87 y.o. male.  Patient presents emergency department concerns of a skin tear.  Reports that he sustained a skin tear when he was sleeping and woke up and noticed the bleeding.  Leading to stop prior to arriving here in the emergency department.  HPI     Home Medications Prior to Admission medications   Medication Sig Start Date End Date Taking? Authorizing Provider  albuterol (VENTOLIN HFA) 108 (90 Base) MCG/ACT inhaler Inhale 1-2 puffs into the lungs every 6 (six) hours as needed for wheezing or shortness of breath. 07/03/22   Fargo, Amy E, NP  alum & mag hydroxide-simeth (MAALOX MAX) 400-400-40 MG/5ML suspension Take 10 mLs by mouth every 6 (six) hours as needed (pain with swallowing and reflux). 10/21/22   Mardene Sayer, MD  amiodarone (PACERONE) 200 MG tablet TAKE 1 TABLET BY MOUTH DAILY 11/01/22   Lanier Prude, MD  Calcium Citrate-Vitamin D (EQ CALCIUM CITRATE+D3 PO) Take 1,200 mg by mouth daily.    [provider]  carvedilol (COREG) 3.125 MG tablet Take 1 tablet (3.125 mg total) by mouth 2 (two) times daily. Please keep scheduled appointment with cardiologist 05/25/22   Lewayne Bunting, MD  Cholecalciferol (VITAMIN D-3) 5000 units TABS Take 10,000 Units by mouth daily.    [provider]  Coenzyme Q10 (COQ10) 200 MG CAPS Take 200 mg by mouth daily.    [provider]  doxazosin (CARDURA) 2 MG tablet TAKE 1 TABLET BY MOUTH DAILY 12/07/22   Lewayne Bunting, MD  empagliflozin (JARDIANCE) 10 MG TABS tablet Take 1 tablet (10 mg total) by mouth daily before breakfast. 11/30/22   Lewayne Bunting, MD  famotidine (PEPCID) 20 MG tablet Take 1 tablet (20 mg total) by mouth 2 (two) times daily. 10/21/22 12/14/22  Mardene Sayer, MD   fexofenadine (ALLEGRA) 180 MG tablet Take 180 mg by mouth daily as needed for allergies.    [provider]  finasteride (PROSCAR) 5 MG tablet TAKE 1 TABLET BY MOUTH IN  THE MORNING 10/06/21   Sharon Seller, NP  furosemide (LASIX) 20 MG tablet TAKE 1 TABLET BY MOUTH DAILY AS  NEEDED FOR LEG SWELLING 05/01/22   Lewayne Bunting, MD  HYDROcodone-acetaminophen (NORCO/VICODIN) 5-325 MG tablet Take 1 tablet by mouth every 6 (six) hours as needed for moderate pain. 10/09/22   Vanetta Mulders, MD  Iron, Ferrous Sulfate, 325 (65 Fe) MG TABS Take 325 mg by mouth 2 (two) times daily. 09/13/21   Sharon Seller, NP  losartan (COZAAR) 25 MG tablet Take 1 tablet (25 mg total) by mouth daily. Please keep scheduled appointment with cardiologist 05/22/22   Lewayne Bunting, MD  mexiletine (MEXITIL) 150 MG capsule Take 1 capsule (150 mg total) by mouth 2 (two) times daily. 11/02/22   Lanier Prude, MD  mirtazapine (REMERON) 7.5 MG tablet Take 1 tablet (7.5 mg total) by mouth at bedtime. 11/16/22   Medina-Vargas, Monina C, NP  pantoprazole (PROTONIX) 40 MG tablet Take 1 tablet (40 mg total) by mouth daily. 12/17/22   Sharon Seller, NP  rosuvastatin (CRESTOR) 20 MG tablet TAKE 1 TABLET BY MOUTH ONCE DAILY FOR CHOLESTEROL 12/17/22   Sharon Seller, NP  warfarin (COUMADIN)  2.5 MG tablet TAKE 1/2 TO 1 TABLET BY MOUTH  DAILY AS DIRECTED BY THE  COUMADIN CLINIC 07/04/22   Lewayne Bunting, MD      Allergies    Compazine [prochlorperazine edisylate]    Review of Systems   Review of Systems  Skin:  Positive for wound.  All other systems reviewed and are negative.   Physical Exam Updated Vital Signs BP (!) 142/75 (BP Location: Left Arm)   Pulse 60   Temp 97.8 F (36.6 C) (Oral)   Resp 16   Ht 5\' 2"  (1.575 m)   Wt 53.6 kg   SpO2 99%   BMI 21.61 kg/m  Physical Exam Vitals and nursing note reviewed.  Constitutional:      General: He is not in acute distress.    Appearance: He is  well-developed.  HENT:     Head: Normocephalic and atraumatic.  Eyes:     Conjunctiva/sclera: Conjunctivae normal.  Cardiovascular:     Rate and Rhythm: Normal rate and regular rhythm.     Heart sounds: No murmur heard. Pulmonary:     Effort: Pulmonary effort is normal. No respiratory distress.     Breath sounds: Normal breath sounds.  Abdominal:     Palpations: Abdomen is soft.     Tenderness: There is no abdominal tenderness.  Musculoskeletal:        General: No swelling.     Cervical back: Neck supple.  Skin:    General: Skin is warm and dry.     Capillary Refill: Capillary refill takes less than 2 seconds.     Findings: Lesion present.          Comments: Small area of skin tear on the left forearm. Not actively bleeding. No signs of infection.  Neurological:     Mental Status: He is alert.  Psychiatric:        Mood and Affect: Mood normal.     ED Results / Procedures / Treatments   Labs (all labs ordered are listed, but only abnormal results are displayed) Labs Reviewed - No data to display  EKG None  Radiology No results found.  Procedures Procedures   Medications Ordered in ED Medications - No data to display  ED Course/ Medical Decision Making/ A&P                               Medical Decision Making  This patient presents to the ED for concern of skin tear. Differential diagnosis includes skin tear, laceration, abrasion, cellulitis, abscess   Problem List / ED Course:  Patient presented to the ED with concerns of a skin tear. Reports that he noticed a skin tear on his right forearm this morning when getting out of bed. Concerned about wound due to being on blood thinner, however not actively bleeding. On examination, small laceration noted to the right forearm, all superficial in nature not amenable to repair and not actively bleeding. Given that patient is on coumadin, some concern about continued bleeding but no bleeding observed at all even after  cleaning of wound. Advised patient to follow up with coumadin clinic as planned for tomorrow. Low concern for more concerning wound or signs of infection. Advised patient to return to the ER if symptoms worsening. Patient denied any fall, head strike, syncope, or head trauma. Patient agreeable with treatment plan and verbalized understanding all return precautions. All questions answered prior to patient discharge.  Final Clinical Impression(s) / ED Diagnoses Final diagnoses:  Skin tear of left forearm without complication, initial encounter    Rx / DC Orders ED Discharge Orders     None         Smitty Knudsen, PA-C 12/31/22 1803    Alvira Monday, MD 01/01/23 [phone]

## 2022-12-28 ENCOUNTER — Telehealth: Payer: Self-pay

## 2022-12-28 ENCOUNTER — Ambulatory Visit: Payer: Medicare Other | Attending: Cardiovascular Disease

## 2022-12-28 DIAGNOSIS — Z5181 Encounter for therapeutic drug level monitoring: Secondary | ICD-10-CM | POA: Diagnosis not present

## 2022-12-28 DIAGNOSIS — I4891 Unspecified atrial fibrillation: Secondary | ICD-10-CM

## 2022-12-28 LAB — POCT INR: INR: 1.9 — AB (ref 2.0–3.0)

## 2022-12-28 NOTE — Patient Instructions (Addendum)
Description   Take an extra 1/2 tablet today and then resume taking 1/2 tablet daily, EXCEPT 1 tablet on Mondays and Fridays.  Stay consistent with boost (Mon, Wed, and Fri)  Recheck INR in 3 weeks  Coumadin Clinic [phone]

## 2022-12-28 NOTE — Telephone Encounter (Signed)
Lpm to still come in and have INR checked this afternoon 8/23

## 2022-12-31 ENCOUNTER — Telehealth: Payer: Self-pay

## 2022-12-31 NOTE — Patient Outreach (Signed)
  Care Coordination   12/31/2022 Name: Brent Griffith MRN: [id number] DOB: 03/24/1936   Care Coordination Outreach Attempts:  A second unsuccessful outreach was attempted today to offer the patient with information about available care coordination services.  Follow Up Plan:  Additional outreach attempts will be made to offer the patient care coordination information and services.   Encounter Outcome:  No Answer   Care Coordination Interventions:  No, not indicated    Bevelyn Ngo, BSW, CDP Social Worker, Certified Dementia Practitioner [facility] Care Management  Care Coordination [phone]

## 2023-01-01 ENCOUNTER — Ambulatory Visit: Payer: Medicare Other | Admitting: Physical Therapy

## 2023-01-01 DIAGNOSIS — R296 Repeated falls: Secondary | ICD-10-CM

## 2023-01-01 DIAGNOSIS — M6281 Muscle weakness (generalized): Secondary | ICD-10-CM

## 2023-01-01 DIAGNOSIS — R293 Abnormal posture: Secondary | ICD-10-CM | POA: Diagnosis not present

## 2023-01-01 NOTE — Therapy (Signed)
OUTPATIENT PHYSICAL THERAPY NEURO PROGRESS NOTE  Patient Name: Brent Griffith MRN: [id number] DOB:Sep 15, 1935, 87 y.o., male Today's Date: 01/01/2023   PCP: [name] REFERRING PROVIDER: [name]  END OF SESSION:  PT End of Session - 01/01/23 1228     Visit Number 4    Date for PT Re-Evaluation 01/15/23    Authorization Type [employer] Medicare    PT Start Time 1230    PT Stop Time 1300   pt not feeling well   PT Time Calculation (min) 30 min    Activity Tolerance Patient tolerated treatment well             Past Medical History:  Diagnosis Date   Acute gastric ulcer without mention of hemorrhage, perforation, or obstruction    Allergic rhinitis, cause unspecified    Anemia, unspecified    Arthritis    Asthma    Blood transfusion without reported diagnosis    CAD (coronary artery disease)    Diaphragmatic hernia without mention of obstruction or gangrene    Elevated prostate specific antigen (PSA)    Enlarged prostate    Esophageal reflux    Extrinsic asthma, unspecified    Herpes zoster without mention of complication    Hyperlipidemia    Hypersomnia with sleep apnea, unspecified    Impotence of organic origin    Kyphosis    Lumbago    Obstructive sleep apnea (adult) (pediatric)    Pneumonia    hx of years ago    Senile osteoporosis    Trigger finger (acquired)    Tubular adenoma of colon 05/2013   Unspecified essential hypertension    Unspecified sleep apnea    CPAP- settings 4-12    Unspecified vitamin D deficiency    Past Surgical History:  Procedure Laterality Date   CARDIAC CATHETERIZATION  03/04/2009   Dr Dorris Fetch   COLONOSCOPY     CORONARY ARTERY BYPASS GRAFT  2010   CYSTOSCOPY WITH RETROGRADE PYELOGRAM, URETEROSCOPY AND STENT PLACEMENT Right 10/04/2013   Procedure: CYSTOSCOPY WITH RETROGRADE PYELOGRAM,  AND STENT PLACEMENT;  Surgeon: Jerilee Field, MD;  Location: [facility];  Service: Urology;  Laterality: Right;   DENTAL  SURGERY N/A    MOLE REMOVAL  1958   chin   ROTATOR CUFF REPAIR Left 04/1998   with bone spur removed, Dr Madelon Lips   TONSILLECTOMY  1945   TRANSURETHRAL RESECTION OF PROSTATE N/A 09/24/2013   Procedure: TRANSURETHRAL RESECTION OF THE PROSTATE (TURP) WITH GYRUS (STAGED RIGHT LATERAL AND MEDIAN LOBE);  Surgeon: Kathi Ludwig, MD;  Location: [facility];  Service: Urology;  Laterality: N/A;   UPPER GI ENDOSCOPY     Patient Active Problem List   Diagnosis Date Noted   CKD (chronic kidney disease) stage 3, GFR 30-59 ml/min (HCC) 07/06/2022   Encounter for therapeutic drug monitoring 10/25/2021   Atrial fibrillation (HCC) 10/25/2021   PVC's (premature ventricular contractions) 05/04/2021   COVID-19 virus infection 02/20/2021   Sinusitis 01/24/2021   New onset atrial fibrillation (HCC) 01/19/2021   AKI (acute kidney injury) (HCC) 01/19/2021   HFrEF (heart failure with reduced ejection fraction) (HCC) 01/19/2021   CAD (coronary artery disease) 10/11/2017   IDA (iron deficiency anemia) 08/31/2016   Seasonal and perennial allergic rhinitis 08/28/2016   Palpitations 09/07/2015   Cough, persistent 07/20/2015   Senile osteoporosis 06/09/2014   Shortness of breath 03/20/2014   Hydronephrosis of right kidney 10/03/2013   Benign prostatic hyperplasia 09/24/2013   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  Dyslipidemia 04/06/2009   CARDIOVASCULAR FUNCTION STUDY, ABNORMAL 03/03/2009   Obstructive sleep apnea 02/04/2009   Essential hypertension 09/02/2008   GERD (gastroesophageal reflux disease) 09/02/2008   PERIODIC LIMB MOVEMENT DISORDER 09/03/2007    ONSET DATE: 6 months  REFERRING DIAG:  W19.XXXD (ICD-10-CM) - Fall, subsequent encounter  M40.203 (ICD-10-CM) - Kyphosis of cervicothoracic region, unspecified kyphosis type    THERAPY DIAG:  Frequent falls;   Rationale for Evaluation and Treatment: Rehabilitation  SUBJECTIVE:                                                                                                                                                                                              SUBJECTIVE STATEMENT: I missed Kendra's call from Social Work.   I woke up Sunday not feeling well and  coughing.  I tested negative for covid.     Eval:I've been diagnosed with gastric reflux.  I've lost weight and now I'm down to 107#.  I've gotten weak.  Recently rolled off of the bed recently. Falls are trips mostly.  Tripped up curb at church and hit head.  CT scan normal.  Hit head another time as well when tripped on sheet hanging off the side of the bed.  Had a tree fall and used a saw to cut branches and caused some shoulder/back pain.  C4 CT scan had not changed.  Fatigue more than pain. Gets back brace tomorrow from [employer] in [place] Phone shut off (unable to pay the bill)  Pt accompanied by: significant other [name] in lobby  PERTINENT HISTORY: cardiac disease; osteoporosis; HTN; heart failure; kidney disease Phone shut off (unable to pay the bill) Food insecurity PAIN:  PAIN:  Are you having pain? A little bit in my neck NPRS scale: 1-2/10 Pain location: history of neck/back pain   PRECAUTIONS: Fall     WEIGHT BEARING RESTRICTIONS: No  FALLS: Has patient fallen in last 6 months? Yes. Number of falls 2 but pt able to recount more than 2 falls in subjective  LIVING ENVIRONMENT: Lives with: lives with their spouse Lives in: House/apartment    PLOF: Independent  PATIENT GOALS: get stronger to do things around the house (put up ceiling fan in living room);  I'm worried about falling  (forward direction)  OBJECTIVE:   DIAGNOSTIC FINDINGS: CT scans: head, neck, back normal after falls  COGNITION: Overall cognitive status: Within functional limits for tasks assessed   POSTURE: very rounded shoulders, forward head, and increased thoracic kyphosis  LOWER EXTREMITY ROM:  WFLs   LOWER EXTREMITY MMT:  grossly 4-/5 (unable to rise from the chair without UE  support)  GAIT: Comments: decreased stride length, dec gait speed  FUNCTIONAL TESTS:  Unable to rise without UE assist; 5x sit to stand with UE use mod 18.20 TUG with hands 18.20 unsteady upon return and sits on corner of chair  PATIENT SURVEYS:  ABC scale 80% on 14 questions (eliminated carrying packages on escalator and walking on icy sidewalks)pt notes he needs UE support for reaching to the floor and reaching overhead)  MINI-BESTest: Balance Evaluation Systems Test (partial components) ANTICIPATORY: SIT TO STAND: (2) normal without use of hands and stabilizes independently     (1) Moderate comes to stand WITH use of hands on 1st attempt    (0) Severe: unable to stand up from chair without assistance OR needs several attempts with use of   hands Score: 0   RISE TO TOES: feet shoulder width apart. Hands on hips.  Rise as high as you can onto your toes. Try to hold this pose for 3 sec                          (2) stable for 3s with maximum height    (1) heels up, but not full range (smaller than when holding hands) OR noticeable instability for 3 sec                           (0) < or equalto 3 s  Score: 0   STAND ON ONE LEG: look straight ahead. Hands on hips. Lift your leg off the ground.     Left:  trial 1 _2___ trial 2___2_                                    Right: Trial 1:___2__   Trial 2:____2___   (2) 20 s       (2)  20 s                (1) < 20      (1) < 20 s   (0) Unable      (0) unable Score (use the worst side): 1   SENSORY ORIENTATION STANCE FEET TOGETHER EYES OPEN  firm surface Be as stable and still as possible until I say stop   (2) 30s   (1) < 30 s   (0) unable Score: 1 pt did well for 20 sec then sat down quickly in chair    STANCE ON FOAM EYES CLOSED feet together, eyes closed, hands on hips. Be as stable and still as you can until I stay stop.  I will start timing when you close your eyes. Time in seconds:      (2) 30s   (1) <30 s   (0) unable Score:  0 unable to attempt   INCLINE EYES CLOSED: Stand on incline with feet shoulder width apart, arms down at your sides.  I will start timing when you close your eyes   (2) Stands 30 s and aligns with gravity   (1) stands < 30 s OR aligns with surface   (0) unable Score:  0   TIMED UP AND GO NORMAL AND WITH DUAL TASK: 3 METER WALK: When I say go walk at your your normal speed across the tape, turn around and come back to sit in the chair   Time:  18.20 Count backwards by 3s  starting at    20.  When I say go, stand up from the chair, walk at your normal speed across the tape on the floor, turn around, come back to sit in the chair.  Continue counting backwards the entire time.  Time:   (2) No noticeable change in sitting, standing or walking while backward counting when compared to TUG without dual task   (1) Dual task affects either counting OR walking (>10%)    (0) stops counting while walking OR stops walking while counting If gait speed slows more than 10% between TUG without and with dual task, the score should be decreased by a point Score: 0 TUG: 24.13 sec dual task     8/14:Short Physical Performance Battery:    Balance:       Side by side stance:  1  points (1)  Semi -tandem:  0   points (1)  Tandem:    0  points (2)    Gait Speed: (13.1 feet) done 2x take the best time:   7.59 sec =2   points                                           > 8.70 sec=1 pt                                           6.21- 8.70 sec=2 pt                                           4.82-6.20 sec=3 pt                                            < 4.82 = 4 pts    Repeated Chair Stands:   20.62 sec with arms 0   points   (timer stopped when straight on 5th stand)                                              If used arms=0 pts                                              > 60 sec=0 pts                                              16.70 or more=1 pt                                               13.70-16.69=2  pts  11.20-13.69=3 pts                                               11.9 sec or less=4 pts Total score=    3  points <10/12 predictive of 1 or more mobility limitations and increased risk of mobility disability 6 or less/12 associated with a higher fall rate                                                 5x sit to stand test with UE use: 23.38                                                TODAY'S TREATMENT:  DATE:8/27: Nu-Step L3  (green model) 6 min while discussing status; decreased intensity per patient request: O2 95% HR 67 Seated 4# chops 8x right/left LAQ 4# 10x right/left Seated 4# ankle weight hip flexion/abduction 10x right/left Bil UE push/over head press seated 4# 10x Seated Dead lift red power cord 10x  UE pull: seated green band row 10x  Abdominals:  chair sit ups  holding 4# weight 10x RPE 8 or 9/10  DATE:8/22: Gave pt contact number for [employer] for financial strain Nu-Step L4 (green model) 6 min while discussing status Sit to stand mat table 2 sets of 5 (some compensatory push with backs of knees) Heel raises with overhead reach on cabinet 10x alternating hands Forward step over low cone on floor to simulate clearing door thresholds single arm support 10x each side (challenging) Counter top push ups 10x Bent at the counter top rows 5# KB 10x right/left  Standing 5# overhead press bil arm 10x right/left  High stepping, backwards walk, sidestepping next to the counter (light UE assist on the counter needed) Clock Yourself App: simple clock bil support 40 spm reports LE fatigue Seated 5# KB on knee up and over yardstick 10x right/left  Seated red power cord dead lifts 10x RPE 4/10                                                                                                                              DATE:8/14: SPPB Sit to stand chair + cushion no hands 2 sets of 5 Heel raises 10x at the counter 2 hand  support Alternating step taps 10x 2 hand support Forward step over yard stick on floor to simulate clearing door thresholds single arm support 10x each side (challenging) Counter top push ups 10x Bent at the counter top rows 10x right/left  Standing 2# overhead press  single arm 10x right/left  Standing 2# in each hand alternating bicep curls 10x  High stepping, backwards walk, sidestepping next to the counter (no UE assist on the counter needed) Patient given community resources handout    PATIENT EDUCATION: Education details: discussion of plan of care. Community resources handout Person educated: Patient Education method: Explanation Education comprehension: verbalized understanding  HOME EXERCISE PROGRAM: Access Code: [email or web address] URL: [email or web address] Date: 12/19/2022 Prepared by: Lavinia Sharps  Exercises - Sit to Stand with Arms Crossed  - 1 x daily - 7 x weekly - 2 sets - 5 reps - Heel Raises with Counter Support  - 1 x daily - 7 x weekly - 1 sets - 10 reps - Alternating Step Taps with Counter Support  - 1 x daily - 7 x weekly - 1 sets - 10 reps - Forward Step Over with Counter Support  - 1 x daily - 7 x weekly - 1 sets - 10 reps - Push-Up on Counter  - 1 x daily - 7 x weekly - 1 sets - 10 reps - Standing Bent Over Single Arm Scapular Row with Table Support with PLB  - 1 x daily - 7 x weekly - 1 sets - 10 reps  GOALS: Goals reviewed with patient? Yes  SHORT TERM GOALS: Target date: 12/18/2022  The patient will demonstrate knowledge of basic self care strategies and exercises to promote strength and balance Baseline: Goal status: INITIAL  2.  Improved LE strength needed to rise from a chair with greater ease; 5x STS in 16.5 sec Baseline:  Goal status: INITIAL  3.  The patient will have improved Timed Up and Go (TUG) time to    16.5   sec indicating improved gait speed and LE strength  Baseline:  Goal status: INITIAL  4.  Short Physical Physical  Battery score improved by 1 point indicating reduced frailty Baseline:  Goal status: INITIAL     LONG TERM GOALS: Target date: 01/15/2023   The patient will be independent in a safe self progression of a home exercise program to promote further recovery of function  Baseline:  Goal status: INITIAL  2.  The patient will have improved LE strength and balance to squat to pick up an object from the floor or reach overhead with holding onto something Baseline:  Goal status: INITIAL  3.  The patient will have improved Timed Up and Go (TUG) time to  15     sec indicating improved gait speed and LE strength  Baseline:  Goal status: INITIAL  4.  Short Physical Performance Battery score improved by 2 points indicating decreased risk of falls and improved strength Baseline:  Goal status: INITIAL  5.  Activities specific Balance Confidence scale improved to 85 (14 items rather 16) Baseline:  Goal status: INITIAL    ASSESSMENT:  CLINICAL IMPRESSION: Treatment modified secondary to patient not feeling well today.  Even with lower intensity ex's (all seated) he rates his perceived exertion level at 8 or 9/10.  Ended session early based on response.    OBJECTIVE IMPAIRMENTS: decreased activity tolerance, decreased balance, decreased endurance, decreased mobility, difficulty walking, decreased strength, impaired perceived functional ability, impaired UE functional use, and postural dysfunction.   ACTIVITY LIMITATIONS: carrying, lifting, bending, standing, squatting, stairs, reach over head, hygiene/grooming, locomotion level, and caring for others  PARTICIPATION LIMITATIONS: meal prep, cleaning, laundry, interpersonal relationship, driving, shopping, community activity, and yard work  PERSONAL FACTORS: Social background, Time since onset of injury/illness/exacerbation, and 3+  comorbidities: cardiac, HTN, kidney disease  are also affecting patient's functional outcome.  Food and financial  insecurity  REHAB POTENTIAL: Good  CLINICAL DECISION MAKING: Evolving/moderate complexity  EVALUATION COMPLEXITY: Moderate  PLAN:  PT FREQUENCY: 2x/week  PT DURATION: 8 weeks  PLANNED INTERVENTIONS: Therapeutic exercises, Therapeutic activity, Neuromuscular re-education, Balance training, Gait training, Patient/Family education, Self Care, Cryotherapy, Moist heat, Manual therapy, and Re-evaluation  PLAN FOR NEXT SESSION: see how bone density test went on 8/29;  general strengthening, low level balance; sit to stand; clearing thresholds, reaching/bending down; overhead reaching;  Clock Yourself app 40 spm or slower if no support  Lavinia Sharps, PT 01/01/23 12:58 PM Phone: [phone] Fax: [phone]

## 2023-01-03 ENCOUNTER — Ambulatory Visit: Payer: Medicare Other | Admitting: Physical Therapy

## 2023-01-03 DIAGNOSIS — R296 Repeated falls: Secondary | ICD-10-CM

## 2023-01-03 DIAGNOSIS — R293 Abnormal posture: Secondary | ICD-10-CM

## 2023-01-03 DIAGNOSIS — M6281 Muscle weakness (generalized): Secondary | ICD-10-CM | POA: Diagnosis not present

## 2023-01-03 NOTE — Therapy (Signed)
OUTPATIENT PHYSICAL THERAPY NEURO PROGRESS NOTE  Patient Name: Brent Griffith MRN: [id number] DOB:1936-01-06, 87 y.o., male Today's Date: 01/03/2023   PCP: [name] REFERRING PROVIDER: [name]  END OF SESSION:  PT End of Session - 01/03/23 1230     Visit Number 5    Date for PT Re-Evaluation 01/15/23    Authorization Type [employer]    PT Start Time 1226    PT Stop Time 1310    PT Time Calculation (min) 44 min    Activity Tolerance Patient tolerated treatment well             Past Medical History:  Diagnosis Date   Acute gastric ulcer without mention of hemorrhage, perforation, or obstruction    Allergic rhinitis, cause unspecified    Anemia, unspecified    Arthritis    Asthma    Blood transfusion without reported diagnosis    CAD (coronary artery disease)    Diaphragmatic hernia without mention of obstruction or gangrene    Elevated prostate specific antigen (PSA)    Enlarged prostate    Esophageal reflux    Extrinsic asthma, unspecified    Herpes zoster without mention of complication    Hyperlipidemia    Hypersomnia with sleep apnea, unspecified    Impotence of organic origin    Kyphosis    Lumbago    Obstructive sleep apnea (adult) (pediatric)    Pneumonia    hx of years ago    Senile osteoporosis    Trigger finger (acquired)    Tubular adenoma of colon 05/2013   Unspecified essential hypertension    Unspecified sleep apnea    CPAP- settings 4-12    Unspecified vitamin D deficiency    Past Surgical History:  Procedure Laterality Date   CARDIAC CATHETERIZATION  03/04/2009   Dr Dorris Fetch   COLONOSCOPY     CORONARY ARTERY BYPASS GRAFT  2010   CYSTOSCOPY WITH RETROGRADE PYELOGRAM, URETEROSCOPY AND STENT PLACEMENT Right 10/04/2013   Procedure: CYSTOSCOPY WITH RETROGRADE PYELOGRAM,  AND STENT PLACEMENT;  Surgeon: Jerilee Field, MD;  Location: [facility];  Service: Urology;  Laterality: Right;   DENTAL SURGERY N/A    MOLE  REMOVAL  1958   chin   ROTATOR CUFF REPAIR Left 04/1998   with bone spur removed, Dr Madelon Lips   TONSILLECTOMY  1945   TRANSURETHRAL RESECTION OF PROSTATE N/A 09/24/2013   Procedure: TRANSURETHRAL RESECTION OF THE PROSTATE (TURP) WITH GYRUS (STAGED RIGHT LATERAL AND MEDIAN LOBE);  Surgeon: Kathi Ludwig, MD;  Location: [facility];  Service: Urology;  Laterality: N/A;   UPPER GI ENDOSCOPY     Patient Active Problem List   Diagnosis Date Noted   CKD (chronic kidney disease) stage 3, GFR 30-59 ml/min (HCC) 07/06/2022   Encounter for therapeutic drug monitoring 10/25/2021   Atrial fibrillation (HCC) 10/25/2021   PVC's (premature ventricular contractions) 05/04/2021   COVID-19 virus infection 02/20/2021   Sinusitis 01/24/2021   New onset atrial fibrillation (HCC) 01/19/2021   AKI (acute kidney injury) (HCC) 01/19/2021   HFrEF (heart failure with reduced ejection fraction) (HCC) 01/19/2021   CAD (coronary artery disease) 10/11/2017   IDA (iron deficiency anemia) 08/31/2016   Seasonal and perennial allergic rhinitis 08/28/2016   Palpitations 09/07/2015   Cough, persistent 07/20/2015   Senile osteoporosis 06/09/2014   Shortness of breath 03/20/2014   Hydronephrosis of right kidney 10/03/2013   Benign prostatic hyperplasia 09/24/2013   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   Dyslipidemia 04/06/2009  CARDIOVASCULAR FUNCTION STUDY, ABNORMAL 03/03/2009   Obstructive sleep apnea 02/04/2009   Essential hypertension 09/02/2008   GERD (gastroesophageal reflux disease) 09/02/2008   PERIODIC LIMB MOVEMENT DISORDER 09/03/2007    ONSET DATE: 6 months  REFERRING DIAG:  W19.XXXD (ICD-10-CM) - Fall, subsequent encounter  M40.203 (ICD-10-CM) - Kyphosis of cervicothoracic region, unspecified kyphosis type    THERAPY DIAG:  Frequent falls;   Rationale for Evaluation and Treatment: Rehabilitation  SUBJECTIVE:                                                                                                                                                                                              SUBJECTIVE STATEMENT: I feel better.  Slept 8 1/2 hours last night.       Eval:I've been diagnosed with gastric reflux.  I've lost weight and now I'm down to 107#.  I've gotten weak.  Recently rolled off of the bed recently. Falls are trips mostly.  Tripped up curb at church and hit head.  CT scan normal.  Hit head another time as well when tripped on sheet hanging off the side of the bed.  Had a tree fall and used a saw to cut branches and caused some shoulder/back pain.  C4 CT scan had not changed.  Fatigue more than pain. Gets back brace tomorrow from [employer] in [place] Phone shut off (unable to pay the bill)  Pt accompanied by: significant other [name] in lobby  PERTINENT HISTORY: cardiac disease; osteoporosis; HTN; heart failure; kidney disease Phone shut off (unable to pay the bill) Food insecurity PAIN:  PAIN:  Are you having pain? A little bit in my neck NPRS scale: 1-2/10 Pain location: history of neck/back pain   PRECAUTIONS: Fall     WEIGHT BEARING RESTRICTIONS: No  FALLS: Has patient fallen in last 6 months? Yes. Number of falls 2 but pt able to recount more than 2 falls in subjective  LIVING ENVIRONMENT: Lives with: lives with their spouse Lives in: House/apartment    PLOF: Independent  PATIENT GOALS: get stronger to do things around the house (put up ceiling fan in living room);  I'm worried about falling  (forward direction)  OBJECTIVE:   DIAGNOSTIC FINDINGS: CT scans: head, neck, back normal after falls  COGNITION: Overall cognitive status: Within functional limits for tasks assessed   POSTURE: very rounded shoulders, forward head, and increased thoracic kyphosis  LOWER EXTREMITY ROM:  WFLs   LOWER EXTREMITY MMT:  grossly 4-/5 (unable to rise from the chair without UE support)  GAIT: Comments: decreased stride length, dec gait speed  FUNCTIONAL TESTS:  Unable to  rise without UE assist; 5x sit to stand with UE use mod 18.20 TUG with hands 18.20 unsteady upon return and sits on corner of chair  PATIENT SURVEYS:  ABC scale 80% on 14 questions (eliminated carrying packages on escalator and walking on icy sidewalks)pt notes he needs UE support for reaching to the floor and reaching overhead)  MINI-BESTest: Balance Evaluation Systems Test (partial components) ANTICIPATORY: SIT TO STAND: (2) normal without use of hands and stabilizes independently     (1) Moderate comes to stand WITH use of hands on 1st attempt    (0) Severe: unable to stand up from chair without assistance OR needs several attempts with use of   hands Score: 0   RISE TO TOES: feet shoulder width apart. Hands on hips.  Rise as high as you can onto your toes. Try to hold this pose for 3 sec                          (2) stable for 3s with maximum height    (1) heels up, but not full range (smaller than when holding hands) OR noticeable instability for 3 sec                           (0) < or equalto 3 s  Score: 0   STAND ON ONE LEG: look straight ahead. Hands on hips. Lift your leg off the ground.     Left:  trial 1 _2___ trial 2___2_                                    Right: Trial 1:___2__   Trial 2:____2___   (2) 20 s       (2)  20 s                (1) < 20      (1) < 20 s   (0) Unable      (0) unable Score (use the worst side): 1   SENSORY ORIENTATION STANCE FEET TOGETHER EYES OPEN  firm surface Be as stable and still as possible until I say stop   (2) 30s   (1) < 30 s   (0) unable Score: 1 pt did well for 20 sec then sat down quickly in chair    STANCE ON FOAM EYES CLOSED feet together, eyes closed, hands on hips. Be as stable and still as you can until I stay stop.  I will start timing when you close your eyes. Time in seconds:      (2) 30s   (1) <30 s   (0) unable Score: 0 unable to attempt   INCLINE EYES CLOSED: Stand on incline with feet shoulder width apart, arms  down at your sides.  I will start timing when you close your eyes   (2) Stands 30 s and aligns with gravity   (1) stands < 30 s OR aligns with surface   (0) unable Score:  0   TIMED UP AND GO NORMAL AND WITH DUAL TASK: 3 METER WALK: When I say go walk at your your normal speed across the tape, turn around and come back to sit in the chair   Time:  18.20 Count backwards by 3s starting at    20.  When I say go, stand up from the chair,  walk at your normal speed across the tape on the floor, turn around, come back to sit in the chair.  Continue counting backwards the entire time.  Time:   (2) No noticeable change in sitting, standing or walking while backward counting when compared to TUG without dual task   (1) Dual task affects either counting OR walking (>10%)    (0) stops counting while walking OR stops walking while counting If gait speed slows more than 10% between TUG without and with dual task, the score should be decreased by a point Score: 0 TUG: 24.13 sec dual task     8/14:Short Physical Performance Battery:    Balance:       Side by side stance:  1  points (1)  Semi -tandem:  0   points (1)  Tandem:    0  points (2)    Gait Speed: (13.1 feet) done 2x take the best time:   7.59 sec =2   points                                           > 8.70 sec=1 pt                                           6.21- 8.70 sec=2 pt                                           4.82-6.20 sec=3 pt                                            < 4.82 = 4 pts    Repeated Chair Stands:   20.62 sec with arms 0   points   (timer stopped when straight on 5th stand)                                              If used arms=0 pts                                              > 60 sec=0 pts                                              16.70 or more=1 pt                                               13.70-16.69=2 pts  11.20-13.69=3 pts                                                11.9 sec or less=4 pts Total score=    3  points <10/12 predictive of 1 or more mobility limitations and increased risk of mobility disability 6 or less/12 associated with a higher fall rate                                                 5x sit to stand test with UE use: 23.38                                                TODAY'S TREATMENT:  DATE:8/29: Nu-Step L3  (green model) 6 min while discussing status O2 95% HR 67 Seated 4# chops 8x right/left LAQ 5# 10x right/left Seated 5# ankle weight hip flexion/abduction 10x right/left Seated 4# external rotation 5x right/left  Bil UE push/over head press seated 4# 10x Seated 5# ankle weights alternating hip flexion/abduction over targets Seated red power cord leg press out 10x  UE pull: seated green band row 10x  Seated UE bil shoulder extension green band 10x Sit to stand from mat table plus cushion (unable to rise without hands from mat height) 10x Standing heel raises 10x Standing hip abduction 10x right/left alternating pattern Standing bicep curls 4# 10x right/left  O2 97 HR 68 4# single arm dead lifts to knee level 10x right/left RPE 3 or 4/10  DATE:8/27: Nu-Step L3  (green model) 6 min while discussing status; decreased intensity per patient request: O2 95% HR 67 Seated 4# chops 8x right/left LAQ 4# 10x right/left Seated 4# ankle weight hip flexion/abduction 10x right/left Bil UE push/over head press seated 4# 10x Seated Dead lift red power cord 10x  UE pull: seated green band row 10x  Abdominals:  chair sit ups  holding 4# weight 10x RPE 8 or 9/10  DATE:8/22: Gave pt contact number for [employer] for financial strain Nu-Step L4 (green model) 6 min while discussing status Sit to stand mat table 2 sets of 5 (some compensatory push with backs of knees) Heel raises with overhead reach on cabinet 10x alternating hands Forward step over low cone on floor to simulate clearing door thresholds single arm  support 10x each side (challenging) Counter top push ups 10x Bent at the counter top rows 5# KB 10x right/left  Standing 5# overhead press bil arm 10x right/left  High stepping, backwards walk, sidestepping next to the counter (light UE assist on the counter needed) Clock Yourself App: simple clock bil support 40 spm reports LE fatigue Seated 5# KB on knee up and over yardstick 10x right/left  Seated red power cord dead lifts 10x RPE 4/10           PATIENT EDUCATION: Education details: discussion of plan of care. Community resources handout Person educated: Patient Education method: Explanation Education comprehension: verbalized understanding  HOME EXERCISE PROGRAM: Access Code: [email or web address] URL: [email or web address] Date: 12/19/2022 Prepared by: Lavinia Sharps  Exercises - Sit to Stand with Arms Crossed  -  1 x daily - 7 x weekly - 2 sets - 5 reps - Heel Raises with Counter Support  - 1 x daily - 7 x weekly - 1 sets - 10 reps - Alternating Step Taps with Counter Support  - 1 x daily - 7 x weekly - 1 sets - 10 reps - Forward Step Over with Counter Support  - 1 x daily - 7 x weekly - 1 sets - 10 reps - Push-Up on Counter  - 1 x daily - 7 x weekly - 1 sets - 10 reps - Standing Bent Over Single Arm Scapular Row with Table Support with PLB  - 1 x daily - 7 x weekly - 1 sets - 10 reps  GOALS: Goals reviewed with patient? Yes  SHORT TERM GOALS: Target date: 12/18/2022  The patient will demonstrate knowledge of basic self care strategies and exercises to promote strength and balance Baseline: Goal status: met 8/29 2.  Improved LE strength needed to rise from a chair with greater ease; 5x STS in 16.5 sec Baseline:  Goal status: INITIAL  3.  The patient will have improved Timed Up and Go (TUG) time to    16.5   sec indicating improved gait speed and LE strength  Baseline:  Goal status: INITIAL  4.  Short Physical Physical Battery score improved by 1 point indicating  reduced frailty Baseline:  Goal status: INITIAL     LONG TERM GOALS: Target date: 01/15/2023   The patient will be independent in a safe self progression of a home exercise program to promote further recovery of function  Baseline:  Goal status: INITIAL  2.  The patient will have improved LE strength and balance to squat to pick up an object from the floor or reach overhead with holding onto something Baseline:  Goal status: INITIAL  3.  The patient will have improved Timed Up and Go (TUG) time to  15     sec indicating improved gait speed and LE strength  Baseline:  Goal status: INITIAL  4.  Short Physical Performance Battery score improved by 2 points indicating decreased risk of falls and improved strength Baseline:  Goal status: INITIAL  5.  Activities specific Balance Confidence scale improved to 85 (14 items rather 16) Baseline:  Goal status: INITIAL    ASSESSMENT:  CLINICAL IMPRESSION: The patient is feeling better today and is able to resume some standing exercise.  He continues to have LE weakness affecting his ability to rise without UE assist.  Therapist closely monitoring for excessive fatigue and providing verbal cues to optimize exercise technique.     OBJECTIVE IMPAIRMENTS: decreased activity tolerance, decreased balance, decreased endurance, decreased mobility, difficulty walking, decreased strength, impaired perceived functional ability, impaired UE functional use, and postural dysfunction.   ACTIVITY LIMITATIONS: carrying, lifting, bending, standing, squatting, stairs, reach over head, hygiene/grooming, locomotion level, and caring for others  PARTICIPATION LIMITATIONS: meal prep, cleaning, laundry, interpersonal relationship, driving, shopping, community activity, and yard work  PERSONAL FACTORS: Social background, Time since onset of injury/illness/exacerbation, and 3+ comorbidities: cardiac, HTN, kidney disease  are also affecting patient's functional  outcome.  Food and financial insecurity  REHAB POTENTIAL: Good  CLINICAL DECISION MAKING: Evolving/moderate complexity  EVALUATION COMPLEXITY: Moderate  PLAN:  PT FREQUENCY: 2x/week  PT DURATION: 8 weeks  PLANNED INTERVENTIONS: Therapeutic exercises, Therapeutic activity, Neuromuscular re-education, Balance training, Gait training, Patient/Family education, Self Care, Cryotherapy, Moist heat, Manual therapy, and Re-evaluation  PLAN FOR NEXT SESSION:  bone density test  on 9/5;  check 5x sit to stand, SPPB and TUG; general strengthening, low level balance; sit to stand; clearing thresholds, reaching/bending down; overhead reaching;  Clock Yourself app 40 spm or slower if no support  Lavinia Sharps, PT 01/03/23 2:05 PM Phone: [phone] Fax: [phone]

## 2023-01-04 ENCOUNTER — Telehealth: Payer: Self-pay | Admitting: *Deleted

## 2023-01-04 NOTE — Progress Notes (Signed)
  Care Coordination Note  01/04/2023 Name: Brent Griffith MRN: [id number] DOB: Nov 01, 1935  Brent Griffith is a 87 y.o. year old male who is a primary care patient of Sharon Seller, NP and is actively engaged with the care management team. I reached out to [name] by phone today to assist with scheduling an initial visit with the BSW  Follow up plan: Unsuccessful telephone outreach attempt made. A HIPAA compliant phone message was left for the patient providing contact information and requesting a return call.   [facility]  Care Coordination Care Guide  Direct Dial: [phone]

## 2023-01-08 ENCOUNTER — Ambulatory Visit (HOSPITAL_BASED_OUTPATIENT_CLINIC_OR_DEPARTMENT_OTHER)
Admission: RE | Admit: 2023-01-08 | Discharge: 2023-01-08 | Disposition: A | Discharge status: Home / Self Care | Payer: Medicare Other | Source: Ambulatory Visit | Attending: Sports Medicine | Admitting: Sports Medicine

## 2023-01-08 ENCOUNTER — Encounter: Payer: Medicare Other | Admitting: Physical Therapy

## 2023-01-08 DIAGNOSIS — M810 Age-related osteoporosis without current pathological fracture: Secondary | ICD-10-CM | POA: Insufficient documentation

## 2023-01-09 ENCOUNTER — Ambulatory Visit (INDEPENDENT_AMBULATORY_CARE_PROVIDER_SITE_OTHER): Payer: Medicare Other

## 2023-01-09 DIAGNOSIS — Z23 Encounter for immunization: Secondary | ICD-10-CM

## 2023-01-09 NOTE — Progress Notes (Signed)
  Care Coordination Note  01/09/2023 Name: Brent Griffith MRN: [id number] DOB: [birth year]  Brent Griffith is a 87 y.o. year old male who is a primary care patient of Sharon Seller, NP and is actively engaged with the care management team. I reached out to Brent Griffith by phone today to assist with re-scheduling an initial visit with the BSW  Follow up plan: Unsuccessful telephone outreach attempt made. A HIPAA compliant phone message was left for the patient providing contact information and requesting a return call.   [facility]  Care Coordination Care Guide  Direct Dial: [phone]

## 2023-01-10 ENCOUNTER — Ambulatory Visit: Payer: Medicare Other | Attending: Nurse Practitioner | Admitting: Physical Therapy

## 2023-01-10 DIAGNOSIS — R293 Abnormal posture: Secondary | ICD-10-CM | POA: Insufficient documentation

## 2023-01-10 DIAGNOSIS — R296 Repeated falls: Secondary | ICD-10-CM | POA: Insufficient documentation

## 2023-01-10 DIAGNOSIS — M6281 Muscle weakness (generalized): Secondary | ICD-10-CM | POA: Diagnosis not present

## 2023-01-10 NOTE — Progress Notes (Signed)
HPI: FU coronary artery disease and atrial fibrillation. Cardiac catheterization on March 04, 2009 revealed severe three-vessel coronary disease as well as an 80% left main. His ejection fraction was 50%. He ultimately underwent coronary artery bypass and graft on November 1 (left internal mammary artery to left anterior descending, sequential saphenous vein graft to ramus intermedius and obtuse marginal 2, saphenous vein graft to posterior descending. Nuclear study April 2018 showed ejection fraction 48%. There was prior inferolateral infarct and mild apical ischemia.  Echocardiogram repeated September 2022 and showed ejection fraction 40 to 45%, mild mitral regurgitation, mild aortic insufficiency.  Monitor November 2022 showed sinus rhythm with occasional PAC, 5 beats PAT and occasional PVC.  Diagnosed with atrial fibrillation September 2022.  Started on amiodarone.  Also noted to have PVCs and mexiletine was added.  Since he was last seen, he denies dyspnea, chest pain, palpitations or syncope.  No bleeding.  Current Outpatient Medications  Medication Sig Dispense Refill   albuterol (VENTOLIN HFA) 108 (90 Base) MCG/ACT inhaler Inhale 1-2 puffs into the lungs every 6 (six) hours as needed for wheezing or shortness of breath. 34 g 3   amiodarone (PACERONE) 200 MG tablet TAKE 1 TABLET BY MOUTH DAILY 90 tablet 3   Calcium Citrate-Vitamin D (EQ CALCIUM CITRATE+D3 PO) Take 1,200 mg by mouth daily.     carvedilol (COREG) 3.125 MG tablet Take 1 tablet (3.125 mg total) by mouth 2 (two) times daily. Please keep scheduled appointment with cardiologist 180 tablet 3   Cholecalciferol (VITAMIN D-3) 5000 units TABS Take 10,000 Units by mouth daily.     Coenzyme Q10 (COQ10) 200 MG CAPS Take 200 mg by mouth daily.     doxazosin (CARDURA) 2 MG tablet TAKE 1 TABLET BY MOUTH DAILY 100 tablet 0   empagliflozin (JARDIANCE) 10 MG TABS tablet Take 1 tablet (10 mg total) by mouth daily before breakfast.      fexofenadine (ALLEGRA) 180 MG tablet Take 180 mg by mouth daily as needed for allergies.     finasteride (PROSCAR) 5 MG tablet TAKE 1 TABLET BY MOUTH IN  THE MORNING 90 tablet 3   furosemide (LASIX) 20 MG tablet TAKE 1 TABLET BY MOUTH DAILY AS  NEEDED FOR LEG SWELLING 100 tablet 2   HYDROcodone-acetaminophen (NORCO/VICODIN) 5-325 MG tablet Take 1 tablet by mouth every 6 (six) hours as needed for moderate pain. 14 tablet 0   Iron, Ferrous Sulfate, 325 (65 Fe) MG TABS Take 325 mg by mouth 2 (two) times daily. 30 tablet    losartan (COZAAR) 25 MG tablet Take 1 tablet (25 mg total) by mouth daily. Please keep scheduled appointment with cardiologist 100 tablet 2   mexiletine (MEXITIL) 150 MG capsule Take 1 capsule (150 mg total) by mouth 2 (two) times daily. 180 capsule 2   mirtazapine (REMERON) 7.5 MG tablet Take 1 tablet (7.5 mg total) by mouth at bedtime. 30 tablet 3   pantoprazole (PROTONIX) 40 MG tablet Take 1 tablet (40 mg total) by mouth daily. 90 tablet 1   rosuvastatin (CRESTOR) 20 MG tablet TAKE 1 TABLET BY MOUTH ONCE DAILY FOR CHOLESTEROL 90 tablet 1   warfarin (COUMADIN) 2.5 MG tablet TAKE 1/2 TO 1 TABLET BY MOUTH  DAILY AS DIRECTED BY THE  COUMADIN CLINIC 80 tablet 1   alum & mag hydroxide-simeth (MAALOX MAX) 400-400-40 MG/5ML suspension Take 10 mLs by mouth every 6 (six) hours as needed (pain with swallowing and reflux). (Patient not taking: Reported on 01/18/2023)  355 mL 0   famotidine (PEPCID) 20 MG tablet Take 1 tablet (20 mg total) by mouth 2 (two) times daily. 60 tablet 0   No current facility-administered medications for this visit.     Past Medical History:  Diagnosis Date   Acute gastric ulcer without mention of hemorrhage, perforation, or obstruction    Allergic rhinitis, cause unspecified    Anemia, unspecified    Arthritis    Asthma    Blood transfusion without reported diagnosis    CAD (coronary artery disease)    Diaphragmatic hernia without mention of obstruction or  gangrene    Elevated prostate specific antigen (PSA)    Enlarged prostate    Esophageal reflux    Extrinsic asthma, unspecified    Herpes zoster without mention of complication    Hyperlipidemia    Hypersomnia with sleep apnea, unspecified    Impotence of organic origin    Kyphosis    Lumbago    Obstructive sleep apnea (adult) (pediatric)    Pneumonia    hx of years ago    Senile osteoporosis    Trigger finger (acquired)    Tubular adenoma of colon 05/2013   Unspecified essential hypertension    Unspecified sleep apnea    CPAP- settings 4-12    Unspecified vitamin D deficiency     Past Surgical History:  Procedure Laterality Date   CARDIAC CATHETERIZATION  03/04/2009   Dr Dorris Fetch   COLONOSCOPY     CORONARY ARTERY BYPASS GRAFT  2010   CYSTOSCOPY WITH RETROGRADE PYELOGRAM, URETEROSCOPY AND STENT PLACEMENT Right 10/04/2013   Procedure: CYSTOSCOPY WITH RETROGRADE PYELOGRAM,  AND STENT PLACEMENT;  Surgeon: Jerilee Field, MD;  Location: [facility];  Service: Urology;  Laterality: Right;   DENTAL SURGERY N/A    MOLE REMOVAL  1958   chin   ROTATOR CUFF REPAIR Left 04/1998   with bone spur removed, Dr Madelon Lips   TONSILLECTOMY  1945   TRANSURETHRAL RESECTION OF PROSTATE N/A 09/24/2013   Procedure: TRANSURETHRAL RESECTION OF THE PROSTATE (TURP) WITH GYRUS (STAGED RIGHT LATERAL AND MEDIAN LOBE);  Surgeon: Kathi Ludwig, MD;  Location: [facility];  Service: Urology;  Laterality: N/A;   UPPER GI ENDOSCOPY      Social History   Socioeconomic History   Marital status: Married    Spouse name: Not on file   Number of children: 0   Years of education: Not on file   Highest education level: Not on file  Occupational History   Occupation: Network engineer  Tobacco Use   Smoking status: Never    Passive exposure: Never   Smokeless tobacco: Never  Vaping Use   Vaping status: Never Used  Substance and Sexual Activity   Alcohol use: No   Drug use: No   Sexual  activity: Not Currently  Other Topics Concern   Not on file  Social History Narrative   Not on file   Social Determinants of Health   Financial Resource Strain: Not on file  Food Insecurity: Food Insecurity Present (01/17/2023)   Hunger Vital Sign    Worried About Running Out of Food in the Last Year: Sometimes true    Ran Out of Food in the Last Year: Sometimes true  Transportation Needs: No Transportation Needs (01/17/2023)   PRAPARE - Administrator, Civil Service (Medical): No    Lack of Transportation (Non-Medical): No  Physical Activity: Insufficiently Active (12/18/2022)   Exercise Vital Sign    Days of Exercise  per Week: 2 days    Minutes of Exercise per Session: 30 min  Stress: Not on file  Social Connections: Not on file  Intimate Partner Violence: Not on file    Family History  Problem Relation Age of Onset   Stroke Mother    Breast cancer Mother    Cirrhosis Mother        wine   Rheumatic fever Mother    Kyphosis Mother    Hypertension Father    Stroke Father    Heart disease Father    Heart disease Paternal Grandfather    Colon cancer Neg Hx     ROS: no fevers or chills, productive cough, hemoptysis, dysphasia, odynophagia, melena, hematochezia, dysuria, hematuria, rash, seizure activity, orthopnea, PND, pedal edema, claudication. Remaining systems are negative.  Physical Exam: Well-developed well-nourished in no acute distress.  Skin is warm and dry.  HEENT is normal.  Neck is supple.  Chest is clear to auscultation with normal expansion.  Cardiovascular exam is regular rate and rhythm.  Abdominal exam nontender or distended. No masses palpated. Extremities show no edema. neuro grossly intact   A/P  1 paroxysmal atrial fibrillation-patient is in sinus rhythm today on exam.  Continue amiodarone and apixaban.  Check hemoglobin, renal function, TSH, free T4, liver functions.  Note he has fallen some recently.  Of asked him to use a cane.  If  this continues we may need to discontinue anticoagulation as risk would outweigh benefit.  2 hypertension-blood pressure mildly elevated.  I have asked him to check this at home.  If remains elevated we will change losartan to Entresto.  3 hyperlipidemia-continue statin.  Check lipids and liver.  4 chronic combined systolic/diastolic congestive heart failure-continue losartan, beta-blocker, Lasix and Jardiance.  Patient appears to be euvolemic.  Check potassium and renal function.  5 coronary artery disease-patient is status post coronary bypass and graft.  He is not having chest pain.  Continue statin.  No aspirin given need for anticoagulation.  6 history of PVCs-continue amiodarone and mexiletine.  Olga Millers, MD

## 2023-01-10 NOTE — Therapy (Signed)
OUTPATIENT PHYSICAL THERAPY NEURO PROGRESS NOTE  Patient Name: Brent Griffith MRN: [id number] DOB:[birth year], 87 y.o., male Today's Date: 01/10/2023   PCP: Brent Griffith REFERRING PROVIDER: Abbey Chatters Griffith  END OF SESSION:  PT End of Session - 01/10/23 1218     Visit Number 6    Date for PT Re-Evaluation 01/15/23    Authorization Type [employer] Medicare    PT Start Time 1219    PT Stop Time 1305    PT Time Calculation (min) 46 min    Activity Tolerance Patient tolerated treatment well             Past Medical History:  Diagnosis Date   Acute gastric ulcer without mention of hemorrhage, perforation, or obstruction    Allergic rhinitis, cause unspecified    Anemia, unspecified    Arthritis    Asthma    Blood transfusion without reported diagnosis    CAD (coronary artery disease)    Diaphragmatic hernia without mention of obstruction or gangrene    Elevated prostate specific antigen (PSA)    Enlarged prostate    Esophageal reflux    Extrinsic asthma, unspecified    Herpes zoster without mention of complication    Hyperlipidemia    Hypersomnia with sleep apnea, unspecified    Impotence of organic origin    Kyphosis    Lumbago    Obstructive sleep apnea (adult) (pediatric)    Pneumonia    hx of years ago    Senile osteoporosis    Trigger finger (acquired)    Tubular adenoma of colon 05/2013   Unspecified essential hypertension    Unspecified sleep apnea    CPAP- settings 4-12    Unspecified vitamin D deficiency    Past Surgical History:  Procedure Laterality Date   CARDIAC CATHETERIZATION  03/04/2009   Dr Dorris Fetch   COLONOSCOPY     CORONARY ARTERY BYPASS GRAFT  2010   CYSTOSCOPY WITH RETROGRADE PYELOGRAM, URETEROSCOPY AND STENT PLACEMENT Right 10/04/2013   Procedure: CYSTOSCOPY WITH RETROGRADE PYELOGRAM,  AND STENT PLACEMENT;  Surgeon: Jerilee Field, MD;  Location: [facility];  Service: Urology;  Laterality: Right;   DENTAL SURGERY N/A    MOLE REMOVAL   1958   chin   ROTATOR CUFF REPAIR Left 04/1998   with bone spur removed, Dr Madelon Lips   TONSILLECTOMY  1945   TRANSURETHRAL RESECTION OF PROSTATE N/A 09/24/2013   Procedure: TRANSURETHRAL RESECTION OF THE PROSTATE (TURP) WITH GYRUS (STAGED RIGHT LATERAL AND MEDIAN LOBE);  Surgeon: Kathi Ludwig, MD;  Location: [facility];  Service: Urology;  Laterality: N/A;   UPPER GI ENDOSCOPY     Patient Active Problem List   Diagnosis Date Noted   CKD (chronic kidney disease) stage 3, GFR 30-59 ml/min (HCC) 07/06/2022   Encounter for therapeutic drug monitoring 10/25/2021   Atrial fibrillation (HCC) 10/25/2021   PVC's (premature ventricular contractions) 05/04/2021   COVID-19 virus infection 02/20/2021   Sinusitis 01/24/2021   New onset atrial fibrillation (HCC) 01/19/2021   AKI (acute kidney injury) (HCC) 01/19/2021   HFrEF (heart failure with reduced ejection fraction) (HCC) 01/19/2021   CAD (coronary artery disease) 10/11/2017   IDA (iron deficiency anemia) 08/31/2016   Seasonal and perennial allergic rhinitis 08/28/2016   Palpitations 09/07/2015   Cough, persistent 07/20/2015   Senile osteoporosis 06/09/2014   Shortness of breath 03/20/2014   Hydronephrosis of right kidney 10/03/2013   Benign prostatic hyperplasia 09/24/2013   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   Dyslipidemia 04/06/2009  CARDIOVASCULAR FUNCTION STUDY, ABNORMAL 03/03/2009   Obstructive sleep apnea 02/04/2009   Essential hypertension 09/02/2008   GERD (gastroesophageal reflux disease) 09/02/2008   PERIODIC LIMB MOVEMENT DISORDER 09/03/2007    ONSET DATE: 6 months  REFERRING DIAG:  W19.XXXD (ICD-10-CM) - Fall, subsequent encounter  M40.203 (ICD-10-CM) - Kyphosis of cervicothoracic region, unspecified kyphosis type    THERAPY DIAG:  Frequent falls;   Rationale for Evaluation and Treatment: Rehabilitation  SUBJECTIVE:                                                                                                                                                                                              SUBJECTIVE STATEMENT:    I'm about the same.  States he wasn't overly tired after last visit.  See recent Dexa scan results below.  Osteoporosis in lumbar spine, osteopenic in femur and radius.     Eval:I've been diagnosed with gastric reflux.  I've lost weight and now I'm down to 107#.  I've gotten weak.  Recently rolled off of the bed recently. Falls are trips mostly.  Tripped up curb at church and hit head.  CT scan normal.  Hit head another time as well when tripped on sheet hanging off the side of the bed.  Had a tree fall and used a saw to cut branches and caused some shoulder/back pain.  C4 CT scan had not changed.  Fatigue more than pain. Gets back brace tomorrow from [employer] in [place] Phone shut off (unable to pay the bill)  Pt accompanied by: significant other Brent Griffith in lobby  PERTINENT HISTORY: cardiac disease; osteoporosis; HTN; heart failure; kidney disease Phone shut off (unable to pay the bill) Food insecurity PAIN:  PAIN:  Are you having pain? A little bit in my neck NPRS scale: 1-2/10 Pain location: history of neck/back pain   PRECAUTIONS: Fall     WEIGHT BEARING RESTRICTIONS: No  FALLS: Has patient fallen in last 6 months? Yes. Number of falls 2 but pt able to recount more than 2 falls in subjective  LIVING ENVIRONMENT: Lives with: lives with their spouse Lives in: House/apartment    PLOF: Independent  PATIENT GOALS: get stronger to do things around the house (put up ceiling fan in living room);  I'm worried about falling  (forward direction)  OBJECTIVE:   DIAGNOSTIC FINDINGS: CT scans: head, neck, back normal after falls 9/3 Dexa: Site Region Measured Date Measured Age WHO YA BMD Classification T-score AP Spine L2-L4 (L3) 01/08/20[age] Osteoporosis -3.2 0.819 g/cm2 AP Spine L2-L4 (L3) 01/02/2021 83.1 Low Bone Mass -1.8 0.982 g/cm2   DualFemur Total Mean 01/08/2023  85.1 Low  Bone Mass -1.6 0.806 g/cm2 DualFemur Total Mean 01/02/2021 83.1 Normal -0.7 0.914 g/cm2   Left Forearm Radius 33% 01/08/2023 85.1 Low Bone Mass -1.8 0.816 g/cm2 Left Forearm Radius 33% 01/02/2021 83.1 Low Bone Mass -1.3 0.856 g/cm2   World Health Organization [facility]) criteria for post-menopausal, Caucasian Women: Normal        T-score at or above -1 SD Low Bone Mass T-score between -1 and -2.5 SD Osteoporosis  T-score at or below -2.5 SD  COGNITION: Overall cognitive status: Within functional limits for tasks assessed   POSTURE: very rounded shoulders, forward head, and increased thoracic kyphosis  LOWER EXTREMITY ROM:  WFLs   LOWER EXTREMITY MMT:  grossly 4-/5 (unable to rise from the chair without UE support)  GAIT: Comments: decreased stride length, dec gait speed  FUNCTIONAL TESTS:  Unable to rise without UE assist; 5x sit to stand with UE use mod 18.20 TUG with hands 18.20 unsteady upon return and sits on corner of chair  PATIENT SURVEYS:  ABC scale 80% on 14 questions (eliminated carrying packages on escalator and walking on icy sidewalks)pt notes he needs UE support for reaching to the floor and reaching overhead)  MINI-BESTest: Balance Evaluation Systems Test (partial components) ANTICIPATORY: SIT TO STAND: (2) normal without use of hands and stabilizes independently     (1) Moderate comes to stand WITH use of hands on 1st attempt    (0) Severe: unable to stand up from chair without assistance OR needs several attempts with use of   hands Score: 0   RISE TO TOES: feet shoulder width apart. Hands on hips.  Rise as high as you can onto your toes. Try to hold this pose for 3 sec                          (2) stable for 3s with maximum height    (1) heels up, but not full range (smaller than when holding hands) OR noticeable instability for 3 sec                           (0) < or equalto 3 s  Score: 0   STAND ON ONE LEG: look straight ahead. Hands on hips. Lift your  leg off the ground.     Left:  trial 1 _2___ trial 2___2_                                    Right: Trial 1:___2__   Trial 2:____2___   (2) 20 s       (2)  20 s                (1) < 20      (1) < 20 s   (0) Unable      (0) unable Score (use the worst side): 1   SENSORY ORIENTATION STANCE FEET TOGETHER EYES OPEN  firm surface Be as stable and still as possible until I say stop   (2) 30s   (1) < 30 s   (0) unable Score: 1 pt did well for 20 sec then sat down quickly in chair    STANCE ON FOAM EYES CLOSED feet together, eyes closed, hands on hips. Be as stable and still as you can until I stay stop.  I will start timing when  you close your eyes. Time in seconds:      (2) 30s   (1) <30 s   (0) unable Score: 0 unable to attempt   INCLINE EYES CLOSED: Stand on incline with feet shoulder width apart, arms down at your sides.  I will start timing when you close your eyes   (2) Stands 30 s and aligns with gravity   (1) stands < 30 s OR aligns with surface   (0) unable Score:  0   TIMED UP AND GO NORMAL AND WITH DUAL TASK: 3 METER WALK: When I say go walk at your your normal speed across the tape, turn around and come back to sit in the chair   Time:  18.20 Count backwards by 3s starting at    20.  When I say go, stand up from the chair, walk at your normal speed across the tape on the floor, turn around, come back to sit in the chair.  Continue counting backwards the entire time.  Time:   (2) No noticeable change in sitting, standing or walking while backward counting when compared to TUG without dual task   (1) Dual task affects either counting OR walking (>10%)    (0) stops counting while walking OR stops walking while counting If gait speed slows more than 10% between TUG without and with dual task, the score should be decreased by a point Score: 0 TUG: 24.13 sec dual task     8/14:Short Physical Performance Battery:    Balance:       Side by side stance:  1  points (1)  Semi  -tandem:  0   points (1)  Tandem:    0  points (2)    Gait Speed: (13.1 feet) done 2x take the best time:   7.59 sec =2   points                                           > 8.70 sec=1 pt                                           6.21- 8.70 sec=2 pt                                           4.82-6.20 sec=3 pt                                            < 4.82 = 4 pts    Repeated Chair Stands:   20.62 sec with arms 0   points   (timer stopped when straight on 5th stand)                                              If used arms=0 pts                                              >  60 sec=0 pts                                              16.70 or more=1 pt                                               13.70-16.69=2 pts                                               11.20-13.69=3 pts                                               11.9 sec or less=4 pts Total score=    3  points <10/12 predictive of 1 or more mobility limitations and increased risk of mobility disability 6 or less/12 associated with a higher fall rate                                                 5x sit to stand test with UE use: 23.38                                                TODAY'S TREATMENT:  DATE:9/5: Nu-Step L4  (green model) 6 min while discussing status O2 95% HR 67 Seated 5# chops 10x right/left 5# KB on knee up and over yardstick Bil UE push/over head press seated 5# 10x Seated pink power cord leg press out 10x right/left Seated pink power cord trunk extension 10x Sit to stand from mat table plus cushion and holding 5# KB(unable to rise without hands from mat height) 10x Impact ex's see pt instructions: Standing heel drops 10x Counter push offs 10x Stomping 10x Small jumps 2 sets of 5 holding to the sink basin Weight bearing hands on counter with mountain climber/marching Les 10x 5# KB single arm dead lifts to knee level 10x right/left Trunk extension at the counter encouraging neck extension 10x RPE  2/10  DATE:8/29: Nu-Step L3  (green model) 6 min while discussing status O2 95% HR 67 Seated 4# chops 8x right/left LAQ 5# 10x right/left Seated 5# ankle weight hip flexion/abduction 10x right/left Seated 4# external rotation 5x right/left  Bil UE push/over head press seated 4# 10x Seated 5# ankle weights alternating hip flexion/abduction over targets Seated red power cord leg press out 10x  UE pull: seated green band row 10x  Seated UE bil shoulder extension green band 10x Sit to stand from mat table plus cushion (unable to rise without hands from mat height) 10x Standing heel raises 10x Standing hip abduction 10x right/left alternating pattern Standing bicep curls 4# 10x right/left  O2 97 HR 68 4# single arm dead lifts to knee level 10x right/left RPE  3 or 4/10  DATE:8/27: Nu-Step L3  (green model) 6 min while discussing status; decreased intensity per patient request: O2 95% HR 67 Seated 4# chops 8x right/left LAQ 4# 10x right/left Seated 4# ankle weight hip flexion/abduction 10x right/left Bil UE push/over head press seated 4# 10x Seated Dead lift red power cord 10x  UE pull: seated green band row 10x  Abdominals:  chair sit ups  holding 4# weight 10x RPE 8 or 9/10           PATIENT EDUCATION: Education details: discussion of plan of care. Community resources handout Person educated: Patient Education method: Explanation Education comprehension: verbalized understanding  HOME EXERCISE PROGRAM: Impact ex's for osteoporosis/osteopenia Access Code: [email or web address] URL: [email or web address] Date: 12/19/2022 Prepared by: Lavinia Sharps  Exercises - Sit to Stand with Arms Crossed  - 1 x daily - 7 x weekly - 2 sets - 5 reps - Heel Raises with Counter Support  - 1 x daily - 7 x weekly - 1 sets - 10 reps - Alternating Step Taps with Counter Support  - 1 x daily - 7 x weekly - 1 sets - 10 reps - Forward Step Over with Counter Support  - 1 x daily - 7 x weekly - 1  sets - 10 reps - Push-Up on Counter  - 1 x daily - 7 x weekly - 1 sets - 10 reps - Standing Bent Over Single Arm Scapular Row with Table Support with PLB  - 1 x daily - 7 x weekly - 1 sets - 10 reps  GOALS: Goals reviewed with patient? Yes  SHORT TERM GOALS: Target date: 12/18/2022  The patient will demonstrate knowledge of basic self care strategies and exercises to promote strength and balance Baseline: Goal status: met 8/29 2.  Improved LE strength needed to rise from a chair with greater ease; 5x STS in 16.5 sec Baseline:  Goal status: INITIAL  3.  The patient will have improved Timed Up and Go (TUG) time to    16.5   sec indicating improved gait speed and LE strength  Baseline:  Goal status: INITIAL  4.  Short Physical Physical Battery score improved by 1 point indicating reduced frailty Baseline:  Goal status: INITIAL     LONG TERM GOALS: Target date: 01/15/2023   The patient will be independent in a safe self progression of a home exercise program to promote further recovery of function  Baseline:  Goal status: INITIAL  2.  The patient will have improved LE strength and balance to squat to pick up an object from the floor or reach overhead with holding onto something Baseline:  Goal status: INITIAL  3.  The patient will have improved Timed Up and Go (TUG) time to  15     sec indicating improved gait speed and LE strength  Baseline:  Goal status: INITIAL  4.  Short Physical Performance Battery score improved by 2 points indicating decreased risk of falls and improved strength Baseline:  Goal status: INITIAL  5.  Activities specific Balance Confidence scale improved to 85 (14 items rather 16) Baseline:  Goal status: INITIAL    ASSESSMENT:  CLINICAL IMPRESSION: Added impact ex's to HEP targeting osteopenic and osteoporotic regions.  Able to increase intensity of ex's today without excessive fatigue with very low rating of perceived exertion end of session.     Verbal cues including forward gaze with ex's to reduce postural strain and load due to excessive cervical flexion.  OBJECTIVE IMPAIRMENTS: decreased activity tolerance, decreased balance, decreased endurance, decreased mobility, difficulty walking, decreased strength, impaired perceived functional ability, impaired UE functional use, and postural dysfunction.   ACTIVITY LIMITATIONS: carrying, lifting, bending, standing, squatting, stairs, reach over head, hygiene/grooming, locomotion level, and caring for others  PARTICIPATION LIMITATIONS: meal prep, cleaning, laundry, interpersonal relationship, driving, shopping, community activity, and yard work  PERSONAL FACTORS: Social background, Time since onset of injury/illness/exacerbation, and 3+ comorbidities: cardiac, HTN, kidney disease  are also affecting patient's functional outcome.  Food and financial insecurity  REHAB POTENTIAL: Good  CLINICAL DECISION MAKING: Evolving/moderate complexity  EVALUATION COMPLEXITY: Moderate  PLAN:  PT FREQUENCY: 2x/week  PT DURATION: 8 weeks  PLANNED INTERVENTIONS: Therapeutic exercises, Therapeutic activity, Neuromuscular re-education, Balance training, Gait training, Patient/Family education, Self Care, Cryotherapy, Moist heat, Manual therapy, and Re-evaluation  PLAN FOR NEXT SESSION:  cervical extension into band;  ERO next week:  5x sit to stand, SPPB and TUG; general strengthening, low level balance; sit to stand; clearing thresholds, reaching/bending down; overhead reaching;  Clock Yourself app 40 spm or slower if no support  Lavinia Sharps, PT 01/10/23 1:17 PM Phone: [phone] Fax: [phone]

## 2023-01-10 NOTE — Patient Instructions (Signed)
   Karne Ozga'S IMPACT EXERCISES: At the counter 1)  Heel drops  10x   femurs/spine     2) stomping     10x   femurs/spine     3) small jumps    10x   femurs/spine     4) push ups on the counter  with push off   radius     5) mountain climbers with weight on hands on counter

## 2023-01-11 ENCOUNTER — Encounter: Payer: Self-pay | Admitting: Pharmacist Clinician (PhC)/ Clinical Pharmacy Specialist

## 2023-01-11 NOTE — Progress Notes (Signed)
  Care Coordination Note  01/11/2023 Name: [name] MRN: [id number] DOB: Sep 18, 1935  Brent Griffith is a 87 y.o. year old male who is a primary care patient of Sharon Seller, NP and is actively engaged with the care management team. I reached out to [name] by phone today to assist with scheduling an initial visit with the BSW  Follow up plan: Telephone appointment with care management team member scheduled for:01/17/23  [facility] Coordination Care Guide  Direct Dial: [phone]

## 2023-01-15 ENCOUNTER — Ambulatory Visit: Payer: Medicare Other | Admitting: Physical Therapy

## 2023-01-15 DIAGNOSIS — R296 Repeated falls: Secondary | ICD-10-CM | POA: Diagnosis not present

## 2023-01-15 DIAGNOSIS — R293 Abnormal posture: Secondary | ICD-10-CM

## 2023-01-15 DIAGNOSIS — M6281 Muscle weakness (generalized): Secondary | ICD-10-CM | POA: Diagnosis not present

## 2023-01-15 NOTE — Therapy (Signed)
OUTPATIENT PHYSICAL THERAPY NEURO PROGRESS NOTE/RECERTIFICATION  Patient Name: Brent Griffith MRN: [id number] DOB:09-21-1935, 87 y.o., male Today's Date: 01/15/2023   PCP: [name] REFERRING PROVIDER: [name]  END OF SESSION:  PT End of Session - 01/15/23 1359     Visit Number 7    Date for PT Re-Evaluation 03/12/23    Authorization Type UHC Medicare    PT Start Time 1400    PT Stop Time 1442    PT Time Calculation (min) 42 min    Activity Tolerance Patient tolerated treatment well             Past Medical History:  Diagnosis Date   Acute gastric ulcer without mention of hemorrhage, perforation, or obstruction    Allergic rhinitis, cause unspecified    Anemia, unspecified    Arthritis    Asthma    Blood transfusion without reported diagnosis    CAD (coronary artery disease)    Diaphragmatic hernia without mention of obstruction or gangrene    Elevated prostate specific antigen (PSA)    Enlarged prostate    Esophageal reflux    Extrinsic asthma, unspecified    Herpes zoster without mention of complication    Hyperlipidemia    Hypersomnia with sleep apnea, unspecified    Impotence of organic origin    Kyphosis    Lumbago    Obstructive sleep apnea (adult) (pediatric)    Pneumonia    hx of years ago    Senile osteoporosis    Trigger finger (acquired)    Tubular adenoma of colon 05/2013   Unspecified essential hypertension    Unspecified sleep apnea    CPAP- settings 4-12    Unspecified vitamin D deficiency    Past Surgical History:  Procedure Laterality Date   CARDIAC CATHETERIZATION  03/04/2009   Dr Dorris Fetch   COLONOSCOPY     CORONARY ARTERY BYPASS GRAFT  2010   CYSTOSCOPY WITH RETROGRADE PYELOGRAM, URETEROSCOPY AND STENT PLACEMENT Right 10/04/2013   Procedure: CYSTOSCOPY WITH RETROGRADE PYELOGRAM,  AND STENT PLACEMENT;  Surgeon: Jerilee Field, MD;  Location: [facility];  Service: Urology;  Laterality: Right;   DENTAL SURGERY N/A     MOLE REMOVAL  1958   chin   ROTATOR CUFF REPAIR Left 04/1998   with bone spur removed, Dr Madelon Lips   TONSILLECTOMY  1945   TRANSURETHRAL RESECTION OF PROSTATE N/A 09/24/2013   Procedure: TRANSURETHRAL RESECTION OF THE PROSTATE (TURP) WITH GYRUS (STAGED RIGHT LATERAL AND MEDIAN LOBE);  Surgeon: Kathi Ludwig, MD;  Location: [facility];  Service: Urology;  Laterality: N/A;   UPPER GI ENDOSCOPY     Patient Active Problem List   Diagnosis Date Noted   CKD (chronic kidney disease) stage 3, GFR 30-59 ml/min (HCC) 07/06/2022   Encounter for therapeutic drug monitoring 10/25/2021   Atrial fibrillation (HCC) 10/25/2021   PVC's (premature ventricular contractions) 05/04/2021   COVID-19 virus infection 02/20/2021   Sinusitis 01/24/2021   New onset atrial fibrillation (HCC) 01/19/2021   AKI (acute kidney injury) (HCC) 01/19/2021   HFrEF (heart failure with reduced ejection fraction) (HCC) 01/19/2021   CAD (coronary artery disease) 10/11/2017   IDA (iron deficiency anemia) 08/31/2016   Seasonal and perennial allergic rhinitis 08/28/2016   Palpitations 09/07/2015   Cough, persistent 07/20/2015   Senile osteoporosis 06/09/2014   Shortness of breath 03/20/2014   Hydronephrosis of right kidney 10/03/2013   Benign prostatic hyperplasia 09/24/2013   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   Dyslipidemia 04/06/2009  CARDIOVASCULAR FUNCTION STUDY, ABNORMAL 03/03/2009   Obstructive sleep apnea 02/04/2009   Essential hypertension 09/02/2008   GERD (gastroesophageal reflux disease) 09/02/2008   PERIODIC LIMB MOVEMENT DISORDER 09/03/2007    ONSET DATE: 6 months  REFERRING DIAG:  W19.XXXD (ICD-10-CM) - Fall, subsequent encounter  M40.203 (ICD-10-CM) - Kyphosis of cervicothoracic region, unspecified kyphosis type    THERAPY DIAG:  Frequent falls;   Rationale for Evaluation and Treatment: Rehabilitation  SUBJECTIVE:                                                                                                                                                                                              SUBJECTIVE STATEMENT:   States he's tired from not sleeping well last night.  He did fine after last visit he reports.    Eval:I've been diagnosed with gastric reflux.  I've lost weight and now I'm down to 107#.  I've gotten weak.  Recently rolled off of the bed recently. Falls are trips mostly.  Tripped up curb at church and hit head.  CT scan normal.  Hit head another time as well when tripped on sheet hanging off the side of the bed.  Had a tree fall and used a saw to cut branches and caused some shoulder/back pain.  C4 CT scan had not changed.  Fatigue more than pain. Gets back brace tomorrow from [employer] in [place] Phone shut off (unable to pay the bill)  Pt accompanied by: significant other [name] in lobby  PERTINENT HISTORY: cardiac disease; osteoporosis; HTN; heart failure; kidney disease Phone shut off (unable to pay the bill) Food insecurity PAIN:  PAIN:  Are you having pain? A little bit in my neck NPRS scale: 1-2/10 Pain location: history of neck/back pain   PRECAUTIONS: Fall     WEIGHT BEARING RESTRICTIONS: No  FALLS: Has patient fallen in last 6 months? Yes. Number of falls 2 but pt able to recount more than 2 falls in subjective  LIVING ENVIRONMENT: Lives with: lives with their spouse Lives in: House/apartment    PLOF: Independent  PATIENT GOALS: get stronger to do things around the house (put up ceiling fan in living room);  I'm worried about falling  (forward direction)  OBJECTIVE:   DIAGNOSTIC FINDINGS: CT scans: head, neck, back normal after falls 9/3 Dexa: Site Region Measured Date Measured Age WHO YA BMD Classification T-score AP Spine L2-L4 (L3) 01/08/2025 85.3 Osteoporosis -3.2 0.819 g/cm2 AP Spine L2-L4 (L3) 01/02/2021 83.1 Low Bone Mass -1.8 0.982 g/cm2   DualFemur Total Mean 01/08/2023 85.1 Low Bone Mass -1.6 0.806 g/cm2 DualFemur Total Mean 01/02/2021  83.1 Normal -  0.7 0.914 g/cm2   Left Forearm Radius 33% 01/08/2023 85.1 Low Bone Mass -1.8 0.816 g/cm2 Left Forearm Radius 33% 01/02/2021 83.1 Low Bone Mass -1.3 0.856 g/cm2   World Health Organization [facility]) criteria for post-menopausal, Caucasian Women: Normal        T-score at or above -1 SD Low Bone Mass T-score between -1 and -2.5 SD Osteoporosis  T-score at or below -2.5 SD  COGNITION: Overall cognitive status: Within functional limits for tasks assessed   POSTURE: very rounded shoulders, forward head, and increased thoracic kyphosis  LOWER EXTREMITY ROM:  WFLs   LOWER EXTREMITY MMT:  grossly 4-/5 (unable to rise from the chair without UE support)  GAIT: Comments: decreased stride length, dec gait speed  FUNCTIONAL TESTS:  Unable to rise without UE assist; 5x sit to stand with UE use mod 18.20 TUG with hands 18.20 unsteady upon return and sits on corner of chair  PATIENT SURVEYS:  ABC scale 80% on 14 questions (eliminated carrying packages on escalator and walking on icy sidewalks)pt notes he needs UE support for reaching to the floor and reaching overhead) 9/10:  85.62   8/14 and 9/10:Short Physical Performance Battery:     Balance:       Side by side stance:  1  points (1)        9/10: 1  Semi -tandem:  0   points (1)                 0  Tandem:    0  points (2)                          0    Gait Speed: (13.1 feet) done 2x take the best time:   7.59 sec =2   points     9/10:  4.14   = 4 points                                           > 8.70 sec=1 pt                                           6.21- 8.70 sec=2 pt                                           4.82-6.20 sec=3 pt                                            < 4.82 = 4 pts    Repeated Chair Stands:   20.62 sec with arms 0   points   (timer stopped when straight on 5th stand)    0 needs UE use                                              If used arms=0 pts                                              >  60  sec=0 pts                                              16.70 or more=1 pt                                               13.70-16.69=2 pts                                               11.20-13.69=3 pts                                               11.9 sec or less=4 pts Total score=    3  points      9/10: 5/10 <10/12 predictive of 1 or more mobility limitations and increased risk of mobility disability 6 or less/12 associated with a higher fall rate                                                 5x sit to stand test with UE use: 23.38 9/10:sit to stand test: able to do 1x without UE use but pushes with legs against chair;  struggles to do without UE/doesn't stand all the way up 17.29;  with UE use:  13.73 9/10: TUG: 13.62 9/10: SPPB  improved by 2 points to 5/10                                                TODAY'S TREATMENT:  DATE:9/10: Nu-Step L4  (green model) 6 min while discussing status Seated 5# chops 10x right/left 5x STS TUG ABC scale SPPB 4 then progressed to 6 inch step up with single support 2 sets of 5 Impact ex's: Standing heel drops 10x Counter push offs 10x Stomping 10x Small jumps 2 sets of 5 holding to the sink basin Trunk extension at the counter encouraging neck extension 10x Standing head retraction against blue band 10x Sled push 40# 25 feet x 2 Pt given food pantry/free meal info RPE 4/10 DATE:9/5: Nu-Step L4  (green model) 6 min while discussing status O2 95% HR 67 Seated 5# chops 10x right/left 5# KB on knee up and over yardstick Bil UE push/over head press seated 5# 10x Seated pink power cord leg press out 10x right/left Seated pink power cord trunk extension 10x Sit to stand from mat table plus cushion and holding 5# KB(unable to rise without hands from mat height) 10x Impact ex's see pt instructions: Standing heel drops 10x Counter push offs 10x Stomping 10x Small jumps 2 sets of 5 holding to the sink basin Weight bearing hands on counter  with mountain climber/marching Les 10x 5# KB single arm dead lifts to knee  level 10x right/left Trunk extension at the counter encouraging neck extension 10x RPE 2/10  DATE:8/29: Nu-Step L3  (green model) 6 min while discussing status O2 95% HR 67 Seated 4# chops 8x right/left LAQ 5# 10x right/left Seated 5# ankle weight hip flexion/abduction 10x right/left Seated 4# external rotation 5x right/left  Bil UE push/over head press seated 4# 10x Seated 5# ankle weights alternating hip flexion/abduction over targets Seated red power cord leg press out 10x  UE pull: seated green band row 10x  Seated UE bil shoulder extension green band 10x Sit to stand from mat table plus cushion (unable to rise without hands from mat height) 10x Standing heel raises 10x Standing hip abduction 10x right/left alternating pattern Standing bicep curls 4# 10x right/left  O2 97 HR 68 4# single arm dead lifts to knee level 10x right/left RPE 3 or 4/10  DATE:8/27: Nu-Step L3  (green model) 6 min while discussing status; decreased intensity per patient request: O2 95% HR 67 Seated 4# chops 8x right/left LAQ 4# 10x right/left Seated 4# ankle weight hip flexion/abduction 10x right/left Bil UE push/over head press seated 4# 10x Seated Dead lift red power cord 10x  UE pull: seated green band row 10x  Abdominals:  chair sit ups  holding 4# weight 10x RPE 8 or 9/10           PATIENT EDUCATION: Education details: discussion of plan of care. Community resources handout Person educated: Patient Education method: Explanation Education comprehension: verbalized understanding  HOME EXERCISE PROGRAM: Impact ex's for osteoporosis/osteopenia Access Code: [email or web address] URL: [email or web address] Date: 12/19/2022 Prepared by: Lavinia Sharps  Exercises - Sit to Stand with Arms Crossed  - 1 x daily - 7 x weekly - 2 sets - 5 reps - Heel Raises with Counter Support  - 1 x daily - 7 x weekly - 1 sets - 10  reps - Alternating Step Taps with Counter Support  - 1 x daily - 7 x weekly - 1 sets - 10 reps - Forward Step Over with Counter Support  - 1 x daily - 7 x weekly - 1 sets - 10 reps - Push-Up on Counter  - 1 x daily - 7 x weekly - 1 sets - 10 reps - Standing Bent Over Single Arm Scapular Row with Table Support with PLB  - 1 x daily - 7 x weekly - 1 sets - 10 reps  GOALS: Goals reviewed with patient? Yes  SHORT TERM GOALS: Target date: 12/18/2022  The patient will demonstrate knowledge of basic self care strategies and exercises to promote strength and balance Baseline: Goal status: met 8/29 2.  Improved LE strength needed to rise from a chair with greater ease; 5x STS in 16.5 sec Baseline:  Goal status: met 9/10  3.  The patient will have improved Timed Up and Go (TUG) time to    16.5   sec indicating improved gait speed and LE strength  Baseline:  Goal status: met 9/10  4.  Short Physical Physical Battery score improved by 1 point indicating reduced frailty Baseline:  Goal status: met 9/10     LONG TERM GOALS: Target date: 03/12/2023   The patient will be independent in a safe self progression of a home exercise program to promote further recovery of function  Baseline:  Goal status: ongoing  2.  The patient will have improved LE strength and balance to squat to pick up an object from the floor or reach overhead with holding onto something  Baseline:  Goal status: met 9/10  3.  The patient will have improved Timed Up and Go (TUG) time to  15     sec indicating improved gait speed and LE strength  Baseline:  Goal status: ongoing  4.  Short Physical Performance Battery score improved by 2 points indicating decreased risk of falls and improved strength Baseline:  Goal status: met 9/10  5.  Activities specific Balance Confidence scale improved to 85 (14 items rather 16) Baseline:  Goal status: met 9/10  6. LE strength to rise from a chair without UE assist ("I'd like to  be able to stand on a ladder eventually") NEW   7. Push electric lawnmower across bumpy yard (especially the side yard) NEW    ASSESSMENT:  CLINICAL IMPRESSION: Much improved SPPB score by 2 points however 6 or less/12 associated with a higher fall rate .  Improved STS speed with UE use.  He is able to do a partial stand without UE use but compensates with backs of the knees pushing against the chair seat.  Much improved confidence with balance per ABC scale but reports low confidence with climbing on a stepladder.  We discussed his strength and balance deficits that make him at high risk for falls with climbing a ladder at this time but he would like this to be a goal for the future.  The patient has progressed with rehab goals despite challenges with food insecurity and illness.   The patient would benefit from a continuation of skilled PT for a further progression of strengthening and functional mobility.  Will continue to update and promote independence in a HEP needed for a return to the highest functional level possible with ADLs.     OBJECTIVE IMPAIRMENTS: decreased activity tolerance, decreased balance, decreased endurance, decreased mobility, difficulty walking, decreased strength, impaired perceived functional ability, impaired UE functional use, and postural dysfunction.   ACTIVITY LIMITATIONS: carrying, lifting, bending, standing, squatting, stairs, reach over head, hygiene/grooming, locomotion level, and caring for others  PARTICIPATION LIMITATIONS: meal prep, cleaning, laundry, interpersonal relationship, driving, shopping, community activity, and yard work  PERSONAL FACTORS: Social background, Time since onset of injury/illness/exacerbation, and 3+ comorbidities: cardiac, HTN, kidney disease  are also affecting patient's functional outcome.  Food and financial insecurity  REHAB POTENTIAL: Good  CLINICAL DECISION MAKING: Evolving/moderate complexity  EVALUATION COMPLEXITY:  Moderate  PLAN:  PT FREQUENCY: 2x/week  PT DURATION: 8 weeks  PLANNED INTERVENTIONS: Therapeutic exercises, Therapeutic activity, Neuromuscular re-education, Balance training, Gait training, Patient/Family education, Self Care, Cryotherapy, Moist heat, Manual therapy, and Re-evaluation  PLAN FOR NEXT SESSION:  cervical extension into band; impact ex; sled push; step ups;  low level balance; sit to stand; clearing thresholds, reaching/bending down; overhead reaching;  Clock Yourself app 40 spm or slower if no support  Lavinia Sharps, PT 01/15/23 7:16 PM Phone: [phone] Fax: [phone]

## 2023-01-16 ENCOUNTER — Ambulatory Visit: Payer: Self-pay

## 2023-01-16 NOTE — Patient Outreach (Signed)
  Care Coordination   01/16/2023 Name: Brent Griffith MRN: [id number] DOB: 11/19/1935   Care Coordination Outreach Attempts:  An unsuccessful telephone outreach was attempted for a scheduled appointment today.  Follow Up Plan:  Additional outreach attempts will be made to offer the patient care coordination information and services.   Encounter Outcome:  No Answer   Care Coordination Interventions:  No, not indicated    Delsa Sale RN BSN CCM [place]  [facility], [facility] Health Nurse Care Coordinator  Direct Dial: [phone] Website: [email or web address]

## 2023-01-17 ENCOUNTER — Ambulatory Visit: Payer: Self-pay

## 2023-01-17 NOTE — Patient Instructions (Signed)
Visit Information  Thank you for taking time to visit with me today. Please don't hesitate to contact me if I can be of assistance to you.   Following are the goals we discussed today:  - Visit local food pantries as needed - Contact DSS for assistance with a food stamp application  If you are experiencing a Mental Health or Behavioral Health Crisis or need someone to talk to, please call 1-800-273-TALK (toll free, 24 hour hotline) go to [facility] [address], [place] [phone]) call 911  Patient verbalizes understanding of instructions and care plan provided today and agrees to view in MyChart. Active MyChart status and patient understanding of how to access instructions and care plan via MyChart confirmed with patient.     No further follow up required: Please contact me as needed.  Bevelyn Ngo, BSW, CDP Social Worker, Certified Dementia Practitioner [facility] Care Management  Care Coordination [phone]

## 2023-01-17 NOTE — Patient Outreach (Signed)
  Care Coordination   Follow Up Visit Note   01/17/2023 Name: Brent Griffith MRN: [id number] DOB: [birth year]  Aloha Brent Griffith is a 87 y.o. year old male who sees Eubanks, Janene Harvey, NP for primary care. I spoke with  [name] by phone today.  What matters to the patients health and wellness today?  Identify resources to assist with financial strain   Goals Addressed             This Visit's Progress    COMPLETED: Care Coordination Activities       Care Coordination Interventions: Determined the patient has difficulty affording the cost of food and is currently without internet access due to becoming behind on payments Education provided on the opportunity to apply for FNS benefits - patient states he has the application but is unsure how to complete it Provided the patient with the address to his local DSS advising they can assist with application completion Encouraged the patient to visit local food pantries to assist with food costs - patient indicates he has a list of local food pantry sites and has visited one or two in the past Advised the patient he is able to visit food pantries more than once; discussed plan for patient to obtain food from food pantries as needed Determined the patients internet has been turned off as he was unable to afford the cost - advised there are no resources to assist with internet costs. Patient declines being behind on any other bills at this time Discussed patient does not have concerns with medication costs and.or adherence at this time Encouraged the patient to contact SW as needed         SDOH assessments and interventions completed:  Yes  SDOH Interventions Today    Flowsheet Row Most Recent Value  SDOH Interventions   Food Insecurity Interventions Other (Comment)  [Patient has a list of food pantry sites and a FNS application. Encouraged to visit pantries and contact DSS for assistance with FNS]  Housing Interventions Intervention Not  Indicated  Transportation Interventions Intervention Not Indicated  Utilities Interventions Intervention Not Indicated  [Pt reports the only bill he is behind on is his internet bill. Advised no resources to assist with this]        Care Coordination Interventions:  Yes, provided   Interventions Today    Flowsheet Row Most Recent Value  Chronic Disease   Chronic disease during today's visit Other  [Financial Strain]  General Interventions   General Interventions Discussed/Reviewed General Interventions Discussed, Community Resources  Nutrition Interventions   Nutrition Discussed/Reviewed Nutrition Discussed  Pharmacy Interventions   Pharmacy Dicussed/Reviewed Pharmacy Topics Discussed  [Determined the patient does not have difficulty affording the cost of medications]        Follow up plan: No further intervention required.   Encounter Outcome:  Patient Visit Completed   Bevelyn Ngo, Kenard Gower, CDP Social Worker, Certified Dementia Practitioner [facility] Care Management  Care Coordination [phone]

## 2023-01-18 ENCOUNTER — Encounter: Payer: Self-pay | Admitting: Cardiology

## 2023-01-18 ENCOUNTER — Ambulatory Visit: Payer: Medicare Other | Attending: Cardiology | Admitting: Cardiology

## 2023-01-18 ENCOUNTER — Ambulatory Visit (INDEPENDENT_AMBULATORY_CARE_PROVIDER_SITE_OTHER): Payer: Medicare Other

## 2023-01-18 VITALS — BP 146/68 | HR 60 | Ht 62.0 in | Wt 120.6 lb

## 2023-01-18 DIAGNOSIS — Z5181 Encounter for therapeutic drug level monitoring: Secondary | ICD-10-CM | POA: Diagnosis not present

## 2023-01-18 DIAGNOSIS — E7800 Pure hypercholesterolemia, unspecified: Secondary | ICD-10-CM | POA: Diagnosis not present

## 2023-01-18 DIAGNOSIS — I2510 Atherosclerotic heart disease of native coronary artery without angina pectoris: Secondary | ICD-10-CM

## 2023-01-18 DIAGNOSIS — I480 Paroxysmal atrial fibrillation: Secondary | ICD-10-CM

## 2023-01-18 DIAGNOSIS — I4891 Unspecified atrial fibrillation: Secondary | ICD-10-CM

## 2023-01-18 DIAGNOSIS — I10 Essential (primary) hypertension: Secondary | ICD-10-CM | POA: Diagnosis not present

## 2023-01-18 DIAGNOSIS — I255 Ischemic cardiomyopathy: Secondary | ICD-10-CM | POA: Diagnosis not present

## 2023-01-18 LAB — POCT INR: INR: 1.8 — AB (ref 2.0–3.0)

## 2023-01-18 NOTE — Patient Instructions (Signed)
Follow-Up: At [facility], you and your health needs are our priority.  As part of our continuing mission to provide you with exceptional heart care, we have created designated Provider Care Teams.  These Care Teams include your primary Cardiologist (physician) and Advanced Practice Providers (APPs -  Physician Assistants and Nurse Practitioners) who all work together to provide you with the care you need, when you need it.  We recommend signing up for the patient portal called "MyChart".  Sign up information is provided on this After Visit Summary.  MyChart is used to connect with patients for Virtual Visits (Telemedicine).  Patients are able to view lab/test results, encounter notes, upcoming appointments, etc.  Non-urgent messages can be sent to your provider as well.   To learn more about what you can do with MyChart, go to [email or web address].    Your next appointment:   6 month(s)  Provider:   Olga Millers, MD

## 2023-01-18 NOTE — Patient Instructions (Signed)
INCREASE TO 1/2 tablet daily, EXCEPT 1 tablet on Mondays, Wednesdays and Fridays.  Stay consistent with boost (Mon, Wed, and Fri)  Recheck INR in 4 weeks  [facility] [phone]

## 2023-01-19 ENCOUNTER — Other Ambulatory Visit: Payer: Self-pay | Admitting: Cardiology

## 2023-01-19 LAB — COMPREHENSIVE METABOLIC PANEL
ALT: 23 IU/L (ref 0–44)
AST: 22 IU/L (ref 0–40)
Albumin: 3.7 g/dL (ref 3.7–4.7)
Alkaline Phosphatase: 59 IU/L (ref 44–121)
BUN/Creatinine Ratio: 27 — ABNORMAL HIGH (ref 10–24)
BUN: 29 mg/dL — ABNORMAL HIGH (ref 8–27)
Bilirubin Total: 0.6 mg/dL (ref 0.0–1.2)
CO2: 25 mmol/L (ref 20–29)
Calcium: 8.9 mg/dL (ref 8.6–10.2)
Chloride: 106 mmol/L (ref 96–106)
Creatinine, Ser: 1.09 mg/dL (ref 0.76–1.27)
Globulin, Total: 2.4 g/dL (ref 1.5–4.5)
Glucose: 50 mg/dL — ABNORMAL LOW (ref 70–99)
Potassium: 4.3 mmol/L (ref 3.5–5.2)
Sodium: 141 mmol/L (ref 134–144)
Total Protein: 6.1 g/dL (ref 6.0–8.5)
eGFR: 67 mL/min/{1.73_m2} (ref >59)

## 2023-01-19 LAB — CBC
Hematocrit: 33.2 % — ABNORMAL LOW (ref 37.5–51.0)
Hemoglobin: 11.9 g/dL — ABNORMAL LOW (ref 13.0–17.7)
MCH: 35.3 pg — ABNORMAL HIGH (ref 26.6–33.0)
MCHC: 35.8 g/dL — ABNORMAL HIGH (ref 31.5–35.7)
MCV: 99 fL — ABNORMAL HIGH (ref 79–97)
Platelets: 198 10*3/uL (ref 150–450)
RBC: 3.37 x10E6/uL — ABNORMAL LOW (ref 4.14–5.80)
RDW: 12 % (ref 11.6–15.4)
WBC: 4.7 10*3/uL (ref 3.4–10.8)

## 2023-01-19 LAB — TSH: TSH: 0.957 u[IU]/mL (ref 0.450–4.500)

## 2023-01-19 LAB — T4, FREE: Free T4: 1.72 ng/dL (ref 0.82–1.77)

## 2023-01-19 LAB — LIPID PANEL
Chol/HDL Ratio: 2 ratio (ref 0.0–5.0)
Cholesterol, Total: 116 mg/dL (ref 100–199)
HDL: 58 mg/dL (ref >39)
LDL Chol Calc (NIH): 46 mg/dL (ref 0–99)
Triglycerides: 49 mg/dL (ref 0–149)
VLDL Cholesterol Cal: 12 mg/dL (ref 5–40)

## 2023-01-23 ENCOUNTER — Telehealth: Payer: Self-pay | Admitting: *Deleted

## 2023-01-23 NOTE — Progress Notes (Signed)
Care Coordination Note  01/23/2023 Name: Brent Griffith MRN: [id number] DOB: 03-05-1936  [name] is a 87 y.o. year old male who is a primary care patient of Sharon Seller, NP and is actively engaged with the care management team. I reached out to [name] by phone today to assist with re-scheduling a follow up visit with the RN Case Manager  Follow up plan: Telephone appointment with care management team member scheduled for:02/11/23  [facility] Coordination Care Guide  Direct Dial: [phone]

## 2023-01-23 NOTE — Progress Notes (Signed)
Care Coordination Note  01/23/2023 Name: Brent Griffith MRN: [id number] DOB: 15-Mar-1936  Brent Griffith is a 87 y.o. year old male who is a primary care patient of Sharon Seller, NP and is actively engaged with the care management team. I reached out to [name] by phone today to assist with re-scheduling a follow up visit with the RN Case Manager  Follow up plan: Unsuccessful telephone outreach attempt made. A HIPAA compliant phone message was left for the patient providing contact information and requesting a return call.   [facility]  Care Coordination Care Guide  Direct Dial: [phone]

## 2023-01-25 ENCOUNTER — Encounter: Payer: Self-pay | Admitting: *Deleted

## 2023-01-29 DIAGNOSIS — E559 Vitamin D deficiency, unspecified: Secondary | ICD-10-CM | POA: Diagnosis not present

## 2023-01-29 DIAGNOSIS — R5383 Other fatigue: Secondary | ICD-10-CM | POA: Diagnosis not present

## 2023-01-29 DIAGNOSIS — M810 Age-related osteoporosis without current pathological fracture: Secondary | ICD-10-CM | POA: Diagnosis not present

## 2023-01-31 ENCOUNTER — Other Ambulatory Visit: Payer: Self-pay

## 2023-01-31 ENCOUNTER — Telehealth: Payer: Self-pay

## 2023-01-31 NOTE — Telephone Encounter (Signed)
Auth Submission: NO AUTH NEEDED Site of care: Site of care: [facility] Payer: UHC medicare Medication & CPT/J Code(s) submitted: Reclast (Zolendronic acid) [id number] Route of submission (phone, fax, portal):  Phone # Fax # Auth type: Buy/Bill PB Units/visits requested: 5mg  x 1 dose Reference number:  Approval from: 01/31/23 to 05/07/23

## 2023-02-04 ENCOUNTER — Telehealth: Payer: Self-pay | Admitting: Gastroenterology

## 2023-02-04 NOTE — Telephone Encounter (Signed)
PT is calling to have a 90 day script for famotidine sent to [facility]. Please advise.

## 2023-02-05 MED ORDER — FAMOTIDINE 20 MG PO TABS: 20.0000 mg | ORAL_TABLET | Freq: Two times a day (BID) | ORAL | 3 refills | Status: DC

## 2023-02-05 NOTE — Telephone Encounter (Signed)
Left patient a message that I sent 90 day supply of Pepcid to [facility] today

## 2023-02-08 ENCOUNTER — Encounter: Payer: Self-pay | Admitting: Cardiology

## 2023-02-08 NOTE — Telephone Encounter (Signed)
This is the order set from [facility]fusion clinic:   Status Priority Start End  0.9 %  sodium chloride infusion Order [id number] - None 02/07/23 -  alteplase (CATHFLO ACTIVASE) injection 2 mg Order [id number] - None 02/07/23 -  heparin lock flush 100 unit/mL Order [id number] - None 02/07/23 -  sodium chloride flush (NS) 0.9 % injection 10 mL Order [id number] - None 02/07/23 -  sodium chloride flush (NS) 0.9 % injection 3 mL Order [id number] - None 02/07/23 -  anticoagulant sodium citrate solution 5 mL Order [id number] - None 02/07/23 -  heparin lock flush 100 unit/mL Order [id number] - None 02/07/23 -  acetaminophen (TYLENOL) tablet 650 mg Order [id number] - None 02/07/23 02/07/23  diphenhydrAMINE (BENADRYL) capsule 25 mg Order [id number] - None 02/07/23 02/07/23  zoledronic acid (RECLAST) injection 5 mg Order [id number] - None 02/07/23 02/07/23  famotidine (PEPCID) IVPB 20 mg premix Order [id number] - None 02/07/23 -  0.9 %  sodium chloride infusion Order [id number] - None 02/07/23 -  methylPREDNISolone sodium succinate (SOLU-MEDROL) 125 mg/2 mL injection 125 mg Order [id number] - None 02/07/23 -  diphenhydrAMINE (BENADRYL) injection 50 mg Order [id number] - None 02/07/23 -  albuterol (VENTOLIN HFA) 108 (90 Base) MCG/ACT inhaler 2 puff Order [id number] - None 02/07/23 -  EPINEPHrine (EPI-PEN) injection 0.3 mg Order [id number]

## 2023-02-08 NOTE — Telephone Encounter (Signed)
Will forward to pharm md to review 

## 2023-02-11 ENCOUNTER — Ambulatory Visit: Payer: Self-pay

## 2023-02-11 NOTE — Patient Outreach (Signed)
Care Coordination   02/11/2023 Name: Brent Griffith MRN: [id number] DOB: 08-18-1935   Care Coordination Outreach Attempts:  An unsuccessful telephone outreach was attempted for a scheduled appointment today.  Follow Up Plan:  Additional outreach attempts will be made to offer the patient care coordination information and services.   Encounter Outcome:  No Answer   Care Coordination Interventions:  No, not indicated    Delsa Sale RN BSN CCM [place]  [facility], [facility] Health Nurse Care Coordinator  Direct Dial: [phone] Website: [email or web address]

## 2023-02-11 NOTE — Patient Instructions (Signed)
Visit Information  Thank you for taking time to visit with me today. Please don't hesitate to contact me if I can be of assistance to you.   Following are the goals we discussed today:   Goals Addressed               This Visit's Progress     Patient Stated     COMPLETED: I would like to participate in the Y exercise program (pt-stated)        Care Coordination Interventions: Evaluation of current treatment plan related to impaired physical mobility and patient's adherence to plan as established by provider Patient unable to participate in the PREP program at this time due to actively working with outpatient PT      Other     To continue to manage Afib without complications        Care Coordination Interventions: Counseled on increased risk of stroke due to Afib and benefits of anticoagulation for stroke prevention Reviewed importance of adherence to anticoagulant exactly as prescribed Advised patient to discuss self monitoring INR at home with provider Counseled on bleeding risk associated with warfarin and importance of self-monitoring for signs/symptoms of bleeding Counseled on importance of regular laboratory monitoring as prescribed Counseled on seeking medical attention after a head injury or if there is blood in the urine/stool      To have no more falls   On track     Care Coordination Interventions: Provided written and verbal education re: potential causes of falls and Fall prevention strategies Reviewed medications and discussed potential side effects of medications such as dizziness and frequent urination Advised patient of importance of notifying provider of falls Assessed for signs and symptoms of orthostatic hypotension Assessed for falls since last encounter Assessed patients knowledge of fall risk prevention secondary to previously provided education Assessed for DME needs, sent in basket message to PCP provider [name] requesting an Rx for a  rollator sent to [facility]         Our next appointment is by telephone on 03/21/23 at 2:00 PM  Please call the care guide team at [phone] if you need to cancel or reschedule your appointment.   If you are experiencing a Mental Health or Behavioral Health Crisis or need someone to talk to, please call 1-800-273-TALK (toll free, 24 hour hotline)  Patient verbalizes understanding of instructions and care plan provided today and agrees to view in MyChart. Active MyChart status and patient understanding of how to access instructions and care plan via MyChart confirmed with patient.     Delsa Sale RN BSN CCM [place]  [facility], [facility], [place] Health Nurse Care Coordinator  Direct Dial: [phone] Website: [email or web address]

## 2023-02-11 NOTE — Telephone Encounter (Signed)
Only concern for cardiac medications is furosemide - both Reclast and furosemide can deplete calcium.  This is usually monitored by whomever is giving the Relclast, as it is much more common with that med.  He should be fine to stay of his furosemide.    Other potential interaction is with PPI (pantoprazole) - please have him check with PCP about whether to d/c that around Reclast treatment

## 2023-02-11 NOTE — Patient Outreach (Signed)
Care Coordination   Follow Up Visit Note   02/11/2023 Name: Brent Griffith MRN: [id number] DOB: 12/03/1935  Brent Griffith is a 87 y.o. year old male who sees Eubanks, Janene Harvey, NP for primary care. I spoke with  [name] by phone today.  What matters to the patients health and wellness today?  Patient would like to obtain a new rollator for help with ambulation and to help avoid falling. He would like to schedule his Reclast infusion.     Goals Addressed               This Visit's Progress     Patient Stated     COMPLETED: I would like to participate in the Y exercise program (pt-stated)        Care Coordination Interventions: Evaluation of current treatment plan related to impaired physical mobility and patient's adherence to plan as established by provider Patient unable to participate in the PREP program at this time due to actively working with outpatient PT      Other     To continue to manage Afib without complications        Care Coordination Interventions: Counseled on increased risk of stroke due to Afib and benefits of anticoagulation for stroke prevention Reviewed importance of adherence to anticoagulant exactly as prescribed Advised patient to discuss self monitoring INR at home with provider Counseled on bleeding risk associated with warfarin and importance of self-monitoring for signs/symptoms of bleeding Counseled on importance of regular laboratory monitoring as prescribed Counseled on seeking medical attention after a head injury or if there is blood in the urine/stool       To have no more falls   On track     Care Coordination Interventions: Provided written and verbal education re: potential causes of falls and Fall prevention strategies Reviewed medications and discussed potential side effects of medications such as dizziness and frequent urination Advised patient of importance of notifying provider of falls Assessed for signs and symptoms of  orthostatic hypotension Assessed for falls since last encounter Assessed patients knowledge of fall risk prevention secondary to previously provided education Assessed for DME needs, sent in basket message to PCP provider [name] requesting an Rx for a rollator sent to [facility]     Interventions Today    Flowsheet Row Most Recent Value  Chronic Disease   Chronic disease during today's visit Atrial Fibrillation (AFib), Other  [impaired physical mobility]  General Interventions   General Interventions Discussed/Reviewed General Interventions Discussed, General Interventions Reviewed, Doctor Visits, Durable Medical Equipment (DME), Communication with  Doctor Visits Discussed/Reviewed Doctor Visits Discussed, Doctor Visits Reviewed, PCP, Specialist  Durable Medical Equipment (DME) Dan Humphreys  Communication with PCP/Specialists  [name]]  Exercise Interventions   Exercise Discussed/Reviewed Physical Activity  Physical Activity Discussed/Reviewed Physical Activity Discussed, Physical Activity Reviewed, Home Exercise Program (HEP)  Education Interventions   Education Provided Provided Education  Provided Verbal Education On When to see the doctor, Exercise, Medication  Pharmacy Interventions   Pharmacy Dicussed/Reviewed Pharmacy Topics Discussed, Pharmacy Topics Reviewed, Medications and their functions  Safety Interventions   Safety Discussed/Reviewed Safety Reviewed, Fall Risk, Home Safety, Safety Discussed  Home Safety Assistive Devices          SDOH assessments and interventions completed:  No     Care Coordination Interventions:  Yes, provided   Follow up plan: Follow up call scheduled for 03/21/23 @2 :00 PM     Encounter Outcome:  Patient  Visit Completed

## 2023-02-13 ENCOUNTER — Other Ambulatory Visit: Payer: Self-pay | Admitting: *Deleted

## 2023-02-13 MED ORDER — MEXILETINE HCL 150 MG PO CAPS: 150.0000 mg | ORAL_CAPSULE | Freq: Two times a day (BID) | ORAL | 2 refills | Status: DC

## 2023-02-14 ENCOUNTER — Ambulatory Visit: Payer: Medicare Other | Attending: Nurse Practitioner | Admitting: Physical Therapy

## 2023-02-14 DIAGNOSIS — R296 Repeated falls: Secondary | ICD-10-CM | POA: Diagnosis not present

## 2023-02-14 DIAGNOSIS — R293 Abnormal posture: Secondary | ICD-10-CM | POA: Diagnosis not present

## 2023-02-14 DIAGNOSIS — M6281 Muscle weakness (generalized): Secondary | ICD-10-CM | POA: Insufficient documentation

## 2023-02-14 NOTE — Therapy (Signed)
OUTPATIENT PHYSICAL THERAPY NEURO PROGRESS NOTE  Patient Name: Brent Griffith MRN: [id number] DOB:[birth year], 87 y.o., male Today's Date: 02/14/2023   PCP: [name] REFERRING PROVIDER: [name]  END OF SESSION:  PT End of Session - 02/14/23 1620     Visit Number 8    Date for PT Re-Evaluation 03/12/23    Authorization Type [employer]    PT Start Time 1616    PT Stop Time 1655    PT Time Calculation (min) 39 min    Activity Tolerance Patient tolerated treatment well             Past Medical History:  Diagnosis Date   Acute gastric ulcer without mention of hemorrhage, perforation, or obstruction    Allergic rhinitis, cause unspecified    Anemia, unspecified    Arthritis    Asthma    Blood transfusion without reported diagnosis    CAD (coronary artery disease)    Diaphragmatic hernia without mention of obstruction or gangrene    Elevated prostate specific antigen (PSA)    Enlarged prostate    Esophageal reflux    Extrinsic asthma, unspecified    Herpes zoster without mention of complication    Hyperlipidemia    Hypersomnia with sleep apnea, unspecified    Impotence of organic origin    Kyphosis    Lumbago    Obstructive sleep apnea (adult) (pediatric)    Pneumonia    hx of years ago    Senile osteoporosis    Trigger finger (acquired)    Tubular adenoma of colon 05/2013   Unspecified essential hypertension    Unspecified sleep apnea    CPAP- settings 4-12    Unspecified vitamin D deficiency    Past Surgical History:  Procedure Laterality Date   CARDIAC CATHETERIZATION  03/04/2009   Dr Dorris Fetch   COLONOSCOPY     CORONARY ARTERY BYPASS GRAFT  2010   CYSTOSCOPY WITH RETROGRADE PYELOGRAM, URETEROSCOPY AND STENT PLACEMENT Right 10/04/2013   Procedure: CYSTOSCOPY WITH RETROGRADE PYELOGRAM,  AND STENT PLACEMENT;  Surgeon: Jerilee Field, MD;  Location: [facility];  Service: Urology;  Laterality: Right;   DENTAL SURGERY N/A    MOLE  REMOVAL  1958   chin   ROTATOR CUFF REPAIR Left 04/1998   with bone spur removed, Dr Madelon Lips   TONSILLECTOMY  1945   TRANSURETHRAL RESECTION OF PROSTATE N/A 09/24/2013   Procedure: TRANSURETHRAL RESECTION OF THE PROSTATE (TURP) WITH GYRUS (STAGED RIGHT LATERAL AND MEDIAN LOBE);  Surgeon: Kathi Ludwig, MD;  Location: [facility];  Service: Urology;  Laterality: N/A;   UPPER GI ENDOSCOPY     Patient Active Problem List   Diagnosis Date Noted   CKD (chronic kidney disease) stage 3, GFR 30-59 ml/min (HCC) 07/06/2022   Encounter for therapeutic drug monitoring 10/25/2021   Atrial fibrillation (HCC) 10/25/2021   PVC's (premature ventricular contractions) 05/04/2021   COVID-19 virus infection 02/20/2021   Sinusitis 01/24/2021   New onset atrial fibrillation (HCC) 01/19/2021   AKI (acute kidney injury) (HCC) 01/19/2021   HFrEF (heart failure with reduced ejection fraction) (HCC) 01/19/2021   CAD (coronary artery disease) 10/11/2017   IDA (iron deficiency anemia) 08/31/2016   Seasonal and perennial allergic rhinitis 08/28/2016   Palpitations 09/07/2015   Cough, persistent 07/20/2015   Senile osteoporosis 06/09/2014   Shortness of breath 03/20/2014   Hydronephrosis of right kidney 10/03/2013   Benign prostatic hyperplasia 09/24/2013   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   Dyslipidemia 04/06/2009  Nonspecific abnormal results of cardiovascular function study 03/03/2009   Obstructive sleep apnea 02/04/2009   Essential hypertension 09/02/2008   GERD (gastroesophageal reflux disease) 09/02/2008   PERIODIC LIMB MOVEMENT DISORDER 09/03/2007    ONSET DATE: 6 months  REFERRING DIAG:  W19.XXXD (ICD-10-CM) - Fall, subsequent encounter  M40.203 (ICD-10-CM) - Kyphosis of cervicothoracic region, unspecified kyphosis type    THERAPY DIAG:  Frequent falls;   Rationale for Evaluation and Treatment: Rehabilitation  SUBJECTIVE:                                                                                                                                                                                              SUBJECTIVE STATEMENT:   Patient returns after a 4 week gap in care.  He presents using a RW which he states makes him feel more secure.  He states he doesn't use it around the house.  He denies recent falls. Reports he's been eating better.    Eval:I've been diagnosed with gastric reflux.  I've lost weight and now I'm down to 107#.  I've gotten weak.  Recently rolled off of the bed recently. Falls are trips mostly.  Tripped up curb at church and hit head.  CT scan normal.  Hit head another time as well when tripped on sheet hanging off the side of the bed.  Had a tree fall and used a saw to cut branches and caused some shoulder/back pain.  C4 CT scan had not changed.  Fatigue more than pain. Gets back brace tomorrow from [employer] in [place] Phone shut off (unable to pay the bill)  Pt accompanied by: significant other [name] in lobby  PERTINENT HISTORY: cardiac disease; osteoporosis; HTN; heart failure; kidney disease Phone shut off (unable to pay the bill) Food insecurity PAIN:  PAIN:  Are you having pain? no NPRS scale: 0 Pain location: history of neck/back pain   PRECAUTIONS: Fall     WEIGHT BEARING RESTRICTIONS: No  FALLS: Has patient fallen in last 6 months? Yes. Number of falls 2 but pt able to recount more than 2 falls in subjective  LIVING ENVIRONMENT: Lives with: lives with their spouse Lives in: House/apartment    PLOF: Independent  PATIENT GOALS: get stronger to do things around the house (put up ceiling fan in living room);  I'm worried about falling  (forward direction)  OBJECTIVE:   DIAGNOSTIC FINDINGS: CT scans: head, neck, back normal after falls 9/3 Dexa: Site Region Measured Date Measured Age WHO YA BMD Classification T-score AP Spine L2-L4 (L3) 01/08/20[age] Osteoporosis -3.2 0.819 g/cm2 AP Spine L2-L4 (L3) 01/02/2023 83.1 Low Bone Mass -  1.8 0.982  g/cm2   DualFemur Total Mean 01/08/2023 85.1 Low Bone Mass -1.6 0.806 g/cm2 DualFemur Total Mean 01/02/2021 83.1 Normal -0.7 0.914 g/cm2   Left Forearm Radius 33% 01/08/2023 85.1 Low Bone Mass -1.8 0.816 g/cm2 Left Forearm Radius 33% 01/02/2021 83.1 Low Bone Mass -1.3 0.856 g/cm2   World Health [facility]) criteria for post-menopausal, Caucasian Women: Normal        T-score at or above -1 SD Low Bone Mass T-score between -1 and -2.5 SD Osteoporosis  T-score at or below -2.5 SD  COGNITION: Overall cognitive status: Within functional limits for tasks assessed   POSTURE: very rounded shoulders, forward head, and increased thoracic kyphosis  LOWER EXTREMITY ROM:  WFLs   LOWER EXTREMITY MMT:  grossly 4-/5 (unable to rise from the chair without UE support)  GAIT: Comments: decreased stride length, dec gait speed  FUNCTIONAL TESTS:  Unable to rise without UE assist; 5x sit to stand with UE use mod 18.20 TUG with hands 18.20 unsteady upon return and sits on corner of chair  PATIENT SURVEYS:  ABC scale 80% on 14 questions (eliminated carrying packages on escalator and walking on icy sidewalks)pt notes he needs UE support for reaching to the floor and reaching overhead) 9/10:  85.62   8/14 and 9/10:Short Physical Performance Battery:     Balance:       Side by side stance:  1  points (1)        9/10: 1  Semi -tandem:  0   points (1)                 0  Tandem:    0  points (2)                          0    Gait Speed: (13.1 feet) done 2x take the best time:   7.59 sec =2   points     9/10:  4.14   = 4 points                                           > 8.70 sec=1 pt                                           6.21- 8.70 sec=2 pt                                           4.82-6.20 sec=3 pt                                            < 4.82 = 4 pts    Repeated Chair Stands:   20.62 sec with arms 0   points   (timer stopped when straight on 5th stand)    0 needs UE use  If used arms=0 pts                                              > 60 sec=0 pts                                              16.70 or more=1 pt                                               13.70-16.69=2 pts                                               11.20-13.69=3 pts                                               11.9 sec or less=4 pts Total score=    3  points      9/10: 5/10 <10/12 predictive of 1 or more mobility limitations and increased risk of mobility disability 6 or less/12 associated with a higher fall rate                                                 5x sit to stand test with UE use: 23.38 9/10:sit to stand test: able to do 1x without UE use but pushes with legs against chair;  struggles to do without UE/doesn't stand all the way up 17.29;  with UE use:  13.73 9/10: TUG: 13.62 9/10: SPPB  improved by 2 points to 5/10  10/10: 846 feet with RW                                                TODAY'S TREATMENT:  DATE:10/10: Nu-Step (blue model) L1  5 min while discussing status  Standing UE alternating punches to wall 10x Standing 6 inch step taps 10x Seated red power cord trunk extension 20x Seated red power cord row 10x right/left 6 inch forward step ups: 5x right/left  6 inch lateral step ups; 5x right/left Railing push ups 10x Seated head retraction against blue band 10x 2 Seated UE elevation with ball at mid thoracic 10x Seated blue band horizontal abduction 10x; diagonals 10x right/left  6 MWT 4,403 step count for the day RPE 5-6/10  DATE:9/10: Nu-Step L4  (green model) 6 min while discussing status Seated 5# chops 10x right/left 5x STS TUG ABC scale SPPB 4 then progressed to 6 inch step up with single support 2 sets of 5 Impact ex's: Standing heel drops 10x Counter push offs 10x Stomping 10x Small jumps 2 sets of 5 holding to  the sink basin Trunk extension at the counter encouraging neck extension 10x Standing head  retraction against blue band 10x Sled push 40# 25 feet x 2 Pt given food pantry/free meal info RPE 4/10 DATE:9/5: Nu-Step L4  (green model) 6 min while discussing status O2 95% HR 67 Seated 5# chops 10x right/left 5# KB on knee up and over yardstick Bil UE push/over head press seated 5# 10x Seated pink power cord leg press out 10x right/left Seated pink power cord trunk extension 10x Sit to stand from mat table plus cushion and holding 5# KB(unable to rise without hands from mat height) 10x Impact ex's see pt instructions: Standing heel drops 10x Counter push offs 10x Stomping 10x Small jumps 2 sets of 5 holding to the sink basin Weight bearing hands on counter with mountain climber/marching Les 10x 5# KB single arm dead lifts to knee level 10x right/left Trunk extension at the counter encouraging neck extension 10x RPE 2/10  DATE:8/29: Nu-Step L3  (green model) 6 min while discussing status O2 95% HR 67 Seated 4# chops 8x right/left LAQ 5# 10x right/left Seated 5# ankle weight hip flexion/abduction 10x right/left Seated 4# external rotation 5x right/left  Bil UE push/over head press seated 4# 10x Seated 5# ankle weights alternating hip flexion/abduction over targets Seated red power cord leg press out 10x  UE pull: seated green band row 10x  Seated UE bil shoulder extension green band 10x Sit to stand from mat table plus cushion (unable to rise without hands from mat height) 10x Standing heel raises 10x Standing hip abduction 10x right/left alternating pattern Standing bicep curls 4# 10x right/left  O2 97 HR 68 4# single arm dead lifts to knee level 10x right/left RPE 3 or 4/10  DATE:8/27: Nu-Step L3  (green model) 6 min while discussing status; decreased intensity per patient request: O2 95% HR 67 Seated 4# chops 8x right/left LAQ 4# 10x right/left Seated 4# ankle weight hip flexion/abduction 10x right/left Bil UE push/over head press seated 4# 10x Seated Dead lift  red power cord 10x  UE pull: seated green band row 10x  Abdominals:  chair sit ups  holding 4# weight 10x RPE 8 or 9/10           PATIENT EDUCATION: Education details: discussion of plan of care. Community resources handout Person educated: Patient Education method: Explanation Education comprehension: verbalized understanding  HOME EXERCISE PROGRAM: Impact ex's for osteoporosis/osteopenia Access Code: [email or web address] URL: [email or web address] Date: 12/19/2022 Prepared by: Lavinia Sharps  Exercises - Sit to Stand with Arms Crossed  - 1 x daily - 7 x weekly - 2 sets - 5 reps - Heel Raises with Counter Support  - 1 x daily - 7 x weekly - 1 sets - 10 reps - Alternating Step Taps with Counter Support  - 1 x daily - 7 x weekly - 1 sets - 10 reps - Forward Step Over with Counter Support  - 1 x daily - 7 x weekly - 1 sets - 10 reps - Push-Up on Counter  - 1 x daily - 7 x weekly - 1 sets - 10 reps - Standing Bent Over Single Arm Scapular Row with Table Support with PLB  - 1 x daily - 7 x weekly - 1 sets - 10 reps  GOALS: Goals reviewed with patient? Yes  SHORT TERM GOALS: Target date: 12/18/2022  The patient will demonstrate knowledge of basic self care strategies and exercises to promote strength and balance Baseline: Goal status: met  8/29 2.  Improved LE strength needed to rise from a chair with greater ease; 5x STS in 16.5 sec Baseline:  Goal status: met 9/10  3.  The patient will have improved Timed Up and Go (TUG) time to    16.5   sec indicating improved gait speed and LE strength  Baseline:  Goal status: met 9/10  4.  Short Physical Physical Battery score improved by 1 point indicating reduced frailty Baseline:  Goal status: met 9/10     LONG TERM GOALS: Target date: 03/12/2023   The patient will be independent in a safe self progression of a home exercise program to promote further recovery of function  Baseline:  Goal status: ongoing  2.  The patient  will have improved LE strength and balance to squat to pick up an object from the floor or reach overhead with holding onto something Baseline:  Goal status: met 9/10  3.  The patient will have improved Timed Up and Go (TUG) time to  15     sec indicating improved gait speed and LE strength  Baseline:  Goal status: ongoing  4.  Short Physical Performance Battery score improved by 2 points indicating decreased risk of falls and improved strength Baseline:  Goal status: met 9/10  5.  Activities specific Balance Confidence scale improved to 85 (14 items rather 16) Baseline:  Goal status: met 9/10  6. LE strength to rise from a chair without UE assist ("I'd like to be able to stand on a ladder eventually") NEW   7. Push electric lawnmower across bumpy yard (especially the side yard) NEW    ASSESSMENT:  CLINICAL IMPRESSION: Much improved gait stability and speed with the RW.  After he completed the 6 MWT today he is pleased that he has more than 4,000 steps today.  Fewer verbal cues needed for upright head position.  He is using an upper back brace with head support which helps with postural alignment and his wife states he is eating with the added postural support.  Therapist monitoring response throughout the treatment session.   Fatigue level appropriate for intensity level of exercise.   OBJECTIVE IMPAIRMENTS: decreased activity tolerance, decreased balance, decreased endurance, decreased mobility, difficulty walking, decreased strength, impaired perceived functional ability, impaired UE functional use, and postural dysfunction.   ACTIVITY LIMITATIONS: carrying, lifting, bending, standing, squatting, stairs, reach over head, hygiene/grooming, locomotion level, and caring for others  PARTICIPATION LIMITATIONS: meal prep, cleaning, laundry, interpersonal relationship, driving, shopping, community activity, and yard work  PERSONAL FACTORS: Social background, Time since onset of  injury/illness/exacerbation, and 3+ comorbidities: cardiac, HTN, kidney disease  are also affecting patient's functional outcome.  Food and financial insecurity  REHAB POTENTIAL: Good  CLINICAL DECISION MAKING: Evolving/moderate complexity  EVALUATION COMPLEXITY: Moderate  PLAN:  PT FREQUENCY: 2x/week  PT DURATION: 8 weeks  PLANNED INTERVENTIONS: Therapeutic exercises, Therapeutic activity, Neuromuscular re-education, Balance training, Gait training, Patient/Family education, Self Care, Cryotherapy, Moist heat, Manual therapy, and Re-evaluation  PLAN FOR NEXT SESSION:  cervical extension into band; impact ex; sled push; step ups;  low level balance; sit to stand; clearing thresholds, reaching/bending down; overhead reaching;  Clock Yourself app 40 spm or slower if no support  Lavinia Sharps, PT 02/14/23 5:30 PM Phone: [phone] Fax: [phone]

## 2023-02-15 ENCOUNTER — Ambulatory Visit: Payer: Medicare Other | Attending: Cardiology | Admitting: *Deleted

## 2023-02-15 DIAGNOSIS — Z5181 Encounter for therapeutic drug level monitoring: Secondary | ICD-10-CM

## 2023-02-15 DIAGNOSIS — I4891 Unspecified atrial fibrillation: Secondary | ICD-10-CM | POA: Diagnosis not present

## 2023-02-15 LAB — POCT INR: INR: 2.8 (ref 2.0–3.0)

## 2023-02-15 NOTE — Patient Instructions (Signed)
Description   Continue taking 1/2 tablet daily, EXCEPT 1 tablet on Mondays, Wednesdays and Fridays.  Stay consistent with boost (Mon, Wed, and Fri)  Recheck INR in 4 weeks. [facility] [phone]

## 2023-02-19 ENCOUNTER — Ambulatory Visit: Payer: Medicare Other

## 2023-02-19 VITALS — BP 106/66 | HR 56 | Temp 97.5°F | Resp 16 | Ht 62.0 in | Wt 120.0 lb

## 2023-02-19 DIAGNOSIS — M810 Age-related osteoporosis without current pathological fracture: Secondary | ICD-10-CM

## 2023-02-19 MED ORDER — ACETAMINOPHEN 325 MG PO TABS
650.0000 mg | ORAL_TABLET | Freq: Once | ORAL | Status: AC
  Administered 2023-02-19: 650 mg via ORAL
  Filled 2023-02-19: qty 2

## 2023-02-19 MED ORDER — DIPHENHYDRAMINE HCL 25 MG PO CAPS
25.0000 mg | ORAL_CAPSULE | Freq: Once | ORAL | Status: AC
  Administered 2023-02-19: 25 mg via ORAL
  Filled 2023-02-19: qty 1

## 2023-02-19 MED ORDER — ZOLEDRONIC ACID 5 MG/100ML IV SOLN
5.0000 mg | Freq: Once | INTRAVENOUS | Status: AC
  Administered 2023-02-19: 5 mg via INTRAVENOUS
  Filled 2023-02-19: qty 100

## 2023-02-19 NOTE — Progress Notes (Signed)
Diagnosis: Osteoporosis  Provider:  Chilton Greathouse MD  Procedure: IV Infusion  IV Type: Peripheral, IV Location: L Forearm  Reclast (Zolendronic Acid), Dose: 5 mg  Infusion Start Time: 1235  Infusion Stop Time: 1305  Post Infusion IV Care: Observation period completed and Peripheral IV Discontinued  Discharge: Condition: Good, Destination: Home . AVS Provided  Performed by:  Nat Math, RN

## 2023-02-19 NOTE — Patient Instructions (Signed)

## 2023-02-20 ENCOUNTER — Ambulatory Visit: Payer: Medicare Other | Admitting: Physical Therapy

## 2023-02-20 DIAGNOSIS — R296 Repeated falls: Secondary | ICD-10-CM | POA: Diagnosis not present

## 2023-02-20 DIAGNOSIS — M6281 Muscle weakness (generalized): Secondary | ICD-10-CM | POA: Diagnosis not present

## 2023-02-20 DIAGNOSIS — R293 Abnormal posture: Secondary | ICD-10-CM | POA: Diagnosis not present

## 2023-02-20 NOTE — Therapy (Signed)
OUTPATIENT PHYSICAL THERAPY NEURO PROGRESS NOTE  Patient Name: Brent Griffith MRN: [id number] DOB:July 19, 1935, 87 y.o., male Today's Date: 02/20/2023   PCP: [name] REFERRING PROVIDER: [name]  END OF SESSION:  PT End of Session - 02/20/23 1356     Visit Number 9    Date for PT Re-Evaluation 03/12/23    Authorization Type [employer] Medicare    PT Start Time 1400    PT Stop Time 1440    PT Time Calculation (min) 40 min    Activity Tolerance Patient tolerated treatment well             Past Medical History:  Diagnosis Date   Acute gastric ulcer without mention of hemorrhage, perforation, or obstruction    Allergic rhinitis, cause unspecified    Anemia, unspecified    Arthritis    Asthma    Blood transfusion without reported diagnosis    CAD (coronary artery disease)    Diaphragmatic hernia without mention of obstruction or gangrene    Elevated prostate specific antigen (PSA)    Enlarged prostate    Esophageal reflux    Extrinsic asthma, unspecified    Herpes zoster without mention of complication    Hyperlipidemia    Hypersomnia with sleep apnea, unspecified    Impotence of organic origin    Kyphosis    Lumbago    Obstructive sleep apnea (adult) (pediatric)    Pneumonia    hx of years ago    Senile osteoporosis    Trigger finger (acquired)    Tubular adenoma of colon 05/2013   Unspecified essential hypertension    Unspecified sleep apnea    CPAP- settings 4-12    Unspecified vitamin D deficiency    Past Surgical History:  Procedure Laterality Date   CARDIAC CATHETERIZATION  03/04/2009   Dr Dorris Fetch   COLONOSCOPY     CORONARY ARTERY BYPASS GRAFT  2010   CYSTOSCOPY WITH RETROGRADE PYELOGRAM, URETEROSCOPY AND STENT PLACEMENT Right 10/04/2013   Procedure: CYSTOSCOPY WITH RETROGRADE PYELOGRAM,  AND STENT PLACEMENT;  Surgeon: Jerilee Field, MD;  Location: [facility];  Service: Urology;  Laterality: Right;   DENTAL SURGERY N/A    MOLE  REMOVAL  1958   chin   ROTATOR CUFF REPAIR Left 04/1998   with bone spur removed, Dr Madelon Lips   TONSILLECTOMY  1945   TRANSURETHRAL RESECTION OF PROSTATE N/A 09/24/2013   Procedure: TRANSURETHRAL RESECTION OF THE PROSTATE (TURP) WITH GYRUS (STAGED RIGHT LATERAL AND MEDIAN LOBE);  Surgeon: Kathi Ludwig, MD;  Location: [facility];  Service: Urology;  Laterality: N/A;   UPPER GI ENDOSCOPY     Patient Active Problem List   Diagnosis Date Noted   CKD (chronic kidney disease) stage 3, GFR 30-59 ml/min (HCC) 07/06/2022   Encounter for therapeutic drug monitoring 10/25/2021   Atrial fibrillation (HCC) 10/25/2021   PVC's (premature ventricular contractions) 05/04/2021   COVID-19 virus infection 02/20/2021   Sinusitis 01/24/2021   New onset atrial fibrillation (HCC) 01/19/2021   AKI (acute kidney injury) (HCC) 01/19/2021   HFrEF (heart failure with reduced ejection fraction) (HCC) 01/19/2021   CAD (coronary artery disease) 10/11/2017   IDA (iron deficiency anemia) 08/31/2016   Seasonal and perennial allergic rhinitis 08/28/2016   Palpitations 09/07/2015   Cough, persistent 07/20/2015   Senile osteoporosis 06/09/2014   Shortness of breath 03/20/2014   Hydronephrosis of right kidney 10/03/2013   Benign prostatic hyperplasia 09/24/2013   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   Dyslipidemia 04/06/2009  Nonspecific abnormal results of cardiovascular function study 03/03/2009   Obstructive sleep apnea 02/04/2009   Essential hypertension 09/02/2008   GERD (gastroesophageal reflux disease) 09/02/2008   PERIODIC LIMB MOVEMENT DISORDER 09/03/2007    ONSET DATE: 6 months  REFERRING DIAG:  W19.XXXD (ICD-10-CM) - Fall, subsequent encounter  M40.203 (ICD-10-CM) - Kyphosis of cervicothoracic region, unspecified kyphosis type    THERAPY DIAG:  Frequent falls;   Rationale for Evaluation and Treatment: Rehabilitation  SUBJECTIVE:                                                                                                                                                                                              SUBJECTIVE STATEMENT:   A little sore in my left shoulder, relieved with Salonpas.  Denies post ex soreness after last visit.     Eval:I've been diagnosed with gastric reflux.  I've lost weight and now I'm down to 107#.  I've gotten weak.  Recently rolled off of the bed recently. Falls are trips mostly.  Tripped up curb at church and hit head.  CT scan normal.  Hit head another time as well when tripped on sheet hanging off the side of the bed.  Had a tree fall and used a saw to cut branches and caused some shoulder/back pain.  C4 CT scan had not changed.  Fatigue more than pain. Gets back brace tomorrow from [employer] in [place] Phone shut off (unable to pay the bill)  Pt accompanied by: significant other [name] in lobby  PERTINENT HISTORY: cardiac disease; osteoporosis; HTN; heart failure; kidney disease Phone shut off (unable to pay the bill) Food insecurity PAIN:  PAIN:  Are you having pain? yes NPRS scale: 1-2 left shoulder Pain location: history of neck/back pain   PRECAUTIONS: Fall     WEIGHT BEARING RESTRICTIONS: No  FALLS: Has patient fallen in last 6 months? Yes. Number of falls 2 but pt able to recount more than 2 falls in subjective  LIVING ENVIRONMENT: Lives with: lives with their spouse Lives in: House/apartment    PLOF: Independent  PATIENT GOALS: get stronger to do things around the house (put up ceiling fan in living room);  I'm worried about falling  (forward direction)  OBJECTIVE:   DIAGNOSTIC FINDINGS: CT scans: head, neck, back normal after falls 9/3 Dexa: Site Region Measured Date Measured Age WHO YA BMD Classification T-score AP Spine L2-L4 (L3) 01/08/2025 85.3 Osteoporosis -3.2 0.819 g/cm2 AP Spine L2-L4 (L3) 01/02/2021 83.1 Low Bone Mass -1.8 0.982 g/cm2   DualFemur Total Mean 01/08/2023 85.1 Low Bone Mass -1.6 0.806 g/cm2 DualFemur Total  Mean 01/02/2021 83.1  Normal -0.7 0.914 g/cm2   Left Forearm Radius 33% 01/08/2023 85.1 Low Bone Mass -1.8 0.816 g/cm2 Left Forearm Radius 33% 01/02/2021 83.1 Low Bone Mass -1.3 0.856 g/cm2   World Health Organization [facility]) criteria for post-menopausal, Caucasian Women: Normal        T-score at or above -1 SD Low Bone Mass T-score between -1 and -2.5 SD Osteoporosis  T-score at or below -2.5 SD  COGNITION: Overall cognitive status: Within functional limits for tasks assessed   POSTURE: very rounded shoulders, forward head, and increased thoracic kyphosis  LOWER EXTREMITY ROM:  WFLs   LOWER EXTREMITY MMT:  grossly 4-/5 (unable to rise from the chair without UE support)  GAIT: Comments: decreased stride length, dec gait speed  FUNCTIONAL TESTS:  Unable to rise without UE assist; 5x sit to stand with UE use mod 18.20 TUG with hands 18.20 unsteady upon return and sits on corner of chair  PATIENT SURVEYS:  ABC scale 80% on 14 questions (eliminated carrying packages on escalator and walking on icy sidewalks)pt notes he needs UE support for reaching to the floor and reaching overhead) 9/10:  85.62   8/14 and 9/10:Short Physical Performance Battery:     Balance:       Side by side stance:  1  points (1)        9/10: 1  Semi -tandem:  0   points (1)                 0  Tandem:    0  points (2)                          0    Gait Speed: (13.1 feet) done 2x take the best time:   7.59 sec =2   points     9/10:  4.14   = 4 points                                           > 8.70 sec=1 pt                                           6.21- 8.70 sec=2 pt                                           4.82-6.20 sec=3 pt                                            < 4.82 = 4 pts    Repeated Chair Stands:   20.62 sec with arms 0   points   (timer stopped when straight on 5th stand)    0 needs UE use                                              If used arms=0 pts                                               >  60 sec=0 pts                                              16.70 or more=1 pt                                               13.70-16.69=2 pts                                               11.20-13.69=3 pts                                               11.9 sec or less=4 pts Total score=    3  points      9/10: 5/10 <10/12 predictive of 1 or more mobility limitations and increased risk of mobility disability 6 or less/12 associated with a higher fall rate                                                 5x sit to stand test with UE use: 23.38 9/10:sit to stand test: able to do 1x without UE use but pushes with legs against chair;  struggles to do without UE/doesn't stand all the way up 17.29;  with UE use:  13.73 9/10: TUG: 13.62 9/10: SPPB  improved by 2 points to 5/10  10/10: 846 feet with RW                                                TODAY'S TREATMENT:  DATE:10/16: Nu-Step (blue model) L3  5 min while discussing status  Dynamic warm up:  backwards, high step, side stepping Standing 6 inch step taps 30 sec Seated red power cord trunk extension 50x 6 inch forward step ups: 10x right/left  6 inch lateral step up and overs; 10x right/left Railing push ups 15x Seated head retraction against blue band 10x 2 Sit to stand chair + cushion holding 5# KB 2 sets of 5 Sled 40# push 40 feet x2 LAQ 4# 10x right/left  Seated hip flexion up and over line 4# ankle weights 10x right/left (very fatiguing per pt report)   DATE:10/10: Nu-Step (blue model) L1  5 min while discussing status  Standing UE alternating punches to wall 10x Standing 6 inch step taps 10x Seated red power cord trunk extension 20x Seated red power cord row 10x right/left 6 inch forward step ups: 5x right/left  6 inch lateral step ups; 5x right/left Railing push ups 10x Seated head retraction against blue band 10x 2 Seated UE elevation with ball at mid thoracic 10x Seated blue band horizontal abduction 10x;  diagonals 10x right/left  6 MWT 4,403 step count for the day RPE 5-6/10  DATE:9/10: Nu-Step L4  (green model) 6 min while discussing status Seated 5# chops 10x right/left 5x STS TUG ABC scale SPPB 4 then progressed to 6 inch step up with single support 2 sets of 5 Impact ex's: Standing heel drops 10x Counter push offs 10x Stomping 10x Small jumps 2 sets of 5 holding to the sink basin Trunk extension at the counter encouraging neck extension 10x Standing head retraction against blue band 10x Sled push 40# 25 feet x 2 Pt given food pantry/free meal info RPE 4/10 DATE:9/5: Nu-Step L4  (green model) 6 min while discussing status O2 95% HR 67 Seated 5# chops 10x right/left 5# KB on knee up and over yardstick Bil UE push/over head press seated 5# 10x Seated pink power cord leg press out 10x right/left Seated pink power cord trunk extension 10x Sit to stand from mat table plus cushion and holding 5# KB(unable to rise without hands from mat height) 10x Impact ex's see pt instructions: Standing heel drops 10x Counter push offs 10x Stomping 10x Small jumps 2 sets of 5 holding to the sink basin Weight bearing hands on counter with mountain climber/marching Les 10x 5# KB single arm dead lifts to knee level 10x right/left Trunk extension at the counter encouraging neck extension 10x RPE 2/10          PATIENT EDUCATION: Education details: discussion of plan of care. Community resources handout Person educated: Patient Education method: Explanation Education comprehension: verbalized understanding  HOME EXERCISE PROGRAM: Impact ex's for osteoporosis/osteopenia Access Code: [email or web address] URL: [email or web address] Date: 12/19/2022 Prepared by: Lavinia Sharps  Exercises - Sit to Stand with Arms Crossed  - 1 x daily - 7 x weekly - 2 sets - 5 reps - Heel Raises with Counter Support  - 1 x daily - 7 x weekly - 1 sets - 10 reps - Alternating Step Taps with Counter Support   - 1 x daily - 7 x weekly - 1 sets - 10 reps - Forward Step Over with Counter Support  - 1 x daily - 7 x weekly - 1 sets - 10 reps - Push-Up on Counter  - 1 x daily - 7 x weekly - 1 sets - 10 reps - Standing Bent Over Single Arm Scapular Row with Table Support with PLB  - 1 x daily - 7 x weekly - 1 sets - 10 reps  GOALS: Goals reviewed with patient? Yes  SHORT TERM GOALS: Target date: 12/18/2022  The patient will demonstrate knowledge of basic self care strategies and exercises to promote strength and balance Baseline: Goal status: met 8/29 2.  Improved LE strength needed to rise from a chair with greater ease; 5x STS in 16.5 sec Baseline:  Goal status: met 9/10  3.  The patient will have improved Timed Up and Go (TUG) time to    16.5   sec indicating improved gait speed and LE strength  Baseline:  Goal status: met 9/10  4.  Short Physical Physical Battery score improved by 1 point indicating reduced frailty Baseline:  Goal status: met 9/10     LONG TERM GOALS: Target date: 03/12/2023   The patient will be independent in a safe self progression of a home exercise program to promote further recovery of function  Baseline:  Goal status: ongoing  2.  The patient will have improved LE strength and balance to squat to pick up an object from the floor or reach overhead with holding onto something Baseline:  Goal status: met  9/10  3.  The patient will have improved Timed Up and Go (TUG) time to  15     sec indicating improved gait speed and LE strength  Baseline:  Goal status: ongoing  4.  Short Physical Performance Battery score improved by 2 points indicating decreased risk of falls and improved strength Baseline:  Goal status: met 9/10  5.  Activities specific Balance Confidence scale improved to 85 (14 items rather 16) Baseline:  Goal status: met 9/10  6. LE strength to rise from a chair without UE assist ("I'd like to be able to stand on a ladder  eventually") NEW   7. Push electric lawnmower across bumpy yard (especially the side yard) NEW    ASSESSMENT:  CLINICAL IMPRESSION: Pain levels remain low allowing for a steady progression of strengthening and balance ex's.  He reports LE and general fatigue at an appropriate level for intensity of exercise.  He requires UE assist to rise from a standard height chair but able to so a slightly elevated seat height with ease.  Therapist providing supervision for safety with standing balance ex and to monitor response.    OBJECTIVE IMPAIRMENTS: decreased activity tolerance, decreased balance, decreased endurance, decreased mobility, difficulty walking, decreased strength, impaired perceived functional ability, impaired UE functional use, and postural dysfunction.   ACTIVITY LIMITATIONS: carrying, lifting, bending, standing, squatting, stairs, reach over head, hygiene/grooming, locomotion level, and caring for others  PARTICIPATION LIMITATIONS: meal prep, cleaning, laundry, interpersonal relationship, driving, shopping, community activity, and yard work  PERSONAL FACTORS: Social background, Time since onset of injury/illness/exacerbation, and 3+ comorbidities: cardiac, HTN, kidney disease  are also affecting patient's functional outcome.  Food and financial insecurity  REHAB POTENTIAL: Good  CLINICAL DECISION MAKING: Evolving/moderate complexity  EVALUATION COMPLEXITY: Moderate  PLAN:  PT FREQUENCY: 2x/week  PT DURATION: 8 weeks  PLANNED INTERVENTIONS: Therapeutic exercises, Therapeutic activity, Neuromuscular re-education, Balance training, Gait training, Patient/Family education, Self Care, Cryotherapy, Moist heat, Manual therapy, and Re-evaluation  PLAN FOR NEXT SESSION:  10th visit progress note; cervical extension into band; impact ex; sled push; step ups;  low level balance; sit to stand; clearing thresholds, reaching/bending down; overhead reaching;  Clock Yourself app 40 spm  or slower if no support  Lavinia Sharps, PT 02/20/23 5:52 PM Phone: [phone] Fax: [phone]

## 2023-02-27 ENCOUNTER — Ambulatory Visit: Payer: Medicare Other | Admitting: Physical Therapy

## 2023-02-27 DIAGNOSIS — R293 Abnormal posture: Secondary | ICD-10-CM | POA: Diagnosis not present

## 2023-02-27 DIAGNOSIS — R296 Repeated falls: Secondary | ICD-10-CM

## 2023-02-27 DIAGNOSIS — M6281 Muscle weakness (generalized): Secondary | ICD-10-CM

## 2023-02-27 NOTE — Therapy (Signed)
OUTPATIENT PHYSICAL THERAPY NEURO PROGRESS NOTE  Patient Name: LYNKOLN WEIGMAN MRN: [id number] DOB:04-Sep-1935, 87 y.o., male Today's Date: 02/27/2023  Progress Note Reporting Period 11/20/22 to 02/27/23  See note below for Objective Data and Assessment of Progress/Goals.      PCP: [name] REFERRING PROVIDER: [name]  END OF SESSION:  PT End of Session - 02/27/23 1355     Visit Number 10    Date for PT Re-Evaluation 03/12/23    Authorization Type [employer] Medicare    PT Start Time 1400    PT Stop Time 1440    PT Time Calculation (min) 40 min    Activity Tolerance Patient tolerated treatment well             Past Medical History:  Diagnosis Date   Acute gastric ulcer without mention of hemorrhage, perforation, or obstruction    Allergic rhinitis, cause unspecified    Anemia, unspecified    Arthritis    Asthma    Blood transfusion without reported diagnosis    CAD (coronary artery disease)    Diaphragmatic hernia without mention of obstruction or gangrene    Elevated prostate specific antigen (PSA)    Enlarged prostate    Esophageal reflux    Extrinsic asthma, unspecified    Herpes zoster without mention of complication    Hyperlipidemia    Hypersomnia with sleep apnea, unspecified    Impotence of organic origin    Kyphosis    Lumbago    Obstructive sleep apnea (adult) (pediatric)    Pneumonia    hx of years ago    Senile osteoporosis    Trigger finger (acquired)    Tubular adenoma of colon 05/2013   Unspecified essential hypertension    Unspecified sleep apnea    CPAP- settings 4-12    Unspecified vitamin D deficiency    Past Surgical History:  Procedure Laterality Date   CARDIAC CATHETERIZATION  03/04/2009   Dr Dorris Fetch   COLONOSCOPY     CORONARY ARTERY BYPASS GRAFT  2010   CYSTOSCOPY WITH RETROGRADE PYELOGRAM, URETEROSCOPY AND STENT PLACEMENT Right 10/04/2013   Procedure: CYSTOSCOPY WITH RETROGRADE PYELOGRAM,  AND STENT  PLACEMENT;  Surgeon: Jerilee Field, MD;  Location: [facility];  Service: Urology;  Laterality: Right;   DENTAL SURGERY N/A    MOLE REMOVAL  1958   chin   ROTATOR CUFF REPAIR Left 04/1998   with bone spur removed, Dr Madelon Lips   TONSILLECTOMY  1945   TRANSURETHRAL RESECTION OF PROSTATE N/A 09/24/2013   Procedure: TRANSURETHRAL RESECTION OF THE PROSTATE (TURP) WITH GYRUS (STAGED RIGHT LATERAL AND MEDIAN LOBE);  Surgeon: Kathi Ludwig, MD;  Location: [facility];  Service: Urology;  Laterality: N/A;   UPPER GI ENDOSCOPY     Patient Active Problem List   Diagnosis Date Noted   CKD (chronic kidney disease) stage 3, GFR 30-59 ml/min (HCC) 07/06/2022   Encounter for therapeutic drug monitoring 10/25/2021   Atrial fibrillation (HCC) 10/25/2021   PVC's (premature ventricular contractions) 05/04/2021   COVID-19 virus infection 02/20/2021   Sinusitis 01/24/2021   New onset atrial fibrillation (HCC) 01/19/2021   AKI (acute kidney injury) (HCC) 01/19/2021   HFrEF (heart failure with reduced ejection fraction) (HCC) 01/19/2021   CAD (coronary artery disease) 10/11/2017   IDA (iron deficiency anemia) 08/31/2016   Seasonal and perennial allergic rhinitis 08/28/2016   Palpitations 09/07/2015   Cough, persistent 07/20/2015   Senile osteoporosis 06/09/2014   Shortness of breath 03/20/2014   Hydronephrosis  of right kidney 10/03/2013   Benign prostatic hyperplasia 09/24/2013   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   Dyslipidemia 04/06/2009   Nonspecific abnormal results of cardiovascular function study 03/03/2009   Obstructive sleep apnea 02/04/2009   Essential hypertension 09/02/2008   GERD (gastroesophageal reflux disease) 09/02/2008   PERIODIC LIMB MOVEMENT DISORDER 09/03/2007    ONSET DATE: 6 months  REFERRING DIAG:  W19.XXXD (ICD-10-CM) - Fall, subsequent encounter  M40.203 (ICD-10-CM) - Kyphosis of cervicothoracic region, unspecified kyphosis type    THERAPY DIAG:  Frequent falls;   Rationale for  Evaluation and Treatment: Rehabilitation  SUBJECTIVE:                                                                                                                                                                                             SUBJECTIVE STATEMENT:   I woke up too late for Bible study.  Had a sandwich for lunch.   Eval:I've been diagnosed with gastric reflux.  I've lost weight and now I'm down to 107#.  I've gotten weak.  Recently rolled off of the bed recently. Falls are trips mostly.  Tripped up curb at church and hit head.  CT scan normal.  Hit head another time as well when tripped on sheet hanging off the side of the bed.  Had a tree fall and used a saw to cut branches and caused some shoulder/back pain.  C4 CT scan had not changed.  Fatigue more than pain. Gets back brace tomorrow from [employer] in [place] Phone shut off (unable to pay the bill)  Pt accompanied by: significant other [name] in lobby  PERTINENT HISTORY: cardiac disease; osteoporosis; HTN; heart failure; kidney disease Phone shut off (unable to pay the bill) Food insecurity PAIN:  PAIN:  Are you having pain? yes NPRS scale: 0 left shoulder feels OK today Pain location: history of neck/back pain   PRECAUTIONS: Fall     WEIGHT BEARING RESTRICTIONS: No  FALLS: Has patient fallen in last 6 months? Yes. Number of falls 2 but pt able to recount more than 2 falls in subjective  LIVING ENVIRONMENT: Lives with: lives with their spouse Lives in: House/apartment    PLOF: Independent  PATIENT GOALS: get stronger to do things around the house (put up ceiling fan in living room);  I'm worried about falling  (forward direction)  OBJECTIVE:   DIAGNOSTIC FINDINGS: CT scans: head, neck, back normal after falls 9/3 Dexa: Site Region Measured Date Measured Age WHO YA BMD Classification T-score AP Spine L2-L4 (L3) 01/08/2025 85.3 Osteoporosis -3.2 0.819 g/cm2 AP Spine L2-L4 (L3) 01/02/2023 83.1 Low Bone Mass -1.8  0.982  g/cm2   DualFemur Total Mean 01/08/2023 85.1 Low Bone Mass -1.6 0.806 g/cm2 DualFemur Total Mean 01/02/2021 83.1 Normal -0.7 0.914 g/cm2   Left Forearm Radius 33% 01/08/2023 85.1 Low Bone Mass -1.8 0.816 g/cm2 Left Forearm Radius 33% 01/02/2021 83.1 Low Bone Mass -1.3 0.856 g/cm2   World Health Organization [facility]) criteria for post-menopausal, Caucasian Women: Normal        T-score at or above -1 SD Low Bone Mass T-score between -1 and -2.5 SD Osteoporosis  T-score at or below -2.5 SD  COGNITION: Overall cognitive status: Within functional limits for tasks assessed   POSTURE: very rounded shoulders, forward head, and increased thoracic kyphosis  LOWER EXTREMITY ROM:  WFLs   LOWER EXTREMITY MMT:  grossly 4-/5 (unable to rise from the chair without UE support)  GAIT: Comments: decreased stride length, dec gait speed  FUNCTIONAL TESTS:  Unable to rise without UE assist; 5x sit to stand with UE use mod 18.20 TUG with hands 18.20 unsteady upon return and sits on corner of chair  PATIENT SURVEYS:  ABC scale 80% on 14 questions (eliminated carrying packages on escalator and walking on icy sidewalks)pt notes he needs UE support for reaching to the floor and reaching overhead) 9/10:  85.62   8/14  9/10   10/23:Short Physical Performance Battery:     Balance:       Side by side stance:  1  points (1)        9/10: 1       10/23: 1, 1, 0=2  Semi -tandem:  0   points (1)                 0  Tandem:    0  points (2)                          0    Gait Speed: (13.1 feet) done 2x take the best time:   7.59 sec =2   points     9/10:  4.14   = 4 points   10/23:= 5.91= 3 points                                           > 8.70 sec=1 pt                                           6.21- 8.70 sec=2 pt                                           4.82-6.20 sec=3 pt                                            < 4.82 = 4 pts    Repeated Chair Stands:   20.62 sec with arms 0   points   (timer  stopped when straight on 5th stand)    0 needs UE use   10/23: 12.83 with arms comes up to full standing  If used arms=0 pts                                              > 60 sec=0 pts                                              16.70 or more=1 pt                                               13.70-16.69=2 pts                                               11.20-13.69=3 pts                                               11.9 sec or less=4 pts Total score=    3  points      9/10: 5/10      10/23:  5/10 <10/12 predictive of 1 or more mobility limitations and increased risk of mobility disability 6 or less/12 associated with a higher fall rate                                                 5x sit to stand test with UE use: 23.38 9/10:sit to stand test: able to do 1x without UE use but pushes with legs against chair;  struggles to do without UE/doesn't stand all the way up 17.29;  with UE use:  13.73 9/10: TUG: 13.62 9/10: SPPB  improved by 2 points to 5/10  10/10: 846 feet with RW 10/23: 5x STS 13 sec SPPB  5/12                                                TODAY'S TREATMENT:  DATE:10/23`: Nu-Step (green model) L3  5:30 min while discussing status  5x STS SPPB Leg press seat 8 70# 25x Railing push ups 15x Lateral step overs 10x 2 arm support needed Forward stepovers single arm support 10x Seated aqua power cord trunk extension 20x 2 sets Standing lat bar: 30# 2 sets of 10 (needs assist to bring down bar and return bar to resting position) 6 inch forward step ups on the stairs: 10x right/left  RPE 3/10   DATE:10/16: Nu-Step (blue model) L3  5 min while discussing status  Dynamic warm up:  backwards, high step, side stepping Standing 6 inch step taps 30 sec Seated red power cord trunk extension 50x 6 inch forward step ups: 10x right/left  6 inch lateral step up and overs; 10x right/left Railing push  ups 15x Seated head retraction  against blue band 10x 2 Sit to stand chair + cushion holding 5# KB 2 sets of 5 Sled 40# push 40 feet x2 LAQ 4# 10x right/left  Seated hip flexion up and over line 4# ankle weights 10x right/left (very fatiguing per pt report)   DATE:10/10: Nu-Step (blue model) L1  5 min while discussing status  Standing UE alternating punches to wall 10x Standing 6 inch step taps 10x Seated red power cord trunk extension 20x Seated red power cord row 10x right/left 6 inch forward step ups: 5x right/left  6 inch lateral step ups; 5x right/left Railing push ups 10x Seated head retraction against blue band 10x 2 Seated UE elevation with ball at mid thoracic 10x Seated blue band horizontal abduction 10x; diagonals 10x right/left  6 MWT 4,403 step count for the day RPE 5-6/10  DATE:9/10: Nu-Step L4  (green model) 6 min while discussing status Seated 5# chops 10x right/left 5x STS TUG ABC scale SPPB 4 then progressed to 6 inch step up with single support 2 sets of 5 Impact ex's: Standing heel drops 10x Counter push offs 10x Stomping 10x Small jumps 2 sets of 5 holding to the sink basin Trunk extension at the counter encouraging neck extension 10x Standing head retraction against blue band 10x Sled push 40# 25 feet x 2 Pt given food pantry/free meal info RPE 4/10  PATIENT EDUCATION: Education details: discussion of plan of care. Community resources handout Person educated: Patient Education method: Explanation Education comprehension: verbalized understanding  HOME EXERCISE PROGRAM: Impact ex's for osteoporosis/osteopenia Access Code: [email or web address] URL: [email or web address] Date: 02/27/2023 Prepared by: Lavinia Sharps  Exercises - Sit to Stand with Arms Crossed  - 1 x daily - 7 x weekly - 2 sets - 5 reps - Heel Raises with Counter Support  - 1 x daily - 7 x weekly - 1 sets - 10 reps - Alternating Step Taps with Counter Support  - 1 x daily - 7 x weekly - 1 sets - 10 reps -  Forward Step Over with Counter Support  - 1 x daily - 7 x weekly - 1 sets - 10 reps - Push-Up on Counter  - 1 x daily - 7 x weekly - 1 sets - 10 reps - Standing Bent Over Single Arm Scapular Row with Table Support with PLB  - 1 x daily - 7 x weekly - 1 sets - 10 reps - stomping  - 1 x daily - 7 x weekly - 1 sets - 10 reps - Forward Step Up with Counter Support  - 1 x daily - 7 x weekly - 1 sets - 10 reps - Seated Shoulder Row with Anchored Resistance  - 1 x daily - 7 x weekly - 3 sets - 10 reps  GOALS: Goals reviewed with patient? Yes  SHORT TERM GOALS: Target date: 12/18/2022  The patient will demonstrate knowledge of basic self care strategies and exercises to promote strength and balance Baseline: Goal status: met 8/29 2.  Improved LE strength needed to rise from a chair with greater ease; 5x STS in 16.5 sec Baseline:  Goal status: met 9/10  3.  The patient will have improved Timed Up and Go (TUG) time to    16.5   sec indicating improved gait speed and LE strength  Baseline:  Goal status: met 9/10  4.  Short Physical Physical Battery score improved by 1 point indicating reduced frailty Baseline:  Goal status: met 9/10  LONG TERM GOALS: Target date: 03/12/2023   The patient will be independent in a safe self progression of a home exercise program to promote further recovery of function  Baseline:  Goal status: ongoing  2.  The patient will have improved LE strength and balance to squat to pick up an object from the floor or reach overhead with holding onto something Baseline:  Goal status: met 9/10  3.  The patient will have improved Timed Up and Go (TUG) time to  15     sec indicating improved gait speed and LE strength  Baseline:  Goal status: ongoing  4.  Short Physical Performance Battery score improved by 2 points indicating decreased risk of falls and improved strength Baseline:  Goal status: met 9/10  5.  Activities specific Balance Confidence scale  improved to 85 (14 items rather 16) Baseline:  Goal status: met 9/10  6. LE strength to rise from a chair without UE assist ("I'd like to be able to stand on a ladder eventually") NEW   7. Push electric lawnmower across bumpy yard (especially the side yard) NEW    ASSESSMENT:  CLINICAL IMPRESSION: Improvement in 5x sit to stand test speed although he still requires UE assist to rise from a standard height chair.  He is still at risk of falls and mobility disability per SPPB test.   He has started using a RW which has improved his balance significantly.  He continues to improve although slower gains secondary to irregular visits due to co-morbidities and other health related appointments.  His appetite has improved which should help with strength gains.  Min assist provided with some equipment assistance and close supervision for safety with standing ex's.   OBJECTIVE IMPAIRMENTS: decreased activity tolerance, decreased balance, decreased endurance, decreased mobility, difficulty walking, decreased strength, impaired perceived functional ability, impaired UE functional use, and postural dysfunction.   ACTIVITY LIMITATIONS: carrying, lifting, bending, standing, squatting, stairs, reach over head, hygiene/grooming, locomotion level, and caring for others  PARTICIPATION LIMITATIONS: meal prep, cleaning, laundry, interpersonal relationship, driving, shopping, community activity, and yard work  PERSONAL FACTORS: Social background, Time since onset of injury/illness/exacerbation, and 3+ comorbidities: cardiac, HTN, kidney disease  are also affecting patient's functional outcome.  Food and financial insecurity  REHAB POTENTIAL: Good  CLINICAL DECISION MAKING: Evolving/moderate complexity  EVALUATION COMPLEXITY: Moderate  PLAN:  PT FREQUENCY: 2x/week  PT DURATION: 8 weeks  PLANNED INTERVENTIONS: Therapeutic exercises, Therapeutic activity, Neuromuscular re-education, Balance training,  Gait training, Patient/Family education, Self Care, Cryotherapy, Moist heat, Manual therapy, and Re-evaluation  PLAN FOR NEXT SESSION:  ABC scale; 6 MWT; cervical extension into band; impact ex; sled push; step ups;  low level balance; sit to stand; clearing thresholds, reaching/bending down; overhead reaching;  Clock Yourself app 40 spm or slower if no support; leg press; lat bar  Lavinia Sharps, PT 02/27/23 5:54 PM Phone: [phone] Fax: [phone]

## 2023-03-01 ENCOUNTER — Telehealth: Payer: Self-pay

## 2023-03-01 ENCOUNTER — Ambulatory Visit: Payer: Self-pay

## 2023-03-01 ENCOUNTER — Other Ambulatory Visit: Payer: Self-pay | Admitting: Nurse Practitioner

## 2023-03-01 DIAGNOSIS — R5381 Other malaise: Secondary | ICD-10-CM

## 2023-03-01 DIAGNOSIS — K219 Gastro-esophageal reflux disease without esophagitis: Secondary | ICD-10-CM

## 2023-03-01 DIAGNOSIS — M40203 Unspecified kyphosis, cervicothoracic region: Secondary | ICD-10-CM

## 2023-03-01 NOTE — Patient Instructions (Signed)
Visit Information  Thank you for taking time to visit with me today. Please don't hesitate to contact me if I can be of assistance to you.   Following are the goals we discussed today:   Goals Addressed             This Visit's Progress    To have no more falls       Care Coordination Interventions: Received inbound call from patient requesting an update regarding an Rx for a rollator previously requested on 02/11/23 Placed outbound call to [facility], spoke with [place] who will send [name] a message requesting an Rx for rollator, requested to please provide the patient's ht/wt and fax to [facility] fax #[phone]        Our next appointment is by telephone on 03/08/23 at 2:00 PM   Please call the care guide team at [phone] if you need to cancel or reschedule your appointment.   If you are experiencing a Mental Health or Behavioral Health Crisis or need someone to talk to, please call 1-800-273-TALK (toll free, 24 hour hotline)  Patient verbalizes understanding of instructions and care plan provided today and agrees to view in MyChart. Active MyChart status and patient understanding of how to access instructions and care plan via MyChart confirmed with patient.     Delsa Sale RN BSN CCM [place]  [facility], [facility] Nurse Care Coordinator  Direct Dial: [phone] Website: [email or web address]

## 2023-03-01 NOTE — Patient Outreach (Signed)
Care Coordination   Follow Up Visit Note   03/01/2023 Name: TIJON ASEL MRN: [id number] DOB: Nov 22, 1935  Aloha Gell Whitmoyer is a 87 y.o. year old male who sees Eubanks, Janene Harvey, NP for primary care. I spoke with  [name] by phone today.  What matters to the patients health and wellness today?  Patient would like to obtain a rollator to help prevent falls.     Goals Addressed             This Visit's Progress    To have no more falls       Care Coordination Interventions: Received inbound call from patient requesting an update regarding an Rx for a rollator previously requested on 02/11/23 Placed outbound call to [facility], spoke with [place] who will send [name] a message requesting an Rx for rollator, requested to please provide the patient's ht/wt and fax to [facility] fax #[phone]    Interventions Today    Flowsheet Row Most Recent Value  General Interventions   General Interventions Discussed/Reviewed Communication with, Durable Medical Equipment (DME)  Durable Medical Equipment (DME) Dan Humphreys  Communication with PCP/Specialists  [name],  [place]oke]         SDOH assessments and interventions completed:  No     Care Coordination Interventions:  Yes, provided   Follow up plan: Follow up call scheduled for 03/08/23 @2 :00 PM     Encounter Outcome:  Patient Visit Completed

## 2023-03-01 NOTE — Telephone Encounter (Signed)
Patient Care coordinator with [facility] Lawanna Kobus Little is calling for a prescription for a Rolator with weight and height included for [facility]. The fax number is [phone].

## 2023-03-04 NOTE — Telephone Encounter (Signed)
Order completed and printed

## 2023-03-05 NOTE — Telephone Encounter (Signed)
Order stamped and given to administrative staff to fax

## 2023-03-06 ENCOUNTER — Encounter: Payer: Medicare Other | Admitting: Physical Therapy

## 2023-03-08 ENCOUNTER — Ambulatory Visit: Payer: Self-pay

## 2023-03-08 ENCOUNTER — Other Ambulatory Visit (HOSPITAL_BASED_OUTPATIENT_CLINIC_OR_DEPARTMENT_OTHER): Payer: Self-pay | Admitting: Cardiology

## 2023-03-08 NOTE — Patient Outreach (Signed)
  Care Coordination   Follow Up Visit Note   03/08/2023 Name: Brent Griffith MRN: [id number] DOB: 28-Feb-1936  Brent Griffith is a 87 y.o. year old male who sees Eubanks, Janene Harvey, NP for primary care. I collaborated with Brent Griffith by phone today.  What matters to the patients Griffith and wellness today?  NA    Goals Addressed             This Visit's Progress    To have no more falls       Care Coordination Interventions: Placed successful outbound call to Brent Griffith, confirmed the order for the rollator was received, however patient will need to pay an outstanding balance of $47 before he can pick up his rollator  Placed unsuccessful call to patient    Interventions Today    Flowsheet Row Most Recent Value  General Interventions   General Interventions Discussed/Reviewed Communication with  Communication with --  [Brent Griffith]          SDOH assessments and interventions completed:  No     Care Coordination Interventions:  Yes, provided   Follow up plan: Follow up call to be scheduled by care guide with patient    Encounter Outcome:  Patient Visit Completed

## 2023-03-13 ENCOUNTER — Ambulatory Visit: Payer: Medicare Other | Attending: Nurse Practitioner | Admitting: Physical Therapy

## 2023-03-13 DIAGNOSIS — M6281 Muscle weakness (generalized): Secondary | ICD-10-CM | POA: Diagnosis not present

## 2023-03-13 DIAGNOSIS — R293 Abnormal posture: Secondary | ICD-10-CM | POA: Insufficient documentation

## 2023-03-13 DIAGNOSIS — R296 Repeated falls: Secondary | ICD-10-CM | POA: Insufficient documentation

## 2023-03-13 NOTE — Therapy (Signed)
OUTPATIENT PHYSICAL THERAPY NEURO PROGRESS NOTE/RECERTIFICATION/DISCHARGE SUMMARY  Patient Name: Brent Griffith MRN: [id number] DOB:11/04/1935, 87 y.o., male Today's Date: 03/13/2023     PCP: [name] REFERRING PROVIDER: [name]  END OF SESSION:  PT End of Session - 03/13/23 1355     Visit Number 11    Date for PT Re-Evaluation 03/13/23    Authorization Type [employer]    PT Start Time 1400    PT Stop Time 1440    PT Time Calculation (min) 40 min    Activity Tolerance Patient tolerated treatment well             Past Medical History:  Diagnosis Date   Acute gastric ulcer without mention of hemorrhage, perforation, or obstruction    Allergic rhinitis, cause unspecified    Anemia, unspecified    Arthritis    Asthma    Blood transfusion without reported diagnosis    CAD (coronary artery disease)    Diaphragmatic hernia without mention of obstruction or gangrene    Elevated prostate specific antigen (PSA)    Enlarged prostate    Esophageal reflux    Extrinsic asthma, unspecified    Herpes zoster without mention of complication    Hyperlipidemia    Hypersomnia with sleep apnea, unspecified    Impotence of organic origin    Kyphosis    Lumbago    Obstructive sleep apnea (adult) (pediatric)    Pneumonia    hx of years ago    Senile osteoporosis    Trigger finger (acquired)    Tubular adenoma of colon 05/2013   Unspecified essential hypertension    Unspecified sleep apnea    CPAP- settings 4-12    Unspecified vitamin D deficiency    Past Surgical History:  Procedure Laterality Date   CARDIAC CATHETERIZATION  03/04/2009   Dr Dorris Fetch   COLONOSCOPY     CORONARY ARTERY BYPASS GRAFT  2010   CYSTOSCOPY WITH RETROGRADE PYELOGRAM, URETEROSCOPY AND STENT PLACEMENT Right 10/04/2013   Procedure: CYSTOSCOPY WITH RETROGRADE PYELOGRAM,  AND STENT PLACEMENT;  Surgeon: Jerilee Field, MD;  Location: [facility];  Service: Urology;  Laterality: Right;    DENTAL SURGERY N/A    MOLE REMOVAL  1958   chin   ROTATOR CUFF REPAIR Left 04/1998   with bone spur removed, Dr Madelon Lips   TONSILLECTOMY  1945   TRANSURETHRAL RESECTION OF PROSTATE N/A 09/24/2013   Procedure: TRANSURETHRAL RESECTION OF THE PROSTATE (TURP) WITH GYRUS (STAGED RIGHT LATERAL AND MEDIAN LOBE);  Surgeon: Kathi Ludwig, MD;  Location: [facility];  Service: Urology;  Laterality: N/A;   UPPER GI ENDOSCOPY     Patient Active Problem List   Diagnosis Date Noted   CKD (chronic kidney disease) stage 3, GFR 30-59 ml/min (HCC) 07/06/2022   Encounter for therapeutic drug monitoring 10/25/2021   Atrial fibrillation (HCC) 10/25/2021   PVC's (premature ventricular contractions) 05/04/2021   COVID-19 virus infection 02/20/2021   Sinusitis 01/24/2021   New onset atrial fibrillation (HCC) 01/19/2021   AKI (acute kidney injury) (HCC) 01/19/2021   HFrEF (heart failure with reduced ejection fraction) (HCC) 01/19/2021   CAD (coronary artery disease) 10/11/2017   IDA (iron deficiency anemia) 08/31/2016   Seasonal and perennial allergic rhinitis 08/28/2016   Palpitations 09/07/2015   Cough, persistent 07/20/2015   Senile osteoporosis 06/09/2014   Shortness of breath 03/20/2014   Hydronephrosis of right kidney 10/03/2013   Benign prostatic hyperplasia 09/24/2013   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   Dyslipidemia  04/06/2009   Nonspecific abnormal results of cardiovascular function study 03/03/2009   Obstructive sleep apnea 02/04/2009   Essential hypertension 09/02/2008   GERD (gastroesophageal reflux disease) 09/02/2008   PERIODIC LIMB MOVEMENT DISORDER 09/03/2007    ONSET DATE: 6 months  REFERRING DIAG:  W19.XXXD (ICD-10-CM) - Fall, subsequent encounter  M40.203 (ICD-10-CM) - Kyphosis of cervicothoracic region, unspecified kyphosis type    THERAPY DIAG:  Frequent falls;   Rationale for Evaluation and Treatment: Rehabilitation  SUBJECTIVE:                                                                                                                                                                                              SUBJECTIVE STATEMENT:   I think I'd like to stop PT for a while.  I think my balance is better.  I can walk farther.  Pt does not have RW with him today.  Getting rollator soon he reports.      PERTINENT HISTORY: cardiac disease; osteoporosis; HTN; heart failure; kidney disease Phone shut off (unable to pay the bill) Food insecurity PAIN:  PAIN:  Are you having pain? yes NPRS scale: 0 left shoulder feels OK today Pain location: history of neck/back pain   PRECAUTIONS: Fall     WEIGHT BEARING RESTRICTIONS: No  FALLS: Has patient fallen in last 6 months? Yes. Number of falls 2 but pt able to recount more than 2 falls in subjective  LIVING ENVIRONMENT: Lives with: lives with their spouse Lives in: House/apartment    PLOF: Independent  PATIENT GOALS: get stronger to do things around the house (put up ceiling fan in living room);  I'm worried about falling  (forward direction)  OBJECTIVE:   DIAGNOSTIC FINDINGS: CT scans: head, neck, back normal after falls 9/3 Dexa: Site Region Measured Date Measured Age WHO YA BMD Classification T-score AP Spine L2-L4 (L3) 01/08/2025 85.3 Osteoporosis -3.2 0.819 g/cm2 AP Spine L2-L4 (L3) 01/02/2023 83.1 Low Bone Mass -1.8 0.982 g/cm2   DualFemur Total Mean 01/08/2023 85.1 Low Bone Mass -1.6 0.806 g/cm2 DualFemur Total Mean 01/02/2021 83.1 Normal -0.7 0.914 g/cm2   Left Forearm Radius 33% 01/08/2023 85.1 Low Bone Mass -1.8 0.816 g/cm2 Left Forearm Radius 33% 01/02/2021 83.1 Low Bone Mass -1.3 0.856 g/cm2   World Health Organization [facility]) criteria for post-menopausal, Caucasian Women: Normal        T-score at or above -1 SD Low Bone Mass T-score between -1 and -2.5 SD Osteoporosis  T-score at or below -2.5 SD  COGNITION: Overall cognitive status: Within functional limits for tasks  assessed   POSTURE: very rounded shoulders, forward head, and  increased thoracic kyphosis  LOWER EXTREMITY ROM:  WFLs   LOWER EXTREMITY MMT:  grossly 4-/5 (unable to rise from the chair without UE support)  GAIT: Comments: decreased stride length, dec gait speed  FUNCTIONAL TESTS:  Unable to rise without UE assist; 5x sit to stand with UE use mod 18.20 TUG with hands 18.20 unsteady upon return and sits on corner of chair  PATIENT SURVEYS:  ABC scale 80% on 14 questions (eliminated carrying packages on escalator and walking on icy sidewalks)pt notes he needs UE support for reaching to the floor and reaching overhead) 9/10:  85.62   8/14  9/10   10/23:Short Physical Performance Battery:     Balance:       Side by side stance:  1  points (1)        9/10: 1       10/23: 1, 1, 0=2  Semi -tandem:  0   points (1)                 0  Tandem:    0  points (2)                          0    Gait Speed: (13.1 feet) done 2x take the best time:   7.59 sec =2   points     9/10:  4.14   = 4 points   10/23:= 5.91= 3 points                                           > 8.70 sec=1 pt                                           6.21- 8.70 sec=2 pt                                           4.82-6.20 sec=3 pt                                            < 4.82 = 4 pts    Repeated Chair Stands:   20.62 sec with arms 0   points   (timer stopped when straight on 5th stand)    0 needs UE use   10/23: 12.83 with arms comes up to full standing                                              If used arms=0 pts                                              > 60 sec=0 pts  16.70 or more=1 pt                                               13.70-16.69=2 pts                                               11.20-13.69=3 pts                                               11.9 sec or less=4 pts Total score=    3  points      9/10: 5/10      10/23:  5/10 <10/12 predictive of 1 or more  mobility limitations and increased risk of mobility disability 6 or less/12 associated with a higher fall rate                                                 5x sit to stand test with UE use: 23.38 9/10:sit to stand test: able to do 1x without UE use but pushes with legs against chair;  struggles to do without UE/doesn't stand all the way up 17.29;  with UE use:  13.73 9/10: TUG: 13.62 9/10: SPPB  improved by 2 points to 5/10  10/10: 846 feet with RW 10/23: 5x STS 13 sec SPPB  5/12  11/6: Needs min assist UE to rise sit to stand ABC scale 80.6% TUG 12.78 with min UE assist to rise                                                  TODAY'S TREATMENT:  DATE:03/13/23`: Nu-Step (green model) L3  5:00 min while discussing status  Heel and toe raises at the bar 20x Lateral step overs low cone on floor 10x Forward step overs low cone on floor single arm support 10x Railing push ups 15x 6 inch forward step ups on the stairs: 10x right/left  ABC scale TUG Updated HEP Leg press seat 8 70# 25x Standing lat bar: 30# 2 sets of 10 (needs assist to bring down bar and return bar to resting position)   DATE:10/23`: Nu-Step (green model) L3  5:30 min while discussing status  5x STS SPPB Leg press seat 8 70# 25x Railing push ups 15x Lateral step overs 10x 2 arm support needed Forward stepovers single arm support 10x Seated aqua power cord trunk extension 20x 2 sets Standing lat bar: 30# 2 sets of 10 (needs assist to bring down bar and return bar to resting position) 6 inch forward step ups on the stairs: 10x right/left  RPE 3/10   DATE:10/16: Nu-Step (blue model) L3  5 min while discussing status  Dynamic warm up:  backwards, high step, side stepping Standing 6 inch step taps 30 sec Seated red power cord trunk extension 50x 6 inch forward step ups:  10x right/left  6 inch lateral step up and overs; 10x right/left Railing push ups 15x Seated head retraction against blue band 10x  2 Sit to stand chair + cushion holding 5# KB 2 sets of 5 Sled 40# push 40 feet x2 LAQ 4# 10x right/left  Seated hip flexion up and over line 4# ankle weights 10x right/left (very fatiguing per pt report)  PATIENT EDUCATION: Education details: discussion of plan of care. Community resources handout Person educated: Patient Education method: Explanation Education comprehension: verbalized understanding  HOME EXERCISE PROGRAM: Impact ex's for osteoporosis/osteopenia Access Code: [email or web address] URL: [email or web address] Date: 03/13/2023 Prepared by: Lavinia Sharps  Exercises - Sit to Stand with Arms Crossed  - 1 x daily - 7 x weekly - 2 sets - 5 reps - Heel Raises with Counter Support  - 1 x daily - 7 x weekly - 1 sets - 10 reps - Alternating Step Taps with Counter Support  - 1 x daily - 7 x weekly - 1 sets - 10 reps - Forward Step Over with Counter Support  - 1 x daily - 7 x weekly - 1 sets - 10 reps - Push-Up on Counter  - 1 x daily - 7 x weekly - 1 sets - 10 reps - Standing Bent Over Single Arm Scapular Row with Table Support with PLB  - 1 x daily - 7 x weekly - 1 sets - 10 reps - stomping  - 1 x daily - 7 x weekly - 1 sets - 10 reps - Forward Step Up with Counter Support  - 1 x daily - 7 x weekly - 1 sets - 10 reps - Seated Shoulder Row with Anchored Resistance  - 1 x daily - 7 x weekly - 2 sets - 10 reps - Standing Hip Abduction with Counter Support  - 1 x daily - 7 x weekly - 1 sets - 10 reps - Single Leg Stance with Support  - 1 x daily - 7 x weekly - 1 sets - 5 reps - Lunge with Counter Support  - 1 x daily - 7 x weekly - 1 sets - 5 reps  GOALS: Goals reviewed with patient? Yes  SHORT TERM GOALS: Target date: 12/18/2022  The patient will demonstrate knowledge of basic self care strategies and exercises to promote strength and balance Baseline: Goal status: met 8/29 2.  Improved LE strength needed to rise from a chair with greater ease; 5x STS in 16.5 sec Baseline:   Goal status: met 9/10  3.  The patient will have improved Timed Up and Go (TUG) time to    16.5   sec indicating improved gait speed and LE strength  Baseline:  Goal status: met 9/10  4.  Short Physical Physical Battery score improved by 1 point indicating reduced frailty Baseline:  Goal status: met 9/10     LONG TERM GOALS: Target date: 03/12/2023   The patient will be independent in a safe self progression of a home exercise program to promote further recovery of function  Baseline:  Goal status: met 11/6  2.  The patient will have improved LE strength and balance to squat to pick up an object from the floor or reach overhead with holding onto something Baseline:  Goal status: met 9/10  3.  The patient will have improved Timed Up and Go (TUG) time to  15     sec indicating improved gait speed and LE strength  Baseline:  Goal status: met 11/6  4.  Short Physical Performance Battery score improved by 2 points indicating decreased risk of falls and improved strength Baseline:  Goal status: met 9/10  5.  Activities specific Balance Confidence scale improved to 85 (14 items rather 16) Baseline:  Goal status: met 9/10  6. LE strength to rise from a chair without UE assist ("I'd like to be able to stand on a ladder eventually") Not met   7. Push electric lawnmower across bumpy yard (especially the side yard) Met 11/6    ASSESSMENT:  CLINICAL IMPRESSION: The patient expresses readiness for discharge from PT and has met the majority of rehab goals, with noted improvements in balance, strength and functional mobility.  A comprehensive HEP has been established and anticipate further improvements over time with regular performance of the program.  Recommend discharge from PT at this time.   OBJECTIVE IMPAIRMENTS: decreased activity tolerance, decreased balance, decreased endurance, decreased mobility, difficulty walking, decreased strength, impaired perceived functional  ability, impaired UE functional use, and postural dysfunction.   ACTIVITY LIMITATIONS: carrying, lifting, bending, standing, squatting, stairs, reach over head, hygiene/grooming, locomotion level, and caring for others  PARTICIPATION LIMITATIONS: meal prep, cleaning, laundry, interpersonal relationship, driving, shopping, community activity, and yard work  PERSONAL FACTORS: Social background, Time since onset of injury/illness/exacerbation, and 3+ comorbidities: cardiac, HTN, kidney disease  are also affecting patient's functional outcome.  Food and financial insecurity  REHAB POTENTIAL: Good  CLINICAL DECISION MAKING: Evolving/moderate complexity  EVALUATION COMPLEXITY: Moderate  PLAN: PHYSICAL THERAPY DISCHARGE SUMMARY  Visits from Start of Care: 11  Current functional level related to goals / functional outcomes: See clinical impressions above   Remaining deficits: As above   Education / Equipment: HEP   Patient agrees to discharge. Patient goals were met. Patient is being discharged due to meeting the stated rehab goals.  Lavinia Sharps, PT 03/13/23 7:20 PM Phone: [phone] Fax: [phone]

## 2023-03-15 ENCOUNTER — Ambulatory Visit: Payer: Medicare Other | Attending: Cardiology | Admitting: *Deleted

## 2023-03-15 DIAGNOSIS — Z5181 Encounter for therapeutic drug level monitoring: Secondary | ICD-10-CM

## 2023-03-15 DIAGNOSIS — I4891 Unspecified atrial fibrillation: Secondary | ICD-10-CM | POA: Diagnosis not present

## 2023-03-15 LAB — POCT INR: INR: 3.1 — AB (ref 2.0–3.0)

## 2023-03-15 NOTE — Patient Instructions (Addendum)
Description   Do not take any warfarin tomorrow then continue taking 1/2 tablet daily, EXCEPT 1 tablet on Mondays, Wednesdays and Fridays.  Stay consistent with boost (Mon, Wed, and Fri)  Recheck INR in 4 weeks. [facility] [phone]

## 2023-03-18 ENCOUNTER — Encounter: Payer: Self-pay | Admitting: Nurse Practitioner

## 2023-03-18 ENCOUNTER — Ambulatory Visit (INDEPENDENT_AMBULATORY_CARE_PROVIDER_SITE_OTHER): Payer: Medicare Other | Admitting: Nurse Practitioner

## 2023-03-18 VITALS — BP 110/64 | HR 62 | Temp 97.1°F | Ht 62.0 in | Wt 121.6 lb

## 2023-03-18 DIAGNOSIS — R3912 Poor urinary stream: Secondary | ICD-10-CM

## 2023-03-18 DIAGNOSIS — I10 Essential (primary) hypertension: Secondary | ICD-10-CM | POA: Diagnosis not present

## 2023-03-18 DIAGNOSIS — M810 Age-related osteoporosis without current pathological fracture: Secondary | ICD-10-CM

## 2023-03-18 DIAGNOSIS — K219 Gastro-esophageal reflux disease without esophagitis: Secondary | ICD-10-CM | POA: Diagnosis not present

## 2023-03-18 DIAGNOSIS — G4733 Obstructive sleep apnea (adult) (pediatric): Secondary | ICD-10-CM | POA: Diagnosis not present

## 2023-03-18 DIAGNOSIS — I480 Paroxysmal atrial fibrillation: Secondary | ICD-10-CM

## 2023-03-18 DIAGNOSIS — I4891 Unspecified atrial fibrillation: Secondary | ICD-10-CM | POA: Diagnosis not present

## 2023-03-18 DIAGNOSIS — M40203 Unspecified kyphosis, cervicothoracic region: Secondary | ICD-10-CM

## 2023-03-18 DIAGNOSIS — R630 Anorexia: Secondary | ICD-10-CM

## 2023-03-18 DIAGNOSIS — N401 Enlarged prostate with lower urinary tract symptoms: Secondary | ICD-10-CM

## 2023-03-18 NOTE — Progress Notes (Unsigned)
Careteam: Patient Care Team: Sharon Seller, NP as PCP - General (Geriatric Medicine) Lewayne Bunting, MD as PCP - Cardiology (Cardiology) Lanier Prude, MD as PCP - Electrophysiology (Cardiology) Melina Fiddler, MD as Consulting Physician (Sports Medicine) Ernesto Rutherford, MD as Referring Physician (Ophthalmology)  PLACE OF SERVICE:  [facility]  Advanced Directive information    Allergies  Allergen Reactions   Compazine [Prochlorperazine Edisylate] Anxiety    Chief Complaint  Patient presents with   Medical Management of Chronic Issues    6 month follow-up. Discuss need for td/tdap and covid booster. Patient concerned about right foot discoloration (seeing foot specialist in [place]). Discuss remeron. Patient would like to discuss ear supplement      HPI: Patient is a 87 y.o. male for routine follow up  Has a rollator that he has ordered but has not come in yet.   Weight of 122 lbs- has not lost any weight since last visit.  He has not been taking Remeron- reports appetite is good Does not like the way it makes him feel- makes him too sleepy.  Will take the night before and will sleep until 2pm the next day. He is hungry every day.   Continues to take protonix and famotidine for GERD- no ongoing symptoms.   No further dysphagia, reports swallowing well.  He is working with PT/ot for back and neck brace  Helps holds his neck upright.   A fib- followed by coumadin clinic for INR.   Continues on iron supplement twice daily  Has not needed lasix - no increase in edema  He is followed by podiatrist in [place].  Reports his toes are discolored- black and blue- unsure if he had an injury. He does not remember falling but wife said he did tripped.  Wife reports he has fallen more than once.  Recently finished PT  Had his water cut off but friend gave them some money for their bills and should be coming in soon.  Has not been able to bath or do  laundry in 5 days.   He is getting reclast through orthopedic.   Bph- has post void dribble. Feels like he does empty  He is followed by urology  OSA_ not using cpap because he missed placed supplies.   Review of Systems:  Review of Systems  Constitutional:  Negative for chills, fever and weight loss.  HENT:  Negative for tinnitus.   Respiratory:  Negative for cough, sputum production and shortness of breath.   Cardiovascular:  Negative for chest pain, palpitations and leg swelling.  Gastrointestinal:  Negative for abdominal pain, constipation, diarrhea and heartburn.  Genitourinary:  Negative for dysuria, frequency and urgency.  Musculoskeletal:  Positive for back pain and myalgias. Negative for falls and joint pain.  Skin: Negative.   Neurological:  Negative for dizziness and headaches.  Psychiatric/Behavioral:  Negative for depression and memory loss. The patient does not have insomnia.   ***  Past Medical History:  Diagnosis Date   Acute gastric ulcer without mention of hemorrhage, perforation, or obstruction    Allergic rhinitis, cause unspecified    Anemia, unspecified    Arthritis    Asthma    Blood transfusion without reported diagnosis    CAD (coronary artery disease)    Diaphragmatic hernia without mention of obstruction or gangrene    Elevated prostate specific antigen (PSA)    Enlarged prostate    Esophageal reflux    Extrinsic asthma, unspecified  Herpes zoster without mention of complication    Hyperlipidemia    Hypersomnia with sleep apnea, unspecified    Impotence of organic origin    Kyphosis    Lumbago    Obstructive sleep apnea (adult) (pediatric)    Pneumonia    hx of years ago    Senile osteoporosis    Trigger finger (acquired)    Tubular adenoma of colon 05/2013   Unspecified essential hypertension    Unspecified sleep apnea    CPAP- settings 4-12    Unspecified vitamin D deficiency    Past Surgical History:  Procedure Laterality Date    CARDIAC CATHETERIZATION  03/04/2009   Dr Dorris Fetch   COLONOSCOPY     CORONARY ARTERY BYPASS GRAFT  2010   CYSTOSCOPY WITH RETROGRADE PYELOGRAM, URETEROSCOPY AND STENT PLACEMENT Right 10/04/2013   Procedure: CYSTOSCOPY WITH RETROGRADE PYELOGRAM,  AND STENT PLACEMENT;  Surgeon: Jerilee Field, MD;  Location: [facility];  Service: Urology;  Laterality: Right;   DENTAL SURGERY N/A    MOLE REMOVAL  1958   chin   ROTATOR CUFF REPAIR Left 04/1998   with bone spur removed, Dr Madelon Lips   TONSILLECTOMY  1945   TRANSURETHRAL RESECTION OF PROSTATE N/A 09/24/2013   Procedure: TRANSURETHRAL RESECTION OF THE PROSTATE (TURP) WITH GYRUS (STAGED RIGHT LATERAL AND MEDIAN LOBE);  Surgeon: Kathi Ludwig, MD;  Location: [facility];  Service: Urology;  Laterality: N/A;   UPPER GI ENDOSCOPY     Social History:   reports that he has never smoked. He has never been exposed to tobacco smoke. He has never used smokeless tobacco. He reports that he does not drink alcohol and does not use drugs.  Family History  Problem Relation Age of Onset   Stroke Mother    Breast cancer Mother    Cirrhosis Mother        wine   Rheumatic fever Mother    Kyphosis Mother    Hypertension Father    Stroke Father    Heart disease Father    Heart disease Paternal Grandfather    Colon cancer Neg Hx     Medications: Patient's Medications  New Prescriptions   No medications on file  Previous Medications   ALBUTEROL (VENTOLIN HFA) 108 (90 BASE) MCG/ACT INHALER    Inhale 1-2 puffs into the lungs every 6 (six) hours as needed for wheezing or shortness of breath.   ALUM & MAG HYDROXIDE-SIMETH (MAALOX MAX) 400-400-40 MG/5ML SUSPENSION    Take 10 mLs by mouth every 6 (six) hours as needed (pain with swallowing and reflux).   AMIODARONE (PACERONE) 200 MG TABLET    TAKE 1 TABLET BY MOUTH DAILY   CALCIUM CITRATE-VITAMIN D (EQ CALCIUM CITRATE+D3 PO)    Take 1,200 mg by mouth daily.   CARVEDILOL (COREG) 3.125 MG TABLET    TAKE 1  TABLET BY MOUTH TWICE  DAILY   CHOLECALCIFEROL (VITAMIN D-3) 5000 UNITS TABS    Take 10,000 Units by mouth daily.   COENZYME Q10 (COQ10) 200 MG CAPS    Take 200 mg by mouth daily.   DOXAZOSIN (CARDURA) 2 MG TABLET    TAKE 1 TABLET BY MOUTH DAILY   EMPAGLIFLOZIN (JARDIANCE) 10 MG TABS TABLET    Take 1 tablet (10 mg total) by mouth daily before breakfast.   FAMOTIDINE (PEPCID) 20 MG TABLET    Take 1 tablet (20 mg total) by mouth 2 (two) times daily.   FEXOFENADINE (ALLEGRA) 180 MG TABLET    Take 180  mg by mouth daily as needed for allergies.   FINASTERIDE (PROSCAR) 5 MG TABLET    TAKE 1 TABLET BY MOUTH IN  THE MORNING   FUROSEMIDE (LASIX) 20 MG TABLET    TAKE 1 TABLET BY MOUTH DAILY AS  NEEDED FOR LEG SWELLING   HYDROCODONE-ACETAMINOPHEN (NORCO/VICODIN) 5-325 MG TABLET    Take 1 tablet by mouth every 6 (six) hours as needed for moderate pain.   IRON, FERROUS SULFATE, 325 (65 FE) MG TABS    Take 325 mg by mouth 2 (two) times daily.   LOSARTAN (COZAAR) 25 MG TABLET    Take 1 tablet (25 mg total) by mouth daily. Please keep scheduled appointment with cardiologist   MEXILETINE (MEXITIL) 150 MG CAPSULE    Take 1 capsule (150 mg total) by mouth 2 (two) times daily.   MIRTAZAPINE (REMERON) 7.5 MG TABLET    Take 1 tablet (7.5 mg total) by mouth at bedtime.   PANTOPRAZOLE (PROTONIX) 40 MG TABLET    TAKE 1 TABLET BY MOUTH DAILY   ROSUVASTATIN (CRESTOR) 20 MG TABLET    TAKE 1 TABLET BY MOUTH ONCE DAILY FOR CHOLESTEROL   UNABLE TO FIND    Take 1 tablet by mouth daily. Med Name: Inner ear health plus   WARFARIN (COUMADIN) 2.5 MG TABLET    TAKE 1/2 TO 1 TABLET BY MOUTH  DAILY AS DIRECTED BY THE  COUMADIN CLINIC  Modified Medications   No medications on file  Discontinued Medications   No medications on file    Physical Exam:  Vitals:   03/18/23 1359  BP: 110/64  Pulse: 62  Temp: (!) 97.1 F (36.2 C)  TempSrc: Temporal  SpO2: 97%  Weight: 121 lb 9.6 oz (55.2 kg)  Height: 5\' 2"  (1.575 m)   Body  mass index is 22.24 kg/m. Wt Readings from Last 3 Encounters:  03/18/23 121 lb 9.6 oz (55.2 kg)  02/19/23 120 lb (54.4 kg)  01/18/23 120 lb 9.6 oz (54.7 kg)    Physical Exam***  Labs reviewed: Basic Metabolic Panel: Recent Labs    08/31/22 1515 10/21/22 0149 10/22/22 1557 01/18/23 1450  NA 138 137 139 141  K 3.9 4.3 4.4 4.3  CL 103 105 104 106  CO2 23 23 28 25   GLUCOSE 128* 111* 89 50*  BUN 25 30* 34* 29*  CREATININE 1.16 1.70* 1.36* 1.09  CALCIUM 8.8 9.5 9.3 8.9  TSH 1.200  --   --  0.957   Liver Function Tests: Recent Labs    08/31/22 1515 01/18/23 1450  AST 27 22  ALT 23 23  ALKPHOS 59 59  BILITOT 1.0 0.6  PROT 5.8* 6.1  ALBUMIN 4.0 3.7   No results for input(s): "LIPASE", "AMYLASE" in the last 8760 hours. No results for input(s): "AMMONIA" in the last 8760 hours. CBC: Recent Labs    09/14/22 1516 10/21/22 0149 10/22/22 1557 01/18/23 1450  WBC 4.9 6.2 9.8 4.7  NEUTROABS 3,548 3.9 7,820*  --   HGB 12.2* 12.7* 12.6* 11.9*  HCT 36.0* 37.9* 36.8* 33.2*  MCV 94.7 92.0 92.9 99*  PLT 161 217 235 198   Lipid Panel: Recent Labs    01/18/23 1450  CHOL 116  HDL 58  LDLCALC 46  TRIG 49  CHOLHDL 2.0   TSH: Recent Labs    08/31/22 1515 01/18/23 1450  TSH 1.200 0.957   A1C: Lab Results  Component Value Date   HGBA1C 5.6 07/28/2012     Assessment/Plan 1. Kyphosis  of cervicothoracic region, unspecified kyphosis type ***  2. Gastroesophageal reflux disease without esophagitis ***  3. Essential hypertension ***  4. Atrial fibrillation, unspecified type (HCC) ***  5. Poor appetite ***  6. Gastroesophageal reflux disease, unspecified whether esophagitis present ***  7. Benign prostatic hyperplasia with weak urinary stream ***  8. Senile osteoporosis ***  9. OSA (obstructive sleep apnea) ***   No follow-ups on file.: ***  Shariq Puig K. Biagio Borg [facility] [phone]

## 2023-03-20 ENCOUNTER — Encounter: Payer: Self-pay | Admitting: Nurse Practitioner

## 2023-03-21 ENCOUNTER — Encounter: Payer: Self-pay | Admitting: Podiatry

## 2023-03-21 ENCOUNTER — Ambulatory Visit: Payer: Medicare Other | Admitting: Podiatry

## 2023-03-21 ENCOUNTER — Ambulatory Visit: Payer: Self-pay

## 2023-03-21 DIAGNOSIS — B351 Tinea unguium: Secondary | ICD-10-CM | POA: Diagnosis not present

## 2023-03-21 DIAGNOSIS — Z7901 Long term (current) use of anticoagulants: Secondary | ICD-10-CM | POA: Diagnosis not present

## 2023-03-21 DIAGNOSIS — M79675 Pain in left toe(s): Secondary | ICD-10-CM

## 2023-03-21 DIAGNOSIS — M79674 Pain in right toe(s): Secondary | ICD-10-CM | POA: Diagnosis not present

## 2023-03-21 NOTE — Progress Notes (Signed)
  Subjective:  Patient ID: Brent Griffith, male    DOB: Aug 30, 1935,   MRN: [id number]  Chief Complaint  Patient presents with   Toe Pain    PT PRESENTS FOR TOE NAIL DISCOLORATION AND LONG TOENAILS THAT     87 y.o. male presents for concern of discoloration of his foot. Recently had a fall and wanted to check on the bruising on the top of his foot. Also concern of thickened elongated and painful nails that are difficult to trim. Requesting to have them trimmed today. He is on coumadin and at risk for foot care.   PCP:  Sharon Seller, NP   .  Marland KitchenHe is on coumadin.  Denies any other pedal complaints. Denies n/v/f/c.   Past Medical History:  Diagnosis Date   Acute gastric ulcer without mention of hemorrhage, perforation, or obstruction    Allergic rhinitis, cause unspecified    Anemia, unspecified    Arthritis    Asthma    Blood transfusion without reported diagnosis    CAD (coronary artery disease)    Diaphragmatic hernia without mention of obstruction or gangrene    Elevated prostate specific antigen (PSA)    Enlarged prostate    Esophageal reflux    Extrinsic asthma, unspecified    Herpes zoster without mention of complication    Hyperlipidemia    Hypersomnia with sleep apnea, unspecified    Impotence of organic origin    Kyphosis    Lumbago    Obstructive sleep apnea (adult) (pediatric)    Pneumonia    hx of years ago    Senile osteoporosis    Trigger finger (acquired)    Tubular adenoma of colon 05/2013   Unspecified essential hypertension    Unspecified sleep apnea    CPAP- settings 4-12    Unspecified vitamin D deficiency     Objective:  Physical Exam: Vascular: DP/PT pulses 2/4 bilateral. CFT <3 seconds. Normal hair growth on digits. No edema.  Skin. No lacerations or abrasions bilateral feet. Nails 1-5 bilateral are thickened elongated and dsytrophic with subungual debris. Ecchymosis noted to dorsum of right foot around sulcus of toes. Appears to be improving  from pictures.  Musculoskeletal: MMT 5/5 bilateral lower extremities in DF, PF, Inversion and Eversion. Deceased ROM in DF of ankle joint.  Neurological: Sensation intact to light touch.   Assessment:   1. Pain due to onychomycosis of toenails of both feet   2. Chronic anticoagulation      Plan:  Patient was evaluated and treated and all questions answered. -No hematoma bruising appears to be healing.  -Mechanically debrided all nails 1-5 bilateral using sterile nail nipper and filed with dremel without incident  -Answered all patient questions -Patient to return  in 3 months for at risk foot care -Patient advised to call the office if any problems or questions arise in the meantime.   Louann Sjogren, DPM

## 2023-03-21 NOTE — Patient Outreach (Signed)
  Care Coordination   03/21/2023 Name: Brent Griffith MRN: [id number] DOB: 1935/09/24   Care Coordination Outreach Attempts:  An unsuccessful telephone outreach was attempted for a scheduled appointment today.  Follow Up Plan:  Additional outreach attempts will be made to offer the patient care coordination information and services.   Encounter Outcome:  No Answer   Care Coordination Interventions:  No, not indicated    Delsa Sale RN BSN CCM [facility], [facility] Nurse Care Coordinator  Direct Dial: [phone] Website: [email or web address]

## 2023-04-03 ENCOUNTER — Other Ambulatory Visit: Payer: Self-pay | Admitting: Adult Health

## 2023-04-03 DIAGNOSIS — R630 Anorexia: Secondary | ICD-10-CM

## 2023-04-12 ENCOUNTER — Ambulatory Visit: Payer: Medicare Other | Attending: Cardiology | Admitting: *Deleted

## 2023-04-12 ENCOUNTER — Other Ambulatory Visit: Payer: Self-pay | Admitting: Cardiology

## 2023-04-12 DIAGNOSIS — Z5181 Encounter for therapeutic drug level monitoring: Secondary | ICD-10-CM | POA: Diagnosis not present

## 2023-04-12 DIAGNOSIS — I4891 Unspecified atrial fibrillation: Secondary | ICD-10-CM | POA: Diagnosis not present

## 2023-04-12 LAB — POCT INR: INR: 3.8 — AB (ref 2.0–3.0)

## 2023-04-12 NOTE — Patient Instructions (Signed)
Description   Do not take any warfarin tomorrow then START taking 1/2 tablet daily, EXCEPT 1 tablet on Mondays and Fridays.  Stay consistent with boost (Mon, Wed, and Fri)  Recheck INR in 4 weeks. Coumadin Clinic [phone]

## 2023-04-15 ENCOUNTER — Encounter: Payer: Self-pay | Admitting: Primary Care

## 2023-04-16 ENCOUNTER — Telehealth: Payer: Self-pay | Admitting: Cardiology

## 2023-04-16 MED ORDER — MEXILETINE HCL 150 MG PO CAPS: 150.0000 mg | ORAL_CAPSULE | Freq: Two times a day (BID) | ORAL | 2 refills | Status: DC

## 2023-04-16 NOTE — Telephone Encounter (Signed)
Spoke with pt. Mexiletine 150 mg was sent to pts pharmacy.

## 2023-04-16 NOTE — Telephone Encounter (Signed)
*  STAT* If patient is at the pharmacy, call can be transferred to refill team.   1. Which medications need to be refilled? (please list name of each medication and dose if known)   mexiletine (MEXITIL) 150 MG capsule    2. Which pharmacy/location (including street and city if local pharmacy) is medication to be sent to? [id number] - [place], [place] - [address] Phone: [phone]  Fax: [phone]      3. Do they need a 30 day or 90 day supply? 90

## 2023-04-18 ENCOUNTER — Telehealth: Payer: Self-pay | Admitting: Cardiology

## 2023-04-18 NOTE — Telephone Encounter (Signed)
Pt c/o BP issue: STAT if pt c/o blurred vision, one-sided weakness or slurred speech  1. What are your last 5 BP readings? 136/70 178/55, 147/69, 152/75, 178/67  2. Are you having any other symptoms (ex. Dizziness, headache, blurred vision, passed out)? No symptoms  3. What is your BP issue? Blood pressure is high for him

## 2023-04-18 NOTE — Telephone Encounter (Signed)
Patient identification verified by 2 forms. Marilynn Rail, RN    Called and spoke to patient  Patient states:   -BP has been elevated at home   -thinks BP machine may not be working   -does not have another BP machine to use at home   -Plans to check at [facility] pharmacy   -Takes Carverdilol 3.125 twice daily and losartan 25mg  daily  Patient denies:   -headaches   -chest pressure/pain   -SOB/difficulty breathing  Advised to present to pharmacy as planned to check BP and outreach with updates  Patient has no further questions at this time

## 2023-04-18 NOTE — Telephone Encounter (Signed)
 Attempted to call patient, no answer left message requesting a call back.

## 2023-04-19 ENCOUNTER — Other Ambulatory Visit: Payer: Self-pay | Admitting: Adult Health

## 2023-04-19 DIAGNOSIS — R630 Anorexia: Secondary | ICD-10-CM

## 2023-04-19 NOTE — Telephone Encounter (Signed)
Patient request refill for medication not on list. Medication pend and sent to PCP Janyth Contes Janene Harvey, NP

## 2023-04-19 NOTE — Telephone Encounter (Signed)
Pt is not taking medication at this time

## 2023-04-20 ENCOUNTER — Other Ambulatory Visit: Payer: Self-pay | Admitting: Nurse Practitioner

## 2023-04-23 ENCOUNTER — Ambulatory Visit: Payer: Medicare Other | Admitting: Adult Health

## 2023-04-23 ENCOUNTER — Other Ambulatory Visit: Payer: Self-pay | Admitting: Cardiology

## 2023-04-23 ENCOUNTER — Encounter: Payer: Self-pay | Admitting: Adult Health

## 2023-04-23 VITALS — BP 124/60 | HR 60 | Ht 62.0 in | Wt 125.6 lb

## 2023-04-23 DIAGNOSIS — I255 Ischemic cardiomyopathy: Secondary | ICD-10-CM

## 2023-04-23 DIAGNOSIS — G4733 Obstructive sleep apnea (adult) (pediatric): Secondary | ICD-10-CM | POA: Diagnosis not present

## 2023-04-23 NOTE — Progress Notes (Signed)
@Patient  ID: Brent Griffith, male    DOB: 1936-01-24, 87 y.o.   MRN: [id number]  Chief Complaint  Patient presents with   Follow-up    Referring provider: Sharon Seller, NP  HPI: 87 yo male followed for Sleep Apnea  PMH significant for  CAD/CABG/ MI/ AFib, ,  HTN, GERD,  Allergic Rhinitis,  Covid infection Sept 2022,   TEST/EVENTS :  NPSG 12/15/04- AHI  9.2/ hr, desaturation to 76%, PLMA 13.6/ hr, body weight 170 lbs   04/23/2023 Follow up: OSA  Patient returns for a follow-up for sleep apnea.  Last seen May 2023.  Patient remains on CPAP therapy.  Patient says he wears a CPAP every single night.  Did have a mask issue recently where he lost a component of the mask and tubing and had to wait for new supplies.  Patient says his CPAP machine several months ago has a message that he has exceeded his motor/battery life.  Needs a new CPAP machine.  CPAP download shows good compliance, daily average usage at 6.5 hours.  Patient is on auto CPAP 6 to 12 cm H2O.  AHI 3.6/hour. Is using a fullface mask.  Feels that he is benefiting from CPAP with decreased daytime sleepiness.     Allergies  Allergen Reactions   Compazine [Prochlorperazine Edisylate] Anxiety    Immunization History  Administered Date(s) Administered   Fluad Quad(high Dose 65+) 01/25/2020, 02/20/2021, 03/16/2022   Fluad Trivalent(High Dose 65+) 01/09/2023   Influenza Split 01/30/2012, 02/10/2014   Influenza Whole 02/17/2009, 02/18/2013   Influenza, High Dose Seasonal PF 03/19/2018, 02/02/2019   Influenza, Quadrivalent, Recombinant, Inj, Pf 02/23/2017   Influenza,inj,Quad PF,6+ Mos 02/23/2017   Influenza-Unspecified 02/08/2014, 02/24/2015, 03/16/2016   Moderna Covid-19 Fall Seasonal Vaccine 48yrs & older 03/27/2023   Moderna Sars-Covid-2 Vaccination 07/21/2019, 08/18/2019, 05/03/2020, 09/28/2020, 01/11/2021   Pneumococcal Conjugate-13 04/09/2013   Pneumococcal Polysaccharide-23 10/18/2004   Td 09/29/1990   Tdap  07/25/2011   Zoster Recombinant(Shingrix) 10/09/2016, 04/06/2021, 07/04/2021    Past Medical History:  Diagnosis Date   Acute gastric ulcer without mention of hemorrhage, perforation, or obstruction    Allergic rhinitis, cause unspecified    Anemia, unspecified    Arthritis    Asthma    Blood transfusion without reported diagnosis    CAD (coronary artery disease)    Diaphragmatic hernia without mention of obstruction or gangrene    Elevated prostate specific antigen (PSA)    Enlarged prostate    Esophageal reflux    Extrinsic asthma, unspecified    Herpes zoster without mention of complication    Hyperlipidemia    Hypersomnia with sleep apnea, unspecified    Impotence of organic origin    Kyphosis    Lumbago    Obstructive sleep apnea (adult) (pediatric)    Pneumonia    hx of years ago    Senile osteoporosis    Trigger finger (acquired)    Tubular adenoma of colon 05/2013   Unspecified essential hypertension    Unspecified sleep apnea    CPAP- settings 4-12    Unspecified vitamin D deficiency     Tobacco History: Social History   Tobacco Use  Smoking Status Never   Passive exposure: Never  Smokeless Tobacco Never   Counseling given: Not Answered   Outpatient Medications Prior to Visit  Medication Sig Dispense Refill   albuterol (VENTOLIN HFA) 108 (90 Base) MCG/ACT inhaler Inhale 1-2 puffs into the lungs every 6 (six) hours as needed for wheezing or shortness  of breath. 34 g 3   alum & mag hydroxide-simeth (MAALOX MAX) 400-400-40 MG/5ML suspension Take 10 mLs by mouth every 6 (six) hours as needed (pain with swallowing and reflux). 355 mL 0   amiodarone (PACERONE) 200 MG tablet TAKE 1 TABLET BY MOUTH DAILY 90 tablet 3   Calcium Citrate-Vitamin D (EQ CALCIUM CITRATE+D3 PO) Take 1,200 mg by mouth daily.     carvedilol (COREG) 3.125 MG tablet TAKE 1 TABLET BY MOUTH TWICE  DAILY 200 tablet 2   Cholecalciferol (VITAMIN D-3) 5000 units TABS Take 10,000 Units by mouth  daily.     Coenzyme Q10 (COQ10) 200 MG CAPS Take 200 mg by mouth daily.     doxazosin (CARDURA) 2 MG tablet TAKE 1 TABLET BY MOUTH DAILY 90 tablet 3   empagliflozin (JARDIANCE) 10 MG TABS tablet Take 1 tablet (10 mg total) by mouth daily before breakfast.     famotidine (PEPCID) 20 MG tablet Take 1 tablet (20 mg total) by mouth 2 (two) times daily. 180 tablet 3   fexofenadine (ALLEGRA) 180 MG tablet Take 180 mg by mouth daily as needed for allergies.     finasteride (PROSCAR) 5 MG tablet TAKE 1 TABLET BY MOUTH IN  THE MORNING 90 tablet 3   furosemide (LASIX) 20 MG tablet TAKE 1 TABLET BY MOUTH DAILY AS  NEEDED FOR LEG SWELLING 100 tablet 2   Iron, Ferrous Sulfate, 325 (65 Fe) MG TABS Take 325 mg by mouth 2 (two) times daily. 30 tablet    losartan (COZAAR) 25 MG tablet Take 1 tablet (25 mg total) by mouth daily. Please keep scheduled appointment with cardiologist 100 tablet 2   mexiletine (MEXITIL) 150 MG capsule Take 1 capsule (150 mg total) by mouth 2 (two) times daily. 180 capsule 2   pantoprazole (PROTONIX) 40 MG tablet TAKE 1 TABLET BY MOUTH DAILY 100 tablet 2   rosuvastatin (CRESTOR) 20 MG tablet TAKE 1 TABLET BY MOUTH ONCE  DAILY FOR CHOLESTEROL 90 tablet 1   UNABLE TO FIND Take 1 tablet by mouth daily. Med Name: Inner ear health plus     warfarin (COUMADIN) 2.5 MG tablet TAKE 1/2 TO 1 TABLET BY MOUTH  DAILY AS DIRECTED BY THE  COUMADIN CLINIC 80 tablet 2   No facility-administered medications prior to visit.     Review of Systems:   Constitutional:   No  weight loss, night sweats,  Fevers, chills, fatigue, or  lassitude.  HEENT:   No headaches,  Difficulty swallowing,  Tooth/dental problems, or  Sore throat,                No sneezing, itching, ear ache, nasal congestion, post nasal drip,   CV:  No chest pain,  Orthopnea, PND, swelling in lower extremities, anasarca, dizziness, palpitations, syncope.   GI  No heartburn, indigestion, abdominal pain, nausea, vomiting, diarrhea,  change in bowel habits, loss of appetite, bloody stools.   Resp: No shortness of breath with exertion or at rest.  No excess mucus, no productive cough,  No non-productive cough,  No coughing up of blood.  No change in color of mucus.  No wheezing.  No chest wall deformity  Skin: no rash or lesions.  GU: no dysuria, change in color of urine, no urgency or frequency.  No flank pain, no hematuria   MS: Chronic neck and back issues    Physical Exam  BP 124/60 (BP Location: Left Arm, Patient Position: Sitting, Cuff Size: Normal)   Pulse  60   Ht 5\' 2"  (1.575 m)   Wt 125 lb 9.6 oz (57 kg)   SpO2 100%   BMI 22.97 kg/m   GEN: A/Ox3; pleasant , NAD, well nourished , walker    HEENT:  [place]/AT,  EACs-clear, TMs-wnl, NOSE-clear, THROAT-clear, no lesions, no postnasal drip or exudate noted.  Class 2 MP airway , poor dentition  NECK:  Supple w/ fair ROM; no JVD; normal carotid impulses w/o bruits; no thyromegaly or nodules palpated; no lymphadenopathy.    RESP  Clear  P & A; w/o, wheezes/ rales/ or rhonchi. no accessory muscle use, no dullness to percussion  CARD:  RRR, no m/r/g, no peripheral edema, pulses intact, no cyanosis or clubbing.  GI:   Soft & nt; nml bowel sounds; no organomegaly or masses detected.   Musco: Warm bil, no deformities or joint swelling noted. Kyphosis , back/neck brace.   Neuro: alert, no focal deficits noted.    Skin: Warm, no lesions or rashes    Lab Results:  CBC     BNP No results found for: "BNP"  ProBNP No results found for: "PROBNP"  Imaging: No results found.  Administration History     None          Latest Ref Rng & Units 05/04/2014   10:47 AM  PFT Results  FVC-Pre L 3.15   FVC-Predicted Pre % 93   FVC-Post L 3.16   FVC-Predicted Post % 93   Pre FEV1/FVC % % 79   Post FEV1/FCV % % 80   FEV1-Pre L 2.49   FEV1-Predicted Pre % 104   FEV1-Post L 2.54   DLCO uncorrected ml/min/mmHg 15.14   DLCO UNC% % 59   DLVA Predicted %  61   TLC L 6.71   TLC % Predicted % 111   RV % Predicted % 147     No results found for: "NITRICOXIDE"      Assessment & Plan:   Obstructive sleep apnea Mild obstructive sleep apnea with good compliance.  Has excellent control on CPAP.  Needs a new CPAP machine.  Order for new CPAP to DME was sent. CPAP education was given  Plan  Patient Instructions  Continue on CPAP At bedtime, wear all night long.  Order for new CPAP machine  Follow up with Dr. Maple Hudson in 1 year and As needed         Rubye Oaks, NP 04/23/2023

## 2023-04-23 NOTE — Patient Instructions (Signed)
Continue on CPAP At bedtime, wear all night long.  Order for new CPAP machine  Follow up with Dr. Maple Hudson in 1 year and As needed

## 2023-04-23 NOTE — Assessment & Plan Note (Signed)
Mild obstructive sleep apnea with good compliance.  Has excellent control on CPAP.  Needs a new CPAP machine.  Order for new CPAP to DME was sent. CPAP education was given  Plan  Patient Instructions  Continue on CPAP At bedtime, wear all night long.  Order for new CPAP machine  Follow up with Dr. Maple Hudson in 1 year and As needed

## 2023-04-24 ENCOUNTER — Telehealth: Payer: Self-pay | Admitting: Adult Health

## 2023-04-24 NOTE — Telephone Encounter (Signed)
PT needs HST scanned in for CPAP order to [facility]   Date reference a sleep study from 12/2004   Please assist me on locating study for [employer] to process order   Thanks

## 2023-04-25 NOTE — Telephone Encounter (Signed)
Will have to reach out to patient  and maybe Dr. Maple Hudson  to see if they know where copy is.  If DME will not approve new machine will have to repeat sleep study to get new CPAP .

## 2023-04-25 NOTE — Telephone Encounter (Signed)
Under the Notes Tab, dated 09/19/10, attributed to Orthopedic Surgery, is the report of his NPSG sleep study which was done originally 12/1104.  I have printed it and will submit to be re-scanned in - hopefully correctly this time.

## 2023-04-25 NOTE — Telephone Encounter (Signed)
Have we checked with [employer] -they are the DME they get supplies from

## 2023-05-03 NOTE — Telephone Encounter (Signed)
New, Loreli Dollar, Pietro Cassis; Darcel Smalling I got it. this may work. I will let you know if not. Thank you!

## 2023-05-03 NOTE — Telephone Encounter (Addendum)
Notes for the 2012 sleep study are not feasible, message from [employer]   New, Loreli Dollar, Carmie End Tomie China; Angus Seller, [facility] Raven,  I just received word from our intake team, and they confirmed this will need a new sleep study if the old one is not located.  Thank you,  Luellen Pucker

## 2023-05-06 ENCOUNTER — Telehealth: Payer: Self-pay | Admitting: Adult Health

## 2023-05-06 ENCOUNTER — Ambulatory Visit: Payer: Medicare Other | Admitting: Primary Care

## 2023-05-06 NOTE — Telephone Encounter (Signed)
Patient has not gotten an update on his CPAP Machine. He is waiting to receive a new one. Please call and advise. [phone]

## 2023-05-10 ENCOUNTER — Ambulatory Visit: Payer: Medicare Other | Attending: Cardiovascular Disease

## 2023-05-10 DIAGNOSIS — H6123 Impacted cerumen, bilateral: Secondary | ICD-10-CM | POA: Diagnosis not present

## 2023-05-10 DIAGNOSIS — Z5181 Encounter for therapeutic drug level monitoring: Secondary | ICD-10-CM

## 2023-05-10 DIAGNOSIS — I4891 Unspecified atrial fibrillation: Secondary | ICD-10-CM

## 2023-05-10 LAB — POCT INR: INR: 1.9 — AB (ref 2.0–3.0)

## 2023-05-10 NOTE — Patient Instructions (Signed)
 Take another 0.5 tablet today only then continue taking 1/2 tablet daily, EXCEPT 1 tablet on Mondays and Fridays.  Stay consistent with boost (Mon, Wed, and Fri)  Recheck INR in 3 weeks. Coumadin Clinic [phone]

## 2023-05-14 ENCOUNTER — Ambulatory Visit: Payer: Self-pay

## 2023-05-14 DIAGNOSIS — I25709 Atherosclerosis of coronary artery bypass graft(s), unspecified, with unspecified angina pectoris: Secondary | ICD-10-CM

## 2023-05-14 DIAGNOSIS — I4891 Unspecified atrial fibrillation: Secondary | ICD-10-CM

## 2023-05-14 DIAGNOSIS — I10 Essential (primary) hypertension: Secondary | ICD-10-CM

## 2023-05-14 NOTE — Patient Outreach (Signed)
  Care Coordination   05/14/2023 Name: Brent Griffith MRN: [id number] DOB: 06-Aug-1935   Care Coordination Outreach Attempts:  An unsuccessful outreach was attempted for an appointment today.  Follow Up Plan:  Additional outreach attempts will be made to offer the patient complex care management information and services.   Encounter Outcome:  No Answer   Care Coordination Interventions:  No, not indicated    Clayborne Ly RN BSN CCM [place]  [facility], [facility] Health Nurse Care Coordinator  Direct Dial: [phone] Website: [email or web address]

## 2023-05-14 NOTE — Patient Outreach (Signed)
  Care Coordination   Follow Up Visit Note   05/14/2023 Name: Brent Griffith MRN: [id number] DOB: 02/20/1936  Brent Griffith is a 88 y.o. year old male who sees Eubanks, Harlene POUR, NP for primary care. I spoke with  Brent Griffith by phone today.  What matters to the patients health and wellness today?  Patient would like to get his blood pressure under better control.     Goals Addressed             This Visit's Progress    To improve blood pressure control       Care Coordination Interventions: Evaluation of current treatment plan related to hypertension self management and patient's adherence to plan as established by provider Reviewed medications with patient and discussed importance of compliance Advised patient, providing education and rationale, to monitor blood pressure daily and record, calling PCP for findings outside established parameters Provided education on prescribed diet low Sodium  Assessed social determinant of health barriers Educated patient about target BP <130/80 Mailed printed educational materials related to Hypertension Management  Last practice recorded BP readings:  BP Readings from Last 3 Encounters:  04/23/23 124/60  03/18/23 110/64  02/19/23 106/66   Most recent eGFR/CrCl:  Lab Results  Component Value Date   EGFR 67 01/18/2023    No components found for: CRCL    Interventions Today    Flowsheet Row Most Recent Value  Chronic Disease   Chronic disease during today's visit Hypertension (HTN)  General Interventions   General Interventions Discussed/Reviewed General Interventions Discussed, General Interventions Reviewed, Doctor Visits  Doctor Visits Discussed/Reviewed Doctor Visits Discussed, Doctor Visits Reviewed, PCP  Education Interventions   Education Provided Provided Education  Provided Verbal Education On Medication, When to see the doctor, Nutrition  Nutrition Interventions   Nutrition Discussed/Reviewed Nutrition Discussed,  Nutrition Reviewed, Decreasing salt  Pharmacy Interventions   Pharmacy Dicussed/Reviewed Pharmacy Topics Discussed, Pharmacy Topics Reviewed, Medications and their functions, Medication Adherence         SDOH assessments and interventions completed:  Yes  SDOH Interventions Today    Flowsheet Row Most Recent Value  SDOH Interventions   Food Insecurity Interventions AMB Referral  [Care Guide Referral sent]  Housing Interventions Intervention Not Indicated  Transportation Interventions Intervention Not Indicated  Utilities Interventions Intervention Not Indicated        Care Coordination Interventions:  Yes, provided   Follow up plan: Referral made to Pharmacy to assist with cost of Jardiance ; Care Guide to assist with resources for food insecurities; referral to Tobias Moose BSW to review Medicaid benefits (scheduled for 05/22/23 @2 :30 PM) Follow up call scheduled for 06/11/23 @2 :30 PM    Encounter Outcome:  Patient Visit Completed

## 2023-05-14 NOTE — Patient Instructions (Signed)
 Visit Information  Thank you for taking time to visit with me today. Please don't hesitate to contact me if I can be of assistance to you.   Following are the goals we discussed today:   Goals Addressed             This Visit's Progress    To improve blood pressure control       Care Coordination Interventions: Evaluation of current treatment plan related to hypertension self management and patient's adherence to plan as established by provider Reviewed medications with patient and discussed importance of compliance Advised patient, providing education and rationale, to monitor blood pressure daily and record, calling PCP for findings outside established parameters Provided education on prescribed diet low Sodium  Assessed social determinant of health barriers Educated patient about target BP <130/80 Mailed printed educational materials related to Hypertension Management  Last practice recorded BP readings:  BP Readings from Last 3 Encounters:  04/23/23 124/60  03/18/23 110/64  02/19/23 106/66   Most recent eGFR/CrCl:  Lab Results  Component Value Date   EGFR 67 01/18/2023    No components found for: CRCL         Our next appointment is by telephone on 06/11/23 at 2:30 PM  Please call the care guide team at [phone] if you need to cancel or reschedule your appointment.   If you are experiencing a Mental Health or Behavioral Health Crisis or need someone to talk to, please call 1-800-273-TALK (toll free, 24 hour hotline)  Patient verbalizes understanding of instructions and care plan provided today and agrees to view in MyChart. Active MyChart status and patient understanding of how to access instructions and care plan via MyChart confirmed with patient.     Clayborne Ly RN BSN CCM [place]  [facility] Health Nurse Care Coordinator  Direct Dial: [phone] Website: [email or web address]

## 2023-05-14 NOTE — Addendum Note (Signed)
 Addended by: Riley Churches on: 05/14/2023 04:04 PM   Modules accepted: Orders

## 2023-05-15 ENCOUNTER — Encounter (HOSPITAL_BASED_OUTPATIENT_CLINIC_OR_DEPARTMENT_OTHER): Payer: Self-pay | Admitting: Emergency Medicine

## 2023-05-15 ENCOUNTER — Other Ambulatory Visit: Payer: Self-pay

## 2023-05-15 ENCOUNTER — Emergency Department (HOSPITAL_BASED_OUTPATIENT_CLINIC_OR_DEPARTMENT_OTHER): Payer: Medicare Other

## 2023-05-15 ENCOUNTER — Emergency Department (HOSPITAL_BASED_OUTPATIENT_CLINIC_OR_DEPARTMENT_OTHER)
Admission: EM | Admit: 2023-05-15 | Discharge: 2023-05-15 | Disposition: A | Discharge status: Home / Self Care | Payer: Medicare Other | Attending: Emergency Medicine | Admitting: Emergency Medicine

## 2023-05-15 DIAGNOSIS — M542 Cervicalgia: Secondary | ICD-10-CM | POA: Insufficient documentation

## 2023-05-15 DIAGNOSIS — W01198A Fall on same level from slipping, tripping and stumbling with subsequent striking against other object, initial encounter: Secondary | ICD-10-CM | POA: Diagnosis not present

## 2023-05-15 DIAGNOSIS — Z7901 Long term (current) use of anticoagulants: Secondary | ICD-10-CM | POA: Insufficient documentation

## 2023-05-15 DIAGNOSIS — I4891 Unspecified atrial fibrillation: Secondary | ICD-10-CM | POA: Insufficient documentation

## 2023-05-15 DIAGNOSIS — S0990XA Unspecified injury of head, initial encounter: Secondary | ICD-10-CM | POA: Insufficient documentation

## 2023-05-15 DIAGNOSIS — Z79899 Other long term (current) drug therapy: Secondary | ICD-10-CM | POA: Insufficient documentation

## 2023-05-15 DIAGNOSIS — G8929 Other chronic pain: Secondary | ICD-10-CM | POA: Insufficient documentation

## 2023-05-15 DIAGNOSIS — I6381 Other cerebral infarction due to occlusion or stenosis of small artery: Secondary | ICD-10-CM | POA: Diagnosis not present

## 2023-05-15 DIAGNOSIS — Y9301 Activity, walking, marching and hiking: Secondary | ICD-10-CM | POA: Insufficient documentation

## 2023-05-15 DIAGNOSIS — S22029A Unspecified fracture of second thoracic vertebra, initial encounter for closed fracture: Secondary | ICD-10-CM | POA: Diagnosis not present

## 2023-05-15 DIAGNOSIS — M4802 Spinal stenosis, cervical region: Secondary | ICD-10-CM | POA: Diagnosis not present

## 2023-05-15 DIAGNOSIS — M40204 Unspecified kyphosis, thoracic region: Secondary | ICD-10-CM | POA: Diagnosis not present

## 2023-05-15 LAB — PROTIME-INR
INR: 2.3 — ABNORMAL HIGH (ref 0.8–1.2)
Prothrombin Time: 25.7 s — ABNORMAL HIGH (ref 11.4–15.2)

## 2023-05-15 NOTE — ED Notes (Signed)

## 2023-05-15 NOTE — Discharge Instructions (Signed)
 Fortunately, your CT scans did not show any bleeding or fractures.  Take your Coumadin as instructed by the Coumadin clinic.  If you develop a headache, vomiting, or any other new/concerning symptoms then return to the ER or call 911.

## 2023-05-15 NOTE — ED Provider Notes (Signed)
 [place] EMERGENCY DEPARTMENT AT [facility] Provider Note   CSN: [id number] Arrival date & time: 05/15/23  1708     History  Chief Complaint  Patient presents with   Brent Griffith is a 88 y.o. male.  HPI 88 year old male presents with a trip and fall.  He was walking up his porch and his foot got caught and he hit the side of the porch on his right scalp.  No LOC.  No headache, but he presents for evaluation because he is on warfarin.  He is on warfarin for A-fib.  Most recent INR 5 days ago was subtherapeutic and he had an increased dose x 1.  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  No weakness or numbness.  No vomiting or other injuries.  Home Medications Prior to Admission medications   Medication Sig Start Date End Date Taking? Authorizing Provider  albuterol  (VENTOLIN  HFA) 108 (90 Base) MCG/ACT inhaler Inhale 1-2 puffs into the lungs every 6 (six) hours as needed for wheezing or shortness of breath. 07/03/22   Fargo, Amy E, NP  alum & mag hydroxide-simeth (MAALOX MAX) 400-400-40 MG/5ML suspension Take 10 mLs by mouth every 6 (six) hours as needed (pain with swallowing and reflux). 10/21/22   Ethyl Richerd BROCKS, MD  amiodarone  (PACERONE ) 200 MG tablet TAKE 1 TABLET BY MOUTH DAILY 11/01/22   [name], MD  Calcium  Citrate-Vitamin D (EQ CALCIUM  CITRATE+D3 PO) Take 1,200 mg by mouth daily.    [provider]  carvedilol  (COREG ) 3.125 MG tablet TAKE 1 TABLET BY MOUTH TWICE  DAILY 01/21/23   [name], MD  Cholecalciferol  (VITAMIN D-3) 5000 units TABS Take 10,000 Units by mouth daily.    [provider]  Coenzyme Q10 (COQ10) 200 MG CAPS Take 200 mg by mouth daily.    [provider]  doxazosin  (CARDURA ) 2 MG tablet TAKE 1 TABLET BY MOUTH DAILY 03/11/23   [name], MD  empagliflozin  (JARDIANCE ) 10 MG TABS  tablet Take 1 tablet (10 mg total) by mouth daily before breakfast. 11/30/22   [name], MD  famotidine  (PEPCID ) 20 MG tablet Take 1 tablet (20 mg total) by mouth 2 (two) times daily. 02/05/23 05/07/48  McMichael, Nestor HERO, PA-C  fexofenadine (ALLEGRA) 180 MG tablet Take 180 mg by mouth daily as needed for allergies.    [provider]  finasteride  (PROSCAR ) 5 MG tablet TAKE 1 TABLET BY MOUTH IN  THE MORNING 10/06/21   Eubanks, Jessica K, NP  furosemide  (LASIX ) 20 MG tablet TAKE 1 TABLET BY MOUTH DAILY AS  NEEDED FOR LEG SWELLING 01/21/23   [name], MD  Iron, Ferrous Sulfate, 325 (65 Fe) MG TABS Take 325 mg by mouth 2 (two) times daily. 09/13/21   [name], NP  losartan  (COZAAR ) 25 MG tablet TAKE 1 TABLET BY MOUTH DAILY  PLEASE KEEP SCHEDULED  APPOINTMENT WITH CARDIOLOGIST 04/23/23   [name], MD  mexiletine (MEXITIL ) 150 MG capsule Take 1 capsule (150 mg total) by mouth 2 (two) times daily. 04/16/23   [name], MD  pantoprazole  (PROTONIX ) 40 MG tablet TAKE 1 TABLET BY MOUTH DAILY 03/01/23   Eubanks, Jessica K, NP  rosuvastatin  (CRESTOR ) 20 MG tablet TAKE 1 TABLET BY MOUTH ONCE  DAILY FOR CHOLESTEROL 04/22/23   [name],  NP  UNABLE TO FIND Take 1 tablet by mouth daily. Med Name: Inner ear health plus    [provider]  warfarin (COUMADIN ) 2.5 MG tablet TAKE 1/2 TO 1 TABLET BY MOUTH  DAILY AS DIRECTED BY THE  COUMADIN  CLINIC 04/15/23   [name], MD      Allergies    Compazine [prochlorperazine edisylate]    Review of Systems   Review of Systems  Gastrointestinal:  Negative for vomiting.  Musculoskeletal:  Positive for neck pain.  Neurological:  Negative for weakness, numbness and headaches.    Physical Exam Updated Vital Signs BP (!) 185/64 (BP Location: Right Arm)   Pulse (!) 55   Temp 97.6 F (36.4 C) (Oral)   Resp 18   Wt 54.4 kg   SpO2 100%   BMI 21.95 kg/m  Physical Exam Vitals and nursing note  reviewed.  Constitutional:      Appearance: He is well-developed.  HENT:     Head: Normocephalic and atraumatic.  Eyes:     Pupils: Pupils are equal, round, and reactive to light.  Neck:     Comments: Kyphosis. Mild left lateral neck tenderness. No bony tenderness Cardiovascular:     Rate and Rhythm: Normal rate and regular rhythm.     Heart sounds: Normal heart sounds.  Pulmonary:     Effort: Pulmonary effort is normal.  Abdominal:     General: There is no distension.  Skin:    General: Skin is warm and dry.  Neurological:     Mental Status: He is alert.     Comments: Awake, alert, no slurred speech or facial droop. 5/5 strength in all 4 extremities.     ED Results / Procedures / Treatments   Labs (all labs ordered are listed, but only abnormal results are displayed) Labs Reviewed  PROTIME-INR - Abnormal; Notable for the following components:      Result Value   Prothrombin Time 25.7 (*)    INR 2.3 (*)    All other components within normal limits    EKG None  Radiology CT Cervical Spine Wo Contrast Result Date: 05/15/2023 CLINICAL DATA:  Mechanical fall.  Hit head against a building. EXAM: CT CERVICAL SPINE WITHOUT CONTRAST TECHNIQUE: Multidetector CT imaging of the cervical spine was performed without intravenous contrast. Multiplanar CT image reconstructions were also generated. RADIATION DOSE REDUCTION: This exam was performed according to the departmental dose-optimization program which includes automated exposure control, adjustment of the mA and/or kV according to patient size and/or use of iterative reconstruction technique. COMPARISON:  CT of the cervical and thoracic spine 10/03/2022 FINDINGS: Alignment: Exaggerated cervical lordosis compensates for thoracic kyphosis. No significant listhesis is present. Skull base and vertebrae: A remote T2 fracture and ankylosis at T1-2 is noted. No acute fractures are present. Median sternotomy is noted. Soft tissues and spinal  canal: No prevertebral fluid or swelling. No visible canal hematoma. Disc levels: Uncovertebral and foraminal stenosis is greatest at C3-4. Upper chest: The lung apices are clear. The thoracic inlet is within normal limits. IMPRESSION: 1. No acute fracture or traumatic subluxation. 2. Remote T2 fracture and ankylosis at T1-2. 3. 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 Electronically Signed   By: Lonni Necessary M.D.   On: 05/15/2023 19:30   CT Head Wo Contrast Result Date: 05/15/2023 CLINICAL DATA:  Head trauma. Mechanical fall today. Hit right side of head against building. Patient takes warfarin. EXAM: CT HEAD WITHOUT CONTRAST TECHNIQUE: Contiguous axial images were  obtained from the base of the skull through the vertex without intravenous contrast. RADIATION DOSE REDUCTION: This exam was performed according to the departmental dose-optimization program which includes automated exposure control, adjustment of the mA and/or kV according to patient size and/or use of iterative reconstruction technique. COMPARISON:  CT head without contrast 10/03/2022 FINDINGS: Brain: Mild generalized atrophy and white matter disease is stable. Remote lacunar infarct involving the left caudate head and anterior limb internal capsule is stable. No acute infarct, hemorrhage, or mass lesion is present. Deep brain nuclei are otherwise within normal limits. The ventricles are of normal size. No significant extraaxial fluid collection is present. The brainstem and cerebellum are within normal limits. Midline structures are within normal limits. Vascular: Atherosclerotic calcifications are present within the cavernous internal carotid arteries bilaterally and at the dural margin of the left vertebral artery. No hyperdense vessel is present. Skull: Calvarium is intact. No focal lytic or blastic lesions are present. No significant extracranial soft tissue lesion is present. Sinuses/Orbits: The paranasal sinuses and  mastoid air cells are clear. The globes and orbits are within normal limits. IMPRESSION: 1. No acute intracranial abnormality or significant interval change. 2. Stable atrophy and white matter disease. This likely reflects the sequela of chronic microvascular ischemia. 3. Stable remote lacunar infarct of the left caudate head and anterior limb internal capsule. Electronically Signed   By: Lonni Necessary M.D.   On: 05/15/2023 19:27    Procedures Procedures    Medications Ordered in ED Medications - No data to display  ED Course/ Medical Decision Making/ A&P                                 Medical Decision Making Amount and/or Complexity of Data Reviewed Labs: ordered.    Details: INR 2.3 Radiology: ordered and independent interpretation performed.    Details: No head bleed or cervical spine fracture   Patient presents with minor head injury but is on warfarin.  Warfarin is not supratherapeutic today.  His CT head and cervical spine are showing no signs of acute trauma.  He has no headache.  I think he is stable for discharge.  We did discuss there is a chance of delayed bleeding and he should watch for signs or symptoms of headache, vomiting, etc.  Otherwise, he appears well and stable for discharge home with return precautions.  This sounds like a mechanical fall.        Final Clinical Impression(s) / ED Diagnoses Final diagnoses:  Minor head injury, initial encounter    Rx / DC Orders ED Discharge Orders     None         Freddi Hamilton, MD 05/15/23 1943

## 2023-05-15 NOTE — ED Triage Notes (Signed)
 Tripped on his porch , head injury against storing building . On warfarin . Alert and oriented x 4 .

## 2023-05-16 ENCOUNTER — Telehealth: Payer: Self-pay

## 2023-05-16 NOTE — Progress Notes (Signed)
 Care Guide Pharmacy Note  05/16/2023 Name: DEFOREST MAIDEN MRN: [id number] DOB: [birth year]  Referred By: [name], NP Reason for referral: Care Coordination (Outreach to schedule with Pharm d )   CHLOE BAIG is a 88 y.o. year old male who is a primary care patient of [name], NP.  Norleen SHAUNNA Creamer was referred to the pharmacist for assistance related to: HTN  Successful contact was made with the patient to discuss pharmacy services including being ready for the pharmacist to call at least 5 minutes before the scheduled appointment time and to have medication bottles and any blood pressure readings ready for review. The patient agreed to meet with the pharmacist via telephone visit on (date/time).05/27/2023  Jeoffrey Buffalo , RMA     [place]  [facility], [facility] Guide  Direct Dial: [phone]  Website: [email or web address]

## 2023-05-16 NOTE — Transitions of Care (Post Inpatient/ED Visit) (Signed)
 05/16/2023  Name: Brent NIDIFFER MRN: [id number] DOB: 11/05/1935  Today's TOC FU Call Status: Today's TOC FU Call Status:: Successful TOC FU Call Completed TOC FU Call Complete Date: 05/27/23 Patient's Name and Date of Birth confirmed.  Transition Care Management Follow-up Telephone Call Date of Discharge: 05/15/23 Discharge Facility: [facility] Type of Discharge: Emergency Department Reason for ED Visit: Other: (Fall) How have you been since you were released from the hospital?: Better Any questions or concerns?: Yes Patient Questions/Concerns:: Patient will save questions for appointment.  Items Reviewed: Did you receive and understand the discharge instructions provided?: Yes Medications obtained,verified, and reconciled?: Yes (Medications Reviewed) Any new allergies since your discharge?: No Dietary orders reviewed?: No Do you have support at home?: Yes People in Home: spouse Name of Support/Comfort Primary Source: Mrs.[place]  Medications Reviewed Today: Medications Reviewed Today     Reviewed by Shaquinta Peruski E, CMA (Certified Medical Assistant) on 05/16/23 at [phone]  Med List Status: <None>   Medication Order Taking? Sig Documenting Provider Last Dose Status Informant  albuterol  (VENTOLIN  HFA) 108 (90 Base) MCG/ACT inhaler [id number] Yes Inhale 1-2 puffs into the lungs every 6 (six) hours as needed for wheezing or shortness of breath. Fargo, Amy E, NP Taking Active   alum & mag hydroxide-simeth (MAALOX MAX) 400-400-40 MG/5ML suspension [id number] Yes Take 10 mLs by mouth every 6 (six) hours as needed (pain with swallowing and reflux). Ethyl Richerd BROCKS, MD Taking Active   amiodarone  (PACERONE ) 200 MG tablet [id number] Yes TAKE 1 TABLET BY MOUTH DAILY [name], MD Taking Active   Calcium  Citrate-Vitamin D (EQ CALCIUM  CITRATE+D3 PO) [id number] Yes Take 1,200 mg by mouth daily. [provider] Taking Active Self  carvedilol  (COREG ) 3.125 MG tablet  [id number] Yes TAKE 1 TABLET BY MOUTH TWICE  DAILY Crenshaw, Redell RAMAN, MD Taking Active   Cholecalciferol  (VITAMIN D-3) 5000 units TABS [id number] Yes Take 10,000 Units by mouth daily. [provider] Taking Active Self  Coenzyme Q10 (COQ10) 200 MG CAPS [id number] Yes Take 200 mg by mouth daily. [provider] Taking Active Self  doxazosin  (CARDURA ) 2 MG tablet [id number] Yes TAKE 1 TABLET BY MOUTH DAILY Crenshaw, Redell RAMAN, MD Taking Active   empagliflozin  (JARDIANCE ) 10 MG TABS tablet [id number] Yes Take 1 tablet (10 mg total) by mouth daily before breakfast. [name], MD Taking Active   famotidine  (PEPCID ) 20 MG tablet [id number] Yes Take 1 tablet (20 mg total) by mouth 2 (two) times daily. Mollie Pfeiffer M, PA-C Taking Active   fexofenadine (ALLEGRA) 180 MG tablet [id number] Yes Take 180 mg by mouth daily as needed for allergies. [provider] Taking Active Self  finasteride  (PROSCAR ) 5 MG tablet [id number] Yes TAKE 1 TABLET BY MOUTH IN  THE MORNING Eubanks, Jessica K, NP Taking Active   furosemide  (LASIX ) 20 MG tablet [id number] Yes TAKE 1 TABLET BY MOUTH DAILY AS  NEEDED FOR LEG SWELLING Crenshaw, Redell RAMAN, MD Taking Active   Iron, Ferrous Sulfate, 325 (65 Fe) MG TABS [id number] Yes Take 325 mg by mouth 2 (two) times daily. [name], NP Taking Active   losartan  (COZAAR ) 25 MG tablet [id number] Yes TAKE 1 TABLET BY MOUTH DAILY  PLEASE KEEP SCHEDULED  APPOINTMENT WITH CARDIOLOGIST [name], MD Taking Active   mexiletine (MEXITIL ) 150 MG capsule [id number] Yes Take 1 capsule (150 mg total) by mouth 2 (two) times daily. [name], MD Taking Active   pantoprazole  (PROTONIX ) 40 MG tablet  [id number] Yes TAKE 1 TABLET BY MOUTH DAILY Eubanks, Jessica K, NP Taking Active   rosuvastatin  (CRESTOR ) 20 MG tablet [id number] Yes TAKE 1 TABLET BY MOUTH ONCE  DAILY FOR CHOLESTEROL Eubanks, Jessica K, NP Taking Active   UNABLE TO FIND [id number] Yes Take 1 tablet by  mouth daily. Med Name: Inner ear health plus [provider] Taking Active   warfarin (COUMADIN ) 2.5 MG tablet [id number] Yes TAKE 1/2 TO 1 TABLET BY MOUTH  DAILY AS DIRECTED BY THE  COUMADIN  CLINIC [place], Redell RAMAN, MD Taking Active   Med List Note Vernel, Clinton D, MD 07/15/14 2050): CPAP Advanced AutoPap 6-12            Home Care and Equipment/Supplies: Any new equipment or medical supplies ordered?: No  Functional Questionnaire: Do you need assistance with bathing/showering or dressing?: No Do you need assistance with meal preparation?: No Do you need assistance with eating?: No Do you have difficulty maintaining continence: No Do you need assistance with getting out of bed/getting out of a chair/moving?: No Do you have difficulty managing or taking your medications?: No  Follow up appointments reviewed: PCP Follow-up appointment confirmed?: Yes Date of PCP follow-up appointment?: 05/27/23 Follow-up Provider: Harlene An, NP [facility] Follow-up appointment confirmed?: No Do you need transportation to your follow-up appointment?: No Do you understand care options if your condition(s) worsen?: Yes-patient verbalized understanding    SIGNATURE: Trixy Loyola.D/RMA

## 2023-05-21 DIAGNOSIS — H6123 Impacted cerumen, bilateral: Secondary | ICD-10-CM | POA: Diagnosis not present

## 2023-05-22 ENCOUNTER — Ambulatory Visit: Payer: Self-pay | Admitting: Licensed Clinical Social Worker

## 2023-05-22 NOTE — Patient Instructions (Signed)
 Visit Information  Thank you for taking time to visit with me today. Please don't hesitate to contact me if I can be of assistance to you.   Following are the goals we discussed today:   Goals Addressed             This Visit's Progress    Care Coordination Activities       Care Coordination Interventions: Patient stated that he lives with his wife and that they are ok with food but could use some more, the patient stated that he does not receive EBT and has not applied but did accept the offer of the SW mailing out the food pantry list.  Patient wanted inforamtion on what Medicaid would pay for and it they would pay for certain medications such as his Jardiance , Sw provided the Medicaid number the patient stated that he could call and the SW reminded the patient that he has an upcoming appointment with the RN Little and the patient stated that he needed to reschedule that appointment due to his wife having an appointment on the same date and time.  SW completed the SDOH tool and there were no other needs at this time SW will follow on on 06/07/2023 at 2:30 pm        Our next appointment is by telephone on 06/07/2023 at 2:30 pm  Please call the care guide team at [phone] if you need to cancel or reschedule your appointment.   If you are experiencing a Mental Health or Behavioral Health Crisis or need someone to talk to, please call the Suicide and Crisis Lifeline: 988 go to [facility] Urgent [facility] [address] [place] [phone]) call 911  Patient verbalizes understanding of instructions and care plan provided today and agrees to view in MyChart. Active MyChart status and patient understanding of how to access instructions and care plan via MyChart confirmed with patient.     Jonda Neighbours, PhD [facility] Social Worker Direct Dial: [phone]  Fax: [phone]

## 2023-05-22 NOTE — Patient Outreach (Signed)
  Care Coordination   Initial Visit Note   05/22/2023 Name: TEVAUGHN SANTIZO MRN: [id number] DOB: [birth year]  Annette Killings Beson is a 88 y.o. year old male who sees Eubanks, Champ Coma, NP for primary care. I spoke with  Annice Kim by phone today.  What matters to the patients health and wellness today?  Mediciad and food insecurituies     Goals Addressed             This Visit's Progress    Care Coordination Activities       Care Coordination Interventions: Patient stated that he lives with his wife and that they are ok with food but could use some more, the patient stated that he does not receive EBT and has not applied but did accept the offer of the SW mailing out the food pantry list.  Patient wanted inforamtion on what Medicaid would pay for and it they would pay for certain medications such as his Jardiance , Sw provided the Medicaid number the patient stated that he could call and the SW reminded the patient that he has an upcoming appointment with the RN Little and the patient stated that he needed to reschedule that appointment due to his wife having an appointment on the same date and time.  SW will follow on on 06/07/2023 at 2:30 pm        SDOH assessments and interventions completed:  Yes  SDOH Interventions Today    Flowsheet Row Most Recent Value  SDOH Interventions   Food Insecurity Interventions Intervention Not Indicated, Other (Comment)  [Sw will mail of food pantry list]  Housing Interventions Intervention Not Indicated  Transportation Interventions Intervention Not Indicated  Utilities Interventions Intervention Not Indicated, Community Resources Provided  [Sw Will mail out utility resource assistance]        Care Coordination Interventions:  Yes, provided  Interventions Today    Flowsheet Row Most Recent Value  General Interventions   General Interventions Discussed/Reviewed General Interventions Discussed, [employer]  [SW will mail out [employer]  list and provided the patient the Medicaid number and wanted information on what Medicaid will cover.]        Follow up plan: Follow up call scheduled for 06/07/2023  at 2:30 pm  Encounter Outcome:  Patient Visit Completed  Jonda Neighbours, PhD [facility], [facility] Social Worker Direct Dial: [phone]  Fax: [phone]

## 2023-05-27 ENCOUNTER — Ambulatory Visit (INDEPENDENT_AMBULATORY_CARE_PROVIDER_SITE_OTHER): Payer: Medicare Other | Admitting: Nurse Practitioner

## 2023-05-27 ENCOUNTER — Other Ambulatory Visit: Payer: Self-pay | Admitting: Pharmacist

## 2023-05-27 VITALS — BP 134/76 | HR 96 | Temp 97.3°F | Ht 62.0 in

## 2023-05-27 DIAGNOSIS — I10 Essential (primary) hypertension: Secondary | ICD-10-CM

## 2023-05-27 DIAGNOSIS — W19XXXD Unspecified fall, subsequent encounter: Secondary | ICD-10-CM

## 2023-05-27 DIAGNOSIS — R630 Anorexia: Secondary | ICD-10-CM

## 2023-05-27 NOTE — Progress Notes (Signed)
05/27/2023 Name: Brent Griffith MRN: [id number] DOB: [birth year]  Chief Complaint  Patient presents with   Medication Management    Brent Griffith is a 88 y.o. year old male who presented for a telephone visit.   They were referred to the pharmacist by their PCP for assistance in managing  Jardiance + Medicaid concerns .    Subjective:  Care Team: Primary Care Provider: Sharon Seller, NP ; Next Scheduled Visit: 05/27/23 at 2PM (TODAY) Clinical Pharmacist: Marlowe Aschoff, PharmD  Medication Access/Adherence  Current Pharmacy:  [facility] - [place] - [address] [place] [address] Phone: [phone] Fax: [phone]  [id number] - [place] - [address] [place] [address] Phone: [phone] Fax: [phone]   Patient reports affordability concerns with their medications: No  Patient reports access/transportation concerns to their pharmacy: Yes  Patient reports adherence concerns with their medications:  No  Uses weekly pillbox to assist  *[employer] will not deliver to patients that can drive; aware [employer] has food if needed- sometimes run out of money throughout the month   Medication Management and Other Concerns:  - Concerns regarding Jardiance cost- will need to follow-up with [employer] to ensure he is enrolled for 2025 - Reports being told by Podiatrist and maybe [employer] that he has Medicaid coverage- has ID info but not much more than that and no phone number to call with questions  Objective:  Lab Results  Component Value Date   HGBA1C 5.6 07/28/2012    Lab Results  Component Value Date   CREATININE 1.09 01/18/2023   BUN 29 (H) 01/18/2023   NA 141 01/18/2023   K 4.3 01/18/2023   CL 106 01/18/2023   CO2 25 01/18/2023    Lab Results  Component Value Date   CHOL 116 01/18/2023   HDL 58 01/18/2023   LDLCALC  46 01/18/2023   TRIG 49 01/18/2023   CHOLHDL 2.0 01/18/2023    Medications Reviewed Today     Reviewed by Pollie Friar, RPH (Pharmacist) on 05/27/23 at 1125  Med List Status: <None>   Medication Order Taking? Sig Documenting Provider Last Dose Status Informant  albuterol (VENTOLIN HFA) 108 (90 Base) MCG/ACT inhaler [id number] Yes Inhale 1-2 puffs into the lungs every 6 (six) hours as needed for wheezing or shortness of breath. Fargo, Amy E, NP Taking Active   alum & mag hydroxide-simeth (MAALOX MAX) 400-400-40 MG/5ML suspension [id number] No Take 10 mLs by mouth every 6 (six) hours as needed (pain with swallowing and reflux).  Patient not taking: Reported on 05/27/2023   Mardene Sayer, MD Not Taking Active   amiodarone (PACERONE) 200 MG tablet [id number] Yes TAKE 1 TABLET BY MOUTH DAILY Lanier Prude, MD Taking Active   Ascorbic Acid (VITAMIN C) 1000 MG tablet [id number] Yes Take 1,000 mg by mouth daily. [provider] Taking Active   Calcium Citrate-Vitamin D (EQ CALCIUM CITRATE+D3 PO) [id number] Yes Take 1,200 mg by mouth daily. [provider] Taking Active Self           Med Note Lorenso Courier, Marjory Lies   Mon May 27, 2023 11:20 AM) 600mg  Ca daily  carvedilol (COREG) 3.125 MG tablet [id number] Yes TAKE 1 TABLET BY MOUTH TWICE  DAILY Lewayne Bunting, MD Taking Active   Cholecalciferol (VITAMIN D-3) 5000 units TABS [id number] Yes Take  10,000 Units by mouth daily. [provider] Taking Active Self  Coenzyme Q10 (COQ10) 200 MG CAPS [id number] Yes Take 200 mg by mouth daily. [provider] Taking Active Self  doxazosin (CARDURA) 2 MG tablet [id number] Yes TAKE 1 TABLET BY MOUTH DAILY Crenshaw, Madolyn Frieze, MD Taking Active   empagliflozin (JARDIANCE) 10 MG TABS tablet [id number] Yes Take 1 tablet (10 mg total) by mouth daily before breakfast. Lewayne Bunting, MD Taking Active            Med Note Lorenso Courier, Antaniya Venuti M   Mon May 27, 2023 11:19 AM) [employer] PAP   famotidine (PEPCID) 20 MG tablet [id number] Yes Take 1 tablet (20 mg total) by mouth 2 (two) times daily. Boone Master M, PA-C Taking Active   fexofenadine (ALLEGRA) 180 MG tablet [id number] Yes Take 180 mg by mouth daily as needed for allergies. [provider] Taking Active Self  finasteride (PROSCAR) 5 MG tablet [id number] Yes TAKE 1 TABLET BY MOUTH IN  THE MORNING Eubanks, Janene Harvey, NP Taking Active   furosemide (LASIX) 20 MG tablet [id number] Yes TAKE 1 TABLET BY MOUTH DAILY AS  NEEDED FOR LEG SWELLING Crenshaw, Madolyn Frieze, MD Taking Active   Iron, Ferrous Sulfate, 325 (65 Fe) MG TABS [id number] Yes Take 325 mg by mouth 2 (two) times daily. Sharon Seller, NP Taking Active   losartan (COZAAR) 25 MG tablet [id number] Yes TAKE 1 TABLET BY MOUTH DAILY  PLEASE KEEP SCHEDULED  APPOINTMENT WITH CARDIOLOGIST Lewayne Bunting, MD Taking Active   mexiletine (MEXITIL) 150 MG capsule [id number] Yes Take 1 capsule (150 mg total) by mouth 2 (two) times daily. Lewayne Bunting, MD Taking Active   pantoprazole (PROTONIX) 40 MG tablet [id number] Yes TAKE 1 TABLET BY MOUTH DAILY Sharon Seller, NP Taking Active   rosuvastatin (CRESTOR) 20 MG tablet [id number] Yes TAKE 1 TABLET BY MOUTH ONCE  DAILY FOR CHOLESTEROL Sharon Seller, NP Taking Active   UNABLE TO FIND [id number] No Take 1 tablet by mouth daily. Med Name: Inner ear health plus  Patient not taking: Reported on 05/27/2023   [provider] Not Taking Active            Med Note Lorenso Courier, Staci Dack M   Mon May 27, 2023 11:21 AM) Tinnitus since 88 yrs old  UNABLE TO FIND [id number] Yes Take 4 tablets by mouth daily. Med Name: Focus Factor [provider] Taking Active   warfarin (COUMADIN) 2.5 MG tablet [id number] Yes TAKE 1/2 TO 1 TABLET BY MOUTH  DAILY AS DIRECTED BY THE  COUMADIN CLINIC Lewayne Bunting, MD Taking Active   Med List Note Maple Hudson, Clinton D, MD 07/15/14 2050): CPAP Advanced AutoPap 6-12               Assessment/Plan:   Medication Management and Other Concerns: - Currently strategy sufficient to maintain appropriate adherence to prescribed medication regimen - For Medicaid, they report having family plan since May 2024 according to Podiatrist who gave them the information that popped up in their system- however did NOT have a phone number to call and ask questions     Follow Up Plan:  - No follow-up needed- just needed to discuss Jardiance and Medicaid concerns - Recommended patient bring BP cuff to visit today to verify accuracy - Called [facility] regarding [facility]- said they do NOT having any Medicaid card information or anything to pop up with an eligibility check - Completed full medication review today -  Called [employer] who reports they have NOT received anything regarding 2025 re-enrollment; sent message to Cardiology to complete next steps of re-enrollment process   Marlowe Aschoff, PharmD [facility] Phone Number: [phone]

## 2023-05-27 NOTE — Progress Notes (Signed)
Careteam: Patient Care Team: Sharon Seller, NP as PCP - General (Geriatric Medicine) Lewayne Bunting, MD as PCP - Cardiology (Cardiology) Lanier Prude, MD as PCP - Electrophysiology (Cardiology) Melina Fiddler, MD as Consulting Physician (Sports Medicine) Ernesto Rutherford, MD as Referring Physician (Ophthalmology) Clarene Duke, Karma Lew, RN as VBCI Care Management  PLACE OF SERVICE:  [facility]  Advanced Directive information Does Patient Have a Medical Advance Directive?: No, Would patient like information on creating a medical advance directive?: Yes (MAU/Ambulatory/Procedural Areas - Information given) (Given at a previous visit)  Allergies  Allergen Reactions   Compazine [Prochlorperazine Edisylate] Anxiety    Chief Complaint  Patient presents with   Transitions Of Care    Follow-up from 2 hour emergency room visit to the [facility] [place] on 05/15/23 for a head injury. Moderate fall risk. Compared blood pressure cuff readings. Patients cuff , 124/57 ours 134/76.     HPI: Patient is a 88 y.o. male to follow up fall He tripped and fell on his porch and fell and hit is head therefore went to the ED for evaluation.  He feels like his foot when between 2 boards. No other falls since.  No headache, no LOC, no blurred vision. He did not have any abrasions but was bruised afterwards.  When he went to the ED his INR was at goal, CT of head and cervical spine were normal.  He never got any headaches after visit, no vomiting, nausea.  Reports his right hip was sore from fall but able to ambulate without significant pain.    Did not like the effects of the appetite stimulant medication.  Reports he feels like appetite continues to be good.   Review of Systems:  Review of Systems  Constitutional:  Negative for chills, fever and weight loss.  HENT:  Negative for tinnitus.   Respiratory:  Negative for cough, sputum production and shortness of breath.    Cardiovascular:  Negative for chest pain, palpitations and leg swelling.  Gastrointestinal:  Negative for abdominal pain, constipation, diarrhea and heartburn.  Genitourinary:  Negative for dysuria, frequency and urgency.  Musculoskeletal:  Positive for falls. Negative for back pain, joint pain and myalgias.  Skin: Negative.   Neurological:  Negative for dizziness and headaches.  Psychiatric/Behavioral:  Negative for depression and memory loss. The patient does not have insomnia.     Past Medical History:  Diagnosis Date   Acute gastric ulcer without mention of hemorrhage, perforation, or obstruction    Allergic rhinitis, cause unspecified    Anemia, unspecified    Arthritis    Asthma    Blood transfusion without reported diagnosis    CAD (coronary artery disease)    Diaphragmatic hernia without mention of obstruction or gangrene    Elevated prostate specific antigen (PSA)    Enlarged prostate    Esophageal reflux    Extrinsic asthma, unspecified    Herpes zoster without mention of complication    Hyperlipidemia    Hypersomnia with sleep apnea, unspecified    Impotence of organic origin    Kyphosis    Lumbago    Obstructive sleep apnea (adult) (pediatric)    Pneumonia    hx of years ago    Senile osteoporosis    Trigger finger (acquired)    Tubular adenoma of colon 05/2013   Unspecified essential hypertension    Unspecified sleep apnea    CPAP- settings 4-12    Unspecified vitamin D deficiency  Past Surgical History:  Procedure Laterality Date   CARDIAC CATHETERIZATION  03/04/2009   Dr Dorris Fetch   COLONOSCOPY     CORONARY ARTERY BYPASS GRAFT  2010   CYSTOSCOPY WITH RETROGRADE PYELOGRAM, URETEROSCOPY AND STENT PLACEMENT Right 10/04/2013   Procedure: CYSTOSCOPY WITH RETROGRADE PYELOGRAM,  AND STENT PLACEMENT;  Surgeon: Jerilee Field, MD;  Location: [facility];  Service: Urology;  Laterality: Right;   DENTAL SURGERY N/A    MOLE REMOVAL  1958   chin   ROTATOR CUFF  REPAIR Left 04/1998   with bone spur removed, Dr Madelon Lips   TONSILLECTOMY  1945   TRANSURETHRAL RESECTION OF PROSTATE N/A 09/24/2013   Procedure: TRANSURETHRAL RESECTION OF THE PROSTATE (TURP) WITH GYRUS (STAGED RIGHT LATERAL AND MEDIAN LOBE);  Surgeon: Kathi Ludwig, MD;  Location: [facility];  Service: Urology;  Laterality: N/A;   UPPER GI ENDOSCOPY     Social History:   reports that he has never smoked. He has never been exposed to tobacco smoke. He has never used smokeless tobacco. He reports that he does not drink alcohol and does not use drugs.  Family History  Problem Relation Age of Onset   Stroke Mother    Breast cancer Mother    Cirrhosis Mother        wine   Rheumatic fever Mother    Kyphosis Mother    Hypertension Father    Stroke Father    Heart disease Father    Heart disease Paternal Grandfather    Colon cancer Neg Hx     Medications: Patient's Medications  New Prescriptions   No medications on file  Previous Medications   ALBUTEROL (VENTOLIN HFA) 108 (90 BASE) MCG/ACT INHALER    Inhale 1-2 puffs into the lungs every 6 (six) hours as needed for wheezing or shortness of breath.   ALUM & MAG HYDROXIDE-SIMETH (MAALOX MAX) 400-400-40 MG/5ML SUSPENSION    Take 10 mLs by mouth every 6 (six) hours as needed (pain with swallowing and reflux).   AMIODARONE (PACERONE) 200 MG TABLET    TAKE 1 TABLET BY MOUTH DAILY   ASCORBIC ACID (VITAMIN C) 1000 MG TABLET    Take 1,000 mg by mouth daily.   CALCIUM CITRATE-VITAMIN D (EQ CALCIUM CITRATE+D3 PO)    Take 1,200 mg by mouth daily.   CARVEDILOL (COREG) 3.125 MG TABLET    TAKE 1 TABLET BY MOUTH TWICE  DAILY   CHOLECALCIFEROL (VITAMIN D-3) 5000 UNITS TABS    Take 10,000 Units by mouth daily.   COENZYME Q10 (COQ10) 200 MG CAPS    Take 200 mg by mouth daily.   DOXAZOSIN (CARDURA) 2 MG TABLET    TAKE 1 TABLET BY MOUTH DAILY   EMPAGLIFLOZIN (JARDIANCE) 10 MG TABS TABLET    Take 1 tablet (10 mg total) by mouth daily before breakfast.    FAMOTIDINE (PEPCID) 20 MG TABLET    Take 1 tablet (20 mg total) by mouth 2 (two) times daily.   FEXOFENADINE (ALLEGRA) 180 MG TABLET    Take 180 mg by mouth daily as needed for allergies.   FINASTERIDE (PROSCAR) 5 MG TABLET    TAKE 1 TABLET BY MOUTH IN  THE MORNING   FUROSEMIDE (LASIX) 20 MG TABLET    TAKE 1 TABLET BY MOUTH DAILY AS  NEEDED FOR LEG SWELLING   IRON, FERROUS SULFATE, 325 (65 FE) MG TABS    Take 325 mg by mouth 2 (two) times daily.   LACTOSE FREE NUTRITION (BOOST) LIQD  Take 237 mLs by mouth every Monday, Wednesday, and Friday.   LOSARTAN (COZAAR) 25 MG TABLET    TAKE 1 TABLET BY MOUTH DAILY  PLEASE KEEP SCHEDULED  APPOINTMENT WITH CARDIOLOGIST   MEXILETINE (MEXITIL) 150 MG CAPSULE    Take 1 capsule (150 mg total) by mouth 2 (two) times daily.   PANTOPRAZOLE (PROTONIX) 40 MG TABLET    TAKE 1 TABLET BY MOUTH DAILY   ROSUVASTATIN (CRESTOR) 20 MG TABLET    TAKE 1 TABLET BY MOUTH ONCE  DAILY FOR CHOLESTEROL   UNABLE TO FIND    Take 1 tablet by mouth daily. Med Name: Inner ear health plus   UNABLE TO FIND    Take 2 tablets by mouth daily. Med Name: Focus Factor   WARFARIN (COUMADIN) 2.5 MG TABLET    TAKE 1/2 TO 1 TABLET BY MOUTH  DAILY AS DIRECTED BY THE  COUMADIN CLINIC  Modified Medications   No medications on file  Discontinued Medications   No medications on file    Physical Exam:  Vitals:   05/27/23 0856  BP: 134/76  Pulse: 96  Temp: (!) 97.3 F (36.3 C)  TempSrc: Temporal  SpO2: 99%  Height: 5\' 2"  (1.575 m)   Body mass index is 21.95 kg/m. Wt Readings from Last 3 Encounters:  05/15/23 120 lb (54.4 kg)  04/23/23 125 lb 9.6 oz (57 kg)  03/18/23 121 lb 9.6 oz (55.2 kg)    Physical Exam Constitutional:      General: He is not in acute distress.    Appearance: He is well-developed. He is not diaphoretic.  HENT:     Head: Normocephalic and atraumatic.     Right Ear: External ear normal.     Left Ear: External ear normal.     Mouth/Throat:     Pharynx: No  oropharyngeal exudate.  Eyes:     Conjunctiva/sclera: Conjunctivae normal.     Pupils: Pupils are equal, round, and reactive to light.  Cardiovascular:     Rate and Rhythm: Normal rate and regular rhythm.     Heart sounds: Normal heart sounds.  Pulmonary:     Effort: Pulmonary effort is normal.     Breath sounds: Normal breath sounds.  Abdominal:     General: Bowel sounds are normal.     Palpations: Abdomen is soft.  Musculoskeletal:        General: No tenderness.     Cervical back: Normal range of motion and neck supple.     Right lower leg: No edema.     Left lower leg: No edema.  Skin:    General: Skin is warm and dry.     Findings: Bruising (noted to medial aspect of rigt thigh) present.  Neurological:     Mental Status: He is alert and oriented to person, place, and time.     Labs reviewed: Basic Metabolic Panel: Recent Labs    08/31/22 1515 10/21/22 0149 10/22/22 1557 01/18/23 1450  NA 138 137 139 141  K 3.9 4.3 4.4 4.3  CL 103 105 104 106  CO2 23 23 28 25   GLUCOSE 128* 111* 89 50*  BUN 25 30* 34* 29*  CREATININE 1.16 1.70* 1.36* 1.09  CALCIUM 8.8 9.5 9.3 8.9  TSH 1.200  --   --  0.957   Liver Function Tests: Recent Labs    08/31/22 1515 01/18/23 1450  AST 27 22  ALT 23 23  ALKPHOS 59 59  BILITOT 1.0 0.6  PROT 5.8*  6.1  ALBUMIN 4.0 3.7   No results for input(s): "LIPASE", "AMYLASE" in the last 8760 hours. No results for input(s): "AMMONIA" in the last 8760 hours. CBC: Recent Labs    09/14/22 1516 10/21/22 0149 10/22/22 1557 01/18/23 1450  WBC 4.9 6.2 9.8 4.7  NEUTROABS 3,548 3.9 7,820*  --   HGB 12.2* 12.7* 12.6* 11.9*  HCT 36.0* 37.9* 36.8* 33.2*  MCV 94.7 92.0 92.9 99*  PLT 161 217 235 198   Lipid Panel: Recent Labs    01/18/23 1450  CHOL 116  HDL 58  LDLCALC 46  TRIG 49  CHOLHDL 2.0   TSH: Recent Labs    08/31/22 1515 01/18/23 1450  TSH 1.200 0.957   A1C: Lab Results  Component Value Date   HGBA1C 5.6 07/28/2012      Assessment/Plan 1. Essential hypertension (Primary) -blood pressure is well controlled, home cuff consistant with manual.   2. Fall, subsequent encounter -no further falls or near falls.  Work up in the ED unremarkable and no worsening symptoms.  Bruising noted from fall but healing at this time Continue with fall precautions   3. Poor appetite Has improved, he did not like the effects of the remeron therefore stopped, weight down since last visit. Will continue to monitor.  -encouraged to liberalize diet- to eat 3 meals daily, To have protein supplement in addition to smallest meal of the day.    Janene Harvey. [facility] [phone]

## 2023-05-28 ENCOUNTER — Telehealth: Payer: Self-pay | Admitting: Pharmacist Clinician (PhC)/ Clinical Pharmacy Specialist

## 2023-05-28 NOTE — Telephone Encounter (Signed)
Signed up for [facility], will leave paper copy for patient, comes to INR check on Friday.

## 2023-05-31 ENCOUNTER — Ambulatory Visit: Payer: Medicare Other | Attending: Internal Medicine

## 2023-05-31 DIAGNOSIS — Z5181 Encounter for therapeutic drug level monitoring: Secondary | ICD-10-CM

## 2023-05-31 DIAGNOSIS — I4891 Unspecified atrial fibrillation: Secondary | ICD-10-CM

## 2023-05-31 LAB — POCT INR: INR: 2.4 (ref 2.0–3.0)

## 2023-05-31 NOTE — Patient Instructions (Signed)
continue taking 1/2 tablet daily, EXCEPT 1 tablet on Mondays and Fridays.  Stay consistent with boost (Mon, Wed, and Fri)  Recheck INR in 4 weeks. [facility] [phone]

## 2023-06-04 ENCOUNTER — Telehealth: Payer: Self-pay | Admitting: *Deleted

## 2023-06-04 NOTE — Progress Notes (Unsigned)
Complex Care Management Care Guide Note  06/04/2023 Name: [name] MRN: [id number] DOB: [birth year]  Brent Griffith is a 88 y.o. year old male who is a primary care patient of Sharon Seller, NP and is actively engaged with the care management team. I reached out to [name] by phone today to assist with re-scheduling  with the RN Case Manager.  Follow up plan: Unsuccessful telephone outreach attempt made. A HIPAA compliant phone message was left for the patient providing contact information and requesting a return call.  [name]  [facility]  [facility], [facility] Guide  Direct Dial: [phone]  Fax [phone]

## 2023-06-05 ENCOUNTER — Telehealth: Payer: Self-pay | Admitting: Cardiology

## 2023-06-05 MED ORDER — EMPAGLIFLOZIN 10 MG PO TABS: 10.0000 mg | ORAL_TABLET | Freq: Every day | ORAL | 2 refills | Status: DC

## 2023-06-05 NOTE — Telephone Encounter (Signed)
*  STAT* If patient is at the pharmacy, call can be transferred to refill team.   1. Which medications need to be refilled? (please list name of each medication and dose if known)   empagliflozin (JARDIANCE) 10 MG TABS tablet    2. Which pharmacy/location (including street and city if local pharmacy) is medication to be sent to? [id number] - [place], [place] - [address]   3. Do they need a 30 day or 90 day supply? 90

## 2023-06-05 NOTE — Progress Notes (Signed)
Complex Care Management Care Guide Note  06/05/2023 Name: Brent Griffith MRN: [id number] DOB: April 23, 1936  Brent Griffith is a 88 y.o. year old male who is a primary care patient of Sharon Seller, NP and is actively engaged with the care management team. I reached out to Brent Griffith by phone today to assist with re-scheduling  with the RN Case Manager.  Follow up plan: Telephone appointment with complex care management team member scheduled for:  2/26 [name]  [name]  [facility], [facility] Guide  Direct Dial: [phone]  Fax [phone]

## 2023-06-07 ENCOUNTER — Encounter: Payer: Self-pay | Admitting: Licensed Clinical Social Worker

## 2023-06-07 ENCOUNTER — Telehealth: Payer: Self-pay | Admitting: *Deleted

## 2023-06-07 NOTE — Progress Notes (Signed)
Complex Care Management Care Guide Note  06/07/2023 Name: Brent Griffith MRN: [id number] DOB: [birth year]  Brent Griffith is a 88 y.o. year old male who is a primary care patient of Brent Seller, Brent Griffith and is actively engaged with the care management team.  Brent Griffith called by phone today to assist with re-scheduling  with the BSW.  Follow up plan: Telephone appointment with complex care management team member scheduled for:  06/24/23  Brent Griffith  [facility]  [facility], [facility] Guide  Direct Dial: [phone]  Fax [phone]

## 2023-06-07 NOTE — Progress Notes (Signed)
Complex Care Management Care Guide Note  06/07/2023 Name: Brent Griffith MRN: [id number] DOB: 1935/05/23  Brent Griffith is a 88 y.o. year old male who is a primary care patient of Brent Seller, NP and is actively engaged with the care management team. I reached out to Brent Griffith by phone today to assist with re-scheduling  with the BSW.  Follow up plan: Unsuccessful telephone outreach attempt made. A HIPAA compliant phone message was left for the patient providing contact information and requesting a return call.  Brent Griffith  [facility]  Value-Based Care Institute, [facility] Guide  Direct Dial: [phone]  Fax [phone]

## 2023-06-20 ENCOUNTER — Encounter: Payer: Self-pay | Admitting: Podiatry

## 2023-06-20 ENCOUNTER — Ambulatory Visit: Payer: Medicare Other | Admitting: Podiatry

## 2023-06-20 DIAGNOSIS — M79674 Pain in right toe(s): Secondary | ICD-10-CM

## 2023-06-20 DIAGNOSIS — Z7901 Long term (current) use of anticoagulants: Secondary | ICD-10-CM

## 2023-06-20 DIAGNOSIS — B351 Tinea unguium: Secondary | ICD-10-CM

## 2023-06-20 DIAGNOSIS — M79675 Pain in left toe(s): Secondary | ICD-10-CM | POA: Diagnosis not present

## 2023-06-20 NOTE — Progress Notes (Signed)
  Subjective:  Patient ID: Brent Griffith, male    DOB: July 09, 1935,   MRN: [id number]  Chief Complaint  Patient presents with   Nail Problem    rfc    88 y.o. male presents for concern of thickened elongated and painful nails that are difficult to trim. Requesting to have them trimmed today. He is on coumadin and at risk for foot care.   PCP:  Sharon Seller, NP   .  Marland KitchenHe is on coumadin.  Denies any other pedal complaints. Denies n/v/f/c.   Past Medical History:  Diagnosis Date   Acute gastric ulcer without mention of hemorrhage, perforation, or obstruction    Allergic rhinitis, cause unspecified    Anemia, unspecified    Arthritis    Asthma    Blood transfusion without reported diagnosis    CAD (coronary artery disease)    Diaphragmatic hernia without mention of obstruction or gangrene    Elevated prostate specific antigen (PSA)    Enlarged prostate    Esophageal reflux    Extrinsic asthma, unspecified    Herpes zoster without mention of complication    Hyperlipidemia    Hypersomnia with sleep apnea, unspecified    Impotence of organic origin    Kyphosis    Lumbago    Obstructive sleep apnea (adult) (pediatric)    Pneumonia    hx of years ago    Senile osteoporosis    Trigger finger (acquired)    Tubular adenoma of colon 05/2013   Unspecified essential hypertension    Unspecified sleep apnea    CPAP- settings 4-12    Unspecified vitamin D deficiency     Objective:  Physical Exam: Vascular: DP/PT pulses 2/4 bilateral. CFT <3 seconds. Normal hair growth on digits. No edema.  Skin. No lacerations or abrasions bilateral feet. Nails 1-5 bilateral are thickened elongated and dsytrophic with subungual debris. Musculoskeletal: MMT 5/5 bilateral lower extremities in DF, PF, Inversion and Eversion. Deceased ROM in DF of ankle joint.  Neurological: Sensation intact to light touch.   Assessment:   1. Pain due to onychomycosis of toenails of both feet   2. Chronic  anticoagulation      Plan:  Patient was evaluated and treated and all questions answered.  -Mechanically debrided all nails 1-5 bilateral using sterile nail nipper and filed with dremel without incident  -Answered all patient questions -Patient to return  in 3 months for at risk foot care -Patient advised to call the office if any problems or questions arise in the meantime.   Louann Sjogren, DPM

## 2023-06-24 ENCOUNTER — Encounter: Payer: Self-pay | Admitting: Licensed Clinical Social Worker

## 2023-06-24 ENCOUNTER — Telehealth: Payer: Self-pay | Admitting: *Deleted

## 2023-06-24 NOTE — Progress Notes (Signed)
 Complex Care Management Care Guide Note  06/24/2023 Name: NICOLIS BOODY MRN: [id number] DOB: 01/15/1936  Aloha Gell Sinclair is a 88 y.o. year old male who is a primary care patient of Sharon Seller, NP and is actively engaged with the care management team. I reached out to [name] by phone today to assist with re-scheduling  with the BSW.  Follow up plan: Telephone appointment with complex care management team member scheduled for:  2/19 [name]  [facility]  Value-Based Care Institute, [facility] Guide  Direct Dial: [phone]  Fax [phone]

## 2023-06-26 ENCOUNTER — Ambulatory Visit: Payer: Self-pay | Admitting: Licensed Clinical Social Worker

## 2023-06-26 NOTE — Patient Instructions (Signed)
 Visit Information  Thank you for taking time to visit with me today. Please don't hesitate to contact me if I can be of assistance to you.   Following are the goals we discussed today:   Goals Addressed             This Visit's Progress    COMPLETED: Care Coordination Activities       Care Coordination Interventions: Patient stated that he lives with his wife and that they are ok with food but could use some more, the patient stated that he does not receive EBT and has not applied but did accept the offer of the SW mailing out the food pantry list.  Patient wanted inforamtion on what Medicaid would pay for and it they would pay for certain medications such as his Jardiance, Sw provided the Medicaid number the patient stated that he could call and the SW reminded the patient that he has an upcoming appointment with the RN Little and the patient stated that he needed to reschedule that appointment due to his wife having an appointment on the same date and time.  SW completed the SDOH tool and there were no other needs at this time SW will follow on on 06/07/2023 at 2:30 pm        No further follow need, SW encouraged the patient to contact the PCP if SW needs arise in the future.   Please call the care guide team at [phone] if you need to cancel or reschedule your appointment.   If you are experiencing a Mental Health or Behavioral Health Crisis or need someone to talk to, please call the Suicide and Crisis Lifeline: 988 go to [facility] [address] [place] [phone]) call 911  Patient verbalizes understanding of instructions and care plan provided today and agrees to view in MyChart. Active MyChart status and patient understanding of how to access instructions and care plan via MyChart confirmed with patient.     Jeanie Cooks, PhD [facility], [facility] Social Worker Direct Dial:  [phone]  Fax: [phone]

## 2023-06-26 NOTE — Patient Outreach (Signed)
  Care Coordination   Follow Up Visit Note   06/26/2023 Name: Brent Griffith MRN: [id number] DOB: 1936/02/09  Brent Griffith is a 88 y.o. year old male who sees Eubanks, Janene Harvey, NP for primary care. I spoke with  Brent Griffith by phone today.  What matters to the patients health and wellness today?  Food Insecurities and Medicaid     Goals Addressed             This Visit's Progress    COMPLETED: Care Coordination Activities       Care Coordination Interventions: Patient stated that he lives with his wife and that they are ok with food but could use some more, the patient stated that he does not receive EBT and has not applied but did accept the offer of the SW mailing out the food pantry list.  Patient wanted inforamtion on what Medicaid would pay for and it they would pay for certain medications such as his Jardiance, Sw provided the Medicaid number the patient stated that he could call and the SW reminded the patient that he has an upcoming appointment with the RN Little and the patient stated that he needed to reschedule that appointment due to his wife having an appointment on the same date and time.  SW completed the SDOH tool and there were no other needs at this time SW will follow on on 06/07/2023 at 2:30 pm        SDOH assessments and interventions completed:  Yes  SDOH Interventions Today    Flowsheet Row Most Recent Value  SDOH Interventions   Food Insecurity Interventions Intervention Not Indicated  [Received the resources]  Housing Interventions Intervention Not Indicated  Transportation Interventions Intervention Not Indicated  Utilities Interventions Intervention Not Indicated        Care Coordination Interventions:  Yes, provided  Interventions Today    Flowsheet Row Most Recent Value  General Interventions   General Interventions Discussed/Reviewed General Interventions Reviewed, Science writer received [employer] and stated  that Medicaid did pay for his Jardiance]        Follow up plan: No further intervention required.   Encounter Outcome:  Patient Visit Completed   Jeanie Cooks, PhD [facility], [facility] Social Worker Direct Dial: [phone]  Fax: [phone]

## 2023-06-28 ENCOUNTER — Ambulatory Visit: Payer: Medicare Other | Attending: Cardiology

## 2023-06-28 DIAGNOSIS — I4891 Unspecified atrial fibrillation: Secondary | ICD-10-CM

## 2023-06-28 DIAGNOSIS — Z5181 Encounter for therapeutic drug level monitoring: Secondary | ICD-10-CM

## 2023-06-28 LAB — POCT INR: INR: 2.4 (ref 2.0–3.0)

## 2023-06-28 NOTE — Patient Instructions (Signed)
 continue taking 1/2 tablet daily, EXCEPT 1 tablet on Mondays and Fridays.  Stay consistent with boost (Mon, Wed, and Fri)  Recheck INR in 4 weeks. [facility] [phone]

## 2023-07-03 ENCOUNTER — Ambulatory Visit: Payer: Self-pay

## 2023-07-03 DIAGNOSIS — I4891 Unspecified atrial fibrillation: Secondary | ICD-10-CM

## 2023-07-03 DIAGNOSIS — I10 Essential (primary) hypertension: Secondary | ICD-10-CM

## 2023-07-03 NOTE — Patient Outreach (Signed)
 Care Coordination   Follow Up Visit Note   07/03/2023 Name: Brent Griffith MRN: [id number] DOB: 11-May-1935  Brent Griffith is a 88 y.o. year old male who sees Eubanks, Janene Harvey, NP for primary care. I spoke with  Brent Griffith by phone today.  What matters to the patients health and wellness today?  Patient would like to contact the Medicaid office to ask questions about his benefits.     Goals Addressed             This Visit's Progress    To continue to manage Afib without complications       Care Coordination Interventions: Evaluation of current treatment plan related to Afib and patient's adherence to plan as established by provider  Reviewed importance of adherence to anticoagulant exactly as prescribed Counseled on bleeding risk associated with warfarin and importance of self-monitoring for signs/symptoms of bleeding Counseled on seeking medical attention after a head injury or if there is blood in the urine/stool Reviewed and discussed next upcoming scheduled warfarin check Discussed plans with patient for ongoing care coordination follow up and provided patient with direct contact information for nurse care coordinator     To have no more falls   On track    Care Coordination Interventions: Provided written and verbal education re: potential causes of falls and Fall prevention strategies Advised patient of importance of notifying provider of falls Assessed for falls since last encounter Assessed patients knowledge of fall risk prevention secondary to previously provided education     To maintain muscle mass by increasing protein   On track    Care Coordination Interventions: Evaluation of current treatment plan related to Nutritional Imbalance and patient's adherence to plan as established by provider Determined patient discontinued his Mirtazapine due to having bothersome SE, his PCP is aware Discussed patient continues to drink Boost, although, he currently does not have  Ensure on hand, mailed coupons Encouraged patient to continue drinking Boost/Ensure between meals for added protein  Instructed patient to keep his doctor informed of persistent weight loss  Discussed plans with patient for ongoing care coordination follow up and provided patient with direct contact information for nurse care coordinator Body mass index is 21.95 kg/m.    Wt Readings from Last 3 Encounters:  05/15/23 120 lb (54.4 kg)  04/23/23 125 lb 9.6 oz (57 kg)  03/18/23 121 lb 9.6 oz (55.2 kg)       Interventions Today    Flowsheet Row Most Recent Value  Chronic Disease   Chronic disease during today's visit Atrial Fibrillation (AFib), Other  [s/p fall]  General Interventions   General Interventions Discussed/Reviewed General Interventions Discussed, General Interventions Reviewed, Doctor Visits, Labs  Doctor Visits Discussed/Reviewed Doctor Visits Discussed, Doctor Visits Reviewed, PCP, Specialist  Exercise Interventions   Exercise Discussed/Reviewed Weight Managment, Physical Activity  Physical Activity Discussed/Reviewed Physical Activity Reviewed, Physical Activity Discussed  Weight Management Weight maintenance  Education Interventions   Education Provided Provided Education, Provided Printed Education  Provided Verbal Education On Labs, When to see the doctor, Medication, Nutrition  Nutrition Interventions   Nutrition Discussed/Reviewed Nutrition Discussed, Nutrition Reviewed, Increasing proteins, Supplemental nutrition  Pharmacy Interventions   Pharmacy Dicussed/Reviewed Pharmacy Topics Discussed, Pharmacy Topics Reviewed, Medications and their functions  Safety Interventions   Safety Discussed/Reviewed Safety Reviewed, Safety Discussed, Fall Risk, Home Safety  Home Safety Assistive Devices              SDOH assessments and interventions completed:  No  Care Coordination Interventions:  Yes, provided   Follow up plan: Follow up call scheduled for  07/31/23 @1 :00 PM    Encounter Outcome:  Patient Visit Completed

## 2023-07-03 NOTE — Patient Instructions (Signed)
 Visit Information  Thank you for taking time to visit with me today. Please don't hesitate to contact me if I can be of assistance to you.   Following are the goals we discussed today:   Goals Addressed             This Visit's Progress    To continue to manage Afib without complications       Care Coordination Interventions: Evaluation of current treatment plan related to Afib and patient's adherence to plan as established by provider  Reviewed importance of adherence to anticoagulant exactly as prescribed Counseled on bleeding risk associated with warfarin and importance of self-monitoring for signs/symptoms of bleeding Counseled on seeking medical attention after a head injury or if there is blood in the urine/stool Reviewed and discussed next upcoming scheduled warfarin check Discussed plans with patient for ongoing care coordination follow up and provided patient with direct contact information for nurse care coordinator         To have no more falls   On track    Care Coordination Interventions: Provided written and verbal education re: potential causes of falls and Fall prevention strategies Advised patient of importance of notifying provider of falls Assessed for falls since last encounter Assessed patients knowledge of fall risk prevention secondary to previously provided education        To maintain muscle mass by increasing protein   On track    Care Coordination Interventions: Evaluation of current treatment plan related to Nutritional Imbalance and patient's adherence to plan as established by provider Determined patient discontinued his Mirtazapine due to having bothersome SE, his PCP is aware Discussed patient continues to drink Boost, although, he currently does not have Ensure on hand, mailed coupons Encouraged patient to continue drinking Boost/Ensure between meals for added protein  Instructed patient to keep his doctor informed of persistent weight loss   Discussed plans with patient for ongoing care coordination follow up and provided patient with direct contact information for nurse care coordinator Body mass index is 21.95 kg/m.    Wt Readings from Last 3 Encounters:  05/15/23 120 lb (54.4 kg)  04/23/23 125 lb 9.6 oz (57 kg)  03/18/23 121 lb 9.6 oz (55.2 kg)           Our next appointment is by telephone on 07/31/23 at 1:00 PM  Please call the care guide team at [phone] if you need to cancel or reschedule your appointment.   If you are experiencing a Mental Health or Behavioral Health Crisis or need someone to talk to, please call 1-800-273-TALK (toll free, 24 hour hotline)  Patient verbalizes understanding of instructions and care plan provided today and agrees to view in MyChart. Active MyChart status and patient understanding of how to access instructions and care plan via MyChart confirmed with patient.     Delsa Sale RN BSN CCM [place]  [facility], [facility] Health Nurse Care Coordinator  Direct Dial: [phone] Website: [email or web address]

## 2023-07-08 ENCOUNTER — Telehealth: Payer: Self-pay

## 2023-07-08 NOTE — Progress Notes (Signed)
 Complex Care Management Note Care Guide Note  07/08/2023 Name: Brent Griffith MRN: [id number] DOB: June 28, 1935   Complex Care Management Outreach Attempts: An unsuccessful telephone outreach was attempted today to offer the patient information about available complex care management services.  Follow Up Plan:  Additional outreach attempts will be made to offer the patient complex care management information and services.   Encounter Outcome:  No Answer  Alletta Mattos [facility]  [facility] Guide Direct Dial: [phone]  Fax: [phone] Website: [email or web address]

## 2023-07-09 ENCOUNTER — Telehealth: Payer: Self-pay

## 2023-07-09 NOTE — Progress Notes (Signed)
 Complex Care Management Note Care Guide Note  07/09/2023 Name: Brent Griffith MRN: [id number] DOB: May 07, 1936  [name] is a 88 y.o. year old male who is a primary care patient of Sharon Seller, NP . The community resource team was consulted for assistance with  yard cleanup.  SDOH screenings and interventions completed:  Yes  Social Drivers of Health From This Encounter   Food Insecurity: No Food Insecurity (07/09/2023)   Hunger Vital Sign    Worried About Running Out of Food in the Last Year: Never true    Ran Out of Food in the Last Year: Never true  Housing: Low Risk  (07/09/2023)   Housing Stability Vital Sign    Unable to Pay for Housing in the Last Year: No    Number of Times Moved in the Last Year: 0    Homeless in the Last Year: No  Financial Resource Strain: Low Risk  (07/09/2023)   Overall Financial Resource Strain (CARDIA)    Difficulty of Paying Living Expenses: Not very hard  Transportation Needs: No Transportation Needs (07/09/2023)   PRAPARE - Administrator, Civil Service (Medical): No    Lack of Transportation (Non-Medical): No  Utilities: Not At Risk (07/09/2023)   Utilities    Threatened with loss of utilities: No       Care guide performed the following interventions: Called patient patient to inform him I contacted several agencies and was unable to find assistance for him with yard cleanup.  He stated a neighbor has offered to help clean the yard.                                                                                                                       Called the following agencies [employer], [employer] [place], [employer] and [employer] none of them offer yard cleanup assistance.  Follow Up Plan:  No further follow up planned at this time. The patient has been provided with needed resources.  Encounter Outcome:  Patient Visit Completed  Chief Walkup [facility]   [facility] Guide Direct Dial: [phone]  Fax: [phone] Website: [email or web address]

## 2023-07-17 ENCOUNTER — Telehealth: Payer: Self-pay | Admitting: Cardiology

## 2023-07-17 DIAGNOSIS — I5022 Chronic systolic (congestive) heart failure: Secondary | ICD-10-CM

## 2023-07-17 NOTE — Telephone Encounter (Signed)
 Patient calling to get a order sent in for him to get a rollator walker. Fax number [phone]. Please advise

## 2023-07-18 NOTE — Telephone Encounter (Signed)
 Order placed and faxed to the number provided.

## 2023-07-24 ENCOUNTER — Ambulatory Visit: Payer: Medicare Other

## 2023-07-26 ENCOUNTER — Other Ambulatory Visit: Payer: Self-pay | Admitting: Nurse Practitioner

## 2023-07-31 ENCOUNTER — Ambulatory Visit: Payer: Self-pay

## 2023-07-31 ENCOUNTER — Ambulatory Visit

## 2023-07-31 ENCOUNTER — Telehealth: Payer: Self-pay | Admitting: *Deleted

## 2023-07-31 NOTE — Patient Instructions (Signed)
 Visit Information  Thank you for taking time to visit with me today. Please don't hesitate to contact me if I can be of assistance to you.   Following are the goals we discussed today:   Goals Addressed             This Visit's Progress    COMPLETED: To continue to manage Afib without complications       Care Coordination Interventions: Evaluation of current treatment plan related to Afib and patient's adherence to plan as established by provider  Determined patient continues to go to the Coumadin clinic for INR checks, reports his values have been at target range Discussed plans with patient for ongoing care coordination follow up and provided patient with direct contact information for nurse care coordinator         To have no more falls   On track    Care Coordination Interventions: Provided written and verbal education re: potential causes of falls and Fall prevention strategies Advised patient of importance of notifying provider of falls Assessed for falls since last encounter, patient denies  Assessed patients knowledge of fall risk prevention secondary to previously provided education Reviewed and discussed patient needs a rollator, he is currently using his wife's walker that is not in good shape and has 4 wheels Reviewed and discussed order placed for rollator was placed by Dr. Jens Som and sent to [facility], discussed patient will will require a face to face visit before getting approved for the rollator Reviewed and discussed patient is scheduled to f/u with Dr. Jens Som again on 09/12/23 @09 :40 AM Discussed plans with patient for ongoing care coordination follow up and provided patient with direct contact information for nurse care coordinator Scheduled nurse follow up call with patient for 09/18/23 @1 :00 PM        To maintain muscle mass by increasing protein   On track    Care Coordination Interventions: Evaluation of current treatment plan related to Nutritional  Imbalance and patient's adherence to plan as established by provider Discussed patient continues to drink Boost or Ensure as protein supplementation at least 3 time weekly Sent letter and request for Ensure or Boost coupons to be mailed to patient Encouraged patient to continue drinking Boost/Ensure between meals for added protein  Discussed patient's weight today at home is 124.5 lbs  Instructed patient to keep his doctor informed of persistent weight loss  Discussed plans with patient for ongoing care coordination follow up and provided patient with direct contact information for nurse care coordinator Body mass index is 21.95 kg/m.    Wt Readings from Last 3 Encounters:  05/15/23 120 lb (54.4 kg)  04/23/23 125 lb 9.6 oz (57 kg)  03/18/23 121 lb 9.6 oz (55.2 kg)           Our next appointment is by telephone on 09/18/23 at 1:00 PM  Please call the care guide team at [phone] if you need to cancel or reschedule your appointment.   If you are experiencing a Mental Health or Behavioral Health Crisis or need someone to talk to, please call 1-800-273-TALK (toll free, 24 hour hotline)  Patient verbalizes understanding of instructions and care plan provided today and agrees to view in MyChart. Active MyChart status and patient understanding of how to access instructions and care plan via MyChart confirmed with patient.     Delsa Sale RN BSN CCM [place]  [facility], [facility] Health Nurse Care Coordinator  Direct Dial: [phone] Website: [email or web address]

## 2023-07-31 NOTE — Patient Outreach (Signed)
 Care Coordination   Follow Up Visit Note   07/31/2023 Name: Brent Griffith MRN: [id number] DOB: 04/19/1936  Brent Griffith is a 88 y.o. year old male who sees Eubanks, Janene Harvey, NP for primary care. I spoke with  Brent Griffith by phone today.  What matters to the patients health and wellness today?  Patient would like to obtain a rollator for ambulation to help avoid from having recurrent falls.     Goals Addressed             This Visit's Progress    COMPLETED: To continue to manage Afib without complications       Care Coordination Interventions: Evaluation of current treatment plan related to Afib and patient's adherence to plan as established by provider  Determined patient continues to go to the Coumadin clinic for INR checks, reports his values have been at target range Discussed plans with patient for ongoing care coordination follow up and provided patient with direct contact information for nurse care coordinator      To have no more falls   On track    Care Coordination Interventions: Provided written and verbal education re: potential causes of falls and Fall prevention strategies Advised patient of importance of notifying provider of falls Assessed for falls since last encounter, patient denies  Assessed patients knowledge of fall risk prevention secondary to previously provided education Reviewed and discussed patient needs a rollator, he is currently using his wife's walker that is not in good shape and has 4 wheels Reviewed and discussed order placed for rollator was placed by Dr. Jens Som and sent to [facility], discussed patient will will require a face to face visit before getting approved for the rollator Reviewed and discussed patient is scheduled to f/u with Dr. Jens Som again on 09/12/23 @09 :40 AM Discussed plans with patient for ongoing care coordination follow up and provided patient with direct contact information for nurse care coordinator Scheduled nurse  follow up call with patient for 09/18/23 @1 :00 PM      To maintain muscle mass by increasing protein   On track    Care Coordination Interventions: Evaluation of current treatment plan related to Nutritional Imbalance and patient's adherence to plan as established by provider Discussed patient continues to drink Boost or Ensure as protein supplementation at least 3 time weekly Sent letter and request for Ensure or Boost coupons to be mailed to patient Encouraged patient to continue drinking Boost/Ensure between meals for added protein  Discussed patient's weight today at home is 124.5 lbs  Instructed patient to keep his doctor informed of persistent weight loss  Discussed plans with patient for ongoing care coordination follow up and provided patient with direct contact information for nurse care coordinator Body mass index is 21.95 kg/m.    Wt Readings from Last 3 Encounters:  05/15/23 120 lb (54.4 kg)  04/23/23 125 lb 9.6 oz (57 kg)  03/18/23 121 lb 9.6 oz (55.2 kg)       Interventions Today    Flowsheet Row Most Recent Value  Chronic Disease   Chronic disease during today's visit Atrial Fibrillation (AFib), Other  [impaired physical mobility,  frality]  General Interventions   General Interventions Discussed/Reviewed General Interventions Reviewed, General Interventions Discussed, Doctor Visits, Labs, Durable Medical Equipment (DME)  Doctor Visits Discussed/Reviewed Doctor Visits Reviewed, Doctor Visits Discussed, PCP  Durable Medical Equipment (DME) Walker  Exercise Interventions   Exercise Discussed/Reviewed Physical Activity  Physical Activity Discussed/Reviewed Physical Activity Reviewed, Physical Activity  Discussed  Education Interventions   Education Provided Provided Education, Provided Dance movement psychotherapist On When to see the doctor, Medication, Labs, [facility], Nutrition  [provided patient with information related to the [facility] Medicaid walk  in clinic]  Nutrition Interventions   Nutrition Discussed/Reviewed Nutrition Discussed, Nutrition Reviewed, Increasing proteins  [mailed Ensure coupons]  Pharmacy Interventions   Pharmacy Dicussed/Reviewed Pharmacy Topics Reviewed, Pharmacy Topics Discussed  Safety Interventions   Safety Discussed/Reviewed Home Safety, Fall Risk, Safety Reviewed, Safety Discussed  Home Safety Assistive Devices          SDOH assessments and interventions completed:  No     Care Coordination Interventions:  Yes, provided   Follow up plan: Follow up call scheduled for 09/18/23 @1 :00 PM    Encounter Outcome:  Patient Visit Completed

## 2023-07-31 NOTE — Telephone Encounter (Signed)
 Spoke with pt, aware per [employer], patient would need a face to face appointment before he would qualify for the roller walker. He has a follow up appointment with dr Jens Som in may and will wait until that appointment.

## 2023-08-07 ENCOUNTER — Telehealth: Payer: Self-pay | Admitting: Cardiology

## 2023-08-07 ENCOUNTER — Ambulatory Visit: Attending: Cardiology

## 2023-08-07 DIAGNOSIS — I4891 Unspecified atrial fibrillation: Secondary | ICD-10-CM | POA: Diagnosis not present

## 2023-08-07 DIAGNOSIS — Z5181 Encounter for therapeutic drug level monitoring: Secondary | ICD-10-CM | POA: Diagnosis not present

## 2023-08-07 LAB — POCT INR: INR: 1.8 — AB (ref 2.0–3.0)

## 2023-08-07 MED ORDER — AMIODARONE HCL 200 MG PO TABS: 200.0000 mg | ORAL_TABLET | Freq: Every day | ORAL | 1 refills | Status: DC

## 2023-08-07 NOTE — Telephone Encounter (Signed)
 Pt's medication was sent to pt's pharmacy as requested. Confirmation received.

## 2023-08-07 NOTE — Telephone Encounter (Signed)
*  STAT* If patient is at the pharmacy, call can be transferred to refill team.   1. Which medications need to be refilled? (please list name of each medication and dose if known)   amiodarone (PACERONE) 200 MG tablet   2. Would you like to learn more about the convenience, safety, & potential cost savings by using the [facility]?   3. Are you open to using the [facility] (Type [facility]. ).  4. Which pharmacy/location (including street and city if local pharmacy) is medication to be sent to?  [facility] [id number] - [place], [place] - [address]   5. Do they need a 30 day or 90 day supply?   90 day  Wife Margaretha Glassing) stated patient is completely out of this medication.  Patient has appointment scheduled on 5/8 with Dr. Jens Som.

## 2023-08-07 NOTE — Patient Instructions (Signed)
 Description   Take 1 tablet today and then continue taking 1/2 tablet daily, EXCEPT 1 tablet on Mondays and Fridays.  Stay consistent with boost (Mon, Wed, and Fri)  Recheck INR in 3 weeks. Coumadin Clinic [phone]

## 2023-08-22 ENCOUNTER — Emergency Department (HOSPITAL_BASED_OUTPATIENT_CLINIC_OR_DEPARTMENT_OTHER)

## 2023-08-22 ENCOUNTER — Other Ambulatory Visit: Payer: Self-pay

## 2023-08-22 ENCOUNTER — Encounter (HOSPITAL_BASED_OUTPATIENT_CLINIC_OR_DEPARTMENT_OTHER): Payer: Self-pay | Admitting: Emergency Medicine

## 2023-08-22 ENCOUNTER — Emergency Department (HOSPITAL_BASED_OUTPATIENT_CLINIC_OR_DEPARTMENT_OTHER)
Admission: EM | Admit: 2023-08-22 | Discharge: 2023-08-22 | Disposition: A | Discharge status: Home / Self Care | Attending: Emergency Medicine | Admitting: Emergency Medicine

## 2023-08-22 DIAGNOSIS — B9789 Other viral agents as the cause of diseases classified elsewhere: Secondary | ICD-10-CM | POA: Diagnosis not present

## 2023-08-22 DIAGNOSIS — J069 Acute upper respiratory infection, unspecified: Secondary | ICD-10-CM | POA: Insufficient documentation

## 2023-08-22 DIAGNOSIS — Z79899 Other long term (current) drug therapy: Secondary | ICD-10-CM | POA: Diagnosis not present

## 2023-08-22 DIAGNOSIS — R0602 Shortness of breath: Secondary | ICD-10-CM | POA: Diagnosis not present

## 2023-08-22 DIAGNOSIS — K224 Dyskinesia of esophagus: Secondary | ICD-10-CM | POA: Diagnosis not present

## 2023-08-22 DIAGNOSIS — Z951 Presence of aortocoronary bypass graft: Secondary | ICD-10-CM | POA: Insufficient documentation

## 2023-08-22 DIAGNOSIS — R059 Cough, unspecified: Secondary | ICD-10-CM | POA: Diagnosis not present

## 2023-08-22 DIAGNOSIS — J449 Chronic obstructive pulmonary disease, unspecified: Secondary | ICD-10-CM | POA: Diagnosis not present

## 2023-08-22 DIAGNOSIS — Z7901 Long term (current) use of anticoagulants: Secondary | ICD-10-CM | POA: Diagnosis not present

## 2023-08-22 DIAGNOSIS — I10 Essential (primary) hypertension: Secondary | ICD-10-CM | POA: Insufficient documentation

## 2023-08-22 DIAGNOSIS — J9811 Atelectasis: Secondary | ICD-10-CM | POA: Diagnosis not present

## 2023-08-22 DIAGNOSIS — I2510 Atherosclerotic heart disease of native coronary artery without angina pectoris: Secondary | ICD-10-CM | POA: Diagnosis not present

## 2023-08-22 LAB — RESP PANEL BY RT-PCR (RSV, FLU A&B, COVID)  RVPGX2
Influenza A by PCR: NEGATIVE
Influenza B by PCR: NEGATIVE
Resp Syncytial Virus by PCR: NEGATIVE
SARS Coronavirus 2 by RT PCR: NEGATIVE

## 2023-08-22 MED ORDER — GUAIFENESIN-DM 100-10 MG/5ML PO SYRP: 5.0000 mL | ORAL_SOLUTION | ORAL | 0 refills | Status: AC | PRN

## 2023-08-22 MED ORDER — GUAIFENESIN-DM 100-10 MG/5ML PO SYRP
5.0000 mL | ORAL_SOLUTION | ORAL | Status: DC | PRN
  Administered 2023-08-22: 5 mL via ORAL
  Filled 2023-08-22: qty 5

## 2023-08-22 NOTE — ED Triage Notes (Signed)
 Pt presents pov from home Persistent cough and congestion with sneezing x 1 week. Has had trouble speaking d/t "spasm" in his throat.  Had covid vaccine 3/8.  Flu shot up to date as of November.

## 2023-08-22 NOTE — Discharge Instructions (Addendum)
 Thank you for let us  evaluate you today.  You are negative for COVID, flu, RSV.  Your chest x-ray is negative for infection.  I have sent Robitussin DM to your pharmacy to use as needed for cough.  Please follow-up with PCP if symptoms do not improve within the next 3 to 5 days.  Follow-up with GI about your esophageal spasm following being sick.  Return to Emergency Department if you experience chest pain, shortness of breath

## 2023-08-22 NOTE — ED Provider Notes (Signed)
 [facility] [place] Provider Note   CSN: [id number] Arrival date & time: 08/22/23  1533     History  Chief Complaint  Patient presents with   Cough    Brent Griffith is a 88 y.o. male with past medical history of HLD, HTN, GERD, OSA, CABG, BPH, IDA, CAD, HFrEF presents emergency department for evaluation of cough, congestion for the past week.  He reports that he has esophageal spasms when he coughs.  He has had these spasms for 3-4 years and they typically get worse when he is sick or coughs.  He follows [facility] for this.  He denies CP, SHOB, NVD, fevers   Cough     Home Medications Prior to Admission medications   Medication Sig Start Date End Date Taking? Authorizing Provider  guaiFENesin -dextromethorphan  (ROBITUSSIN DM) 100-10 MG/5ML syrup Take 5 mLs by mouth every 4 (four) hours as needed for up to 7 days for cough. 08/22/23 08/29/23 Yes Royann Cords, PA  albuterol  (VENTOLIN  HFA) 108 (90 Base) MCG/ACT inhaler Inhale 1-2 puffs into the lungs every 6 (six) hours as needed for wheezing or shortness of breath. 07/03/22   Fargo, Amy E, NP  alum & mag hydroxide-simeth (MAALOX MAX) 400-400-40 MG/5ML suspension Take 10 mLs by mouth every 6 (six) hours as needed (pain with swallowing and reflux). 10/21/22   Arnie Bibber, MD  amiodarone  (PACERONE ) 200 MG tablet Take 1 tablet (200 mg total) by mouth daily. 08/07/23   Lenise Quince, MD  Ascorbic Acid (VITAMIN C) 1000 MG tablet Take 1,000 mg by mouth daily.    [provider]  Calcium  Citrate-Vitamin D (EQ CALCIUM  CITRATE+D3 PO) Take 1,200 mg by mouth daily.    [provider]  carvedilol  (COREG ) 3.125 MG tablet TAKE 1 TABLET BY MOUTH TWICE  DAILY 01/21/23   Lenise Quince, MD  Cholecalciferol  (VITAMIN D-3) 5000 units TABS Take 10,000 Units by mouth daily.    [provider]  Coenzyme Q10 (COQ10) 200 MG CAPS Take 200 mg by mouth daily.    [provider]   doxazosin  (CARDURA ) 2 MG tablet TAKE 1 TABLET BY MOUTH DAILY 03/11/23   Lenise Quince, MD  empagliflozin  (JARDIANCE ) 10 MG TABS tablet Take 1 tablet (10 mg total) by mouth daily before breakfast. 06/05/23   Lenise Quince, MD  famotidine  (PEPCID ) 20 MG tablet Take 1 tablet (20 mg total) by mouth 2 (two) times daily. 02/05/23 05/07/48  McMichael, Elba Greathouse, PA-C  fexofenadine (ALLEGRA) 180 MG tablet Take 180 mg by mouth daily as needed for allergies.    [provider]  finasteride  (PROSCAR ) 5 MG tablet TAKE 1 TABLET BY MOUTH IN  THE MORNING 10/06/21   Eubanks, Jessica K, NP  furosemide  (LASIX ) 20 MG tablet TAKE 1 TABLET BY MOUTH DAILY AS  NEEDED FOR LEG SWELLING 01/21/23   Lenise Quince, MD  Iron, Ferrous Sulfate, 325 (65 Fe) MG TABS Take 325 mg by mouth 2 (two) times daily. 09/13/21   Verma Gobble, NP  lactose free nutrition (BOOST) LIQD Take 237 mLs by mouth every Monday, Wednesday, and Friday.    [provider]  losartan  (COZAAR ) 25 MG tablet TAKE 1 TABLET BY MOUTH DAILY  PLEASE KEEP SCHEDULED  APPOINTMENT WITH CARDIOLOGIST 04/23/23   Lenise Quince, MD  mexiletine (MEXITIL) 150 MG capsule Take 1 capsule (150 mg total) by mouth 2 (two) times daily. 04/16/23   Lenise Quince, MD  pantoprazole  (PROTONIX ) 40 MG tablet TAKE 1 TABLET BY MOUTH DAILY 03/01/23   Eubanks, Jessica K, NP  rosuvastatin  (CRESTOR ) 20 MG tablet TAKE 1 TABLET BY MOUTH ONCE  DAILY FOR CHOLESTEROL 07/29/23   Eubanks, Jessica K, NP  UNABLE TO FIND Take 1 tablet by mouth daily. Med Name: Inner ear health plus Patient not taking: Reported on 05/27/2023    [provider]  UNABLE TO FIND Take 2 tablets by mouth daily. Med Name: Focus Factor    [provider]  warfarin (COUMADIN ) 2.5 MG tablet TAKE 1/2 TO 1 TABLET BY MOUTH  DAILY AS DIRECTED BY THE  COUMADIN  CLINIC 04/15/23   Lenise Quince, MD      Allergies    Compazine [prochlorperazine edisylate]    Review of Systems    Review of Systems  Respiratory:  Positive for cough.     Physical Exam Updated Vital Signs BP (!) 168/71 (BP Location: Right Arm)   Pulse (!) 58   Temp 98.8 F (37.1 C) (Oral)   Resp 17   SpO2 98%  Physical Exam Vitals and nursing note reviewed.  Constitutional:      General: He is not in acute distress.    Appearance: Normal appearance.  HENT:     Head: Normocephalic and atraumatic.     Comments: Maintaining oral secretions wo difficulty. No submandibular swelling nor tenderness. No pain nor difficulty swallowing    Right Ear: Hearing, tympanic membrane, ear canal and external ear normal.     Left Ear: Hearing, tympanic membrane, ear canal and external ear normal.     Nose: Congestion present.     Mouth/Throat:     Mouth: Mucous membranes are moist. No oral lesions.     Pharynx: Uvula midline. No oropharyngeal exudate, posterior oropharyngeal erythema or uvula swelling.     Tonsils: 0 on the right. 0 on the left.  Eyes:     Conjunctiva/sclera: Conjunctivae normal.  Neck:     Comments: No meningismus Cardiovascular:     Rate and Rhythm: Normal rate.     Pulses: Normal pulses.  Pulmonary:     Effort: Pulmonary effort is normal. No respiratory distress.     Breath sounds: Normal breath sounds.  Chest:     Chest wall: No tenderness.  Abdominal:     General: Bowel sounds are normal. There is no distension.     Palpations: Abdomen is soft.     Tenderness: There is no abdominal tenderness. There is no guarding.  Musculoskeletal:     Cervical back: Normal range of motion and neck supple. No rigidity.     Right lower leg: No edema.     Left lower leg: No edema.  Skin:    General: Skin is warm.     Capillary Refill: Capillary refill takes less than 2 seconds.     Coloration: Skin is not jaundiced or pale.  Neurological:     Mental Status: He is alert and oriented to person, place, and time. Mental status is at baseline.     ED Results / Procedures / Treatments    Labs (all labs ordered are listed, but only abnormal results are displayed) Labs Reviewed  RESP PANEL BY RT-PCR (RSV, FLU A&B, COVID)  [id number]    EKG None  Radiology DG Chest 2 View Result Date: 08/22/2023 CLINICAL DATA:  Shortness of breath, cough EXAM: CHEST - 2 VIEW COMPARISON:  10/21/2022 FINDINGS: Prior CABG. Heart and mediastinal contours within normal limits. Linear subsegmental  atelectasis in the lung bases. No confluent opacities or effusions. There is hyperinflation of the lungs compatible with COPD. IMPRESSION: Mild hyperinflation/COPD. Bibasilar atelectasis. No active disease. Electronically Signed   By: Janeece Mechanic M.D.   On: 08/22/2023 18:27    Procedures Procedures    Medications Ordered in ED Medications  guaiFENesin -dextromethorphan  (ROBITUSSIN DM) 100-10 MG/5ML syrup 5 mL (5 mLs Oral Given 08/22/23 2038)    ED Course/ Medical Decision Making/ A&P                                 Medical Decision Making Amount and/or Complexity of Data Reviewed Radiology: ordered.  Risk OTC drugs.   Patient presents to the ED for concern of cough congestion rhinorrhea, esophageal spasm, this involves an extensive number of treatment options, and is a complaint that carries with it a high risk of complications and morbidity.  The differential diagnosis includes covid, flu, rsv, PTA, retropharyngeal abscess, ludwigs angina, chronic esophageal spasm, stricture, gerd   Co morbidities that complicate the patient evaluation  See  HPI   Additional history obtained:  Additional history obtained from Nursing and Outside Medical Records   External records from outside source obtained and reviewed including triage RN note,    Lab Tests:  I Ordered, and personally interpreted labs.  The pertinent results include:   Resp panel neg    Medicines ordered and prescription drug management:  I ordered medication including Robitussin DM  for cough  Reevaluation of the patient  after these medicines showed that the patient improved I have reviewed the patients home medicines and have made adjustments as needed    Problem List / ED Course:  Viral URI with cough No alarm signs found on PE Cxr negative for PNA Low suspicion for bacterial etiology Esophageal spasm Chronic issue persisting for 3-16yrs and followed by [facility] Reports worsened with cough Maintaining secretions and oxygenation without difficulty. No resp distress Will provide robitussin DM for cough to reduce esophageal spasms - spoke to pharmacy via phone regarding Robitussin DM and coumadin . No interactions requiring INR follow up are noted will have patient follow up with GI for further management   Reevaluation:  After the interventions noted above, I reevaluated the patient and found that they have :stayed the same    Dispostion:  After consideration of the diagnostic results and the patients response to treatment, I feel that the patent would benefit from outpatient management with symptomatic care and GI f/u.   Discussed ED workup, disposition, return to ED precautions with patient who expresses understanding agrees with plan.  All questions answered to their satisfaction.  They are agreeable to plan.  Discharge instructions provided on paperwork  Final Clinical Impression(s) / ED Diagnoses Final diagnoses:  Viral URI with cough  Esophageal spasm    Rx / DC Orders ED Discharge Orders          Ordered    guaiFENesin -dextromethorphan  (ROBITUSSIN DM) 100-10 MG/5ML syrup  Every 4 hours PRN        08/22/23 2033              Royann Cords, PA 08/22/23 2238    Almond Army, MD 08/23/23 [phone]

## 2023-08-22 NOTE — ED Notes (Signed)
 Pt alert and oriented X 4 at the time of discharge. RR even and unlabored. No acute distress noted. Pt verbalized understanding of discharge instructions as discussed. Pt ambulatory to lobby with walker  at time of discharge.

## 2023-08-23 ENCOUNTER — Encounter (HOSPITAL_BASED_OUTPATIENT_CLINIC_OR_DEPARTMENT_OTHER): Payer: Self-pay | Admitting: Emergency Medicine

## 2023-08-23 ENCOUNTER — Other Ambulatory Visit: Payer: Self-pay

## 2023-08-23 ENCOUNTER — Emergency Department (HOSPITAL_BASED_OUTPATIENT_CLINIC_OR_DEPARTMENT_OTHER)

## 2023-08-23 ENCOUNTER — Emergency Department (HOSPITAL_BASED_OUTPATIENT_CLINIC_OR_DEPARTMENT_OTHER)
Admission: EM | Admit: 2023-08-23 | Discharge: 2023-08-23 | Disposition: A | Discharge status: Home / Self Care | Attending: Emergency Medicine | Admitting: Emergency Medicine

## 2023-08-23 DIAGNOSIS — S0083XA Contusion of other part of head, initial encounter: Secondary | ICD-10-CM | POA: Diagnosis not present

## 2023-08-23 DIAGNOSIS — W19XXXA Unspecified fall, initial encounter: Secondary | ICD-10-CM

## 2023-08-23 DIAGNOSIS — R059 Cough, unspecified: Secondary | ICD-10-CM | POA: Insufficient documentation

## 2023-08-23 DIAGNOSIS — W01190A Fall on same level from slipping, tripping and stumbling with subsequent striking against furniture, initial encounter: Secondary | ICD-10-CM | POA: Insufficient documentation

## 2023-08-23 DIAGNOSIS — M40204 Unspecified kyphosis, thoracic region: Secondary | ICD-10-CM | POA: Diagnosis not present

## 2023-08-23 DIAGNOSIS — Z7901 Long term (current) use of anticoagulants: Secondary | ICD-10-CM | POA: Insufficient documentation

## 2023-08-23 DIAGNOSIS — S0240FA Zygomatic fracture, left side, initial encounter for closed fracture: Secondary | ICD-10-CM | POA: Diagnosis not present

## 2023-08-23 DIAGNOSIS — S0003XA Contusion of scalp, initial encounter: Secondary | ICD-10-CM | POA: Diagnosis not present

## 2023-08-23 DIAGNOSIS — M4854XA Collapsed vertebra, not elsewhere classified, thoracic region, initial encounter for fracture: Secondary | ICD-10-CM | POA: Diagnosis not present

## 2023-08-23 DIAGNOSIS — S0993XA Unspecified injury of face, initial encounter: Secondary | ICD-10-CM | POA: Diagnosis present

## 2023-08-23 LAB — PROTIME-INR
INR: 2.2 — ABNORMAL HIGH (ref 0.8–1.2)
Prothrombin Time: 24.6 s — ABNORMAL HIGH (ref 11.4–15.2)

## 2023-08-23 LAB — CBC WITH DIFFERENTIAL/PLATELET
Abs Immature Granulocytes: 0.04 10*3/uL (ref 0.00–0.07)
Basophils Absolute: 0 10*3/uL (ref 0.0–0.1)
Basophils Relative: 0 %
Eosinophils Absolute: 0 10*3/uL (ref 0.0–0.5)
Eosinophils Relative: 1 %
HCT: 38.8 % — ABNORMAL LOW (ref 39.0–52.0)
Hemoglobin: 13.3 g/dL (ref 13.0–17.0)
Immature Granulocytes: 1 %
Lymphocytes Relative: 7 %
Lymphs Abs: 0.5 10*3/uL — ABNORMAL LOW (ref 0.7–4.0)
MCH: 31.1 pg (ref 26.0–34.0)
MCHC: 34.3 g/dL (ref 30.0–36.0)
MCV: 90.7 fL (ref 80.0–100.0)
Monocytes Absolute: 0.8 10*3/uL (ref 0.1–1.0)
Monocytes Relative: 11 %
Neutro Abs: 5.6 10*3/uL (ref 1.7–7.7)
Neutrophils Relative %: 80 %
Platelets: 156 10*3/uL (ref 150–400)
RBC: 4.28 MIL/uL (ref 4.22–5.81)
RDW: 12.1 % (ref 11.5–15.5)
WBC: 6.9 10*3/uL (ref 4.0–10.5)
nRBC: 0 % (ref 0.0–0.2)

## 2023-08-23 LAB — BASIC METABOLIC PANEL WITH GFR
Anion gap: 10 (ref 5–15)
BUN: 26 mg/dL — ABNORMAL HIGH (ref 8–23)
CO2: 21 mmol/L — ABNORMAL LOW (ref 22–32)
Calcium: 8.6 mg/dL — ABNORMAL LOW (ref 8.9–10.3)
Chloride: 103 mmol/L (ref 98–111)
Creatinine, Ser: 1.24 mg/dL (ref 0.61–1.24)
GFR, Estimated: 57 mL/min — ABNORMAL LOW (ref >60)
Glucose, Bld: 103 mg/dL — ABNORMAL HIGH (ref 70–99)
Potassium: 4.2 mmol/L (ref 3.5–5.1)
Sodium: 134 mmol/L — ABNORMAL LOW (ref 135–145)

## 2023-08-23 MED ORDER — BENZONATATE 100 MG PO CAPS
100.0000 mg | ORAL_CAPSULE | Freq: Three times a day (TID) | ORAL | 0 refills | Status: DC | PRN
Start: 2023-08-23 — End: 2023-09-16

## 2023-08-23 MED ORDER — FLUTICASONE PROPIONATE 50 MCG/ACT NA SUSP: 1.0000 | Freq: Every day | NASAL | 1 refills | Status: DC

## 2023-08-23 MED ORDER — BENZONATATE 100 MG PO CAPS
100.0000 mg | ORAL_CAPSULE | Freq: Once | ORAL | Status: AC
  Administered 2023-08-23: 100 mg via ORAL
  Filled 2023-08-23: qty 1

## 2023-08-23 NOTE — Discharge Instructions (Addendum)
 Your INR was 2.2 today.  Please follow-up with your warfarin clinic for further adjustments.

## 2023-08-23 NOTE — ED Triage Notes (Signed)
 Patient arrives POV after mechanical fall tonight and hit head on dresser. Hematoma to right forehead. No LOC. On coumadin . GCS 15.

## 2023-08-23 NOTE — ED Provider Notes (Signed)
 [place] [facility] Provider Note   CSN: [id number] Arrival date & time: 08/23/23  5621     History  Chief Complaint  Patient presents with   Brent Griffith is a 88 y.o. male.  The history is provided by the patient, the spouse and medical records.  Fall  Brent Griffith is a 88 y.o. male who presents to the Emergency Department complaining of fall.  He presents to the emergency department for evaluation of injuries following a fall.  He states that he was sitting on the edge of the bed watching the dust bowl when he slipped and fell, striking his head.  He was able to get himself back up.  No loss of consciousness.  He does take warfarin for history of atrial fibrillation.  He was evaluated in the emergency department earlier today for 1 week of mild cough, postnasal drip and throat irritation.  He did recently have his warfarin dose increased due to low level.  No fever, chest pain, difficulty breathing, numbness, weakness, nausea, vomiting. He has chronic neck pain, at his baseline.    Home Medications Prior to Admission medications   Medication Sig Start Date End Date Taking? Authorizing Provider  benzonatate  (TESSALON ) 100 MG capsule Take 1 capsule (100 mg total) by mouth 3 (three) times daily as needed. 08/23/23  Yes Kelsey Patricia, MD  fluticasone  (FLONASE ) 50 MCG/ACT nasal spray Place 1 spray into both nostrils daily. 08/23/23  Yes Kelsey Patricia, MD  albuterol  (VENTOLIN  HFA) 108 [phone] Base) MCG/ACT inhaler Inhale 1-2 puffs into the lungs every 6 (six) hours as needed for wheezing or shortness of breath. 07/03/22   Fargo, Amy E, NP  alum & mag hydroxide-simeth (MAALOX MAX) 400-400-40 MG/5ML suspension Take 10 mLs by mouth every 6 (six) hours as needed (pain with swallowing and reflux). 10/21/22   Arnie Bibber, MD  amiodarone  (PACERONE ) 200 MG tablet Take 1 tablet (200 mg total) by mouth daily. 08/07/23   Lenise Quince, MD   Ascorbic Acid (VITAMIN C) 1000 MG tablet Take 1,000 mg by mouth daily.    [provider]  Calcium  Citrate-Vitamin D (EQ CALCIUM  CITRATE+D3 PO) Take 1,200 mg by mouth daily.    [provider]  carvedilol  (COREG ) 3.125 MG tablet TAKE 1 TABLET BY MOUTH TWICE  DAILY 01/21/23   Lenise Quince, MD  Cholecalciferol  (VITAMIN D-3) 5000 units TABS Take 10,000 Units by mouth daily.    [provider]  Coenzyme Q10 (COQ10) 200 MG CAPS Take 200 mg by mouth daily.    [provider]  doxazosin  (CARDURA ) 2 MG tablet TAKE 1 TABLET BY MOUTH DAILY 03/11/23   Lenise Quince, MD  empagliflozin  (JARDIANCE ) 10 MG TABS tablet Take 1 tablet (10 mg total) by mouth daily before breakfast. 06/05/23   Lenise Quince, MD  famotidine  (PEPCID ) 20 MG tablet Take 1 tablet (20 mg total) by mouth 2 (two) times daily. 02/05/23 05/07/48  McMichael, Elba Greathouse, PA-C  fexofenadine (ALLEGRA) 180 MG tablet Take 180 mg by mouth daily as needed for allergies.    [provider]  finasteride  (PROSCAR ) 5 MG tablet TAKE 1 TABLET BY MOUTH IN  THE MORNING 10/06/21   Eubanks, Jessica K, NP  furosemide  (LASIX ) 20 MG tablet TAKE 1 TABLET BY MOUTH DAILY AS  NEEDED FOR LEG SWELLING 01/21/23   Lenise Quince, MD  guaiFENesin -dextromethorphan  (ROBITUSSIN DM) 100-10 MG/5ML syrup Take 5 mLs by  mouth every 4 (four) hours as needed for up to 7 days for cough. 08/22/23 08/29/23  Royann Cords, PA  Iron, Ferrous Sulfate, 325 (65 Fe) MG TABS Take 325 mg by mouth 2 (two) times daily. 09/13/21   Verma Gobble, NP  lactose free nutrition (BOOST) LIQD Take 237 mLs by mouth every Monday, Wednesday, and Friday.    [provider]  losartan  (COZAAR ) 25 MG tablet TAKE 1 TABLET BY MOUTH DAILY  PLEASE KEEP SCHEDULED  APPOINTMENT WITH CARDIOLOGIST 04/23/23   Lenise Quince, MD  mexiletine (MEXITIL) 150 MG capsule Take 1 capsule (150 mg total) by mouth 2 (two) times daily. 04/16/23   Lenise Quince, MD   pantoprazole  (PROTONIX ) 40 MG tablet TAKE 1 TABLET BY MOUTH DAILY 03/01/23   Eubanks, Jessica K, NP  rosuvastatin  (CRESTOR ) 20 MG tablet TAKE 1 TABLET BY MOUTH ONCE  DAILY FOR CHOLESTEROL 07/29/23   Eubanks, Jessica K, NP  UNABLE TO FIND Take 1 tablet by mouth daily. Med Name: Inner ear health plus Patient not taking: Reported on 05/27/2023    [provider]  UNABLE TO FIND Take 2 tablets by mouth daily. Med Name: Focus Factor    [provider]  warfarin (COUMADIN ) 2.5 MG tablet TAKE 1/2 TO 1 TABLET BY MOUTH  DAILY AS DIRECTED BY THE  COUMADIN  CLINIC 04/15/23   Lenise Quince, MD      Allergies    Compazine [prochlorperazine edisylate]    Review of Systems   Review of Systems  All other systems reviewed and are negative.   Physical Exam Updated Vital Signs BP (!) 142/73   Pulse 68   Temp 98.7 F (37.1 C) (Oral)   Resp 20   Ht 5\' 2"  (1.575 m)   Wt 54.4 kg   SpO2 96%   BMI 21.94 kg/m  Physical Exam Vitals and nursing note reviewed.  Constitutional:      Appearance: He is well-developed.  HENT:     Head: Normocephalic.     Comments: Soft tissue swelling and ecchymosis to the left temple    Mouth/Throat:     Mouth: Mucous membranes are moist.     Pharynx: No oropharyngeal exudate or posterior oropharyngeal erythema.  Neck:     Comments: Severe kyphoscoliosis.  There is no midline cervical spine tenderness Cardiovascular:     Rate and Rhythm: Normal rate and regular rhythm.     Heart sounds: No murmur heard. Pulmonary:     Effort: Pulmonary effort is normal. No respiratory distress.     Breath sounds: Normal breath sounds.  Abdominal:     Palpations: Abdomen is soft.     Tenderness: There is no abdominal tenderness. There is no guarding or rebound.  Musculoskeletal:        General: No tenderness.  Skin:    General: Skin is warm and dry.  Neurological:     Mental Status: He is alert and oriented to person, place, and time.     Comments: 5 out of  5 strength in all 4 extremities  Psychiatric:        Behavior: Behavior normal.     ED Results / Procedures / Treatments   Labs (all labs ordered are listed, but only abnormal results are displayed) Labs Reviewed  BASIC METABOLIC PANEL WITH GFR - Abnormal; Notable for the following components:      Result Value   Sodium 134 (*)    CO2 21 (*)    Glucose,  Bld 103 (*)    BUN 26 (*)    Calcium  8.6 (*)    GFR, Estimated 57 (*)    All other components within normal limits  CBC WITH DIFFERENTIAL/PLATELET - Abnormal; Notable for the following components:   HCT 38.8 (*)    Lymphs Abs 0.5 (*)    All other components within normal limits  PROTIME-INR - Abnormal; Notable for the following components:   Prothrombin Time 24.6 (*)    INR 2.2 (*)    All other components within normal limits    EKG None  Radiology CT Cervical Spine Wo Contrast Result Date: 08/23/2023 CLINICAL DATA:  88 year old male status post fall striking head on furniture. On Coumadin . EXAM: CT CERVICAL SPINE WITHOUT CONTRAST TECHNIQUE: Multidetector CT imaging of the cervical spine was performed without intravenous contrast. Multiplanar CT image reconstructions were also generated. RADIATION DOSE REDUCTION: This exam was performed according to the departmental dose-optimization program which includes automated exposure control, adjustment of the mA and/or kV according to patient size and/or use of iterative reconstruction technique. COMPARISON:  Head CT today.  Cervical spine CT 05/15/2023. FINDINGS: Alignment: Exaggerated cervical lordosis superimposed on chronic upper thoracic kyphotic deformity is stable. Cervicothoracic junction alignment is within normal limits. Bilateral posterior element alignment is within normal limits. Skull base and vertebrae: Osteopenia. Visualized skull base is intact. No atlanto-occipital dissociation. C1 and C2 appear intact and aligned. No acute osseous abnormality identified. Soft tissues and  spinal canal: No prevertebral fluid or swelling. No visible canal hematoma. Negative visible noncontrast neck soft tissues. Disc levels:  Mild for age cervical spine degeneration. Upper chest: Chronic T2 and T3 compression fractures with kyphosis and ankylosis. Generalized osteopenia. Clear lung apices. Other: Carious dentition.  Chronic left zygomatic arch fracture. IMPRESSION: 1. No acute traumatic injury identified in the cervical spine. 2. Chronic osteopenia, upper thoracic compression fractures with ankylosis and kyphosis. Electronically Signed   By: Marlise Simpers M.D.   On: 08/23/2023 04:47   CT Head Wo Contrast Result Date: 08/23/2023 CLINICAL DATA:  88 year old male status post fall striking head on furniture. On Coumadin . EXAM: CT HEAD WITHOUT CONTRAST TECHNIQUE: Contiguous axial images were obtained from the base of the skull through the vertex without intravenous contrast. RADIATION DOSE REDUCTION: This exam was performed according to the departmental dose-optimization program which includes automated exposure control, adjustment of the mA and/or kV according to patient size and/or use of iterative reconstruction technique. COMPARISON:  Head CT 05/15/2023. FINDINGS: Brain: Cerebral volume is stable and within normal limits for age. No midline shift, ventriculomegaly, mass effect, evidence of mass lesion, intracranial hemorrhage or evidence of cortically based acute infarction. Stable gray-white differentiation including chronic lacunar infarct of the left caudate, basal ganglia. Vascular: No suspicious intracranial vascular hyperdensity. Calcified atherosclerosis at the skull base. Skull: Chronic left side Matic arch fracture. Stable visualized osseous structures. Stable thinning of the left calvarium convexity on series 303, image 39. Sinuses/Orbits: Bubbly opacity now in the maxillary and ethmoid sinuses. But no layering sinus fluid or hemorrhage. Tympanic cavities and mastoids remain well aerated. Other:  Left anterior convexity scalp hematoma measuring up to 9 mm in thickness. Underlying left frontal bone and left frontal sinus appears stable and intact. Other scalp soft tissues are stable. Stable orbits soft tissues. IMPRESSION: 1. Left anterior convexity scalp hematoma. No underlying acute skull fracture. 2. No acute intracranial abnormality. Stable small vessel disease in the left basal ganglia. 3. Mild new paranasal sinus inflammation. Electronically Signed   By: Marvina Slough  Del Favia M.D.   On: 08/23/2023 04:44   DG Chest 2 View Result Date: 08/22/2023 CLINICAL DATA:  Shortness of breath, cough EXAM: CHEST - 2 VIEW COMPARISON:  10/21/2022 FINDINGS: Prior CABG. Heart and mediastinal contours within normal limits. Linear subsegmental atelectasis in the lung bases. No confluent opacities or effusions. There is hyperinflation of the lungs compatible with COPD. IMPRESSION: Mild hyperinflation/COPD. Bibasilar atelectasis. No active disease. Electronically Signed   By: Janeece Mechanic M.D.   On: 08/22/2023 18:27    Procedures Procedures    Medications Ordered in ED Medications  benzonatate  (TESSALON ) capsule 100 mg (100 mg Oral Given 08/23/23 0256)    ED Course/ Medical Decision Making/ A&P                                 Medical Decision Making Amount and/or Complexity of Data Reviewed Labs: ordered. Radiology: ordered.  Risk Prescription drug management.   Patient here for evaluation of injuries following a mechanical fall.  He does have soft tissue swelling to his forehead, he is on anticoagulation.  CT head and cervical spine without acute abnormality.  INR is therapeutic at 2.2.  Labs are near his baseline.  Discussed with patient finding of sinusitis.  Will start on Flonase  as he does have seasonal allergies that may be contributing to this.  Discussed outpatient follow-up regarding head contusion as well as sinusitis.  No indication for antibiotics at this time.  Return precautions  discussed.        Final Clinical Impression(s) / ED Diagnoses Final diagnoses:  Fall, initial encounter  Contusion of other part of head, initial encounter    Rx / DC Orders ED Discharge Orders          Ordered    fluticasone  (FLONASE ) 50 MCG/ACT nasal spray  Daily        08/23/23 0456    benzonatate  (TESSALON ) 100 MG capsule  3 times daily PRN        08/23/23 0456              Kelsey Patricia, MD 08/23/23 [phone]

## 2023-08-26 ENCOUNTER — Ambulatory Visit

## 2023-09-02 NOTE — Progress Notes (Signed)
 HPI: FU coronary artery disease and atrial fibrillation. Cardiac catheterization on March 04, 2009 revealed severe three-vessel coronary disease as well as an 80% left main. His ejection fraction was 50%. He ultimately underwent coronary artery bypass and graft on November 1 (left internal mammary artery to left anterior descending, sequential saphenous vein graft to ramus intermedius and obtuse marginal 2, saphenous vein graft to posterior descending. Nuclear study April 2018 showed ejection fraction 48%. There was prior inferolateral infarct and mild apical ischemia.  Echocardiogram repeated September 2022 and showed ejection fraction 40 to 45%, mild mitral regurgitation, mild aortic insufficiency.  Monitor November 2022 showed sinus rhythm with occasional PAC, 5 beats PAT and occasional PVC.  Diagnosed with atrial fibrillation September 2022.  Started on amiodarone .  Also noted to have PVCs and mexiletine was added.  Since he was last seen, he has following again recently with head trauma.  He denies dyspnea, chest pain or syncope.  Current Outpatient Medications  Medication Sig Dispense Refill   albuterol  (VENTOLIN  HFA) 108 (90 Base) MCG/ACT inhaler Inhale 1-2 puffs into the lungs every 6 (six) hours as needed for wheezing or shortness of breath. 34 g 3   alum & mag hydroxide-simeth (MAALOX MAX) 400-400-40 MG/5ML suspension Take 10 mLs by mouth every 6 (six) hours as needed (pain with swallowing and reflux). 355 mL 0   amiodarone  (PACERONE ) 200 MG tablet Take 1 tablet (200 mg total) by mouth daily. 90 tablet 1   Ascorbic Acid (VITAMIN C) 1000 MG tablet Take 1,000 mg by mouth daily.     Calcium  Citrate-Vitamin D (EQ CALCIUM  CITRATE+D3 PO) Take 1,200 mg by mouth daily.     carvedilol  (COREG ) 3.125 MG tablet TAKE 1 TABLET BY MOUTH TWICE  DAILY 200 tablet 2   Cholecalciferol  (VITAMIN D-3) 5000 units TABS Take 10,000 Units by mouth daily.     Coenzyme Q10 (COQ10) 200 MG CAPS Take 200 mg by mouth  daily.     doxazosin  (CARDURA ) 2 MG tablet TAKE 1 TABLET BY MOUTH DAILY 90 tablet 3   empagliflozin  (JARDIANCE ) 10 MG TABS tablet Take 1 tablet (10 mg total) by mouth daily before breakfast. 90 tablet 2   famotidine  (PEPCID ) 20 MG tablet Take 1 tablet (20 mg total) by mouth 2 (two) times daily. 180 tablet 3   finasteride  (PROSCAR ) 5 MG tablet TAKE 1 TABLET BY MOUTH IN  THE MORNING 90 tablet 3   furosemide  (LASIX ) 20 MG tablet TAKE 1 TABLET BY MOUTH DAILY AS  NEEDED FOR LEG SWELLING 100 tablet 2   Iron, Ferrous Sulfate, 325 (65 Fe) MG TABS Take 325 mg by mouth 2 (two) times daily. 30 tablet    lactose free nutrition (BOOST) LIQD Take 237 mLs by mouth every Monday, Wednesday, and Friday.     losartan  (COZAAR ) 25 MG tablet TAKE 1 TABLET BY MOUTH DAILY  PLEASE KEEP SCHEDULED  APPOINTMENT WITH CARDIOLOGIST 100 tablet 2   mexiletine (MEXITIL) 150 MG capsule Take 1 capsule (150 mg total) by mouth 2 (two) times daily. 180 capsule 2   pantoprazole  (PROTONIX ) 40 MG tablet TAKE 1 TABLET BY MOUTH DAILY 100 tablet 2   rosuvastatin  (CRESTOR ) 20 MG tablet TAKE 1 TABLET BY MOUTH ONCE  DAILY FOR CHOLESTEROL 100 tablet 2   warfarin (COUMADIN ) 2.5 MG tablet TAKE 1/2 TO 1 TABLET BY MOUTH  DAILY AS DIRECTED BY THE  COUMADIN  CLINIC 80 tablet 2   benzonatate  (TESSALON ) 100 MG capsule Take 1 capsule (100 mg  total) by mouth 3 (three) times daily as needed. (Patient not taking: Reported on 09/12/2023) 21 capsule 0   fexofenadine (ALLEGRA) 180 MG tablet Take 180 mg by mouth daily as needed for allergies. (Patient not taking: Reported on 09/12/2023)     fluticasone  (FLONASE ) 50 MCG/ACT nasal spray Place 1 spray into both nostrils daily. (Patient not taking: Reported on 09/12/2023) 9.9 mL 1   UNABLE TO FIND Take 1 tablet by mouth daily. Med Name: Inner ear health plus (Patient not taking: Reported on 09/12/2023)     UNABLE TO FIND Take 2 tablets by mouth daily. Med Name: Focus Factor (Patient not taking: Reported on 09/12/2023)     No  current facility-administered medications for this visit.     Past Medical History:  Diagnosis Date   Acute gastric ulcer without mention of hemorrhage, perforation, or obstruction    Allergic rhinitis, cause unspecified    Anemia, unspecified    Arthritis    Asthma    Blood transfusion without reported diagnosis    CAD (coronary artery disease)    Diaphragmatic hernia without mention of obstruction or gangrene    Elevated prostate specific antigen (PSA)    Enlarged prostate    Esophageal reflux    Extrinsic asthma, unspecified    Herpes zoster without mention of complication    Hyperlipidemia    Hypersomnia with sleep apnea, unspecified    Impotence of organic origin    Kyphosis    Lumbago    Obstructive sleep apnea (adult) (pediatric)    Pneumonia    hx of years ago    Senile osteoporosis    Trigger finger (acquired)    Tubular adenoma of colon 05/2013   Unspecified essential hypertension    Unspecified sleep apnea    CPAP- settings 4-12    Unspecified vitamin D deficiency     Past Surgical History:  Procedure Laterality Date   CARDIAC CATHETERIZATION  03/04/2009   Dr Luna Salinas   COLONOSCOPY     CORONARY ARTERY BYPASS GRAFT  2010   CYSTOSCOPY WITH RETROGRADE PYELOGRAM, URETEROSCOPY AND STENT PLACEMENT Right 10/04/2013   Procedure: CYSTOSCOPY WITH RETROGRADE PYELOGRAM,  AND STENT PLACEMENT;  Surgeon: Christina Coyer, MD;  Location: [facility];  Service: Urology;  Laterality: Right;   DENTAL SURGERY N/A    MOLE REMOVAL  1958   chin   ROTATOR CUFF REPAIR Left 04/1998   with bone spur removed, Dr Marland Silvas   TONSILLECTOMY  1945   TRANSURETHRAL RESECTION OF PROSTATE N/A 09/24/2013   Procedure: TRANSURETHRAL RESECTION OF THE PROSTATE (TURP) WITH GYRUS (STAGED RIGHT LATERAL AND MEDIAN LOBE);  Surgeon: Edmund Gouge, MD;  Location: [facility];  Service: Urology;  Laterality: N/A;   UPPER GI ENDOSCOPY      Social History   Socioeconomic History   Marital status:  Married    Spouse name: Not on file   Number of children: 0   Years of education: Not on file   Highest education level: Not on file  Occupational History   Occupation: Architectural technologist Controls  Tobacco Use   Smoking status: Never    Passive exposure: Never   Smokeless tobacco: Never  Vaping Use   Vaping status: Never Used  Substance and Sexual Activity   Alcohol use: No   Drug use: No   Sexual activity: Not Currently  Other Topics Concern   Not on file  Social History Narrative   Not on file   Social Drivers of Health   Financial  Resource Strain: Low Risk  (07/09/2023)   Overall Financial Resource Strain (CARDIA)    Difficulty of Paying Living Expenses: Not very hard  Food Insecurity: No Food Insecurity (07/09/2023)   Hunger Vital Sign    Worried About Running Out of Food in the Last Year: Never true    Ran Out of Food in the Last Year: Never true  Recent Concern: Food Insecurity - Food Insecurity Present (05/22/2023)   Hunger Vital Sign    Worried About Running Out of Food in the Last Year: Sometimes true    Ran Out of Food in the Last Year: Sometimes true  Transportation Needs: No Transportation Needs (07/09/2023)   PRAPARE - Administrator, Civil Service (Medical): No    Lack of Transportation (Non-Medical): No  Physical Activity: Insufficiently Active (12/18/2022)   Exercise Vital Sign    Days of Exercise per Week: 2 days    Minutes of Exercise per Session: 30 min  Stress: Not on file  Social Connections: Not on file  Intimate Partner Violence: Not At Risk (06/26/2023)   Humiliation, Afraid, Rape, and Kick questionnaire    Fear of Current or Ex-Partner: No    Emotionally Abused: No    Physically Abused: No    Sexually Abused: No    Family History  Problem Relation Age of Onset   Stroke Mother    Breast cancer Mother    Cirrhosis Mother        wine   Rheumatic fever Mother    Kyphosis Mother    Hypertension Father    Stroke Father    Heart  disease Father    Heart disease Paternal Grandfather    Colon cancer Neg Hx     ROS: no fevers or chills, productive cough, hemoptysis, dysphasia, odynophagia, melena, hematochezia, dysuria, hematuria, rash, seizure activity, orthopnea, PND, pedal edema, claudication. Remaining systems are negative.  Physical Exam: Well-developed well-nourished in no acute distress.  Skin is warm and dry.  HEENT is normal.  Neck is supple.  Chest is clear to auscultation with normal expansion.  Cardiovascular exam is regular rate and rhythm.  Abdominal exam nontender or distended. No masses palpated. Extremities show no edema. neuro grossly intact  EKG Interpretation Date/Time:  Thursday Sep 12 2023 09:27:55 EDT Ventricular Rate:  58 PR Interval:  172 QRS Duration:  140 QT Interval:  452 QTC Calculation: 443 R Axis:   -34  Text Interpretation: Sinus bradycardia Left axis deviation Left bundle branch block Confirmed by Alexandria Angel ([id number]) on 09/12/2023 9:34:25 AM    A/P  1 paroxysmal atrial fibrillation-patient remains in sinus rhythm.  Continue amiodarone .  He has fallen again since last office visit.  I now feel that risk of anticoagulation outweighs benefit.  I discussed the potential for Watchman device today with patient.  He would be agreeable for further evaluation and we will arrange.  [facility] Referral for Left Atrial Appendage Closure with Non-Valvular Atrial Fibrillation   EMILIANO HOLLETT is a 88 y.o. male is being referred to the [facility] Team for evaluation for Left Atrial Appendage Closure with Watchman device for the management of stroke risk resulting form non-valvular atrial fibrillation.    Base upon Mr. Mcclenaghan history, he is felt to be a poor candidate for long-term anticoagulation because of a high risk of recurrent falls.  The patient has a HAS-BLED score of 3 indicating a Yearly Major Bleeding Risk of 3.74%.    His CHADS2-VASc Score is  5 with an  unadjusted Ischemic Stroke Rate (% per year) of 7.2%.  {   His stroke risk necessitates a strategy of stroke prevention with either long-term oral anticoagulation or left atrial appendage occlusion therapy. We have discussed their bleeding risk in the context of their comorbid medical problems, as well as the rationale for referral for evaluation of Watchman left atrial appendage occlusion therapy. While the patient is at high long-term bleeding risk, they may be appropriate for short-term anticoagulation. Based on this individual patient's stroke and bleeding risk, a shared decision has been made to refer the patient for consideration of Watchman left atrial appendage closure utilizing the [employer] shared decision tool.   2 chronic combined systolic/diastolic congestive heart failure-continue losartan , beta-blocker, Lasix  and Jardiance .  He is euvolemic on examination.  Will repeat echocardiogram.  3 coronary artery disease status post coronary bypass and graft-he denies chest pain.  Continue statin.   4 hyperlipidemia-continue statin.  5 hypertension-patient's blood pressure is controlled.  Continue present medications.  6 history of PVCs-treated with amiodarone  and mexiletine.  Alexandria Angel, MD

## 2023-09-12 ENCOUNTER — Ambulatory Visit (INDEPENDENT_AMBULATORY_CARE_PROVIDER_SITE_OTHER): Admitting: *Deleted

## 2023-09-12 ENCOUNTER — Encounter: Payer: Self-pay | Admitting: Cardiology

## 2023-09-12 ENCOUNTER — Ambulatory Visit: Payer: Medicare Other | Attending: Cardiology | Admitting: Cardiology

## 2023-09-12 VITALS — BP 114/54 | HR 58 | Ht 62.0 in | Wt 127.0 lb

## 2023-09-12 DIAGNOSIS — Z5181 Encounter for therapeutic drug level monitoring: Secondary | ICD-10-CM

## 2023-09-12 DIAGNOSIS — I10 Essential (primary) hypertension: Secondary | ICD-10-CM

## 2023-09-12 DIAGNOSIS — I4891 Unspecified atrial fibrillation: Secondary | ICD-10-CM

## 2023-09-12 DIAGNOSIS — I2510 Atherosclerotic heart disease of native coronary artery without angina pectoris: Secondary | ICD-10-CM | POA: Diagnosis not present

## 2023-09-12 DIAGNOSIS — I480 Paroxysmal atrial fibrillation: Secondary | ICD-10-CM

## 2023-09-12 DIAGNOSIS — I255 Ischemic cardiomyopathy: Secondary | ICD-10-CM

## 2023-09-12 LAB — POCT INR: INR: 2.3 (ref 2.0–3.0)

## 2023-09-12 NOTE — Patient Instructions (Signed)
 Testing/Procedures: ECHO Your physician has requested that you have an echocardiogram. Echocardiography is a painless test that uses sound waves to create images of your heart. It provides your doctor with information about the size and shape of your heart and how well your heart's chambers and valves are working. This procedure takes approximately one hour. There are no restrictions for this procedure. Please do NOT wear cologne, perfume, aftershave, or lotions (deodorant is allowed). Please arrive 15 minutes prior to your appointment time.  Please note: We ask at that you not bring children with you during ultrasound (echo/ vascular) testing. Due to room size and safety concerns, children are not allowed in the ultrasound rooms during exams. Our front office staff cannot provide observation of children in our lobby area while testing is being conducted. An adult accompanying a patient to their appointment will only be allowed in the ultrasound room at the discretion of the ultrasound technician under special circumstances. We apologize for any inconvenience.  Ambulatory referral to Electrophysiology for Watchman Device ([facility]O)  Follow-Up: At [facility], you and your health needs are our priority.  As part of our continuing mission to provide you with exceptional heart care, our providers are all part of one team.  This team includes your primary Cardiologist (physician) and Advanced Practice Providers or APPs (Physician Assistants and Nurse Practitioners) who all work together to provide you with the care you need, when you need it.  Your next appointment:   6 month(s)  Provider:   Alexandria Angel, MD

## 2023-09-12 NOTE — Patient Instructions (Signed)
 Description   Continue taking 1/2 tablet daily, EXCEPT 1 tablet on Mondays and Fridays.  Stay consistent with boost (Mon, Wed, and Fri)  Recheck INR in 4 weeks. Coumadin  Clinic [phone]

## 2023-09-13 ENCOUNTER — Telehealth: Payer: Self-pay | Admitting: Pharmacy Technician

## 2023-09-13 ENCOUNTER — Other Ambulatory Visit (HOSPITAL_COMMUNITY): Payer: Self-pay

## 2023-09-13 ENCOUNTER — Telehealth: Payer: Self-pay

## 2023-09-13 NOTE — Telephone Encounter (Signed)
 Patient's wife sent the following message in her MyChart:  Please send a fax to [facility] for a prescription for walker with a seat on it . The fax number is [phone]. This is for Brent Griffith . I'm send No this because [facility] phone is not working .

## 2023-09-13 NOTE — Telephone Encounter (Signed)
 Brent Griffith "[name]"  P Cv Div [facility] Triage (supporting Lenise Quince, MD)10 hours ago (11:28 PM)    This is for [employer] .     Brent Compton Urizar "[name]"  P Cv Div [facility] Triage (supporting Lenise Quince, MD)10 hours ago (11:18 PM)    Brent Griffith is there a patient assistance program for the mexiletine150 mg .     I did run a test claim for [name] and it came back it would be $37.10 for 30 days or $109.81 for 90 days at our pharmacy on his [address] [employer]

## 2023-09-16 ENCOUNTER — Ambulatory Visit (INDEPENDENT_AMBULATORY_CARE_PROVIDER_SITE_OTHER): Payer: Medicare Other | Admitting: Nurse Practitioner

## 2023-09-16 ENCOUNTER — Encounter: Payer: Self-pay | Admitting: Nurse Practitioner

## 2023-09-16 VITALS — BP 136/78 | HR 63 | Wt 127.8 lb

## 2023-09-16 DIAGNOSIS — G4733 Obstructive sleep apnea (adult) (pediatric): Secondary | ICD-10-CM | POA: Diagnosis not present

## 2023-09-16 DIAGNOSIS — I10 Essential (primary) hypertension: Secondary | ICD-10-CM

## 2023-09-16 DIAGNOSIS — E559 Vitamin D deficiency, unspecified: Secondary | ICD-10-CM | POA: Diagnosis not present

## 2023-09-16 DIAGNOSIS — J3089 Other allergic rhinitis: Secondary | ICD-10-CM | POA: Diagnosis not present

## 2023-09-16 DIAGNOSIS — R3912 Poor urinary stream: Secondary | ICD-10-CM

## 2023-09-16 DIAGNOSIS — K219 Gastro-esophageal reflux disease without esophagitis: Secondary | ICD-10-CM | POA: Diagnosis not present

## 2023-09-16 DIAGNOSIS — J302 Other seasonal allergic rhinitis: Secondary | ICD-10-CM

## 2023-09-16 DIAGNOSIS — I5020 Unspecified systolic (congestive) heart failure: Secondary | ICD-10-CM

## 2023-09-16 DIAGNOSIS — I480 Paroxysmal atrial fibrillation: Secondary | ICD-10-CM

## 2023-09-16 DIAGNOSIS — D508 Other iron deficiency anemias: Secondary | ICD-10-CM

## 2023-09-16 DIAGNOSIS — N401 Enlarged prostate with lower urinary tract symptoms: Secondary | ICD-10-CM

## 2023-09-16 DIAGNOSIS — N1831 Chronic kidney disease, stage 3a: Secondary | ICD-10-CM

## 2023-09-16 DIAGNOSIS — I2510 Atherosclerotic heart disease of native coronary artery without angina pectoris: Secondary | ICD-10-CM

## 2023-09-16 DIAGNOSIS — R296 Repeated falls: Secondary | ICD-10-CM

## 2023-09-16 NOTE — Patient Instructions (Addendum)
 Recommended to take calcium  600 mg twice daily with Vitamin D 2000 units daily   To take Loratadine or cetrizine (generic for Claritin or zyrtec) 10 mg by mouth daily for allergies.  To use flonase  1 spray into both nares twice daily  If you are still having issues with congestion and cough once allergies get better then call pulmonary for follow up.

## 2023-09-16 NOTE — Progress Notes (Signed)
 Careteam: Patient Care Team: Verma Gobble, NP as PCP - General (Geriatric Medicine) Lenise Quince, MD as PCP - Cardiology (Cardiology) Boyce Byes, MD as PCP - Electrophysiology (Cardiology) Shermon Divine, MD as Consulting Physician (Sports Medicine) Maris Sickle, MD as Referring Physician (Ophthalmology) Nathen Balder, Skeeter Dukes, RN as VBCI Care Management Little, Skeeter Dukes, RN  PLACE OF SERVICE:  [facility]  Advanced Directive information    Allergies  Allergen Reactions   Compazine [Prochlorperazine Edisylate] Anxiety    Chief Complaint  Patient presents with   Follow-up    6 month follow up discuss medication      HPI:  Discussed the use of AI scribe software for clinical note transcription with the patient, who gave verbal consent to proceed.  History of Present Illness Brent Griffith is an 88 year old male with atrial fibrillation and coronary artery disease who presents for a six-month follow-up.  He is currently on amiodarone  for atrial fibrillation, taken daily in the morning, and carvedilol  for atrial fibrillation and hypertension  He has not been consistent with vitamin D supplementation and has not been taking calcium  supplements despite having osteoporosis.  He experiences allergy symptoms, particularly when pollen levels are high, and has been using albuterol  for episodes of coughing and laryngeal closure. His cough and congestion have worsened since pollen levels increased, but congestion is currently clearing up. He has been using albuterol  more frequently due to these symptoms.   He has not experienced significant leg swelling recently but occasionally takes Lasix  as needed for swelling. He denies current leg swelling but reports occasional foot swelling.  He mentions having black and blue marks on his toes and a bruise on his forehead from a previous fall, though he does not recall the date of the fall. Wife reports over easter weekend ~2  weeks ago.   He is on CPAP therapy for sleep apnea and has a history of coronary artery disease. Recent blood work showed improved anemia and good cholesterol levels, but low calcium  levels. He has not been taking his calcium  supplements regularly.  He has been seeing a pulmonologist annually for sleep apnea   he reports a good appetite and stable weight after discontinuing a medication that was initially prescribed to increase his appetite. No additional issues with indigestion.  Wife reports he continues to get tripped up and has had another fall. Shuffles his feet a lot.     Review of Systems:  Review of Systems  Constitutional:  Negative for chills, fever and weight loss.  HENT:  Negative for tinnitus.   Respiratory:  Negative for cough, sputum production and shortness of breath.   Cardiovascular:  Negative for chest pain, palpitations and leg swelling.  Gastrointestinal:  Negative for abdominal pain, constipation, diarrhea and heartburn.  Genitourinary:  Negative for dysuria, frequency and urgency.  Musculoskeletal:  Negative for back pain, falls, joint pain and myalgias.  Skin: Negative.   Neurological:  Negative for dizziness and headaches.  Psychiatric/Behavioral:  Negative for depression and memory loss. The patient does not have insomnia.   ***  Past Medical History:  Diagnosis Date   Acute gastric ulcer without mention of hemorrhage, perforation, or obstruction    Allergic rhinitis, cause unspecified    Anemia, unspecified    Arthritis    Asthma    Blood transfusion without reported diagnosis    CAD (coronary artery disease)    Diaphragmatic hernia without mention of obstruction or gangrene  Elevated prostate specific antigen (PSA)    Enlarged prostate    Esophageal reflux    Extrinsic asthma, unspecified    Herpes zoster without mention of complication    Hyperlipidemia    Hypersomnia with sleep apnea, unspecified    Impotence of organic origin    Kyphosis     Lumbago    Obstructive sleep apnea (adult) (pediatric)    Pneumonia    hx of years ago    Senile osteoporosis    Trigger finger (acquired)    Tubular adenoma of colon 05/2013   Unspecified essential hypertension    Unspecified sleep apnea    CPAP- settings 4-12    Unspecified vitamin D deficiency    Past Surgical History:  Procedure Laterality Date   CARDIAC CATHETERIZATION  03/04/2009   Dr Luna Salinas   COLONOSCOPY     CORONARY ARTERY BYPASS GRAFT  2010   CYSTOSCOPY WITH RETROGRADE PYELOGRAM, URETEROSCOPY AND STENT PLACEMENT Right 10/04/2013   Procedure: CYSTOSCOPY WITH RETROGRADE PYELOGRAM,  AND STENT PLACEMENT;  Surgeon: Christina Coyer, MD;  Location: [facility];  Service: Urology;  Laterality: Right;   DENTAL SURGERY N/A    MOLE REMOVAL  1958   chin   ROTATOR CUFF REPAIR Left 04/1998   with bone spur removed, Dr Marland Silvas   TONSILLECTOMY  1945   TRANSURETHRAL RESECTION OF PROSTATE N/A 09/24/2013   Procedure: TRANSURETHRAL RESECTION OF THE PROSTATE (TURP) WITH GYRUS (STAGED RIGHT LATERAL AND MEDIAN LOBE);  Surgeon: Edmund Gouge, MD;  Location: [facility];  Service: Urology;  Laterality: N/A;   UPPER GI ENDOSCOPY     Social History:   reports that he has never smoked. He has never been exposed to tobacco smoke. He has never used smokeless tobacco. He reports that he does not drink alcohol and does not use drugs.  Family History  Problem Relation Age of Onset   Stroke Mother    Breast cancer Mother    Cirrhosis Mother        wine   Rheumatic fever Mother    Kyphosis Mother    Hypertension Father    Stroke Father    Heart disease Father    Heart disease Paternal Grandfather    Colon cancer Neg Hx     Medications: Patient's Medications  New Prescriptions   No medications on file  Previous Medications   ALBUTEROL  (VENTOLIN  HFA) 108 (90 BASE) MCG/ACT INHALER    Inhale 1-2 puffs into the lungs every 6 (six) hours as needed for wheezing or shortness of breath.    ALUM & MAG HYDROXIDE-SIMETH (MAALOX MAX) 400-400-40 MG/5ML SUSPENSION    Take 10 mLs by mouth every 6 (six) hours as needed (pain with swallowing and reflux).   AMIODARONE  (PACERONE ) 200 MG TABLET    Take 1 tablet (200 mg total) by mouth daily.   ASCORBIC ACID (VITAMIN C) 1000 MG TABLET    Take 1,000 mg by mouth daily.   BENZONATATE  (TESSALON ) 100 MG CAPSULE    Take 1 capsule (100 mg total) by mouth 3 (three) times daily as needed.   CALCIUM  CITRATE-VITAMIN D (EQ CALCIUM  CITRATE+D3 PO)    Take 1,200 mg by mouth daily.   CARVEDILOL  (COREG ) 3.125 MG TABLET    TAKE 1 TABLET BY MOUTH TWICE  DAILY   CHOLECALCIFEROL  (VITAMIN D-3) 5000 UNITS TABS    Take 10,000 Units by mouth daily.   COENZYME Q10 (COQ10) 200 MG CAPS    Take 200 mg by mouth daily.   DOXAZOSIN  (  CARDURA ) 2 MG TABLET    TAKE 1 TABLET BY MOUTH DAILY   EMPAGLIFLOZIN  (JARDIANCE ) 10 MG TABS TABLET    Take 1 tablet (10 mg total) by mouth daily before breakfast.   FAMOTIDINE  (PEPCID ) 20 MG TABLET    Take 1 tablet (20 mg total) by mouth 2 (two) times daily.   FEXOFENADINE (ALLEGRA) 180 MG TABLET    Take 180 mg by mouth daily as needed for allergies.   FINASTERIDE  (PROSCAR ) 5 MG TABLET    TAKE 1 TABLET BY MOUTH IN  THE MORNING   FLUTICASONE  (FLONASE ) 50 MCG/ACT NASAL SPRAY    Place 1 spray into both nostrils daily.   FUROSEMIDE  (LASIX ) 20 MG TABLET    TAKE 1 TABLET BY MOUTH DAILY AS  NEEDED FOR LEG SWELLING   IRON, FERROUS SULFATE, 325 (65 FE) MG TABS    Take 325 mg by mouth 2 (two) times daily.   LACTOSE FREE NUTRITION (BOOST) LIQD    Take 237 mLs by mouth every Monday, Wednesday, and Friday.   LOSARTAN  (COZAAR ) 25 MG TABLET    TAKE 1 TABLET BY MOUTH DAILY  PLEASE KEEP SCHEDULED  APPOINTMENT WITH CARDIOLOGIST   MEXILETINE (MEXITIL) 150 MG CAPSULE    Take 1 capsule (150 mg total) by mouth 2 (two) times daily.   PANTOPRAZOLE  (PROTONIX ) 40 MG TABLET    TAKE 1 TABLET BY MOUTH DAILY   ROSUVASTATIN  (CRESTOR ) 20 MG TABLET    TAKE 1 TABLET BY MOUTH ONCE   DAILY FOR CHOLESTEROL   UNABLE TO FIND    Take 1 tablet by mouth daily. Med Name: Inner ear health plus   UNABLE TO FIND    Take 2 tablets by mouth daily. Med Name: Focus Factor   WARFARIN (COUMADIN ) 2.5 MG TABLET    TAKE 1/2 TO 1 TABLET BY MOUTH  DAILY AS DIRECTED BY THE  COUMADIN  CLINIC  Modified Medications   No medications on file  Discontinued Medications   No medications on file    Physical Exam:  Vitals:   09/16/23 1421  BP: 136/78  Pulse: 63  SpO2: 95%  Weight: 127 lb 12.8 oz (58 kg)   Body mass index is 23.37 kg/m. Wt Readings from Last 3 Encounters:  09/16/23 127 lb 12.8 oz (58 kg)  09/12/23 127 lb (57.6 kg)  08/23/23 119 lb 14.9 oz (54.4 kg)    Physical Exam***  Labs reviewed: Basic Metabolic Panel: Recent Labs    10/22/22 1557 01/18/23 1450 08/23/23 0258  NA 139 141 134*  K 4.4 4.3 4.2  CL 104 106 103  CO2 28 25 21*  GLUCOSE 89 50* 103*  BUN 34* 29* 26*  CREATININE 1.36* 1.09 1.24  CALCIUM  9.3 8.9 8.6*  TSH  --  0.957  --    Liver Function Tests: Recent Labs    01/18/23 1450  AST 22  ALT 23  ALKPHOS 59  BILITOT 0.6  PROT 6.1  ALBUMIN 3.7   No results for input(s): "LIPASE", "AMYLASE" in the last 8760 hours. No results for input(s): "AMMONIA" in the last 8760 hours. CBC: Recent Labs    10/21/22 0149 10/22/22 1557 01/18/23 1450 08/23/23 0258  WBC 6.2 9.8 4.7 6.9  NEUTROABS 3.9 7,820*  --  5.6  HGB 12.7* 12.6* 11.9* 13.3  HCT 37.9* 36.8* 33.2* 38.8*  MCV 92.0 92.9 99* 90.7  PLT 217 235 198 156   Lipid Panel: Recent Labs    01/18/23 1450  CHOL 116  HDL 58  LDLCALC 46  TRIG 49  CHOLHDL 2.0   TSH: Recent Labs    01/18/23 1450  TSH 0.957   A1C: Lab Results  Component Value Date   HGBA1C 5.6 07/28/2012     Assessment/Plan *** There are no diagnoses linked to this encounter.   No follow-ups on file.: ***  Marisabel Macpherson K. Denney Fisherman [facility] [phone]

## 2023-09-17 ENCOUNTER — Other Ambulatory Visit: Payer: Self-pay | Admitting: *Deleted

## 2023-09-17 DIAGNOSIS — I5022 Chronic systolic (congestive) heart failure: Secondary | ICD-10-CM

## 2023-09-17 DIAGNOSIS — Z79899 Other long term (current) drug therapy: Secondary | ICD-10-CM

## 2023-09-18 ENCOUNTER — Telehealth: Payer: Self-pay | Admitting: Nurse Practitioner

## 2023-09-18 ENCOUNTER — Ambulatory Visit: Payer: Self-pay

## 2023-09-18 VITALS — BP 134/73 | HR 53

## 2023-09-18 DIAGNOSIS — I5020 Unspecified systolic (congestive) heart failure: Secondary | ICD-10-CM

## 2023-09-18 DIAGNOSIS — I25709 Atherosclerosis of coronary artery bypass graft(s), unspecified, with unspecified angina pectoris: Secondary | ICD-10-CM

## 2023-09-18 DIAGNOSIS — I480 Paroxysmal atrial fibrillation: Secondary | ICD-10-CM

## 2023-09-18 DIAGNOSIS — R296 Repeated falls: Secondary | ICD-10-CM

## 2023-09-18 DIAGNOSIS — M40203 Unspecified kyphosis, cervicothoracic region: Secondary | ICD-10-CM

## 2023-09-18 DIAGNOSIS — I10 Essential (primary) hypertension: Secondary | ICD-10-CM

## 2023-09-18 NOTE — Telephone Encounter (Signed)
 Order completed and printed for fax

## 2023-09-18 NOTE — Patient Instructions (Signed)
 Visit Information  Thank you for taking time to visit with me today. Please don't hesitate to contact me if I can be of assistance to you before our next scheduled appointment.  Our next appointment is by telephone on Thursday, June 12 at 1:00 PM Please call the care guide team at [phone] if you need to cancel or reschedule your appointment.   Following is a copy of your care plan:   Goals Addressed             This Visit's Progress    COMPLETED: To have no more falls       Care Coordination Interventions: See new goal      COMPLETED: To improve blood pressure control       Care Coordination Interventions: Evaluation of current treatment plan related to hypertension self management and patient's adherence to plan as established by provider Reviewed medications with patient and discussed importance of compliance, completed medication reconciliation with no discrepancies noted  Advised patient, providing education and rationale, to monitor blood pressure daily and record, calling PCP for findings outside established parameters Hypertension Interventions: Last practice recorded BP readings:  BP Readings from Last 3 Encounters:  09/18/23 134/73  09/16/23 136/78  09/12/23 (!) 114/54   Most recent eGFR/CrCl:  Lab Results  Component Value Date   EGFR 67 01/18/2023    No components found for: "CRCL"        COMPLETED: To maintain muscle mass by increasing protein       Care Coordination Interventions: Evaluation of current treatment plan related to Nutritional Imbalance and patient's adherence to plan as established by provider Determined patient continues to drink Boost or Ensure as protein supplementation at least 3 time weekly Patient reports having a good appetite and stable weight after discontinuing a medication that was initially prescribed to increase his appetite. No additional issues with indigestion Determined patient and wife are usually running out of food by the  middle of the month, therefore eating less healthy selections of food  Sent [phone] for care guide assistance with resources for food scarcity  Encouraged patient to continue to monitor his weights and keep his doctor informed of new symptoms or concerns     VBCI RN Care Plan related to Atrial Fibrillation       Problems:  Chronic Disease Management support and education needs related to Atrial Fibrillation Financial Constraints.  Goal: Over the next 90 days the Patient will continue to work with Medical illustrator and/or Social Worker to address care management and care coordination needs related to Atrial Fibrillation as evidenced by adherence to care management team scheduled appointments     verbalize understanding of plan for management of Atrial Fibrillation as evidenced by patient will have a good understanding of the treatment plan related to Watchman Placement per Dr. Marven Slimmer, Cardiologist   Interventions:   AFIB Interventions: Counseled on increased risk of stroke due to Afib and benefits of anticoagulation for stroke prevention Reviewed importance of adherence to anticoagulant exactly as prescribed Counseled on importance of regular laboratory monitoring as prescribed Counseled on seeking medical attention after a head injury or if there is blood in the urine/stool Assessed social determinant of health barriers  Patient Self-Care Activities:  Attend all scheduled provider appointments Call pharmacy for medication refills 3-7 days in advance of running out of medications Call provider office for new concerns or questions  Take medications as prescribed   Work with the nurse care manager to address care coordination needs  and will continue to work with the clinical team to address health care and disease management related needs bring symptom diary to all appointments keep all lab appointments take medicine as prescribed  Plan:  Follow up with provider re: Watchman Placement  evaluation with Dr. Marven Slimmer on 09/26/23 at 11:00 AM Telephone follow up appointment with care management team member scheduled for:  Thursday, June 12 at 1:00 PM      VBCI RN Care Plan related to High Risk for Injury secondary to Falls/anticoagulant use       Problems:  Chronic Disease Management support and education needs related to history of recurrent Biochemist, clinical. Home Safety Concerns Improper DME (patient needs a rollator)  Goal: Over the next 90 days the Patient will continue to work with Medical illustrator and/or Social Worker to address care management and care coordination needs related to history of recurrent Falls as evidenced by adherence to care management team scheduled appointments      Interventions:   Falls Interventions: Provided written and verbal education re: potential causes of falls and Fall prevention strategies Advised patient of importance of notifying provider of falls Assessed for falls since last encounter Assessed patients knowledge of fall risk prevention secondary to previously provided education Reviewed PCP referral for PT, reviewed referral status with patient and educated patient regarding next steps Assessed for DME needs, sent in basket message to PCP provider requesting an Rx for rollator to be sent to [facility]  Assessed for home safety concerns, referral sent per care guide to [facility]   Patient Self-Care Activities:  Attend all scheduled provider appointments Call pharmacy for medication refills 3-7 days in advance of running out of medications Call provider office for new concerns or questions  Take medications as prescribed   Work with PT for strengthening, endurance and improved mobility/gait Use your rollator once received from DME supplier to help reduce falls   Plan:  Contact outpatient PT provider to schedule your PT Telephone follow up appointment with care management team member scheduled for:  Thursday,  June 12 at1:00 PM             Please call 1-800-273-TALK (toll free, 24 hour hotline) if you are experiencing a Mental Health or Behavioral Health Crisis or need someone to talk to.  Patient verbalizes understanding of instructions and care plan provided today and agrees to view in MyChart. Active MyChart status and patient understanding of how to access instructions and care plan via MyChart confirmed with patient.     Louanne Roussel RN BSN CCM   [facility], [facility] Nurse Care Coordinator  Direct Dial: [phone] Website: [email or web address]

## 2023-09-18 NOTE — Patient Outreach (Signed)
 Complex Care Management   Visit Note  09/18/2023  Name:  Brent Griffith MRN: [id number] DOB: 11-Jun-1935  Situation: Referral received for Complex Care Management related to Atrial Fibrillation, CAD, Hypertension. I obtained verbal consent from Patient.  Visit completed with patient on the phone.  Background:   Past Medical History:  Diagnosis Date   Acute gastric ulcer without mention of hemorrhage, perforation, or obstruction    Allergic rhinitis, cause unspecified    Anemia, unspecified    Arthritis    Asthma    Blood transfusion without reported diagnosis    CAD (coronary artery disease)    Diaphragmatic hernia without mention of obstruction or gangrene    Elevated prostate specific antigen (PSA)    Enlarged prostate    Esophageal reflux    Extrinsic asthma, unspecified    Herpes zoster without mention of complication    Hyperlipidemia    Hypersomnia with sleep apnea, unspecified    Impotence of organic origin    Kyphosis    Lumbago    Obstructive sleep apnea (adult) (pediatric)    Pneumonia    hx of years ago    Senile osteoporosis    Trigger finger (acquired)    Tubular adenoma of colon 05/2013   Unspecified essential hypertension    Unspecified sleep apnea    CPAP- settings 4-12    Unspecified vitamin D deficiency     Assessment: Patient Reported Symptoms:  Cognitive Cognitive Status: Alert and oriented to person, place, and time, Struggling with memory recall Cognitive/Intellectual Conditions Management [RPT]: None reported or documented in medical history or problem list   Health Maintenance Behaviors: Annual physical exam, Immunizations Healing Pattern: Average Health Facilitated by: Rest  Neurological Neurological Review of Symptoms: No symptoms reported    HEENT HEENT Symptoms Reported: Nasal discharge, Other: Other HEENT Symptoms/Conditions: post nasal drainage with cough HEENT Management Strategies: Routine screening, Medication therapy HEENT  Self-Management Outcome: 3 (uncertain)    Cardiovascular Cardiovascular Symptoms Reported: No symptoms reported Does patient have uncontrolled Hypertension?: No Cardiovascular Conditions: Hypertension, Coronary artery disease, Dysrhythmia Cardiovascular Management Strategies: Medication therapy, Diet modification, Routine screening Cardiovascular Self-Management Outcome: 4 (good)  Respiratory Respiratory Symptoms Reported: Productive cough Respiratory Conditions: Seasonal allergies Respiratory Self-Management Outcome: 3 (uncertain)  Endocrine Patient reports the following symptoms related to hypoglycemia or hyperglycemia : No symptoms reported Is patient diabetic?: No    Gastrointestinal Gastrointestinal Symptoms Reported: Constipation Gastrointestinal Conditions: Reflux/heartburn Gastrointestinal Management Strategies: Medication therapy Gastrointestinal Self-Management Outcome: 4 (good) Nutrition Risk Screen (CP): Difficulty chewing/swallowing (missing teeth)  Genitourinary Genitourinary Symptoms Reported: No symptoms reported    Integumentary Integumentary Symptoms Reported: Skin changes Additional Integumentary Details: aging spots Skin Conditions: Other Other Skin Conditions: aging spots Skin Management Strategies: Routine screening Skin Self-Management Outcome: 3 (uncertain)  Musculoskeletal Musculoskelatal Symptoms Reviewed: Difficulty walking, Unsteady gait Additional Musculoskeletal Details: postural changes Musculoskeletal Conditions: Back pain, Osteoporosis, Unsteady gait, Mobility limited Musculoskeletal Management Strategies: Routine screening, Medical device Musculoskeletal Self-Management Outcome: 4 (good) Musculoskeletal Comment: PCP has referred patient outpatient PT Falls in the past year?: Yes Number of falls in past year: 2 or more Was there an injury with Fall?: Yes Fall Risk Category Calculator: 3 Patient Fall Risk Level: High Fall Risk Patient at Risk for  Falls Due to: Impaired balance/gait, Impaired mobility Fall risk Follow up: Education provided, Falls evaluation completed, Falls prevention discussed  Psychosocial Psychosocial Symptoms Reported: No symptoms reported   Major Change/Loss/Stressor/Fears (CP): Denies Quality of Family Relationships: involved, helpful, supportive Do you feel physically threatened  by others?: No      09/18/2023    1:37 PM  Depression screen PHQ 2/9  Decreased Interest 0  Down, Depressed, Hopeless 0  PHQ - 2 Score 0    Vitals:   09/18/23 1310  BP: 134/73  Pulse: (!) 53    Medications Reviewed Today     Reviewed by Kaylene Pascal, RN (Registered Nurse) on 09/18/23 at 1323  Med List Status: <None>   Medication Order Taking? Sig Documenting Provider Last Dose Status Informant  albuterol  (VENTOLIN  HFA) 108 (90 Base) MCG/ACT inhaler [id number] Yes Inhale 1-2 puffs into the lungs every 6 (six) hours as needed for wheezing or shortness of breath. Fargo, Amy E, NP Taking Active   alum & mag hydroxide-simeth (MAALOX MAX) 400-400-40 MG/5ML suspension [id number] Yes Take 10 mLs by mouth every 6 (six) hours as needed (pain with swallowing and reflux). Arnie Bibber, MD Taking Active   amiodarone  (PACERONE ) 200 MG tablet [id number] Yes Take 1 tablet (200 mg total) by mouth daily. Lenise Quince, MD Taking Active   Ascorbic Acid (VITAMIN C) 1000 MG tablet [id number] No Take 1,000 mg by mouth daily.  Patient not taking: Reported on 09/18/2023   [provider] Not Taking Active   Calcium  Citrate-Vitamin D (EQ CALCIUM  CITRATE+D3 PO) [id number] Yes Take 1,200 mg by mouth daily. [provider] Taking Consider Medication Status and Discontinue Self           Med Note Minerva Alvine May 27, 2023  2:23 PM)    carvedilol  (COREG ) 3.125 MG tablet [id number] Yes TAKE 1 TABLET BY MOUTH TWICE  DAILY Crenshaw, Deannie Fabian, MD Taking Active   Coenzyme Q10 (COQ10) 200 MG CAPS [id number] No Take 200 mg  by mouth daily.  Patient not taking: Reported on 09/18/2023   [provider] Not Taking Active Self  doxazosin  (CARDURA ) 2 MG tablet [id number] Yes TAKE 1 TABLET BY MOUTH DAILY Crenshaw, Deannie Fabian, MD Taking Active   empagliflozin  (JARDIANCE ) 10 MG TABS tablet [id number] Yes Take 1 tablet (10 mg total) by mouth daily before breakfast. Lenise Quince, MD Taking Active   famotidine  (PEPCID ) 20 MG tablet [id number] Yes Take 1 tablet (20 mg total) by mouth 2 (two) times daily. Suzanna Erp M, PA-C Taking Active   finasteride  (PROSCAR ) 5 MG tablet [id number] Yes TAKE 1 TABLET BY MOUTH IN  THE MORNING Eubanks, Jessica K, NP Taking Active   fluticasone  (FLONASE ) 50 MCG/ACT nasal spray [id number] No Place 1 spray into both nostrils daily.  Patient not taking: Reported on 09/18/2023   Kelsey Patricia, MD Not Taking Active   furosemide  (LASIX ) 20 MG tablet [id number] Yes TAKE 1 TABLET BY MOUTH DAILY AS  NEEDED FOR LEG SWELLING Crenshaw, Deannie Fabian, MD Taking Active   Iron, Ferrous Sulfate, 325 (65 Fe) MG TABS [id number] Yes Take 325 mg by mouth 2 (two) times daily. Verma Gobble, NP Taking Active   lactose free nutrition (BOOST) LIQD [id number] Yes Take 237 mLs by mouth every Monday, Wednesday, and Friday. [provider] Taking Active   losartan  (COZAAR ) 25 MG tablet [id number] Yes TAKE 1 TABLET BY MOUTH DAILY  PLEASE KEEP SCHEDULED  APPOINTMENT WITH CARDIOLOGIST Lenise Quince, MD Taking Active   mexiletine (MEXITIL) 150 MG capsule [id number] Yes Take 1 capsule (150 mg total) by mouth 2 (two) times daily. Lenise Quince, MD Taking Active   pantoprazole  (PROTONIX ) 40 MG tablet [id number] Yes TAKE 1 TABLET BY MOUTH  DAILY Eubanks, Jessica K, NP Taking Active   rosuvastatin  (CRESTOR ) 20 MG tablet [id number] Yes TAKE 1 TABLET BY MOUTH ONCE  DAILY FOR CHOLESTEROL Eubanks, Jessica K, NP Taking Active   warfarin (COUMADIN ) 2.5 MG tablet [id number] Yes TAKE 1/2 TO 1 TABLET BY MOUTH  DAILY AS  DIRECTED BY THE  COUMADIN  CLINIC Crenshaw, Deannie Fabian, MD Taking Active   Med List Note Faustina Hood, MD 07/15/14 2050): CPAP Advanced AutoPap 6-12            Recommendation:   Specialty provider follow-up with Dr. Marven Slimmer, Cardiologist on 09/26/23 at 11:00 AM for evaluation of Watchman placement   Follow Up Plan:   Telephone follow up appointment date/time:  Thursday, June 12 at 1:00 PM Referral to Care Guide re: resources for food/utilities Referral to Aging Gracefully   Louanne Roussel RN BSN CCM [facility]  Value-Based Care Institute, [facility] Nurse Care Coordinator  Direct Dial: [phone] Website: [email or web address]

## 2023-09-18 NOTE — Telephone Encounter (Signed)
-----   Message from Nurse Skeeter Dukes sent at 09/18/2023  4:32 PM EDT ----- Regarding: Re: needs Rx for Rollator Hello Camilo Cella,   Can you please send a new Rx for a Rollator to [facility] for patient? Please include patient's weight and height.   Thanks, [employer]

## 2023-09-19 DIAGNOSIS — E559 Vitamin D deficiency, unspecified: Secondary | ICD-10-CM | POA: Insufficient documentation

## 2023-09-19 DIAGNOSIS — R296 Repeated falls: Secondary | ICD-10-CM | POA: Insufficient documentation

## 2023-09-19 NOTE — Assessment & Plan Note (Signed)
 Hgb stable on last labs continues on iron supplement.

## 2023-09-19 NOTE — Assessment & Plan Note (Signed)
 Stable, continues on proscar 

## 2023-09-19 NOTE — Assessment & Plan Note (Signed)
 Chronic and stable Encourage proper hydration Follow metabolic panel Avoid nephrotoxic meds (NSAIDS)

## 2023-09-19 NOTE — Assessment & Plan Note (Signed)
 Continues on CPAP.

## 2023-09-19 NOTE — Assessment & Plan Note (Signed)
 Rate controlled, continues on warfarin for anticoagulant

## 2023-09-19 NOTE — Assessment & Plan Note (Signed)
Continues on supplement 

## 2023-09-19 NOTE — Assessment & Plan Note (Signed)
 Stable, no reports on chest pains, continues on coreg  BiD

## 2023-09-19 NOTE — Assessment & Plan Note (Signed)
 Euvolemic at this time, continue to monitor.

## 2023-09-19 NOTE — Assessment & Plan Note (Signed)
 Ongoing, continue fall precautions and recommend PT

## 2023-09-19 NOTE — Telephone Encounter (Signed)
 Noted. Demographics and Order has been placed in the box to be faxed.  Zada Herrlich, NP has been notified.  Message sent to Verma Gobble, NP

## 2023-09-19 NOTE — Assessment & Plan Note (Signed)
To take Loratadine or cetrizine (generic for Claritin or zyrtec) 10 mg by mouth daily for allergies.  

## 2023-09-19 NOTE — Assessment & Plan Note (Signed)
 Blood pressure well controlled, goal bp <140/90 Continue current medications and dietary modifications follow metabolic panel

## 2023-09-20 ENCOUNTER — Encounter: Payer: Self-pay | Admitting: Cardiology

## 2023-09-24 ENCOUNTER — Telehealth: Payer: Self-pay | Admitting: *Deleted

## 2023-09-24 NOTE — Progress Notes (Signed)
 Complex Care Management Note Care Guide Note  09/24/2023 Name: Brent Griffith MRN: [id number] DOB: 11-Dec-1935   Complex Care Management Outreach Attempts: An unsuccessful telephone outreach was attempted today to offer the patient information about available complex care management services.  Follow Up Plan:  Additional outreach attempts will be made to offer the patient complex care management information and services.   Encounter Outcome:  No Answer  [name] HealthPopulation Health Care Guide  Direct [phone] [phone] Website: [email or web address]

## 2023-09-26 ENCOUNTER — Ambulatory Visit: Attending: Cardiology | Admitting: Cardiology

## 2023-09-26 ENCOUNTER — Encounter: Payer: Self-pay | Admitting: Cardiology

## 2023-09-26 ENCOUNTER — Other Ambulatory Visit: Payer: Self-pay

## 2023-09-26 VITALS — BP 136/60 | HR 56 | Ht 62.0 in | Wt 127.0 lb

## 2023-09-26 DIAGNOSIS — I4891 Unspecified atrial fibrillation: Secondary | ICD-10-CM | POA: Diagnosis not present

## 2023-09-26 NOTE — Progress Notes (Signed)
  Electrophysiology Office Note:    Date:  09/26/2023   ID:  [name], DOB [birth year], MRN [id number]  [facility] Cardiologist:  Alexandria Angel, MD  [facility] Electrophysiologist:  Boyce Byes, MD   Referring MD: Lenise Quince, MD   Chief Complaint: Atrial fibrillation  History of Present Illness:    Mr. Brent Griffith is an 88 year old man who I am seeing today for an evaluation of atrial fibrillation at the request of Dr. Audery Blazing.  The patient has a history of severe coronary artery disease with prior CABG.  He has ischemic cardiomyopathy with a mildly reduced ejection fraction.  He was diagnosed with atrial fibrillation in September 2022 and was started on amiodarone .  At the last appointment with Dr. Audery Blazing on Sep 12, 2023, he was maintaining sinus rhythm.  Given the patient's history of fall with personal injury, he is referred to discuss possible left atrial appendage occlusion.  He is with his family today in clinic.  He is using a rollator walker.  He is in a neck brace because of severe kyphosis.    Their past medical, social and family history was reviewed.   ROS:   Please see the history of present illness.    All other systems reviewed and are negative.  EKGs/Labs/Other Studies Reviewed:    The following studies were reviewed today:  September 2022 echo EF 40% RV normal  Sep 12, 2023 EKG shows sinus rhythm, left bundle branch block  CT neck reviewed in the clinic room with the patient and his family.  Severe vertebral deviation      Physical Exam:    VS:  There were no vitals taken for this visit.    Wt Readings from Last 3 Encounters:  09/16/23 127 lb 12.8 oz (58 kg)  09/12/23 127 lb (57.6 kg)  08/23/23 119 lb 14.9 oz (54.4 kg)     GEN: no distress.  Elderly.  Using rollator walker. CARD: RRR, No MRG RESP: No IWOB. CTAB.        ASSESSMENT AND PLAN:    No diagnosis found.  #Persistent atrial fibrillation #High risk med  monitoring-amiodarone  The patient has atrial fibrillation associated with reduced ejection fraction and coronary artery disease.  Rhythm control is indicated.  Thankfully the amiodarone  seems to be effective for him.  Update CMP, TSH and free T4 today.  Last check of these in September 2024 showed stable function.  I discussed the stroke risk associated with his atrial fibrillation during today's clinic appointment.  I discussed anticoagulation and the role of left atrial appendage occlusion.  Given the patient's history of fall with personal injury, he would be interested in pursuing left atrial appendage occlusion.    Fortunately, the patient has severe kyphosis and I believe this puts him at prohibitive risk to undergo intubation and transesophageal echo.  Further, his age and decreased mobility put him at high risk.  This was discussed in detail the patient.  He will continue on Coumadin  for now.    Follow-up with the EP APP in 6 months.   Signed, Leanora Prophet. Marven Slimmer, MD, [facility], [facility] 09/26/2023 6:16 AM    Electrophysiology [facility]

## 2023-09-26 NOTE — Patient Instructions (Addendum)
 Medication Instructions:  Your physician recommends that you continue on your current medications as directed. Please refer to the Current Medication list given to you today.  *If you need a refill on your cardiac medications before your next appointment, please call your pharmacy*  Labs: CMET, TSH, T4  Follow-Up: At [facility], you and your health needs are our priority.  As part of our continuing mission to provide you with exceptional heart care, our providers are all part of one team.  This team includes your primary Cardiologist (physician) and Advanced Practice Providers or APPs (Physician Assistants and Nurse Practitioners) who all work together to provide you with the care you need, when you need it.  Your next appointment:   6 months with EP APP

## 2023-09-27 ENCOUNTER — Other Ambulatory Visit: Payer: Self-pay | Admitting: Cardiology

## 2023-10-02 ENCOUNTER — Other Ambulatory Visit: Payer: Self-pay | Admitting: Cardiology

## 2023-10-02 ENCOUNTER — Telehealth: Payer: Self-pay | Admitting: *Deleted

## 2023-10-02 DIAGNOSIS — I255 Ischemic cardiomyopathy: Secondary | ICD-10-CM

## 2023-10-02 DIAGNOSIS — I4891 Unspecified atrial fibrillation: Secondary | ICD-10-CM

## 2023-10-02 DIAGNOSIS — I480 Paroxysmal atrial fibrillation: Secondary | ICD-10-CM

## 2023-10-02 DIAGNOSIS — I2510 Atherosclerotic heart disease of native coronary artery without angina pectoris: Secondary | ICD-10-CM

## 2023-10-02 DIAGNOSIS — I10 Essential (primary) hypertension: Secondary | ICD-10-CM

## 2023-10-02 NOTE — Progress Notes (Signed)
 Complex Care Management Note Care Guide Note  10/02/2023 Name: Brent Griffith MRN: [id number] DOB: 20-May-1935   Complex Care Management Outreach Attempts: An unsuccessful telephone outreach was attempted today to offer the patient information about available complex care management services.  Follow Up Plan:  Additional outreach attempts will be made to offer the patient complex care management information and services.   Encounter Outcome:  No Answer  [name] HealthPopulation Health Care Guide  Direct Dial:[phone] [phone] Website: [email or web address]

## 2023-10-03 ENCOUNTER — Telehealth: Payer: Self-pay | Admitting: *Deleted

## 2023-10-03 ENCOUNTER — Encounter: Payer: Self-pay | Admitting: Podiatry

## 2023-10-03 ENCOUNTER — Ambulatory Visit (INDEPENDENT_AMBULATORY_CARE_PROVIDER_SITE_OTHER): Payer: Medicare Other | Admitting: Podiatry

## 2023-10-03 DIAGNOSIS — M79674 Pain in right toe(s): Secondary | ICD-10-CM

## 2023-10-03 DIAGNOSIS — M79675 Pain in left toe(s): Secondary | ICD-10-CM

## 2023-10-03 DIAGNOSIS — B351 Tinea unguium: Secondary | ICD-10-CM | POA: Diagnosis not present

## 2023-10-03 DIAGNOSIS — I4891 Unspecified atrial fibrillation: Secondary | ICD-10-CM | POA: Diagnosis not present

## 2023-10-03 DIAGNOSIS — Z7901 Long term (current) use of anticoagulants: Secondary | ICD-10-CM | POA: Diagnosis not present

## 2023-10-03 NOTE — Progress Notes (Signed)
  Subjective:  Patient ID: Brent Griffith, male    DOB: 1936/02/22,   MRN: [id number]  No chief complaint on file.   88 y.o. male presents for concern of thickened elongated and painful nails that are difficult to trim. Requesting to have them trimmed today. He is on coumadin  and at risk for foot care.   PCP:  Verma Gobble, NP   .  Aaron AasHe is on coumadin .  Denies any other pedal complaints. Denies n/v/f/c.   Past Medical History:  Diagnosis Date   Acute gastric ulcer without mention of hemorrhage, perforation, or obstruction    Allergic rhinitis, cause unspecified    Anemia, unspecified    Arthritis    Asthma    Blood transfusion without reported diagnosis    CAD (coronary artery disease)    Diaphragmatic hernia without mention of obstruction or gangrene    Elevated prostate specific antigen (PSA)    Enlarged prostate    Esophageal reflux    Extrinsic asthma, unspecified    Herpes zoster without mention of complication    Hyperlipidemia    Hypersomnia with sleep apnea, unspecified    Impotence of organic origin    Kyphosis    Lumbago    Obstructive sleep apnea (adult) (pediatric)    Pneumonia    hx of years ago    Senile osteoporosis    Trigger finger (acquired)    Tubular adenoma of colon 05/2013   Unspecified essential hypertension    Unspecified sleep apnea    CPAP- settings 4-12    Unspecified vitamin D deficiency     Objective:  Physical Exam: Vascular: DP/PT pulses 2/4 bilateral. CFT <3 seconds. Normal hair growth on digits. No edema.  Skin. No lacerations or abrasions bilateral feet. Nails 1-5 bilateral are thickened elongated and dsytrophic with subungual debris. Musculoskeletal: MMT 5/5 bilateral lower extremities in DF, PF, Inversion and Eversion. Deceased ROM in DF of ankle joint.  Neurological: Sensation intact to light touch.   Assessment:   1. Pain due to onychomycosis of toenails of both feet   2. Chronic anticoagulation      Plan:  Patient was  evaluated and treated and all questions answered.  -Mechanically debrided all nails 1-5 bilateral using sterile nail nipper and filed with dremel without incident  -Answered all patient questions -Patient to return  in 3 months for at risk foot care -Patient advised to call the office if any problems or questions arise in the meantime.   Jennefer Moats, DPM

## 2023-10-03 NOTE — Progress Notes (Signed)
 Complex Care Management Note Care Guide Note  10/03/2023 Name: Brent Griffith MRN: [id number] DOB: April 19, 1936   Complex Care Management Outreach Attempts: A third unsuccessful outreach was attempted today to offer the patient with information about available complex care management services.  Follow Up Plan:  Additional outreach attempts will be made to offer the patient complex care management information and services.   Encounter Outcome:  No Answer [name] HealthPopulation Health Care Guide  Direct [phone] Fax:[phone] Website: [email or web address]

## 2023-10-04 ENCOUNTER — Telehealth: Payer: Self-pay | Admitting: *Deleted

## 2023-10-04 ENCOUNTER — Other Ambulatory Visit: Payer: Self-pay | Admitting: Gastroenterology

## 2023-10-04 LAB — COMPREHENSIVE METABOLIC PANEL WITH GFR
ALT: 25 IU/L (ref 0–44)
AST: 29 IU/L (ref 0–40)
Albumin: 3.8 g/dL (ref 3.7–4.7)
Alkaline Phosphatase: 57 IU/L (ref 44–121)
BUN/Creatinine Ratio: 20 (ref 10–24)
BUN: 21 mg/dL (ref 8–27)
Bilirubin Total: 0.8 mg/dL (ref 0.0–1.2)
CO2: 20 mmol/L (ref 20–29)
Calcium: 8.7 mg/dL (ref 8.6–10.2)
Chloride: 106 mmol/L (ref 96–106)
Creatinine, Ser: 1.07 mg/dL (ref 0.76–1.27)
Globulin, Total: 2.2 g/dL (ref 1.5–4.5)
Glucose: 91 mg/dL (ref 70–99)
Potassium: 4.1 mmol/L (ref 3.5–5.2)
Sodium: 140 mmol/L (ref 134–144)
Total Protein: 6 g/dL (ref 6.0–8.5)
eGFR: 68 mL/min/{1.73_m2} (ref >59)

## 2023-10-04 LAB — T4, FREE: Free T4: 1.72 ng/dL (ref 0.82–1.77)

## 2023-10-04 LAB — TSH: TSH: 1.49 u[IU]/mL (ref 0.450–4.500)

## 2023-10-04 NOTE — Progress Notes (Signed)
 Complex Care Management Note Care Guide Note  10/04/2023 Name: Brent Griffith MRN: [id number] DOB: 1935/11/27  Brent Griffith is a 88 y.o. year old male who is a primary care patient of Verma Gobble, NP . The community resource team was consulted for assistance with Food Insecurity  SDOH screenings and interventions completed:  No    Will mail Food bank listing although no answer from patient     Care guide performed the following interventions: Will mail Food bank listing although no answer from patient .  Follow Up Plan:  No further follow up planned at this time. The patient has been provided with needed resources.  Encounter Outcome:  No Answer Brent Griffith HealthPopulation Health Care Guide  Direct [phone] Fax:[phone] Website: [email or web address]

## 2023-10-10 ENCOUNTER — Ambulatory Visit (HOSPITAL_BASED_OUTPATIENT_CLINIC_OR_DEPARTMENT_OTHER)
Admission: RE | Admit: 2023-10-10 | Discharge: 2023-10-10 | Disposition: A | Discharge status: Home / Self Care | Source: Ambulatory Visit | Attending: Cardiology | Admitting: Cardiology

## 2023-10-10 DIAGNOSIS — I480 Paroxysmal atrial fibrillation: Secondary | ICD-10-CM | POA: Diagnosis not present

## 2023-10-10 DIAGNOSIS — I2510 Atherosclerotic heart disease of native coronary artery without angina pectoris: Secondary | ICD-10-CM | POA: Insufficient documentation

## 2023-10-10 DIAGNOSIS — I255 Ischemic cardiomyopathy: Secondary | ICD-10-CM | POA: Diagnosis not present

## 2023-10-10 LAB — ECHOCARDIOGRAM COMPLETE
AR max vel: 2.29 cm2
AV Area VTI: 2.59 cm2
AV Area mean vel: 2.55 cm2
AV Mean grad: 4 mmHg
AV Peak grad: 9.6 mmHg
AV Vena cont: 0.3 cm
Ao pk vel: 1.55 m/s
Area-P 1/2: 2.36 cm2
Calc EF: 61.7 %
MV M vel: 2.45 m/s
MV Peak grad: 23.9 mmHg
P 1/2 time: 719 ms
S' Lateral: 3.1 cm
Single Plane A2C EF: 64.4 %
Single Plane A4C EF: 59.1 %

## 2023-10-11 ENCOUNTER — Ambulatory Visit: Attending: Cardiology | Admitting: *Deleted

## 2023-10-11 ENCOUNTER — Ambulatory Visit: Payer: Self-pay | Admitting: Cardiology

## 2023-10-11 ENCOUNTER — Other Ambulatory Visit (HOSPITAL_COMMUNITY): Payer: Self-pay

## 2023-10-11 DIAGNOSIS — I4891 Unspecified atrial fibrillation: Secondary | ICD-10-CM | POA: Diagnosis not present

## 2023-10-11 DIAGNOSIS — Z5181 Encounter for therapeutic drug level monitoring: Secondary | ICD-10-CM

## 2023-10-11 LAB — POCT INR: INR: 2.4 (ref 2.0–3.0)

## 2023-10-11 NOTE — Patient Instructions (Addendum)
 Description   Continue taking 1/2 tablet daily, EXCEPT 1 tablet on Mondays and Fridays.  Stay consistent with boost/ensure (Mon, Wed, and Fri)  Recheck INR in 5 weeks. Coumadin  Clinic [phone]

## 2023-10-14 ENCOUNTER — Telehealth: Payer: Self-pay | Admitting: *Deleted

## 2023-10-14 DIAGNOSIS — I10 Essential (primary) hypertension: Secondary | ICD-10-CM

## 2023-10-14 NOTE — Progress Notes (Signed)
 Complex Care Management Care Guide Note  10/14/2023 Name: Brent Griffith MRN: [id number] DOB: November 22, 1935  Brent Griffith is a 88 y.o. year old male who is a primary care patient of Eubanks, Jessica K, NP and is actively engaged with the care management team. I reached out to Brent Griffith by phone today to assist with scheduling  with the BSW.  Follow up plan: Telephone appointment with complex care management team member scheduled for:  10/15/22  Brent Griffith  [facility]  Value-Based Care Institute, [facility] Guide  Direct Dial: [phone]  Fax [phone]

## 2023-10-15 ENCOUNTER — Other Ambulatory Visit: Admitting: Licensed Clinical Social Worker

## 2023-10-15 NOTE — Patient Instructions (Signed)
 Visit Information  Thank you for taking time to visit with me today. Please don't hesitate to contact me if I can be of assistance to you before our next scheduled appointment.  Our next appointment is by telephone on 10/29/2023 at 2:00 pm Please call the care guide team at [phone] if you need to cancel or reschedule your appointment.   Following is a copy of your care plan:   Goals Addressed             This Visit's Progress    BSW VBCI Social Work Care Plan       Problems:   Corporate treasurer  and Wants Traditional Medicaid2  CSW Clinical Goal(s):   Over the next 2 weeks the Patient will will follow up with referral with the walk in [facility] as directed by Social Work.  Interventions:  SW will make the referral to the walk in Medicaid clinic  Patient Goals/Self-Care Activities:  Coordinate with walk in [facility] to assist with for Medicaid.  Plan:   Telephone follow up appointment with care management team member scheduled for:  10/29/2023 at 2:00 pm        Please call the Suicide and Crisis Lifeline: 988 go to [facility] [address], [place] [phone]) call 911 if you are experiencing a Mental Health or Behavioral Health Crisis or need someone to talk to.  Patient verbalizes understanding of instructions and care plan provided today and agrees to view in MyChart. Active MyChart status and patient understanding of how to access instructions and care plan via MyChart confirmed with patient.     Jonda Neighbours, PhD [facility], [facility] Social Worker Direct Dial: [phone]  Fax: [phone]

## 2023-10-15 NOTE — Patient Outreach (Signed)
 Complex Care Management   Visit Note  10/15/2023  Name:  Brent Griffith MRN: [id number] DOB: 04-07-1936  Situation: Referral received for Complex Care Management related to SDOH Barriers:  Financial Resource Strain for copaysI obtained verbal consent from Patient.  Visit completed with pa  on the phone  Background:   Past Medical History:  Diagnosis Date   Acute gastric ulcer without mention of hemorrhage, perforation, or obstruction    Allergic rhinitis, cause unspecified    Anemia, unspecified    Arthritis    Asthma    Blood transfusion without reported diagnosis    CAD (coronary artery disease)    Diaphragmatic hernia without mention of obstruction or gangrene    Elevated prostate specific antigen (PSA)    Enlarged prostate    Esophageal reflux    Extrinsic asthma, unspecified    Herpes zoster without mention of complication    Hyperlipidemia    Hypersomnia with sleep apnea, unspecified    Impotence of organic origin    Kyphosis    Lumbago    Obstructive sleep apnea (adult) (pediatric)    Pneumonia    hx of years ago    Senile osteoporosis    Trigger finger (acquired)    Tubular adenoma of colon 05/2013   Unspecified essential hypertension    Unspecified sleep apnea    CPAP- settings 4-12    Unspecified vitamin D deficiency     Assessment: Patient stated that he has the family planning Medicaid and wants the traditional so that it will pay for PCP co-pays.  Recommendation:   none  Follow Up Plan:   Telephone follow up appointment date/time:  10/29/2023 at 2:00 pm  Jonda Neighbours, PhD [facility], [facility] Social Worker Direct Dial: [phone]  Fax: [phone]

## 2023-10-17 ENCOUNTER — Telehealth: Payer: Self-pay

## 2023-10-17 NOTE — Progress Notes (Signed)
 Complex Care Management Care Guide Note  10/17/2023 Name: Brent Griffith MRN: [id number] DOB: Apr 17, 1936  Brent Griffith is a 88 y.o. year old male who is a primary care patient of Eubanks, Jessica K, NP and is actively engaged with the care management team. I reached out to Brent Griffith by phone today to assist with re-scheduling  with the RN Case Manager.  Follow up plan: Unsuccessful telephone outreach attempt made. A HIPAA compliant phone message was left for the patient providing contact information and requesting a return call.  [facility], [facility] Guide  Direct Dial: [phone]  Fax [phone]

## 2023-10-29 ENCOUNTER — Other Ambulatory Visit: Payer: Self-pay | Admitting: Licensed Clinical Social Worker

## 2023-10-30 ENCOUNTER — Encounter: Payer: Self-pay | Admitting: Physical Therapy

## 2023-10-30 ENCOUNTER — Ambulatory Visit: Attending: Nurse Practitioner | Admitting: Physical Therapy

## 2023-10-30 ENCOUNTER — Other Ambulatory Visit: Payer: Self-pay

## 2023-10-30 DIAGNOSIS — R2681 Unsteadiness on feet: Secondary | ICD-10-CM | POA: Insufficient documentation

## 2023-10-30 DIAGNOSIS — R296 Repeated falls: Secondary | ICD-10-CM | POA: Diagnosis not present

## 2023-10-30 DIAGNOSIS — M6281 Muscle weakness (generalized): Secondary | ICD-10-CM | POA: Diagnosis not present

## 2023-10-30 DIAGNOSIS — M4123 Other idiopathic scoliosis, cervicothoracic region: Secondary | ICD-10-CM | POA: Diagnosis not present

## 2023-10-30 DIAGNOSIS — M542 Cervicalgia: Secondary | ICD-10-CM | POA: Diagnosis not present

## 2023-10-30 DIAGNOSIS — R293 Abnormal posture: Secondary | ICD-10-CM | POA: Diagnosis not present

## 2023-10-30 NOTE — Therapy (Signed)
 OUTPATIENT PHYSICAL THERAPY NEURO EVALUATION   Patient Name: Brent Griffith MRN: [id number] DOB:[birth year], 88 y.o., male Today's Date: 10/30/2023   PCP: [name], NP REFERRING PROVIDER: [name], NP  END OF SESSION:  PT End of Session - 10/30/23 1358     Visit Number 1    Number of Visits 16    Date for PT Re-Evaluation 12/25/23    Authorization Type [employer]    Authorization Time Period Auth requested 10/30/23    Progress Note Due on Visit 10    PT Start Time 1358    PT Stop Time 1440    PT Time Calculation (min) 42 min          Past Medical History:  Diagnosis Date   Acute gastric ulcer without mention of hemorrhage, perforation, or obstruction    Allergic rhinitis, cause unspecified    Anemia, unspecified    Arthritis    Asthma    Blood transfusion without reported diagnosis    CAD (coronary artery disease)    Diaphragmatic hernia without mention of obstruction or gangrene    Elevated prostate specific antigen (PSA)    Enlarged prostate    Esophageal reflux    Extrinsic asthma, unspecified    Herpes zoster without mention of complication    Hyperlipidemia    Hypersomnia with sleep apnea, unspecified    Impotence of organic origin    Kyphosis    Lumbago    Obstructive sleep apnea (adult) (pediatric)    Pneumonia    hx of years ago    Senile osteoporosis    Trigger finger (acquired)    Tubular adenoma of colon 05/2013   Unspecified essential hypertension    Unspecified sleep apnea    CPAP- settings 4-12    Unspecified vitamin D deficiency    Past Surgical History:  Procedure Laterality Date   CARDIAC CATHETERIZATION  03/04/2009   Dr Kerrin   COLONOSCOPY     CORONARY ARTERY BYPASS GRAFT  2010   CYSTOSCOPY WITH RETROGRADE PYELOGRAM, URETEROSCOPY AND STENT PLACEMENT Right 10/04/2013   Procedure: CYSTOSCOPY WITH RETROGRADE PYELOGRAM,  AND STENT PLACEMENT;  Surgeon: Donnice Brooks, MD;  Location: [facility];  Service: Urology;   Laterality: Right;   DENTAL SURGERY N/A    MOLE REMOVAL  1958   chin   ROTATOR CUFF REPAIR Left 04/1998   with bone spur removed, Dr Shari   TONSILLECTOMY  1945   TRANSURETHRAL RESECTION OF PROSTATE N/A 09/24/2013   Procedure: TRANSURETHRAL RESECTION OF THE PROSTATE (TURP) WITH GYRUS (STAGED RIGHT LATERAL AND MEDIAN LOBE);  Surgeon: Arlena LILLETTE Gal, MD;  Location: [facility];  Service: Urology;  Laterality: N/A;   UPPER GI ENDOSCOPY     Patient Active Problem List   Diagnosis Date Noted   Vitamin D deficiency 09/19/2023   Recurrent falls 09/19/2023   CKD (chronic kidney disease) stage 3, GFR 30-59 ml/min (HCC) 07/06/2022   Atrial fibrillation (HCC) 10/25/2021   HFrEF (heart failure with reduced ejection fraction) (HCC) 01/19/2021   CAD (coronary artery disease) 10/11/2017   IDA (iron deficiency anemia) 08/31/2016   Seasonal and perennial allergic rhinitis 08/28/2016   Palpitations 09/07/2015   Cough, persistent 07/20/2015   Senile osteoporosis 06/09/2014   Hydronephrosis of right kidney 10/03/2013   Benign prostatic hyperplasia 09/24/2013   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   Dyslipidemia 04/06/2009   OSA (obstructive sleep apnea) 02/04/2009   Essential hypertension 09/02/2008   GERD (gastroesophageal reflux disease) 09/02/2008    ONSET  DATE: Chronic  REFERRING DIAG: R29.6 (ICD-10-CM) - Recurrent falls  THERAPY DIAG:  Frequent falls  Unsteadiness on feet  Muscle weakness (generalized)  Abnormal posture  Rationale for Evaluation and Treatment: Rehabilitation  SUBJECTIVE:                                                                                                                                                                                             SUBJECTIVE STATEMENT: Pt comes in due to falls. Has had history of falls for the last 3 years. Pt is on warfarin and goes to coumadin  clinic. Pt reports he normally falls forward. Has had PT in the past but has not been  doing his exercises. Feels he has gotten weaker and not walking as far since stopping PT. Normally able to walk more around his neighborhood but currently only able to walk [place] 2-3 times. Recently got his rollator ~1 month ago Pt accompanied by: significant other, [name] (wife)  PERTINENT HISTORY: Wears thoracic/cervical brace at baseline. History of severe coronary artery disease with prior CABG. He has ischemic cardiomyopathy with a mildly reduced ejection fraction. He was diagnosed with atrial fibrillation in September 2022 and was started on amiodarone .   PAIN:  Are you having pain? No  PRECAUTIONS: Fall  RED FLAGS: None   WEIGHT BEARING RESTRICTIONS: No  FALLS: Has patient fallen in last 6 months? Yes. Number of falls at least 2 falls -- last fall on 08/23/23 slid off bed and hit head on his dresser  LIVING ENVIRONMENT: Lives with: lives with their spouse Lives in: House/apartment Stairs: 2 step ups to enter Has following equipment at home: Vannie - 4 wheeled  PLOF: Independent  PATIENT GOALS: Improve balance  OBJECTIVE:  Note: Objective measures were completed at Evaluation unless otherwise noted.  DIAGNOSTIC FINDINGS: CT cervical spine 08/23/23 IMPRESSION: 1. No acute traumatic injury identified in the cervical spine. 2. Chronic osteopenia, upper thoracic compression fractures with ankylosis and kyphosis.    COGNITION: Overall cognitive status: Within functional limits for tasks assessed   SENSATION: WFL  EDEMA:  None  MUSCLE TONE: Did not assess  DTRs:  Did not assess  POSTURE: Increased kyphosis. Wears neck and thoracic brace  LOWER EXTREMITY ROM:     Active  Right Eval Left Eval  Hip flexion    Hip extension    Hip abduction    Hip adduction    Hip internal rotation    Hip external rotation    Knee flexion    Knee extension    Ankle dorsiflexion    Ankle plantarflexion    Ankle  inversion    Ankle eversion     (Blank rows =  not tested)  LOWER EXTREMITY MMT:    MMT Right Eval Left Eval  Hip flexion 5 4  Hip extension 4- 3+  Hip abduction 3 3  Hip adduction    Hip internal rotation    Hip external rotation    Knee flexion 5 5  Knee extension 5 4-  Ankle dorsiflexion    Ankle plantarflexion    Ankle inversion    Ankle eversion    (Blank rows = not tested)  BED MOBILITY:  SBA for safety  TRANSFERS: Sit to stand: Modified independence  Assistive device utilized: Environmental consultant - 4 wheeled     Stand to sit: Modified independence  Assistive device utilized: hands to bed      RAMP:  Not tested  CURB:  Not tested  STAIRS: Not tested GAIT: Findings: Distance walked: 180' and Comments: Decreased bilat step length, forward head  FUNCTIONAL TESTS:  5 times sit to stand: 13.22 sec using back of legs against bed Mini-BESTest: 14  [facility] PT Assessment - 10/30/23 0001       Standardized Balance Assessment   Standardized Balance Assessment Mini-BESTest      Mini-BESTest   Sit To Stand Normal: Comes to stand without use of hands and stabilizes independently.    Rise to Toes Moderate: Heels up, but not full range (smaller than when holding hands), OR noticeable instability for 3 s.    Stand on one leg (left) Severe: Unable    Stand on one leg (right) Moderate: < 20 s    Stand on one leg - lowest score 0    Compensatory Stepping Correction - Forward No step, OR would fall if not caught, OR falls spontaneously.    Compensatory Stepping Correction - Backward No step, OR would fall if not caught, OR falls spontaneously.    Compensatory Stepping Correction - Left Lateral Normal: Recovers independently with 1 step (crossover or lateral OK)    Compensatory Stepping Correction - Right Lateral Normal: Recovers independently with 1 step (crossover or lateral OK)    Stepping Corredtion Lateral - lowest score 2    Stance - Feet together, eyes open, firm surface  Normal: 30s    Stance - Feet together, eyes closed, foam  surface  Normal: 30s    Incline - Eyes Closed Moderate: Stands independently < 30s OR aligns with surface    Change in Gait Speed Normal: Significantly changes walkling speed without imbalance   11 sec 20'   Walk with head turns - Horizontal Moderate: performs head turns with reduction in gait speed.    Walk with pivot turns Moderate:Turns with feet close SLOW (>4 steps) with good balance.    Step over obstacles Severe: Unable to step over box OR steps around box    Timed UP & GO with Dual Task Severe: Stops counting while walking OR stops walking while counting.   15.14 sec normal TUG; 23.01 sec dual tasking TUG   Mini-BEST total score 14           PATIENT SURVEYS:  ABC scale: The Activities-Specific Balance Confidence (ABC) Scale 0% 10 20 30  40 50 60 70 80 90 100% No confidence<->completely confident  "How confident are you that you will not lose your balance or become unsteady when you . . .   Date tested 10/30/23  Walk around the house 90%  2. Walk up or down stairs 90%  3. Bend over  and pick up a slipper from in front of a closet floor 90%  4. Reach for a small can off a shelf at eye level 80%  5. Stand on tip toes and reach for something above your head 90%  6. Stand on a chair and reach for something 0%  7. Sweep the floor 100%  8. Walk outside the house to a car parked in the driveway 80%  9. Get into or out of a car 100%  10. Walk across a parking lot to the mall 100% w/ walker  11. Walk up or down a ramp 80% w/ walker  12. Walk in a crowded mall where people rapidly walk past you 80%  13. Are bumped into by people as you walk through the mall 10%  14. Step onto or off of an escalator while you are holding onto the railing 0%  15. Step onto or off an escalator while holding onto parcels such that you cannot hold onto the railing 0%  16. Walk outside on icy sidewalks 0%  Total: #/16 61.87%                                                                                                                                  TREATMENT DATE: 10/30/23 See HEP below    PATIENT EDUCATION: Education details: Exam findings, POC Person educated: Patient Education method: Explanation and Demonstration Education comprehension: verbalized understanding, returned demonstration, and needs further education  HOME EXERCISE PROGRAM: Did not yet initiate new HEP. Prior code from last PT episode is below Access Code: [email or web address] URL: [email or web address] Date: 10/30/2023 Prepared by: Shealyn Sean April Earnie Starring  Exercises - Sit to Stand with Arms Crossed  - 1 x daily - 7 x weekly - 2 sets - 5 reps - Heel Raises with Counter Support  - 1 x daily - 7 x weekly - 1 sets - 10 reps - Alternating Step Taps with Counter Support  - 1 x daily - 7 x weekly - 1 sets - 10 reps - Forward Step Over with Counter Support  - 1 x daily - 7 x weekly - 1 sets - 10 reps - Push-Up on Counter  - 1 x daily - 7 x weekly - 1 sets - 10 reps - Standing Bent Over Single Arm Scapular Row with Table Support with PLB  - 1 x daily - 7 x weekly - 1 sets - 10 reps - stomping  - 1 x daily - 7 x weekly - 1 sets - 10 reps - Forward Step Up with Counter Support  - 1 x daily - 7 x weekly - 1 sets - 10 reps - Seated Shoulder Row with Anchored Resistance  - 1 x daily - 7 x weekly - 2 sets - 10 reps - Standing Hip Abduction with Counter Support  - 1 x daily - 7 x weekly - 1 sets - 10 reps - Single Leg  Stance with Support  - 1 x daily - 7 x weekly - 1 sets - 5 reps - Lunge with Counter Support  - 1 x daily - 7 x weekly - 1 sets - 5 reps  GOALS: Goals reviewed with patient? Yes  SHORT TERM GOALS: Target date: 11/27/2023   Pt will be ind with initial HEP Baseline: Goal status: INITIAL  2.  Pt will have improved TUG to </=13 sec Baseline: 15.03 Goal status: INITIAL  3.  PT will obtain 6 min walk test baseline Baseline:  Goal status: INITIAL   LONG TERM GOALS: Target date: 12/25/2023   Pt will be  ind with management and progression of HEP Baseline:  Goal status: INITIAL  2.  Pt will have improved mini BESTest by 4 points to demo MCID Baseline: 14 Goal status: INITIAL  3.  Pt will demo at least 4/5 bilat LE strength for increased strength and stability Baseline:  Goal status: INITIAL  4.  Pt will have improved ABC score to >/=67% to be better than cut-off score for balance impairments Baseline: 61.87% Goal status: INITIAL  5.  Pt will have improved 6 min walk test by at least 160' to demo MCID for increased endurance Baseline:  Goal status: INITIAL    ASSESSMENT:  CLINICAL IMPRESSION: Patient is a 88 y.o. M who was seen today for physical therapy evaluation and treatment for falls. PMH is significant for multiple falls, CAD with prior CABG, and increased kyphosis. Assessment demos general weakness, abnormal posture, decreased endurance and high fall risk with history of multiple falls. Pt will benefit from PT to address these deficits to maximize his level of safety.    OBJECTIVE IMPAIRMENTS: Abnormal gait, decreased activity tolerance, decreased balance, decreased endurance, decreased mobility, difficulty walking, decreased strength, improper body mechanics, and postural dysfunction.   ACTIVITY LIMITATIONS: standing, squatting, stairs, transfers, and locomotion level  PARTICIPATION LIMITATIONS: cleaning, shopping, and community activity  PERSONAL FACTORS: Age, Fitness, Past/current experiences, and Time since onset of injury/illness/exacerbation are also affecting patient's functional outcome.   REHAB POTENTIAL: Good  CLINICAL DECISION MAKING: Evolving/moderate complexity  EVALUATION COMPLEXITY: Moderate  PLAN:  PT FREQUENCY: 2x/week  PT DURATION: 10 weeks  PLANNED INTERVENTIONS: 97164- PT Re-evaluation, 97750- Physical Performance Testing, 97110-Therapeutic exercises, 97530- Therapeutic activity, 97112- Neuromuscular re-education, 97535- Self Care, [id number]-  Manual therapy, [id number]- Gait training, [phone]- Aquatic Therapy, [phone]- Electrical stimulation (unattended), [id number]- Ultrasound, [id number]- Ionotophoresis 4mg /ml Dexamethasone , 79439 (1-2 muscles), [id number] (3+ muscles)- Dry Needling, Patient/Family education, Balance training, Stair training, Taping, Joint mobilization, Cryotherapy, and Moist heat  PLAN FOR NEXT SESSION: Assess 6 min walk test for endurance. Review HEP given from last PT episode. General strengthening and balance   Italia Wolfert April Ma L [place], PT, DPT 10/30/2023, 3:50 PM

## 2023-11-04 ENCOUNTER — Ambulatory Visit: Admitting: Rehabilitation

## 2023-11-04 ENCOUNTER — Encounter: Payer: Self-pay | Admitting: Rehabilitation

## 2023-11-04 DIAGNOSIS — M6281 Muscle weakness (generalized): Secondary | ICD-10-CM

## 2023-11-04 DIAGNOSIS — M4123 Other idiopathic scoliosis, cervicothoracic region: Secondary | ICD-10-CM

## 2023-11-04 DIAGNOSIS — R293 Abnormal posture: Secondary | ICD-10-CM

## 2023-11-04 DIAGNOSIS — R296 Repeated falls: Secondary | ICD-10-CM | POA: Diagnosis not present

## 2023-11-04 DIAGNOSIS — M542 Cervicalgia: Secondary | ICD-10-CM | POA: Diagnosis not present

## 2023-11-04 DIAGNOSIS — R2681 Unsteadiness on feet: Secondary | ICD-10-CM | POA: Diagnosis not present

## 2023-11-04 NOTE — Therapy (Signed)
 OUTPATIENT PHYSICAL THERAPY NEURO EVALUATION   Patient Name: Brent Griffith MRN: [id number] DOB:1935/09/03, 88 y.o., male Today's Date: 11/04/2023   PCP: [name], NP REFERRING PROVIDER: [name], NP  END OF SESSION:  PT End of Session - 11/04/23 1502     Visit Number 2    Number of Visits 16    Date for PT Re-Evaluation 12/25/23    Authorization Type [employer] Medicare    Authorization Time Period Auth requested 10/30/23    Progress Note Due on Visit 10    PT Start Time 1458          Past Medical History:  Diagnosis Date   Acute gastric ulcer without mention of hemorrhage, perforation, or obstruction    Allergic rhinitis, cause unspecified    Anemia, unspecified    Arthritis    Asthma    Blood transfusion without reported diagnosis    CAD (coronary artery disease)    Diaphragmatic hernia without mention of obstruction or gangrene    Elevated prostate specific antigen (PSA)    Enlarged prostate    Esophageal reflux    Extrinsic asthma, unspecified    Herpes zoster without mention of complication    Hyperlipidemia    Hypersomnia with sleep apnea, unspecified    Impotence of organic origin    Kyphosis    Lumbago    Obstructive sleep apnea (adult) (pediatric)    Pneumonia    hx of years ago    Senile osteoporosis    Trigger finger (acquired)    Tubular adenoma of colon 05/2013   Unspecified essential hypertension    Unspecified sleep apnea    CPAP- settings 4-12    Unspecified vitamin D deficiency    Past Surgical History:  Procedure Laterality Date   CARDIAC CATHETERIZATION  03/04/2009   Dr Kerrin   COLONOSCOPY     CORONARY ARTERY BYPASS GRAFT  2010   CYSTOSCOPY WITH RETROGRADE PYELOGRAM, URETEROSCOPY AND STENT PLACEMENT Right 10/04/2013   Procedure: CYSTOSCOPY WITH RETROGRADE PYELOGRAM,  AND STENT PLACEMENT;  Surgeon: Donnice Brooks, MD;  Location: [facility];  Service: Urology;  Laterality: Right;   DENTAL SURGERY N/A    MOLE REMOVAL   1958   chin   ROTATOR CUFF REPAIR Left 04/1998   with bone spur removed, Dr Shari   TONSILLECTOMY  1945   TRANSURETHRAL RESECTION OF PROSTATE N/A 09/24/2013   Procedure: TRANSURETHRAL RESECTION OF THE PROSTATE (TURP) WITH GYRUS (STAGED RIGHT LATERAL AND MEDIAN LOBE);  Surgeon: Arlena LILLETTE Gal, MD;  Location: [facility];  Service: Urology;  Laterality: N/A;   UPPER GI ENDOSCOPY     Patient Active Problem List   Diagnosis Date Noted   Vitamin D deficiency 09/19/2023   Recurrent falls 09/19/2023   CKD (chronic kidney disease) stage 3, GFR 30-59 ml/min (HCC) 07/06/2022   Atrial fibrillation (HCC) 10/25/2021   HFrEF (heart failure with reduced ejection fraction) (HCC) 01/19/2021   CAD (coronary artery disease) 10/11/2017   IDA (iron deficiency anemia) 08/31/2016   Seasonal and perennial allergic rhinitis 08/28/2016   Palpitations 09/07/2015   Cough, persistent 07/20/2015   Senile osteoporosis 06/09/2014   Hydronephrosis of right kidney 10/03/2013   Benign prostatic hyperplasia 09/24/2013   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   Dyslipidemia 04/06/2009   OSA (obstructive sleep apnea) 02/04/2009   Essential hypertension 09/02/2008   GERD (gastroesophageal reflux disease) 09/02/2008    ONSET DATE: Chronic  REFERRING DIAG: R29.6 (ICD-10-CM) - Recurrent falls  THERAPY DIAG:  Unsteadiness on  feet  Frequent falls  Muscle weakness (generalized)  Abnormal posture  Other idiopathic scoliosis, cervicothoracic region  Cervicalgia  Rationale for Evaluation and Treatment: Rehabilitation  SUBJECTIVE:                                                                                                                                                                                             SUBJECTIVE STATEMENT:  Pt. Reports feels ok.   Denies any falls.  Admits hasn't done his HEP since last visit.  States just haven't thought about it...  He is able to verbalize the importance of HEP compliance  and states will be more diligent on it.  States he does not use his walker at all times and usually walks his [place] with no assistive device.    Pt comes in due to falls. Has had history of falls for the last 3 years. Pt is on warfarin and goes to coumadin  clinic. Pt reports he normally falls forward. Has had PT in the past but has not been doing his exercises. Feels he has gotten weaker and not walking as far since stopping PT. Normally able to walk more around his neighborhood but currently only able to walk [place] 2-3 times. Recently got his rollator ~1 month ago Pt accompanied by: significant other, [name] (wife)  PERTINENT HISTORY: Wears thoracic/cervical brace at baseline. History of severe coronary artery disease with prior CABG. He has ischemic cardiomyopathy with a mildly reduced ejection fraction. He was diagnosed with atrial fibrillation in September 2022 and was started on amiodarone .   PAIN:  Are you having pain? No  PRECAUTIONS: Fall  RED FLAGS: None   WEIGHT BEARING RESTRICTIONS: No  FALLS: Has patient fallen in last 6 months? Yes. Number of falls at least 2 falls -- last fall on 08/23/23 slid off bed and hit head on his dresser  LIVING ENVIRONMENT: Lives with: lives with their spouse Lives in: House/apartment Stairs: 2 step ups to enter Has following equipment at home: Vannie - 4 wheeled  PLOF: Independent  PATIENT GOALS: Improve balance  OBJECTIVE:  Note: Objective measures were completed at Evaluation unless otherwise noted.  DIAGNOSTIC FINDINGS: CT cervical spine 08/23/23 IMPRESSION: 1. No acute traumatic injury identified in the cervical spine. 2. Chronic osteopenia, upper thoracic compression fractures with ankylosis and kyphosis.    COGNITION: Overall cognitive status: Within functional limits for tasks assessed   SENSATION: WFL  EDEMA:  None  MUSCLE TONE: Did not assess  DTRs:  Did not assess  POSTURE: Increased kyphosis.  Wears neck and thoracic brace  LOWER EXTREMITY ROM:  Active  Right Eval Left Eval  Hip flexion    Hip extension    Hip abduction    Hip adduction    Hip internal rotation    Hip external rotation    Knee flexion    Knee extension    Ankle dorsiflexion    Ankle plantarflexion    Ankle inversion    Ankle eversion     (Blank rows = not tested)  LOWER EXTREMITY MMT:    MMT Right Eval Left Eval  Hip flexion 5 4  Hip extension 4- 3+  Hip abduction 3 3  Hip adduction    Hip internal rotation    Hip external rotation    Knee flexion 5 5  Knee extension 5 4-  Ankle dorsiflexion    Ankle plantarflexion    Ankle inversion    Ankle eversion    (Blank rows = not tested)  BED MOBILITY:  SBA for safety  TRANSFERS: Sit to stand: Modified independence  Assistive device utilized: Environmental consultant - 4 wheeled     Stand to sit: Modified independence  Assistive device utilized: hands to bed      RAMP:  Not tested  CURB:  Not tested  STAIRS: Not tested GAIT: Findings: Distance walked: 180' and Comments: Decreased bilat step length, forward head  FUNCTIONAL TESTS:  5 times sit to stand: 13.22 sec using back of legs against bed Mini-BESTest: 14     PATIENT SURVEYS:  ABC scale: The Activities-Specific Balance Confidence (ABC) Scale 0% 10 20 30  40 50 60 70 80 90 100% No confidence<->completely confident  "How confident are you that you will not lose your balance or become unsteady when you . . .   Date tested 10/30/23  Walk around the house 90%  2. Walk up or down stairs 90%  3. Bend over and pick up a slipper from in front of a closet floor 90%  4. Reach for a small can off a shelf at eye level 80%  5. Stand on tip toes and reach for something above your head 90%  6. Stand on a chair and reach for something 0%  7. Sweep the floor 100%  8. Walk outside the house to a car parked in the driveway 80%  9. Get into or out of a car 100%  10. Walk across a parking lot to the  mall 100% w/ walker  11. Walk up or down a ramp 80% w/ walker  12. Walk in a crowded mall where people rapidly walk past you 80%  13. Are bumped into by people as you walk through the mall 10%  14. Step onto or off of an escalator while you are holding onto the railing 0%  15. Step onto or off an escalator while holding onto parcels such that you cannot hold onto the railing 0%  16. Walk outside on icy sidewalks 0%  Total: #/16 61.87%  TREATMENT DATE:  11/04/23  THERAPEUTIC EXERCISE: To improve strength, ROM, and flexibility.  Demonstration, verbal and tactile cues throughout for technique. UBE L1 4.5' Backward  THERAPEUTIC ACTIVITIES: To improve functional performance.  Demonstration, verbal and tactile cues throughout for technique.  Counter HEP review: Standing toe raises/heel raise rocking f/b Standing hip abd Sit to stand arms crossed 2/5 4 step ups x 10 BLE 4 step touch x 10 BLE Forward lunges x 10 BLE  NEUROMUSCULAR RE-EDUCATION: To improve balance, posture, and proprioception. SLS with min UE counter support x 10 sec x 5 BLE Step overs with QC x 10 f/b and s/s   Added: Standing back extension Standing step stance with wt shifting f/b x 10 BLE  10/30/23 See HEP below    PATIENT EDUCATION: Education details: Exam findings, POC Person educated: Patient Education method: Explanation and Demonstration Education comprehension: verbalized understanding, returned demonstration, and needs further education  HOME EXERCISE PROGRAM: Did not yet initiate new HEP. Prior code from last PT episode is below Access Code: [email or web address] URL: [email or web address] Date: 10/30/2023 Prepared by: Gellen April Earnie Starring  Exercises - Sit to Stand with Arms Crossed  - 1 x daily - 7 x weekly - 2 sets - 5 reps - Heel Raises with Counter Support  -  1 x daily - 7 x weekly - 1 sets - 10 reps - Alternating Step Taps with Counter Support  - 1 x daily - 7 x weekly - 1 sets - 10 reps - Forward Step Over with Counter Support  - 1 x daily - 7 x weekly - 1 sets - 10 reps - Push-Up on Counter  - 1 x daily - 7 x weekly - 1 sets - 10 reps - Standing Bent Over Single Arm Scapular Row with Table Support with PLB  - 1 x daily - 7 x weekly - 1 sets - 10 reps - stomping  - 1 x daily - 7 x weekly - 1 sets - 10 reps - Forward Step Up with Counter Support  - 1 x daily - 7 x weekly - 1 sets - 10 reps - Seated Shoulder Row with Anchored Resistance  - 1 x daily - 7 x weekly - 2 sets - 10 reps - Standing Hip Abduction with Counter Support  - 1 x daily - 7 x weekly - 1 sets - 10 reps - Single Leg Stance with Support  - 1 x daily - 7 x weekly - 1 sets - 5 reps - Lunge with Counter Support  - 1 x daily - 7 x weekly - 1 sets - 5 reps  GOALS: Goals reviewed with patient? Yes  SHORT TERM GOALS: Target date: 11/27/2023   Pt will be ind with initial HEP Baseline: Goal status: INITIAL  2.  Pt will have improved TUG to </=13 sec Baseline: 15.03 Goal status: INITIAL  3.  PT will obtain 6 min walk test baseline Baseline:  Goal status: INITIAL   LONG TERM GOALS: Target date: 12/25/2023   Pt will be ind with management and progression of HEP Baseline:  Goal status: INITIAL  2.  Pt will have improved mini BESTest by 4 points to demo MCID Baseline: 14 Goal status: INITIAL  3.  Pt will demo at least 4/5 bilat LE strength for increased strength and stability Baseline:  Goal status: INITIAL  4.  Pt will have improved ABC score to >/=67% to be better than cut-off score for balance impairments Baseline: 61.87% Goal status: INITIAL  5.  Pt will have improved 6 min walk test by at least 160' to demo MCID for increased endurance Baseline:  Goal status: INITIAL    ASSESSMENT:  CLINICAL IMPRESSION: Reviewed HEP and patient requires manual assistance and  v/c for correct completion of all exercises.  He is encouraged to be diligent about doing his HEP daily.  He participates well with PT and gives excellent effort.  The severity of his kyphosis keeps his COG at or even behind his heels at times.   He should continue to use his walker at all times, and especially when out of the home, walking the dog, etc. And this is reiterated to the patient today.     Patient is a 88 y.o. M who was seen today for physical therapy evaluation and treatment for falls. PMH is significant for multiple falls, CAD with prior CABG, and increased kyphosis. Assessment demos general weakness, abnormal posture, decreased endurance and high fall risk with history of multiple falls. Pt will benefit from PT to address these deficits to maximize his level of safety.    OBJECTIVE IMPAIRMENTS: Abnormal gait, decreased activity tolerance, decreased balance, decreased endurance, decreased mobility, difficulty walking, decreased strength, improper body mechanics, and postural dysfunction.   ACTIVITY LIMITATIONS: standing, squatting, stairs, transfers, and locomotion level  PARTICIPATION LIMITATIONS: cleaning, shopping, and community activity  PERSONAL FACTORS: Age, Fitness, Past/current experiences, and Time since onset of injury/illness/exacerbation are also affecting patient's functional outcome.   REHAB POTENTIAL: Good  CLINICAL DECISION MAKING: Evolving/moderate complexity  EVALUATION COMPLEXITY: Moderate  PLAN:  PT FREQUENCY: 2x/week  PT DURATION: 10 weeks  PLANNED INTERVENTIONS: 97164- PT Re-evaluation, 97750- Physical Performance Testing, 97110-Therapeutic exercises, 97530- Therapeutic activity, [id number]- Neuromuscular re-education, 97535- Self Care, [id number]- Manual therapy, [id number]- Gait training, [phone]- Aquatic Therapy, [phone]- Electrical stimulation (unattended), [id number]- Ultrasound, [id number]- Ionotophoresis 4mg /ml Dexamethasone , 79439 (1-2 muscles), 20561 (3+ muscles)- Dry  Needling, Patient/Family education, Balance training, Stair training, Taping, Joint mobilization, Cryotherapy, and Moist heat  PLAN FOR NEXT SESSION: Assess 6 min walk test for endurance.  Continue to work on strength, balance with more dynamic balance activities.   Juanita Devincent, PT, DPT 11/04/2023, 3:05 PM

## 2023-11-12 ENCOUNTER — Ambulatory Visit: Attending: Nurse Practitioner | Admitting: Rehabilitation

## 2023-11-12 ENCOUNTER — Encounter: Payer: Self-pay | Admitting: Rehabilitation

## 2023-11-12 DIAGNOSIS — M4123 Other idiopathic scoliosis, cervicothoracic region: Secondary | ICD-10-CM | POA: Insufficient documentation

## 2023-11-12 DIAGNOSIS — R293 Abnormal posture: Secondary | ICD-10-CM | POA: Diagnosis not present

## 2023-11-12 DIAGNOSIS — M6281 Muscle weakness (generalized): Secondary | ICD-10-CM | POA: Insufficient documentation

## 2023-11-12 DIAGNOSIS — M542 Cervicalgia: Secondary | ICD-10-CM | POA: Diagnosis not present

## 2023-11-12 DIAGNOSIS — R296 Repeated falls: Secondary | ICD-10-CM | POA: Diagnosis not present

## 2023-11-12 DIAGNOSIS — R2681 Unsteadiness on feet: Secondary | ICD-10-CM | POA: Insufficient documentation

## 2023-11-12 NOTE — Therapy (Signed)
 OUTPATIENT PHYSICAL THERAPY NEURO EVALUATION   Patient Name: LADALE SHERBURN MRN: [id number] DOB:Apr 06, 1936, 88 y.o., male Today's Date: 11/12/2023   PCP: [name], NP REFERRING PROVIDER: [name], NP  END OF SESSION:    Past Medical History:  Diagnosis Date   Acute gastric ulcer without mention of hemorrhage, perforation, or obstruction    Allergic rhinitis, cause unspecified    Anemia, unspecified    Arthritis    Asthma    Blood transfusion without reported diagnosis    CAD (coronary artery disease)    Diaphragmatic hernia without mention of obstruction or gangrene    Elevated prostate specific antigen (PSA)    Enlarged prostate    Esophageal reflux    Extrinsic asthma, unspecified    Herpes zoster without mention of complication    Hyperlipidemia    Hypersomnia with sleep apnea, unspecified    Impotence of organic origin    Kyphosis    Lumbago    Obstructive sleep apnea (adult) (pediatric)    Pneumonia    hx of years ago    Senile osteoporosis    Trigger finger (acquired)    Tubular adenoma of colon 05/2013   Unspecified essential hypertension    Unspecified sleep apnea    CPAP- settings 4-12    Unspecified vitamin D deficiency    Past Surgical History:  Procedure Laterality Date   CARDIAC CATHETERIZATION  03/04/2009   Dr Kerrin   COLONOSCOPY     CORONARY ARTERY BYPASS GRAFT  2010   CYSTOSCOPY WITH RETROGRADE PYELOGRAM, URETEROSCOPY AND STENT PLACEMENT Right 10/04/2013   Procedure: CYSTOSCOPY WITH RETROGRADE PYELOGRAM,  AND STENT PLACEMENT;  Surgeon: Donnice Brooks, MD;  Location: [facility];  Service: Urology;  Laterality: Right;   DENTAL SURGERY N/A    MOLE REMOVAL  1958   chin   ROTATOR CUFF REPAIR Left 04/1998   with bone spur removed, Dr Shari   TONSILLECTOMY  1945   TRANSURETHRAL RESECTION OF PROSTATE N/A 09/24/2013   Procedure: TRANSURETHRAL RESECTION OF THE PROSTATE (TURP) WITH GYRUS (STAGED RIGHT LATERAL AND MEDIAN LOBE);   Surgeon: Arlena LILLETTE Gal, MD;  Location: [facility];  Service: Urology;  Laterality: N/A;   UPPER GI ENDOSCOPY     Patient Active Problem List   Diagnosis Date Noted   Vitamin D deficiency 09/19/2023   Recurrent falls 09/19/2023   CKD (chronic kidney disease) stage 3, GFR 30-59 ml/min (HCC) 07/06/2022   Atrial fibrillation (HCC) 10/25/2021   HFrEF (heart failure with reduced ejection fraction) (HCC) 01/19/2021   CAD (coronary artery disease) 10/11/2017   IDA (iron deficiency anemia) 08/31/2016   Seasonal and perennial allergic rhinitis 08/28/2016   Palpitations 09/07/2015   Cough, persistent 07/20/2015   Senile osteoporosis 06/09/2014   Hydronephrosis of right kidney 10/03/2013   Benign prostatic hyperplasia 09/24/2013   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   Dyslipidemia 04/06/2009   OSA (obstructive sleep apnea) 02/04/2009   Essential hypertension 09/02/2008   GERD (gastroesophageal reflux disease) 09/02/2008    ONSET DATE: Chronic  REFERRING DIAG: R29.6 (ICD-10-CM) - Recurrent falls  THERAPY DIAG:  No diagnosis found.  Rationale for Evaluation and Treatment: Rehabilitation  SUBJECTIVE:  SUBJECTIVE STATEMENT:  Patient reports doing his HEP better than he was.   HE denies any falls.  States his L shoulder is a little painful today posteriorly.    Pt comes in due to falls. Has had history of falls for the last 3 years. Pt is on warfarin and goes to coumadin  clinic. Pt reports he normally falls forward. Has had PT in the past but has not been doing his exercises. Feels he has gotten weaker and not walking as far since stopping PT. Normally able to walk more around his neighborhood but currently only able to walk [place] 2-3 times. Recently got his rollator ~1 month ago Pt accompanied by:  significant other, [name] (wife)  PERTINENT HISTORY: Wears thoracic/cervical brace at baseline. History of severe coronary artery disease with prior CABG. He has ischemic cardiomyopathy with a mildly reduced ejection fraction. He was diagnosed with atrial fibrillation in September 2022 and was started on amiodarone .   PAIN:  Are you having pain? No  PRECAUTIONS: Fall  RED FLAGS: None   WEIGHT BEARING RESTRICTIONS: No  FALLS: Has patient fallen in last 6 months? Yes. Number of falls at least 2 falls -- last fall on 08/23/23 slid off bed and hit head on his dresser  LIVING ENVIRONMENT: Lives with: lives with their spouse Lives in: House/apartment Stairs: 2 step ups to enter Has following equipment at home: Vannie - 4 wheeled  PLOF: Independent  PATIENT GOALS: Improve balance  OBJECTIVE:  Note: Objective measures were completed at Evaluation unless otherwise noted.  DIAGNOSTIC FINDINGS: CT cervical spine 08/23/23 IMPRESSION: 1. No acute traumatic injury identified in the cervical spine. 2. Chronic osteopenia, upper thoracic compression fractures with ankylosis and kyphosis.    COGNITION: Overall cognitive status: Within functional limits for tasks assessed   SENSATION: WFL  EDEMA:  None  MUSCLE TONE: Did not assess  DTRs:  Did not assess  POSTURE: Increased kyphosis. Wears neck and thoracic brace  LOWER EXTREMITY ROM:     Active  Right Eval Left Eval  Hip flexion    Hip extension    Hip abduction    Hip adduction    Hip internal rotation    Hip external rotation    Knee flexion    Knee extension    Ankle dorsiflexion    Ankle plantarflexion    Ankle inversion    Ankle eversion     (Blank rows = not tested)  LOWER EXTREMITY MMT:    MMT Right Eval Left Eval  Hip flexion 5 4  Hip extension 4- 3+  Hip abduction 3 3  Hip adduction    Hip internal rotation    Hip external rotation    Knee flexion 5 5  Knee extension 5 4-  Ankle dorsiflexion     Ankle plantarflexion    Ankle inversion    Ankle eversion    (Blank rows = not tested)  BED MOBILITY:  SBA for safety  TRANSFERS: Sit to stand: Modified independence  Assistive device utilized: Environmental consultant - 4 wheeled     Stand to sit: Modified independence  Assistive device utilized: hands to bed      RAMP:  Not tested  CURB:  Not tested  STAIRS: Not tested GAIT: Findings: Distance walked: 180' and Comments: Decreased bilat step length, forward head  FUNCTIONAL TESTS:  5 times sit to stand: 13.22 sec using back of legs against bed Mini-BESTest: 14     PATIENT SURVEYS:  ABC scale: The  Activities-Specific Balance Confidence (ABC) Scale 0% 10 20 30  40 50 60 70 80 90 100% No confidence<->completely confident  "How confident are you that you will not lose your balance or become unsteady when you . . .   Date tested 10/30/23  Walk around the house 90%  2. Walk up or down stairs 90%  3. Bend over and pick up a slipper from in front of a closet floor 90%  4. Reach for a small can off a shelf at eye level 80%  5. Stand on tip toes and reach for something above your head 90%  6. Stand on a chair and reach for something 0%  7. Sweep the floor 100%  8. Walk outside the house to a car parked in the driveway 80%  9. Get into or out of a car 100%  10. Walk across a parking lot to the mall 100% w/ walker  11. Walk up or down a ramp 80% w/ walker  12. Walk in a crowded mall where people rapidly walk past you 80%  13. Are bumped into by people as you walk through the mall 10%  14. Step onto or off of an escalator while you are holding onto the railing 0%  15. Step onto or off an escalator while holding onto parcels such that you cannot hold onto the railing 0%  16. Walk outside on icy sidewalks 0%  Total: #/16 61.87%                                                                                                                                 TREATMENT DATE:  11/12/23 NuStep L5 x  5' Counter back extension x 2/10 Doorway pec stretch x 1' x 2 BUE Standing Toe/heel rocking x 20 BLE Heel walking and toe walking at counter x 3 laps STS arms crossed x 2/5 Counter lunges x 10 BLE  QC step overs 2/10 F/B AND S/S  11/04/23  THERAPEUTIC EXERCISE: To improve strength, ROM, and flexibility.  Demonstration, verbal and tactile cues throughout for technique. UBE L1 4.5' Backward  THERAPEUTIC ACTIVITIES: To improve functional performance.  Demonstration, verbal and tactile cues throughout for technique.  Counter HEP review: Standing toe raises/heel raise rocking f/b Standing hip abd Sit to stand arms crossed 2/5 4 step ups x 10 BLE 4 step touch x 10 BLE Forward lunges x 10 BLE    NEUROMUSCULAR RE-EDUCATION: To improve balance, posture, and proprioception. SLS with min UE counter support x 10 sec x 5 BLE Step overs with QC x 10 f/b and s/s   Added: Standing back extension Standing step stance with wt shifting f/b x 10 BLE  10/30/23 See HEP below    PATIENT EDUCATION: Education details: Exam findings, POC Person educated: Patient Education method: Explanation and Demonstration Education comprehension: verbalized understanding, returned demonstration, and needs further education  HOME EXERCISE PROGRAM: Did not yet initiate new HEP. Prior code from  last PT episode is below Access Code: [email or web address] URL: [email or web address] Date: 10/30/2023 Prepared by: Gellen April Earnie Starring  Exercises - Sit to Stand with Arms Crossed  - 1 x daily - 7 x weekly - 2 sets - 5 reps - Heel Raises with Counter Support  - 1 x daily - 7 x weekly - 1 sets - 10 reps - Alternating Step Taps with Counter Support  - 1 x daily - 7 x weekly - 1 sets - 10 reps - Forward Step Over with Counter Support  - 1 x daily - 7 x weekly - 1 sets - 10 reps - Push-Up on Counter  - 1 x daily - 7 x weekly - 1 sets - 10 reps - Standing Bent Over Single Arm Scapular Row with Table Support  with PLB  - 1 x daily - 7 x weekly - 1 sets - 10 reps - stomping  - 1 x daily - 7 x weekly - 1 sets - 10 reps - Forward Step Up with Counter Support  - 1 x daily - 7 x weekly - 1 sets - 10 reps - Seated Shoulder Row with Anchored Resistance  - 1 x daily - 7 x weekly - 2 sets - 10 reps - Standing Hip Abduction with Counter Support  - 1 x daily - 7 x weekly - 1 sets - 10 reps - Single Leg Stance with Support  - 1 x daily - 7 x weekly - 1 sets - 5 reps - Lunge with Counter Support  - 1 x daily - 7 x weekly - 1 sets - 5 reps  GOALS: Goals reviewed with patient? Yes  SHORT TERM GOALS: Target date: 11/27/2023   Pt will be ind with initial HEP Baseline: Goal status: INITIAL  2.  Pt will have improved TUG to </=13 sec Baseline: 15.03 Goal status: INITIAL  3.  PT will obtain 6 min walk test baseline Baseline:  Goal status: INITIAL   LONG TERM GOALS: Target date: 12/25/2023   Pt will be ind with management and progression of HEP Baseline:  Goal status: INITIAL  2.  Pt will have improved mini BESTest by 4 points to demo MCID Baseline: 14 Goal status: INITIAL  3.  Pt will demo at least 4/5 bilat LE strength for increased strength and stability Baseline:  Goal status: INITIAL  4.  Pt will have improved ABC score to >/=67% to be better than cut-off score for balance impairments Baseline: 61.87% Goal status: INITIAL  5.  Pt will have improved 6 min walk test by at least 160' to demo MCID for increased endurance Baseline:  Goal status: INITIAL    ASSESSMENT:  CLINICAL IMPRESSION: Reviewed HEP and patient requires manual assistance and v/c for correct completion of all exercises.  He is encouraged to be diligent about doing his HEP daily.  He participates well with PT and gives excellent effort.  The severity of his kyphosis keeps his COG at or even behind his heels at times.   He should continue to use his walker at all times, and especially when out of the home, walking the dog,  etc. And this is reiterated to the patient today.     Patient is a 88 y.o. M who was seen today for physical therapy evaluation and treatment for falls. PMH is significant for multiple falls, CAD with prior CABG, and increased kyphosis. Assessment demos general weakness, abnormal posture, decreased endurance and high fall risk with history of multiple  falls. Pt will benefit from PT to address these deficits to maximize his level of safety.    OBJECTIVE IMPAIRMENTS: Abnormal gait, decreased activity tolerance, decreased balance, decreased endurance, decreased mobility, difficulty walking, decreased strength, improper body mechanics, and postural dysfunction.   ACTIVITY LIMITATIONS: standing, squatting, stairs, transfers, and locomotion level  PARTICIPATION LIMITATIONS: cleaning, shopping, and community activity  PERSONAL FACTORS: Age, Fitness, Past/current experiences, and Time since onset of injury/illness/exacerbation are also affecting patient's functional outcome.   REHAB POTENTIAL: Good  CLINICAL DECISION MAKING: Evolving/moderate complexity  EVALUATION COMPLEXITY: Moderate  PLAN:  PT FREQUENCY: 2x/week  PT DURATION: 10 weeks  PLANNED INTERVENTIONS: 97164- PT Re-evaluation, 97750- Physical Performance Testing, 97110-Therapeutic exercises, 97530- Therapeutic activity, 97112- Neuromuscular re-education, 97535- Self Care, [id number]- Manual therapy, [id number]- Gait training, [phone]- Aquatic Therapy, [phone]- Electrical stimulation (unattended), [id number]- Ultrasound, [id number]- Ionotophoresis 4mg /ml Dexamethasone , 79439 (1-2 muscles), [id number] (3+ muscles)- Dry Needling, Patient/Family education, Balance training, Stair training, Taping, Joint mobilization, Cryotherapy, and Moist heat  PLAN FOR NEXT SESSION: Assess 6 min walk test for endurance.  Continue to work on strength, balance with more dynamic balance activities.   Woodie Trusty, PT, DPT 11/12/2023, 2:11 PM

## 2023-11-14 ENCOUNTER — Ambulatory Visit: Attending: Cardiology | Admitting: *Deleted

## 2023-11-14 DIAGNOSIS — I4891 Unspecified atrial fibrillation: Secondary | ICD-10-CM | POA: Diagnosis not present

## 2023-11-14 DIAGNOSIS — Z5181 Encounter for therapeutic drug level monitoring: Secondary | ICD-10-CM

## 2023-11-14 LAB — POCT INR: POC INR: 3.2

## 2023-11-14 NOTE — Progress Notes (Signed)
Please see anticoagulation encounter.

## 2023-11-14 NOTE — Patient Instructions (Signed)
 Description   Hold warfarin tomorrow then continue taking 1/2 tablet daily, EXCEPT 1 tablet on Mondays and Fridays.  Stay consistent with boost/ensure (Mon, Wed, and Fri)  Recheck INR in 3 weeks. Coumadin  Clinic [phone]

## 2023-11-15 ENCOUNTER — Ambulatory Visit: Payer: Medicare Other | Admitting: Adult Health

## 2023-11-15 ENCOUNTER — Other Ambulatory Visit (HOSPITAL_BASED_OUTPATIENT_CLINIC_OR_DEPARTMENT_OTHER): Payer: Self-pay | Admitting: Cardiology

## 2023-11-15 VITALS — BP 140/78 | HR 57 | Temp 97.4°F | Resp 18 | Ht 62.0 in | Wt 127.6 lb

## 2023-11-15 DIAGNOSIS — Z0000 Encounter for general adult medical examination without abnormal findings: Secondary | ICD-10-CM

## 2023-11-15 NOTE — Patient Instructions (Signed)
 Brent Griffith , Thank you for taking time to come for your Medicare Wellness Visit. I appreciate your ongoing commitment to your health goals. Please review the following plan we discussed and let me know if I can assist you in the future.   These are the goals we discussed:  Goals      BSW VBCI Social Work Care Plan     Problems:   Corporate treasurer  and Wants Traditional Medicaid2  CSW Clinical Goal(s):   Over the next 2 weeks the Patient will will follow up with referral with the walk in [facility] as directed by Social Work.  Interventions:  SW will make the referral to the walk in Medicaid clinic  Patient Goals/Self-Care Activities:  Coordinate with walk in [facility] to assist with for Medicaid.  Plan:   Telephone follow up appointment with care management team member scheduled for:  10/29/2023 at 2:00 pm     Exercise 3x per week (30 min per time)     I will like to exercise at least 2-3 times per week at the [facility] or [facility]      Exercise 3x per week (30 min per time)     - currently having outpatient PT 2X/week -  turn off tv and keep moving instead of sitting down.     VBCI RN Care Plan related to Atrial Fibrillation     Problems:  Chronic Disease Management support and education needs related to Atrial Fibrillation Financial Constraints.  Goal: Over the next 90 days the Patient will continue to work with RN Care Manager and/or Social Worker to address care management and care coordination needs related to Atrial Fibrillation as evidenced by adherence to care management team scheduled appointments     verbalize understanding of plan for management of Atrial Fibrillation as evidenced by patient will have a good understanding of the treatment plan related to Watchman Placement per Dr. Cindie, Cardiologist   Interventions:   AFIB Interventions: Counseled on increased risk of stroke due to Afib and benefits of anticoagulation for stroke prevention Reviewed  importance of adherence to anticoagulant exactly as prescribed Counseled on importance of regular laboratory monitoring as prescribed Counseled on seeking medical attention after a head injury or if there is blood in the urine/stool Assessed social determinant of health barriers  Patient Self-Care Activities:  Attend all scheduled provider appointments Call pharmacy for medication refills 3-7 days in advance of running out of medications Call provider office for new concerns or questions  Take medications as prescribed   Work with the nurse care manager to address care coordination needs and will continue to work with the clinical team to address health care and disease management related needs bring symptom diary to all appointments keep all lab appointments take medicine as prescribed  Plan:  Follow up with provider re: Watchman Placement evaluation with Dr. Cindie on 09/26/23 at 11:00 AM Telephone follow up appointment with care management team member scheduled for:  Thursday, June 12 at 1:00 PM      VBCI RN Care Plan related to High Risk for Injury secondary to Falls/anticoagulant use     Problems:  Chronic Disease Management support and education needs related to history of recurrent Biochemist, clinical. Home Safety Concerns Improper DME (patient needs a rollator)  Goal: Over the next 90 days the Patient will continue to work with RN Care Manager and/or Social Worker to address care management and care coordination needs related to history of recurrent Falls  as evidenced by adherence to care management team scheduled appointments      Interventions:   Falls Interventions: Provided written and verbal education re: potential causes of falls and Fall prevention strategies Advised patient of importance of notifying provider of falls Assessed for falls since last encounter Assessed patients knowledge of fall risk prevention secondary to previously provided  education Reviewed PCP referral for PT, reviewed referral status with patient and educated patient regarding next steps Assessed for DME needs, sent in basket message to PCP provider requesting an Rx for rollator to be sent to [facility]  Assessed for home safety concerns, referral sent per care guide to Aging Gracefully   Patient Self-Care Activities:  Attend all scheduled provider appointments Call pharmacy for medication refills 3-7 days in advance of running out of medications Call provider office for new concerns or questions  Take medications as prescribed   Work with PT for strengthening, endurance and improved mobility/gait Use your rollator once received from DME supplier to help reduce falls   Plan:  Contact outpatient PT provider to schedule your PT Telephone follow up appointment with care management team member scheduled for:  Thursday, June 12 at1:00 PM             This is a list of the screening recommended for you and due dates:  Health Maintenance  Topic Date Due   DTaP/Tdap/Td vaccine (3 - Td or Tdap) 07/24/2021   COVID-19 Vaccine (7 - Moderna risk 2024-25 season) 12/06/2023*   Flu Shot  12/06/2023   Medicare Annual Wellness Visit  11/14/2024   Pneumococcal Vaccine for age over 69  Completed   Zoster (Shingles) Vaccine  Completed   Hepatitis B Vaccine  Aged Out   HPV Vaccine  Aged Out   Meningitis B Vaccine  Aged Out   Colon Cancer Screening  Discontinued  *Topic was postponed. The date shown is not the original due date.

## 2023-11-15 NOTE — Progress Notes (Signed)
 Subjective:   Brent Griffith is a 88 y.o. male who presents for Medicare Annual/Subsequent preventive examination.  Visit Complete: In person  Patient Medicare AWV questionnaire was completed by the patient on 11/15/23; I have confirmed that all information answered by patient is correct and no changes since this date.  Cardiac Risk Factors include: advanced age (>26men, >48 women);dyslipidemia;family history of premature cardiovascular disease;male gender;sedentary lifestyle     Objective:    Today's Vitals   11/15/23 1412 11/15/23 1450  BP: (!) 140/78   Pulse: (!) 57   Resp: 18   Temp: (!) 97.4 F (36.3 C)   SpO2: 95%   Weight: 127 lb 9.6 oz (57.9 kg)   Height: 5' 2 (1.575 m)   PainSc: 0-No pain 0-No pain   Body mass index is 23.34 kg/m.     11/15/2023    2:16 PM 10/30/2023    2:11 PM 08/23/2023    2:49 AM 08/22/2023    3:49 PM 05/27/2023    8:57 AM 05/15/2023    5:17 PM 12/27/2022    7:44 PM  Advanced Directives  Does Patient Have a Medical Advance Directive? No No No No No No No  Would patient like information on creating a medical advance directive?  No - Patient declined No - Patient declined  Yes (MAU/Ambulatory/Procedural Areas - Information given)      Current Medications (verified) Outpatient Encounter Medications as of 11/15/2023  Medication Sig   albuterol  (VENTOLIN  HFA) 108 (90 Base) MCG/ACT inhaler Inhale 1-2 puffs into the lungs every 6 (six) hours as needed for wheezing or shortness of breath.   alum & mag hydroxide-simeth (MAALOX MAX) 400-400-40 MG/5ML suspension Take 10 mLs by mouth every 6 (six) hours as needed (pain with swallowing and reflux).   amiodarone  (PACERONE ) 200 MG tablet Take 1 tablet (200 mg total) by mouth daily.   Ascorbic Acid (VITAMIN C) 1000 MG tablet Take 1,000 mg by mouth daily.   Calcium  Citrate-Vitamin D (EQ CALCIUM  CITRATE+D3 PO) Take 1,200 mg by mouth daily.   carvedilol  (COREG ) 3.125 MG tablet TAKE 1 TABLET BY MOUTH TWICE  DAILY    Coenzyme Q10 (COQ10) 200 MG CAPS Take 200 mg by mouth daily.   doxazosin  (CARDURA ) 2 MG tablet TAKE 1 TABLET BY MOUTH DAILY   empagliflozin  (JARDIANCE ) 10 MG TABS tablet Take 1 tablet (10 mg total) by mouth daily before breakfast.   famotidine  (PEPCID ) 20 MG tablet TAKE 1 TABLET BY MOUTH TWICE  DAILY   finasteride  (PROSCAR ) 5 MG tablet TAKE 1 TABLET BY MOUTH IN  THE MORNING   fluticasone  (FLONASE ) 50 MCG/ACT nasal spray Place 1 spray into both nostrils daily.   furosemide  (LASIX ) 20 MG tablet TAKE 1 TABLET BY MOUTH DAILY AS  NEEDED FOR LEG SWELLING   Iron, Ferrous Sulfate, 325 (65 Fe) MG TABS Take 325 mg by mouth 2 (two) times daily.   lactose free nutrition (BOOST) LIQD Take 237 mLs by mouth every Monday, Wednesday, and Friday.   losartan  (COZAAR ) 25 MG tablet TAKE 1 TABLET BY MOUTH DAILY  PLEASE KEEP SCHEDULED  APPOINTMENT WITH CARDIOLOGIST   mexiletine (MEXITIL) 150 MG capsule Take 1 capsule (150 mg total) by mouth 2 (two) times daily.   pantoprazole  (PROTONIX ) 40 MG tablet TAKE 1 TABLET BY MOUTH DAILY   rosuvastatin  (CRESTOR ) 20 MG tablet TAKE 1 TABLET BY MOUTH ONCE  DAILY FOR CHOLESTEROL   warfarin (COUMADIN ) 2.5 MG tablet TAKE 1/2 TO 1 TABLET BY MOUTH  DAILY  AS DIRECTED BY THE  COUMADIN  CLINIC   No facility-administered encounter medications on file as of 11/15/2023.    Allergies (verified) Compazine [prochlorperazine edisylate]   History: Past Medical History:  Diagnosis Date   Acute gastric ulcer without mention of hemorrhage, perforation, or obstruction    Allergic rhinitis, cause unspecified    Anemia, unspecified    Arthritis    Asthma    Blood transfusion without reported diagnosis    CAD (coronary artery disease)    Diaphragmatic hernia without mention of obstruction or gangrene    Elevated prostate specific antigen (PSA)    Enlarged prostate    Esophageal reflux    Extrinsic asthma, unspecified    Herpes zoster without mention of complication    Hyperlipidemia     Hypersomnia with sleep apnea, unspecified    Impotence of organic origin    Kyphosis    Lumbago    Obstructive sleep apnea (adult) (pediatric)    Pneumonia    hx of years ago    Senile osteoporosis    Trigger finger (acquired)    Tubular adenoma of colon 05/2013   Unspecified essential hypertension    Unspecified sleep apnea    CPAP- settings 4-12    Unspecified vitamin D deficiency    Past Surgical History:  Procedure Laterality Date   CARDIAC CATHETERIZATION  03/04/2009   Dr Kerrin   COLONOSCOPY     CORONARY ARTERY BYPASS GRAFT  2010   CYSTOSCOPY WITH RETROGRADE PYELOGRAM, URETEROSCOPY AND STENT PLACEMENT Right 10/04/2013   Procedure: CYSTOSCOPY WITH RETROGRADE PYELOGRAM,  AND STENT PLACEMENT;  Surgeon: Donnice Brooks, MD;  Location: [facility];  Service: Urology;  Laterality: Right;   DENTAL SURGERY N/A    MOLE REMOVAL  1958   chin   ROTATOR CUFF REPAIR Left 04/1998   with bone spur removed, Dr Shari   TONSILLECTOMY  1945   TRANSURETHRAL RESECTION OF PROSTATE N/A 09/24/2013   Procedure: TRANSURETHRAL RESECTION OF THE PROSTATE (TURP) WITH GYRUS (STAGED RIGHT LATERAL AND MEDIAN LOBE);  Surgeon: Arlena LILLETTE Gal, MD;  Location: [facility];  Service: Urology;  Laterality: N/A;   UPPER GI ENDOSCOPY     Family History  Problem Relation Age of Onset   Stroke Mother    Breast cancer Mother    Cirrhosis Mother        wine   Rheumatic fever Mother    Kyphosis Mother    Hypertension Father    Stroke Father    Heart disease Father    Heart disease Paternal Grandfather    Colon cancer Neg Hx    Social History   Socioeconomic History   Marital status: Married    Spouse name: Not on file   Number of children: 0   Years of education: Not on file   Highest education level: Not on file  Occupational History   Occupation: Network engineer  Tobacco Use   Smoking status: Never    Passive exposure: Never   Smokeless tobacco: Never  Vaping Use   Vaping status:  Never Used  Substance and Sexual Activity   Alcohol use: No   Drug use: No   Sexual activity: Not Currently  Other Topics Concern   Not on file  Social History Narrative   Not on file   Social Drivers of Health   Financial Resource Strain: High Risk (10/15/2023)   Overall Financial Resource Strain (CARDIA)    Difficulty of Paying Living Expenses: Very hard  Food Insecurity: No Food  Insecurity (10/15/2023)   Hunger Vital Sign    Worried About Running Out of Food in the Last Year: Never true    Ran Out of Food in the Last Year: Never true  Recent Concern: Food Insecurity - Food Insecurity Present (09/18/2023)   Hunger Vital Sign    Worried About Running Out of Food in the Last Year: Sometimes true    Ran Out of Food in the Last Year: Sometimes true  Transportation Needs: No Transportation Needs (10/15/2023)   PRAPARE - Administrator, Civil Service (Medical): No    Lack of Transportation (Non-Medical): No  Physical Activity: Insufficiently Active (09/18/2023)   Exercise Vital Sign    Days of Exercise per Week: 2 days    Minutes of Exercise per Session: 30 min  Stress: No Stress Concern Present (09/18/2023)   [employer] of Occupational Health - Occupational Stress Questionnaire    Feeling of Stress : Only a little  Social Connections: Moderately Integrated (09/18/2023)   Social Connection and Isolation Panel    Frequency of Communication with Friends and Family: Once a week    Frequency of Social Gatherings with Friends and Family: Never    Attends Religious Services: More than 4 times per year    Active Member of [employer] or Organizations: Yes    Attends Engineer, structural: More than 4 times per year    Marital Status: Married    Tobacco Counseling Counseling given: Not Answered   Clinical Intake:  Pre-visit preparation completed: No  Pain : No/denies pain Pain Score: 0-No pain     BMI - recorded: 23.37 Nutritional Status: BMI of 19-24   Normal Nutritional Risks: None Diabetes: No  How often do you need to have someone help you when you read instructions, pamphlets, or other written materials from your doctor or pharmacy?: 2 - Rarely What is the last grade level you completed in school?: 1st year college  Interpreter Needed?: No  Information entered by :: Able Malloy Medina-Vargas DNP   Activities of Daily Living    11/15/2023    2:55 PM  In your present state of health, do you have any difficulty performing the following activities:  Hearing? 0  Vision? 0  Difficulty concentrating or making decisions? 1  Comment forgetfull  Walking or climbing stairs? 0  Dressing or bathing? 0  Doing errands, shopping? 0  Preparing Food and eating ? N  Using the Toilet? N  In the past six months, have you accidently leaked urine? Y  Do you have problems with loss of bowel control? N  Managing your Medications? N  Managing your Finances? N  Housekeeping or managing your Housekeeping? Y    Patient Care Team: [name], NP as PCP - General (Geriatric Medicine) [name], MD as PCP - Cardiology (Cardiology) [name], MD as PCP - Electrophysiology (Cardiology) [name], MD as Consulting Physician (Sports Medicine) [name], MD as Referring Physician (Ophthalmology) Morgan Clayborne CROME, RN as [facility] Care Management Maranda Lister D as Social Worker  Indicate any recent Medical Services you may have received from other than [facility] providers in the past year (date may be approximate).     Assessment:   This is a routine wellness examination for Brent Griffith.  Hearing/Vision screen Hearing Screening - Comments:: No problem hearing Vision Screening - Comments:: No problem with vision   Goals Addressed             This Visit's Progress  Exercise 3x per week (30 min per time)       - currently having outpatient PT 2X/week -  turn off tv and keep moving instead of sitting down.        Depression Screen    11/15/2023    2:17 PM 09/18/2023    1:37 PM 09/18/2023    1:34 PM 09/16/2023    2:22 PM 11/16/2022    3:24 PM 11/12/2022    2:16 PM 03/16/2022    2:34 PM  PHQ 2/9 Scores  PHQ - 2 Score 0 0 0 0 0 0 0  PHQ- 9 Score     0    Exception Documentation      Other- indicate reason in comment box   Not completed      AWV     Fall Risk    11/15/2023    2:17 PM 09/18/2023    1:38 PM 09/16/2023    2:22 PM 05/27/2023    2:25 PM 04/23/2023    1:54 PM  Fall Risk   Falls in the past year? 1 1 0 1 1  Number falls in past yr: 1 1 0 1 1  Injury with Fall? 1 1 0 0 1  Risk for fall due to : History of fall(s) Impaired balance/gait;Impaired mobility No Fall Risks History of fall(s)   Follow up Falls evaluation completed Education provided;Falls evaluation completed;Falls prevention discussed Falls prevention discussed;Falls evaluation completed      MEDICARE RISK AT HOME: Medicare Risk at Home Any stairs in or around the home?: Yes If so, are there any without handrails?: No Home free of loose throw rugs in walkways, pet beds, electrical cords, etc?: No (wife will clean her house) Adequate lighting in your home to reduce risk of falls?: Yes Life alert?: No Use of a cane, walker or w/c?: Yes Grab bars in the bathroom?: No Shower chair or bench in shower?: Yes Elevated toilet seat or a handicapped toilet?: Yes  TIMED UP AND GO:  Was the test performed?  Yes  Length of time to ambulate 10 feet: <10 sec Gait slow and steady without use of assistive device    Cognitive Function:        11/15/2023    2:18 PM 11/09/2021    2:15 PM 11/03/2020    2:20 PM 10/30/2018    1:20 PM  6CIT Screen  What Year? 0 points 0 points 0 points 0 points  What month? 0 points 0 points 0 points 0 points  What time? 0 points 0 points 0 points 0 points  Count back from 20 0 points 0 points 0 points 0 points  Months in reverse 0 points 2 points 0 points 0 points  Repeat phrase 2 points 2 points 4  points 0 points  Total Score 2 points 4 points 4 points 0 points    Immunizations Immunization History  Administered Date(s) Administered   Fluad Quad(high Dose 65+) 01/25/2020, 02/20/2021, 03/16/2022   Fluad Trivalent(High Dose 65+) 01/09/2023   Influenza Split 01/30/2012, 02/10/2014   Influenza Whole 02/17/2009, 02/18/2013   Influenza, High Dose Seasonal PF 03/19/2018, 02/02/2019   Influenza, Quadrivalent, Recombinant, Inj, Pf 02/23/2017   Influenza,inj,Quad PF,6+ Mos 02/23/2017   Influenza-Unspecified 02/08/2014, 02/24/2015, 03/16/2016   Moderna Covid-19 Fall Seasonal Vaccine 58yrs & older 03/27/2023   Moderna Sars-Covid-2 Vaccination 07/21/2019, 08/18/2019, 05/03/2020, 09/28/2020, 01/11/2021   Pneumococcal Conjugate-13 04/09/2013   Pneumococcal Polysaccharide-23 10/18/2004   Td 09/29/1990   Tdap 07/25/2011   Zoster Recombinant(Shingrix)  10/09/2016, 04/06/2021, 07/04/2021    TDAP status: Due, Education has been provided regarding the importance of this vaccine. Advised may receive this vaccine at local pharmacy or Health Dept. Aware to provide a copy of the vaccination record if obtained from local pharmacy or Health Dept. Verbalized acceptance and understanding.  Flu Vaccine status: Up to date  Pneumococcal vaccine status: Due, Education has been provided regarding the importance of this vaccine. Advised may receive this vaccine at local pharmacy or Health Dept. Aware to provide a copy of the vaccination record if obtained from local pharmacy or Health Dept. Verbalized acceptance and understanding.  Covid-19 vaccine status: Information provided on how to obtain vaccines.   Qualifies for Shingles Vaccine? Yes   Zostavax completed Yes   Shingrix Completed?: Yes  Screening Tests Health Maintenance  Topic Date Due   DTaP/Tdap/Td (3 - Td or Tdap) 07/24/2021   COVID-19 Vaccine (7 - Moderna risk 2024-25 season) 12/06/2023 (Originally 09/24/2023)   INFLUENZA VACCINE  12/06/2023    Medicare Annual Wellness (AWV)  11/14/2024   Pneumococcal Vaccine: 50+ Years  Completed   Zoster Vaccines- Shingrix  Completed   Hepatitis B Vaccines  Aged Out   HPV VACCINES  Aged Out   Meningococcal B Vaccine  Aged Out   Colonoscopy  Discontinued    Health Maintenance  Health Maintenance Due  Topic Date Due   DTaP/Tdap/Td (3 - Td or Tdap) 07/24/2021    Colorectal cancer screening: Type of screening: Colonoscopy. Completed 05/15/13. Repeat every 0 years  Lung Cancer Screening: (Low Dose CT Chest recommended if Age 29-80 years, 20 pack-year currently smoking OR have quit w/in 15years.) does not qualify.   Lung Cancer Screening Referral: N/A  Additional Screening:  Hepatitis C Screening: does qualify; Completed needs to be done on next visit  Vision Screening: Recommended annual ophthalmology exams for early detection of glaucoma and other disorders of the eye. Is the patient up to date with their annual eye exam?  Yes  Who is the provider or what is the name of the office in which the patient attends annual eye exams? Dr. Octavia If pt is not established with a provider, would they like to be referred to a provider to establish care? No .   Dental Screening: Recommended annual dental exams for proper oral hygiene  Diabetic Foot Exam: Diabetic Foot Exam: Overdue, Pt has been advised about the importance in completing this exam. Pt is scheduled for diabetic foot exam on next visit.  Community Resource Referral / Chronic Care Management: CRR required this visit?  No   CCM required this visit?  No     Plan:     I have personally reviewed and noted the following in the patient's chart:   Medical and social history Use of alcohol, tobacco or illicit drugs  Current medications and supplements including opioid prescriptions. Patient is not currently taking opioid prescriptions. Functional ability and status Nutritional status Physical activity Advanced directives List of  other physicians Hospitalizations, surgeries, and ER visits in previous 12 months Vitals Screenings to include cognitive, depression, and falls Referrals and appointments  In addition, I have reviewed and discussed with patient certain preventive protocols, quality metrics, and best practice recommendations. A written personalized care plan for preventive services as well as general preventive health recommendations were provided to patient.     Milena Liggett Medina-Vargas, NP   11/15/2023   After Visit Summary: (In Person-Printed) AVS printed and given to the patient  Nurse Notes:  needs annual  wellness visit, needs tetanus, COVID-19 and pneumonia vaccine.

## 2023-11-19 ENCOUNTER — Ambulatory Visit

## 2023-11-19 DIAGNOSIS — R2681 Unsteadiness on feet: Secondary | ICD-10-CM

## 2023-11-19 DIAGNOSIS — R296 Repeated falls: Secondary | ICD-10-CM | POA: Diagnosis not present

## 2023-11-19 DIAGNOSIS — M6281 Muscle weakness (generalized): Secondary | ICD-10-CM

## 2023-11-19 DIAGNOSIS — M542 Cervicalgia: Secondary | ICD-10-CM | POA: Diagnosis not present

## 2023-11-19 DIAGNOSIS — M4123 Other idiopathic scoliosis, cervicothoracic region: Secondary | ICD-10-CM | POA: Diagnosis not present

## 2023-11-19 DIAGNOSIS — R293 Abnormal posture: Secondary | ICD-10-CM | POA: Diagnosis not present

## 2023-11-19 NOTE — Progress Notes (Unsigned)
 Location:  [facility]  POS: Clinic  Provider: Tawni America, ANP  Code Status: *** Goals of Care:     11/15/2023    2:16 PM  Advanced Directives  Does Patient Have a Medical Advance Directive? No     Chief Complaint  Patient presents with   Follow-up    Discuss the need for Tdap    HPI: Discussed the use of AI scribe software for clinical note transcription with the patient, who gave verbal consent to proceed.  History of Present Illness          Follows with care management Working with PT due to falls.  CIT score 2 11/15/23 CHF GERD HTN HLD CKD CAD BPH OP VItamin D Afib Sleep apnea IDA  Past Medical History:  Diagnosis Date   Acute gastric ulcer without mention of hemorrhage, perforation, or obstruction    Allergic rhinitis, cause unspecified    Anemia, unspecified    Arthritis    Asthma    Blood transfusion without reported diagnosis    CAD (coronary artery disease)    Diaphragmatic hernia without mention of obstruction or gangrene    Elevated prostate specific antigen (PSA)    Enlarged prostate    Esophageal reflux    Extrinsic asthma, unspecified    Herpes zoster without mention of complication    Hyperlipidemia    Hypersomnia with sleep apnea, unspecified    Impotence of organic origin    Kyphosis    Lumbago    Obstructive sleep apnea (adult) (pediatric)    Pneumonia    hx of years ago    Senile osteoporosis    Trigger finger (acquired)    Tubular adenoma of colon 05/2013   Unspecified essential hypertension    Unspecified sleep apnea    CPAP- settings 4-12    Unspecified vitamin D deficiency     Past Surgical History:  Procedure Laterality Date   CARDIAC CATHETERIZATION  03/04/2009   Dr Kerrin   COLONOSCOPY     CORONARY ARTERY BYPASS GRAFT  2010   CYSTOSCOPY WITH RETROGRADE PYELOGRAM, URETEROSCOPY AND STENT PLACEMENT Right 10/04/2013   Procedure: CYSTOSCOPY WITH RETROGRADE PYELOGRAM,  AND STENT PLACEMENT;  Surgeon:  Donnice Brooks, MD;  Location: [facility];  Service: Urology;  Laterality: Right;   DENTAL SURGERY N/A    MOLE REMOVAL  1958   chin   ROTATOR CUFF REPAIR Left 04/1998   with bone spur removed, Dr Shari   TONSILLECTOMY  1945   TRANSURETHRAL RESECTION OF PROSTATE N/A 09/24/2013   Procedure: TRANSURETHRAL RESECTION OF THE PROSTATE (TURP) WITH GYRUS (STAGED RIGHT LATERAL AND MEDIAN LOBE);  Surgeon: Arlena LILLETTE Gal, MD;  Location: [facility];  Service: Urology;  Laterality: N/A;   UPPER GI ENDOSCOPY      Allergies  Allergen Reactions   Compazine [Prochlorperazine Edisylate] Anxiety    Outpatient Encounter Medications as of 11/20/2023  Medication Sig   albuterol  (VENTOLIN  HFA) 108 (90 Base) MCG/ACT inhaler Inhale 1-2 puffs into the lungs every 6 (six) hours as needed for wheezing or shortness of breath.   alum & mag hydroxide-simeth (MAALOX MAX) 400-400-40 MG/5ML suspension Take 10 mLs by mouth every 6 (six) hours as needed (pain with swallowing and reflux).   amiodarone  (PACERONE ) 200 MG tablet Take 1 tablet (200 mg total) by mouth daily.   Ascorbic Acid (VITAMIN C) 1000 MG tablet Take 1,000 mg by mouth daily.   Calcium  Citrate-Vitamin D (EQ CALCIUM  CITRATE+D3 PO) Take 1,200 mg by mouth daily.  carvedilol  (COREG ) 3.125 MG tablet TAKE 1 TABLET BY MOUTH TWICE  DAILY   Coenzyme Q10 (COQ10) 200 MG CAPS Take 200 mg by mouth daily.   doxazosin  (CARDURA ) 2 MG tablet TAKE 1 TABLET BY MOUTH DAILY   empagliflozin  (JARDIANCE ) 10 MG TABS tablet Take 1 tablet (10 mg total) by mouth daily before breakfast.   famotidine  (PEPCID ) 20 MG tablet TAKE 1 TABLET BY MOUTH TWICE  DAILY   finasteride  (PROSCAR ) 5 MG tablet TAKE 1 TABLET BY MOUTH IN  THE MORNING   fluticasone  (FLONASE ) 50 MCG/ACT nasal spray Place 1 spray into both nostrils daily.   furosemide  (LASIX ) 20 MG tablet TAKE 1 TABLET BY MOUTH DAILY AS  NEEDED FOR LEG SWELLING   Iron, Ferrous Sulfate, 325 (65 Fe) MG TABS Take 325 mg by mouth 2 (two) times  daily.   lactose free nutrition (BOOST) LIQD Take 237 mLs by mouth every Monday, Wednesday, and Friday.   losartan  (COZAAR ) 25 MG tablet TAKE 1 TABLET BY MOUTH DAILY  PLEASE KEEP SCHEDULED  APPOINTMENT WITH CARDIOLOGIST   mexiletine (MEXITIL) 150 MG capsule Take 1 capsule (150 mg total) by mouth 2 (two) times daily.   pantoprazole  (PROTONIX ) 40 MG tablet TAKE 1 TABLET BY MOUTH DAILY   rosuvastatin  (CRESTOR ) 20 MG tablet TAKE 1 TABLET BY MOUTH ONCE  DAILY FOR CHOLESTEROL   warfarin (COUMADIN ) 2.5 MG tablet TAKE 1/2 TO 1 TABLET BY MOUTH  DAILY AS DIRECTED BY THE  COUMADIN  CLINIC   No facility-administered encounter medications on file as of 11/20/2023.    Review of Systems:  Review of Systems  Health Maintenance  Topic Date Due   DTaP/Tdap/Td (3 - Td or Tdap) 07/24/2021   COVID-19 Vaccine (7 - Moderna risk 2024-25 season) 12/06/2023 (Originally 09/24/2023)   INFLUENZA VACCINE  12/06/2023   Medicare Annual Wellness (AWV)  11/14/2024   Pneumococcal Vaccine: 50+ Years  Completed   Zoster Vaccines- Shingrix  Completed   Hepatitis B Vaccines  Aged Out   HPV VACCINES  Aged Out   Meningococcal B Vaccine  Aged Out   Colonoscopy  Discontinued    Physical Exam: There were no vitals filed for this visit. There is no height or weight on file to calculate BMI. Physical Exam  Labs reviewed: Basic Metabolic Panel: Recent Labs    01/18/23 1450 08/23/23 0258 10/03/23 1436  NA 141 134* 140  K 4.3 4.2 4.1  CL 106 103 106  CO2 25 21* 20  GLUCOSE 50* 103* 91  BUN 29* 26* 21  CREATININE 1.09 1.24 1.07  CALCIUM  8.9 8.6* 8.7  TSH 0.957  --  1.490   Liver Function Tests: Recent Labs    01/18/23 1450 10/03/23 1436  AST 22 29  ALT 23 25  ALKPHOS 59 57  BILITOT 0.6 0.8  PROT 6.1 6.0  ALBUMIN 3.7 3.8   No results for input(s): LIPASE, AMYLASE in the last 8760 hours. No results for input(s): AMMONIA in the last 8760 hours. CBC: Recent Labs    01/18/23 1450 08/23/23 0258  WBC  4.7 6.9  NEUTROABS  --  5.6  HGB 11.9* 13.3  HCT 33.2* 38.8*  MCV 99* 90.7  PLT 198 156   Lipid Panel: Recent Labs    01/18/23 1450  CHOL 116  HDL 58  LDLCALC 46  TRIG 49  CHOLHDL 2.0   Lab Results  Component Value Date   HGBA1C 5.6 07/28/2012    Procedures since last visit: No results found.  Assessment/Plan  Assessment and Plan               Labs/tests ordered:  * No order type specified * Next appt:  03/20/2024   Total time **:  time greater than 50% of total time spent doing pt counseling and coordination of care

## 2023-11-19 NOTE — Therapy (Signed)
 OUTPATIENT PHYSICAL THERAPY NEURO TREATMENT   Patient Name: Brent Griffith MRN: [id number] DOB:02-15-1936, 88 y.o., male Today's Date: 11/19/2023   PCP: Brent Harlene POUR, NP REFERRING PROVIDER: [name], NP  END OF SESSION:  PT End of Session - 11/19/23 1451     Visit Number 4    Number of Visits 16    Date for PT Re-Evaluation 12/25/23    Authorization Type [employer]    Authorization Time Period Auth requested 10/30/23    Progress Note Due on Visit 10    PT Start Time 1404    PT Stop Time 1450    PT Time Calculation (min) 46 min    Activity Tolerance Patient tolerated treatment well    Behavior During Therapy WFL for tasks assessed/performed           Past Medical History:  Diagnosis Date   Acute gastric ulcer without mention of hemorrhage, perforation, or obstruction    Allergic rhinitis, cause unspecified    Anemia, unspecified    Arthritis    Asthma    Blood transfusion without reported diagnosis    CAD (coronary artery disease)    Diaphragmatic hernia without mention of obstruction or gangrene    Elevated prostate specific antigen (PSA)    Enlarged prostate    Esophageal reflux    Extrinsic asthma, unspecified    Herpes zoster without mention of complication    Hyperlipidemia    Hypersomnia with sleep apnea, unspecified    Impotence of organic origin    Kyphosis    Lumbago    Obstructive sleep apnea (adult) (pediatric)    Pneumonia    hx of years ago    Senile osteoporosis    Trigger finger (acquired)    Tubular adenoma of colon 05/2013   Unspecified essential hypertension    Unspecified sleep apnea    CPAP- settings 4-12    Unspecified vitamin D deficiency    Past Surgical History:  Procedure Laterality Date   CARDIAC CATHETERIZATION  03/04/2009   Dr Brent Griffith   COLONOSCOPY     CORONARY ARTERY BYPASS GRAFT  2010   CYSTOSCOPY WITH RETROGRADE PYELOGRAM, URETEROSCOPY AND STENT PLACEMENT Right 10/04/2013   Procedure: CYSTOSCOPY  WITH RETROGRADE PYELOGRAM,  AND STENT PLACEMENT;  Surgeon: Brent Brooks, MD;  Location: [facility];  Service: Urology;  Laterality: Right;   DENTAL SURGERY N/A    MOLE REMOVAL  1958   chin   ROTATOR CUFF REPAIR Left 04/1998   with bone spur removed, Dr Brent Griffith   TONSILLECTOMY  1945   TRANSURETHRAL RESECTION OF PROSTATE N/A 09/24/2013   Procedure: TRANSURETHRAL RESECTION OF THE PROSTATE (TURP) WITH GYRUS (STAGED RIGHT LATERAL AND MEDIAN LOBE);  Surgeon: Brent LILLETTE Gal, MD;  Location: [facility];  Service: Urology;  Laterality: N/A;   UPPER GI ENDOSCOPY     Patient Active Problem List   Diagnosis Date Noted   Vitamin D deficiency 09/19/2023   Recurrent falls 09/19/2023   CKD (chronic kidney disease) stage 3, GFR 30-59 ml/min (HCC) 07/06/2022   Atrial fibrillation (HCC) 10/25/2021   HFrEF (heart failure with reduced ejection fraction) (HCC) 01/19/2021   CAD (coronary artery disease) 10/11/2017   IDA (iron deficiency anemia) 08/31/2016   Seasonal and perennial allergic rhinitis 08/28/2016   Palpitations 09/07/2015   Cough, persistent 07/20/2015   Senile osteoporosis 06/09/2014   Hydronephrosis of right kidney 10/03/2013   Benign prostatic hyperplasia 09/24/2013   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   Dyslipidemia 04/06/2009   OSA (  obstructive sleep apnea) 02/04/2009   Essential hypertension 09/02/2008   GERD (gastroesophageal reflux disease) 09/02/2008    ONSET DATE: Chronic  REFERRING DIAG: R29.6 (ICD-10-CM) - Recurrent falls  THERAPY DIAG:  Unsteadiness on feet  Frequent falls  Muscle weakness (generalized)  Rationale for Evaluation and Treatment: Rehabilitation  SUBJECTIVE:                                                                                                                                                                                             SUBJECTIVE STATEMENT:  Pt reports no recent falls, no pain but trips over his feet a lot.  Pt comes in due to falls. Has  had history of falls for the last 3 years. Pt is on warfarin and goes to coumadin  clinic. Pt reports he normally falls forward. Has had PT in the past but has not been doing his exercises. Feels he has gotten weaker and not walking as far since stopping PT. Normally able to walk more around his neighborhood but currently only able to walk [place] 2-3 times. Recently got his rollator ~1 month ago Pt accompanied by: significant other, Brent Griffith (wife)  PERTINENT HISTORY: Wears thoracic/cervical brace at baseline. History of severe coronary artery disease with prior CABG. He has ischemic cardiomyopathy with a mildly reduced ejection fraction. He was diagnosed with atrial fibrillation in September 2022 and was started on amiodarone .   PAIN:  Are you having pain? No  PRECAUTIONS: Fall  RED FLAGS: None   WEIGHT BEARING RESTRICTIONS: No  FALLS: Has patient fallen in last 6 months? Yes. Number of falls at least 2 falls -- last fall on 08/23/23 slid off bed and hit head on his dresser  LIVING ENVIRONMENT: Lives with: lives with their spouse Lives in: House/apartment Stairs: 2 step ups to enter Has following equipment at home: Vannie - 4 wheeled  PLOF: Independent  PATIENT GOALS: Improve balance  OBJECTIVE:  Note: Objective measures were completed at Evaluation unless otherwise noted.  DIAGNOSTIC FINDINGS: CT cervical spine 08/23/23 IMPRESSION: 1. No acute traumatic injury identified in the cervical spine. 2. Chronic osteopenia, upper thoracic compression fractures with ankylosis and kyphosis.    COGNITION: Overall cognitive status: Within functional limits for tasks assessed   SENSATION: WFL  EDEMA:  None  MUSCLE TONE: Did not assess  DTRs:  Did not assess  POSTURE: Increased kyphosis. Wears neck and thoracic brace  LOWER EXTREMITY ROM:     Active  Right Eval Left Eval  Hip flexion    Hip extension    Hip abduction    Hip adduction  Hip internal rotation     Hip external rotation    Knee flexion    Knee extension    Ankle dorsiflexion    Ankle plantarflexion    Ankle inversion    Ankle eversion     (Blank rows = not tested)  LOWER EXTREMITY MMT:    MMT Right Eval Left Eval  Hip flexion 5 4  Hip extension 4- 3+  Hip abduction 3 3  Hip adduction    Hip internal rotation    Hip external rotation    Knee flexion 5 5  Knee extension 5 4-  Ankle dorsiflexion    Ankle plantarflexion    Ankle inversion    Ankle eversion    (Blank rows = not tested)  BED MOBILITY:  SBA for safety  TRANSFERS: Sit to stand: Modified independence  Assistive device utilized: Environmental consultant - 4 wheeled     Stand to sit: Modified independence  Assistive device utilized: hands to bed      RAMP:  Not tested  CURB:  Not tested  STAIRS: Not tested GAIT: Findings: Distance walked: 180' and Comments: Decreased bilat step length, forward head  FUNCTIONAL TESTS:  5 times sit to stand: 13.22 sec using back of legs against bed Mini-BESTest: 14     PATIENT SURVEYS:  ABC scale: The Activities-Specific Balance Confidence (ABC) Scale 0% 10 20 30  40 50 60 70 80 90 100% No confidence<->completely confident  "How confident are you that you will not lose your balance or become unsteady when you . . .   Date tested 10/30/23  Walk around the house 90%  2. Walk up or down stairs 90%  3. Bend over and pick up a slipper from in front of a closet floor 90%  4. Reach for a small can off a shelf at eye level 80%  5. Stand on tip toes and reach for something above your head 90%  6. Stand on a chair and reach for something 0%  7. Sweep the floor 100%  8. Walk outside the house to a car parked in the driveway 80%  9. Get into or out of a car 100%  10. Walk across a parking lot to the mall 100% w/ walker  11. Walk up or down a ramp 80% w/ walker  12. Walk in a crowded mall where people rapidly walk past you 80%  13. Are bumped into by people as you walk through the  mall 10%  14. Step onto or off of an escalator while you are holding onto the railing 0%  15. Step onto or off an escalator while holding onto parcels such that you cannot hold onto the railing 0%  16. Walk outside on icy sidewalks 0%  Total: #/16 61.87%  TREATMENT DATE:  11/19/23 THERAPEUTIC ACTIVITIES: To improve functional performance.  Demonstration, verbal and tactile cues throughout for technique. 6 min walk test- 670' Nustep L5x79min UE/LE Seated LAQ 1lb x 10 BLE Seated march 1lb x 10 BLE Standing heel toe strikes x 10 Heel raises x 10 Mini squats x 10  11/12/23 THERAPEUTIC EXERCISE: To improve strength.  Demonstration, verbal and tactile cues throughout for technique. NuStep L5 x 5'  THERAPEUTIC ACTIVITIES: To improve functional performance.  Demonstration, verbal and tactile cues throughout for technique. Counter back extension x 2/10 Doorway pec stretch x 1' x 2 BUE Standing Toe/heel rocking x 20 BLE Heel walking and toe walking at counter x 3 laps STS arms crossed x 2/5 Counter lunges x 10 BLE  NEUROMUSCULAR RE-EDUCATION: To improve balance. Standing Step touch 8 step with manual wt shifting to the L Step stance on LLE x 1' with manual wt shifts to the L L pelvic sideglides at counter x 10 L sidebending at counter reaching RUE overhead x 10 QC step overs 2/10 F/B AND S/S  11/04/23  THERAPEUTIC EXERCISE: To improve strength, ROM, and flexibility.  Demonstration, verbal and tactile cues throughout for technique. UBE L1 4.5' Backward  THERAPEUTIC ACTIVITIES: To improve functional performance.  Demonstration, verbal and tactile cues throughout for technique.  Counter HEP review: Standing toe raises/heel raise rocking f/b Standing hip abd Sit to stand arms crossed 2/5 4 step ups x 10 BLE 4 step touch x 10 BLE Forward lunges x 10  BLE    NEUROMUSCULAR RE-EDUCATION: To improve balance, posture, and proprioception. SLS with min UE counter support x 10 sec x 5 BLE Step overs with QC x 10 f/b and s/s   Added: Standing back extension Standing step stance with wt shifting f/b x 10 BLE  10/30/23 See HEP below    PATIENT EDUCATION: Education details: Exam findings, POC Person educated: Patient Education method: Explanation and Demonstration Education comprehension: verbalized understanding, returned demonstration, and needs further education  HOME EXERCISE PROGRAM: Did not yet initiate new HEP. Prior code from last PT episode is below Access Code: [email or web address] URL: [email or web address] Date: 10/30/2023 Prepared by: Gellen April Earnie Starring  Exercises - Sit to Stand with Arms Crossed  - 1 x daily - 7 x weekly - 2 sets - 5 reps - Heel Raises with Counter Support  - 1 x daily - 7 x weekly - 1 sets - 10 reps - Alternating Step Taps with Counter Support  - 1 x daily - 7 x weekly - 1 sets - 10 reps - Forward Step Over with Counter Support  - 1 x daily - 7 x weekly - 1 sets - 10 reps - Push-Up on Counter  - 1 x daily - 7 x weekly - 1 sets - 10 reps - Standing Bent Over Single Arm Scapular Row with Table Support with PLB  - 1 x daily - 7 x weekly - 1 sets - 10 reps - stomping  - 1 x daily - 7 x weekly - 1 sets - 10 reps - Forward Step Up with Counter Support  - 1 x daily - 7 x weekly - 1 sets - 10 reps - Seated Shoulder Row with Anchored Resistance  - 1 x daily - 7 x weekly - 2 sets - 10 reps - Standing Hip Abduction with Counter Support  - 1 x daily - 7 x weekly - 1 sets - 10 reps - Single Leg Stance with Support  - 1 x  daily - 7 x weekly - 1 sets - 5 reps - Lunge with Counter Support  - 1 x daily - 7 x weekly - 1 sets - 5 reps  GOALS: Goals reviewed with patient? Yes  SHORT TERM GOALS: Target date: 11/27/2023   Pt will be ind with initial HEP Baseline: Goal status: INITIAL  2.  Pt will have  improved TUG to </=13 sec Baseline: 15.03 Goal status: INITIAL  3.  PT will obtain 6 min walk test baseline Baseline:  Goal status: MET- 7/15 25   LONG TERM GOALS: Target date: 12/25/2023   Pt will be ind with management and progression of HEP Baseline:  Goal status: INITIAL  2.  Pt will have improved mini BESTest by 4 points to demo MCID Baseline: 14 Goal status: INITIAL  3.  Pt will demo at least 4/5 bilat LE strength for increased strength and stability Baseline:  Goal status: INITIAL  4.  Pt will have improved ABC score to >/=67% to be better than cut-off score for balance impairments Baseline: 61.87% Goal status: INITIAL  5.  Pt will have improved 6 min walk test by at least 160' to demo MCID for increased endurance Baseline:  Goal status: INITIAL    ASSESSMENT:  CLINICAL IMPRESSION: Assessed , pt able to complete test w/o breaks, no SOB or fatigue. As the session, progressed he became more fatigued.  Patient has difficulty shifting weight to the L with all gait and balance activities to unweight and lift the R foot.  Min PT assistance is required at times for L wt shifting.   Patient is a 88 y.o. M who was seen today for physical therapy evaluation and treatment for falls. PMH is significant for multiple falls, CAD with prior CABG, and increased kyphosis. Assessment demos general weakness, abnormal posture, decreased endurance and high fall risk with history of multiple falls. Pt will benefit from PT to address these deficits to maximize his level of safety.    OBJECTIVE IMPAIRMENTS: Abnormal gait, decreased activity tolerance, decreased balance, decreased endurance, decreased mobility, difficulty walking, decreased strength, improper body mechanics, and postural dysfunction.   ACTIVITY LIMITATIONS: standing, squatting, stairs, transfers, and locomotion level  PARTICIPATION LIMITATIONS: cleaning, shopping, and community activity  PERSONAL FACTORS: Age, Fitness,  Past/current experiences, and Time since onset of injury/illness/exacerbation are also affecting patient's functional outcome.   REHAB POTENTIAL: Good  CLINICAL DECISION MAKING: Evolving/moderate complexity  EVALUATION COMPLEXITY: Moderate  PLAN:  PT FREQUENCY: 2x/week  PT DURATION: 10 weeks  PLANNED INTERVENTIONS: 97164- PT Re-evaluation, 97750- Physical Performance Testing, 97110-Therapeutic exercises, 97530- Therapeutic activity, 97112- Neuromuscular re-education, 97535- Self Care, [id number]- Manual therapy, [phone]- Gait training, [phone]- Aquatic Therapy, [phone]- Electrical stimulation (unattended), [id number]- Ultrasound, [id number]- Ionotophoresis 4mg /ml Dexamethasone , [id number] (1-2 muscles), [id number] (3+ muscles)- Dry Needling, Patient/Family education, Balance training, Stair training, Taping, Joint mobilization, Cryotherapy, and Moist heat  PLAN FOR NEXT SESSION: Continue to progress dynamic balance activities with focus on good weight shift to the LLE  Kalissa Grays L Clifford Benninger, PTA 11/19/2023, 3:31 PM

## 2023-11-20 ENCOUNTER — Encounter: Admitting: Adult Health

## 2023-11-22 ENCOUNTER — Telehealth: Payer: Self-pay

## 2023-11-25 ENCOUNTER — Ambulatory Visit

## 2023-11-25 DIAGNOSIS — R2681 Unsteadiness on feet: Secondary | ICD-10-CM | POA: Diagnosis not present

## 2023-11-25 DIAGNOSIS — R296 Repeated falls: Secondary | ICD-10-CM

## 2023-11-25 DIAGNOSIS — R293 Abnormal posture: Secondary | ICD-10-CM | POA: Diagnosis not present

## 2023-11-25 DIAGNOSIS — M6281 Muscle weakness (generalized): Secondary | ICD-10-CM

## 2023-11-25 DIAGNOSIS — M542 Cervicalgia: Secondary | ICD-10-CM | POA: Diagnosis not present

## 2023-11-25 DIAGNOSIS — M4123 Other idiopathic scoliosis, cervicothoracic region: Secondary | ICD-10-CM | POA: Diagnosis not present

## 2023-11-25 NOTE — Therapy (Signed)
 OUTPATIENT PHYSICAL THERAPY NEURO TREATMENT   Patient Name: Brent Griffith MRN: [id number] DOB:1935/07/31, 88 y.o., male Today's Date: 11/25/2023   PCP: [name], NP REFERRING PROVIDER: [name], NP  END OF SESSION:  PT End of Session - 11/25/23 1532     Visit Number 5    Number of Visits 16    Date for PT Re-Evaluation 12/25/23    Authorization Type [employer]    Authorization Time Period Auth requested 10/30/23    Progress Note Due on Visit 10    PT Start Time 1446    PT Stop Time 1531    PT Time Calculation (min) 45 min    Activity Tolerance Patient tolerated treatment well    Behavior During Therapy WFL for tasks assessed/performed            Past Medical History:  Diagnosis Date   Acute gastric ulcer without mention of hemorrhage, perforation, or obstruction    Allergic rhinitis, cause unspecified    Anemia, unspecified    Arthritis    Asthma    Blood transfusion without reported diagnosis    CAD (coronary artery disease)    Diaphragmatic hernia without mention of obstruction or gangrene    Elevated prostate specific antigen (PSA)    Enlarged prostate    Esophageal reflux    Extrinsic asthma, unspecified    Herpes zoster without mention of complication    Hyperlipidemia    Hypersomnia with sleep apnea, unspecified    Impotence of organic origin    Kyphosis    Lumbago    Obstructive sleep apnea (adult) (pediatric)    Pneumonia    hx of years ago    Senile osteoporosis    Trigger finger (acquired)    Tubular adenoma of colon 05/2013   Unspecified essential hypertension    Unspecified sleep apnea    CPAP- settings 4-12    Unspecified vitamin D deficiency    Past Surgical History:  Procedure Laterality Date   CARDIAC CATHETERIZATION  03/04/2009   Dr Kerrin   COLONOSCOPY     CORONARY ARTERY BYPASS GRAFT  2010   CYSTOSCOPY WITH RETROGRADE PYELOGRAM, URETEROSCOPY AND STENT PLACEMENT Right 10/04/2013   Procedure: CYSTOSCOPY  WITH RETROGRADE PYELOGRAM,  AND STENT PLACEMENT;  Surgeon: Donnice Brooks, MD;  Location: [facility];  Service: Urology;  Laterality: Right;   DENTAL SURGERY N/A    MOLE REMOVAL  1958   chin   ROTATOR CUFF REPAIR Left 04/1998   with bone spur removed, Dr Shari   TONSILLECTOMY  1945   TRANSURETHRAL RESECTION OF PROSTATE N/A 09/24/2013   Procedure: TRANSURETHRAL RESECTION OF THE PROSTATE (TURP) WITH GYRUS (STAGED RIGHT LATERAL AND MEDIAN LOBE);  Surgeon: Arlena LILLETTE Gal, MD;  Location: [facility];  Service: Urology;  Laterality: N/A;   UPPER GI ENDOSCOPY     Patient Active Problem List   Diagnosis Date Noted   Vitamin D deficiency 09/19/2023   Recurrent falls 09/19/2023   CKD (chronic kidney disease) stage 3, GFR 30-59 ml/min (HCC) 07/06/2022   Atrial fibrillation (HCC) 10/25/2021   HFrEF (heart failure with reduced ejection fraction) (HCC) 01/19/2021   CAD (coronary artery disease) 10/11/2017   IDA (iron deficiency anemia) 08/31/2016   Seasonal and perennial allergic rhinitis 08/28/2016   Palpitations 09/07/2015   Cough, persistent 07/20/2015   Senile osteoporosis 06/09/2014   Hydronephrosis of right kidney 10/03/2013   Benign prostatic hyperplasia 09/24/2013   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   Dyslipidemia 04/06/2009  OSA (obstructive sleep apnea) 02/04/2009   Essential hypertension 09/02/2008   GERD (gastroesophageal reflux disease) 09/02/2008    ONSET DATE: Chronic  REFERRING DIAG: R29.6 (ICD-10-CM) - Recurrent falls  THERAPY DIAG:  Unsteadiness on feet  Frequent falls  Muscle weakness (generalized)  Abnormal posture  Rationale for Evaluation and Treatment: Rehabilitation  SUBJECTIVE:                                                                                                                                                                                             SUBJECTIVE STATEMENT:  Pt reports no recent falls, no pain  Pt comes in due to falls. Has had history  of falls for the last 3 years. Pt is on warfarin and goes to coumadin  clinic. Pt reports he normally falls forward. Has had PT in the past but has not been doing his exercises. Feels he has gotten weaker and not walking as far since stopping PT. Normally able to walk more around his neighborhood but currently only able to walk [place] 2-3 times. Recently got his rollator ~1 month ago Pt accompanied by: significant other, [name] (wife)  PERTINENT HISTORY: Wears thoracic/cervical brace at baseline. History of severe coronary artery disease with prior CABG. He has ischemic cardiomyopathy with a mildly reduced ejection fraction. He was diagnosed with atrial fibrillation in September 2022 and was started on amiodarone .   PAIN:  Are you having pain? No  PRECAUTIONS: Fall  RED FLAGS: None   WEIGHT BEARING RESTRICTIONS: No  FALLS: Has patient fallen in last 6 months? Yes. Number of falls at least 2 falls -- last fall on 08/23/23 slid off bed and hit head on his dresser  LIVING ENVIRONMENT: Lives with: lives with their spouse Lives in: House/apartment Stairs: 2 step ups to enter Has following equipment at home: Vannie - 4 wheeled  PLOF: Independent  PATIENT GOALS: Improve balance  OBJECTIVE:  Note: Objective measures were completed at Evaluation unless otherwise noted.  DIAGNOSTIC FINDINGS: CT cervical spine 08/23/23 IMPRESSION: 1. No acute traumatic injury identified in the cervical spine. 2. Chronic osteopenia, upper thoracic compression fractures with ankylosis and kyphosis.    COGNITION: Overall cognitive status: Within functional limits for tasks assessed   SENSATION: WFL  EDEMA:  None  MUSCLE TONE: Did not assess  DTRs:  Did not assess  POSTURE: Increased kyphosis. Wears neck and thoracic brace  LOWER EXTREMITY ROM:     Active  Right Eval Left Eval  Hip flexion    Hip extension    Hip abduction    Hip adduction    Hip internal rotation  Hip  external rotation    Knee flexion    Knee extension    Ankle dorsiflexion    Ankle plantarflexion    Ankle inversion    Ankle eversion     (Blank rows = not tested)  LOWER EXTREMITY MMT:    MMT Right Eval Left Eval  Hip flexion 5 4  Hip extension 4- 3+  Hip abduction 3 3  Hip adduction    Hip internal rotation    Hip external rotation    Knee flexion 5 5  Knee extension 5 4-  Ankle dorsiflexion    Ankle plantarflexion    Ankle inversion    Ankle eversion    (Blank rows = not tested)  BED MOBILITY:  SBA for safety  TRANSFERS: Sit to stand: Modified independence  Assistive device utilized: Environmental consultant - 4 wheeled     Stand to sit: Modified independence  Assistive device utilized: hands to bed      RAMP:  Not tested  CURB:  Not tested  STAIRS: Not tested GAIT: Findings: Distance walked: 180' and Comments: Decreased bilat step length, forward head  FUNCTIONAL TESTS:  5 times sit to stand: 13.22 sec using back of legs against bed Mini-BESTest: 14     PATIENT SURVEYS:  ABC scale: The Activities-Specific Balance Confidence (ABC) Scale 0% 10 20 30  40 50 60 70 80 90 100% No confidence<->completely confident  "How confident are you that you will not lose your balance or become unsteady when you . . .   Date tested 10/30/23  Walk around the house 90%  2. Walk up or down stairs 90%  3. Bend over and pick up a slipper from in front of a closet floor 90%  4. Reach for a small can off a shelf at eye level 80%  5. Stand on tip toes and reach for something above your head 90%  6. Stand on a chair and reach for something 0%  7. Sweep the floor 100%  8. Walk outside the house to a car parked in the driveway 80%  9. Get into or out of a car 100%  10. Walk across a parking lot to the mall 100% w/ walker  11. Walk up or down a ramp 80% w/ walker  12. Walk in a crowded mall where people rapidly walk past you 80%  13. Are bumped into by people as you walk through the mall  10%  14. Step onto or off of an escalator while you are holding onto the railing 0%  15. Step onto or off an escalator while holding onto parcels such that you cannot hold onto the railing 0%  16. Walk outside on icy sidewalks 0%  Total: #/16 61.87%                                                                                                                                 TREATMENT DATE:  11/25/23  Nustep L5x6min TUG - 14.13 sec Sit to stand x 10  Standing ant/post staggered WS x 10 Step over pool noodle 2x10 fwd Lateral step over pool noodle 2x10 Step ups 4' x 20 BLE  11/19/23 THERAPEUTIC ACTIVITIES: To improve functional performance.  Demonstration, verbal and tactile cues throughout for technique. 6 min walk test- 670' Nustep L5x7min UE/LE Seated LAQ 1lb x 10 BLE Seated march 1lb x 10 BLE Standing heel toe strikes x 10 Heel raises x 10 Mini squats x 10  11/12/23 THERAPEUTIC EXERCISE: To improve strength.  Demonstration, verbal and tactile cues throughout for technique. NuStep L5 x 5'  THERAPEUTIC ACTIVITIES: To improve functional performance.  Demonstration, verbal and tactile cues throughout for technique. Counter back extension x 2/10 Doorway pec stretch x 1' x 2 BUE Standing Toe/heel rocking x 20 BLE Heel walking and toe walking at counter x 3 laps STS arms crossed x 2/5 Counter lunges x 10 BLE  NEUROMUSCULAR RE-EDUCATION: To improve balance. Standing Step touch 8 step with manual wt shifting to the L Step stance on LLE x 1' with manual wt shifts to the L L pelvic sideglides at counter x 10 L sidebending at counter reaching RUE overhead x 10 QC step overs 2/10 F/B AND S/S  11/04/23  THERAPEUTIC EXERCISE: To improve strength, ROM, and flexibility.  Demonstration, verbal and tactile cues throughout for technique. UBE L1 4.5' Backward  THERAPEUTIC ACTIVITIES: To improve functional performance.  Demonstration, verbal and tactile cues throughout for  technique.  Counter HEP review: Standing toe raises/heel raise rocking f/b Standing hip abd Sit to stand arms crossed 2/5 4 step ups x 10 BLE 4 step touch x 10 BLE Forward lunges x 10 BLE    NEUROMUSCULAR RE-EDUCATION: To improve balance, posture, and proprioception. SLS with min UE counter support x 10 sec x 5 BLE Step overs with QC x 10 f/b and s/s   Added: Standing back extension Standing step stance with wt shifting f/b x 10 BLE  10/30/23 See HEP below    PATIENT EDUCATION: Education details: Exam findings, POC Person educated: Patient Education method: Explanation and Demonstration Education comprehension: verbalized understanding, returned demonstration, and needs further education  HOME EXERCISE PROGRAM: Did not yet initiate new HEP. Prior code from last PT episode is below Access Code: [email or web address] URL: [email or web address] Date: 10/30/2023 Prepared by: Gellen April Earnie Starring  Exercises - Sit to Stand with Arms Crossed  - 1 x daily - 7 x weekly - 2 sets - 5 reps - Heel Raises with Counter Support  - 1 x daily - 7 x weekly - 1 sets - 10 reps - Alternating Step Taps with Counter Support  - 1 x daily - 7 x weekly - 1 sets - 10 reps - Forward Step Over with Counter Support  - 1 x daily - 7 x weekly - 1 sets - 10 reps - Push-Up on Counter  - 1 x daily - 7 x weekly - 1 sets - 10 reps - Standing Bent Over Single Arm Scapular Row with Table Support with PLB  - 1 x daily - 7 x weekly - 1 sets - 10 reps - stomping  - 1 x daily - 7 x weekly - 1 sets - 10 reps - Forward Step Up with Counter Support  - 1 x daily - 7 x weekly - 1 sets - 10 reps - Seated Shoulder Row with Anchored Resistance  - 1 x daily - 7 x weekly - 2 sets -  10 reps - Standing Hip Abduction with Counter Support  - 1 x daily - 7 x weekly - 1 sets - 10 reps - Single Leg Stance with Support  - 1 x daily - 7 x weekly - 1 sets - 5 reps - Lunge with Counter Support  - 1 x daily - 7 x weekly - 1  sets - 5 reps  GOALS: Goals reviewed with patient? Yes  SHORT TERM GOALS: Target date: 11/27/2023   Pt will be ind with initial HEP Baseline: Goal status: INITIAL  2.  Pt will have improved TUG to </=13 sec Baseline: 15.03 Goal status: PROGRESSING- 14.13 sec (11/25/23)  3.  PT will obtain 6 min walk test baseline Baseline:  Goal status: MET- 7/15 25   LONG TERM GOALS: Target date: 12/25/2023   Pt will be ind with management and progression of HEP Baseline:  Goal status: INITIAL  2.  Pt will have improved mini BESTest by 4 points to demo MCID Baseline: 14 Goal status: INITIAL  3.  Pt will demo at least 4/5 bilat LE strength for increased strength and stability Baseline:  Goal status: INITIAL  4.  Pt will have improved ABC score to >/=67% to be better than cut-off score for balance impairments Baseline: 61.87% Goal status: INITIAL  5.  Pt will have improved 6 min walk test by at least 160' to demo MCID for increased endurance Baseline:  Goal status: INITIAL    ASSESSMENT:  CLINICAL IMPRESSION: Pt is showing progress with TUG score. We worked on functional performance being able to step over high threshold steps and foot clearance. Pt fatigued after session but no increased pain. Cuing given for posture and to lift high enough to clear targets during session.  Patient is a 88 y.o. M who was seen today for physical therapy evaluation and treatment for falls. PMH is significant for multiple falls, CAD with prior CABG, and increased kyphosis. Assessment demos general weakness, abnormal posture, decreased endurance and high fall risk with history of multiple falls. Pt will benefit from PT to address these deficits to maximize his level of safety.    OBJECTIVE IMPAIRMENTS: Abnormal gait, decreased activity tolerance, decreased balance, decreased endurance, decreased mobility, difficulty walking, decreased strength, improper body mechanics, and postural dysfunction.    ACTIVITY LIMITATIONS: standing, squatting, stairs, transfers, and locomotion level  PARTICIPATION LIMITATIONS: cleaning, shopping, and community activity  PERSONAL FACTORS: Age, Fitness, Past/current experiences, and Time since onset of injury/illness/exacerbation are also affecting patient's functional outcome.   REHAB POTENTIAL: Good  CLINICAL DECISION MAKING: Evolving/moderate complexity  EVALUATION COMPLEXITY: Moderate  PLAN:  PT FREQUENCY: 2x/week  PT DURATION: 10 weeks  PLANNED INTERVENTIONS: 97164- PT Re-evaluation, 97750- Physical Performance Testing, 97110-Therapeutic exercises, 97530- Therapeutic activity, 97112- Neuromuscular re-education, 97535- Self Care, [id number]- Manual therapy, [phone]- Gait training, [phone]- Aquatic Therapy, [phone]- Electrical stimulation (unattended), [phone]- Ultrasound, [id number]- Ionotophoresis 4mg /ml Dexamethasone , 79439 (1-2 muscles), 20561 (3+ muscles)- Dry Needling, Patient/Family education, Balance training, Stair training, Taping, Joint mobilization, Cryotherapy, and Moist heat  PLAN FOR NEXT SESSION: Continue to progress dynamic balance activities with focus on good weight shift to the LLE  [employer], PTA 11/25/2023, 5:08 PM

## 2023-11-27 ENCOUNTER — Ambulatory Visit: Admitting: Rehabilitation

## 2023-11-27 DIAGNOSIS — M4123 Other idiopathic scoliosis, cervicothoracic region: Secondary | ICD-10-CM

## 2023-11-27 DIAGNOSIS — R296 Repeated falls: Secondary | ICD-10-CM | POA: Diagnosis not present

## 2023-11-27 DIAGNOSIS — M542 Cervicalgia: Secondary | ICD-10-CM | POA: Diagnosis not present

## 2023-11-27 DIAGNOSIS — R2681 Unsteadiness on feet: Secondary | ICD-10-CM

## 2023-11-27 DIAGNOSIS — M6281 Muscle weakness (generalized): Secondary | ICD-10-CM | POA: Diagnosis not present

## 2023-11-27 DIAGNOSIS — R293 Abnormal posture: Secondary | ICD-10-CM | POA: Diagnosis not present

## 2023-11-27 NOTE — Therapy (Signed)
 OUTPATIENT PHYSICAL THERAPY NEURO TREATMENT/ PROGRESS NOTE   Patient Name: Brent Griffith MRN: [id number] DOB:26-Jan-1936, 88 y.o., male Today's Date: 11/27/2023   PCP: [name], NP REFERRING PROVIDER: [name], NP  END OF SESSION:  PT End of Session - 11/27/23 1409     Visit Number 6    Number of Visits 16    Date for PT Re-Evaluation 12/25/23    Authorization Type [employer]    Authorization Time Period Auth requested 10/30/23    Progress Note Due on Visit 10    PT Start Time 1406    PT Stop Time 1503    PT Time Calculation (min) 57 min    Activity Tolerance Patient tolerated treatment well    Behavior During Therapy WFL for tasks assessed/performed            Past Medical History:  Diagnosis Date   Acute gastric ulcer without mention of hemorrhage, perforation, or obstruction    Allergic rhinitis, cause unspecified    Anemia, unspecified    Arthritis    Asthma    Blood transfusion without reported diagnosis    CAD (coronary artery disease)    Diaphragmatic hernia without mention of obstruction or gangrene    Elevated prostate specific antigen (PSA)    Enlarged prostate    Esophageal reflux    Extrinsic asthma, unspecified    Herpes zoster without mention of complication    Hyperlipidemia    Hypersomnia with sleep apnea, unspecified    Impotence of organic origin    Kyphosis    Lumbago    Obstructive sleep apnea (adult) (pediatric)    Pneumonia    hx of years ago    Senile osteoporosis    Trigger finger (acquired)    Tubular adenoma of colon 05/2013   Unspecified essential hypertension    Unspecified sleep apnea    CPAP- settings 4-12    Unspecified vitamin D deficiency    Past Surgical History:  Procedure Laterality Date   CARDIAC CATHETERIZATION  03/04/2009   Dr Kerrin   COLONOSCOPY     CORONARY ARTERY BYPASS GRAFT  2010   CYSTOSCOPY WITH RETROGRADE PYELOGRAM, URETEROSCOPY AND STENT PLACEMENT Right 10/04/2013    Procedure: CYSTOSCOPY WITH RETROGRADE PYELOGRAM,  AND STENT PLACEMENT;  Surgeon: Donnice Brooks, MD;  Location: [facility];  Service: Urology;  Laterality: Right;   DENTAL SURGERY N/A    MOLE REMOVAL  1958   chin   ROTATOR CUFF REPAIR Left 04/1998   with bone spur removed, Dr Shari   TONSILLECTOMY  1945   TRANSURETHRAL RESECTION OF PROSTATE N/A 09/24/2013   Procedure: TRANSURETHRAL RESECTION OF THE PROSTATE (TURP) WITH GYRUS (STAGED RIGHT LATERAL AND MEDIAN LOBE);  Surgeon: Arlena LILLETTE Gal, MD;  Location: [facility];  Service: Urology;  Laterality: N/A;   UPPER GI ENDOSCOPY     Patient Active Problem List   Diagnosis Date Noted   Vitamin D deficiency 09/19/2023   Recurrent falls 09/19/2023   CKD (chronic kidney disease) stage 3, GFR 30-59 ml/min (HCC) 07/06/2022   Atrial fibrillation (HCC) 10/25/2021   HFrEF (heart failure with reduced ejection fraction) (HCC) 01/19/2021   CAD (coronary artery disease) 10/11/2017   IDA (iron deficiency anemia) 08/31/2016   Seasonal and perennial allergic rhinitis 08/28/2016   Palpitations 09/07/2015   Cough, persistent 07/20/2015   Senile osteoporosis 06/09/2014   Hydronephrosis of right kidney 10/03/2013   Benign prostatic hyperplasia 09/24/2013   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   Dyslipidemia 04/06/2009  OSA (obstructive sleep apnea) 02/04/2009   Essential hypertension 09/02/2008   GERD (gastroesophageal reflux disease) 09/02/2008    ONSET DATE: Chronic  REFERRING DIAG: R29.6 (ICD-10-CM) - Recurrent falls  THERAPY DIAG:  Unsteadiness on feet  Frequent falls  Cervicalgia  Muscle weakness (generalized)  Abnormal posture  Other idiopathic scoliosis, cervicothoracic region  Rationale for Evaluation and Treatment: Rehabilitation  SUBJECTIVE:                                                                                                                                                                                             SUBJECTIVE  STATEMENT:  Patient states feels good today.  Denies any falls or med changes.  Still taking his coumadin  and getting regular INR checks.  Pt comes in due to falls. Has had history of falls for the last 3 years. Pt is on warfarin and goes to coumadin  clinic. Pt reports he normally falls forward. Has had PT in the past but has not been doing his exercises. Feels he has gotten weaker and not walking as far since stopping PT. Normally able to walk more around his neighborhood but currently only able to walk [place] 2-3 times. Recently got his rollator ~1 month ago Pt accompanied by: significant other, Brent Griffith (wife)  PERTINENT HISTORY: Wears thoracic/cervical brace at baseline. History of severe coronary artery disease with prior CABG. He has ischemic cardiomyopathy with a mildly reduced ejection fraction. He was diagnosed with atrial fibrillation in September 2022 and was started on amiodarone .   PAIN:  Are you having pain? No  PRECAUTIONS: Fall  RED FLAGS: None   WEIGHT BEARING RESTRICTIONS: No  FALLS: Has patient fallen in last 6 months? Yes. Number of falls at least 2 falls -- last fall on 08/23/23 slid off bed and hit head on his dresser  LIVING ENVIRONMENT: Lives with: lives with their spouse Lives in: House/apartment Stairs: 2 step ups to enter Has following equipment at home: Vannie - 4 wheeled  PLOF: Independent  PATIENT GOALS: Improve balance  OBJECTIVE:  Note: Objective measures were completed at Evaluation unless otherwise noted.  DIAGNOSTIC FINDINGS: CT cervical spine 08/23/23 IMPRESSION: 1. No acute traumatic injury identified in the cervical spine. 2. Chronic osteopenia, upper thoracic compression fractures with ankylosis and kyphosis.    COGNITION: Overall cognitive status: Within functional limits for tasks assessed   SENSATION: WFL  EDEMA:  None  MUSCLE TONE: Did not assess  DTRs:  Did not assess  POSTURE: Increased kyphosis. Wears neck and  thoracic brace  LOWER EXTREMITY ROM:     Active  Right Eval Left Eval  Hip flexion    Hip extension    Hip abduction    Hip adduction    Hip internal rotation    Hip external rotation    Knee flexion    Knee extension    Ankle dorsiflexion    Ankle plantarflexion    Ankle inversion    Ankle eversion     (Blank rows = not tested)  LOWER EXTREMITY MMT:    MMT Right Eval Left Eval RLE LLE  Hip flexion 5 4 5  4+  Hip extension 4- 3+    Hip abduction 3 3 3+ 4-  Hip adduction      Hip internal rotation      Hip external rotation      Knee flexion 5 5 5 5   Knee extension 5 4- 5 5  Ankle dorsiflexion   4- 4-  Ankle plantarflexion      Ankle inversion      Ankle eversion      (Blank rows = not tested)  BED MOBILITY:  SBA for safety  TRANSFERS: Sit to stand: Modified independence  Assistive device utilized: Environmental consultant - 4 wheeled     Stand to sit: Modified independence  Assistive device utilized: hands to bed      RAMP:  Not tested  CURB:  Not tested  STAIRS: Not tested GAIT: Findings: Distance walked: 180' and Comments: Decreased bilat step length, forward head  FUNCTIONAL TESTS:  5 times sit to stand: 13.22 sec using back of legs against bed Mini-BESTest: 14     PATIENT SURVEYS:  ABC scale: The Activities-Specific Balance Confidence (ABC) Scale 0% 10 20 30  40 50 60 70 80 90 100% No confidence<->completely confident  "How confident are you that you will not lose your balance or become unsteady when you . . .   Date tested 10/30/23  Walk around the house 90%  2. Walk up or down stairs 90%  3. Bend over and pick up a slipper from in front of a closet floor 90%  4. Reach for a small can off a shelf at eye level 80%  5. Stand on tip toes and reach for something above your head 90%  6. Stand on a chair and reach for something 0%  7. Sweep the floor 100%  8. Walk outside the house to a car parked in the driveway 80%  9. Get into or out of a car 100%  10.  Walk across a parking lot to the mall 100% w/ walker  11. Walk up or down a ramp 80% w/ walker  12. Walk in a crowded mall where people rapidly walk past you 80%  13. Are bumped into by people as you walk through the mall 10%  14. Step onto or off of an escalator while you are holding onto the railing 0%  15. Step onto or off an escalator while holding onto parcels such that you cannot hold onto the railing 0%  16. Walk outside on icy sidewalks 0%  Total: #/16 61.87%  TREATMENT DATE:  11/27/23 THERAPEUTIC EXERCISE: To improve strength.  Demonstration, verbal and tactile cues throughout for technique. Nustep L4 x 6'  THERAPEUTIC ACTIVITIES: To improve functional performance.  Demonstration, verbal and tactile cues throughout for technique. Corner pec stretch at 90 x 30 sec x 3 Lateral sidebending stretch with arm up wall x 30 sec x 2 bilaterally Prone lying x 3' --tolerates surprisingly well Supine bridging x 20 Supine scapular retractionx 20 BUE Supine B shoulder flexion stretch w/ dowel x 1'  NEUROMUSCULAR RE-EDUCATION: To improve balance. Tandem stance x 1' BLE Sit to stand x 10 Square/box stepping CW x 5;  CCW x 5 Sidestepping x 6 laps at mat table, no UE support, GTB at knees Toe/heel raises no support x 20 BLE    11/25/23 Nustep L5x70min TUG - 14.13 sec Sit to stand x 10  Standing ant/post staggered WS x 10 Step over pool noodle 2x10 fwd Lateral step over pool noodle 2x10 Step ups 4' x 20 BLE  11/19/23 THERAPEUTIC ACTIVITIES: To improve functional performance.  Demonstration, verbal and tactile cues throughout for technique. 6 min walk test- 670' Nustep L5x79min UE/LE Seated LAQ 1lb x 10 BLE Seated march 1lb x 10 BLE Standing heel toe strikes x 10 Heel raises x 10 Mini squats x 10  11/12/23 THERAPEUTIC EXERCISE: To improve strength.   Demonstration, verbal and tactile cues throughout for technique. NuStep L5 x 5'  THERAPEUTIC ACTIVITIES: To improve functional performance.  Demonstration, verbal and tactile cues throughout for technique. Counter back extension x 2/10 Doorway pec stretch x 1' x 2 BUE Standing Toe/heel rocking x 20 BLE Heel walking and toe walking at counter x 3 laps STS arms crossed x 2/5 Counter lunges x 10 BLE  NEUROMUSCULAR RE-EDUCATION: To improve balance. Standing Step touch 8 step with manual wt shifting to the L Step stance on LLE x 1' with manual wt shifts to the L L pelvic sideglides at counter x 10 L sidebending at counter reaching RUE overhead x 10 QC step overs 2/10 F/B AND S/S  11/04/23  THERAPEUTIC EXERCISE: To improve strength, ROM, and flexibility.  Demonstration, verbal and tactile cues throughout for technique. UBE L1 4.5' Backward  THERAPEUTIC ACTIVITIES: To improve functional performance.  Demonstration, verbal and tactile cues throughout for technique.  Counter HEP review: Standing toe raises/heel raise rocking f/b Standing hip abd Sit to stand arms crossed 2/5 4 step ups x 10 BLE 4 step touch x 10 BLE Forward lunges x 10 BLE    NEUROMUSCULAR RE-EDUCATION: To improve balance, posture, and proprioception. SLS with min UE counter support x 10 sec x 5 BLE Step overs with QC x 10 f/b and s/s   Added: Standing back extension Standing step stance with wt shifting f/b x 10 BLE  10/30/23 See HEP below    PATIENT EDUCATION: Education details: Exam findings, POC Person educated: Patient Education method: Explanation and Demonstration Education comprehension: verbalized understanding, returned demonstration, and needs further education  HOME EXERCISE PROGRAM: Did not yet initiate new HEP. Prior code from last PT episode is below Access Code: [email or web address] URL: [email or web address] Date: 10/30/2023 Prepared by: Gellen April Earnie Starring  Exercises -  Sit to Stand with Arms Crossed  - 1 x daily - 7 x weekly - 2 sets - 5 reps - Heel Raises with Counter Support  - 1 x daily - 7 x weekly - 1 sets - 10 reps - Alternating Step Taps with Counter Support  - 1 x daily - 7  x weekly - 1 sets - 10 reps - Forward Step Over with Counter Support  - 1 x daily - 7 x weekly - 1 sets - 10 reps - Push-Up on Counter  - 1 x daily - 7 x weekly - 1 sets - 10 reps - Standing Bent Over Single Arm Scapular Row with Table Support with PLB  - 1 x daily - 7 x weekly - 1 sets - 10 reps - stomping  - 1 x daily - 7 x weekly - 1 sets - 10 reps - Forward Step Up with Counter Support  - 1 x daily - 7 x weekly - 1 sets - 10 reps - Seated Shoulder Row with Anchored Resistance  - 1 x daily - 7 x weekly - 2 sets - 10 reps - Standing Hip Abduction with Counter Support  - 1 x daily - 7 x weekly - 1 sets - 10 reps - Single Leg Stance with Support  - 1 x daily - 7 x weekly - 1 sets - 5 reps - Lunge with Counter Support  - 1 x daily - 7 x weekly - 1 sets - 5 reps  GOALS: Goals reviewed with patient? Yes  SHORT TERM GOALS: Target date: 11/27/2023   Pt will be ind with initial HEP Baseline: Goal status: MET  2.  Pt will have improved TUG to </=13 sec Baseline: 15.03 Goal status: PROGRESSING- 14.13 sec (11/25/23)  3.  PT will obtain 6 min walk test baseline Baseline:  Goal status: MET- 7/15 25   LONG TERM GOALS: Target date: 12/25/2023   Pt will be ind with management and progression of HEP Baseline:  Goal status: INITIAL  2.  Pt will have improved mini BESTest by 4 points to demo MCID Baseline: 14 Goal status: INITIAL  3.  Pt will demo at least 4/5 bilat LE strength for increased strength and stability Baseline:  Goal status: PROGRESSING; 11/27/23 hip abductors are improved from 3 to 3+ LLE and 3 to 4- on RLE  4.  Pt will have improved ABC score to >/=67% to be better than cut-off score for balance impairments Baseline: 61.87% Goal status: INITIAL  5.  Pt will  have improved 6 min walk test by at least 160' to demo MCID for increased endurance Baseline:  Goal status: INITIAL    ASSESSMENT:  CLINICAL IMPRESSION: Patient has been seen x 6 PT visits for repeated falls and unsteady gait.   He is making good progress to goals, but has not yet attained the goals that we have set for him.  However, he has good rehab potential to meet goals.   TUG has improved from 15 to 14.13 sec reducing his fall risk.  Strength in his legs is improving (see MMT tables above).  It is a little soon to redo his outcome questionairres so this will be done on the 10th visit.   However, insurance company is requesting a progress update so that is the purpose of this note.   Further PT is necessary for gait, balance, strength, HEP, safety deficits.   Added thoracic and hip extension stretching and strengthening today since his severe kyphosis is a limiting factor with balance.   Continue per POC.    Patient is a 88 y.o. M who was seen today for physical therapy evaluation and treatment for falls. PMH is significant for multiple falls, CAD with prior CABG, and increased kyphosis. Assessment demos general weakness, abnormal posture, decreased endurance and high fall  risk with history of multiple falls. Pt will benefit from PT to address these deficits to maximize his level of safety.    OBJECTIVE IMPAIRMENTS: Abnormal gait, decreased activity tolerance, decreased balance, decreased endurance, decreased mobility, difficulty walking, decreased strength, improper body mechanics, and postural dysfunction.   ACTIVITY LIMITATIONS: standing, squatting, stairs, transfers, and locomotion level  PARTICIPATION LIMITATIONS: cleaning, shopping, and community activity  PERSONAL FACTORS: Age, Fitness, Past/current experiences, and Time since onset of injury/illness/exacerbation are also affecting patient's functional outcome.   REHAB POTENTIAL: Good  CLINICAL DECISION MAKING: Evolving/moderate  complexity  EVALUATION COMPLEXITY: Moderate  PLAN:  PT FREQUENCY: 2x/week  PT DURATION: 10 weeks  PLANNED INTERVENTIONS: [id number]- PT Re-evaluation, 97750- Physical Performance Testing, 97110-Therapeutic exercises, 97530- Therapeutic activity, 97112- Neuromuscular re-education, 97535- Self Care, [id number]- Manual therapy, [phone]- Gait training, [phone]- Aquatic Therapy, [phone]- Electrical stimulation (unattended), [id number]- Ultrasound, [id number]- Ionotophoresis 4mg /ml Dexamethasone , [id number] (1-2 muscles), [id number] (3+ muscles)- Dry Needling, Patient/Family education, Balance training, Stair training, Taping, Joint mobilization, Cryotherapy, and Moist heat  PLAN FOR NEXT SESSION: Continue to progress dynamic balance activities with focus on good weight shift to the LLE  Anel Creighton, PT 11/27/2023, 4:26 PM

## 2023-12-02 ENCOUNTER — Ambulatory Visit

## 2023-12-05 ENCOUNTER — Ambulatory Visit: Admitting: Rehabilitation

## 2023-12-05 ENCOUNTER — Encounter: Payer: Self-pay | Admitting: Rehabilitation

## 2023-12-05 DIAGNOSIS — R293 Abnormal posture: Secondary | ICD-10-CM

## 2023-12-05 DIAGNOSIS — M6281 Muscle weakness (generalized): Secondary | ICD-10-CM

## 2023-12-05 DIAGNOSIS — M4123 Other idiopathic scoliosis, cervicothoracic region: Secondary | ICD-10-CM

## 2023-12-05 DIAGNOSIS — R296 Repeated falls: Secondary | ICD-10-CM

## 2023-12-05 DIAGNOSIS — M542 Cervicalgia: Secondary | ICD-10-CM

## 2023-12-05 DIAGNOSIS — R2681 Unsteadiness on feet: Secondary | ICD-10-CM | POA: Diagnosis not present

## 2023-12-05 NOTE — Therapy (Signed)
 OUTPATIENT PHYSICAL THERAPY NEURO TREATMENT   Patient Name: RAMONA SLINGER MRN: [id number] DOB:1935-08-12, 88 y.o., male Today's Date: 12/05/2023   PCP: [name], NP REFERRING PROVIDER: [name], NP  END OF SESSION:  PT End of Session - 12/05/23 1507     Visit Number 7    Number of Visits 16    Date for PT Re-Evaluation 12/25/23    Authorization Type [employer]    Authorization Time Period Auth requested 10/30/23    Progress Note Due on Visit 10    PT Start Time 1443    PT Stop Time 1530    PT Time Calculation (min) 47 min    Activity Tolerance Patient tolerated treatment well    Behavior During Therapy WFL for tasks assessed/performed            Past Medical History:  Diagnosis Date   Acute gastric ulcer without mention of hemorrhage, perforation, or obstruction    Allergic rhinitis, cause unspecified    Anemia, unspecified    Arthritis    Asthma    Blood transfusion without reported diagnosis    CAD (coronary artery disease)    Diaphragmatic hernia without mention of obstruction or gangrene    Elevated prostate specific antigen (PSA)    Enlarged prostate    Esophageal reflux    Extrinsic asthma, unspecified    Herpes zoster without mention of complication    Hyperlipidemia    Hypersomnia with sleep apnea, unspecified    Impotence of organic origin    Kyphosis    Lumbago    Obstructive sleep apnea (adult) (pediatric)    Pneumonia    hx of years ago    Senile osteoporosis    Trigger finger (acquired)    Tubular adenoma of colon 05/2013   Unspecified essential hypertension    Unspecified sleep apnea    CPAP- settings 4-12    Unspecified vitamin D deficiency    Past Surgical History:  Procedure Laterality Date   CARDIAC CATHETERIZATION  03/04/2009   Dr Kerrin   COLONOSCOPY     CORONARY ARTERY BYPASS GRAFT  2010   CYSTOSCOPY WITH RETROGRADE PYELOGRAM, URETEROSCOPY AND STENT PLACEMENT Right 10/04/2013   Procedure: CYSTOSCOPY  WITH RETROGRADE PYELOGRAM,  AND STENT PLACEMENT;  Surgeon: Donnice Brooks, MD;  Location: [facility];  Service: Urology;  Laterality: Right;   DENTAL SURGERY N/A    MOLE REMOVAL  1958   chin   ROTATOR CUFF REPAIR Left 04/1998   with bone spur removed, Dr Shari   TONSILLECTOMY  1945   TRANSURETHRAL RESECTION OF PROSTATE N/A 09/24/2013   Procedure: TRANSURETHRAL RESECTION OF THE PROSTATE (TURP) WITH GYRUS (STAGED RIGHT LATERAL AND MEDIAN LOBE);  Surgeon: Arlena LILLETTE Gal, MD;  Location: [facility];  Service: Urology;  Laterality: N/A;   UPPER GI ENDOSCOPY     Patient Active Problem List   Diagnosis Date Noted   Vitamin D deficiency 09/19/2023   Recurrent falls 09/19/2023   CKD (chronic kidney disease) stage 3, GFR 30-59 ml/min (HCC) 07/06/2022   Atrial fibrillation (HCC) 10/25/2021   HFrEF (heart failure with reduced ejection fraction) (HCC) 01/19/2021   CAD (coronary artery disease) 10/11/2017   IDA (iron deficiency anemia) 08/31/2016   Seasonal and perennial allergic rhinitis 08/28/2016   Palpitations 09/07/2015   Cough, persistent 07/20/2015   Senile osteoporosis 06/09/2014   Hydronephrosis of right kidney 10/03/2013   Benign prostatic hyperplasia 09/24/2013   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   Dyslipidemia 04/06/2009  OSA (obstructive sleep apnea) 02/04/2009   Essential hypertension 09/02/2008   GERD (gastroesophageal reflux disease) 09/02/2008    ONSET DATE: Chronic  REFERRING DIAG: R29.6 (ICD-10-CM) - Recurrent falls  THERAPY DIAG:  Unsteadiness on feet  Frequent falls  Cervicalgia  Muscle weakness (generalized)  Abnormal posture  Other idiopathic scoliosis, cervicothoracic region  Rationale for Evaluation and Treatment: Rehabilitation  SUBJECTIVE:                                                                                                                                                                                             SUBJECTIVE STATEMENT:  12/05/23   States feels fine.  Denies any pain or falls since last visit.  States he is doing his HEP.   Encouraged him to work on his flexibility for his back and hips as much as he does his balance  Pt comes in due to falls. Has had history of falls for the last 3 years. Pt is on warfarin and goes to coumadin  clinic. Pt reports he normally falls forward. Has had PT in the past but has not been doing his exercises. Feels he has gotten weaker and not walking as far since stopping PT. Normally able to walk more around his neighborhood but currently only able to walk [place] 2-3 times. Recently got his rollator ~1 month ago Pt accompanied by: significant other, [name] (wife)  PERTINENT HISTORY: Wears thoracic/cervical brace at baseline. History of severe coronary artery disease with prior CABG. He has ischemic cardiomyopathy with a mildly reduced ejection fraction. He was diagnosed with atrial fibrillation in September 2022 and was started on amiodarone .   PAIN:  Are you having pain? No  PRECAUTIONS: Fall  RED FLAGS: None   WEIGHT BEARING RESTRICTIONS: No  FALLS: Has patient fallen in last 6 months? Yes. Number of falls at least 2 falls -- last fall on 08/23/23 slid off bed and hit head on his dresser  LIVING ENVIRONMENT: Lives with: lives with their spouse Lives in: House/apartment Stairs: 2 step ups to enter Has following equipment at home: Vannie - 4 wheeled  PLOF: Independent  PATIENT GOALS: Improve balance  OBJECTIVE:  Note: Objective measures were completed at Evaluation unless otherwise noted.  DIAGNOSTIC FINDINGS: CT cervical spine 08/23/23 IMPRESSION: 1. No acute traumatic injury identified in the cervical spine. 2. Chronic osteopenia, upper thoracic compression fractures with ankylosis and kyphosis.    COGNITION: Overall cognitive status: Within functional limits for tasks assessed   SENSATION: WFL  EDEMA:  None  MUSCLE TONE: Did not assess  DTRs:  Did not  assess  POSTURE: Increased  kyphosis. Wears neck and thoracic brace  LOWER EXTREMITY ROM:     Active  Right Eval Left Eval  Hip flexion    Hip extension    Hip abduction    Hip adduction    Hip internal rotation    Hip external rotation    Knee flexion    Knee extension    Ankle dorsiflexion    Ankle plantarflexion    Ankle inversion    Ankle eversion     (Blank rows = not tested)  LOWER EXTREMITY MMT:    MMT Right Eval Left Eval RLE LLE  Hip flexion 5 4 5  4+  Hip extension 4- 3+    Hip abduction 3 3 3+ 4-  Hip adduction      Hip internal rotation      Hip external rotation      Knee flexion 5 5 5 5   Knee extension 5 4- 5 5  Ankle dorsiflexion   4- 4-  Ankle plantarflexion      Ankle inversion      Ankle eversion      (Blank rows = not tested)  BED MOBILITY:  SBA for safety  TRANSFERS: Sit to stand: Modified independence  Assistive device utilized: Environmental consultant - 4 wheeled     Stand to sit: Modified independence  Assistive device utilized: hands to bed      RAMP:  Not tested  CURB:  Not tested  STAIRS: Not tested GAIT: Findings: Distance walked: 180' and Comments: Decreased bilat step length, forward head  FUNCTIONAL TESTS:  5 times sit to stand: 13.22 sec using back of legs against bed Mini-BESTest: 14     PATIENT SURVEYS:  ABC scale: The Activities-Specific Balance Confidence (ABC) Scale 0% 10 20 30  40 50 60 70 80 90 100% No confidence<->completely confident  "How confident are you that you will not lose your balance or become unsteady when you . . .   Date tested 10/30/23  Walk around the house 90%  2. Walk up or down stairs 90%  3. Bend over and pick up a slipper from in front of a closet floor 90%  4. Reach for a small can off a shelf at eye level 80%  5. Stand on tip toes and reach for something above your head 90%  6. Stand on a chair and reach for something 0%  7. Sweep the floor 100%  8. Walk outside the house to a car parked in the  driveway 80%  9. Get into or out of a car 100%  10. Walk across a parking lot to the mall 100% w/ walker  11. Walk up or down a ramp 80% w/ walker  12. Walk in a crowded mall where people rapidly walk past you 80%  13. Are bumped into by people as you walk through the mall 10%  14. Step onto or off of an escalator while you are holding onto the railing 0%  15. Step onto or off an escalator while holding onto parcels such that you cannot hold onto the railing 0%  16. Walk outside on icy sidewalks 0%  Total: #/16 61.87%  TREATMENT DATE:  12/05/23 THERAPEUTIC EXERCISE: To improve strength.  Demonstration, verbal and tactile cues throughout for technique. Nustep L4 x 6'  THERAPEUTIC ACTIVITIES: To improve functional performance.  Demonstration, verbal and tactile cues throughout for technique. Corner pec stretch at 90 x 30 sec x 3 Counter back ext x 10  Seated ham stretch x 1' x 3 BLE Prone lying x 3'  Prone press up on elbows x 10 Supine cervical and scapular retractionx 20 BUE  NEUROMUSCULAR RE-EDUCATION: To improve balance. Box stepping x 10 laps CW; x 10 CCW; diagonal steps at PVC cross x 5 L and x5 R Pool noodle step over and lunge x 2/8 BLE Toe/heel raises unsupported x 30   11/27/23 THERAPEUTIC EXERCISE: To improve strength.  Demonstration, verbal and tactile cues throughout for technique. Nustep L4 x 6'  THERAPEUTIC ACTIVITIES: To improve functional performance.  Demonstration, verbal and tactile cues throughout for technique. Corner pec stretch at 90 x 30 sec x 3 Lateral sidebending stretch with arm up wall x 30 sec x 2 bilaterally Prone lying x 3' --tolerates surprisingly well Supine bridging x 20 Supine scapular retractionx 20 BUE Supine B shoulder flexion stretch w/ dowel x 1'  NEUROMUSCULAR RE-EDUCATION: To improve balance. Tandem stance x  1' BLE Sit to stand x 10 Square/box stepping CW x 5;  CCW x 5 Sidestepping x 6 laps at mat table, no UE support, GTB at knees Toe/heel raises no support x 20 BLE  11/25/23 Nustep L5x18min TUG - 14.13 sec Sit to stand x 10  Standing ant/post staggered WS x 10 Step over pool noodle 2x10 fwd Lateral step over pool noodle 2x10 Step ups 4' x 20 BLE  11/19/23 THERAPEUTIC ACTIVITIES: To improve functional performance.  Demonstration, verbal and tactile cues throughout for technique. 6 min walk test- 670' Nustep L5x77min UE/LE Seated LAQ 1lb x 10 BLE Seated march 1lb x 10 BLE Standing heel toe strikes x 10 Heel raises x 10 Mini squats x 10  11/12/23 THERAPEUTIC EXERCISE: To improve strength.  Demonstration, verbal and tactile cues throughout for technique. NuStep L5 x 5'  THERAPEUTIC ACTIVITIES: To improve functional performance.  Demonstration, verbal and tactile cues throughout for technique. Counter back extension x 2/10 Doorway pec stretch x 1' x 2 BUE Standing Toe/heel rocking x 20 BLE Heel walking and toe walking at counter x 3 laps STS arms crossed x 2/5 Counter lunges x 10 BLE  NEUROMUSCULAR RE-EDUCATION: To improve balance. Standing Step touch 8 step with manual wt shifting to the L Step stance on LLE x 1' with manual wt shifts to the L L pelvic sideglides at counter x 10 L sidebending at counter reaching RUE overhead x 10 QC step overs 2/10 F/B AND S/S  11/04/23  THERAPEUTIC EXERCISE: To improve strength, ROM, and flexibility.  Demonstration, verbal and tactile cues throughout for technique. UBE L1 4.5' Backward  THERAPEUTIC ACTIVITIES: To improve functional performance.  Demonstration, verbal and tactile cues throughout for technique.  Counter HEP review: Standing toe raises/heel raise rocking f/b Standing hip abd Sit to stand arms crossed 2/5 4 step ups x 10 BLE 4 step touch x 10 BLE Forward lunges x 10 BLE    NEUROMUSCULAR RE-EDUCATION: To improve  balance, posture, and proprioception. SLS with min UE counter support x 10 sec x 5 BLE Step overs with QC x 10 f/b and s/s   Added: Standing back extension Standing step stance with wt shifting f/b x 10 BLE  10/30/23 See HEP below  PATIENT EDUCATION: Education details: Exam findings, POC Person educated: Patient Education method: Medical illustrator Education comprehension: verbalized understanding, returned demonstration, and needs further education  HOME EXERCISE PROGRAM: Did not yet initiate new HEP. Prior code from last PT episode is below Access Code: [email or web address] URL: [email or web address] Date: 10/30/2023 Prepared by: Gellen April Earnie Starring  Exercises - Sit to Stand with Arms Crossed  - 1 x daily - 7 x weekly - 2 sets - 5 reps - Heel Raises with Counter Support  - 1 x daily - 7 x weekly - 1 sets - 10 reps - Alternating Step Taps with Counter Support  - 1 x daily - 7 x weekly - 1 sets - 10 reps - Forward Step Over with Counter Support  - 1 x daily - 7 x weekly - 1 sets - 10 reps - Push-Up on Counter  - 1 x daily - 7 x weekly - 1 sets - 10 reps - Standing Bent Over Single Arm Scapular Row with Table Support with PLB  - 1 x daily - 7 x weekly - 1 sets - 10 reps - stomping  - 1 x daily - 7 x weekly - 1 sets - 10 reps - Forward Step Up with Counter Support  - 1 x daily - 7 x weekly - 1 sets - 10 reps - Seated Shoulder Row with Anchored Resistance  - 1 x daily - 7 x weekly - 2 sets - 10 reps - Standing Hip Abduction with Counter Support  - 1 x daily - 7 x weekly - 1 sets - 10 reps - Single Leg Stance with Support  - 1 x daily - 7 x weekly - 1 sets - 5 reps - Lunge with Counter Support  - 1 x daily - 7 x weekly - 1 sets - 5 reps  GOALS: Goals reviewed with patient? Yes  SHORT TERM GOALS: Target date: 11/27/2023   Pt will be ind with initial HEP Baseline: Goal status: MET  2.  Pt will have improved TUG to </=13 sec Baseline: 15.03 Goal status:  PROGRESSING- 14.13 sec (11/25/23)  3.  PT will obtain 6 min walk test baseline Baseline:  Goal status: MET- 7/15 25   LONG TERM GOALS: Target date: 12/25/2023   Pt will be ind with management and progression of HEP Baseline:  Goal status: INITIAL  2.  Pt will have improved mini BESTest by 4 points to demo MCID Baseline: 14 Goal status: INITIAL  3.  Pt will demo at least 4/5 bilat LE strength for increased strength and stability Baseline:  Goal status: PROGRESSING; 11/27/23 hip abductors are improved from 3 to 3+ LLE and 3 to 4- on RLE  4.  Pt will have improved ABC score to >/=67% to be better than cut-off score for balance impairments Baseline: 61.87% Goal status: INITIAL  5.  Pt will have improved 6 min walk test by at least 160' to demo MCID for increased endurance Baseline:  Goal status: INITIAL    ASSESSMENT:  CLINICAL IMPRESSION: Patient has been seen x 6 PT visits for repeated falls and unsteady gait.   He is making good progress to goals, but has not yet attained the goals that we have set for him.  However, he has good rehab potential to meet goals.   TUG has improved from 15 to 14.13 sec reducing his fall risk.  Strength in his legs is improving (see MMT tables above).  It is a little soon to  redo his outcome questionairres so this will be done on the 10th visit.   However, insurance company is requesting a progress update so that is the purpose of this note.   Further PT is necessary for gait, balance, strength, HEP, safety deficits.   Added thoracic and hip extension stretching and strengthening today since his severe kyphosis is a limiting factor with balance.   Continue per POC.    Patient is a 88 y.o. M who was seen today for physical therapy evaluation and treatment for falls. PMH is significant for multiple falls, CAD with prior CABG, and increased kyphosis. Assessment demos general weakness, abnormal posture, decreased endurance and high fall risk with history of  multiple falls. Pt will benefit from PT to address these deficits to maximize his level of safety.    OBJECTIVE IMPAIRMENTS: Abnormal gait, decreased activity tolerance, decreased balance, decreased endurance, decreased mobility, difficulty walking, decreased strength, improper body mechanics, and postural dysfunction.   ACTIVITY LIMITATIONS: standing, squatting, stairs, transfers, and locomotion level  PARTICIPATION LIMITATIONS: cleaning, shopping, and community activity  PERSONAL FACTORS: Age, Fitness, Past/current experiences, and Time since onset of injury/illness/exacerbation are also affecting patient's functional outcome.   REHAB POTENTIAL: Good  CLINICAL DECISION MAKING: Evolving/moderate complexity  EVALUATION COMPLEXITY: Moderate  PLAN:  PT FREQUENCY: 2x/week  PT DURATION: 10 weeks  PLANNED INTERVENTIONS: 97164- PT Re-evaluation, 97750- Physical Performance Testing, 97110-Therapeutic exercises, 97530- Therapeutic activity, 97112- Neuromuscular re-education, 97535- Self Care, 02859- Manual therapy, [phone]- Gait training, [phone]- Aquatic Therapy, [phone]- Electrical stimulation (unattended), [id number]- Ultrasound, [id number]- Ionotophoresis 4mg /ml Dexamethasone , 79439 (1-2 muscles), 20561 (3+ muscles)- Dry Needling, Patient/Family education, Balance training, Stair training, Taping, Joint mobilization, Cryotherapy, and Moist heat  PLAN FOR NEXT SESSION: Progress dynamic balance on LLE, L trunk/hip stability in SLS  Mervil Wacker, PT 12/05/2023, 5:23 PM

## 2023-12-06 ENCOUNTER — Ambulatory Visit: Attending: Cardiology | Admitting: *Deleted

## 2023-12-06 DIAGNOSIS — Z5181 Encounter for therapeutic drug level monitoring: Secondary | ICD-10-CM | POA: Diagnosis not present

## 2023-12-06 DIAGNOSIS — I4891 Unspecified atrial fibrillation: Secondary | ICD-10-CM

## 2023-12-06 LAB — POCT INR: INR: 3.4 — AB (ref 2.0–3.0)

## 2023-12-06 NOTE — Progress Notes (Signed)
 INR 3.4  Please see anticoagulation encounter

## 2023-12-06 NOTE — Patient Instructions (Addendum)
 Description   Do not take any warfarin tomorrow then START taking 1/2 tablet daily, EXCEPT 1 tablet on Fridays.  Stay consistent with boost/ensure (Mon, Wed, and Fri)  Recheck INR in 3 weeks. Coumadin  Clinic [phone]

## 2023-12-10 NOTE — Progress Notes (Signed)
 [facility] clinic  Provider:  Jereld Serum DNP  Code Status:  Full Code  Goals of Care:     11/15/2023    2:16 PM  Advanced Directives  Does Patient Have a Medical Advance Directive? No     Chief Complaint  Patient presents with   referral for dermatologist    Discussed the use of AI scribe software for clinical note transcription with the patient, who gave verbal consent to proceed.  HPI: Patient is a 88 y.o. male seen today for an acute visit for warts on fingers. He was accompanied by his wife.  He has multiple warts on his fingers, including two on the left middle finger, two on the left pointer finger, one on the right pinky, and one on the right pointer finger. The warts are not painful but have been persistent. He has tried using Band-Aids designed for wart removal, which provided temporary relief, but the warts reappeared.  He has a history of hypertension and is currently taking losartan  25 mg daily and carvedilol  3.125 mg twice a day. He does not regularly check his blood pressure at home. He reports no symptoms of dizziness.  He has a history of atrial fibrillation and is on Coumadin , taking 1.25 mg daily except on Fridays when he takes 2.5 mg. Additionally, he takes amiodarone  200 mg daily.  He has benign prostatic hyperplasia (BPH) and is taking Cardura  2 mg daily and finasteride  5 mg daily. Increasing his water intake has alleviated some urinary symptoms.  He has kyphosis and uses a back brace for support. He also notes experiencing some memory problems.  No dizziness or pain from the warts.    Past Medical History:  Diagnosis Date   Acute gastric ulcer without mention of hemorrhage, perforation, or obstruction    Allergic rhinitis, cause unspecified    Anemia, unspecified    Arthritis    Asthma    Blood transfusion without reported diagnosis    CAD (coronary artery disease)    Diaphragmatic hernia without mention of obstruction or gangrene    Elevated  prostate specific antigen (PSA)    Enlarged prostate    Esophageal reflux    Extrinsic asthma, unspecified    Herpes zoster without mention of complication    Hyperlipidemia    Hypersomnia with sleep apnea, unspecified    Impotence of organic origin    Kyphosis    Lumbago    Obstructive sleep apnea (adult) (pediatric)    Pneumonia    hx of years ago    Senile osteoporosis    Trigger finger (acquired)    Tubular adenoma of colon 05/2013   Unspecified essential hypertension    Unspecified sleep apnea    CPAP- settings 4-12    Unspecified vitamin D deficiency     Past Surgical History:  Procedure Laterality Date   CARDIAC CATHETERIZATION  03/04/2009   Dr Kerrin   COLONOSCOPY     CORONARY ARTERY BYPASS GRAFT  2010   CYSTOSCOPY WITH RETROGRADE PYELOGRAM, URETEROSCOPY AND STENT PLACEMENT Right 10/04/2013   Procedure: CYSTOSCOPY WITH RETROGRADE PYELOGRAM,  AND STENT PLACEMENT;  Surgeon: Donnice Brooks, MD;  Location: [facility];  Service: Urology;  Laterality: Right;   DENTAL SURGERY N/A    MOLE REMOVAL  1958   chin   ROTATOR CUFF REPAIR Left 04/1998   with bone spur removed, Dr Shari   TONSILLECTOMY  1945   TRANSURETHRAL RESECTION OF PROSTATE N/A 09/24/2013   Procedure: TRANSURETHRAL RESECTION OF THE PROSTATE (TURP) WITH  GYRUS (STAGED RIGHT LATERAL AND MEDIAN LOBE);  Surgeon: Arlena LILLETTE Gal, MD;  Location: [facility];  Service: Urology;  Laterality: N/A;   UPPER GI ENDOSCOPY      Allergies  Allergen Reactions   Compazine [Prochlorperazine Edisylate] Anxiety    Outpatient Encounter Medications as of 12/11/2023  Medication Sig   albuterol  (VENTOLIN  HFA) 108 (90 Base) MCG/ACT inhaler Inhale 1-2 puffs into the lungs every 6 (six) hours as needed for wheezing or shortness of breath.   alum & mag hydroxide-simeth (MAALOX MAX) 400-400-40 MG/5ML suspension Take 10 mLs by mouth every 6 (six) hours as needed (pain with swallowing and reflux).   amiodarone  (PACERONE ) 200 MG  tablet Take 1 tablet (200 mg total) by mouth daily.   Ascorbic Acid (VITAMIN C) 1000 MG tablet Take 1,000 mg by mouth daily.   Calcium  Citrate-Vitamin D (EQ CALCIUM  CITRATE+D3 PO) Take 1,200 mg by mouth daily.   carvedilol  (COREG ) 3.125 MG tablet TAKE 1 TABLET BY MOUTH TWICE  DAILY   Coenzyme Q10 (COQ10) 200 MG CAPS Take 200 mg by mouth daily.   doxazosin  (CARDURA ) 2 MG tablet TAKE 1 TABLET BY MOUTH DAILY   empagliflozin  (JARDIANCE ) 10 MG TABS tablet Take 1 tablet (10 mg total) by mouth daily before breakfast.   famotidine  (PEPCID ) 20 MG tablet TAKE 1 TABLET BY MOUTH TWICE  DAILY   finasteride  (PROSCAR ) 5 MG tablet TAKE 1 TABLET BY MOUTH IN  THE MORNING   fluticasone  (FLONASE ) 50 MCG/ACT nasal spray Place 1 spray into both nostrils daily.   furosemide  (LASIX ) 20 MG tablet TAKE 1 TABLET BY MOUTH DAILY AS  NEEDED FOR LEG SWELLING   Iron, Ferrous Sulfate, 325 (65 Fe) MG TABS Take 325 mg by mouth 2 (two) times daily.   lactose free nutrition (BOOST) LIQD Take 237 mLs by mouth every Monday, Wednesday, and Friday.   mexiletine (MEXITIL) 150 MG capsule Take 1 capsule (150 mg total) by mouth 2 (two) times daily.   pantoprazole  (PROTONIX ) 40 MG tablet TAKE 1 TABLET BY MOUTH DAILY   rosuvastatin  (CRESTOR ) 20 MG tablet TAKE 1 TABLET BY MOUTH ONCE  DAILY FOR CHOLESTEROL   warfarin (COUMADIN ) 2.5 MG tablet TAKE 1/2 TO 1 TABLET BY MOUTH  DAILY AS DIRECTED BY THE  COUMADIN  CLINIC   [DISCONTINUED] losartan  (COZAAR ) 25 MG tablet TAKE 1 TABLET BY MOUTH DAILY  PLEASE KEEP SCHEDULED  APPOINTMENT WITH CARDIOLOGIST   losartan  (COZAAR ) 25 MG tablet Take 0.5 tablets (12.5 mg total) by mouth daily.   No facility-administered encounter medications on file as of 12/11/2023.    Review of Systems:  Review of Systems  Constitutional:  Negative for activity change, appetite change and fever.  HENT:  Negative for sore throat.   Eyes: Negative.   Cardiovascular:  Negative for chest pain and leg swelling.   Gastrointestinal:  Negative for abdominal distention, diarrhea and vomiting.  Genitourinary:  Negative for dysuria, frequency and urgency.  Skin:  Negative for color change.  Neurological:  Negative for dizziness and headaches.  Psychiatric/Behavioral:  Negative for behavioral problems and sleep disturbance. The patient is not nervous/anxious.     Health Maintenance  Topic Date Due   DTaP/Tdap/Td (3 - Td or Tdap) 07/24/2021   COVID-19 Vaccine (7 - Moderna risk 2024-25 season) 09/24/2023   INFLUENZA VACCINE  12/06/2023   Medicare Annual Wellness (AWV)  11/14/2024   Pneumococcal Vaccine: 50+ Years  Completed   Zoster Vaccines- Shingrix  Completed   Hepatitis B Vaccines  Aged Out  HPV VACCINES  Aged Out   Meningococcal B Vaccine  Aged Out   Colonoscopy  Discontinued    Physical Exam: Vitals:   12/11/23 0854 12/11/23 1430  BP: (!) 90/40 (!) 102/50  Pulse: 60   Temp: 97.7 F (36.5 C)   SpO2: 97%   Weight: 125 lb 12.8 oz (57.1 kg)   Height: 5' 2 (1.575 m)    Body mass index is 23.01 kg/m. Physical Exam Constitutional:      Appearance: Normal appearance.  HENT:     Head: Normocephalic and atraumatic.     Mouth/Throat:     Mouth: Mucous membranes are moist.  Eyes:     Conjunctiva/sclera: Conjunctivae normal.  Cardiovascular:     Rate and Rhythm: Normal rate and regular rhythm.     Pulses: Normal pulses.     Heart sounds: Normal heart sounds.  Pulmonary:     Effort: Pulmonary effort is normal.     Breath sounds: Normal breath sounds.  Abdominal:     General: Bowel sounds are normal.     Palpations: Abdomen is soft.  Musculoskeletal:        General: No swelling.     Cervical back: Normal range of motion.     Comments: kyphotic  Skin:    General: Skin is warm and dry.     Comments: Warts on left middle finger (2), left pointer (2) and  on the right pointer left pink (1)   Neurological:     General: No focal deficit present.     Mental Status: He is alert and  oriented to person, place, and time.  Psychiatric:        Mood and Affect: Mood normal.        Behavior: Behavior normal.        Thought Content: Thought content normal.        Judgment: Judgment normal.     Labs reviewed: Basic Metabolic Panel: Recent Labs    01/18/23 1450 08/23/23 0258 10/03/23 1436  NA 141 134* 140  K 4.3 4.2 4.1  CL 106 103 106  CO2 25 21* 20  GLUCOSE 50* 103* 91  BUN 29* 26* 21  CREATININE 1.09 1.24 1.07  CALCIUM  8.9 8.6* 8.7  TSH 0.957  --  1.490   Liver Function Tests: Recent Labs    01/18/23 1450 10/03/23 1436  AST 22 29  ALT 23 25  ALKPHOS 59 57  BILITOT 0.6 0.8  PROT 6.1 6.0  ALBUMIN 3.7 3.8   No results for input(s): LIPASE, AMYLASE in the last 8760 hours. No results for input(s): AMMONIA in the last 8760 hours. CBC: Recent Labs    01/18/23 1450 08/23/23 0258  WBC 4.7 6.9  NEUTROABS  --  5.6  HGB 11.9* 13.3  HCT 33.2* 38.8*  MCV 99* 90.7  PLT 198 156   Lipid Panel: Recent Labs    01/18/23 1450  CHOL 116  HDL 58  LDLCALC 46  TRIG 49  CHOLHDL 2.0   Lab Results  Component Value Date   HGBA1C 5.6 07/28/2012    Procedures since last visit: No results found.  Assessment/Plan  1. Wart of hand (Primary) -  Multiple warts on bilateral hands.  - Refer to dermatology for further evaluation and management. - Ambulatory referral to Dermatology  2. Atrial fibrillation, unspecified type (HCC) -  managed with Coumadin  and amiodarone . - Continue Coumadin  1.25 mg daily except Fridays, take 2.5 mg on Fridays. - Continue amiodarone  200 mg  daily.  3. Essential hypertension -  Initial BP reading likely inaccurate. Adjusted losartan  dosage to prevent hypotension. - Decrease losartan  to 12.5 mg daily. - Instructed to split current 25 mg tablets in half. - Monitor blood pressure at home regularly. - losartan  (COZAAR ) 25 MG tablet; Take 0.5 tablets (12.5 mg total) by mouth daily.  Dispense: 45 tablet; Refill: 3  4.  Benign prostatic hyperplasia, unspecified whether lower urinary tract symptoms present -  symptoms improved with current medication regimen. - Continue Cardura  2 mg daily. - Continue finasteride  5 mg daily.     Labs/tests ordered:   None   Return if symptoms worsen or fail to improve.  Brent Plagge Medina-Vargas, NP

## 2023-12-11 ENCOUNTER — Ambulatory Visit: Admitting: Adult Health

## 2023-12-11 ENCOUNTER — Encounter: Payer: Self-pay | Admitting: Adult Health

## 2023-12-11 ENCOUNTER — Encounter: Payer: Self-pay | Admitting: Cardiology

## 2023-12-11 VITALS — BP 102/50 | HR 60 | Temp 97.7°F | Ht 62.0 in | Wt 125.8 lb

## 2023-12-11 DIAGNOSIS — I4891 Unspecified atrial fibrillation: Secondary | ICD-10-CM | POA: Diagnosis not present

## 2023-12-11 DIAGNOSIS — B079 Viral wart, unspecified: Secondary | ICD-10-CM | POA: Diagnosis not present

## 2023-12-11 DIAGNOSIS — N400 Enlarged prostate without lower urinary tract symptoms: Secondary | ICD-10-CM | POA: Diagnosis not present

## 2023-12-11 DIAGNOSIS — I10 Essential (primary) hypertension: Secondary | ICD-10-CM

## 2023-12-11 MED ORDER — LOSARTAN POTASSIUM 25 MG PO TABS: 12.5000 mg | ORAL_TABLET | Freq: Every day | ORAL | 3 refills | Status: DC

## 2023-12-11 NOTE — Telephone Encounter (Signed)
 BP 102/50 at appointment today.  Note in EPIC

## 2023-12-12 ENCOUNTER — Ambulatory Visit: Attending: Nurse Practitioner | Admitting: Rehabilitation

## 2023-12-12 DIAGNOSIS — M6281 Muscle weakness (generalized): Secondary | ICD-10-CM | POA: Diagnosis not present

## 2023-12-12 DIAGNOSIS — R293 Abnormal posture: Secondary | ICD-10-CM | POA: Diagnosis not present

## 2023-12-12 DIAGNOSIS — M542 Cervicalgia: Secondary | ICD-10-CM | POA: Insufficient documentation

## 2023-12-12 DIAGNOSIS — M4123 Other idiopathic scoliosis, cervicothoracic region: Secondary | ICD-10-CM | POA: Insufficient documentation

## 2023-12-12 DIAGNOSIS — R296 Repeated falls: Secondary | ICD-10-CM | POA: Insufficient documentation

## 2023-12-12 DIAGNOSIS — R2681 Unsteadiness on feet: Secondary | ICD-10-CM | POA: Diagnosis not present

## 2023-12-12 NOTE — Therapy (Signed)
 OUTPATIENT PHYSICAL THERAPY NEURO TREATMENT   Patient Name: Brent Griffith MRN: [id number] DOB:02/09/1936, 88 y.o., male Today's Date: 12/12/2023   PCP: Brent Harlene POUR, NP REFERRING PROVIDER: [name], NP  END OF SESSION:  PT End of Session - 12/12/23 1456     Visit Number 8    Number of Visits 16    Date for PT Re-Evaluation 12/25/23    Authorization Type [employer] Medicare    Authorization Time Period Auth requested 10/30/23    Progress Note Due on Visit 10    Activity Tolerance Patient tolerated treatment well    Behavior During Therapy [facility] for tasks assessed/performed            Past Medical History:  Diagnosis Date   Acute gastric ulcer without mention of hemorrhage, perforation, or obstruction    Allergic rhinitis, cause unspecified    Anemia, unspecified    Arthritis    Asthma    Blood transfusion without reported diagnosis    CAD (coronary artery disease)    Diaphragmatic hernia without mention of obstruction or gangrene    Elevated prostate specific antigen (PSA)    Enlarged prostate    Esophageal reflux    Extrinsic asthma, unspecified    Herpes zoster without mention of complication    Hyperlipidemia    Hypersomnia with sleep apnea, unspecified    Impotence of organic origin    Kyphosis    Lumbago    Obstructive sleep apnea (adult) (pediatric)    Pneumonia    hx of years ago    Senile osteoporosis    Trigger finger (acquired)    Tubular adenoma of colon 05/2013   Unspecified essential hypertension    Unspecified sleep apnea    CPAP- settings 4-12    Unspecified vitamin D deficiency    Past Surgical History:  Procedure Laterality Date   CARDIAC CATHETERIZATION  03/04/2009   Dr Kerrin   COLONOSCOPY     CORONARY ARTERY BYPASS GRAFT  2010   CYSTOSCOPY WITH RETROGRADE PYELOGRAM, URETEROSCOPY AND STENT PLACEMENT Right 10/04/2013   Procedure: CYSTOSCOPY WITH RETROGRADE PYELOGRAM,  AND STENT PLACEMENT;  Surgeon: Donnice Brooks, MD;   Location: [facility];  Service: Urology;  Laterality: Right;   DENTAL SURGERY N/A    MOLE REMOVAL  1958   chin   ROTATOR CUFF REPAIR Left 04/1998   with bone spur removed, Dr Shari   TONSILLECTOMY  1945   TRANSURETHRAL RESECTION OF PROSTATE N/A 09/24/2013   Procedure: TRANSURETHRAL RESECTION OF THE PROSTATE (TURP) WITH GYRUS (STAGED RIGHT LATERAL AND MEDIAN LOBE);  Surgeon: Arlena LILLETTE Gal, MD;  Location: [facility];  Service: Urology;  Laterality: N/A;   UPPER GI ENDOSCOPY     Patient Active Problem List   Diagnosis Date Noted   Vitamin D deficiency 09/19/2023   Recurrent falls 09/19/2023   CKD (chronic kidney disease) stage 3, GFR 30-59 ml/min (HCC) 07/06/2022   Atrial fibrillation (HCC) 10/25/2021   HFrEF (heart failure with reduced ejection fraction) (HCC) 01/19/2021   CAD (coronary artery disease) 10/11/2017   IDA (iron deficiency anemia) 08/31/2016   Seasonal and perennial allergic rhinitis 08/28/2016   Palpitations 09/07/2015   Cough, persistent 07/20/2015   Senile osteoporosis 06/09/2014   Hydronephrosis of right kidney 10/03/2013   Benign prostatic hyperplasia 09/24/2013   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   Dyslipidemia 04/06/2009   OSA (obstructive sleep apnea) 02/04/2009   Essential hypertension 09/02/2008   GERD (gastroesophageal reflux disease) 09/02/2008    ONSET DATE: Chronic  REFERRING DIAG: R29.6 (ICD-10-CM) - Recurrent falls  THERAPY DIAG:  Unsteadiness on feet  Frequent falls  Rationale for Evaluation and Treatment: Rehabilitation  SUBJECTIVE:                                                                                                                                                                                             SUBJECTIVE STATEMENT:  12/05/23  States feels fine.  Denies any pain or falls since last visit.  States he is doing his HEP.   Encouraged him to work on his flexibility for his back and hips as much as he does his balance  Pt comes in due  to falls. Has had history of falls for the last 3 years. Pt is on warfarin and goes to coumadin  clinic. Pt reports he normally falls forward. Has had PT in the past but has not been doing his exercises. Feels he has gotten weaker and not walking as far since stopping PT. Normally able to walk more around his neighborhood but currently only able to walk [place] 2-3 times. Recently got his rollator ~1 month ago Pt accompanied by: significant other, [name] (wife)  PERTINENT HISTORY: Wears thoracic/cervical brace at baseline. History of severe coronary artery disease with prior CABG. He has ischemic cardiomyopathy with a mildly reduced ejection fraction. He was diagnosed with atrial fibrillation in September 2022 and was started on amiodarone .   PAIN:  Are you having pain? No  PRECAUTIONS: Fall  RED FLAGS: None   WEIGHT BEARING RESTRICTIONS: No  FALLS: Has patient fallen in last 6 months? Yes. Number of falls at least 2 falls -- last fall on 08/23/23 slid off bed and hit head on his dresser  LIVING ENVIRONMENT: Lives with: lives with their spouse Lives in: House/apartment Stairs: 2 step ups to enter Has following equipment at home: Vannie - 4 wheeled  PLOF: Independent  PATIENT GOALS: Improve balance  OBJECTIVE:  Note: Objective measures were completed at Evaluation unless otherwise noted.  DIAGNOSTIC FINDINGS: CT cervical spine 08/23/23 IMPRESSION: 1. No acute traumatic injury identified in the cervical spine. 2. Chronic osteopenia, upper thoracic compression fractures with ankylosis and kyphosis.    COGNITION: Overall cognitive status: Within functional limits for tasks assessed   SENSATION: WFL  EDEMA:  None  MUSCLE TONE: Did not assess  DTRs:  Did not assess  POSTURE: Increased kyphosis. Wears neck and thoracic brace  LOWER EXTREMITY ROM:     Active  Right Eval Left Eval  Hip flexion    Hip extension    Hip abduction    Hip adduction  Hip  internal rotation    Hip external rotation    Knee flexion    Knee extension    Ankle dorsiflexion    Ankle plantarflexion    Ankle inversion    Ankle eversion     (Blank rows = not tested)  LOWER EXTREMITY MMT:    MMT Right Eval Left Eval RLE LLE  Hip flexion 5 4 5  4+  Hip extension 4- 3+    Hip abduction 3 3 3+ 4-  Hip adduction      Hip internal rotation      Hip external rotation      Knee flexion 5 5 5 5   Knee extension 5 4- 5 5  Ankle dorsiflexion   4- 4-  Ankle plantarflexion      Ankle inversion      Ankle eversion      (Blank rows = not tested)  BED MOBILITY:  SBA for safety  TRANSFERS: Sit to stand: Modified independence  Assistive device utilized: Environmental consultant - 4 wheeled     Stand to sit: Modified independence  Assistive device utilized: hands to bed      RAMP:  Not tested  CURB:  Not tested  STAIRS: Not tested GAIT: Findings: Distance walked: 180' and Comments: Decreased bilat step length, forward head  FUNCTIONAL TESTS:  5 times sit to stand: 13.22 sec using back of legs against bed Mini-BESTest: 14     PATIENT SURVEYS:  ABC scale: The Activities-Specific Balance Confidence (ABC) Scale 0% 10 20 30  40 50 60 70 80 90 100% No confidence<->completely confident  "How confident are you that you will not lose your balance or become unsteady when you . . .   Date tested 10/30/23  Walk around the house 90%  2. Walk up or down stairs 90%  3. Bend over and pick up a slipper from in front of a closet floor 90%  4. Reach for a small can off a shelf at eye level 80%  5. Stand on tip toes and reach for something above your head 90%  6. Stand on a chair and reach for something 0%  7. Sweep the floor 100%  8. Walk outside the house to a car parked in the driveway 80%  9. Get into or out of a car 100%  10. Walk across a parking lot to the mall 100% w/ walker  11. Walk up or down a ramp 80% w/ walker  12. Walk in a crowded mall where people rapidly walk  past you 80%  13. Are bumped into by people as you walk through the mall 10%  14. Step onto or off of an escalator while you are holding onto the railing 0%  15. Step onto or off an escalator while holding onto parcels such that you cannot hold onto the railing 0%  16. Walk outside on icy sidewalks 0%  Total: #/16 61.87%  TREATMENT DATE:  12/12/23 THERAPEUTIC EXERCISE: To improve strength.  Demonstration, verbal and tactile cues throughout for technique. UBE L2.5 x 5'  NEUROMUSCULAR RE-EDUCATION: To improve balance and coordination. Tandem stance x 1' BLE Tandem gait F/B X 3 LAPS at counter Diagonal stepping w/ PVC cross x 10 going L; x 10 going R Box stepping PVC cross x 5 laps CW; x 5 CCW Upside down BOSU balance x 1'; heel/toe rocking x 1';  s/s rocking x 1' BOSU balance right side up x 2' Step touch 8 x 10 BLE  THERAPEUTIC ACTIVITIES: To improve functional performance.  Demonstration, verbal and tactile cues throughout for technique. Counter back bends x 10 Supine thoracic extension x 20 Supine bridge x 20 POE x 3' POE with slight pressup w/ elbows x 5  12/05/23 THERAPEUTIC EXERCISE: To improve strength.  Demonstration, verbal and tactile cues throughout for technique. Nustep L4 x 6'  THERAPEUTIC ACTIVITIES: To improve functional performance.  Demonstration, verbal and tactile cues throughout for technique. Corner pec stretch at 90 x 30 sec x 3 Counter back ext x 10  Seated ham stretch x 1' x 3 BLE Prone lying x 3'  Prone press up on elbows x 10 Supine cervical and scapular retractionx 20 BUE  NEUROMUSCULAR RE-EDUCATION: To improve balance. Box stepping x 10 laps CW; x 10 CCW; diagonal steps at PVC cross x 5 L and x5 R Pool noodle step over and lunge x 2/8 BLE Toe/heel raises unsupported x 30   11/27/23 THERAPEUTIC EXERCISE: To improve  strength.  Demonstration, verbal and tactile cues throughout for technique. Nustep L4 x 6'  THERAPEUTIC ACTIVITIES: To improve functional performance.  Demonstration, verbal and tactile cues throughout for technique. Corner pec stretch at 90 x 30 sec x 3 Lateral sidebending stretch with arm up wall x 30 sec x 2 bilaterally Prone lying x 3' --tolerates surprisingly well Supine bridging x 20 Supine scapular retractionx 20 BUE Supine B shoulder flexion stretch w/ dowel x 1'  NEUROMUSCULAR RE-EDUCATION: To improve balance. Tandem stance x 1' BLE Sit to stand x 10 Square/box stepping CW x 5;  CCW x 5 Sidestepping x 6 laps at mat table, no UE support, GTB at knees Toe/heel raises no support x 20 BLE  11/25/23 Nustep L5x67min TUG - 14.13 sec Sit to stand x 10  Standing ant/post staggered WS x 10 Step over pool noodle 2x10 fwd Lateral step over pool noodle 2x10 Step ups 4' x 20 BLE  11/19/23 THERAPEUTIC ACTIVITIES: To improve functional performance.  Demonstration, verbal and tactile cues throughout for technique. 6 min walk test- 670' Nustep L5x91min UE/LE Seated LAQ 1lb x 10 BLE Seated march 1lb x 10 BLE Standing heel toe strikes x 10 Heel raises x 10 Mini squats x 10  11/12/23 THERAPEUTIC EXERCISE: To improve strength.  Demonstration, verbal and tactile cues throughout for technique. NuStep L5 x 5'  THERAPEUTIC ACTIVITIES: To improve functional performance.  Demonstration, verbal and tactile cues throughout for technique. Counter back extension x 2/10 Doorway pec stretch x 1' x 2 BUE Standing Toe/heel rocking x 20 BLE Heel walking and toe walking at counter x 3 laps STS arms crossed x 2/5 Counter lunges x 10 BLE  NEUROMUSCULAR RE-EDUCATION: To improve balance. Standing Step touch 8 step with manual wt shifting to the L Step stance on LLE x 1' with manual wt shifts to the L L pelvic sideglides at counter x 10 L sidebending at counter reaching RUE overhead x 10 QC step overs  2/10 F/B AND S/S  11/04/23  THERAPEUTIC EXERCISE: To improve strength, ROM, and flexibility.  Demonstration, verbal and tactile cues throughout for technique. UBE L1 4.5' Backward  THERAPEUTIC ACTIVITIES: To improve functional performance.  Demonstration, verbal and tactile cues throughout for technique.  Counter HEP review: Standing toe raises/heel raise rocking f/b Standing hip abd Sit to stand arms crossed 2/5 4 step ups x 10 BLE 4 step touch x 10 BLE Forward lunges x 10 BLE    NEUROMUSCULAR RE-EDUCATION: To improve balance, posture, and proprioception. SLS with min UE counter support x 10 sec x 5 BLE Step overs with QC x 10 f/b and s/s   Added: Standing back extension Standing step stance with wt shifting f/b x 10 BLE  10/30/23 See HEP below    PATIENT EDUCATION: Education details: Exam findings, POC Person educated: Patient Education method: Explanation and Demonstration Education comprehension: verbalized understanding, returned demonstration, and needs further education  HOME EXERCISE PROGRAM: Did not yet initiate new HEP. Prior code from last PT episode is below Access Code: [email or web address] URL: [email or web address] Date: 10/30/2023 Prepared by: Gellen April Earnie Starring  Exercises - Sit to Stand with Arms Crossed  - 1 x daily - 7 x weekly - 2 sets - 5 reps - Heel Raises with Counter Support  - 1 x daily - 7 x weekly - 1 sets - 10 reps - Alternating Step Taps with Counter Support  - 1 x daily - 7 x weekly - 1 sets - 10 reps - Forward Step Over with Counter Support  - 1 x daily - 7 x weekly - 1 sets - 10 reps - Push-Up on Counter  - 1 x daily - 7 x weekly - 1 sets - 10 reps - Standing Bent Over Single Arm Scapular Row with Table Support with PLB  - 1 x daily - 7 x weekly - 1 sets - 10 reps - stomping  - 1 x daily - 7 x weekly - 1 sets - 10 reps - Forward Step Up with Counter Support  - 1 x daily - 7 x weekly - 1 sets - 10 reps - Seated Shoulder Row  with Anchored Resistance  - 1 x daily - 7 x weekly - 2 sets - 10 reps - Standing Hip Abduction with Counter Support  - 1 x daily - 7 x weekly - 1 sets - 10 reps - Single Leg Stance with Support  - 1 x daily - 7 x weekly - 1 sets - 5 reps - Lunge with Counter Support  - 1 x daily - 7 x weekly - 1 sets - 5 reps  GOALS: Goals reviewed with patient? Yes  SHORT TERM GOALS: Target date: 11/27/2023   Pt will be ind with initial HEP Baseline: Goal status: MET  2.  Pt will have improved TUG to </=13 sec Baseline: 15.03 Goal status: PROGRESSING- 14.13 sec (11/25/23)  3.  PT will obtain 6 min walk test baseline Baseline:  Goal status: MET- 7/15 25   LONG TERM GOALS: Target date: 12/25/2023   Pt will be ind with management and progression of HEP Baseline:  Goal status: INITIAL  2.  Pt will have improved mini BESTest by 4 points to demo MCID Baseline: 14 Goal status: INITIAL  3.  Pt will demo at least 4/5 bilat LE strength for increased strength and stability Baseline:  Goal status: PROGRESSING; 11/27/23 hip abductors are improved from 3 to 3+ LLE and 3 to 4- on RLE  4.  Pt will have improved ABC score to >/=67% to be better than cut-off score for balance impairments Baseline: 61.87% Goal status: INITIAL  5.  Pt will have improved 6 min walk test by at least 160' to demo MCID for increased endurance Baseline:  Goal status: INITIAL    ASSESSMENT:  CLINICAL IMPRESSION: Patient does much better with box and diagonal stepping today and states he has been working hard on this at home.  It shows with treatment today.   HE is able to wt shift much better with his stepping.  We are able to advance to balancing on the BOSU ball for first time today.    Patient is a 88 y.o. M who was seen today for physical therapy evaluation and treatment for falls. PMH is significant for multiple falls, CAD with prior CABG, and increased kyphosis. Assessment demos general weakness, abnormal posture,  decreased endurance and high fall risk with history of multiple falls. Pt will benefit from PT to address these deficits to maximize his level of safety.    OBJECTIVE IMPAIRMENTS: Abnormal gait, decreased activity tolerance, decreased balance, decreased endurance, decreased mobility, difficulty walking, decreased strength, improper body mechanics, and postural dysfunction.   ACTIVITY LIMITATIONS: standing, squatting, stairs, transfers, and locomotion level  PARTICIPATION LIMITATIONS: cleaning, shopping, and community activity  PERSONAL FACTORS: Age, Fitness, Past/current experiences, and Time since onset of injury/illness/exacerbation are also affecting patient's functional outcome.   REHAB POTENTIAL: Good  CLINICAL DECISION MAKING: Evolving/moderate complexity  EVALUATION COMPLEXITY: Moderate  PLAN:  PT FREQUENCY: 2x/week  PT DURATION: 10 weeks  PLANNED INTERVENTIONS: [id number]- PT Re-evaluation, 97750- Physical Performance Testing, 97110-Therapeutic exercises, 97530- Therapeutic activity, 97112- Neuromuscular re-education, 97535- Self Care, [id number]- Manual therapy, [id number]- Gait training, [id number]- Aquatic Therapy, [phone]- Electrical stimulation (unattended), [id number]- Ultrasound, [id number]- Ionotophoresis 4mg /ml Dexamethasone , [id number] (1-2 muscles), [id number] (3+ muscles)- Dry Needling, Patient/Family education, Balance training, Stair training, Taping, Joint mobilization, Cryotherapy, and Moist heat  PLAN FOR NEXT SESSION: Continue with progressively difficult balancing activities  Ramin Zoll, PT 12/12/2023, 2:56 PM

## 2023-12-17 ENCOUNTER — Ambulatory Visit: Admitting: Rehabilitation

## 2023-12-17 DIAGNOSIS — R2681 Unsteadiness on feet: Secondary | ICD-10-CM | POA: Diagnosis not present

## 2023-12-17 DIAGNOSIS — R296 Repeated falls: Secondary | ICD-10-CM | POA: Diagnosis not present

## 2023-12-17 DIAGNOSIS — M6281 Muscle weakness (generalized): Secondary | ICD-10-CM

## 2023-12-17 DIAGNOSIS — R293 Abnormal posture: Secondary | ICD-10-CM | POA: Diagnosis not present

## 2023-12-17 DIAGNOSIS — M4123 Other idiopathic scoliosis, cervicothoracic region: Secondary | ICD-10-CM | POA: Diagnosis not present

## 2023-12-17 DIAGNOSIS — M542 Cervicalgia: Secondary | ICD-10-CM

## 2023-12-17 NOTE — Therapy (Signed)
 OUTPATIENT PHYSICAL THERAPY NEURO TREATMENT   Patient Name: Brent Griffith MRN: [id number] DOB:03-15-1936, 88 y.o., male Today's Date: 12/17/2023   PCP: [name], NP REFERRING PROVIDER: [name], NP  END OF SESSION:  PT End of Session - 12/17/23 1451     Visit Number 9    Number of Visits 16    Date for PT Re-Evaluation 12/25/23    Authorization Type [employer]    Authorization Time Period Auth requested 10/30/23    Progress Note Due on Visit 10    PT Start Time 1445    PT Stop Time 1532    PT Time Calculation (min) 47 min    Activity Tolerance Patient tolerated treatment well    Behavior During Therapy WFL for tasks assessed/performed            Past Medical History:  Diagnosis Date   Acute gastric ulcer without mention of hemorrhage, perforation, or obstruction    Allergic rhinitis, cause unspecified    Anemia, unspecified    Arthritis    Asthma    Blood transfusion without reported diagnosis    CAD (coronary artery disease)    Diaphragmatic hernia without mention of obstruction or gangrene    Elevated prostate specific antigen (PSA)    Enlarged prostate    Esophageal reflux    Extrinsic asthma, unspecified    Herpes zoster without mention of complication    Hyperlipidemia    Hypersomnia with sleep apnea, unspecified    Impotence of organic origin    Kyphosis    Lumbago    Obstructive sleep apnea (adult) (pediatric)    Pneumonia    hx of years ago    Senile osteoporosis    Trigger finger (acquired)    Tubular adenoma of colon 05/2013   Unspecified essential hypertension    Unspecified sleep apnea    CPAP- settings 4-12    Unspecified vitamin D deficiency    Past Surgical History:  Procedure Laterality Date   CARDIAC CATHETERIZATION  03/04/2009   Dr Kerrin   COLONOSCOPY     CORONARY ARTERY BYPASS GRAFT  2010   CYSTOSCOPY WITH RETROGRADE PYELOGRAM, URETEROSCOPY AND STENT PLACEMENT Right 10/04/2013   Procedure: CYSTOSCOPY  WITH RETROGRADE PYELOGRAM,  AND STENT PLACEMENT;  Surgeon: Donnice Brooks, MD;  Location: [facility];  Service: Urology;  Laterality: Right;   DENTAL SURGERY N/A    MOLE REMOVAL  1958   chin   ROTATOR CUFF REPAIR Left 04/1998   with bone spur removed, Dr Shari   TONSILLECTOMY  1945   TRANSURETHRAL RESECTION OF PROSTATE N/A 09/24/2013   Procedure: TRANSURETHRAL RESECTION OF THE PROSTATE (TURP) WITH GYRUS (STAGED RIGHT LATERAL AND MEDIAN LOBE);  Surgeon: Arlena LILLETTE Gal, MD;  Location: [facility];  Service: Urology;  Laterality: N/A;   UPPER GI ENDOSCOPY     Patient Active Problem List   Diagnosis Date Noted   Vitamin D deficiency 09/19/2023   Recurrent falls 09/19/2023   CKD (chronic kidney disease) stage 3, GFR 30-59 ml/min (HCC) 07/06/2022   Atrial fibrillation (HCC) 10/25/2021   HFrEF (heart failure with reduced ejection fraction) (HCC) 01/19/2021   CAD (coronary artery disease) 10/11/2017   IDA (iron deficiency anemia) 08/31/2016   Seasonal and perennial allergic rhinitis 08/28/2016   Palpitations 09/07/2015   Cough, persistent 07/20/2015   Senile osteoporosis 06/09/2014   Hydronephrosis of right kidney 10/03/2013   Benign prostatic hyperplasia 09/24/2013   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   Dyslipidemia 04/06/2009  OSA (obstructive sleep apnea) 02/04/2009   Essential hypertension 09/02/2008   GERD (gastroesophageal reflux disease) 09/02/2008    ONSET DATE: Chronic  REFERRING DIAG: R29.6 (ICD-10-CM) - Recurrent falls  THERAPY DIAG:  Unsteadiness on feet  Frequent falls  Cervicalgia  Muscle weakness (generalized)  Abnormal posture  Rationale for Evaluation and Treatment: Rehabilitation  SUBJECTIVE:                                                                                                                                                                                             SUBJECTIVE STATEMENT:  8/12: Patient reports feeling progressively better.  Denies any  falls since last visit.   States he is doing all of his HEP at home.  Thinks he will be ready for d/c in 1-2 weeks time and can continue at home  EVAL Pt comes in due to falls. Has had history of falls for the last 3 years. Pt is on warfarin and goes to coumadin  clinic. Pt reports he normally falls forward. Has had PT in the past but has not been doing his exercises. Feels he has gotten weaker and not walking as far since stopping PT. Normally able to walk more around his neighborhood but currently only able to walk [place] 2-3 times. Recently got his rollator ~1 month ago Pt accompanied by: significant other, [name] (wife)  PERTINENT HISTORY: Wears thoracic/cervical brace at baseline. History of severe coronary artery disease with prior CABG. He has ischemic cardiomyopathy with a mildly reduced ejection fraction. He was diagnosed with atrial fibrillation in September 2022 and was started on amiodarone .   PAIN:  Are you having pain? No  PRECAUTIONS: Fall  RED FLAGS: None   WEIGHT BEARING RESTRICTIONS: No  FALLS: Has patient fallen in last 6 months? Yes. Number of falls at least 2 falls -- last fall on 08/23/23 slid off bed and hit head on his dresser  LIVING ENVIRONMENT: Lives with: lives with their spouse Lives in: House/apartment Stairs: 2 step ups to enter Has following equipment at home: Vannie - 4 wheeled  PLOF: Independent  PATIENT GOALS: Improve balance  OBJECTIVE:  Note: Objective measures were completed at Evaluation unless otherwise noted.  DIAGNOSTIC FINDINGS: CT cervical spine 08/23/23 IMPRESSION: 1. No acute traumatic injury identified in the cervical spine. 2. Chronic osteopenia, upper thoracic compression fractures with ankylosis and kyphosis.    COGNITION: Overall cognitive status: Within functional limits for tasks assessed   SENSATION: WFL  EDEMA:  None  MUSCLE TONE: Did not assess  DTRs:  Did not assess  POSTURE: Increased kyphosis.  Wears neck and thoracic  brace  LOWER EXTREMITY ROM:     Active  Right Eval Left Eval  Hip flexion    Hip extension    Hip abduction    Hip adduction    Hip internal rotation    Hip external rotation    Knee flexion    Knee extension    Ankle dorsiflexion    Ankle plantarflexion    Ankle inversion    Ankle eversion     (Blank rows = not tested)  LOWER EXTREMITY MMT:    MMT Right Eval Left Eval RLE LLE  Hip flexion 5 4 5  4+  Hip extension 4- 3+    Hip abduction 3 3 3+ 4-  Hip adduction      Hip internal rotation      Hip external rotation      Knee flexion 5 5 5 5   Knee extension 5 4- 5 5  Ankle dorsiflexion   4- 4-  Ankle plantarflexion      Ankle inversion      Ankle eversion      (Blank rows = not tested)  BED MOBILITY:  SBA for safety  TRANSFERS: Sit to stand: Modified independence  Assistive device utilized: Environmental consultant - 4 wheeled     Stand to sit: Modified independence  Assistive device utilized: hands to bed      RAMP:  Not tested  CURB:  Not tested  STAIRS: Not tested GAIT: Findings: Distance walked: 180' and Comments: Decreased bilat step length, forward head  FUNCTIONAL TESTS:  5 times sit to stand: 13.22 sec using back of legs against bed Mini-BESTest: 14     PATIENT SURVEYS:  ABC scale: The Activities-Specific Balance Confidence (ABC) Scale 0% 10 20 30  40 50 60 70 80 90 100% No confidence<->completely confident  "How confident are you that you will not lose your balance or become unsteady when you . . .   Date tested 10/30/23  Walk around the house 90%  2. Walk up or down stairs 90%  3. Bend over and pick up a slipper from in front of a closet floor 90%  4. Reach for a small can off a shelf at eye level 80%  5. Stand on tip toes and reach for something above your head 90%  6. Stand on a chair and reach for something 0%  7. Sweep the floor 100%  8. Walk outside the house to a car parked in the driveway 80%  9. Get into or out of a  car 100%  10. Walk across a parking lot to the mall 100% w/ walker  11. Walk up or down a ramp 80% w/ walker  12. Walk in a crowded mall where people rapidly walk past you 80%  13. Are bumped into by people as you walk through the mall 10%  14. Step onto or off of an escalator while you are holding onto the railing 0%  15. Step onto or off an escalator while holding onto parcels such that you cannot hold onto the railing 0%  16. Walk outside on icy sidewalks 0%  Total: #/16 61.87%  TREATMENT DATE:  12/17/23 THERAPEUTIC EXERCISE: To improve strength.  Demonstration, verbal and tactile cues throughout for technique. Nustep L5 x 8'  NEUROMUSCULAR RE-EDUCATION: To improve balance and coordination. Tandem stance x 1' BLE Tandem gait F/B X 3 LAPS at counter Stepping over foam rolls obstacle course x 5 laps F/B at counter Diagonal stepping w/ PVC cross x 10 going L; x 10 going R Step stance 8 x 1' BLE Step touch 8 x 10 BLE SLS w/ contralateral toe touch x 1' BLE and manual wt shifting to L SLS w/ only intermittent RUE counter support x 1' BLE with CGA for manual L wt shifting  THERAPEUTIC ACTIVITIES: To improve functional performance.  Demonstration, verbal and tactile cues throughout for technique. Counter back bends x 10 L pelvic sideglides x 10 at counter Seated hamstring stretch x 1' x 2 BLE  12/12/23 THERAPEUTIC EXERCISE: To improve strength.  Demonstration, verbal and tactile cues throughout for technique. UBE L2.5 x 5'  NEUROMUSCULAR RE-EDUCATION: To improve balance and coordination. Tandem stance x 1' BLE Tandem gait F/B X 3 LAPS at counter Diagonal stepping w/ PVC cross x 10 going L; x 10 going R Box stepping PVC cross x 5 laps CW; x 5 CCW Upside down BOSU balance x 1'; heel/toe rocking x 1';  s/s rocking x 1' BOSU balance right side up x 2' Step  touch 8 x 10 BLE  THERAPEUTIC ACTIVITIES: To improve functional performance.  Demonstration, verbal and tactile cues throughout for technique. Counter back bends x 10 Supine thoracic extension x 20 Supine bridge x 20 POE x 3' POE with slight pressup w/ elbows x 5  12/05/23 THERAPEUTIC EXERCISE: To improve strength.  Demonstration, verbal and tactile cues throughout for technique. Nustep L4 x 6'  THERAPEUTIC ACTIVITIES: To improve functional performance.  Demonstration, verbal and tactile cues throughout for technique. Corner pec stretch at 90 x 30 sec x 3 Counter back ext x 10  Seated ham stretch x 1' x 3 BLE Prone lying x 3'  Prone press up on elbows x 10 Supine cervical and scapular retractionx 20 BUE  NEUROMUSCULAR RE-EDUCATION: To improve balance. Box stepping x 10 laps CW; x 10 CCW; diagonal steps at PVC cross x 5 L and x5 R Pool noodle step over and lunge x 2/8 BLE Toe/heel raises unsupported x 30  Education details: all exercises for 12/17/23 provided in print outs from medbridge and reviewed verbally and with treatment Person educated: Patient Education method: Medical illustrator Education comprehension: verbalized understanding, returned demonstration, and needs further education  HOME EXERCISE PROGRAM: Did not yet initiate new HEP. Prior code from last PT episode is below Access Code: [email or web address] URL: [email or web address] Date: 10/30/2023 Prepared by: Gellen April Earnie Starring  Exercises - Sit to Stand with Arms Crossed  - 1 x daily - 7 x weekly - 2 sets - 5 reps - Heel Raises with Counter Support  - 1 x daily - 7 x weekly - 1 sets - 10 reps - Alternating Step Taps with Counter Support  - 1 x daily - 7 x weekly - 1 sets - 10 reps - Forward Step Over with Counter Support  - 1 x daily - 7 x weekly - 1 sets - 10 reps - Push-Up on Counter  - 1 x daily - 7 x weekly - 1 sets - 10 reps - Standing Bent Over Single Arm Scapular Row with Table Support  with PLB  - 1 x daily - 7 x weekly -  1 sets - 10 reps - stomping  - 1 x daily - 7 x weekly - 1 sets - 10 reps - Forward Step Up with Counter Support  - 1 x daily - 7 x weekly - 1 sets - 10 reps - Seated Shoulder Row with Anchored Resistance  - 1 x daily - 7 x weekly - 2 sets - 10 reps - Standing Hip Abduction with Counter Support  - 1 x daily - 7 x weekly - 1 sets - 10 reps - Single Leg Stance with Support  - 1 x daily - 7 x weekly - 1 sets - 5 reps - Lunge with Counter Support  - 1 x daily - 7 x weekly - 1 sets - 5 reps  GOALS: Goals reviewed with patient? Yes  SHORT TERM GOALS: Target date: 11/27/2023   Pt will be ind with initial HEP Baseline: Goal status: MET  2.  Pt will have improved TUG to </=13 sec Baseline: 15.03 Goal status: PROGRESSING- 14.13 sec (11/25/23)  3.  PT will obtain 6 min walk test baseline Baseline:  Goal status: MET- 7/15 25   LONG TERM GOALS: Target date: 12/25/2023   Pt will be ind with management and progression of HEP Baseline: 12/17/23 can teach back HEP today Goal status: MET  2.  Pt will have improved mini BESTest by 4 points to demo MCID Baseline: 14 Goal status: INITIAL  3.  Pt will demo at least 4/5 bilat LE strength for increased strength and stability Baseline:  Goal status: PROGRESSING; 11/27/23 hip abductors are improved from 3 to 3+ LLE and 3 to 4- on RLE  4.  Pt will have improved ABC score to >/=67% to be better than cut-off score for balance impairments Baseline: 61.87% Goal status: INITIAL  5.  Pt will have improved 6 min walk test by at least 160' to demo MCID for increased endurance Baseline:  Goal status: INITIAL    ASSESSMENT:  CLINICAL IMPRESSION: 12/17/23:  Discussed d/c in next 2 weeks and patient is agreeable to this plan.  He should meet his goals by that time.  His biggest issue at this point is single leg stability on the LLE. He still has difficulty weight shifting to the L to move the RLE.  Focus today is on L  weight shifts with all exercises.   Patient is a 88 y.o. M who was seen today for physical therapy evaluation and treatment for falls. PMH is significant for multiple falls, CAD with prior CABG, and increased kyphosis. Assessment demos general weakness, abnormal posture, decreased endurance and high fall risk with history of multiple falls. Pt will benefit from PT to address these deficits to maximize his level of safety.    OBJECTIVE IMPAIRMENTS: Abnormal gait, decreased activity tolerance, decreased balance, decreased endurance, decreased mobility, difficulty walking, decreased strength, improper body mechanics, and postural dysfunction.   ACTIVITY LIMITATIONS: standing, squatting, stairs, transfers, and locomotion level  PARTICIPATION LIMITATIONS: cleaning, shopping, and community activity  PERSONAL FACTORS: Age, Fitness, Past/current experiences, and Time since onset of injury/illness/exacerbation are also affecting patient's functional outcome.   REHAB POTENTIAL: Good  CLINICAL DECISION MAKING: Evolving/moderate complexity  EVALUATION COMPLEXITY: Moderate  PLAN:  PT FREQUENCY: 2x/week  PT DURATION: 10 weeks  PLANNED INTERVENTIONS: [id number]- PT Re-evaluation, 97750- Physical Performance Testing, 97110-Therapeutic exercises, 97530- Therapeutic activity, [id number]- Neuromuscular re-education, 97535- Self Care, [id number]- Manual therapy, [id number]- Gait training, [phone]- Aquatic Therapy, [id number]- Electrical stimulation (unattended), [id number]- Ultrasound, [id number]- Ionotophoresis 4mg /ml Dexamethasone , 79439 (1-2 muscles),  79438 (3+ muscles)- Dry Needling, Patient/Family education, Balance training, Stair training, Taping, Joint mobilization, Cryotherapy, and Moist heat  PLAN FOR NEXT SESSION: TUG, ABC, check 6 min walk test  Muneeb Veras, PT 12/17/2023, 7:53 PM

## 2023-12-17 NOTE — Therapy (Signed)
 OUTPATIENT PHYSICAL THERAPY NEURO TREATMENT   Patient Name: Brent Griffith MRN: [id number] DOB:Oct 17, 1935, 88 y.o., male Today's Date: 12/17/2023   PCP: [name], NP REFERRING PROVIDER: [name], NP  END OF SESSION:  PT End of Session - 12/17/23 1451     Visit Number 9    Number of Visits 16    Date for PT Re-Evaluation 12/25/23    Authorization Type [employer]    Authorization Time Period Auth requested 10/30/23    Progress Note Due on Visit 10    PT Start Time 1445    PT Stop Time 1532    PT Time Calculation (min) 47 min    Activity Tolerance Patient tolerated treatment well    Behavior During Therapy WFL for tasks assessed/performed            Past Medical History:  Diagnosis Date   Acute gastric ulcer without mention of hemorrhage, perforation, or obstruction    Allergic rhinitis, cause unspecified    Anemia, unspecified    Arthritis    Asthma    Blood transfusion without reported diagnosis    CAD (coronary artery disease)    Diaphragmatic hernia without mention of obstruction or gangrene    Elevated prostate specific antigen (PSA)    Enlarged prostate    Esophageal reflux    Extrinsic asthma, unspecified    Herpes zoster without mention of complication    Hyperlipidemia    Hypersomnia with sleep apnea, unspecified    Impotence of organic origin    Kyphosis    Lumbago    Obstructive sleep apnea (adult) (pediatric)    Pneumonia    hx of years ago    Senile osteoporosis    Trigger finger (acquired)    Tubular adenoma of colon 05/2013   Unspecified essential hypertension    Unspecified sleep apnea    CPAP- settings 4-12    Unspecified vitamin D deficiency    Past Surgical History:  Procedure Laterality Date   CARDIAC CATHETERIZATION  03/04/2009   Dr Kerrin   COLONOSCOPY     CORONARY ARTERY BYPASS GRAFT  2010   CYSTOSCOPY WITH RETROGRADE PYELOGRAM, URETEROSCOPY AND STENT PLACEMENT Right 10/04/2013   Procedure: CYSTOSCOPY  WITH RETROGRADE PYELOGRAM,  AND STENT PLACEMENT;  Surgeon: Donnice Brooks, MD;  Location: [facility];  Service: Urology;  Laterality: Right;   DENTAL SURGERY N/A    MOLE REMOVAL  1958   chin   ROTATOR CUFF REPAIR Left 04/1998   with bone spur removed, Dr Shari   TONSILLECTOMY  1945   TRANSURETHRAL RESECTION OF PROSTATE N/A 09/24/2013   Procedure: TRANSURETHRAL RESECTION OF THE PROSTATE (TURP) WITH GYRUS (STAGED RIGHT LATERAL AND MEDIAN LOBE);  Surgeon: Arlena LILLETTE Gal, MD;  Location: [facility];  Service: Urology;  Laterality: N/A;   UPPER GI ENDOSCOPY     Patient Active Problem List   Diagnosis Date Noted   Vitamin D deficiency 09/19/2023   Recurrent falls 09/19/2023   CKD (chronic kidney disease) stage 3, GFR 30-59 ml/min (HCC) 07/06/2022   Atrial fibrillation (HCC) 10/25/2021   HFrEF (heart failure with reduced ejection fraction) (HCC) 01/19/2021   CAD (coronary artery disease) 10/11/2017   IDA (iron deficiency anemia) 08/31/2016   Seasonal and perennial allergic rhinitis 08/28/2016   Palpitations 09/07/2015   Cough, persistent 07/20/2015   Senile osteoporosis 06/09/2014   Hydronephrosis of right kidney 10/03/2013   Benign prostatic hyperplasia 09/24/2013   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   Dyslipidemia 04/06/2009  OSA (obstructive sleep apnea) 02/04/2009   Essential hypertension 09/02/2008   GERD (gastroesophageal reflux disease) 09/02/2008    ONSET DATE: Chronic  REFERRING DIAG: R29.6 (ICD-10-CM) - Recurrent falls  THERAPY DIAG:  Unsteadiness on feet  Frequent falls  Cervicalgia  Muscle weakness (generalized)  Abnormal posture  Rationale for Evaluation and Treatment: Rehabilitation  SUBJECTIVE:                                                                                                                                                                                             SUBJECTIVE STATEMENT:  8/12: Patient reports feeling progressively better.  Denies any  falls since last visit.   States he is doing all of his HEP at home.  Thinks he will be ready for d/c in 1-2 weeks time and can continue at home  EVAL Pt comes in due to falls. Has had history of falls for the last 3 years. Pt is on warfarin and goes to coumadin  clinic. Pt reports he normally falls forward. Has had PT in the past but has not been doing his exercises. Feels he has gotten weaker and not walking as far since stopping PT. Normally able to walk more around his neighborhood but currently only able to walk [place] 2-3 times. Recently got his rollator ~1 month ago Pt accompanied by: significant other, Brent Griffith (wife)  PERTINENT HISTORY: Wears thoracic/cervical brace at baseline. History of severe coronary artery disease with prior CABG. He has ischemic cardiomyopathy with a mildly reduced ejection fraction. He was diagnosed with atrial fibrillation in September 2022 and was started on amiodarone .   PAIN:  Are you having pain? No  PRECAUTIONS: Fall  RED FLAGS: None   WEIGHT BEARING RESTRICTIONS: No  FALLS: Has patient fallen in last 6 months? Yes. Number of falls at least 2 falls -- last fall on 08/23/23 slid off bed and hit head on his dresser  LIVING ENVIRONMENT: Lives with: lives with their spouse Lives in: House/apartment Stairs: 2 step ups to enter Has following equipment at home: Vannie - 4 wheeled  PLOF: Independent  PATIENT GOALS: Improve balance  OBJECTIVE:  Note: Objective measures were completed at Evaluation unless otherwise noted.  DIAGNOSTIC FINDINGS: CT cervical spine 08/23/23 IMPRESSION: 1. No acute traumatic injury identified in the cervical spine. 2. Chronic osteopenia, upper thoracic compression fractures with ankylosis and kyphosis.    COGNITION: Overall cognitive status: Within functional limits for tasks assessed   SENSATION: WFL  EDEMA:  None  MUSCLE TONE: Did not assess  DTRs:  Did not assess  POSTURE: Increased kyphosis.  Wears neck and thoracic  brace  LOWER EXTREMITY ROM:     Active  Right Eval Left Eval  Hip flexion    Hip extension    Hip abduction    Hip adduction    Hip internal rotation    Hip external rotation    Knee flexion    Knee extension    Ankle dorsiflexion    Ankle plantarflexion    Ankle inversion    Ankle eversion     (Blank rows = not tested)  LOWER EXTREMITY MMT:    MMT Right Eval Left Eval RLE LLE  Hip flexion 5 4 5  4+  Hip extension 4- 3+    Hip abduction 3 3 3+ 4-  Hip adduction      Hip internal rotation      Hip external rotation      Knee flexion 5 5 5 5   Knee extension 5 4- 5 5  Ankle dorsiflexion   4- 4-  Ankle plantarflexion      Ankle inversion      Ankle eversion      (Blank rows = not tested)  BED MOBILITY:  SBA for safety  TRANSFERS: Sit to stand: Modified independence  Assistive device utilized: Environmental consultant - 4 wheeled     Stand to sit: Modified independence  Assistive device utilized: hands to bed      RAMP:  Not tested  CURB:  Not tested  STAIRS: Not tested GAIT: Findings: Distance walked: 180' and Comments: Decreased bilat step length, forward head  FUNCTIONAL TESTS:  5 times sit to stand: 13.22 sec using back of legs against bed Mini-BESTest: 14     PATIENT SURVEYS:  ABC scale: The Activities-Specific Balance Confidence (ABC) Scale 0% 10 20 30  40 50 60 70 80 90 100% No confidence<->completely confident  "How confident are you that you will not lose your balance or become unsteady when you . . .   Date tested 10/30/23  Walk around the house 90%  2. Walk up or down stairs 90%  3. Bend over and pick up a slipper from in front of a closet floor 90%  4. Reach for a small can off a shelf at eye level 80%  5. Stand on tip toes and reach for something above your head 90%  6. Stand on a chair and reach for something 0%  7. Sweep the floor 100%  8. Walk outside the house to a car parked in the driveway 80%  9. Get into or out of a  car 100%  10. Walk across a parking lot to the mall 100% w/ walker  11. Walk up or down a ramp 80% w/ walker  12. Walk in a crowded mall where people rapidly walk past you 80%  13. Are bumped into by people as you walk through the mall 10%  14. Step onto or off of an escalator while you are holding onto the railing 0%  15. Step onto or off an escalator while holding onto parcels such that you cannot hold onto the railing 0%  16. Walk outside on icy sidewalks 0%  Total: #/16 61.87%  TREATMENT DATE:  12/17/23 THERAPEUTIC EXERCISE: To improve strength.  Demonstration, verbal and tactile cues throughout for technique. Nustep L5 x 8'  NEUROMUSCULAR RE-EDUCATION: To improve balance and coordination. Tandem stance x 1' BLE Tandem gait F/B X 3 LAPS at counter Stepping over foam rolls obstacle course x 5 laps F/B at counter Diagonal stepping w/ PVC cross x 10 going L; x 10 going R Step stance 8 x 1' BLE Step touch 8 x 10 BLE SLS w/ contralateral toe touch x 1' BLE and manual wt shifting to L SLS w/ only intermittent RUE counter support x 1' BLE with CGA for manual L wt shifting  THERAPEUTIC ACTIVITIES: To improve functional performance.  Demonstration, verbal and tactile cues throughout for technique. Counter back bends x 10 L pelvic sideglides x 10 at counter Seated hamstring stretch x 1' x 2 BLE  12/12/23 THERAPEUTIC EXERCISE: To improve strength.  Demonstration, verbal and tactile cues throughout for technique. UBE L2.5 x 5'  NEUROMUSCULAR RE-EDUCATION: To improve balance and coordination. Tandem stance x 1' BLE Tandem gait F/B X 3 LAPS at counter Diagonal stepping w/ PVC cross x 10 going L; x 10 going R Box stepping PVC cross x 5 laps CW; x 5 CCW Upside down BOSU balance x 1'; heel/toe rocking x 1';  s/s rocking x 1' BOSU balance right side up x 2' Step  touch 8 x 10 BLE  THERAPEUTIC ACTIVITIES: To improve functional performance.  Demonstration, verbal and tactile cues throughout for technique. Counter back bends x 10 Supine thoracic extension x 20 Supine bridge x 20 POE x 3' POE with slight pressup w/ elbows x 5  12/05/23 THERAPEUTIC EXERCISE: To improve strength.  Demonstration, verbal and tactile cues throughout for technique. Nustep L4 x 6'  THERAPEUTIC ACTIVITIES: To improve functional performance.  Demonstration, verbal and tactile cues throughout for technique. Corner pec stretch at 90 x 30 sec x 3 Counter back ext x 10  Seated ham stretch x 1' x 3 BLE Prone lying x 3'  Prone press up on elbows x 10 Supine cervical and scapular retractionx 20 BUE  NEUROMUSCULAR RE-EDUCATION: To improve balance. Box stepping x 10 laps CW; x 10 CCW; diagonal steps at PVC cross x 5 L and x5 R Pool noodle step over and lunge x 2/8 BLE Toe/heel raises unsupported x 30  Education details: all exercises for 12/17/23 provided in print outs from medbridge and reviewed verbally and with treatment Person educated: Patient Education method: Medical illustrator Education comprehension: verbalized understanding, returned demonstration, and needs further education  HOME EXERCISE PROGRAM: Did not yet initiate new HEP. Prior code from last PT episode is below Access Code: [email or web address] URL: [email or web address] Date: 10/30/2023 Prepared by: Gellen April Earnie Starring  Exercises - Sit to Stand with Arms Crossed  - 1 x daily - 7 x weekly - 2 sets - 5 reps - Heel Raises with Counter Support  - 1 x daily - 7 x weekly - 1 sets - 10 reps - Alternating Step Taps with Counter Support  - 1 x daily - 7 x weekly - 1 sets - 10 reps - Forward Step Over with Counter Support  - 1 x daily - 7 x weekly - 1 sets - 10 reps - Push-Up on Counter  - 1 x daily - 7 x weekly - 1 sets - 10 reps - Standing Bent Over Single Arm Scapular Row with Table Support  with PLB  - 1 x daily - 7 x weekly -  1 sets - 10 reps - stomping  - 1 x daily - 7 x weekly - 1 sets - 10 reps - Forward Step Up with Counter Support  - 1 x daily - 7 x weekly - 1 sets - 10 reps - Seated Shoulder Row with Anchored Resistance  - 1 x daily - 7 x weekly - 2 sets - 10 reps - Standing Hip Abduction with Counter Support  - 1 x daily - 7 x weekly - 1 sets - 10 reps - Single Leg Stance with Support  - 1 x daily - 7 x weekly - 1 sets - 5 reps - Lunge with Counter Support  - 1 x daily - 7 x weekly - 1 sets - 5 reps  GOALS: Goals reviewed with patient? Yes  SHORT TERM GOALS: Target date: 11/27/2023   Pt will be ind with initial HEP Baseline: Goal status: MET  2.  Pt will have improved TUG to </=13 sec Baseline: 15.03 Goal status: PROGRESSING- 14.13 sec (11/25/23)  3.  PT will obtain 6 min walk test baseline Baseline:  Goal status: MET- 7/15 25   LONG TERM GOALS: Target date: 12/25/2023   Pt will be ind with management and progression of HEP Baseline: 12/17/23 can teach back HEP today Goal status: MET  2.  Pt will have improved mini BESTest by 4 points to demo MCID Baseline: 14 Goal status: INITIAL  3.  Pt will demo at least 4/5 bilat LE strength for increased strength and stability Baseline:  Goal status: PROGRESSING; 11/27/23 hip abductors are improved from 3 to 3+ LLE and 3 to 4- on RLE  4.  Pt will have improved ABC score to >/=67% to be better than cut-off score for balance impairments Baseline: 61.87% Goal status: INITIAL  5.  Pt will have improved 6 min walk test by at least 160' to demo MCID for increased endurance Baseline:  Goal status: INITIAL    ASSESSMENT:  CLINICAL IMPRESSION: 12/17/23:  Discussed d/c in next 2 weeks and patient is agreeable to this plan.  He should meet his goals by that time.  His biggest issue at this point is single leg stability on the LLE. He still has difficulty weight shifting to the L to move the RLE.  Focus today is on L  weight shifts with all exercises.   Patient is a 88 y.o. M who was seen today for physical therapy evaluation and treatment for falls. PMH is significant for multiple falls, CAD with prior CABG, and increased kyphosis. Assessment demos general weakness, abnormal posture, decreased endurance and high fall risk with history of multiple falls. Pt will benefit from PT to address these deficits to maximize his level of safety.    OBJECTIVE IMPAIRMENTS: Abnormal gait, decreased activity tolerance, decreased balance, decreased endurance, decreased mobility, difficulty walking, decreased strength, improper body mechanics, and postural dysfunction.   ACTIVITY LIMITATIONS: standing, squatting, stairs, transfers, and locomotion level  PARTICIPATION LIMITATIONS: cleaning, shopping, and community activity  PERSONAL FACTORS: Age, Fitness, Past/current experiences, and Time since onset of injury/illness/exacerbation are also affecting patient's functional outcome.   REHAB POTENTIAL: Good  CLINICAL DECISION MAKING: Evolving/moderate complexity  EVALUATION COMPLEXITY: Moderate  PLAN:  PT FREQUENCY: 2x/week  PT DURATION: 10 weeks  PLANNED INTERVENTIONS: 97164- PT Re-evaluation, 97750- Physical Performance Testing, 97110-Therapeutic exercises, 97530- Therapeutic activity, [id number]- Neuromuscular re-education, 97535- Self Care, 02859- Manual therapy, [id number]- Gait training, [phone]- Aquatic Therapy, H9716- Electrical stimulation (unattended), [id number]- Ultrasound, 02966- Ionotophoresis 4mg /ml Dexamethasone , 79439 (1-2 muscles),  79438 (3+ muscles)- Dry Needling, Patient/Family education, Balance training, Stair training, Taping, Joint mobilization, Cryotherapy, and Moist heat  PLAN FOR NEXT SESSION: TUG, ABC, check 6 min walk test  Journee Bobrowski, PT 12/17/2023, 7:53 PM

## 2023-12-18 ENCOUNTER — Other Ambulatory Visit: Payer: Self-pay

## 2023-12-18 NOTE — Patient Instructions (Signed)
 Visit Information  Thank you for taking time to visit with me today. Please don't hesitate to contact me if I can be of assistance to you before our next scheduled appointment.  Your next care management appointment is by telephone on Monday, September 15 at 1:00 PM  Please call the care guide team at [phone] if you need to cancel, schedule, or reschedule an appointment.   Please call [phone]-TALK (toll free, 24 hour hotline) if you are experiencing a Mental Health or Behavioral Health Crisis or need someone to talk to.  Clayborne Ly RN BSN CCM [place]  [facility], [facility] Nurse Care Coordinator  Direct Dial: [phone] Website: [email or web address]

## 2023-12-18 NOTE — Patient Outreach (Signed)
 Complex Care Management   Visit Note  12/18/2023  Name:  Brent Griffith MRN: [id number] DOB: 1935/09/30  Situation: Referral received for Complex Care Management related to  Atrial Fibrillation, CAD, Hypertension I obtained verbal consent from Patient. Visit completed with patient on the phone.  Background:   Past Medical History:  Diagnosis Date   Acute gastric ulcer without mention of hemorrhage, perforation, or obstruction    Allergic rhinitis, cause unspecified    Anemia, unspecified    Arthritis    Asthma    Blood transfusion without reported diagnosis    CAD (coronary artery disease)    Diaphragmatic hernia without mention of obstruction or gangrene    Elevated prostate specific antigen (PSA)    Enlarged prostate    Esophageal reflux    Extrinsic asthma, unspecified    Herpes zoster without mention of complication    Hyperlipidemia    Hypersomnia with sleep apnea, unspecified    Impotence of organic origin    Kyphosis    Lumbago    Obstructive sleep apnea (adult) (pediatric)    Pneumonia    hx of years ago    Senile osteoporosis    Trigger finger (acquired)    Tubular adenoma of colon 05/2013   Unspecified essential hypertension    Unspecified sleep apnea    CPAP- settings 4-12    Unspecified vitamin D deficiency     Assessment: Patient Reported Symptoms:  Cognitive Cognitive Status: Alert and oriented to person, place, and time, Normal speech and language skills Cognitive/Intellectual Conditions Management [RPT]: None reported or documented in medical history or problem list   Health Maintenance Behaviors: Annual physical exam, Healthy diet Health Facilitated by: Healthy diet, Rest  Neurological Neurological Review of Symptoms: Not assessed    HEENT HEENT Symptoms Reported: Not assessed      Cardiovascular Cardiovascular Symptoms Reported: No symptoms reported Does patient have uncontrolled Hypertension?: No Cardiovascular Management Strategies:  Medication therapy, Medical device, Routine screening Cardiovascular Self-Management Outcome: 4 (good) Cardiovascular Comment: Reviewed recent medication change; Reviewed with patient self monitoring his BP three times weekly and record  Respiratory Respiratory Symptoms Reported: No symptoms reported    Endocrine  Endocrine Symptoms Reported: Not Assessed     Gastrointestinal Gastrointestinal Symptoms Reported: Not assessed      Genitourinary Genitourinary Symptoms Reported: Not assessed    Integumentary Integumentary Symptoms Reported: Skin changes Additional Integumentary Details: wart noted to right hand; provided contact for dermatology in order to schedule an initial visit Skin Management Strategies: Routine screening Skin Self-Management Outcome: 3 (uncertain)  Musculoskeletal Musculoskelatal Symptoms Reviewed: Difficulty walking, Limited mobility, Unsteady gait Additional Musculoskeletal Details: patient continues to work with outpatient PT Musculoskeletal Management Strategies: Exercise, Routine screening Musculoskeletal Self-Management Outcome: 4 (good) Falls in the past year?: Yes Number of falls in past year: 2 or more Was there an injury with Fall?: Yes Fall Risk Category Calculator: 3 Patient Fall Risk Level: High Fall Risk Patient at Risk for Falls Due to: History of fall(s), Impaired balance/gait, Impaired mobility Fall risk Follow up: Falls evaluation completed, Education provided, Falls prevention discussed  Psychosocial Psychosocial Symptoms Reported: No symptoms reported     Quality of Family Relationships: supportive, involved, helpful      11/15/2023    2:17 PM  Depression screen PHQ 2/9  Decreased Interest 0  Down, Depressed, Hopeless 0  PHQ - 2 Score 0    There were no vitals filed for this visit.  Medications Reviewed Today     Reviewed by  Luvern Mischke, Clayborne CROME, RN (Registered Nurse) on 12/18/23 at 1317  Med List Status: <None>   Medication Order  Taking? Sig Documenting Provider Last Dose Status Informant  albuterol  (VENTOLIN  HFA) 108 (90 Base) MCG/ACT inhaler [id number]  Inhale 1-2 puffs into the lungs every 6 (six) hours as needed for wheezing or shortness of breath. [place], Amy E, NP  Active   alum & mag hydroxide-simeth (MAALOX MAX) 400-400-40 MG/5ML suspension [id number]  Take 10 mLs by mouth every 6 (six) hours as needed (pain with swallowing and reflux). Ethyl Richerd BROCKS, MD  Active   amiodarone  (PACERONE ) 200 MG tablet [id number]  Take 1 tablet (200 mg total) by mouth daily. [name], MD  Active   Ascorbic Acid (VITAMIN C) 1000 MG tablet [id number]  Take 1,000 mg by mouth daily. [provider]  Active   Calcium  Citrate-Vitamin D (EQ CALCIUM  CITRATE+D3 PO) [id number]  Take 1,200 mg by mouth daily. [provider]  Active Self           Med Note MARTHA DEVIN BROCKS Pablo May 27, 2023  2:23 PM)    carvedilol  (COREG ) 3.125 MG tablet [id number]  TAKE 1 TABLET BY MOUTH TWICE  DAILY [name], MD  Active   Coenzyme Q10 (COQ10) 200 MG CAPS [id number]  Take 200 mg by mouth daily. [provider]  Active Self  doxazosin  (CARDURA ) 2 MG tablet [id number]  TAKE 1 TABLET BY MOUTH DAILY Crenshaw, Redell RAMAN, MD  Active   empagliflozin  (JARDIANCE ) 10 MG TABS tablet [id number]  Take 1 tablet (10 mg total) by mouth daily before breakfast. [name], MD  Active   famotidine  (PEPCID ) 20 MG tablet [id number]  TAKE 1 TABLET BY MOUTH TWICE  DAILY McMichael, Bayley M, PA-C  Active   finasteride  (PROSCAR ) 5 MG tablet [id number]  TAKE 1 TABLET BY MOUTH IN  THE MORNING Eubanks, Jessica K, NP  Active   fluticasone  (FLONASE ) 50 MCG/ACT nasal spray [id number]  Place 1 spray into both nostrils daily. Griselda Norris, MD  Active   furosemide  (LASIX ) 20 MG tablet [id number]  TAKE 1 TABLET BY MOUTH DAILY AS  NEEDED FOR LEG SWELLING Crenshaw, Redell RAMAN, MD  Active   Iron, Ferrous Sulfate, 325 (65 Fe) MG TABS [id number]  Take 325 mg  by mouth 2 (two) times daily. [name], NP  Active   lactose free nutrition (BOOST) LIQD [id number]  Take 237 mLs by mouth every Monday, Wednesday, and Friday. [provider]  Active   losartan  (COZAAR ) 25 MG tablet [id number]  Take 0.5 tablets (12.5 mg total) by mouth daily. Medina-Vargas, Monina C, NP  Active   mexiletine (MEXITIL) 150 MG capsule [id number]  Take 1 capsule (150 mg total) by mouth 2 (two) times daily. [name], MD  Active   pantoprazole  (PROTONIX ) 40 MG tablet [id number]  TAKE 1 TABLET BY MOUTH DAILY Eubanks, Jessica K, NP  Active   rosuvastatin  (CRESTOR ) 20 MG tablet [id number]  TAKE 1 TABLET BY MOUTH ONCE  DAILY FOR CHOLESTEROL Eubanks, Jessica K, NP  Active   warfarin (COUMADIN ) 2.5 MG tablet [id number]  TAKE 1/2 TO 1 TABLET BY MOUTH  DAILY AS DIRECTED BY THE  COUMADIN  CLINIC Crenshaw, Redell RAMAN, MD  Active   Med List Note Vernel, Reggy BIRCH, MD 07/15/14 2050): CPAP Advanced AutoPap 6-12            Recommendation:   Specialty provider follow-up with outpatient PT as directed  Continue Current  Plan of Care  Follow Up Plan:   Telephone follow up appointment date/time:  Monday, September 15 at 1:00 PM  Clayborne Ly RN BSN CCM [place]  [facility], [facility] Nurse Care Coordinator  Direct Dial: [phone] Website: [email or web address]

## 2023-12-19 ENCOUNTER — Ambulatory Visit: Admitting: Rehabilitation

## 2023-12-19 ENCOUNTER — Encounter: Payer: Self-pay | Admitting: Rehabilitation

## 2023-12-19 DIAGNOSIS — R2681 Unsteadiness on feet: Secondary | ICD-10-CM

## 2023-12-19 DIAGNOSIS — R296 Repeated falls: Secondary | ICD-10-CM

## 2023-12-19 DIAGNOSIS — M4123 Other idiopathic scoliosis, cervicothoracic region: Secondary | ICD-10-CM | POA: Diagnosis not present

## 2023-12-19 DIAGNOSIS — M6281 Muscle weakness (generalized): Secondary | ICD-10-CM | POA: Diagnosis not present

## 2023-12-19 DIAGNOSIS — R293 Abnormal posture: Secondary | ICD-10-CM | POA: Diagnosis not present

## 2023-12-19 DIAGNOSIS — M542 Cervicalgia: Secondary | ICD-10-CM | POA: Diagnosis not present

## 2023-12-19 NOTE — Therapy (Signed)
 OUTPATIENT PHYSICAL THERAPY NEURO TREATMENT   Patient Name: Brent Griffith MRN: [id number] DOB:1936/01/27, 88 y.o., male Today's Date: 12/19/2023   PCP: [name], NP REFERRING PROVIDER: [name], NP  END OF SESSION:  PT End of Session - 12/19/23 1453     Visit Number 10    Number of Visits 16    Date for PT Re-Evaluation 12/25/23    Authorization Type [employer]    Authorization Time Period Auth requested 10/30/23    Progress Note Due on Visit 10    PT Start Time 1445    PT Stop Time 1544    PT Time Calculation (min) 59 min    Activity Tolerance Patient tolerated treatment well    Behavior During Therapy WFL for tasks assessed/performed            Past Medical History:  Diagnosis Date   Acute gastric ulcer without mention of hemorrhage, perforation, or obstruction    Allergic rhinitis, cause unspecified    Anemia, unspecified    Arthritis    Asthma    Blood transfusion without reported diagnosis    CAD (coronary artery disease)    Diaphragmatic hernia without mention of obstruction or gangrene    Elevated prostate specific antigen (PSA)    Enlarged prostate    Esophageal reflux    Extrinsic asthma, unspecified    Herpes zoster without mention of complication    Hyperlipidemia    Hypersomnia with sleep apnea, unspecified    Impotence of organic origin    Kyphosis    Lumbago    Obstructive sleep apnea (adult) (pediatric)    Pneumonia    hx of years ago    Senile osteoporosis    Trigger finger (acquired)    Tubular adenoma of colon 05/2013   Unspecified essential hypertension    Unspecified sleep apnea    CPAP- settings 4-12    Unspecified vitamin D deficiency    Past Surgical History:  Procedure Laterality Date   CARDIAC CATHETERIZATION  03/04/2009   Dr Kerrin   COLONOSCOPY     CORONARY ARTERY BYPASS GRAFT  2010   CYSTOSCOPY WITH RETROGRADE PYELOGRAM, URETEROSCOPY AND STENT PLACEMENT Right 10/04/2013   Procedure: CYSTOSCOPY  WITH RETROGRADE PYELOGRAM,  AND STENT PLACEMENT;  Surgeon: Donnice Brooks, MD;  Location: [facility];  Service: Urology;  Laterality: Right;   DENTAL SURGERY N/A    MOLE REMOVAL  1958   chin   ROTATOR CUFF REPAIR Left 04/1998   with bone spur removed, Dr Shari   TONSILLECTOMY  1945   TRANSURETHRAL RESECTION OF PROSTATE N/A 09/24/2013   Procedure: TRANSURETHRAL RESECTION OF THE PROSTATE (TURP) WITH GYRUS (STAGED RIGHT LATERAL AND MEDIAN LOBE);  Surgeon: Arlena LILLETTE Gal, MD;  Location: [facility];  Service: Urology;  Laterality: N/A;   UPPER GI ENDOSCOPY     Patient Active Problem List   Diagnosis Date Noted   Vitamin D deficiency 09/19/2023   Recurrent falls 09/19/2023   CKD (chronic kidney disease) stage 3, GFR 30-59 ml/min (HCC) 07/06/2022   Atrial fibrillation (HCC) 10/25/2021   HFrEF (heart failure with reduced ejection fraction) (HCC) 01/19/2021   CAD (coronary artery disease) 10/11/2017   IDA (iron deficiency anemia) 08/31/2016   Seasonal and perennial allergic rhinitis 08/28/2016   Palpitations 09/07/2015   Cough, persistent 07/20/2015   Senile osteoporosis 06/09/2014   Hydronephrosis of right kidney 10/03/2013   Benign prostatic hyperplasia 09/24/2013   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   Dyslipidemia 04/06/2009  OSA (obstructive sleep apnea) 02/04/2009   Essential hypertension 09/02/2008   GERD (gastroesophageal reflux disease) 09/02/2008    ONSET DATE: Chronic  REFERRING DIAG: R29.6 (ICD-10-CM) - Recurrent falls  THERAPY DIAG:  Unsteadiness on feet  Frequent falls  Cervicalgia  Muscle weakness (generalized)  Abnormal posture  Other idiopathic scoliosis, cervicothoracic region  Rationale for Evaluation and Treatment: Rehabilitation  SUBJECTIVE:                                                                                                                                                                                             SUBJECTIVE STATEMENT: 12/19/23:   Patient states feels ok. He brings in a [facility] and wants to know if he can use it for ambulation.    EVAL Pt comes in due to falls. Has had history of falls for the last 3 years. Pt is on warfarin and goes to coumadin  clinic. Pt reports he normally falls forward. Has had PT in the past but has not been doing his exercises. Feels he has gotten weaker and not walking as far since stopping PT. Normally able to walk more around his neighborhood but currently only able to walk [place] 2-3 times. Recently got his rollator ~1 month ago Pt accompanied by: significant other, [name] (wife)  PERTINENT HISTORY: Wears thoracic/cervical brace at baseline. History of severe coronary artery disease with prior CABG. He has ischemic cardiomyopathy with a mildly reduced ejection fraction. He was diagnosed with atrial fibrillation in September 2022 and was started on amiodarone .   PAIN:  Are you having pain? No  PRECAUTIONS: Fall  RED FLAGS: None   WEIGHT BEARING RESTRICTIONS: No  FALLS: Has patient fallen in last 6 months? Yes. Number of falls at least 2 falls -- last fall on 08/23/23 slid off bed and hit head on his dresser  LIVING ENVIRONMENT: Lives with: lives with their spouse Lives in: House/apartment Stairs: 2 step ups to enter Has following equipment at home: Vannie - 4 wheeled  PLOF: Independent  PATIENT GOALS: Improve balance  OBJECTIVE:  Note: Objective measures were completed at Evaluation unless otherwise noted.  DIAGNOSTIC FINDINGS: CT cervical spine 08/23/23 IMPRESSION: 1. No acute traumatic injury identified in the cervical spine. 2. Chronic osteopenia, upper thoracic compression fractures with ankylosis and kyphosis.    COGNITION: Overall cognitive status: Within functional limits for tasks assessed   SENSATION: WFL  EDEMA:  None  MUSCLE TONE: Did not assess  DTRs:  Did not assess  POSTURE: Increased kyphosis. Wears neck and thoracic brace  LOWER EXTREMITY  ROM:     Active  Right Eval  Left Eval  Hip flexion    Hip extension    Hip abduction    Hip adduction    Hip internal rotation    Hip external rotation    Knee flexion    Knee extension    Ankle dorsiflexion    Ankle plantarflexion    Ankle inversion    Ankle eversion     (Blank rows = not tested)  LOWER EXTREMITY MMT:    MMT Right Eval Left Eval RLE LLE RLE 12/19/23 Lle 12/19/23  Hip flexion 5 4 5  4+    Hip extension 4- 3+      Hip abduction 3 3 3+ 4- 4- 4  Hip adduction        Hip internal rotation        Hip external rotation        Knee flexion 5 5 5 5     Knee extension 5 4- 5 5    Ankle dorsiflexion   4- 4-    Ankle plantarflexion        Ankle inversion        Ankle eversion        (Blank rows = not tested)  BED MOBILITY:  SBA for safety  TRANSFERS: Sit to stand: Modified independence  Assistive device utilized: Environmental consultant - 4 wheeled     Stand to sit: Modified independence  Assistive device utilized: hands to bed      RAMP:  Not tested  CURB:  Not tested  STAIRS: Not tested GAIT: Findings: Distance walked: 180' and Comments: Decreased bilat step length, forward head  FUNCTIONAL TESTS:  5 times sit to stand: 13.22 sec using back of legs against bed Mini-BESTest: 14  [facility] PT Assessment - 12/19/23 0001       Mini-BESTest   Sit To Stand Severe: Unable to stand up from chair without assistance, OR needs several attempts with use of hands.    Rise to Toes < 3 s.    Stand on one leg (left) Severe: Unable    Stand on one leg (right) Severe: Unable    Stand on one leg - lowest score 0    Compensatory Stepping Correction - Forward Normal: Recovers independently with a single, large step (second realignement is allowed).    Compensatory Stepping Correction - Backward Normal: Recovers independently with a single, large step    Compensatory Stepping Correction - Left Lateral Normal: Recovers independently with 1 step (crossover or lateral OK)     Compensatory Stepping Correction - Right Lateral Normal: Recovers independently with 1 step (crossover or lateral OK)    Stepping Corredtion Lateral - lowest score 2    Stance - Feet together, eyes open, firm surface  Normal: 30s    Stance - Feet together, eyes closed, foam surface  Severe: Unable    Incline - Eyes Closed Severe: Unable    Change in Gait Speed Normal: Significantly changes walkling speed without imbalance    Walk with head turns - Horizontal Normal: performs head turns with no change in gait speed and good balance    Walk with pivot turns Normal: Turns with feet close FAST (< 3 steps) with good balance.    Step over obstacles Moderate: Steps over box but touches box OR displays cautious behavior by slowing gait.    Timed UP & GO with Dual Task Moderate: Dual Task affects either counting OR walking (>10%) when compared to the TUG without Dual Task.    Mini-BEST total score 16  PATIENT SURVEYS:  ABC scale: The Activities-Specific Balance Confidence (ABC) Scale 0% 10 20 30  40 50 60 70 80 90 100% No confidence<->completely confident  "How confident are you that you will not lose your balance or become unsteady when you . . .   Date tested 10/30/23  Walk around the house 90%  2. Walk up or down stairs 90%  3. Bend over and pick up a slipper from in front of a closet floor 90%  4. Reach for a small can off a shelf at eye level 80%  5. Stand on tip toes and reach for something above your head 90%  6. Stand on a chair and reach for something 0%  7. Sweep the floor 100%  8. Walk outside the house to a car parked in the driveway 80%  9. Get into or out of a car 100%  10. Walk across a parking lot to the mall 100% w/ walker  11. Walk up or down a ramp 80% w/ walker  12. Walk in a crowded mall where people rapidly walk past you 80%  13. Are bumped into by people as you walk through the mall 10%  14. Step onto or off of an escalator while you are holding onto the  railing 0%  15. Step onto or off an escalator while holding onto parcels such that you cannot hold onto the railing 0%  16. Walk outside on icy sidewalks 0%  Total: #/16 61.87%                                                                                                                                 TREATMENT DATE:  12/19/23 THERAPEUTIC EXERCISE: To improve strength.  Demonstration, verbal and tactile cues throughout for technique. Nustep L4 x 5'  Physical performance testing:  completed 6 min walk test, mini Best test (see flowsheet section and goals for values)  NEUROMUSCULAR RE-EDUCATION: To improve balance. Incline ramp stance on stabilized rocker board x 1' x 2 with mod assist Sit to stand x 2/5 Gait with SBQC and facilitation for proper sequence, getting all 4 points of cane on floor, slowing pace Gait with SPC with height adjustment, facilitation of sequence and not robot walking with step to gait pattern and CGA for balance  THERAPEUTIC ACTIVITIES: To improve functional performance.  Demonstration, verbal and tactile cues throughout for technique. Rolling s/s on mat table x 2 with vc for engaging transverse abdominals to initial roll Sit to and from supine on mat table with vc for bending knees to roll to side; using underside elbow to dig in and push up  12/17/23 THERAPEUTIC EXERCISE: To improve strength.  Demonstration, verbal and tactile cues throughout for technique. Nustep L5 x 8'  NEUROMUSCULAR RE-EDUCATION: To improve balance and coordination. Tandem stance x 1' BLE Tandem gait F/B X 3 LAPS at counter Stepping over foam rolls obstacle course x 5 laps F/B at counter Diagonal  stepping w/ PVC cross x 10 going L; x 10 going R Step stance 8 x 1' BLE Step touch 8 x 10 BLE SLS w/ contralateral toe touch x 1' BLE and manual wt shifting to L SLS w/ only intermittent RUE counter support x 1' BLE with CGA for manual L wt shifting  THERAPEUTIC ACTIVITIES: To improve  functional performance.  Demonstration, verbal and tactile cues throughout for technique. Counter back bends x 10 L pelvic sideglides x 10 at counter Seated hamstring stretch x 1' x 2 BLE  12/12/23 THERAPEUTIC EXERCISE: To improve strength.  Demonstration, verbal and tactile cues throughout for technique. UBE L2.5 x 5'  NEUROMUSCULAR RE-EDUCATION: To improve balance and coordination. Tandem stance x 1' BLE Tandem gait F/B X 3 LAPS at counter Diagonal stepping w/ PVC cross x 10 going L; x 10 going R Box stepping PVC cross x 5 laps CW; x 5 CCW Upside down BOSU balance x 1'; heel/toe rocking x 1';  s/s rocking x 1' BOSU balance right side up x 2' Step touch 8 x 10 BLE  THERAPEUTIC ACTIVITIES: To improve functional performance.  Demonstration, verbal and tactile cues throughout for technique. Counter back bends x 10 Supine thoracic extension x 20 Supine bridge x 20 POE x 3' POE with slight pressup w/ elbows x 5  12/05/23 THERAPEUTIC EXERCISE: To improve strength.  Demonstration, verbal and tactile cues throughout for technique. Nustep L4 x 6'  THERAPEUTIC ACTIVITIES: To improve functional performance.  Demonstration, verbal and tactile cues throughout for technique. Corner pec stretch at 90 x 30 sec x 3 Counter back ext x 10  Seated ham stretch x 1' x 3 BLE Prone lying x 3'  Prone press up on elbows x 10 Supine cervical and scapular retractionx 20 BUE  NEUROMUSCULAR RE-EDUCATION: To improve balance. Box stepping x 10 laps CW; x 10 CCW; diagonal steps at PVC cross x 5 L and x5 R Pool noodle step over and lunge x 2/8 BLE Toe/heel raises unsupported x 30  Education details: all exercises for 12/17/23 provided in print outs from medbridge and reviewed verbally and with treatment Person educated: Patient Education method: Medical illustrator Education comprehension: verbalized understanding, returned demonstration, and needs further education  HOME EXERCISE PROGRAM: Did  not yet initiate new HEP. Prior code from last PT episode is below Access Code: [email or web address] URL: [email or web address] Date: 10/30/2023 Prepared by: Gellen April Earnie Starring  Exercises - Sit to Stand with Arms Crossed  - 1 x daily - 7 x weekly - 2 sets - 5 reps - Heel Raises with Counter Support  - 1 x daily - 7 x weekly - 1 sets - 10 reps - Alternating Step Taps with Counter Support  - 1 x daily - 7 x weekly - 1 sets - 10 reps - Forward Step Over with Counter Support  - 1 x daily - 7 x weekly - 1 sets - 10 reps - Push-Up on Counter  - 1 x daily - 7 x weekly - 1 sets - 10 reps - Standing Bent Over Single Arm Scapular Row with Table Support with PLB  - 1 x daily - 7 x weekly - 1 sets - 10 reps - stomping  - 1 x daily - 7 x weekly - 1 sets - 10 reps - Forward Step Up with Counter Support  - 1 x daily - 7 x weekly - 1 sets - 10 reps - Seated Shoulder Row with Anchored Resistance  - 1 x  daily - 7 x weekly - 2 sets - 10 reps - Standing Hip Abduction with Counter Support  - 1 x daily - 7 x weekly - 1 sets - 10 reps - Single Leg Stance with Support  - 1 x daily - 7 x weekly - 1 sets - 5 reps - Lunge with Counter Support  - 1 x daily - 7 x weekly - 1 sets - 5 reps  GOALS: Goals reviewed with patient? Yes  SHORT TERM GOALS: Target date: 11/27/2023   Pt will be ind with initial HEP Baseline: Goal status: MET  2.  Pt will have improved TUG to </=13 sec Baseline: 15.03 Goal status: PROGRESSING- 14.13 sec (11/25/23)  3.  PT will obtain 6 min walk test baseline Baseline:  Goal status: MET- 7/15 25   LONG TERM GOALS: Target date: 12/25/2023   Pt will be ind with management and progression of HEP Baseline: 12/17/23 can teach back HEP today Goal status: MET  2.  Pt will have improved mini BESTest by 4 points to demo MCID Baseline: 14 12/19/23:  16 Goal status: IN PROGRESS  3.  Pt will demo at least 4/5 bilat LE strength for increased strength and stability Baseline:  Goal  status: PROGRESSING; 11/27/23 hip abductors are improved from 3 to 3+ LLE and 3 to 4- on RLE 12/19/23: 4- LLE; 4 RLE  4.  Pt will have improved ABC score to >/=67% to be better than cut-off score for balance impairments Baseline: 61.87% 12/19/23-71.3% Goal status: IN PROGRESS  5.  Pt will have improved 6 min walk test by at least 160' to demo MCID for increased endurance Baseline:  12/19/23 = 1500 FEET Goal status: MET; adequate for community ambulation in and out of stores with rollator with seat    ASSESSMENT:  CLINICAL IMPRESSION: 8/14:  Continuing to progress (see goals and physical performance testing results today). Anticipate d/c in 2-3 weeks with goals met.  He is agreeable to this plan.  He is not safe to use a quad cane or a SPC out of his home and he is strongly advised to use his rollator at all times when out of home.  Hopefully he will be compliant with this advice.  Otherwise he is a very high fall risk with cane or any single UE supportive device.   Spouse is present today and is advised to be sure to encourage rollator use when he goes out to mailbox (which patient admits he is not doing).   Patient is a 88 y.o. M who was seen today for physical therapy evaluation and treatment for falls. PMH is significant for multiple falls, CAD with prior CABG, and increased kyphosis. Assessment demos general weakness, abnormal posture, decreased endurance and high fall risk with history of multiple falls. Pt will benefit from PT to address these deficits to maximize his level of safety.    OBJECTIVE IMPAIRMENTS: Abnormal gait, decreased activity tolerance, decreased balance, decreased endurance, decreased mobility, difficulty walking, decreased strength, improper body mechanics, and postural dysfunction.   ACTIVITY LIMITATIONS: standing, squatting, stairs, transfers, and locomotion level  PARTICIPATION LIMITATIONS: cleaning, shopping, and community activity  PERSONAL FACTORS: Age,  Fitness, Past/current experiences, and Time since onset of injury/illness/exacerbation are also affecting patient's functional outcome.   REHAB POTENTIAL: Good  CLINICAL DECISION MAKING: Evolving/moderate complexity  EVALUATION COMPLEXITY: Moderate  PLAN:  PT FREQUENCY: 2x/week  PT DURATION: 10 weeks  PLANNED INTERVENTIONS: 97164- PT Re-evaluation, 97750- Physical Performance Testing, 97110-Therapeutic exercises, 97530- Therapeutic activity,  02887- Neuromuscular re-education, [phone]- Self Care, 02859- Manual therapy, [id number]- Gait training, [phone]- Aquatic Therapy, [phone]- Electrical stimulation (unattended), [phone]- Ultrasound, [id number]- Ionotophoresis 4mg /ml Dexamethasone , 20560 (1-2 muscles), 20561 (3+ muscles)- Dry Needling, Patient/Family education, Balance training, Stair training, Taping, Joint mobilization, Cryotherapy, and Moist heat  PLAN FOR NEXT SESSION: Work on incline and decline ramp balance using rocker board with weights underneath or other stabilized ramp  Torion Hulgan, PT 12/19/2023, 9:05 PM

## 2023-12-21 ENCOUNTER — Emergency Department (HOSPITAL_BASED_OUTPATIENT_CLINIC_OR_DEPARTMENT_OTHER)
Admission: EM | Admit: 2023-12-21 | Discharge: 2023-12-21 | Disposition: A | Discharge status: Home / Self Care | Attending: Emergency Medicine | Admitting: Emergency Medicine

## 2023-12-21 ENCOUNTER — Other Ambulatory Visit: Payer: Self-pay

## 2023-12-21 ENCOUNTER — Encounter (HOSPITAL_BASED_OUTPATIENT_CLINIC_OR_DEPARTMENT_OTHER): Payer: Self-pay

## 2023-12-21 DIAGNOSIS — Z7984 Long term (current) use of oral hypoglycemic drugs: Secondary | ICD-10-CM | POA: Insufficient documentation

## 2023-12-21 DIAGNOSIS — S00461A Insect bite (nonvenomous) of right ear, initial encounter: Secondary | ICD-10-CM | POA: Diagnosis not present

## 2023-12-21 DIAGNOSIS — W57XXXA Bitten or stung by nonvenomous insect and other nonvenomous arthropods, initial encounter: Secondary | ICD-10-CM | POA: Diagnosis not present

## 2023-12-21 DIAGNOSIS — Z79899 Other long term (current) drug therapy: Secondary | ICD-10-CM | POA: Insufficient documentation

## 2023-12-21 NOTE — ED Triage Notes (Signed)
 Pt states he was outside earlier and he went back in the house, and while he was in the house (1630 ) he mentions he felt a bite/sting behind his ear. Upon inspection there is dime sized red spot with minimal swelling and no exact puncture spots behind the ear within the spot. He mentions taking tylenol  around 5pm.

## 2023-12-21 NOTE — Discharge Instructions (Signed)
 You were seen in the ER today for evaluation of your insect bite. There is not a stinger left in the area. Make sure that it stays clean. You can take Tylenol  as needed for pain and apply heat or ice to the area as needed.  Maximum 15 minutes every few hours.  Please make sure that you are keeping an eye on the area.  If you notice that swelling increases, pain increases, you start getting a fever or red streaking from the area, please return to your nearest emergency department.  I have included additional information for you to review in the paperwork.  If you have any concerns, new or worsening symptoms, please return to the nearest emergency department for reevaluation.  Contact a doctor if: You have redness, swelling, or pain in the bite area. You have fluid or blood coming from the bite area. The bite area feels warm to the touch. You have pus or a bad smell coming from the bite area. You have a fever. Get help right away if: You have joint pain. You have a rash. You feel weak or more tired than you normally do. You have neck pain or a headache. You have signs of an anaphylactic reaction. Signs may include: Swelling of your eyes, lips, face, mouth, tongue, or throat. Feeling warm in the face. Itchy, red, swollen areas of skin. Trouble with breathing, talking, or swallowing. Wheezing. Feeling dizzy or light-headed. Fainting. Pain or cramps in your belly. Vomiting or watery poop. These symptoms may be an emergency. Get help right away. Call 911. Do not wait to see if symptoms will go away. Do not drive yourself to the hospital.

## 2023-12-21 NOTE — ED Provider Notes (Signed)
 [facility] Provider Note   CSN: [id number] Arrival date & time: 12/21/23  2158     Patient presents with: Insect Bite   Brent Griffith is a 88 y.o. male presents to the ER today for evaluation after insect bite/sting. The patient reports he knows it was an insect because he heard the buzzing in his ear and felt it near his ear and felt a sting.  He was concerned there was still a stinger remained in there.  This happened around 1630 today.  He did 2 Tylenol  with some pain relief reports it still hurts some.  He has not had any chest pain, shortness of breath, trouble swallowing, trouble breathing, nausea, vomiting, worsening of pain or swelling.  Has not had any fevers.  HPI     Prior to Admission medications   Medication Sig Start Date End Date Taking? Authorizing Provider  albuterol  (VENTOLIN  HFA) 108 (90 Base) MCG/ACT inhaler Inhale 1-2 puffs into the lungs every 6 (six) hours as needed for wheezing or shortness of breath. 07/03/22   Fargo, Amy E, NP  alum & mag hydroxide-simeth (MAALOX MAX) 400-400-40 MG/5ML suspension Take 10 mLs by mouth every 6 (six) hours as needed (pain with swallowing and reflux). 10/21/22   Ethyl Richerd BROCKS, MD  amiodarone  (PACERONE ) 200 MG tablet Take 1 tablet (200 mg total) by mouth daily. 08/07/23   [name], MD  Ascorbic Acid (VITAMIN C) 1000 MG tablet Take 1,000 mg by mouth daily.    [provider]  Calcium  Citrate-Vitamin D (EQ CALCIUM  CITRATE+D3 PO) Take 1,200 mg by mouth daily.    [provider]  carvedilol  (COREG ) 3.125 MG tablet TAKE 1 TABLET BY MOUTH TWICE  DAILY 10/01/23   [name], MD  Coenzyme Q10 (COQ10) 200 MG CAPS Take 200 mg by mouth daily.    [provider]  doxazosin  (CARDURA ) 2 MG tablet TAKE 1 TABLET BY MOUTH DAILY 11/18/23   [name], MD  empagliflozin  (JARDIANCE ) 10 MG TABS tablet Take 1 tablet (10 mg total) by mouth daily before breakfast.  06/05/23   [name], MD  famotidine  (PEPCID ) 20 MG tablet TAKE 1 TABLET BY MOUTH TWICE  DAILY 10/07/23   McMichael, Bayley M, PA-C  finasteride  (PROSCAR ) 5 MG tablet TAKE 1 TABLET BY MOUTH IN  THE MORNING 10/06/21   Eubanks, Jessica K, NP  fluticasone  (FLONASE ) 50 MCG/ACT nasal spray Place 1 spray into both nostrils daily. 08/23/23   Griselda Norris, MD  furosemide  (LASIX ) 20 MG tablet TAKE 1 TABLET BY MOUTH DAILY AS  NEEDED FOR LEG SWELLING 10/01/23   [name], MD  Iron, Ferrous Sulfate, 325 (65 Fe) MG TABS Take 325 mg by mouth 2 (two) times daily. 09/13/21   [name], NP  lactose free nutrition (BOOST) LIQD Take 237 mLs by mouth every Monday, Wednesday, and Friday.    [provider]  losartan  (COZAAR ) 25 MG tablet Take 0.5 tablets (12.5 mg total) by mouth daily. 12/11/23   Medina-Vargas, Monina C, NP  mexiletine (MEXITIL) 150 MG capsule Take 1 capsule (150 mg total) by mouth 2 (two) times daily. 04/16/23   [name], MD  pantoprazole  (PROTONIX ) 40 MG tablet TAKE 1 TABLET BY MOUTH DAILY 03/01/23   Eubanks, Jessica K, NP  rosuvastatin  (CRESTOR ) 20 MG tablet TAKE 1 TABLET BY MOUTH ONCE  DAILY FOR CHOLESTEROL 07/29/23   Eubanks, Jessica K, NP  warfarin (COUMADIN ) 2.5  MG tablet TAKE 1/2 TO 1 TABLET BY MOUTH  DAILY AS DIRECTED BY THE  COUMADIN  CLINIC 04/15/23   [name], MD    Allergies: Compazine [prochlorperazine edisylate]    Review of Systems  Constitutional:  Negative for chills and fever.  HENT:  Negative for congestion, rhinorrhea, sore throat and trouble swallowing.   Respiratory:  Negative for cough and shortness of breath.   Cardiovascular:  Negative for chest pain.  Gastrointestinal:  Negative for abdominal pain, diarrhea and vomiting.  Neurological:  Negative for syncope.  See HPI  Updated Vital Signs BP (!) 151/68   Pulse 62   Temp 98.1 F (36.7 C)   Resp 17   SpO2 98%   Physical Exam Vitals and nursing note reviewed.   Constitutional:      General: He is not in acute distress.    Appearance: He is not toxic-appearing.  HENT:     Head:      Comments: Very small papular noted to the marked area above.  Please see images well.  Minimal erythema, smaller than dime.  Patient has bony prominence present which is symmetrical bilaterally.  No significant swelling noted.  No stinger noted.  Minimal tenderness to palpation.  There is no red streaking noted.  No induration or fluctuance. Eyes:     General: No scleral icterus. Pulmonary:     Effort: Pulmonary effort is normal. No respiratory distress.  Skin:    General: Skin is warm and dry.  Neurological:     Mental Status: He is alert.       (all labs ordered are listed, but only abnormal results are displayed) Labs Reviewed - No data to display  EKG: None  Radiology: No results found.  Procedures   Medications Ordered in the ED - No data to display                              Medical Decision Making  88 y.o. male presents to the ER today for evaluation of insect sting. Differential diagnosis includes but is not limited to cellulitis, allergic reaction, insect sting. Vital signs mildly elevated BP, otherwise unremarkable. Physical exam as noted above.   Patient does not have any systemic symptoms of allergic reaction.  Very minimal erythema noted to the area.  No residual stinger left.  He reports is more sore than it is itchy.  Will see if providing him some ice will help with some of the pain.  On reevaluation, patient reports that ice helped significantly and he is feeling better.  Discussed with him that it may take some time for the area to calm down however he does not appear acutely ill.  He does not have any signs or symptoms of an acute allergic reaction.  No cellulitis.  Do feel he stable for discharge home with Tylenol  and using heat or ice to the area.  Discussed strict return precautions provide symptoms with patient and member in the  room.  They verbalized understanding and agreed with the plan.  Stable for discharge home.  Portions of this report may have been transcribed using voice recognition software. Every effort was made to ensure accuracy; however, inadvertent computerized transcription errors may be present.    Final diagnoses:  Insect bite of right ear, initial encounter    ED Discharge Orders     None          Bernis Ernst, [place] 12/21/23  2341    Yolande Lamar BROCKS, MD 12/23/23 1946

## 2023-12-23 ENCOUNTER — Encounter: Payer: Self-pay | Admitting: Rehabilitation

## 2023-12-23 ENCOUNTER — Ambulatory Visit: Admitting: Rehabilitation

## 2023-12-23 DIAGNOSIS — M4123 Other idiopathic scoliosis, cervicothoracic region: Secondary | ICD-10-CM

## 2023-12-23 DIAGNOSIS — M6281 Muscle weakness (generalized): Secondary | ICD-10-CM

## 2023-12-23 DIAGNOSIS — M542 Cervicalgia: Secondary | ICD-10-CM

## 2023-12-23 DIAGNOSIS — R293 Abnormal posture: Secondary | ICD-10-CM

## 2023-12-23 DIAGNOSIS — R296 Repeated falls: Secondary | ICD-10-CM

## 2023-12-23 DIAGNOSIS — R2681 Unsteadiness on feet: Secondary | ICD-10-CM | POA: Diagnosis not present

## 2023-12-23 NOTE — Therapy (Signed)
 OUTPATIENT PHYSICAL THERAPY NEURO TREATMENT   Patient Name: Brent Griffith MRN: [id number] DOB:[birth year], 88 y.o., male Today's Date: 12/23/2023   PCP: [name], NP REFERRING PROVIDER: [name], NP  END OF SESSION:  PT End of Session - 12/23/23 1504     Visit Number 11    Number of Visits 16    Date for PT Re-Evaluation 12/25/23    Authorization Type [employer]    Authorization Time Period Auth requested 10/30/23    Progress Note Due on Visit 10    PT Start Time 1453    PT Stop Time 1535    PT Time Calculation (min) 42 min    Activity Tolerance Patient tolerated treatment well    Behavior During Therapy WFL for tasks assessed/performed            Past Medical History:  Diagnosis Date   Acute gastric ulcer without mention of hemorrhage, perforation, or obstruction    Allergic rhinitis, cause unspecified    Anemia, unspecified    Arthritis    Asthma    Blood transfusion without reported diagnosis    CAD (coronary artery disease)    Diaphragmatic hernia without mention of obstruction or gangrene    Elevated prostate specific antigen (PSA)    Enlarged prostate    Esophageal reflux    Extrinsic asthma, unspecified    Herpes zoster without mention of complication    Hyperlipidemia    Hypersomnia with sleep apnea, unspecified    Impotence of organic origin    Kyphosis    Lumbago    Obstructive sleep apnea (adult) (pediatric)    Pneumonia    hx of years ago    Senile osteoporosis    Trigger finger (acquired)    Tubular adenoma of colon 05/2013   Unspecified essential hypertension    Unspecified sleep apnea    CPAP- settings 4-12    Unspecified vitamin D deficiency    Past Surgical History:  Procedure Laterality Date   CARDIAC CATHETERIZATION  03/04/2009   Dr Kerrin   COLONOSCOPY     CORONARY ARTERY BYPASS GRAFT  2010   CYSTOSCOPY WITH RETROGRADE PYELOGRAM, URETEROSCOPY AND STENT PLACEMENT Right 10/04/2013   Procedure: CYSTOSCOPY  WITH RETROGRADE PYELOGRAM,  AND STENT PLACEMENT;  Surgeon: Donnice Brooks, MD;  Location: [facility];  Service: Urology;  Laterality: Right;   DENTAL SURGERY N/A    MOLE REMOVAL  1958   chin   ROTATOR CUFF REPAIR Left 04/1998   with bone spur removed, Dr Shari   TONSILLECTOMY  1945   TRANSURETHRAL RESECTION OF PROSTATE N/A 09/24/2013   Procedure: TRANSURETHRAL RESECTION OF THE PROSTATE (TURP) WITH GYRUS (STAGED RIGHT LATERAL AND MEDIAN LOBE);  Surgeon: Arlena LILLETTE Gal, MD;  Location: [facility];  Service: Urology;  Laterality: N/A;   UPPER GI ENDOSCOPY     Patient Active Problem List   Diagnosis Date Noted   Vitamin D deficiency 09/19/2023   Recurrent falls 09/19/2023   CKD (chronic kidney disease) stage 3, GFR 30-59 ml/min (HCC) 07/06/2022   Atrial fibrillation (HCC) 10/25/2021   HFrEF (heart failure with reduced ejection fraction) (HCC) 01/19/2021   CAD (coronary artery disease) 10/11/2017   IDA (iron deficiency anemia) 08/31/2016   Seasonal and perennial allergic rhinitis 08/28/2016   Palpitations 09/07/2015   Cough, persistent 07/20/2015   Senile osteoporosis 06/09/2014   Hydronephrosis of right kidney 10/03/2013   Benign prostatic hyperplasia 09/24/2013   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   Dyslipidemia 04/06/2009  OSA (obstructive sleep apnea) 02/04/2009   Essential hypertension 09/02/2008   GERD (gastroesophageal reflux disease) 09/02/2008    ONSET DATE: Chronic  REFERRING DIAG: R29.6 (ICD-10-CM) - Recurrent falls  THERAPY DIAG:  Unsteadiness on feet  Frequent falls  Cervicalgia  Muscle weakness (generalized)  Abnormal posture  Other idiopathic scoliosis, cervicothoracic region  Rationale for Evaluation and Treatment: Rehabilitation  SUBJECTIVE:                                                                                                                                                                                             SUBJECTIVE STATEMENT: 12/23/23:   Patient reports feels ok, but tired.  States doing a lot of walking around his home this AM.  EVAL Pt comes in due to falls. Has had history of falls for the last 3 years. Pt is on warfarin and goes to coumadin  clinic. Pt reports he normally falls forward. Has had PT in the past but has not been doing his exercises. Feels he has gotten weaker and not walking as far since stopping PT. Normally able to walk more around his neighborhood but currently only able to walk [place] 2-3 times. Recently got his rollator ~1 month ago Pt accompanied by: significant other, [name] (wife)  PERTINENT HISTORY: Wears thoracic/cervical brace at baseline. History of severe coronary artery disease with prior CABG. He has ischemic cardiomyopathy with a mildly reduced ejection fraction. He was diagnosed with atrial fibrillation in September 2022 and was started on amiodarone .   PAIN:  Are you having pain? No  PRECAUTIONS: Fall  RED FLAGS: None   WEIGHT BEARING RESTRICTIONS: No  FALLS: Has patient fallen in last 6 months? Yes. Number of falls at least 2 falls -- last fall on 08/23/23 slid off bed and hit head on his dresser  LIVING ENVIRONMENT: Lives with: lives with their spouse Lives in: House/apartment Stairs: 2 step ups to enter Has following equipment at home: Vannie - 4 wheeled  PLOF: Independent  PATIENT GOALS: Improve balance  OBJECTIVE:  Note: Objective measures were completed at Evaluation unless otherwise noted.  DIAGNOSTIC FINDINGS: CT cervical spine 08/23/23 IMPRESSION: 1. No acute traumatic injury identified in the cervical spine. 2. Chronic osteopenia, upper thoracic compression fractures with ankylosis and kyphosis.    COGNITION: Overall cognitive status: Within functional limits for tasks assessed   SENSATION: WFL  EDEMA:  None  MUSCLE TONE: Did not assess  DTRs:  Did not assess  POSTURE: Increased kyphosis. Wears neck and thoracic brace  LOWER EXTREMITY ROM:      Active  Right Eval Left Eval  Hip  flexion    Hip extension    Hip abduction    Hip adduction    Hip internal rotation    Hip external rotation    Knee flexion    Knee extension    Ankle dorsiflexion    Ankle plantarflexion    Ankle inversion    Ankle eversion     (Blank rows = not tested)  LOWER EXTREMITY MMT:    MMT Right Eval Left Eval RLE LLE RLE 12/19/23 Lle 12/19/23  Hip flexion 5 4 5  4+    Hip extension 4- 3+      Hip abduction 3 3 3+ 4- 4- 4  Hip adduction        Hip internal rotation        Hip external rotation        Knee flexion 5 5 5 5     Knee extension 5 4- 5 5    Ankle dorsiflexion   4- 4-    Ankle plantarflexion        Ankle inversion        Ankle eversion        (Blank rows = not tested)  BED MOBILITY:  SBA for safety  TRANSFERS: Sit to stand: Modified independence  Assistive device utilized: Environmental consultant - 4 wheeled     Stand to sit: Modified independence  Assistive device utilized: hands to bed      RAMP:  Not tested  CURB:  Not tested  STAIRS: Not tested GAIT: Findings: Distance walked: 180' and Comments: Decreased bilat step length, forward head  FUNCTIONAL TESTS:  5 times sit to stand: 13.22 sec using back of legs against bed Mini-BESTest: 14      PATIENT SURVEYS:  ABC scale: The Activities-Specific Balance Confidence (ABC) Scale 0% 10 20 30  40 50 60 70 80 90 100% No confidence<->completely confident  "How confident are you that you will not lose your balance or become unsteady when you . . .   Date tested 10/30/23  Walk around the house 90%  2. Walk up or down stairs 90%  3. Bend over and pick up a slipper from in front of a closet floor 90%  4. Reach for a small can off a shelf at eye level 80%  5. Stand on tip toes and reach for something above your head 90%  6. Stand on a chair and reach for something 0%  7. Sweep the floor 100%  8. Walk outside the house to a car parked in the driveway 80%  9. Get into or out of a car  100%  10. Walk across a parking lot to the mall 100% w/ walker  11. Walk up or down a ramp 80% w/ walker  12. Walk in a crowded mall where people rapidly walk past you 80%  13. Are bumped into by people as you walk through the mall 10%  14. Step onto or off of an escalator while you are holding onto the railing 0%  15. Step onto or off an escalator while holding onto parcels such that you cannot hold onto the railing 0%  16. Walk outside on icy sidewalks 0%  Total: #/16 61.87%  TREATMENT DATE:  12/23/23 THERAPEUTIC EXERCISE: To improve strength.  Demonstration, verbal and tactile cues throughout for technique. Nustep L4 x 5'  NEUROMUSCULAR RE-EDUCATION: To improve balance. 8 step touch x 2/10 BLE Step stance w/ lunges and f/b wt shifting x 10 BLE Step stance x 1' x 2 BLE Star/cone touch 12-5 x 4 laps BLE Step up/over aerobic step Forward x 10 ; laterally x 5 laps  Step/over aerobic step w/ blue foam forward x 8   THERAPEUTIC ACTIVITIES: To improve functional performance.  Demonstration, verbal and tactile cues throughout for technique. Supine hip flexor stretch x 1' x 2 BLE  12/19/23 THERAPEUTIC EXERCISE: To improve strength.  Demonstration, verbal and tactile cues throughout for technique. Nustep L4 x 5'  Physical performance testing:  completed 6 min walk test, mini Best test (see flowsheet section and goals for values)  NEUROMUSCULAR RE-EDUCATION: To improve balance. Incline ramp stance on stabilized rocker board x 1' x 2 with mod assist Sit to stand x 2/5 Gait with SBQC and facilitation for proper sequence, getting all 4 points of cane on floor, slowing pace Gait with SPC with height adjustment, facilitation of sequence and not robot walking with step to gait pattern and CGA for balance  THERAPEUTIC ACTIVITIES: To improve functional performance.   Demonstration, verbal and tactile cues throughout for technique. Rolling s/s on mat table x 2 with vc for engaging transverse abdominals to initial roll Sit to and from supine on mat table with vc for bending knees to roll to side; using underside elbow to dig in and push up  12/17/23 THERAPEUTIC EXERCISE: To improve strength.  Demonstration, verbal and tactile cues throughout for technique. Nustep L5 x 8'  NEUROMUSCULAR RE-EDUCATION: To improve balance and coordination. Tandem stance x 1' BLE Tandem gait F/B X 3 LAPS at counter Stepping over foam rolls obstacle course x 5 laps F/B at counter Diagonal stepping w/ PVC cross x 10 going L; x 10 going R Step stance 8 x 1' BLE Step touch 8 x 10 BLE SLS w/ contralateral toe touch x 1' BLE and manual wt shifting to L SLS w/ only intermittent RUE counter support x 1' BLE with CGA for manual L wt shifting  THERAPEUTIC ACTIVITIES: To improve functional performance.  Demonstration, verbal and tactile cues throughout for technique. Counter back bends x 10 L pelvic sideglides x 10 at counter Seated hamstring stretch x 1' x 2 BLE  12/12/23 THERAPEUTIC EXERCISE: To improve strength.  Demonstration, verbal and tactile cues throughout for technique. UBE L2.5 x 5'  NEUROMUSCULAR RE-EDUCATION: To improve balance and coordination. Tandem stance x 1' BLE Tandem gait F/B X 3 LAPS at counter Diagonal stepping w/ PVC cross x 10 going L; x 10 going R Box stepping PVC cross x 5 laps CW; x 5 CCW Upside down BOSU balance x 1'; heel/toe rocking x 1';  s/s rocking x 1' BOSU balance right side up x 2' Step touch 8 x 10 BLE  THERAPEUTIC ACTIVITIES: To improve functional performance.  Demonstration, verbal and tactile cues throughout for technique. Counter back bends x 10 Supine thoracic extension x 20 Supine bridge x 20 POE x 3' POE with slight pressup w/ elbows x 5  12/05/23 THERAPEUTIC EXERCISE: To improve strength.  Demonstration, verbal and tactile  cues throughout for technique. Nustep L4 x 6'  THERAPEUTIC ACTIVITIES: To improve functional performance.  Demonstration, verbal and tactile cues throughout for technique. Corner pec stretch at 90 x 30 sec x 3 Counter back ext  x 10  Seated ham stretch x 1' x 3 BLE Prone lying x 3'  Prone press up on elbows x 10 Supine cervical and scapular retractionx 20 BUE  NEUROMUSCULAR RE-EDUCATION: To improve balance. Box stepping x 10 laps CW; x 10 CCW; diagonal steps at PVC cross x 5 L and x5 R Pool noodle step over and lunge x 2/8 BLE Toe/heel raises unsupported x 30  Education details: supine hip flexor stretching to HEP Person educated: Patient Education method: Medical illustrator Education comprehension: verbalized understanding, returned demonstration, and needs further education  HOME EXERCISE PROGRAM: Did not yet initiate new HEP. Prior code from last PT episode is below Access Code: [email or web address] URL: [email or web address] Date: 10/30/2023 Prepared by: Gellen April Earnie Starring  Exercises - Sit to Stand with Arms Crossed  - 1 x daily - 7 x weekly - 2 sets - 5 reps - Heel Raises with Counter Support  - 1 x daily - 7 x weekly - 1 sets - 10 reps - Alternating Step Taps with Counter Support  - 1 x daily - 7 x weekly - 1 sets - 10 reps - Forward Step Over with Counter Support  - 1 x daily - 7 x weekly - 1 sets - 10 reps - Push-Up on Counter  - 1 x daily - 7 x weekly - 1 sets - 10 reps - Standing Bent Over Single Arm Scapular Row with Table Support with PLB  - 1 x daily - 7 x weekly - 1 sets - 10 reps - stomping  - 1 x daily - 7 x weekly - 1 sets - 10 reps - Forward Step Up with Counter Support  - 1 x daily - 7 x weekly - 1 sets - 10 reps - Seated Shoulder Row with Anchored Resistance  - 1 x daily - 7 x weekly - 2 sets - 10 reps - Standing Hip Abduction with Counter Support  - 1 x daily - 7 x weekly - 1 sets - 10 reps - Single Leg Stance with Support  - 1 x daily -  7 x weekly - 1 sets - 5 reps - Lunge with Counter Support  - 1 x daily - 7 x weekly - 1 sets - 5 reps  GOALS: Goals reviewed with patient? Yes  SHORT TERM GOALS: Target date: 11/27/2023   Pt will be ind with initial HEP Baseline: Goal status: MET  2.  Pt will have improved TUG to </=13 sec Baseline: 15.03 Goal status: PROGRESSING- 14.13 sec (11/25/23)  3.  PT will obtain 6 min walk test baseline Baseline:  Goal status: MET- 7/15 25   LONG TERM GOALS: Target date: 12/25/2023   Pt will be ind with management and progression of HEP Baseline: 12/17/23 can teach back HEP today Goal status: MET  2.  Pt will have improved mini BESTest by 4 points to demo MCID Baseline: 14 12/19/23:  16 Goal status: IN PROGRESS  3.  Pt will demo at least 4/5 bilat LE strength for increased strength and stability Baseline:  Goal status: PROGRESSING; 11/27/23 hip abductors are improved from 3 to 3+ LLE and 3 to 4- on RLE 12/19/23: 4- LLE; 4 RLE  4.  Pt will have improved ABC score to >/=67% to be better than cut-off score for balance impairments Baseline: 61.87% 12/19/23-71.3% Goal status: IN PROGRESS  5.  Pt will have improved 6 min walk test by at least 160' to demo MCID for increased endurance Baseline:  12/19/23 = 1500 FEET Goal status: MET; adequate for community ambulation in and out of stores with rollator with seat    ASSESSMENT:  CLINICAL IMPRESSION: 12/23/23  Patient is more fatigued today than usual and requires frequent rest breaks.  Deferred balance activities on incline ramp/rocker board today, but will plan for next visit.  Advanced him with star touch activities and he does well, but still lacks balance on LLE for SLS.   Decreased ability to wt shifting to the L continues to be a focus of therapy.  PT remains necessary as he has not yet met goals for gait, balance, strength.  Continue per POC    Patient is a 88 y.o. M who was seen today for physical therapy evaluation and treatment  for falls. PMH is significant for multiple falls, CAD with prior CABG, and increased kyphosis. Assessment demos general weakness, abnormal posture, decreased endurance and high fall risk with history of multiple falls. Pt will benefit from PT to address these deficits to maximize his level of safety.    OBJECTIVE IMPAIRMENTS: Abnormal gait, decreased activity tolerance, decreased balance, decreased endurance, decreased mobility, difficulty walking, decreased strength, improper body mechanics, and postural dysfunction.   ACTIVITY LIMITATIONS: standing, squatting, stairs, transfers, and locomotion level  PARTICIPATION LIMITATIONS: cleaning, shopping, and community activity  PERSONAL FACTORS: Age, Fitness, Past/current experiences, and Time since onset of injury/illness/exacerbation are also affecting patient's functional outcome.   REHAB POTENTIAL: Good  CLINICAL DECISION MAKING: Evolving/moderate complexity  EVALUATION COMPLEXITY: Moderate  PLAN:  PT FREQUENCY: 2x/week  PT DURATION: 10 weeks  PLANNED INTERVENTIONS: 97164- PT Re-evaluation, 97750- Physical Performance Testing, 97110-Therapeutic exercises, 97530- Therapeutic activity, [id number]- Neuromuscular re-education, 97535- Self Care, [id number]- Manual therapy, [phone]- Gait training, [phone]- Aquatic Therapy, [phone]- Electrical stimulation (unattended), [id number]- Ultrasound, [id number]- Ionotophoresis 4mg /ml Dexamethasone , 79439 (1-2 muscles), [id number] (3+ muscles)- Dry Needling, Patient/Family education, Balance training, Stair training, Taping, Joint mobilization, Cryotherapy, and Moist heat  PLAN FOR NEXT SESSION: Work on incline and decline ramp balance using rocker board with weights underneath or other stabilized ramp  Kameo Bains, PT 12/23/2023, 4:00 PM

## 2023-12-26 ENCOUNTER — Ambulatory Visit: Admitting: Rehabilitation

## 2023-12-26 ENCOUNTER — Encounter: Payer: Self-pay | Admitting: Rehabilitation

## 2023-12-26 DIAGNOSIS — M4123 Other idiopathic scoliosis, cervicothoracic region: Secondary | ICD-10-CM

## 2023-12-26 DIAGNOSIS — M6281 Muscle weakness (generalized): Secondary | ICD-10-CM | POA: Diagnosis not present

## 2023-12-26 DIAGNOSIS — R296 Repeated falls: Secondary | ICD-10-CM

## 2023-12-26 DIAGNOSIS — R293 Abnormal posture: Secondary | ICD-10-CM | POA: Diagnosis not present

## 2023-12-26 DIAGNOSIS — M542 Cervicalgia: Secondary | ICD-10-CM | POA: Diagnosis not present

## 2023-12-26 DIAGNOSIS — R2681 Unsteadiness on feet: Secondary | ICD-10-CM | POA: Diagnosis not present

## 2023-12-26 NOTE — Therapy (Signed)
 OUTPATIENT PHYSICAL THERAPY NEURO TREATMENT   Patient Name: Brent Griffith MRN: [id number] DOB:Apr 27, 1936, 88 y.o., male Today's Date: 12/26/2023   PCP: [name], NP REFERRING PROVIDER: [name], NP  END OF SESSION:  PT End of Session - 12/26/23 1410     Visit Number 12    Number of Visits 16    Date for PT Re-Evaluation 12/25/23    Authorization Type [employer]    Authorization Time Period Auth requested 10/30/23    Progress Note Due on Visit 10    PT Start Time 1401    PT Stop Time 1444    PT Time Calculation (min) 43 min    Activity Tolerance Patient tolerated treatment well    Behavior During Therapy WFL for tasks assessed/performed            Past Medical History:  Diagnosis Date   Acute gastric ulcer without mention of hemorrhage, perforation, or obstruction    Allergic rhinitis, cause unspecified    Anemia, unspecified    Arthritis    Asthma    Blood transfusion without reported diagnosis    CAD (coronary artery disease)    Diaphragmatic hernia without mention of obstruction or gangrene    Elevated prostate specific antigen (PSA)    Enlarged prostate    Esophageal reflux    Extrinsic asthma, unspecified    Herpes zoster without mention of complication    Hyperlipidemia    Hypersomnia with sleep apnea, unspecified    Impotence of organic origin    Kyphosis    Lumbago    Obstructive sleep apnea (adult) (pediatric)    Pneumonia    hx of years ago    Senile osteoporosis    Trigger finger (acquired)    Tubular adenoma of colon 05/2013   Unspecified essential hypertension    Unspecified sleep apnea    CPAP- settings 4-12    Unspecified vitamin D deficiency    Past Surgical History:  Procedure Laterality Date   CARDIAC CATHETERIZATION  03/04/2009   Dr Kerrin   COLONOSCOPY     CORONARY ARTERY BYPASS GRAFT  2010   CYSTOSCOPY WITH RETROGRADE PYELOGRAM, URETEROSCOPY AND STENT PLACEMENT Right 10/04/2013   Procedure: CYSTOSCOPY  WITH RETROGRADE PYELOGRAM,  AND STENT PLACEMENT;  Surgeon: Donnice Brooks, MD;  Location: [facility];  Service: Urology;  Laterality: Right;   DENTAL SURGERY N/A    MOLE REMOVAL  1958   chin   ROTATOR CUFF REPAIR Left 04/1998   with bone spur removed, Dr Shari   TONSILLECTOMY  1945   TRANSURETHRAL RESECTION OF PROSTATE N/A 09/24/2013   Procedure: TRANSURETHRAL RESECTION OF THE PROSTATE (TURP) WITH GYRUS (STAGED RIGHT LATERAL AND MEDIAN LOBE);  Surgeon: Arlena LILLETTE Gal, MD;  Location: [facility];  Service: Urology;  Laterality: N/A;   UPPER GI ENDOSCOPY     Patient Active Problem List   Diagnosis Date Noted   Vitamin D deficiency 09/19/2023   Recurrent falls 09/19/2023   CKD (chronic kidney disease) stage 3, GFR 30-59 ml/min (HCC) 07/06/2022   Atrial fibrillation (HCC) 10/25/2021   HFrEF (heart failure with reduced ejection fraction) (HCC) 01/19/2021   CAD (coronary artery disease) 10/11/2017   IDA (iron deficiency anemia) 08/31/2016   Seasonal and perennial allergic rhinitis 08/28/2016   Palpitations 09/07/2015   Cough, persistent 07/20/2015   Senile osteoporosis 06/09/2014   Hydronephrosis of right kidney 10/03/2013   Benign prostatic hyperplasia 09/24/2013   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   Dyslipidemia 04/06/2009  OSA (obstructive sleep apnea) 02/04/2009   Essential hypertension 09/02/2008   GERD (gastroesophageal reflux disease) 09/02/2008    ONSET DATE: Chronic  REFERRING DIAG: R29.6 (ICD-10-CM) - Recurrent falls  THERAPY DIAG:  Unsteadiness on feet  Abnormal posture  Frequent falls  Cervicalgia  Muscle weakness (generalized)  Other idiopathic scoliosis, cervicothoracic region  Rationale for Evaluation and Treatment: Rehabilitation  SUBJECTIVE:                                                                                                                                                                                             SUBJECTIVE STATEMENT: States feels  pretty good today.  Denies falls since last visit.   EVAL Pt comes in due to falls. Has had history of falls for the last 3 years. Pt is on warfarin and goes to coumadin  clinic. Pt reports he normally falls forward. Has had PT in the past but has not been doing his exercises. Feels he has gotten weaker and not walking as far since stopping PT. Normally able to walk more around his neighborhood but currently only able to walk [place] 2-3 times. Recently got his rollator ~1 month ago Pt accompanied by: significant other, Brent Griffith (wife)  PERTINENT HISTORY: Wears thoracic/cervical brace at baseline. History of severe coronary artery disease with prior CABG. He has ischemic cardiomyopathy with a mildly reduced ejection fraction. He was diagnosed with atrial fibrillation in September 2022 and was started on amiodarone .   PAIN:  Are you having pain? No  PRECAUTIONS: Fall  RED FLAGS: None   WEIGHT BEARING RESTRICTIONS: No  FALLS: Has patient fallen in last 6 months? Yes. Number of falls at least 2 falls -- last fall on 08/23/23 slid off bed and hit head on his dresser  LIVING ENVIRONMENT: Lives with: lives with their spouse Lives in: House/apartment Stairs: 2 step ups to enter Has following equipment at home: Vannie - 4 wheeled  PLOF: Independent  PATIENT GOALS: Improve balance  OBJECTIVE:  Note: Objective measures were completed at Evaluation unless otherwise noted.  DIAGNOSTIC FINDINGS: CT cervical spine 08/23/23 IMPRESSION: 1. No acute traumatic injury identified in the cervical spine. 2. Chronic osteopenia, upper thoracic compression fractures with ankylosis and kyphosis.    COGNITION: Overall cognitive status: Within functional limits for tasks assessed   SENSATION: WFL  EDEMA:  None  MUSCLE TONE: Did not assess  DTRs:  Did not assess  POSTURE: Increased kyphosis. Wears neck and thoracic brace  LOWER EXTREMITY ROM:     Active  Right Eval Left Eval  Hip  flexion    Hip extension  Hip abduction    Hip adduction    Hip internal rotation    Hip external rotation    Knee flexion    Knee extension    Ankle dorsiflexion    Ankle plantarflexion    Ankle inversion    Ankle eversion     (Blank rows = not tested)  LOWER EXTREMITY MMT:    MMT Right Eval Left Eval RLE LLE RLE 12/19/23 Lle 12/19/23  Hip flexion 5 4 5  4+    Hip extension 4- 3+      Hip abduction 3 3 3+ 4- 4- 4  Hip adduction        Hip internal rotation        Hip external rotation        Knee flexion 5 5 5 5     Knee extension 5 4- 5 5    Ankle dorsiflexion   4- 4-    Ankle plantarflexion        Ankle inversion        Ankle eversion        (Blank rows = not tested)  BED MOBILITY:  SBA for safety  TRANSFERS: Sit to stand: Modified independence  Assistive device utilized: Environmental consultant - 4 wheeled     Stand to sit: Modified independence  Assistive device utilized: hands to bed      RAMP:  Not tested  CURB:  Not tested  STAIRS: Not tested GAIT: Findings: Distance walked: 180' and Comments: Decreased bilat step length, forward head  FUNCTIONAL TESTS:  5 times sit to stand: 13.22 sec using back of legs against bed Mini-BESTest: 14      PATIENT SURVEYS:  ABC scale: The Activities-Specific Balance Confidence (ABC) Scale 0% 10 20 30  40 50 60 70 80 90 100% No confidence<->completely confident  "How confident are you that you will not lose your balance or become unsteady when you . . .   Date tested 10/30/23  Walk around the house 90%  2. Walk up or down stairs 90%  3. Bend over and pick up a slipper from in front of a closet floor 90%  4. Reach for a small can off a shelf at eye level 80%  5. Stand on tip toes and reach for something above your head 90%  6. Stand on a chair and reach for something 0%  7. Sweep the floor 100%  8. Walk outside the house to a car parked in the driveway 80%  9. Get into or out of a car 100%  10. Walk across a parking lot to  the mall 100% w/ walker  11. Walk up or down a ramp 80% w/ walker  12. Walk in a crowded mall where people rapidly walk past you 80%  13. Are bumped into by people as you walk through the mall 10%  14. Step onto or off of an escalator while you are holding onto the railing 0%  15. Step onto or off an escalator while holding onto parcels such that you cannot hold onto the railing 0%  16. Walk outside on icy sidewalks 0%  Total: #/16 61.87%  TREATMENT DATE:  12/26/23 THERAPEUTIC EXERCISE: To improve flexibility.  Demonstration, verbal and tactile cues throughout for technique. NuStep L5 x 8'  THERAPEUTIC ACTIVITIES: To improve functional performance.  Demonstration, verbal and tactile cues throughout for technique. Seated hip flexor stretch x 1' x 2 BLE Seated ham stretch x 1' x 2 BLE Supine bridging w/ RTB to knees x 20  SL hip abd x 20 BLE Prone lying x 2' with deep breathing POE with deep breathing and scap protract/retraction x 10 reps  NEUROMUSCULAR RE-EDUCATION: To improve balance. Star/clock cone touches 12-6:00 x5 laps BLE SLS on LLE with CGA for wt shifting to the L Rocker board balance on incline and decline x 2' each with manual wt shifting  12/23/23 THERAPEUTIC EXERCISE: To improve strength.  Demonstration, verbal and tactile cues throughout for technique. Nustep L4 x 5'  NEUROMUSCULAR RE-EDUCATION: To improve balance. 8 step touch x 2/10 BLE Step stance w/ lunges and f/b wt shifting x 10 BLE Step stance x 1' x 2 BLE Star/cone touch 12-5 x 4 laps BLE Step up/over aerobic step Forward x 10 ; laterally x 5 laps  Step/over aerobic step w/ blue foam forward x 8   THERAPEUTIC ACTIVITIES: To improve functional performance.  Demonstration, verbal and tactile cues throughout for technique. Supine hip flexor stretch x 1' x 2  BLE  12/19/23 THERAPEUTIC EXERCISE: To improve strength.  Demonstration, verbal and tactile cues throughout for technique. Nustep L4 x 5'  Physical performance testing:  completed 6 min walk test, mini Best test (see flowsheet section and goals for values)  NEUROMUSCULAR RE-EDUCATION: To improve balance. Incline ramp stance on stabilized rocker board x 1' x 2 with mod assist Sit to stand x 2/5 Gait with SBQC and facilitation for proper sequence, getting all 4 points of cane on floor, slowing pace Gait with SPC with height adjustment, facilitation of sequence and not robot walking with step to gait pattern and CGA for balance  THERAPEUTIC ACTIVITIES: To improve functional performance.  Demonstration, verbal and tactile cues throughout for technique. Rolling s/s on mat table x 2 with vc for engaging transverse abdominals to initial roll Sit to and from supine on mat table with vc for bending knees to roll to side; using underside elbow to dig in and push up  12/17/23 THERAPEUTIC EXERCISE: To improve strength.  Demonstration, verbal and tactile cues throughout for technique. Nustep L5 x 8'  NEUROMUSCULAR RE-EDUCATION: To improve balance and coordination. Tandem stance x 1' BLE Tandem gait F/B X 3 LAPS at counter Stepping over foam rolls obstacle course x 5 laps F/B at counter Diagonal stepping w/ PVC cross x 10 going L; x 10 going R Step stance 8 x 1' BLE Step touch 8 x 10 BLE SLS w/ contralateral toe touch x 1' BLE and manual wt shifting to L SLS w/ only intermittent RUE counter support x 1' BLE with CGA for manual L wt shifting  THERAPEUTIC ACTIVITIES: To improve functional performance.  Demonstration, verbal and tactile cues throughout for technique. Counter back bends x 10 L pelvic sideglides x 10 at counter Seated hamstring stretch x 1' x 2 BLE  12/12/23 THERAPEUTIC EXERCISE: To improve strength.  Demonstration, verbal and tactile cues throughout for technique. UBE L2.5 x  5'  NEUROMUSCULAR RE-EDUCATION: To improve balance and coordination. Tandem stance x 1' BLE Tandem gait F/B X 3 LAPS at counter Diagonal stepping w/ PVC cross x 10 going L; x 10 going R Box stepping PVC cross x 5  laps CW; x 5 CCW Upside down BOSU balance x 1'; heel/toe rocking x 1';  s/s rocking x 1' BOSU balance right side up x 2' Step touch 8 x 10 BLE  THERAPEUTIC ACTIVITIES: To improve functional performance.  Demonstration, verbal and tactile cues throughout for technique. Counter back bends x 10 Supine thoracic extension x 20 Supine bridge x 20 POE x 3' POE with slight pressup w/ elbows x 5  12/05/23 THERAPEUTIC EXERCISE: To improve strength.  Demonstration, verbal and tactile cues throughout for technique. Nustep L4 x 6'  THERAPEUTIC ACTIVITIES: To improve functional performance.  Demonstration, verbal and tactile cues throughout for technique. Corner pec stretch at 90 x 30 sec x 3 Counter back ext x 10  Seated ham stretch x 1' x 3 BLE Prone lying x 3'  Prone press up on elbows x 10 Supine cervical and scapular retractionx 20 BUE  NEUROMUSCULAR RE-EDUCATION: To improve balance. Box stepping x 10 laps CW; x 10 CCW; diagonal steps at PVC cross x 5 L and x5 R Pool noodle step over and lunge x 2/8 BLE Toe/heel raises unsupported x 30  Education details: supine hip flexor stretching to HEP Person educated: Patient Education method: Medical illustrator Education comprehension: verbalized understanding, returned demonstration, and needs further education  HOME EXERCISE PROGRAM: Did not yet initiate new HEP. Prior code from last PT episode is below Access Code: [email or web address] URL: [email or web address] Date: 10/30/2023 Prepared by: Gellen April Earnie Starring  Exercises - Sit to Stand with Arms Crossed  - 1 x daily - 7 x weekly - 2 sets - 5 reps - Heel Raises with Counter Support  - 1 x daily - 7 x weekly - 1 sets - 10 reps - Alternating Step Taps with  Counter Support  - 1 x daily - 7 x weekly - 1 sets - 10 reps - Forward Step Over with Counter Support  - 1 x daily - 7 x weekly - 1 sets - 10 reps - Push-Up on Counter  - 1 x daily - 7 x weekly - 1 sets - 10 reps - Standing Bent Over Single Arm Scapular Row with Table Support with PLB  - 1 x daily - 7 x weekly - 1 sets - 10 reps - stomping  - 1 x daily - 7 x weekly - 1 sets - 10 reps - Forward Step Up with Counter Support  - 1 x daily - 7 x weekly - 1 sets - 10 reps - Seated Shoulder Row with Anchored Resistance  - 1 x daily - 7 x weekly - 2 sets - 10 reps - Standing Hip Abduction with Counter Support  - 1 x daily - 7 x weekly - 1 sets - 10 reps - Single Leg Stance with Support  - 1 x daily - 7 x weekly - 1 sets - 5 reps - Lunge with Counter Support  - 1 x daily - 7 x weekly - 1 sets - 5 reps  GOALS: Goals reviewed with patient? Yes  SHORT TERM GOALS: Target date: 11/27/2023   Pt will be ind with initial HEP Baseline: Goal status: MET  2.  Pt will have improved TUG to </=13 sec Baseline: 15.03 Goal status: PROGRESSING- 14.13 sec (11/25/23)  3.  PT will obtain 6 min walk test baseline Baseline:  Goal status: MET- 7/15 25   LONG TERM GOALS: Target date: 12/25/2023   Pt will be ind with management and progression of HEP Baseline: 12/17/23 can teach  back HEP today Goal status: MET  2.  Pt will have improved mini BESTest by 4 points to demo MCID Baseline: 14 12/19/23:  16 Goal status: IN PROGRESS  3.  Pt will demo at least 4/5 bilat LE strength for increased strength and stability Baseline:  Goal status: PROGRESSING; 11/27/23 hip abductors are improved from 3 to 3+ LLE and 3 to 4- on RLE 12/19/23: 4- LLE; 4 RLE  4.  Pt will have improved ABC score to >/=67% to be better than cut-off score for balance impairments Baseline: 61.87% 12/19/23-71.3% Goal status: IN PROGRESS  5.  Pt will have improved 6 min walk test by at least 160' to demo MCID for increased endurance Baseline:   12/19/23 = 1500 FEET Goal status: MET; adequate for community ambulation in and out of stores with rollator with seat    ASSESSMENT:  CLINICAL IMPRESSION: Patient is not as fatigued today as previous session.  Participates well.  Continue focus on LLE stance stability with SLS and other activities.  RLE stance stability is very good compared to initial visit.  However, he still lacks stability on the LLE and has decreased wt shift to the L.   He is seeing this and working on it at home and is some better today although it would be difficult to quantify the improvement.  There is still room for more improvement which will translate into improved balance.   Continue per POC  Patient is a 88 y.o. M who was seen today for physical therapy evaluation and treatment for falls. PMH is significant for multiple falls, CAD with prior CABG, and increased kyphosis. Assessment demos general weakness, abnormal posture, decreased endurance and high fall risk with history of multiple falls. Pt will benefit from PT to address these deficits to maximize his level of safety.    OBJECTIVE IMPAIRMENTS: Abnormal gait, decreased activity tolerance, decreased balance, decreased endurance, decreased mobility, difficulty walking, decreased strength, improper body mechanics, and postural dysfunction.   ACTIVITY LIMITATIONS: standing, squatting, stairs, transfers, and locomotion level  PARTICIPATION LIMITATIONS: cleaning, shopping, and community activity  PERSONAL FACTORS: Age, Fitness, Past/current experiences, and Time since onset of injury/illness/exacerbation are also affecting patient's functional outcome.   REHAB POTENTIAL: Good  CLINICAL DECISION MAKING: Evolving/moderate complexity  EVALUATION COMPLEXITY: Moderate  PLAN:  PT FREQUENCY: 2x/week  PT DURATION: 10 weeks  PLANNED INTERVENTIONS: 97164- PT Re-evaluation, 97750- Physical Performance Testing, 97110-Therapeutic exercises, 97530- Therapeutic  activity, 97112- Neuromuscular re-education, 97535- Self Care, [id number]- Manual therapy, [phone]- Gait training, [phone]- Aquatic Therapy, [phone]- Electrical stimulation (unattended), [id number]- Ultrasound, [id number]- Ionotophoresis 4mg /ml Dexamethasone , 79439 (1-2 muscles), [id number] (3+ muscles)- Dry Needling, Patient/Family education, Balance training, Stair training, Taping, Joint mobilization, Cryotherapy, and Moist heat  PLAN FOR NEXT SESSION: Continue with SLS stance on LLE; rocker board ramp activities  Taiki Buckwalter, PT 12/26/2023, 2:40 PM

## 2023-12-27 ENCOUNTER — Ambulatory Visit: Attending: Cardiology

## 2023-12-27 DIAGNOSIS — Z5181 Encounter for therapeutic drug level monitoring: Secondary | ICD-10-CM | POA: Diagnosis not present

## 2023-12-27 DIAGNOSIS — I4891 Unspecified atrial fibrillation: Secondary | ICD-10-CM | POA: Diagnosis not present

## 2023-12-27 LAB — POCT INR: INR: 1.7 — AB (ref 2.0–3.0)

## 2023-12-27 NOTE — Patient Instructions (Signed)
 Take another 1 tablet today only then continue taking 1/2 tablet daily, EXCEPT 1 tablet on Fridays.  Stay consistent with boost/ensure (Mon, Wed, and Fri)  Recheck INR in 3 weeks. Coumadin  Clinic [phone]

## 2023-12-27 NOTE — Progress Notes (Signed)
 INR 1.7Please see anticoagulation encounter Take another 1 tablet today only then continue taking 1/2 tablet daily, EXCEPT 1 tablet on Fridays.  Stay consistent with boost/ensure (Mon, Wed, and Fri)  Recheck INR in 3 weeks. Coumadin  Clinic [phone]

## 2023-12-29 ENCOUNTER — Other Ambulatory Visit: Payer: Self-pay | Admitting: Cardiology

## 2023-12-29 DIAGNOSIS — I4891 Unspecified atrial fibrillation: Secondary | ICD-10-CM

## 2023-12-29 DIAGNOSIS — I10 Essential (primary) hypertension: Secondary | ICD-10-CM

## 2023-12-30 ENCOUNTER — Ambulatory Visit

## 2023-12-30 DIAGNOSIS — M542 Cervicalgia: Secondary | ICD-10-CM

## 2023-12-30 DIAGNOSIS — R293 Abnormal posture: Secondary | ICD-10-CM

## 2023-12-30 DIAGNOSIS — R296 Repeated falls: Secondary | ICD-10-CM

## 2023-12-30 DIAGNOSIS — R2681 Unsteadiness on feet: Secondary | ICD-10-CM

## 2023-12-30 DIAGNOSIS — M4123 Other idiopathic scoliosis, cervicothoracic region: Secondary | ICD-10-CM | POA: Diagnosis not present

## 2023-12-30 DIAGNOSIS — M6281 Muscle weakness (generalized): Secondary | ICD-10-CM | POA: Diagnosis not present

## 2023-12-30 NOTE — Therapy (Signed)
 OUTPATIENT PHYSICAL THERAPY NEURO TREATMENT   Patient Name: ELGIE MAZIARZ MRN: [id number] DOB:02-05-1936, 88 y.o., male Today's Date: 12/30/2023   PCP: [name], NP REFERRING PROVIDER: [name], NP  END OF SESSION:  PT End of Session - 12/30/23 1523     Visit Number 13    Number of Visits 16    Date for PT Re-Evaluation 12/25/23    Authorization Type [employer]    Authorization Time Period Auth requested 10/30/23    Authorization - Visit Number 7    Progress Note Due on Visit 10    PT Start Time 1452   pt late   PT Stop Time 1534    PT Time Calculation (min) 42 min    Activity Tolerance Patient tolerated treatment well    Behavior During Therapy WFL for tasks assessed/performed             Past Medical History:  Diagnosis Date   Acute gastric ulcer without mention of hemorrhage, perforation, or obstruction    Allergic rhinitis, cause unspecified    Anemia, unspecified    Arthritis    Asthma    Blood transfusion without reported diagnosis    CAD (coronary artery disease)    Diaphragmatic hernia without mention of obstruction or gangrene    Elevated prostate specific antigen (PSA)    Enlarged prostate    Esophageal reflux    Extrinsic asthma, unspecified    Herpes zoster without mention of complication    Hyperlipidemia    Hypersomnia with sleep apnea, unspecified    Impotence of organic origin    Kyphosis    Lumbago    Obstructive sleep apnea (adult) (pediatric)    Pneumonia    hx of years ago    Senile osteoporosis    Trigger finger (acquired)    Tubular adenoma of colon 05/2013   Unspecified essential hypertension    Unspecified sleep apnea    CPAP- settings 4-12    Unspecified vitamin D deficiency    Past Surgical History:  Procedure Laterality Date   CARDIAC CATHETERIZATION  03/04/2009   Dr Kerrin   COLONOSCOPY     CORONARY ARTERY BYPASS GRAFT  2010   CYSTOSCOPY WITH RETROGRADE PYELOGRAM, URETEROSCOPY AND STENT  PLACEMENT Right 10/04/2013   Procedure: CYSTOSCOPY WITH RETROGRADE PYELOGRAM,  AND STENT PLACEMENT;  Surgeon: Donnice Brooks, MD;  Location: [facility];  Service: Urology;  Laterality: Right;   DENTAL SURGERY N/A    MOLE REMOVAL  1958   chin   ROTATOR CUFF REPAIR Left 04/1998   with bone spur removed, Dr Shari   TONSILLECTOMY  1945   TRANSURETHRAL RESECTION OF PROSTATE N/A 09/24/2013   Procedure: TRANSURETHRAL RESECTION OF THE PROSTATE (TURP) WITH GYRUS (STAGED RIGHT LATERAL AND MEDIAN LOBE);  Surgeon: Arlena LILLETTE Gal, MD;  Location: [facility];  Service: Urology;  Laterality: N/A;   UPPER GI ENDOSCOPY     Patient Active Problem List   Diagnosis Date Noted   Vitamin D deficiency 09/19/2023   Recurrent falls 09/19/2023   CKD (chronic kidney disease) stage 3, GFR 30-59 ml/min (HCC) 07/06/2022   Atrial fibrillation (HCC) 10/25/2021   HFrEF (heart failure with reduced ejection fraction) (HCC) 01/19/2021   CAD (coronary artery disease) 10/11/2017   IDA (iron deficiency anemia) 08/31/2016   Seasonal and perennial allergic rhinitis 08/28/2016   Palpitations 09/07/2015   Cough, persistent 07/20/2015   Senile osteoporosis 06/09/2014   Hydronephrosis of right kidney 10/03/2013   Benign prostatic hyperplasia 09/24/2013  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   Dyslipidemia 04/06/2009   OSA (obstructive sleep apnea) 02/04/2009   Essential hypertension 09/02/2008   GERD (gastroesophageal reflux disease) 09/02/2008    ONSET DATE: Chronic  REFERRING DIAG: R29.6 (ICD-10-CM) - Recurrent falls  THERAPY DIAG:  Unsteadiness on feet  Abnormal posture  Frequent falls  Cervicalgia  Muscle weakness (generalized)  Rationale for Evaluation and Treatment: Rehabilitation  SUBJECTIVE:                                                                                                                                                                                             SUBJECTIVE STATEMENT: States feels  pretty good today.  Denies falls since last visit.   EVAL Pt comes in due to falls. Has had history of falls for the last 3 years. Pt is on warfarin and goes to coumadin  clinic. Pt reports he normally falls forward. Has had PT in the past but has not been doing his exercises. Feels he has gotten weaker and not walking as far since stopping PT. Normally able to walk more around his neighborhood but currently only able to walk [place] 2-3 times. Recently got his rollator ~1 month ago Pt accompanied by: significant other, [name] (wife)  PERTINENT HISTORY: Wears thoracic/cervical brace at baseline. History of severe coronary artery disease with prior CABG. He has ischemic cardiomyopathy with a mildly reduced ejection fraction. He was diagnosed with atrial fibrillation in September 2022 and was started on amiodarone .   PAIN:  Are you having pain? No  PRECAUTIONS: Fall  RED FLAGS: None   WEIGHT BEARING RESTRICTIONS: No  FALLS: Has patient fallen in last 6 months? Yes. Number of falls at least 2 falls -- last fall on 08/23/23 slid off bed and hit head on his dresser  LIVING ENVIRONMENT: Lives with: lives with their spouse Lives in: House/apartment Stairs: 2 step ups to enter Has following equipment at home: Vannie - 4 wheeled  PLOF: Independent  PATIENT GOALS: Improve balance  OBJECTIVE:  Note: Objective measures were completed at Evaluation unless otherwise noted.  DIAGNOSTIC FINDINGS: CT cervical spine 08/23/23 IMPRESSION: 1. No acute traumatic injury identified in the cervical spine. 2. Chronic osteopenia, upper thoracic compression fractures with ankylosis and kyphosis.    COGNITION: Overall cognitive status: Within functional limits for tasks assessed   SENSATION: WFL  EDEMA:  None  MUSCLE TONE: Did not assess  DTRs:  Did not assess  POSTURE: Increased kyphosis. Wears neck and thoracic brace  LOWER EXTREMITY ROM:     Active  Right Eval Left Eval  Hip  flexion  Hip extension    Hip abduction    Hip adduction    Hip internal rotation    Hip external rotation    Knee flexion    Knee extension    Ankle dorsiflexion    Ankle plantarflexion    Ankle inversion    Ankle eversion     (Blank rows = not tested)  LOWER EXTREMITY MMT:    MMT Right Eval Left Eval RLE LLE RLE 12/19/23 Lle 12/19/23  Hip flexion 5 4 5  4+    Hip extension 4- 3+      Hip abduction 3 3 3+ 4- 4- 4  Hip adduction        Hip internal rotation        Hip external rotation        Knee flexion 5 5 5 5     Knee extension 5 4- 5 5    Ankle dorsiflexion   4- 4-    Ankle plantarflexion        Ankle inversion        Ankle eversion        (Blank rows = not tested)  BED MOBILITY:  SBA for safety  TRANSFERS: Sit to stand: Modified independence  Assistive device utilized: Environmental consultant - 4 wheeled     Stand to sit: Modified independence  Assistive device utilized: hands to bed      RAMP:  Not tested  CURB:  Not tested  STAIRS: Not tested GAIT: Findings: Distance walked: 180' and Comments: Decreased bilat step length, forward head  FUNCTIONAL TESTS:  5 times sit to stand: 13.22 sec using back of legs against bed Mini-BESTest: 14      PATIENT SURVEYS:  ABC scale: The Activities-Specific Balance Confidence (ABC) Scale 0% 10 20 30  40 50 60 70 80 90 100% No confidence<->completely confident  "How confident are you that you will not lose your balance or become unsteady when you . . .   Date tested 10/30/23  Walk around the house 90%  2. Walk up or down stairs 90%  3. Bend over and pick up a slipper from in front of a closet floor 90%  4. Reach for a small can off a shelf at eye level 80%  5. Stand on tip toes and reach for something above your head 90%  6. Stand on a chair and reach for something 0%  7. Sweep the floor 100%  8. Walk outside the house to a car parked in the driveway 80%  9. Get into or out of a car 100%  10. Walk across a parking lot to  the mall 100% w/ walker  11. Walk up or down a ramp 80% w/ walker  12. Walk in a crowded mall where people rapidly walk past you 80%  13. Are bumped into by people as you walk through the mall 10%  14. Step onto or off of an escalator while you are holding onto the railing 0%  15. Step onto or off an escalator while holding onto parcels such that you cannot hold onto the railing 0%  16. Walk outside on icy sidewalks 0%  Total: #/16 61.87%  TREATMENT DATE:  12/30/23 THERAPEUTIC EXERCISE: To improve flexibility.  Demonstration, verbal and tactile cues throughout for technique. NuStep L5 x 6' Gait around clinic 3 laps 90 ft each with rollator  THERAPEUTIC ACTIVITIES: To improve functional performance.  Demonstration, verbal and tactile cues throughout for technique. Side lunges x 10 BLE- cues needed to correct form Fwd lunges x 10 BLE-  Lateral step ups 6' BLE x 20  Supine bridging w/ GTB to knees x 20  Supine bent knee fallout GTB 20x3     12/23/23 THERAPEUTIC EXERCISE: To improve strength.  Demonstration, verbal and tactile cues throughout for technique. Nustep L4 x 5'  NEUROMUSCULAR RE-EDUCATION: To improve balance. 8 step touch x 2/10 BLE Step stance w/ lunges and f/b wt shifting x 10 BLE Step stance x 1' x 2 BLE Star/cone touch 12-5 x 4 laps BLE Step up/over aerobic step Forward x 10 ; laterally x 5 laps  Step/over aerobic step w/ blue foam forward x 8   THERAPEUTIC ACTIVITIES: To improve functional performance.  Demonstration, verbal and tactile cues throughout for technique. Supine hip flexor stretch x 1' x 2 BLE  12/19/23 THERAPEUTIC EXERCISE: To improve strength.  Demonstration, verbal and tactile cues throughout for technique. Nustep L4 x 5'  Physical performance testing:  completed 6 min walk test, mini Best test (see flowsheet section and  goals for values)  NEUROMUSCULAR RE-EDUCATION: To improve balance. Incline ramp stance on stabilized rocker board x 1' x 2 with mod assist Sit to stand x 2/5 Gait with SBQC and facilitation for proper sequence, getting all 4 points of cane on floor, slowing pace Gait with SPC with height adjustment, facilitation of sequence and not robot walking with step to gait pattern and CGA for balance  THERAPEUTIC ACTIVITIES: To improve functional performance.  Demonstration, verbal and tactile cues throughout for technique. Rolling s/s on mat table x 2 with vc for engaging transverse abdominals to initial roll Sit to and from supine on mat table with vc for bending knees to roll to side; using underside elbow to dig in and push up  12/17/23 THERAPEUTIC EXERCISE: To improve strength.  Demonstration, verbal and tactile cues throughout for technique. Nustep L5 x 8'  NEUROMUSCULAR RE-EDUCATION: To improve balance and coordination. Tandem stance x 1' BLE Tandem gait F/B X 3 LAPS at counter Stepping over foam rolls obstacle course x 5 laps F/B at counter Diagonal stepping w/ PVC cross x 10 going L; x 10 going R Step stance 8 x 1' BLE Step touch 8 x 10 BLE SLS w/ contralateral toe touch x 1' BLE and manual wt shifting to L SLS w/ only intermittent RUE counter support x 1' BLE with CGA for manual L wt shifting  THERAPEUTIC ACTIVITIES: To improve functional performance.  Demonstration, verbal and tactile cues throughout for technique. Counter back bends x 10 L pelvic sideglides x 10 at counter Seated hamstring stretch x 1' x 2 BLE  12/12/23 THERAPEUTIC EXERCISE: To improve strength.  Demonstration, verbal and tactile cues throughout for technique. UBE L2.5 x 5'  NEUROMUSCULAR RE-EDUCATION: To improve balance and coordination. Tandem stance x 1' BLE Tandem gait F/B X 3 LAPS at counter Diagonal stepping w/ PVC cross x 10 going L; x 10 going R Box stepping PVC cross x 5 laps CW; x 5 CCW Upside down  BOSU balance x 1'; heel/toe rocking x 1';  s/s rocking x 1' BOSU balance right side up x 2' Step touch 8 x 10 BLE  THERAPEUTIC  ACTIVITIES: To improve functional performance.  Demonstration, verbal and tactile cues throughout for technique. Counter back bends x 10 Supine thoracic extension x 20 Supine bridge x 20 POE x 3' POE with slight pressup w/ elbows x 5  12/05/23 THERAPEUTIC EXERCISE: To improve strength.  Demonstration, verbal and tactile cues throughout for technique. Nustep L4 x 6'  THERAPEUTIC ACTIVITIES: To improve functional performance.  Demonstration, verbal and tactile cues throughout for technique. Corner pec stretch at 90 x 30 sec x 3 Counter back ext x 10  Seated ham stretch x 1' x 3 BLE Prone lying x 3'  Prone press up on elbows x 10 Supine cervical and scapular retractionx 20 BUE  NEUROMUSCULAR RE-EDUCATION: To improve balance. Box stepping x 10 laps CW; x 10 CCW; diagonal steps at PVC cross x 5 L and x5 R Pool noodle step over and lunge x 2/8 BLE Toe/heel raises unsupported x 30  Education details: supine hip flexor stretching to HEP Person educated: Patient Education method: Medical illustrator Education comprehension: verbalized understanding, returned demonstration, and needs further education  HOME EXERCISE PROGRAM: Did not yet initiate new HEP. Prior code from last PT episode is below Access Code: [email or web address] URL: [email or web address] Date: 12/30/2023 Prepared by: Sol Gaskins  Exercises - Sit to Stand with Arms Crossed  - 1 x daily - 7 x weekly - 2 sets - 5 reps - Heel Raises with Counter Support  - 1 x daily - 7 x weekly - 1 sets - 10 reps - Alternating Step Taps with Counter Support  - 1 x daily - 7 x weekly - 1 sets - 10 reps - Forward Step Over with Counter Support  - 1 x daily - 7 x weekly - 1 sets - 10 reps - Push-Up on Counter  - 1 x daily - 7 x weekly - 1 sets - 10 reps - Standing Bent Over Single Arm Scapular Row  with Table Support with PLB  - 1 x daily - 7 x weekly - 1 sets - 10 reps - stomping  - 1 x daily - 7 x weekly - 1 sets - 10 reps - Forward Step Up with Counter Support  - 1 x daily - 7 x weekly - 1 sets - 10 reps - Seated Shoulder Row with Anchored Resistance  - 1 x daily - 7 x weekly - 2 sets - 10 reps - Standing Hip Abduction with Counter Support  - 1 x daily - 7 x weekly - 1 sets - 10 reps - Single Leg Stance with Support  - 1 x daily - 7 x weekly - 1 sets - 5 reps - Lunge with Counter Support  - 1 x daily - 7 x weekly - 1 sets - 5 reps - Side Step Overs with Cones and Counter Support  - 1 x daily - 7 x weekly - 3 sets - 10 reps - Standing Lumbar Extension with Counter  - 1 x daily - 7 x weekly - 3 sets - 10 reps - Doorway Pec Stretch at 90 Degrees Abduction  - 1 x daily - 7 x weekly - 3 sets - 10 reps - Seated Sidebending  - 1 x daily - 7 x weekly - 3 sets - 10 reps - Supine Bridge with Resistance Band  - 1 x daily - 7 x weekly - 3 sets - 10 reps - Hooklying Single Leg Bent Knee Fallouts with Resistance  - 1 x daily - 7 x weekly -  3 sets - 10 reps  GOALS: Goals reviewed with patient? Yes  SHORT TERM GOALS: Target date: 11/27/2023   Pt will be ind with initial HEP Baseline: Goal status: MET  2.  Pt will have improved TUG to </=13 sec Baseline: 15.03 Goal status: PROGRESSING- 14.13 sec (11/25/23)  3.  PT will obtain 6 min walk test baseline Baseline:  Goal status: MET- 7/15 25   LONG TERM GOALS: Target date: 12/25/2023   Pt will be ind with management and progression of HEP Baseline: 12/17/23 can teach back HEP today Goal status: MET  2.  Pt will have improved mini BESTest by 4 points to demo MCID Baseline: 14 12/19/23:  16 Goal status: IN PROGRESS  3.  Pt will demo at least 4/5 bilat LE strength for increased strength and stability Baseline:  Goal status: PROGRESSING; 11/27/23 hip abductors are improved from 3 to 3+ LLE and 3 to 4- on RLE 12/19/23: 4- LLE; 4 RLE  4.  Pt  will have improved ABC score to >/=67% to be better than cut-off score for balance impairments Baseline: 61.87% 12/19/23-71.3% Goal status: IN PROGRESS  5.  Pt will have improved 6 min walk test by at least 160' to demo MCID for increased endurance Baseline:  12/19/23 = 1500 FEET Goal status: MET; adequate for community ambulation in and out of stores with rollator with seat    ASSESSMENT:  CLINICAL IMPRESSION: Patient is not as fatigued today as previous session.  Participates well.  Continue focus on LLE stance stability with SLS and other activities.  RLE stance stability is very good compared to initial visit.  However, he still lacks stability on the LLE and has decreased wt shift to the L.   He is seeing this and working on it at home and is some better today although it would be difficult to quantify the improvement.  There is still room for more improvement which will translate into improved balance.   Continue per POC  Patient is a 88 y.o. M who was seen today for physical therapy evaluation and treatment for falls. PMH is significant for multiple falls, CAD with prior CABG, and increased kyphosis. Assessment demos general weakness, abnormal posture, decreased endurance and high fall risk with history of multiple falls. Pt will benefit from PT to address these deficits to maximize his level of safety.    OBJECTIVE IMPAIRMENTS: Abnormal gait, decreased activity tolerance, decreased balance, decreased endurance, decreased mobility, difficulty walking, decreased strength, improper body mechanics, and postural dysfunction.   ACTIVITY LIMITATIONS: standing, squatting, stairs, transfers, and locomotion level  PARTICIPATION LIMITATIONS: cleaning, shopping, and community activity  PERSONAL FACTORS: Age, Fitness, Past/current experiences, and Time since onset of injury/illness/exacerbation are also affecting patient's functional outcome.   REHAB POTENTIAL: Good  CLINICAL DECISION MAKING:  Evolving/moderate complexity  EVALUATION COMPLEXITY: Moderate  PLAN:  PT FREQUENCY: 2x/week  PT DURATION: 10 weeks  PLANNED INTERVENTIONS: [id number]- PT Re-evaluation, 97750- Physical Performance Testing, 97110-Therapeutic exercises, 97530- Therapeutic activity, 97112- Neuromuscular re-education, 97535- Self Care, [id number]- Manual therapy, [phone]- Gait training, [id number]- Aquatic Therapy, [phone]- Electrical stimulation (unattended), [id number]- Ultrasound, [id number]- Ionotophoresis 4mg /ml Dexamethasone , 79439 (1-2 muscles), 20561 (3+ muscles)- Dry Needling, Patient/Family education, Balance training, Stair training, Taping, Joint mobilization, Cryotherapy, and Moist heat  PLAN FOR NEXT SESSION: Continue with SLS stance on LLE; rocker board ramp activities  Sol LITTIE Gaskins, PTA 12/30/2023, 3:35 PM

## 2024-01-01 NOTE — Telephone Encounter (Signed)
 Refill request for warfarin:  Last INR was 1.7 on 12/27/23 Next INR due 01/17/24 LOV was 09/26/23  Refill approved.

## 2024-01-01 NOTE — Telephone Encounter (Signed)
 Pharmacy Change

## 2024-01-02 ENCOUNTER — Ambulatory Visit: Admitting: Rehabilitation

## 2024-01-02 ENCOUNTER — Encounter: Payer: Self-pay | Admitting: Rehabilitation

## 2024-01-02 DIAGNOSIS — R2681 Unsteadiness on feet: Secondary | ICD-10-CM

## 2024-01-02 DIAGNOSIS — M542 Cervicalgia: Secondary | ICD-10-CM

## 2024-01-02 DIAGNOSIS — M4123 Other idiopathic scoliosis, cervicothoracic region: Secondary | ICD-10-CM | POA: Diagnosis not present

## 2024-01-02 DIAGNOSIS — M6281 Muscle weakness (generalized): Secondary | ICD-10-CM

## 2024-01-02 DIAGNOSIS — R296 Repeated falls: Secondary | ICD-10-CM | POA: Diagnosis not present

## 2024-01-02 DIAGNOSIS — R293 Abnormal posture: Secondary | ICD-10-CM

## 2024-01-02 NOTE — Therapy (Signed)
 OUTPATIENT PHYSICAL THERAPY NEURO TREATMENT   Patient Name: BLAYDEN CONWELL MRN: [id number] DOB:1935/07/07, 88 y.o., male Today's Date: 01/02/2024   PCP: [name], NP REFERRING PROVIDER: [name], NP  END OF SESSION:  PT End of Session - 01/02/24 1457     Visit Number 14    Number of Visits 16    Date for PT Re-Evaluation 12/25/23    Authorization Type [employer]    Authorization Time Period Auth requested 10/30/23    Progress Note Due on Visit 10    PT Start Time 1453    PT Stop Time 1531    PT Time Calculation (min) 38 min    Activity Tolerance Patient tolerated treatment well    Behavior During Therapy WFL for tasks assessed/performed             Past Medical History:  Diagnosis Date   Acute gastric ulcer without mention of hemorrhage, perforation, or obstruction    Allergic rhinitis, cause unspecified    Anemia, unspecified    Arthritis    Asthma    Blood transfusion without reported diagnosis    CAD (coronary artery disease)    Diaphragmatic hernia without mention of obstruction or gangrene    Elevated prostate specific antigen (PSA)    Enlarged prostate    Esophageal reflux    Extrinsic asthma, unspecified    Herpes zoster without mention of complication    Hyperlipidemia    Hypersomnia with sleep apnea, unspecified    Impotence of organic origin    Kyphosis    Lumbago    Obstructive sleep apnea (adult) (pediatric)    Pneumonia    hx of years ago    Senile osteoporosis    Trigger finger (acquired)    Tubular adenoma of colon 05/2013   Unspecified essential hypertension    Unspecified sleep apnea    CPAP- settings 4-12    Unspecified vitamin D deficiency    Past Surgical History:  Procedure Laterality Date   CARDIAC CATHETERIZATION  03/04/2009   Dr Kerrin   COLONOSCOPY     CORONARY ARTERY BYPASS GRAFT  2010   CYSTOSCOPY WITH RETROGRADE PYELOGRAM, URETEROSCOPY AND STENT PLACEMENT Right 10/04/2013   Procedure:  CYSTOSCOPY WITH RETROGRADE PYELOGRAM,  AND STENT PLACEMENT;  Surgeon: Donnice Brooks, MD;  Location: [facility];  Service: Urology;  Laterality: Right;   DENTAL SURGERY N/A    MOLE REMOVAL  1958   chin   ROTATOR CUFF REPAIR Left 04/1998   with bone spur removed, Dr Shari   TONSILLECTOMY  1945   TRANSURETHRAL RESECTION OF PROSTATE N/A 09/24/2013   Procedure: TRANSURETHRAL RESECTION OF THE PROSTATE (TURP) WITH GYRUS (STAGED RIGHT LATERAL AND MEDIAN LOBE);  Surgeon: Arlena LILLETTE Gal, MD;  Location: [facility];  Service: Urology;  Laterality: N/A;   UPPER GI ENDOSCOPY     Patient Active Problem List   Diagnosis Date Noted   Vitamin D deficiency 09/19/2023   Recurrent falls 09/19/2023   CKD (chronic kidney disease) stage 3, GFR 30-59 ml/min (HCC) 07/06/2022   Atrial fibrillation (HCC) 10/25/2021   HFrEF (heart failure with reduced ejection fraction) (HCC) 01/19/2021   CAD (coronary artery disease) 10/11/2017   IDA (iron deficiency anemia) 08/31/2016   Seasonal and perennial allergic rhinitis 08/28/2016   Palpitations 09/07/2015   Cough, persistent 07/20/2015   Senile osteoporosis 06/09/2014   Hydronephrosis of right kidney 10/03/2013   Benign prostatic hyperplasia 09/24/2013   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   Dyslipidemia 04/06/2009  OSA (obstructive sleep apnea) 02/04/2009   Essential hypertension 09/02/2008   GERD (gastroesophageal reflux disease) 09/02/2008    ONSET DATE: Chronic  REFERRING DIAG: R29.6 (ICD-10-CM) - Recurrent falls  THERAPY DIAG:  Unsteadiness on feet  Abnormal posture  Frequent falls  Cervicalgia  Muscle weakness (generalized)  Rationale for Evaluation and Treatment: Rehabilitation  SUBJECTIVE:                                                                                                                                                                                             SUBJECTIVE STATEMENT: 7 minutes late to today's appointment.  He is agreeable  to PT.   He is aware of d/c next visit and states I'll probably come back in the fall....    EVAL Pt comes in due to falls. Has had history of falls for the last 3 years. Pt is on warfarin and goes to coumadin  clinic. Pt reports he normally falls forward. Has had PT in the past but has not been doing his exercises. Feels he has gotten weaker and not walking as far since stopping PT. Normally able to walk more around his neighborhood but currently only able to walk [place] 2-3 times. Recently got his rollator ~1 month ago Pt accompanied by: significant other, [name] (wife)  PERTINENT HISTORY: Wears thoracic/cervical brace at baseline. History of severe coronary artery disease with prior CABG. He has ischemic cardiomyopathy with a mildly reduced ejection fraction. He was diagnosed with atrial fibrillation in September 2022 and was started on amiodarone .   PAIN:  Are you having pain? No  PRECAUTIONS: Fall  RED FLAGS: None   WEIGHT BEARING RESTRICTIONS: No  FALLS: Has patient fallen in last 6 months? Yes. Number of falls at least 2 falls -- last fall on 08/23/23 slid off bed and hit head on his dresser  LIVING ENVIRONMENT: Lives with: lives with their spouse Lives in: House/apartment Stairs: 2 step ups to enter Has following equipment at home: Vannie - 4 wheeled  PLOF: Independent  PATIENT GOALS: Improve balance  OBJECTIVE:  Note: Objective measures were completed at Evaluation unless otherwise noted.  DIAGNOSTIC FINDINGS: CT cervical spine 08/23/23 IMPRESSION: 1. No acute traumatic injury identified in the cervical spine. 2. Chronic osteopenia, upper thoracic compression fractures with ankylosis and kyphosis.    COGNITION: Overall cognitive status: Within functional limits for tasks assessed   SENSATION: WFL  EDEMA:  None  MUSCLE TONE: Did not assess  DTRs:  Did not assess  POSTURE: Increased kyphosis. Wears neck and thoracic brace  LOWER EXTREMITY ROM:      Active  Right Eval Left Eval  Hip flexion    Hip extension    Hip abduction    Hip adduction    Hip internal rotation    Hip external rotation    Knee flexion    Knee extension    Ankle dorsiflexion    Ankle plantarflexion    Ankle inversion    Ankle eversion     (Blank rows = not tested)  LOWER EXTREMITY MMT:    MMT Right Eval Left Eval RLE LLE RLE 12/19/23 Lle 12/19/23  Hip flexion 5 4 5  4+    Hip extension 4- 3+      Hip abduction 3 3 3+ 4- 4- 4  Hip adduction        Hip internal rotation        Hip external rotation        Knee flexion 5 5 5 5     Knee extension 5 4- 5 5    Ankle dorsiflexion   4- 4-    Ankle plantarflexion        Ankle inversion        Ankle eversion        (Blank rows = not tested)  BED MOBILITY:  SBA for safety  TRANSFERS: Sit to stand: Modified independence  Assistive device utilized: Environmental consultant - 4 wheeled     Stand to sit: Modified independence  Assistive device utilized: hands to bed      RAMP:  Not tested  CURB:  Not tested  STAIRS: Not tested GAIT: Findings: Distance walked: 180' and Comments: Decreased bilat step length, forward head  FUNCTIONAL TESTS:  5 times sit to stand: 13.22 sec using back of legs against bed Mini-BESTest: 14     PATIENT SURVEYS:  ABC scale: The Activities-Specific Balance Confidence (ABC) Scale 0% 10 20 30  40 50 60 70 80 90 100% No confidence<->completely confident  "How confident are you that you will not lose your balance or become unsteady when you . . .   Date tested 10/30/23  Walk around the house 90%  2. Walk up or down stairs 90%  3. Bend over and pick up a slipper from in front of a closet floor 90%  4. Reach for a small can off a shelf at eye level 80%  5. Stand on tip toes and reach for something above your head 90%  6. Stand on a chair and reach for something 0%  7. Sweep the floor 100%  8. Walk outside the house to a car parked in the driveway 80%  9. Get into or out of a car  100%  10. Walk across a parking lot to the mall 100% w/ walker  11. Walk up or down a ramp 80% w/ walker  12. Walk in a crowded mall where people rapidly walk past you 80%  13. Are bumped into by people as you walk through the mall 10%  14. Step onto or off of an escalator while you are holding onto the railing 0%  15. Step onto or off an escalator while holding onto parcels such that you cannot hold onto the railing 0%  16. Walk outside on icy sidewalks 0%  Total: #/16 61.87%  TREATMENT DATE:  01/02/24 THERAPEUTIC EXERCISE: To improve flexibility.  Demonstration, verbal and tactile cues throughout for technique. UBE L2 x 6' backward  NEUROMUSCULAR RE-EDUCATION: To improve balance. 6 step ups x 10 leading L; x 10 leading R;  x 10 lateral to L Rocker board balance on incline x 2' Rocker balance on decline x 1'--easy Step touch to tandem position on step w/ RLE x 10 Step stance to tandem position on step w/ RLE x 1' Star cone touch RLE from 12-5:00 x 6 laps  THERAPEUTIC ACTIVITIES: To improve functional performance.  Demonstration, verbal and tactile cues throughout for technique. SL hip abd x 2/10 BLE Bridging x 3/10  TUG recheck:    12/30/23 THERAPEUTIC EXERCISE: To improve flexibility.  Demonstration, verbal and tactile cues throughout for technique. NuStep L5 x 6' Gait around clinic 3 laps 90 ft each with rollator  THERAPEUTIC ACTIVITIES: To improve functional performance.  Demonstration, verbal and tactile cues throughout for technique. Side lunges x 10 BLE- cues needed to correct form Fwd lunges x 10 BLE-  Lateral step ups 6' BLE x 20  Supine bridging w/ GTB to knees x 20  Supine bent knee fallout GTB 20x3  Education details: supine hip flexor stretching to HEP Person educated: Patient Education method: Medical illustrator Education  comprehension: verbalized understanding, returned demonstration, and needs further education  HOME EXERCISE PROGRAM: Did not yet initiate new HEP. Prior code from last PT episode is below Access Code: [email or web address] URL: [email or web address] Date: 12/30/2023 Prepared by: Sol Gaskins  Exercises - Sit to Stand with Arms Crossed  - 1 x daily - 7 x weekly - 2 sets - 5 reps - Heel Raises with Counter Support  - 1 x daily - 7 x weekly - 1 sets - 10 reps - Alternating Step Taps with Counter Support  - 1 x daily - 7 x weekly - 1 sets - 10 reps - Forward Step Over with Counter Support  - 1 x daily - 7 x weekly - 1 sets - 10 reps - Push-Up on Counter  - 1 x daily - 7 x weekly - 1 sets - 10 reps - Standing Bent Over Single Arm Scapular Row with Table Support with PLB  - 1 x daily - 7 x weekly - 1 sets - 10 reps - stomping  - 1 x daily - 7 x weekly - 1 sets - 10 reps - Forward Step Up with Counter Support  - 1 x daily - 7 x weekly - 1 sets - 10 reps - Seated Shoulder Row with Anchored Resistance  - 1 x daily - 7 x weekly - 2 sets - 10 reps - Standing Hip Abduction with Counter Support  - 1 x daily - 7 x weekly - 1 sets - 10 reps - Single Leg Stance with Support  - 1 x daily - 7 x weekly - 1 sets - 5 reps - Lunge with Counter Support  - 1 x daily - 7 x weekly - 1 sets - 5 reps - Side Step Overs with Cones and Counter Support  - 1 x daily - 7 x weekly - 3 sets - 10 reps - Standing Lumbar Extension with Counter  - 1 x daily - 7 x weekly - 3 sets - 10 reps - Doorway Pec Stretch at 90 Degrees Abduction  - 1 x daily - 7 x weekly - 3 sets - 10 reps - Seated Sidebending  - 1 x daily - 7  x weekly - 3 sets - 10 reps - Supine Bridge with Resistance Band  - 1 x daily - 7 x weekly - 3 sets - 10 reps - Hooklying Single Leg Bent Knee Fallouts with Resistance  - 1 x daily - 7 x weekly - 3 sets - 10 reps  GOALS: Goals reviewed with patient? Yes  SHORT TERM GOALS: Target date: 11/27/2023   Pt will be  ind with initial HEP Baseline: Goal status: MET  2.  Pt will have improved TUG to </=13 sec Baseline: 15.03 Goal status: PROGRESSING- 14.13 sec (11/25/23);  01/02/24:  13.43 sec  3.  PT will obtain 6 min walk test baseline Baseline:  Goal status: MET- 7/15 25   LONG TERM GOALS: Target date: 12/25/2023   Pt will be ind with management and progression of HEP Baseline: 12/17/23 can teach back HEP today Goal status: MET  2.  Pt will have improved mini BESTest by 4 points to demo MCID Baseline: 14 12/19/23:  16 Goal status: IN PROGRESS  3.  Pt will demo at least 4/5 bilat LE strength for increased strength and stability Baseline:  Goal status: PROGRESSING; 11/27/23 hip abductors are improved from 3 to 3+ LLE and 3 to 4- on RLE 12/19/23: 4- LLE; 4 RLE  4.  Pt will have improved ABC score to >/=67% to be better than cut-off score for balance impairments Baseline: 61.87% 12/19/23-71.3% 01/03/24:  73% Goal status:  MET  5.  Pt will have improved 6 min walk test by at least 160' to demo MCID for increased endurance Baseline:  12/19/23 = 1500 FEET Goal status: MET; adequate for community ambulation in and out of stores with rollator with seat    ASSESSMENT:  CLINICAL IMPRESSION: Patient has made good progress to goals.  HE is nearing his max rehab potential and I expect he will meet his goals on next visit.  Plan is for d/c and he is agreeable to this plan.    Patient is a 88 y.o. M who was seen today for physical therapy evaluation and treatment for falls. PMH is significant for multiple falls, CAD with prior CABG, and increased kyphosis. Assessment demos general weakness, abnormal posture, decreased endurance and high fall risk with history of multiple falls. Pt will benefit from PT to address these deficits to maximize his level of safety.    OBJECTIVE IMPAIRMENTS: Abnormal gait, decreased activity tolerance, decreased balance, decreased endurance, decreased mobility, difficulty  walking, decreased strength, improper body mechanics, and postural dysfunction.   ACTIVITY LIMITATIONS: standing, squatting, stairs, transfers, and locomotion level  PARTICIPATION LIMITATIONS: cleaning, shopping, and community activity  PERSONAL FACTORS: Age, Fitness, Past/current experiences, and Time since onset of injury/illness/exacerbation are also affecting patient's functional outcome.   REHAB POTENTIAL: Good  CLINICAL DECISION MAKING: Evolving/moderate complexity  EVALUATION COMPLEXITY: Moderate  PLAN:  PT FREQUENCY: 2x/week  PT DURATION: 10 weeks  PLANNED INTERVENTIONS: 97164- PT Re-evaluation, 97750- Physical Performance Testing, 97110-Therapeutic exercises, 97530- Therapeutic activity, [id number]- Neuromuscular re-education, 97535- Self Care, [id number]- Manual therapy, [id number]- Gait training, [phone]- Aquatic Therapy, [id number]- Electrical stimulation (unattended), [id number]- Ultrasound, [id number]- Ionotophoresis 4mg /ml Dexamethasone , 79439 (1-2 muscles), 20561 (3+ muscles)- Dry Needling, Patient/Family education, Balance training, Stair training, Taping, Joint mobilization, Cryotherapy, and Moist heat  PLAN FOR NEXT SESSION: Review HEP and D/C  Keyontay Stolz, PT 01/02/2024, 7:44 PM  Date of referral:  Referring provider: Harlene An, NP Referring diagnosis? Frequent falls Treatment diagnosis? (if different than referring diagnosis) unsteadiness on feet, cervicalgia, abnormal posture  What  was this (referring dx) caused by? Ongoing Issue  Lysle of Condition: Chronic (continuous duration > 3 months)   Laterality: Both  Current Functional Measure Score: Other ABC scale  baseline score was 61% and improved to 73%; evidencing the patient is more confident in his balance ability  Objective measurements identify impairments when they are compared to normal values, the uninvolved extremity, and prior level of function.  []  Yes  []  No  Objective assessment of functional ability:  Moderate functional limitations   Briefly describe symptoms: frequent falls at home  How did symptoms start: insidiously  Average pain intensity:  Last 24 hours: no pain  Past week: no pain  How often does the pt experience symptoms? Intermittently  How much have the symptoms interfered with usual daily activities? Moderately  How has condition changed since care began at this facility? Better TUG score has improved to 13.43 sec from 15 sec;  miniBest test for balance has improved from 14 to 16.   Patient continued to fall at home during the first month of our treatment, but has not had any falls at home x 1 month now  In general, how is the patients overall health? Good   BACK PAIN (STarT Back Screening Tool) No

## 2024-01-03 ENCOUNTER — Ambulatory Visit: Admitting: Podiatry

## 2024-01-03 ENCOUNTER — Encounter: Payer: Self-pay | Admitting: Podiatry

## 2024-01-03 DIAGNOSIS — M79674 Pain in right toe(s): Secondary | ICD-10-CM | POA: Diagnosis not present

## 2024-01-03 DIAGNOSIS — M79675 Pain in left toe(s): Secondary | ICD-10-CM | POA: Diagnosis not present

## 2024-01-03 DIAGNOSIS — B351 Tinea unguium: Secondary | ICD-10-CM

## 2024-01-03 DIAGNOSIS — Z7901 Long term (current) use of anticoagulants: Secondary | ICD-10-CM | POA: Diagnosis not present

## 2024-01-03 NOTE — Progress Notes (Signed)
  Subjective:  Patient ID: Brent Griffith, male    DOB: 1935-11-02,   MRN: [id number]  Chief Complaint  Patient presents with   Nail Problem    Nail trim     88 y.o. male presents for concern of thickened elongated and painful nails that are difficult to trim. Requesting to have them trimmed today. He is on coumadin  and at risk for foot care.   PCP:  [name], NP   .  SABRAHe is on coumadin .  Denies any other pedal complaints. Denies n/v/f/c.   Past Medical History:  Diagnosis Date   Acute gastric ulcer without mention of hemorrhage, perforation, or obstruction    Allergic rhinitis, cause unspecified    Anemia, unspecified    Arthritis    Asthma    Blood transfusion without reported diagnosis    CAD (coronary artery disease)    Diaphragmatic hernia without mention of obstruction or gangrene    Elevated prostate specific antigen (PSA)    Enlarged prostate    Esophageal reflux    Extrinsic asthma, unspecified    Herpes zoster without mention of complication    Hyperlipidemia    Hypersomnia with sleep apnea, unspecified    Impotence of organic origin    Kyphosis    Lumbago    Obstructive sleep apnea (adult) (pediatric)    Pneumonia    hx of years ago    Senile osteoporosis    Trigger finger (acquired)    Tubular adenoma of colon 05/2013   Unspecified essential hypertension    Unspecified sleep apnea    CPAP- settings 4-12    Unspecified vitamin D deficiency     Objective:  Physical Exam: Vascular: DP/PT pulses 2/4 bilateral. CFT <3 seconds. Normal hair growth on digits. No edema.  Skin. No lacerations or abrasions bilateral feet. Nails 1-5 bilateral are thickened elongated and dsytrophic with subungual debris. Musculoskeletal: MMT 5/5 bilateral lower extremities in DF, PF, Inversion and Eversion. Deceased ROM in DF of ankle joint.  Neurological: Sensation intact to light touch.   Assessment:   1. Pain due to onychomycosis of toenails of both feet   2. Chronic  anticoagulation       Plan:  Patient was evaluated and treated and all questions answered.  -Mechanically debrided all nails 1-5 bilateral using sterile nail nipper and filed with dremel without incident  -Answered all patient questions -Patient to return  in 3 months for at risk foot care -Patient advised to call the office if any problems or questions arise in the meantime.   Asberry Failing, DPM

## 2024-01-07 ENCOUNTER — Ambulatory Visit: Attending: Nurse Practitioner | Admitting: Rehabilitation

## 2024-01-07 DIAGNOSIS — R296 Repeated falls: Secondary | ICD-10-CM | POA: Diagnosis not present

## 2024-01-07 DIAGNOSIS — M4123 Other idiopathic scoliosis, cervicothoracic region: Secondary | ICD-10-CM | POA: Insufficient documentation

## 2024-01-07 DIAGNOSIS — R2681 Unsteadiness on feet: Secondary | ICD-10-CM | POA: Insufficient documentation

## 2024-01-07 DIAGNOSIS — M6281 Muscle weakness (generalized): Secondary | ICD-10-CM | POA: Diagnosis not present

## 2024-01-07 DIAGNOSIS — R293 Abnormal posture: Secondary | ICD-10-CM | POA: Insufficient documentation

## 2024-01-07 DIAGNOSIS — M542 Cervicalgia: Secondary | ICD-10-CM | POA: Diagnosis not present

## 2024-01-07 NOTE — Therapy (Signed)
 OUTPATIENT PHYSICAL THERAPY NEURO TREATMENT / DC SUMMARY   Patient Name: Brent Griffith MRN: [id number] DOB:December 11, 1935, 88 y.o., male Today's Date: 01/07/2024   PCP: [name], NP REFERRING PROVIDER: [name], NP  END OF SESSION:  PT End of Session - 01/07/24 1458     Visit Number 15    Number of Visits 16    Date for PT Re-Evaluation 12/25/23    Authorization Type [employer]    Authorization Time Period Auth requested 10/30/23    Progress Note Due on Visit 10    PT Start Time 1452    PT Stop Time 1533    PT Time Calculation (min) 41 min    Activity Tolerance Patient tolerated treatment well    Behavior During Therapy WFL for tasks assessed/performed             Past Medical History:  Diagnosis Date   Acute gastric ulcer without mention of hemorrhage, perforation, or obstruction    Allergic rhinitis, cause unspecified    Anemia, unspecified    Arthritis    Asthma    Blood transfusion without reported diagnosis    CAD (coronary artery disease)    Diaphragmatic hernia without mention of obstruction or gangrene    Elevated prostate specific antigen (PSA)    Enlarged prostate    Esophageal reflux    Extrinsic asthma, unspecified    Herpes zoster without mention of complication    Hyperlipidemia    Hypersomnia with sleep apnea, unspecified    Impotence of organic origin    Kyphosis    Lumbago    Obstructive sleep apnea (adult) (pediatric)    Pneumonia    hx of years ago    Senile osteoporosis    Trigger finger (acquired)    Tubular adenoma of colon 05/2013   Unspecified essential hypertension    Unspecified sleep apnea    CPAP- settings 4-12    Unspecified vitamin D deficiency    Past Surgical History:  Procedure Laterality Date   CARDIAC CATHETERIZATION  03/04/2009   Dr Kerrin   COLONOSCOPY     CORONARY ARTERY BYPASS GRAFT  2010   CYSTOSCOPY WITH RETROGRADE PYELOGRAM, URETEROSCOPY AND STENT PLACEMENT Right 10/04/2013    Procedure: CYSTOSCOPY WITH RETROGRADE PYELOGRAM,  AND STENT PLACEMENT;  Surgeon: Donnice Brooks, MD;  Location: [facility];  Service: Urology;  Laterality: Right;   DENTAL SURGERY N/A    MOLE REMOVAL  1958   chin   ROTATOR CUFF REPAIR Left 04/1998   with bone spur removed, Dr Shari   TONSILLECTOMY  1945   TRANSURETHRAL RESECTION OF PROSTATE N/A 09/24/2013   Procedure: TRANSURETHRAL RESECTION OF THE PROSTATE (TURP) WITH GYRUS (STAGED RIGHT LATERAL AND MEDIAN LOBE);  Surgeon: Arlena LILLETTE Gal, MD;  Location: [facility];  Service: Urology;  Laterality: N/A;   UPPER GI ENDOSCOPY     Patient Active Problem List   Diagnosis Date Noted   Vitamin D deficiency 09/19/2023   Recurrent falls 09/19/2023   CKD (chronic kidney disease) stage 3, GFR 30-59 ml/min (HCC) 07/06/2022   Atrial fibrillation (HCC) 10/25/2021   HFrEF (heart failure with reduced ejection fraction) (HCC) 01/19/2021   CAD (coronary artery disease) 10/11/2017   IDA (iron deficiency anemia) 08/31/2016   Seasonal and perennial allergic rhinitis 08/28/2016   Palpitations 09/07/2015   Cough, persistent 07/20/2015   Senile osteoporosis 06/09/2014   Hydronephrosis of right kidney 10/03/2013   Benign prostatic hyperplasia 09/24/2013   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  Dyslipidemia 04/06/2009   OSA (obstructive sleep apnea) 02/04/2009   Essential hypertension 09/02/2008   GERD (gastroesophageal reflux disease) 09/02/2008    ONSET DATE: Chronic  REFERRING DIAG: R29.6 (ICD-10-CM) - Recurrent falls  THERAPY DIAG:  Unsteadiness on feet  Abnormal posture  Frequent falls  Cervicalgia  Muscle weakness (generalized)  Other idiopathic scoliosis, cervicothoracic region  Rationale for Evaluation and Treatment: Rehabilitation  SUBJECTIVE:                                                                                                                                                                                             SUBJECTIVE  STATEMENT: 7 minutes late again for today's appointment.  States his wife made him late.    EVAL Pt comes in due to falls. Has had history of falls for the last 3 years. Pt is on warfarin and goes to coumadin  clinic. Pt reports he normally falls forward. Has had PT in the past but has not been doing his exercises. Feels he has gotten weaker and not walking as far since stopping PT. Normally able to walk more around his neighborhood but currently only able to walk [place] 2-3 times. Recently got his rollator ~1 month ago Pt accompanied by: significant other, Brent Griffith (wife)  PERTINENT HISTORY: Wears thoracic/cervical brace at baseline. History of severe coronary artery disease with prior CABG. He has ischemic cardiomyopathy with a mildly reduced ejection fraction. He was diagnosed with atrial fibrillation in September 2022 and was started on amiodarone .   PAIN:  Are you having pain? No  PRECAUTIONS: Fall  RED FLAGS: None   WEIGHT BEARING RESTRICTIONS: No  FALLS: Has patient fallen in last 6 months? Yes. Number of falls at least 2 falls -- last fall on 08/23/23 slid off bed and hit head on his dresser  LIVING ENVIRONMENT: Lives with: lives with their spouse Lives in: House/apartment Stairs: 2 step ups to enter Has following equipment at home: Vannie - 4 wheeled  PLOF: Independent  PATIENT GOALS: Improve balance  OBJECTIVE:  Note: Objective measures were completed at Evaluation unless otherwise noted.  DIAGNOSTIC FINDINGS: CT cervical spine 08/23/23 IMPRESSION: 1. No acute traumatic injury identified in the cervical spine. 2. Chronic osteopenia, upper thoracic compression fractures with ankylosis and kyphosis.    COGNITION: Overall cognitive status: Within functional limits for tasks assessed   SENSATION: WFL  EDEMA:  None  MUSCLE TONE: Did not assess  DTRs:  Did not assess  POSTURE: Increased kyphosis. Wears neck and thoracic brace  LOWER EXTREMITY ROM:      Active  Right Eval Left Eval  Hip  flexion    Hip extension    Hip abduction    Hip adduction    Hip internal rotation    Hip external rotation    Knee flexion    Knee extension    Ankle dorsiflexion    Ankle plantarflexion    Ankle inversion    Ankle eversion     (Blank rows = not tested)  LOWER EXTREMITY MMT:    MMT Right Eval Left Eval RLE LLE RLE 12/19/23 Lle 12/19/23  Hip flexion 5 4 5  4+    Hip extension 4- 3+      Hip abduction 3 3 3+ 4- 4- 4  Hip adduction        Hip internal rotation        Hip external rotation        Knee flexion 5 5 5 5     Knee extension 5 4- 5 5    Ankle dorsiflexion   4- 4-    Ankle plantarflexion        Ankle inversion        Ankle eversion        (Blank rows = not tested)  BED MOBILITY:  SBA for safety  TRANSFERS: Sit to stand: Modified independence  Assistive device utilized: Environmental consultant - 4 wheeled     Stand to sit: Modified independence  Assistive device utilized: hands to bed      RAMP:  Not tested  CURB:  Not tested  STAIRS: Not tested GAIT: Findings: Distance walked: 180' and Comments: Decreased bilat step length, forward head  FUNCTIONAL TESTS:  5 times sit to stand: 13.22 sec using back of legs against bed Mini-BESTest: 14  [facility] PT Assessment - 01/07/24 0001       Mini-BESTest   Sit To Stand Normal: Comes to stand without use of hands and stabilizes independently.    Rise to Toes Moderate: Heels up, but not full range (smaller than when holding hands), OR noticeable instability for 3 s.    Stand on one leg (left) Severe: Unable    Stand on one leg (right) Severe: Unable    Stand on one leg - lowest score 0    Compensatory Stepping Correction - Forward No step, OR would fall if not caught, OR falls spontaneously.    Compensatory Stepping Correction - Backward No step, OR would fall if not caught, OR falls spontaneously.    Compensatory Stepping Correction - Left Lateral Severe: Falls, or cannot step     Compensatory Stepping Correction - Right Lateral Severe:  Falls, or cannot step    Stepping Corredtion Lateral - lowest score 0    Stance - Feet together, eyes open, firm surface  Normal: 30s    Stance - Feet together, eyes closed, foam surface  Normal: 30s    Incline - Eyes Closed Severe: Unable    Change in Gait Speed Normal: Significantly changes walkling speed without imbalance    Walk with head turns - Horizontal Normal: performs head turns with no change in gait speed and good balance    Walk with pivot turns Normal: Turns with feet close FAST (< 3 steps) with good balance.    Step over obstacles Moderate: Steps over box but touches box OR displays cautious behavior by slowing gait.    Timed UP & GO with Dual Task Normal: No noticeable change in sitting, standing or walking while backward counting when compared to TUG without    Mini-BEST total score 16  PATIENT SURVEYS:  ABC scale: The Activities-Specific Balance Confidence (ABC) Scale 0% 10 20 30  40 50 60 70 80 90 100% No confidence<->completely confident  "How confident are you that you will not lose your balance or become unsteady when you . . .   Date tested 10/30/23  Walk around the house 90%  2. Walk up or down stairs 90%  3. Bend over and pick up a slipper from in front of a closet floor 90%  4. Reach for a small can off a shelf at eye level 80%  5. Stand on tip toes and reach for something above your head 90%  6. Stand on a chair and reach for something 0%  7. Sweep the floor 100%  8. Walk outside the house to a car parked in the driveway 80%  9. Get into or out of a car 100%  10. Walk across a parking lot to the mall 100% w/ walker  11. Walk up or down a ramp 80% w/ walker  12. Walk in a crowded mall where people rapidly walk past you 80%  13. Are bumped into by people as you walk through the mall 10%  14. Step onto or off of an escalator while you are holding onto the railing 0%  15. Step onto or off  an escalator while holding onto parcels such that you cannot hold onto the railing 0%  16. Walk outside on icy sidewalks 0%  Total: #/16 61.87%                                                                                                                                 TREATMENT DATE:  01/07/24: THERAPEUTIC EXERCISE: To improve flexibility.  Demonstration, verbal and tactile cues throughout for technique. NuStep L5 x 6'  THERAPEUTIC ACTIVITIES: To improve functional performance.  Demonstration, verbal and tactile cues throughout for technique. Up/down 1 flight of steps with R handrail reciprocal pattern with intermittent cueing for slowing speed and focusing on next step  PHYSICAL PERFORMANCE TEST or MEASUREMENT:  TUG rechecked   [facility] PT Assessment - 01/07/24 0001       Mini-BESTest   Sit To Stand Normal: Comes to stand without use of hands and stabilizes independently.    Rise to Toes Moderate: Heels up, but not full range (smaller than when holding hands), OR noticeable instability for 3 s.    Stand on one leg (left) Severe: Unable    Stand on one leg (right) Severe: Unable    Stand on one leg - lowest score 0    Compensatory Stepping Correction - Forward No step, OR would fall if not caught, OR falls spontaneously.    Compensatory Stepping Correction - Backward No step, OR would fall if not caught, OR falls spontaneously.    Compensatory Stepping Correction - Left Lateral Severe: Falls, or cannot step    Compensatory Stepping Correction - Right Lateral Severe:  Falls, or  cannot step    Stepping Corredtion Lateral - lowest score 0    Stance - Feet together, eyes open, firm surface  Normal: 30s    Stance - Feet together, eyes closed, foam surface  Normal: 30s    Incline - Eyes Closed Severe: Unable    Change in Gait Speed Normal: Significantly changes walkling speed without imbalance    Walk with head turns - Horizontal Normal: performs head turns with no change in gait speed and  good balance    Walk with pivot turns Normal: Turns with feet close FAST (< 3 steps) with good balance.    Step over obstacles Moderate: Steps over box but touches box OR displays cautious behavior by slowing gait.    Timed UP & GO with Dual Task Normal: No noticeable change in sitting, standing or walking while backward counting when compared to TUG without    Mini-BEST total score 16           01/02/24 THERAPEUTIC EXERCISE: To improve flexibility.  Demonstration, verbal and tactile cues throughout for technique. UBE L2 x 6' backward  NEUROMUSCULAR RE-EDUCATION: To improve balance. 6 step ups x 10 leading L; x 10 leading R;  x 10 lateral to L Rocker board balance on incline x 2' Rocker balance on decline x 1'--easy Step touch to tandem position on step w/ RLE x 10 Step stance to tandem position on step w/ RLE x 1' Star cone touch RLE from 12-5:00 x 6 laps  THERAPEUTIC ACTIVITIES: To improve functional performance.  Demonstration, verbal and tactile cues throughout for technique. SL hip abd x 2/10 BLE Bridging x 3/10  TUG recheck:    12/30/23 THERAPEUTIC EXERCISE: To improve flexibility.  Demonstration, verbal and tactile cues throughout for technique. NuStep L5 x 6' Gait around clinic 3 laps 90 ft each with rollator  THERAPEUTIC ACTIVITIES: To improve functional performance.  Demonstration, verbal and tactile cues throughout for technique. Side lunges x 10 BLE- cues needed to correct form Fwd lunges x 10 BLE-  Lateral step ups 6' BLE x 20  Supine bridging w/ GTB to knees x 20  Supine bent knee fallout GTB 20x3  Education details: supine hip flexor stretching to HEP Person educated: Patient Education method: Medical illustrator Education comprehension: verbalized understanding, returned demonstration, and needs further education  HOME EXERCISE PROGRAM: Did not yet initiate new HEP. Prior code from last PT episode is below Access Code: [email or web address] URL:  [email or web address] Date: 12/30/2023 Prepared by: Sol Gaskins  Exercises - Sit to Stand with Arms Crossed  - 1 x daily - 7 x weekly - 2 sets - 5 reps - Heel Raises with Counter Support  - 1 x daily - 7 x weekly - 1 sets - 10 reps - Alternating Step Taps with Counter Support  - 1 x daily - 7 x weekly - 1 sets - 10 reps - Forward Step Over with Counter Support  - 1 x daily - 7 x weekly - 1 sets - 10 reps - Push-Up on Counter  - 1 x daily - 7 x weekly - 1 sets - 10 reps - Standing Bent Over Single Arm Scapular Row with Table Support with PLB  - 1 x daily - 7 x weekly - 1 sets - 10 reps - stomping  - 1 x daily - 7 x weekly - 1 sets - 10 reps - Forward Step Up with Counter Support  - 1 x daily - 7 x weekly - 1 sets -  10 reps - Seated Shoulder Row with Anchored Resistance  - 1 x daily - 7 x weekly - 2 sets - 10 reps - Standing Hip Abduction with Counter Support  - 1 x daily - 7 x weekly - 1 sets - 10 reps - Single Leg Stance with Support  - 1 x daily - 7 x weekly - 1 sets - 5 reps - Lunge with Counter Support  - 1 x daily - 7 x weekly - 1 sets - 5 reps - Side Step Overs with Cones and Counter Support  - 1 x daily - 7 x weekly - 3 sets - 10 reps - Standing Lumbar Extension with Counter  - 1 x daily - 7 x weekly - 3 sets - 10 reps - Doorway Pec Stretch at 90 Degrees Abduction  - 1 x daily - 7 x weekly - 3 sets - 10 reps - Seated Sidebending  - 1 x daily - 7 x weekly - 3 sets - 10 reps - Supine Bridge with Resistance Band  - 1 x daily - 7 x weekly - 3 sets - 10 reps - Hooklying Single Leg Bent Knee Fallouts with Resistance  - 1 x daily - 7 x weekly - 3 sets - 10 reps  GOALS: Goals reviewed with patient? Yes  SHORT TERM GOALS: Target date: 11/27/2023   Pt will be ind with initial HEP Baseline: Goal status: MET  2.  Pt will have improved TUG to </=13 sec Baseline: 15.03 Goal status: PARTIAL MET- 14.13 sec (11/25/23);  01/02/24:  13.43 sec;  01/07/24  13.10 sec  3.  PT will  obtain 6 min walk test baseline Baseline:  Goal status: MET- 7/15 25   LONG TERM GOALS: Target date: 12/25/2023   Pt will be ind with management and progression of HEP Baseline: 12/17/23 can teach back HEP today Goal status: MET  2.  Pt will have improved mini BESTest by 4 points to demo MCID Baseline: 14 12/19/23:  16 01/07/24:  16 Goal status: PARTIAL MET  3.  Pt will demo at least 4/5 bilat LE strength for increased strength and stability Baseline:  Goal status: PROGRESSING; 11/27/23 hip abductors are improved from 3 to 3+ LLE and 3 to 4- on RLE 12/19/23: 4- LLE; 4 RLE  4.  Pt will have improved ABC score to >/=67% to be better than cut-off score for balance impairments Baseline: 61.87% 12/19/23-71.3% 01/03/24:  73% Goal status:  MET  5.  Pt will have improved 6 min walk test by at least 160' to demo MCID for increased endurance Baseline:  12/19/23 = 1500 FEET Goal status: MET; adequate for community ambulation in and out of stores with rollator with seat    ASSESSMENT:  CLINICAL IMPRESSION: Patient has been seen x 2 months PT for unsteady gait and falls.  He has made as much progress as he can at this time and is at his maximum rehab potential.  He is ambulating independently with his rollator functional community distances.  He can ambulate safely in his home with no device, but is advised to continue using his walker at all times when he is out of the home.  Hopefully he will be compliant with this advice.  However he admits he walks his dog using a cane which is not advisable.   Noncompliance with appropriate device keeps him at a high fall risk.  He is independent with his HEP and advised to continue with it daily as tolerated  and call us  with any further questions.  He verbalizes understanding and agreement.  D/C PT.  Patient is a 88 y.o. M who was seen today for physical therapy evaluation and treatment for falls. PMH is significant for multiple falls, CAD with prior CABG,  and increased kyphosis. Assessment demos general weakness, abnormal posture, decreased endurance and high fall risk with history of multiple falls. Pt will benefit from PT to address these deficits to maximize his level of safety.    OBJECTIVE IMPAIRMENTS: Abnormal gait, decreased activity tolerance, decreased balance, decreased endurance, decreased mobility, difficulty walking, decreased strength, improper body mechanics, and postural dysfunction.   ACTIVITY LIMITATIONS: standing, squatting, stairs, transfers, and locomotion level  PARTICIPATION LIMITATIONS: cleaning, shopping, and community activity  PERSONAL FACTORS: Age, Fitness, Past/current experiences, and Time since onset of injury/illness/exacerbation are also affecting patient's functional outcome.   REHAB POTENTIAL: Good  CLINICAL DECISION MAKING: Evolving/moderate complexity  EVALUATION COMPLEXITY: Moderate  PLAN:  PT FREQUENCY: 2x/week  PT DURATION: 10 weeks  PLANNED INTERVENTIONS: 97164- PT Re-evaluation, 97750- Physical Performance Testing, 97110-Therapeutic exercises, 97530- Therapeutic activity, [id number]- Neuromuscular re-education, 97535- Self Care, [id number]- Manual therapy, [id number]- Gait training, [id number]- Aquatic Therapy, [phone]- Electrical stimulation (unattended), [id number]- Ultrasound, [id number]- Ionotophoresis 4mg /ml Dexamethasone , 79439 (1-2 muscles), 20561 (3+ muscles)- Dry Needling, Patient/Family education, Balance training, Stair training, Taping, Joint mobilization, Cryotherapy, and Moist heat  PLAN FOR NEXT SESSION: Review HEP and D/C  Reata Petrov, PT 01/07/2024, 8:47 PM  Date of referral:  Referring provider: Harlene An, NP Referring diagnosis? Frequent falls Treatment diagnosis? (if different than referring diagnosis) unsteadiness on feet, cervicalgia, abnormal posture  What was this (referring dx) caused by? Ongoing Issue  Lysle of Condition: Chronic (continuous duration > 3 months)   Laterality:  Both  Current Functional Measure Score: Other ABC scale  baseline score was 61% and improved to 73%; evidencing the patient is more confident in his balance ability  Objective measurements identify impairments when they are compared to normal values, the uninvolved extremity, and prior level of function.  []  Yes  []  No  Objective assessment of functional ability: Moderate functional limitations   Briefly describe symptoms: frequent falls at home  How did symptoms start: insidiously  Average pain intensity:  Last 24 hours: no pain  Past week: no pain  How often does the pt experience symptoms? Intermittently  How much have the symptoms interfered with usual daily activities? Moderately  How has condition changed since care began at this facility? Better TUG score has improved to 13.43 sec from 15 sec;  miniBest test for balance has improved from 14 to 16.   Patient continued to fall at home during the first month of our treatment, but has not had any falls at home x 1 month now  In general, how is the patients overall health? Good   BACK PAIN (STarT Back Screening Tool) No   PHYSICAL THERAPY DISCHARGE SUMMARY  Visits from Start of Care: 15  Current functional level related to goals / functional outcomes: ABC score is 73%; TUG is 13.10 sec, miniBEST test score is 16; Strength is 4- to 4 in B hip abductors; 6 min walk test is 1500 feet   Remaining deficits: He still has residual balance deficits, and we recommend he use a walker out of the home.   Indoors he can use no device so long as he is not carrying items   Education / Equipment: Independent with HEP    Patient agrees to discharge. Patient goals were met. Patient  is being discharged due to maximized rehab potential.

## 2024-01-12 ENCOUNTER — Other Ambulatory Visit: Payer: Self-pay | Admitting: Cardiology

## 2024-01-14 ENCOUNTER — Other Ambulatory Visit: Payer: Self-pay | Admitting: Cardiology

## 2024-01-14 NOTE — Telephone Encounter (Signed)
 Pt of Dr. Cindie. Does Dr. Cindie want to refill this RX? Please advise.

## 2024-01-16 ENCOUNTER — Other Ambulatory Visit: Payer: Self-pay

## 2024-01-16 ENCOUNTER — Ambulatory Visit: Attending: Cardiology

## 2024-01-16 DIAGNOSIS — Z5181 Encounter for therapeutic drug level monitoring: Secondary | ICD-10-CM | POA: Diagnosis not present

## 2024-01-16 DIAGNOSIS — I4891 Unspecified atrial fibrillation: Secondary | ICD-10-CM

## 2024-01-16 LAB — POCT INR: INR: 1.8 — AB (ref 2.0–3.0)

## 2024-01-16 MED ORDER — SPIKEVAX 50 MCG/0.5ML IM SUSP: 0.5000 mL | INTRAMUSCULAR | 1 refills | Status: AC

## 2024-01-16 NOTE — Progress Notes (Signed)
 INR 1.8 Please see anticoagulation encounter Take another 1 tablet today only then continue taking 1/2 tablet daily, EXCEPT 1 tablet on Fridays.  Stay consistent with boost/ensure (Mon, Wed, and Fri)  Recheck INR in 4 weeks. Coumadin  Clinic [phone]

## 2024-01-16 NOTE — Patient Instructions (Signed)
 Take another 1 tablet today only then continue taking 1/2 tablet daily, EXCEPT 1 tablet on Fridays.  Stay consistent with boost/ensure (Mon, Wed, and Fri)  Recheck INR in 4 weeks. Coumadin  Clinic [phone]

## 2024-01-20 ENCOUNTER — Other Ambulatory Visit: Payer: Self-pay

## 2024-01-20 DIAGNOSIS — I25810 Atherosclerosis of coronary artery bypass graft(s) without angina pectoris: Secondary | ICD-10-CM

## 2024-01-20 DIAGNOSIS — G4733 Obstructive sleep apnea (adult) (pediatric): Secondary | ICD-10-CM

## 2024-01-20 DIAGNOSIS — I10 Essential (primary) hypertension: Secondary | ICD-10-CM

## 2024-01-20 DIAGNOSIS — I4891 Unspecified atrial fibrillation: Secondary | ICD-10-CM

## 2024-01-20 NOTE — Patient Instructions (Signed)
 Visit Information  Thank you for taking time to visit with me today. Please don't hesitate to contact me if I can be of assistance to you before our next scheduled appointment.  Your next care management appointment is by telephone on Monday, October 6 at 11:30 AM  Please call the care guide team at [phone] if you need to cancel, schedule, or reschedule an appointment.   Please call [phone]-TALK (toll free, 24 hour hotline) if you are experiencing a Mental Health or Behavioral Health Crisis or need someone to talk to.  Clayborne Ly RN BSN CCM [place]  [facility], [facility] Nurse Care Coordinator  Direct Dial: [phone] Website: [email or web address]

## 2024-01-20 NOTE — Patient Outreach (Signed)
 Complex Care Management   Visit Note  01/20/2024  Name:  Brent Griffith MRN: [id number] DOB: 1935/12/29  Situation: Referral received for Complex Care Management related to Atrial Fibrillation, CAD, Hypertension. I obtained verbal consent from Patient.  Visit completed with Patient on the phone.  Background:   Past Medical History:  Diagnosis Date   Acute gastric ulcer without mention of hemorrhage, perforation, or obstruction    Allergic rhinitis, cause unspecified    Anemia, unspecified    Arthritis    Asthma    Blood transfusion without reported diagnosis    CAD (coronary artery disease)    Diaphragmatic hernia without mention of obstruction or gangrene    Elevated prostate specific antigen (PSA)    Enlarged prostate    Esophageal reflux    Extrinsic asthma, unspecified    Herpes zoster without mention of complication    Hyperlipidemia    Hypersomnia with sleep apnea, unspecified    Impotence of organic origin    Kyphosis    Lumbago    Obstructive sleep apnea (adult) (pediatric)    Pneumonia    hx of years ago    Senile osteoporosis    Trigger finger (acquired)    Tubular adenoma of colon 05/2013   Unspecified essential hypertension    Unspecified sleep apnea    CPAP- settings 4-12    Unspecified vitamin D deficiency     Assessment: Patient Reported Symptoms:  Cognitive Cognitive Status: Alert and oriented to person, place, and time, Normal speech and language skills Cognitive/Intellectual Conditions Management [RPT]: None reported or documented in medical history or problem list   Health Maintenance Behaviors: Annual physical exam, Healthy diet Healing Pattern: Average Health Facilitated by: Healthy diet, Rest  Neurological Neurological Review of Symptoms: No symptoms reported    HEENT HEENT Symptoms Reported: No symptoms reported      Cardiovascular Cardiovascular Symptoms Reported: No symptoms reported Does patient have uncontrolled Hypertension?:  No Cardiovascular Management Strategies: Medication therapy, Routine screening Cardiovascular Self-Management Outcome: 4 (good)  Respiratory Respiratory Symptoms Reported: No symptoms reported Other Respiratory Symptoms: Patient states he plans to call Dr. Neysa for a follow up Respiratory Management Strategies: Medication therapy, Routine screening, CPAP Respiratory Self-Management Outcome: 4 (good)  Endocrine Endocrine Symptoms Reported: No symptoms reported    Gastrointestinal Gastrointestinal Symptoms Reported: No symptoms reported Gastrointestinal Management Strategies: Medication therapy Gastrointestinal Self-Management Outcome: 4 (good)    Genitourinary Genitourinary Symptoms Reported: No symptoms reported    Integumentary Integumentary Symptoms Reported: No symptoms reported    Musculoskeletal Musculoskelatal Symptoms Reviewed: Difficulty walking, Unsteady gait, Limited mobility Musculoskeletal Management Strategies: Medical device, Routine screening, Adequate rest Musculoskeletal Self-Management Outcome: 4 (good) Falls in the past year?: Yes Number of falls in past year: 2 or more Was there an injury with Fall?: Yes Fall Risk Category Calculator: 3 Patient Fall Risk Level: High Fall Risk Patient at Risk for Falls Due to: History of fall(s), Impaired balance/gait, Impaired mobility Fall risk Follow up: Falls evaluation completed, Education provided, Falls prevention discussed  Psychosocial Psychosocial Symptoms Reported: No symptoms reported   Major Change/Loss/Stressor/Fears (CP): Denies Quality of Family Relationships: involved, supportive, helpful Do you feel physically threatened by others?: No    01/20/2024    PHQ2-9 Depression Screening   Jasiel Belisle interest or pleasure in doing things    Feeling down, depressed, or hopeless    PHQ-2 - Total Score    Trouble falling or staying asleep, or sleeping too much    Feeling tired or having Kveon Casanas energy  Poor appetite or  overeating     Feeling bad about yourself - or that you are a failure or have let yourself or your family down    Trouble concentrating on things, such as reading the newspaper or watching television    Moving or speaking so slowly that other people could have noticed.  Or the opposite - being so fidgety or restless that you have been moving around a lot more than usual    Thoughts that you would be better off dead, or hurting yourself in some way    PHQ2-9 Total Score    If you checked off any problems, how difficult have these problems made it for you to do your work, take care of things at home, or get along with other people    Depression Interventions/Treatment      There were no vitals filed for this visit.  Medications Reviewed Today     Reviewed by Morgan Clayborne CROME, RN (Registered Nurse) on 01/20/24 at 1342  Med List Status: <None>   Medication Order Taking? Sig Documenting Provider Last Dose Status Informant  albuterol  (VENTOLIN  HFA) 108 (90 Base) MCG/ACT inhaler [id number] Yes Inhale 1-2 puffs into the lungs every 6 (six) hours as needed for wheezing or shortness of breath. Fargo, Amy E, NP  Active   alum & mag hydroxide-simeth (MAALOX MAX) 400-400-40 MG/5ML suspension [id number] Yes Take 10 mLs by mouth every 6 (six) hours as needed (pain with swallowing and reflux). Ethyl Richerd BROCKS, MD  Active   amiodarone  (PACERONE ) 200 MG tablet [id number] Yes TAKE 1 TABLET BY MOUTH DAILY [name], MD  Active   Ascorbic Acid (VITAMIN C) 1000 MG tablet [id number]  Take 1,000 mg by mouth daily.  Patient not taking: Reported on 01/20/2024   [provider]  Active   Calcium  Citrate-Vitamin D (EQ CALCIUM  CITRATE+D3 PO) [id number] Yes Take 1,200 mg by mouth daily. [provider]  Active Self           Med Note MARTHA DEVIN BROCKS Pablo May 27, 2023  2:23 PM)    carvedilol  (COREG ) 3.125 MG tablet [id number] Yes TAKE 1 TABLET BY MOUTH TWICE  DAILY [name], MD   Active   Coenzyme Q10 (COQ10) 200 MG CAPS [id number] Yes Take 200 mg by mouth daily. [provider]  Active Self  COVID-19 mRNA vaccine, Moderna, >/= 6yrs, (SPIKEVAX ) injection [id number]  Inject 0.5 mLs into the muscle as directed. [name], NP  Active   doxazosin  (CARDURA ) 2 MG tablet [id number] Yes TAKE 1 TABLET BY MOUTH DAILY [name], MD  Active   famotidine  (PEPCID ) 20 MG tablet [id number] Yes TAKE 1 TABLET BY MOUTH TWICE  DAILY McMichael, Bayley M, PA-C  Active   finasteride  (PROSCAR ) 5 MG tablet [id number] Yes TAKE 1 TABLET BY MOUTH IN  THE MORNING Eubanks, Jessica K, NP  Active   fluticasone  (FLONASE ) 50 MCG/ACT nasal spray [id number]  Place 1 spray into both nostrils daily.  Patient not taking: Reported on 01/20/2024   Griselda Norris, MD  Active   furosemide  (LASIX ) 20 MG tablet [id number]  TAKE 1 TABLET BY MOUTH DAILY AS  NEEDED FOR LEG SWELLING  Patient not taking: Reported on 01/20/2024   [name], MD  Active   Iron, Ferrous Sulfate, 325 (65 Fe) MG TABS [id number] Yes Take 325 mg by mouth 2 (two) times daily. [name], NP  Active   JARDIANCE  10 MG TABS tablet  [id number] Yes TAKE 1 TABLET BY MOUTH DAILY BEFORE OFILIA Pietro Redell GORMAN, MD  Active   lactose free nutrition (BOOST) LIQD [id number] Yes Take 237 mLs by mouth every Monday, Wednesday, and Friday. [provider]  Active   losartan  (COZAAR ) 25 MG tablet [id number] Yes TAKE 1 TABLET BY MOUTH DAILY  PLEASE KEEP SCHEDULED  APPOINTMENT WITH CARDIOLOGIST Pietro Redell GORMAN, MD  Active   mexiletine (MEXITIL) 150 MG capsule [id number] Yes TAKE 1 CAPSULE BY MOUTH 2 TIMES A DAY Pietro Redell GORMAN, MD  Active   pantoprazole  (PROTONIX ) 40 MG tablet [id number] Yes TAKE 1 TABLET BY MOUTH DAILY Eubanks, Jessica K, NP  Active   rosuvastatin  (CRESTOR ) 20 MG tablet [id number] Yes TAKE 1 TABLET BY MOUTH ONCE  DAILY FOR CHOLESTEROL Eubanks, Jessica K, NP  Active   warfarin (COUMADIN ) 2.5 MG tablet  [id number] Yes TAKE 1/2 TO 1 TABLET BY MOUTH  DAILY AS DIRECTED BY THE  COUMADIN  CLINIC Pietro Redell GORMAN, MD  Active   Med List Note Vernel Reggy BIRCH, MD 07/15/14 2050): CPAP Advanced AutoPap 6-12            Recommendation:   PCP Follow-up Continue Current Plan of Care  Follow Up Plan:   Telephone follow up appointment date/time:  Monday, October 6 at 11:30 AM  Clayborne Ly RN BSN CCM [place]  [facility], [facility] Nurse Care Coordinator  Direct Dial: [phone] Website: [email or web address]

## 2024-01-23 ENCOUNTER — Telehealth: Payer: Self-pay | Admitting: Pharmacist

## 2024-01-23 DIAGNOSIS — I5020 Unspecified systolic (congestive) heart failure: Secondary | ICD-10-CM

## 2024-01-23 NOTE — Progress Notes (Signed)
   01/23/2024  Patient ID: Brent Griffith, male   DOB: 19-Dec-1935, 88 y.o.   MRN: [id number]  Called Patient regarding medication assistance and adherence per referral. Unfortunately, he did not answer the phone. HIPAA compliant message was left on his voicemail.  Plan: Call Patient back in 1-2 business days.   Cassius DOROTHA Brought, PharmD, BCACP Clinical Pharmacist [phone]

## 2024-01-24 ENCOUNTER — Emergency Department (HOSPITAL_BASED_OUTPATIENT_CLINIC_OR_DEPARTMENT_OTHER)
Admission: EM | Admit: 2024-01-24 | Discharge: 2024-01-24 | Disposition: A | Discharge status: Home / Self Care | Attending: Emergency Medicine | Admitting: Emergency Medicine

## 2024-01-24 ENCOUNTER — Other Ambulatory Visit: Payer: Self-pay

## 2024-01-24 ENCOUNTER — Emergency Department (HOSPITAL_BASED_OUTPATIENT_CLINIC_OR_DEPARTMENT_OTHER)

## 2024-01-24 ENCOUNTER — Encounter (HOSPITAL_BASED_OUTPATIENT_CLINIC_OR_DEPARTMENT_OTHER): Payer: Self-pay | Admitting: Emergency Medicine

## 2024-01-24 DIAGNOSIS — W010XXA Fall on same level from slipping, tripping and stumbling without subsequent striking against object, initial encounter: Secondary | ICD-10-CM | POA: Diagnosis not present

## 2024-01-24 DIAGNOSIS — R0781 Pleurodynia: Secondary | ICD-10-CM | POA: Diagnosis present

## 2024-01-24 DIAGNOSIS — S20211A Contusion of right front wall of thorax, initial encounter: Secondary | ICD-10-CM | POA: Insufficient documentation

## 2024-01-24 DIAGNOSIS — Z79899 Other long term (current) drug therapy: Secondary | ICD-10-CM | POA: Diagnosis not present

## 2024-01-24 DIAGNOSIS — I10 Essential (primary) hypertension: Secondary | ICD-10-CM | POA: Insufficient documentation

## 2024-01-24 DIAGNOSIS — Z7951 Long term (current) use of inhaled steroids: Secondary | ICD-10-CM | POA: Diagnosis not present

## 2024-01-24 DIAGNOSIS — R918 Other nonspecific abnormal finding of lung field: Secondary | ICD-10-CM | POA: Diagnosis not present

## 2024-01-24 DIAGNOSIS — Z7901 Long term (current) use of anticoagulants: Secondary | ICD-10-CM | POA: Insufficient documentation

## 2024-01-24 DIAGNOSIS — M549 Dorsalgia, unspecified: Secondary | ICD-10-CM | POA: Diagnosis not present

## 2024-01-24 DIAGNOSIS — I2510 Atherosclerotic heart disease of native coronary artery without angina pectoris: Secondary | ICD-10-CM | POA: Diagnosis not present

## 2024-01-24 DIAGNOSIS — J45909 Unspecified asthma, uncomplicated: Secondary | ICD-10-CM | POA: Insufficient documentation

## 2024-01-24 LAB — CBC
HCT: 35.6 % — ABNORMAL LOW (ref 39.0–52.0)
Hemoglobin: 12 g/dL — ABNORMAL LOW (ref 13.0–17.0)
MCH: 30.5 pg (ref 26.0–34.0)
MCHC: 33.7 g/dL (ref 30.0–36.0)
MCV: 90.6 fL (ref 80.0–100.0)
Platelets: 153 K/uL (ref 150–400)
RBC: 3.93 MIL/uL — ABNORMAL LOW (ref 4.22–5.81)
RDW: 12.5 % (ref 11.5–15.5)
WBC: 5.5 K/uL (ref 4.0–10.5)
nRBC: 0 % (ref 0.0–0.2)

## 2024-01-24 LAB — BASIC METABOLIC PANEL WITH GFR
Anion gap: 10 (ref 5–15)
BUN: 21 mg/dL (ref 8–23)
CO2: 22 mmol/L (ref 22–32)
Calcium: 8.7 mg/dL — ABNORMAL LOW (ref 8.9–10.3)
Chloride: 104 mmol/L (ref 98–111)
Creatinine, Ser: 1.11 mg/dL (ref 0.61–1.24)
GFR, Estimated: >60 mL/min (ref >60)
Glucose, Bld: 96 mg/dL (ref 70–99)
Potassium: 4 mmol/L (ref 3.5–5.1)
Sodium: 137 mmol/L (ref 135–145)

## 2024-01-24 LAB — PROTIME-INR
INR: 2 — ABNORMAL HIGH (ref 0.8–1.2)
Prothrombin Time: 24.1 s — ABNORMAL HIGH (ref 11.4–15.2)

## 2024-01-24 MED ORDER — OXYCODONE HCL 5 MG PO TABS: 5.0000 mg | ORAL_TABLET | Freq: Four times a day (QID) | ORAL | 0 refills | Status: AC | PRN

## 2024-01-24 MED ORDER — OXYCODONE-ACETAMINOPHEN 5-325 MG PO TABS
1.0000 | ORAL_TABLET | Freq: Once | ORAL | Status: AC
  Administered 2024-01-24: 1 via ORAL
  Filled 2024-01-24: qty 1

## 2024-01-24 MED ORDER — LIDOCAINE 5 % EX PTCH: 1.0000 | MEDICATED_PATCH | CUTANEOUS | 0 refills | Status: AC

## 2024-01-24 NOTE — ED Triage Notes (Signed)
 Pt reports slipped on a piece of paper getting up from bed , fell backward , landed on a objects on the floor . Co right lower back pain . Pt in wheelchair , wearing back support belt .  Denies head injury , no neck pain , no loc .  Alert and oriented x 4 .

## 2024-01-24 NOTE — ED Provider Notes (Signed)
 [place] [facility] Provider Note   CSN: [id number] Arrival date & time: 01/24/24  9065     Patient presents with: Brent Griffith is a 88 y.o. male.    Fall     Patient has a history of hypertension acid reflux coronary artery disease asthma chronic anticoagulation use.  Patient presents ED for evaluation after fall.  Patient states he was getting out of his bed when he slipped on a piece of paper that was on the floor.  He ended up falling backwards landing on his right rib area.  Patient states he is having pain on that area since this fall.  Increases with palpation and movement.  He denies hitting his head.  No loss of consciousness.  No shortness of breath.  Prior to Admission medications   Medication Sig Start Date End Date Taking? Authorizing Provider  lidocaine  (LIDODERM ) 5 % Place 1 patch onto the skin daily. Remove & Discard patch within 12 hours or as directed by MD 01/24/24  Yes Randol Simmonds, MD  oxyCODONE  (ROXICODONE ) 5 MG immediate release tablet Take 1 tablet (5 mg total) by mouth every 6 (six) hours as needed for up to 8 days for severe pain (pain score 7-10). 01/24/24 02/01/24 Yes Randol Simmonds, MD  albuterol  (VENTOLIN  HFA) 108 [id number] Base) MCG/ACT inhaler Inhale 1-2 puffs into the lungs every 6 (six) hours as needed for wheezing or shortness of breath. 07/03/22   Fargo, Amy E, NP  alum & mag hydroxide-simeth (MAALOX MAX) 400-400-40 MG/5ML suspension Take 10 mLs by mouth every 6 (six) hours as needed (pain with swallowing and reflux). 10/21/22   Ethyl Richerd BROCKS, MD  amiodarone  (PACERONE ) 200 MG tablet TAKE 1 TABLET BY MOUTH DAILY 01/14/24   [name], MD  Ascorbic Acid (VITAMIN C) 1000 MG tablet Take 1,000 mg by mouth daily. Patient not taking: Reported on 01/20/2024    [provider]  Calcium  Citrate-Vitamin D (EQ CALCIUM  CITRATE+D3 PO) Take 1,200 mg by mouth daily.    [provider]  carvedilol  (COREG ) 3.125  MG tablet TAKE 1 TABLET BY MOUTH TWICE  DAILY 10/01/23   [name], MD  Coenzyme Q10 (COQ10) 200 MG CAPS Take 200 mg by mouth daily.    [provider]  COVID-19 mRNA vaccine, Moderna, >/= 99yrs, (SPIKEVAX ) injection Inject 0.5 mLs into the muscle as directed. 01/16/24   [name], NP  doxazosin  (CARDURA ) 2 MG tablet TAKE 1 TABLET BY MOUTH DAILY 11/18/23   [name], MD  famotidine  (PEPCID ) 20 MG tablet TAKE 1 TABLET BY MOUTH TWICE  DAILY 10/07/23   McMichael, Bayley M, PA-C  finasteride  (PROSCAR ) 5 MG tablet TAKE 1 TABLET BY MOUTH IN  THE MORNING 10/06/21   Eubanks, Jessica K, NP  fluticasone  (FLONASE ) 50 MCG/ACT nasal spray Place 1 spray into both nostrils daily. Patient not taking: Reported on 01/20/2024 08/23/23   Griselda Norris, MD  furosemide  (LASIX ) 20 MG tablet TAKE 1 TABLET BY MOUTH DAILY AS  NEEDED FOR LEG SWELLING Patient not taking: Reported on 01/20/2024 10/01/23   [name], MD  Iron, Ferrous Sulfate, 325 (65 Fe) MG TABS Take 325 mg by mouth 2 (two) times daily. 09/13/21   [name], NP  JARDIANCE  10 MG TABS tablet TAKE 1 TABLET BY MOUTH DAILY BEFORE BREAKFAST 01/16/24   [name], MD  lactose free nutrition (BOOST) LIQD Take 237 mLs by mouth every Monday,  Wednesday, and Friday.    [provider]  losartan  (COZAAR ) 25 MG tablet TAKE 1 TABLET BY MOUTH DAILY  PLEASE KEEP SCHEDULED  APPOINTMENT WITH CARDIOLOGIST 01/01/24   [name], MD  mexiletine (MEXITIL) 150 MG capsule TAKE 1 CAPSULE BY MOUTH 2 TIMES A DAY 01/14/24   [name], MD  pantoprazole  (PROTONIX ) 40 MG tablet TAKE 1 TABLET BY MOUTH DAILY 03/01/23   Eubanks, Jessica K, NP  rosuvastatin  (CRESTOR ) 20 MG tablet TAKE 1 TABLET BY MOUTH ONCE  DAILY FOR CHOLESTEROL 07/29/23   Eubanks, Jessica K, NP  warfarin (COUMADIN ) 2.5 MG tablet TAKE 1/2 TO 1 TABLET BY MOUTH  DAILY AS DIRECTED BY THE  COUMADIN  CLINIC 01/01/24   [name], MD    Allergies: Compazine  [prochlorperazine edisylate]    Review of Systems  Updated Vital Signs BP 139/75 (BP Location: Left Arm)   Pulse (!) 58   Temp 98 F (36.7 C) (Oral)   Resp 16   Wt 54.9 kg   SpO2 95%   BMI 22.13 kg/m   Physical Exam Vitals and nursing note reviewed.  Constitutional:      Appearance: He is well-developed. He is not diaphoretic.     Comments: Elderly, frail  HENT:     Head: Normocephalic and atraumatic.     Right Ear: External ear normal.     Left Ear: External ear normal.  Eyes:     General: No scleral icterus.       Right eye: No discharge.        Left eye: No discharge.     Conjunctiva/sclera: Conjunctivae normal.  Neck:     Trachea: No tracheal deviation.  Cardiovascular:     Rate and Rhythm: Normal rate and regular rhythm.  Pulmonary:     Effort: Pulmonary effort is normal. No respiratory distress.     Breath sounds: Normal breath sounds. No stridor. No wheezing or rales.  Abdominal:     General: Bowel sounds are normal. There is no distension.     Palpations: Abdomen is soft.     Tenderness: There is no abdominal tenderness. There is no guarding or rebound.  Musculoskeletal:        General: Tenderness present. No deformity.     Cervical back: Neck supple. No signs of trauma. Normal range of motion.     Thoracic back: No deformity, tenderness or bony tenderness.     Lumbar back: No deformity, tenderness or bony tenderness.     Comments: Tenderness to palpation right posterior inferior ribs, linear area of bruising noted, no crepitus, no deformity,  Skin:    General: Skin is warm and dry.     Findings: No rash.  Neurological:     General: No focal deficit present.     Mental Status: He is alert.     Cranial Nerves: No cranial nerve deficit, dysarthria or facial asymmetry.     Sensory: No sensory deficit.     Motor: No abnormal muscle tone or seizure activity.     Coordination: Coordination normal.  Psychiatric:        Mood and Affect: Mood normal.      (all labs ordered are listed, but only abnormal results are displayed) Labs Reviewed  CBC - Abnormal; Notable for the following components:      Result Value   RBC 3.93 (*)    Hemoglobin 12.0 (*)    HCT 35.6 (*)    All other components within normal  limits  BASIC METABOLIC PANEL WITH GFR - Abnormal; Notable for the following components:   Calcium  8.7 (*)    All other components within normal limits  PROTIME-INR - Abnormal; Notable for the following components:   Prothrombin Time 24.1 (*)    INR 2.0 (*)    All other components within normal limits    EKG: None  Radiology: DG Ribs Unilateral W/Chest Right Result Date: 01/24/2024 CLINICAL DATA:  Back pain after fall. EXAM: RIGHT RIBS AND CHEST - 3+ VIEW COMPARISON:  August 22, 2023. FINDINGS: Stable cardiomediastinal silhouette. Status post coronary artery bypass graft. Minimal right basilar subsegmental atelectasis or scarring is noted. Left lung is clear. No evidence of right rib fracture. IMPRESSION: No evidence of right rib fracture. Minimal right basilar subsegmental atelectasis or scarring. Electronically Signed   By: Lynwood Landy Raddle M.D.   On: 01/24/2024 11:37     Procedures   Medications Ordered in the ED  oxyCODONE -acetaminophen  (PERCOCET/ROXICET) 5-325 MG per tablet 1 tablet (1 tablet Oral Given 01/24/24 1036)    Clinical Course as of 01/26/24 0924  Fri Jan 24, 2024  1058 CBC(!) Normal [JK]  1144 X-ray does not show any signs of rib fracture.  INR in the therapeutic range at 2.0.  Metabolic panel normal [JK]    Clinical Course User Index [JK] Randol Simmonds, MD                                 Medical Decision Making Problems Addressed: Contusion of right chest wall, initial encounter: acute illness or injury that poses a threat to life or bodily functions  Amount and/or Complexity of Data Reviewed Labs: ordered. Decision-making details documented in ED Course. Radiology: ordered and independent interpretation  performed.  Risk Prescription drug management.   Pt presented with right lateral rib pain after a fall.  Pt on anticoagulants.  No head injury, LOC, headache.  Head CT imaging not ordered for this reason.  CBC stable.  Doubt acute blood loss, retroperitoneal hematoma.  Xryas without signs of PTX, rib fracture.    Evaluation and diagnostic testing in the emergency department does not suggest an emergent condition requiring admission or immediate intervention beyond what has been performed at this time.  The patient is safe for discharge and has been instructed to return immediately for worsening symptoms, change in symptoms or any other concerns.      Final diagnoses:  Contusion of right chest wall, initial encounter    ED Discharge Orders          Ordered    oxyCODONE  (ROXICODONE ) 5 MG immediate release tablet  Every 6 hours PRN        01/24/24 1202    lidocaine  (LIDODERM ) 5 %  Every 24 hours        01/24/24 1202               Randol Simmonds, MD 01/26/24 [phone]

## 2024-01-24 NOTE — Discharge Instructions (Addendum)
 Take the medications to help with your pain and discomfort.  You can continue with acetaminophen  and reserve the oxycodone  for severe pain.  Return to the ER if you start having shortness of breath, vomiting, fever or other concerning symptoms.  Follow-up with your doctor next week to be rechecked

## 2024-02-05 ENCOUNTER — Emergency Department (HOSPITAL_BASED_OUTPATIENT_CLINIC_OR_DEPARTMENT_OTHER)
Admission: EM | Admit: 2024-02-05 | Discharge: 2024-02-05 | Disposition: A | Discharge status: Home / Self Care | Attending: Emergency Medicine | Admitting: Emergency Medicine

## 2024-02-05 ENCOUNTER — Emergency Department (HOSPITAL_BASED_OUTPATIENT_CLINIC_OR_DEPARTMENT_OTHER)

## 2024-02-05 ENCOUNTER — Encounter (HOSPITAL_BASED_OUTPATIENT_CLINIC_OR_DEPARTMENT_OTHER): Payer: Self-pay | Admitting: Urology

## 2024-02-05 ENCOUNTER — Other Ambulatory Visit: Payer: Self-pay

## 2024-02-05 DIAGNOSIS — Z79899 Other long term (current) drug therapy: Secondary | ICD-10-CM | POA: Insufficient documentation

## 2024-02-05 DIAGNOSIS — R0781 Pleurodynia: Secondary | ICD-10-CM | POA: Insufficient documentation

## 2024-02-05 DIAGNOSIS — R0789 Other chest pain: Secondary | ICD-10-CM

## 2024-02-05 DIAGNOSIS — R918 Other nonspecific abnormal finding of lung field: Secondary | ICD-10-CM | POA: Diagnosis not present

## 2024-02-05 DIAGNOSIS — Z7901 Long term (current) use of anticoagulants: Secondary | ICD-10-CM | POA: Insufficient documentation

## 2024-02-05 DIAGNOSIS — W010XXA Fall on same level from slipping, tripping and stumbling without subsequent striking against object, initial encounter: Secondary | ICD-10-CM | POA: Diagnosis not present

## 2024-02-05 DIAGNOSIS — I10 Essential (primary) hypertension: Secondary | ICD-10-CM | POA: Insufficient documentation

## 2024-02-05 MED ORDER — OXYCODONE HCL 5 MG PO TABS: 5.0000 mg | ORAL_TABLET | Freq: Four times a day (QID) | ORAL | 0 refills | Status: DC | PRN

## 2024-02-05 NOTE — ED Triage Notes (Signed)
 Pt states not feeling better from last visit on 9/19  Continued right lower back pain and unable to get pain patches that were prescribed because insurance  Pain worse with laying down

## 2024-02-05 NOTE — Discharge Instructions (Addendum)
 Repeat chest x-ray with right rib series without any acute findings.  Would recommend over-the-counter lidocaine  patches and continue your oxycodone  as needed for pain at night.  Make an appointment follow-up with your regular doctor.

## 2024-02-05 NOTE — ED Provider Notes (Addendum)
 Brent Griffith   CSN: [id number] Arrival date & time: 02/05/24  1722     Patient presents with: Back Pain   Brent Griffith is a 88 y.o. male.   Patient seen September 19 following a fall.  Patient has a history of hypertension acid reflux coronary disease chronic anticoagulation use.  Patient at that time said he was getting out of bed when he slipped on a piece of paper that was on the floor he ended up falling backwards landing on his right rib area.  No other injuries.  He denied hitting his head at that time no loss of consciousness.  Patient had x-ray rib series done without any acute findings.  Patient was ordered lidocaine  patches.  But says his pharmacy insurance company would not cover that.  Was not aware that they are available over-the-counter.  Patient is on Coumadin  and also on Pacerone .  Patient states that pain on the right lateral anterior rib area is getting worse.  No other concerns.  Does hurt to take a deep breath.       Prior to Admission medications   Medication Sig Start Date End Date Taking? Authorizing Provider  albuterol  (VENTOLIN  HFA) 108 (90 Base) MCG/ACT inhaler Inhale 1-2 puffs into the lungs every 6 (six) hours as needed for wheezing or shortness of breath. 07/03/22   Fargo, Amy E, NP  alum & mag hydroxide-simeth (MAALOX MAX) 400-400-40 MG/5ML suspension Take 10 mLs by mouth every 6 (six) hours as needed (pain with swallowing and reflux). 10/21/22   Ethyl Richerd BROCKS, MD  amiodarone  (PACERONE ) 200 MG tablet TAKE 1 TABLET BY MOUTH DAILY 01/14/24   [name], MD  Ascorbic Acid (VITAMIN C) 1000 MG tablet Take 1,000 mg by mouth daily. Patient not taking: Reported on 01/20/2024    [provider]  Calcium  Citrate-Vitamin D (EQ CALCIUM  CITRATE+D3 PO) Take 1,200 mg by mouth daily.    [provider]  carvedilol  (COREG ) 3.125 MG tablet TAKE 1 TABLET BY MOUTH TWICE  DAILY 10/01/23    [name], MD  Coenzyme Q10 (COQ10) 200 MG CAPS Take 200 mg by mouth daily.    [provider]  COVID-19 mRNA vaccine, Moderna, >/= 46yrs, (SPIKEVAX ) injection Inject 0.5 mLs into the muscle as directed. 01/16/24   [name], NP  doxazosin  (CARDURA ) 2 MG tablet TAKE 1 TABLET BY MOUTH DAILY 11/18/23   [name], MD  famotidine  (PEPCID ) 20 MG tablet TAKE 1 TABLET BY MOUTH TWICE  DAILY 10/07/23   McMichael, Bayley M, PA-C  finasteride  (PROSCAR ) 5 MG tablet TAKE 1 TABLET BY MOUTH IN  THE MORNING 10/06/21   Eubanks, Jessica K, NP  fluticasone  (FLONASE ) 50 MCG/ACT nasal spray Place 1 spray into both nostrils daily. Patient not taking: Reported on 01/20/2024 08/23/23   Griselda Norris, MD  furosemide  (LASIX ) 20 MG tablet TAKE 1 TABLET BY MOUTH DAILY AS  NEEDED FOR LEG SWELLING Patient not taking: Reported on 01/20/2024 10/01/23   [name], MD  Iron, Ferrous Sulfate, 325 (65 Fe) MG TABS Take 325 mg by mouth 2 (two) times daily. 09/13/21   [name], NP  JARDIANCE  10 MG TABS tablet TAKE 1 TABLET BY MOUTH DAILY BEFORE BREAKFAST 01/16/24   [name], MD  lactose free nutrition (BOOST) LIQD Take 237 mLs by mouth every Monday, Wednesday, and Friday.    [provider]  lidocaine  (LIDODERM ) 5 % Place  1 patch onto the skin daily. Remove & Discard patch within 12 hours or as directed by MD 01/24/24   Randol Simmonds, MD  losartan  (COZAAR ) 25 MG tablet TAKE 1 TABLET BY MOUTH DAILY  PLEASE KEEP SCHEDULED  APPOINTMENT WITH CARDIOLOGIST 01/01/24   [name], MD  mexiletine (MEXITIL) 150 MG capsule TAKE 1 CAPSULE BY MOUTH 2 TIMES A DAY 01/14/24   [name], MD  pantoprazole  (PROTONIX ) 40 MG tablet TAKE 1 TABLET BY MOUTH DAILY 03/01/23   Eubanks, Jessica K, NP  rosuvastatin  (CRESTOR ) 20 MG tablet TAKE 1 TABLET BY MOUTH ONCE  DAILY FOR CHOLESTEROL 07/29/23   Eubanks, Jessica K, NP  warfarin (COUMADIN ) 2.5 MG tablet TAKE 1/2 TO 1 TABLET BY MOUTH  DAILY AS  DIRECTED BY THE  COUMADIN  CLINIC 01/01/24   [name], MD    Allergies: Compazine [prochlorperazine edisylate]    Review of Systems  Constitutional:  Negative for chills and fever.  HENT:  Negative for ear pain and sore throat.   Eyes:  Negative for pain and visual disturbance.  Respiratory:  Positive for shortness of breath. Negative for cough.   Cardiovascular:  Positive for chest pain. Negative for palpitations.  Gastrointestinal:  Negative for abdominal pain and vomiting.  Genitourinary:  Negative for dysuria and hematuria.  Musculoskeletal:  Negative for arthralgias and back pain.  Skin:  Negative for color change and rash.  Neurological:  Negative for seizures and syncope.  All other systems reviewed and are negative.   Updated Vital Signs BP (!) 142/66 (BP Location: Left Arm)   Pulse (!) 53   Temp 97.7 F (36.5 C) (Oral)   Resp 16   Ht 1.575 m (5' 2)   Wt 54 kg   SpO2 98%   BMI 21.77 kg/m   Physical Exam Vitals and nursing Griffith reviewed.  Constitutional:      General: He is not in acute distress.    Appearance: Normal appearance. He is well-developed.  HENT:     Head: Normocephalic and atraumatic.  Eyes:     Conjunctiva/sclera: Conjunctivae normal.     Pupils: Pupils are equal, round, and reactive to light.  Cardiovascular:     Rate and Rhythm: Normal rate and regular rhythm.     Heart sounds: No murmur heard. Pulmonary:     Effort: Pulmonary effort is normal. No respiratory distress.     Breath sounds: Normal breath sounds. No wheezing, rhonchi or rales.     Comments: Tenderness to palpation right lower ribs anteriorly and some laterally.  No crepitance. Chest:     Chest wall: Tenderness present.  Abdominal:     General: There is no distension.     Palpations: Abdomen is soft.     Tenderness: There is no abdominal tenderness. There is no guarding.  Musculoskeletal:        General: No swelling.     Cervical back: Neck supple.     Right lower  leg: No edema.     Left lower leg: No edema.  Skin:    General: Skin is warm and dry.     Capillary Refill: Capillary refill takes less than 2 seconds.  Neurological:     General: No focal deficit present.     Mental Status: He is alert and oriented to person, place, and time.  Psychiatric:        Mood and Affect: Mood normal.     (all labs ordered are listed, but only abnormal results  are displayed) Labs Reviewed - No data to display  EKG: EKG Interpretation Date/Time:  Wednesday February 05 2024 17:48:02 EDT Ventricular Rate:  48 PR Interval:  170 QRS Duration:  134 QT Interval:  505 QTC Calculation: 452 R Axis:   195  Text Interpretation: Right and left arm electrode reversal, interpretation assumes no reversal Sinus bradycardia Left bundle branch block Anterior Q waves, possibly due to LVH Nonspecific T abnormalities, lateral leads Confirmed by Geraldene Hamilton [id number]) on 02/05/2024 6:02:05 PM  Radiology: No results found.   Procedures   Medications Ordered in the ED - No data to display                                  Medical Decision Making Amount and/or Complexity of Data Reviewed Radiology: ordered.   Patient's vital signs here are reassuring temp 97.7.  Heart rate was 53.  Blood pressure 142/66 oxygen  saturation 98% on room air and respirations 16.  Based on the bradycardia EKG was done.  Look like it was right arm left arm reversal.  Left bundle branch block pattern still persist.  No significant change compared to old EKGs.  Chest x-ray with right rib series without any acute findings.  Will treat patient symptomatically.  Final diagnoses:  Chest wall pain    ED Discharge Orders     None          Geraldene Hamilton, MD 02/05/24 [id number]    Geraldene Hamilton, MD 02/05/24 8083    Geraldene Hamilton, MD 02/05/24 1944

## 2024-02-10 ENCOUNTER — Telehealth: Payer: Self-pay

## 2024-02-10 NOTE — Patient Instructions (Signed)
 Norleen SHAUNNA Creamer - I am sorry I was unable to reach you today for our scheduled appointment. I work with [name], NP and am calling to support your healthcare needs. Please contact me at [phone] at your earliest convenience. I look forward to speaking with you soon.   Thank you,   Clayborne Ly RN BSN CCM [place]  [facility], [facility] Health Nurse Care Coordinator  Direct Dial: [phone] Website: [email or web address]

## 2024-02-12 ENCOUNTER — Telehealth: Payer: Self-pay | Admitting: Gastroenterology

## 2024-02-12 DIAGNOSIS — K219 Gastro-esophageal reflux disease without esophagitis: Secondary | ICD-10-CM

## 2024-02-12 NOTE — Telephone Encounter (Signed)
 Inbound call from patient stating he is needing a refill for Protonix . He is requesting it be sent to [facility]. Patient has been scheduled for OV on 11/24 at 3:00 with Alan Coombs. Please advise.

## 2024-02-13 ENCOUNTER — Ambulatory Visit: Attending: Cardiology | Admitting: *Deleted

## 2024-02-13 DIAGNOSIS — I4891 Unspecified atrial fibrillation: Secondary | ICD-10-CM

## 2024-02-13 DIAGNOSIS — Z5181 Encounter for therapeutic drug level monitoring: Secondary | ICD-10-CM | POA: Diagnosis not present

## 2024-02-13 LAB — POCT INR: POC INR: 3.8

## 2024-02-13 MED ORDER — PANTOPRAZOLE SODIUM 40 MG PO TBEC: 40.0000 mg | DELAYED_RELEASE_TABLET | Freq: Every day | ORAL | 0 refills | Status: DC

## 2024-02-13 NOTE — Patient Instructions (Addendum)
 Description   INR today 3.8  Hold warfarin tomorrow Then continue to take warfarin 1/2 a tablet daily except for 1 tablet on Fridays. Recheck INR in 3 weeks.  Stay consistent with boost/ensure (Mon, [place], and Fri)  Coumadin  Clinic [phone]

## 2024-02-13 NOTE — Progress Notes (Signed)
 Lab Results  Component Value Date   INR 3.8 02/13/2024   INR 2.0 (H) 01/24/2024   INR 1.8 (A) 01/16/2024    Description   INR today 3.8  Hold warfarin tomorrow Then continue to take warfarin 1/2 a tablet daily except for 1 tablet on Fridays. Recheck INR in 3 weeks.  Stay consistent with boost/ensure (Mon, [place], and Fri)  Coumadin  Clinic [phone]

## 2024-02-13 NOTE — Telephone Encounter (Signed)
 Protonix refilled

## 2024-02-27 ENCOUNTER — Telehealth: Payer: Self-pay

## 2024-02-27 NOTE — Patient Instructions (Signed)
 Norleen SHAUNNA Creamer - I am sorry I was unable to reach you today for our scheduled appointment. I work with [name], NP and am calling to support your healthcare needs. Please contact me at [phone] at your earliest convenience. I look forward to speaking with you soon.   Thank you,   Clayborne Ly RN BSN CCM [place]  [facility], [facility] Health Nurse Care Coordinator  Direct Dial: [phone] Website: [email or web address]

## 2024-03-05 ENCOUNTER — Ambulatory Visit: Attending: Cardiology | Admitting: *Deleted

## 2024-03-05 DIAGNOSIS — Z5181 Encounter for therapeutic drug level monitoring: Secondary | ICD-10-CM

## 2024-03-05 DIAGNOSIS — I4891 Unspecified atrial fibrillation: Secondary | ICD-10-CM | POA: Diagnosis not present

## 2024-03-05 LAB — POCT INR: POC INR: 1.8

## 2024-03-05 NOTE — Progress Notes (Signed)
 Lab Results  Component Value Date   INR 1.8 03/05/2024   INR 3.8 02/13/2024   INR 2.0 (H) 01/24/2024   Description   INR today 1.8 Take 1 tablet of warfarin today  Then continue to take warfarin 1/2 a tablet daily except for 1 tablet on Fridays. Recheck INR in 3 weeks.  Stay consistent with boost/ensure (Mon, [place], and Fri)  Coumadin  Clinic [phone]

## 2024-03-05 NOTE — Patient Instructions (Signed)
 Description   INR today 1.8 Take 1 tablet of warfarin today  Then continue to take warfarin 1/2 a tablet daily except for 1 tablet on Fridays. Recheck INR in 3 weeks.  Stay consistent with boost/ensure (Mon, [place], and Fri)  Coumadin  Clinic [phone]

## 2024-03-16 ENCOUNTER — Telehealth: Payer: Self-pay | Admitting: Pharmacist

## 2024-03-16 ENCOUNTER — Telehealth: Payer: Self-pay

## 2024-03-16 DIAGNOSIS — Z79899 Other long term (current) drug therapy: Secondary | ICD-10-CM

## 2024-03-16 NOTE — Progress Notes (Signed)
   03/16/2024  Patient ID: Brent Griffith, male   DOB: 01-13-1936, 88 y.o.   MRN: [id number]  Received a message from clinic staff that the Patient contacted the office wondering if his Jardiance  would be covered on his insurance.  Called [facility].  The Pharmacist confirmed Jardiance  is ready for pick up with a $0 copay.  Message sent back to the clinic staff.   Cassius DOROTHA Brought, PharmD, BCACP Clinical Pharmacist [phone]

## 2024-03-16 NOTE — Telephone Encounter (Signed)
 Copied from CRM [id number]. Topic: Clinical - Prescription Issue >> Mar 13, 2024  4:49 PM Brent Griffith ORN wrote: Reason for CRM: Patient called. Wanted to see if his Jardiance  was going to be paid for. [place] Medicaid [place] it. He only has 3 left - 5 days. Thank You

## 2024-03-16 NOTE — Telephone Encounter (Signed)
 Jolee Cassius PARAS, RPH to Me (Selected Message)     03/16/24  9:39 AM Patient's medication is ready at [facility] with a copay of $0   Blessings,   Cassius DOROTHA Jolee, PharmD, BCACP Clinical Pharmacist [phone]   I called patient and left a detailed message with response above

## 2024-03-18 ENCOUNTER — Other Ambulatory Visit (HOSPITAL_BASED_OUTPATIENT_CLINIC_OR_DEPARTMENT_OTHER): Payer: Self-pay

## 2024-03-18 ENCOUNTER — Telehealth: Payer: Self-pay | Admitting: Cardiology

## 2024-03-18 NOTE — Telephone Encounter (Signed)
*  STAT* If patient is at the pharmacy, call can be transferred to refill team.   1. Which medications need to be refilled? (please list name of each medication and dose if known) mexiletine (MEXITIL) 150 MG capsule   2. Which pharmacy/location (including street and city if local pharmacy) is medication to be sent to? [facility]   3. Do they need a 30 day or 90 day supply? 90  Patient is out of [facility]

## 2024-03-19 ENCOUNTER — Other Ambulatory Visit (HOSPITAL_BASED_OUTPATIENT_CLINIC_OR_DEPARTMENT_OTHER): Payer: Self-pay

## 2024-03-19 MED ORDER — MEXILETINE HCL 150 MG PO CAPS
150.0000 mg | ORAL_CAPSULE | Freq: Two times a day (BID) | ORAL | 0 refills | Status: AC
  Filled 2024-03-19: qty 180, 90d supply, fill #0

## 2024-03-19 NOTE — Telephone Encounter (Signed)
 Refill sent to [facility].

## 2024-03-20 ENCOUNTER — Other Ambulatory Visit: Payer: Self-pay

## 2024-03-20 ENCOUNTER — Ambulatory Visit: Admitting: Nurse Practitioner

## 2024-03-20 DIAGNOSIS — I10 Essential (primary) hypertension: Secondary | ICD-10-CM

## 2024-03-20 DIAGNOSIS — J302 Other seasonal allergic rhinitis: Secondary | ICD-10-CM

## 2024-03-20 DIAGNOSIS — I4891 Unspecified atrial fibrillation: Secondary | ICD-10-CM

## 2024-03-20 NOTE — Patient Instructions (Signed)
 Visit Information  Thank you for taking time to visit with me today. Please don't hesitate to contact me if I can be of assistance to you before our next scheduled appointment.  Your next care management appointment is by telephone on 04/14/24 at 12:15 PM   Please call the care guide team at [phone] if you need to cancel, schedule, or reschedule an appointment.   Please call the Suicide and Crisis Lifeline: 988 if you are experiencing a Mental Health or Behavioral Health Crisis or need someone to talk to.  Clayborne Ly RN BSN CCM   [facility], [facility] Health Nurse Care Coordinator  Direct Dial: [phone] Website: [email or web address]

## 2024-03-20 NOTE — Patient Outreach (Signed)
 Complex Care Management   Visit Note  03/20/2024  Name:  Brent Griffith MRN: [id number] DOB: 07-02-1935  Situation: Referral received for Complex Care Management related to Atrial Fibrillation, CAD, Hypertension.  I obtained verbal consent from Patient.  Visit completed with Patient on the phone.  Background:   Past Medical History:  Diagnosis Date   Acute gastric ulcer without mention of hemorrhage, perforation, or obstruction    Allergic rhinitis, cause unspecified    Anemia, unspecified    Arthritis    Asthma    Blood transfusion without reported diagnosis    CAD (coronary artery disease)    Diaphragmatic hernia without mention of obstruction or gangrene    Elevated prostate specific antigen (PSA)    Enlarged prostate    Esophageal reflux    Extrinsic asthma, unspecified    Herpes zoster without mention of complication    Hyperlipidemia    Hypersomnia with sleep apnea, unspecified    Impotence of organic origin    Kyphosis    Lumbago    Obstructive sleep apnea (adult) (pediatric)    Pneumonia    hx of years ago    Senile osteoporosis    Trigger finger (acquired)    Tubular adenoma of colon 05/2013   Unspecified essential hypertension    Unspecified sleep apnea    CPAP- settings 4-12    Unspecified vitamin D deficiency     Assessment: Patient Reported Symptoms:  Cognitive Cognitive Status: Alert and oriented to person, place, and time, Normal speech and language skills Cognitive/Intellectual Conditions Management [RPT]: None reported or documented in medical history or problem list   Health Maintenance Behaviors: Annual physical exam Health Facilitated by: Rest  Neurological Neurological Review of Symptoms: No symptoms reported    HEENT HEENT Symptoms Reported: Nasal discharge, Other: (post nasal drainage) HEENT Management Strategies: Medication therapy HEENT Self-Management Outcome: 4 (good)    Cardiovascular Cardiovascular Symptoms Reported: No symptoms  reported Does patient have uncontrolled Hypertension?: No Cardiovascular Management Strategies: Medication therapy, Routine screening Cardiovascular Self-Management Outcome: 4 (good)  Respiratory Respiratory Symptoms Reported: No symptoms reported    Endocrine Endocrine Symptoms Reported: No symptoms reported Is patient diabetic?: No    Gastrointestinal Gastrointestinal Symptoms Reported: Other Other Gastrointestinal Symptoms: dysphagia Gastrointestinal Self-Management Outcome: 4 (good) Gastrointestinal Comment: wears neck brace to help improve swallowing due to severe kyphosis    Genitourinary Genitourinary Symptoms Reported: No symptoms reported    Integumentary Integumentary Symptoms Reported: No symptoms reported    Musculoskeletal Musculoskelatal Symptoms Reviewed: Limited mobility, Difficulty walking, Unsteady gait Additional Musculoskeletal Details: upper thoracic compression fractures with  ankylosis and kyphosis.  wears cervical thoracic brace Musculoskeletal Management Strategies: Medical device, Routine screening Musculoskeletal Self-Management Outcome: 4 (good) Falls in the past year?: Yes Number of falls in past year: 2 or more Was there an injury with Fall?: Yes Fall Risk Category Calculator: 3 Patient Fall Risk Level: High Fall Risk Patient at Risk for Falls Due to: Impaired balance/gait, Impaired mobility Fall risk Follow up: Education provided, Falls evaluation completed, Falls prevention discussed  Psychosocial Psychosocial Symptoms Reported: No symptoms reported   Major Change/Loss/Stressor/Fears (CP): Denies Quality of Family Relationships: helpful, involved, supportive Do you feel physically threatened by others?: No    03/20/2024    PHQ2-9 Depression Screening   Brent Griffith interest or pleasure in doing things    Feeling down, depressed, or hopeless    PHQ-2 - Total Score    Trouble falling or staying asleep, or sleeping too much    Feeling  tired or having  Brent Griffith energy    Poor appetite or overeating     Feeling bad about yourself - or that you are a failure or have let yourself or your family down    Trouble concentrating on things, such as reading the newspaper or watching television    Moving or speaking so slowly that other people could have noticed.  Or the opposite - being so fidgety or restless that you have been moving around a lot more than usual    Thoughts that you would be better off dead, or hurting yourself in some way    PHQ2-9 Total Score    If you checked off any problems, how difficult have these problems made it for you to do your work, take care of things at home, or get along with other people    Depression Interventions/Treatment      There were no vitals filed for this visit. Pain Scale: Not given for pain  Medications Reviewed Today     Reviewed by Morgan Clayborne CROME, RN (Registered Nurse) on 03/20/24 at 1310  Med List Status: <None>   Medication Order Taking? Sig Documenting Provider Last Dose Status Informant  albuterol  (VENTOLIN  HFA) 108 (90 Base) MCG/ACT inhaler [id number]  Inhale 1-2 puffs into the lungs every 6 (six) hours as needed for wheezing or shortness of breath. Fargo, Amy E, NP  Active   alum & mag hydroxide-simeth (MAALOX MAX) 400-400-40 MG/5ML suspension [id number]  Take 10 mLs by mouth every 6 (six) hours as needed (pain with swallowing and reflux). Ethyl Richerd BROCKS, MD  Active   amiodarone  (PACERONE ) 200 MG tablet [id number]  TAKE 1 TABLET BY MOUTH DAILY [name], MD  Active   Ascorbic Acid (VITAMIN C) 1000 MG tablet [id number]  Take 1,000 mg by mouth daily.  Patient not taking: Reported on 01/20/2024   [provider]  Active   Calcium  Citrate-Vitamin D (EQ CALCIUM  CITRATE+D3 PO) [id number]  Take 1,200 mg by mouth daily. [provider]  Active Self           Med Note MARTHA DEVIN BROCKS Pablo May 27, 2023  2:23 PM)    carvedilol  (COREG ) 3.125 MG tablet [id number]  TAKE 1  TABLET BY MOUTH TWICE  DAILY [name], MD  Active   Coenzyme Q10 (COQ10) 200 MG CAPS [id number]  Take 200 mg by mouth daily. [provider]  Active Self  COVID-19 mRNA vaccine, Moderna, >/= 77yrs, (SPIKEVAX ) injection [id number]  Inject 0.5 mLs into the muscle as directed. [name], NP  Active   doxazosin  (CARDURA ) 2 MG tablet [id number]  TAKE 1 TABLET BY MOUTH DAILY [name], MD  Active   famotidine  (PEPCID ) 20 MG tablet [id number]  TAKE 1 TABLET BY MOUTH TWICE  DAILY McMichael, Bayley M, PA-C  Active   finasteride  (PROSCAR ) 5 MG tablet [id number]  TAKE 1 TABLET BY MOUTH IN  THE MORNING Eubanks, Jessica K, NP  Active   fluticasone  (FLONASE ) 50 MCG/ACT nasal spray [id number]  Place 1 spray into both nostrils daily.  Patient not taking: Reported on 01/20/2024   Griselda Norris, MD  Active   furosemide  (LASIX ) 20 MG tablet [id number]  TAKE 1 TABLET BY MOUTH DAILY AS  NEEDED FOR LEG SWELLING  Patient not taking: Reported on 01/20/2024   [name], MD  Active   Iron, Ferrous Sulfate, 325 (65 Fe) MG TABS [id number]  Take 325 mg by mouth 2 (two) times  daily. [name], NP  Active   JARDIANCE  10 MG TABS tablet [id number]  TAKE 1 TABLET BY MOUTH DAILY BEFORE BREAKFAST [name], MD  Active   lactose free nutrition (BOOST) LIQD [id number]  Take 237 mLs by mouth every Monday, Wednesday, and Friday. [provider]  Active   lidocaine  (LIDODERM ) 5 % [id number]  Place 1 patch onto the skin daily. Remove & Discard patch within 12 hours or as directed by MD Randol Simmonds, MD  Active   losartan  (COZAAR ) 25 MG tablet [id number]  TAKE 1 TABLET BY MOUTH DAILY  PLEASE KEEP SCHEDULED  APPOINTMENT WITH CARDIOLOGIST [name], MD  Active   mexiletine (MEXITIL) 150 MG capsule [id number]  Take 1 capsule (150 mg total) by mouth 2 (two) times daily. [name], MD  Active   oxyCODONE  (ROXICODONE ) 5 MG immediate release tablet [id number]  Take 1 tablet (5  mg total) by mouth every 6 (six) hours as needed for severe pain (pain score 7-10). Zackowski, Scott, MD  Active   pantoprazole  (PROTONIX ) 40 MG tablet [id number]  Take 1 tablet (40 mg total) by mouth daily. Mollie Pfeiffer M, PA-C  Active   rosuvastatin  (CRESTOR ) 20 MG tablet [id number]  TAKE 1 TABLET BY MOUTH ONCE  DAILY FOR CHOLESTEROL Eubanks, Jessica K, NP  Active   warfarin (COUMADIN ) 2.5 MG tablet [id number]  TAKE 1/2 TO 1 TABLET BY MOUTH  DAILY AS DIRECTED BY THE  COUMADIN  CLINIC Poteet, Redell RAMAN, MD  Active   Med List Note Vernel Reggy BIRCH, MD 07/15/14 2050): CPAP Advanced AutoPap 6-12            Recommendation:   PCP Follow-up  03/30/2024 Status: Sch   Time: 11:00 AM Length: 20  Visit Type: OFFICE VISIT [8002] Copay: $0.00  Provider: [name], NP Department: [facility]   Specialty provider follow-up  03/30/2024 Status: Sch   Time: 3:00 PM Length: 20  Visit Type: FOLLOW UP 20 [336] Copay: $0.00  Provider: Craig Alan SAUNDERS, PA-C Department: [facility]    Follow Up Plan:   Telephone follow up appointment with social worker Tobias Moose BSW date/time: 03/31/24 at 11:45 AM Telephone follow up appointment with nurse Clayborne Ly RN date/time: 04/14/24 at 12:15 PM Referral to Pharmacist   : Clayborne Ly RN BSN CCM [place]  [facility], [facility] Health Nurse Care Coordinator  Direct Dial: [phone] Website: [email or web address]

## 2024-03-26 ENCOUNTER — Encounter: Payer: Self-pay | Admitting: Pharmacist

## 2024-03-26 NOTE — Progress Notes (Unsigned)
   03/26/2024  Patient ID: Brent Griffith, male   DOB: [birth year], 88 y.o.   MRN: [id number]   Received a referral from Clayborne Ly, RN citing the drug interaction alert for cetirizine and older adults.  Patient is currently taking cetirizine 10 mg daily. Per manufacturer guidelines, older adults may require a lower dose (5 mg daily) due to increased sensitivity and the potential for enhanced sedation.

## 2024-03-27 ENCOUNTER — Ambulatory Visit: Attending: Cardiology | Admitting: Pharmacist

## 2024-03-27 DIAGNOSIS — Z5181 Encounter for therapeutic drug level monitoring: Secondary | ICD-10-CM

## 2024-03-27 DIAGNOSIS — I4891 Unspecified atrial fibrillation: Secondary | ICD-10-CM

## 2024-03-27 LAB — POCT INR: INR: 1.6 — AB (ref 2.0–3.0)

## 2024-03-27 NOTE — Progress Notes (Signed)
 Description   INR today 1.6 Take 2 tablets of warfarin today  Then continue to take warfarin 1/2 a tablet daily except for 1 tablet on Mondays and Fridays. Recheck INR in 3 weeks.  Stay consistent with boost/ensure (Mon, [place], and Fri)  Coumadin  Clinic [phone]

## 2024-03-27 NOTE — Patient Instructions (Addendum)
 Description   INR today 1.6 Take 2 tablets of warfarin today  Then continue to take warfarin 1/2 a tablet daily except for 1 tablet on Mondays and Fridays. Recheck INR in 3 weeks.  Stay consistent with boost/ensure (Mon, [place], and Fri)  Coumadin  Clinic [phone]

## 2024-03-30 ENCOUNTER — Ambulatory Visit: Payer: Self-pay | Admitting: Nurse Practitioner

## 2024-03-30 ENCOUNTER — Ambulatory Visit: Admitting: Physician Assistant

## 2024-03-30 NOTE — Progress Notes (Deleted)
 03/30/2024 Brent Griffith [id number] Jun 05, 1935  Referring provider: [name], NP Primary GI doctor: {[id number]} (Dr. Aneita)  ASSESSMENT AND PLAN:  IDA 01/24/2024  HGB 12.0 MCV 90.6 Platelets 153 03/16/2022 Iron 96 Ferritin 75  Recent Labs    08/23/23 0258 01/24/24 1033  HGB 13.3 12.0*  X 2018 Has followed with hematology received IV iron in the past   GERD 01/14/2021 CTAP W chronic gastric wall thickening not changed from 2015, cholelithiasis  Personal history of adenomatous polyps January 2015 6 mm TA polyp No future endoscopy planned secondary to age  Chronic combined systolic/diastolic heart failure 09/12/2023 EF 50 to 55% grade 1 diastolic dysfunction mild MR mild AR no aortic stenosis  PAF On Coumadin   CAD status post bypass  Patient Care Team: [name], NP as PCP - General (Geriatric Medicine) [name], MD as PCP - Cardiology (Cardiology) [name], MD as PCP - Electrophysiology (Cardiology) [name], MD as Consulting Physician (Sports Medicine) [name], MD as Referring Physician (Ophthalmology) Morgan, Clayborne CROME, RN as [facility] Care Management Maranda Lister D as Social Worker  HISTORY OF PRESENT ILLNESS: 88 y.o. male with a past medical history of GERD, adenomatous polyps, chronic combined systolic/diastolic heart failure, CAD s/p CABG, PAF (on chronic anticoagulation), BPH and others listed below presents for evaluation of ***.   Last seen in the office 12/14/2022 by Con Blower, PA  *** Discussed the use of AI scribe software for clinical note transcription with the patient, who gave verbal consent to proceed.  History of Present Illness            He  reports that he has never smoked. He has never been exposed to tobacco smoke. He has never used smokeless tobacco. He reports that he does not drink alcohol and does not use drugs.  RELEVANT GI HISTORY, IMAGING AND LABS: Results           CBC    Component Value Date/Time   WBC 5.5 01/24/2024 1033   RBC 3.93 (L) 01/24/2024 1033   HGB 12.0 (L) 01/24/2024 1033   HGB 11.9 (L) 01/18/2023 1450   HGB 12.9 (L) 12/28/2016 1508   HCT 35.6 (L) 01/24/2024 1033   HCT 33.2 (L) 01/18/2023 1450   HCT 36.9 (L) 12/28/2016 1508   PLT 153 01/24/2024 1033   PLT 198 01/18/2023 1450   MCV 90.6 01/24/2024 1033   MCV 99 (H) 01/18/2023 1450   MCV 88 12/28/2016 1508   MCH 30.5 01/24/2024 1033   MCHC 33.7 01/24/2024 1033   RDW 12.5 01/24/2024 1033   RDW 12.0 01/18/2023 1450   RDW 12.2 12/28/2016 1508   LYMPHSABS 0.5 (L) 08/23/2023 0258   LYMPHSABS 1.6 12/28/2016 1508   MONOABS 0.8 08/23/2023 0258   EOSABS 0.0 08/23/2023 0258   EOSABS 0.1 12/28/2016 1508   BASOSABS 0.0 08/23/2023 0258   BASOSABS 0.0 12/28/2016 1508   Recent Labs    08/23/23 0258 01/24/24 1033  HGB 13.3 12.0*    CMP     Component Value Date/Time   NA 137 01/24/2024 1033   NA 140 10/03/2023 1436   K 4.0 01/24/2024 1033   K 4.1 08/23/2016 1312   CL 104 01/24/2024 1033   CL 103 08/23/2016 1312   CO2 22 01/24/2024 1033   CO2 26 08/23/2016 1312   GLUCOSE 96 01/24/2024 1033   BUN 21 01/24/2024 1033   BUN 21 10/03/2023 1436   CREATININE 1.11 01/24/2024  1033   CREATININE 1.36 (H) 10/22/2022 1557   CALCIUM  8.7 (L) 01/24/2024 1033   CALCIUM  8.7 08/23/2016 1312   PROT 6.0 10/03/2023 1436   ALBUMIN 3.8 10/03/2023 1436   ALBUMIN 4.0 08/23/2016 1312   AST 29 10/03/2023 1436   AST 18 08/23/2016 1312   ALT 25 10/03/2023 1436   ALT 15 08/23/2016 1312   ALKPHOS 57 10/03/2023 1436   ALKPHOS 45 08/23/2016 1312   BILITOT 0.8 10/03/2023 1436   GFRNONAA >60 01/24/2024 1033   GFRNONAA 63 10/31/2020 1549   GFRAA 73 10/31/2020 1549      Latest Ref Rng & Units 10/03/2023    2:36 PM 01/18/2023    2:50 PM 08/31/2022    3:15 PM  Hepatic Function  Total Protein 6.0 - 8.5 g/dL 6.0  6.1  5.8   Albumin 3.7 - 4.7 g/dL 3.8  3.7  4.0   AST 0 - 40 IU/L 29  22  27    ALT 0 -  44 IU/L 25  23  23    Alk Phosphatase 44 - 121 IU/L 57  59  59   Total Bilirubin 0.0 - 1.2 mg/dL 0.8  0.6  1.0       Current Medications:   Current Outpatient Medications (Endocrine & Metabolic):    JARDIANCE  10 MG TABS tablet, TAKE 1 TABLET BY MOUTH DAILY BEFORE BREAKFAST  Current Outpatient Medications (Cardiovascular):    amiodarone  (PACERONE ) 200 MG tablet, TAKE 1 TABLET BY MOUTH DAILY   carvedilol  (COREG ) 3.125 MG tablet, TAKE 1 TABLET BY MOUTH TWICE  DAILY   doxazosin  (CARDURA ) 2 MG tablet, TAKE 1 TABLET BY MOUTH DAILY   furosemide  (LASIX ) 20 MG tablet, TAKE 1 TABLET BY MOUTH DAILY AS  NEEDED FOR LEG SWELLING (Patient not taking: Reported on 01/20/2024)   losartan  (COZAAR ) 25 MG tablet, TAKE 1 TABLET BY MOUTH DAILY  PLEASE KEEP SCHEDULED  APPOINTMENT WITH CARDIOLOGIST   mexiletine (MEXITIL ) 150 MG capsule, Take 1 capsule (150 mg total) by mouth 2 (two) times daily.   rosuvastatin  (CRESTOR ) 20 MG tablet, TAKE 1 TABLET BY MOUTH ONCE  DAILY FOR CHOLESTEROL  Current Outpatient Medications (Respiratory):    albuterol  (VENTOLIN  HFA) 108 (90 Base) MCG/ACT inhaler, Inhale 1-2 puffs into the lungs every 6 (six) hours as needed for wheezing or shortness of breath.   cetirizine (ZYRTEC) 10 MG tablet, Take 10 mg by mouth daily as needed for allergies.   fluticasone  (FLONASE ) 50 MCG/ACT nasal spray, Place 1 spray into both nostrils daily. (Patient not taking: Reported on 03/20/2024)  Current Outpatient Medications (Analgesics):    oxyCODONE  (ROXICODONE ) 5 MG immediate release tablet, Take 1 tablet (5 mg total) by mouth every 6 (six) hours as needed for severe pain (pain score 7-10).  Current Outpatient Medications (Hematological):    Iron, Ferrous Sulfate, 325 (65 Fe) MG TABS, Take 325 mg by mouth 2 (two) times daily.   warfarin (COUMADIN ) 2.5 MG tablet, TAKE 1/2 TO 1 TABLET BY MOUTH  DAILY AS DIRECTED BY THE  COUMADIN  CLINIC  Current Outpatient Medications (Other):    alum & mag  hydroxide-simeth (MAALOX MAX) 400-400-40 MG/5ML suspension, Take 10 mLs by mouth every 6 (six) hours as needed (pain with swallowing and reflux).   Ascorbic Acid (VITAMIN C) 1000 MG tablet, Take 1,000 mg by mouth daily. (Patient not taking: Reported on 01/20/2024)   Calcium  Citrate-Vitamin D (EQ CALCIUM  CITRATE+D3 PO), Take 1,200 mg by mouth daily.   Coenzyme Q10 (COQ10) 200 MG CAPS, Take  200 mg by mouth daily.   COVID-19 mRNA vaccine, Moderna, >/= 20yrs, (SPIKEVAX ) injection, Inject 0.5 mLs into the muscle as directed.   famotidine  (PEPCID ) 20 MG tablet, TAKE 1 TABLET BY MOUTH TWICE  DAILY   finasteride  (PROSCAR ) 5 MG tablet, TAKE 1 TABLET BY MOUTH IN  THE MORNING   lactose free nutrition (BOOST) LIQD, Take 237 mLs by mouth every Monday, Wednesday, and Friday.   lidocaine  (LIDODERM ) 5 %, Place 1 patch onto the skin daily. Remove & Discard patch within 12 hours or as directed by MD   pantoprazole  (PROTONIX ) 40 MG tablet, Take 1 tablet (40 mg total) by mouth daily.  Medical History:  Past Medical History:  Diagnosis Date   Acute gastric ulcer without mention of hemorrhage, perforation, or obstruction    Allergic rhinitis, cause unspecified    Anemia, unspecified    Arthritis    Asthma    Blood transfusion without reported diagnosis    CAD (coronary artery disease)    Diaphragmatic hernia without mention of obstruction or gangrene    Elevated prostate specific antigen (PSA)    Enlarged prostate    Esophageal reflux    Extrinsic asthma, unspecified    Herpes zoster without mention of complication    Hyperlipidemia    Hypersomnia with sleep apnea, unspecified    Impotence of organic origin    Kyphosis    Lumbago    Obstructive sleep apnea (adult) (pediatric)    Pneumonia    hx of years ago    Senile osteoporosis    Trigger finger (acquired)    Tubular adenoma of colon 05/2013   Unspecified essential hypertension    Unspecified sleep apnea    CPAP- settings 4-12    Unspecified  vitamin D deficiency    Allergies:  Allergies  Allergen Reactions   Compazine [Prochlorperazine Edisylate] Anxiety     Surgical History:  He  has a past surgical history that includes Tonsillectomy (1945); Mole removal (1958); Cardiac catheterization (03/04/2009); Colonoscopy; Coronary artery bypass graft (2010); Transurethral resection of prostate (N/A, 09/24/2013); Cystoscopy with retrograde pyelogram, ureteroscopy and stent placement (Right, 10/04/2013); Rotator cuff repair (Left, 04/1998); Upper gi endoscopy; and Dental surgery (N/A). Family History:  His family history includes Breast cancer in his mother; Cirrhosis in his mother; Heart disease in his father and paternal grandfather; Hypertension in his father; Kyphosis in his mother; Rheumatic fever in his mother; Stroke in his father and mother.  REVIEW OF SYSTEMS  : All other systems reviewed and negative except where noted in the History of Present Illness.  PHYSICAL EXAM: There were no vitals taken for this visit. Physical Exam          Alan JONELLE Coombs, PA-C 8:11 AM

## 2024-03-31 ENCOUNTER — Encounter: Payer: Self-pay | Admitting: Licensed Clinical Social Worker

## 2024-03-31 ENCOUNTER — Telehealth: Payer: Self-pay | Admitting: Licensed Clinical Social Worker

## 2024-03-31 NOTE — Patient Instructions (Signed)
 Brent Griffith - I am sorry I was unable to reach you today for our scheduled appointment. I work with [name], NP and am calling to support your healthcare needs. Please contact me at [phone] at your earliest convenience. I look forward to speaking with you soon.   Thank you,  [name] [name], PhD [facility], [facility] Social Worker Direct Dial: [phone]  Fax: [phone]

## 2024-04-01 ENCOUNTER — Other Ambulatory Visit: Payer: Self-pay | Admitting: Nurse Practitioner

## 2024-04-07 ENCOUNTER — Telehealth: Payer: Self-pay

## 2024-04-07 NOTE — Progress Notes (Unsigned)
 Complex Care Management Care Guide Note  04/07/2024 Name: Brent Griffith MRN: [id number] DOB: 26-Jul-1935  Brent Griffith is a 88 y.o. year old male who is a primary care patient of Eubanks, Jessica K, NP and is actively engaged with the care management team. I reached out to Brent Griffith by phone today to assist with re-scheduling  with the BSW.  Follow up plan: Unsuccessful telephone outreach attempt made. A HIPAA compliant phone message was left for the patient providing contact information and requesting a return call.  Leotis Rase [facility], [facility] Guide  Direct Dial: [phone]  Fax [phone]

## 2024-04-08 NOTE — Progress Notes (Signed)
 Complex Care Management Care Guide Note  04/08/2024 Name: Brent Griffith MRN: [id number] DOB: 10/16/1935  Brent Griffith is a 88 y.o. year old male who is a primary care patient of Eubanks, Jessica K, NP and is actively engaged with the care management team. I reached out to Brent SHAUNNA Creamer by phone today to assist with re-scheduling  with the BSW.  Follow up plan: Unsuccessful telephone outreach attempt made. A HIPAA compliant phone message was left for the patient providing contact information and requesting a return call. Pt declined to reschedule with BSW due to having an upcoming appointment with [facility] on 04/14/24.   Leotis Rase [facility], [place] Guide  Direct Dial: [phone]  Fax [phone]

## 2024-04-10 ENCOUNTER — Ambulatory Visit: Admitting: Podiatry

## 2024-04-14 ENCOUNTER — Other Ambulatory Visit: Payer: Self-pay

## 2024-04-14 NOTE — Patient Outreach (Signed)
 Complex Care Management   Visit Note  04/14/2024  Name:  Brent Griffith MRN: [id number] DOB: 15-Nov-1935  Situation: Referral received for Complex Care Management related to Atrial Fibrillation, CAD, Hypertension, Impaired Physical Mobility, Falls. I obtained verbal consent from Patient.  Visit completed with Patient on the phone.  Background:   Past Medical History:  Diagnosis Date   Acute gastric ulcer without mention of hemorrhage, perforation, or obstruction    Allergic rhinitis, cause unspecified    Anemia, unspecified    Arthritis    Asthma    Blood transfusion without reported diagnosis    CAD (coronary artery disease)    Diaphragmatic hernia without mention of obstruction or gangrene    Elevated prostate specific antigen (PSA)    Enlarged prostate    Esophageal reflux    Extrinsic asthma, unspecified    Herpes zoster without mention of complication    Hyperlipidemia    Hypersomnia with sleep apnea, unspecified    Impotence of organic origin    Kyphosis    Lumbago    Obstructive sleep apnea (adult) (pediatric)    Pneumonia    hx of years ago    Senile osteoporosis    Trigger finger (acquired)    Tubular adenoma of colon 05/2013   Unspecified essential hypertension    Unspecified sleep apnea    CPAP- settings 4-12    Unspecified vitamin D deficiency     Assessment: Patient Reported Symptoms:  Cognitive Cognitive Status: Alert and oriented to person, place, and time, Normal speech and language skills Cognitive/Intellectual Conditions Management [RPT]: None reported or documented in medical history or problem list   Health Maintenance Behaviors: Annual physical exam  Neurological Neurological Review of Symptoms: No symptoms reported    HEENT HEENT Symptoms Reported: Other: (runny nose daily, worse in the morning and at nighttime) HEENT Management Strategies: Medication therapy, Routine screening HEENT Self-Management Outcome: 3 (uncertain)    Cardiovascular  Cardiovascular Symptoms Reported: No symptoms reported Does patient have uncontrolled Hypertension?: No Cardiovascular Management Strategies: Medication therapy, Routine screening Cardiovascular Self-Management Outcome: 4 (good)  Respiratory Respiratory Symptoms Reported: Dry cough Other Respiratory Symptoms: related to allergies Respiratory Management Strategies: Routine screening, Medication therapy Respiratory Self-Management Outcome: 3 (uncertain)  Endocrine Endocrine Symptoms Reported: No symptoms reported Is patient diabetic?: No    Gastrointestinal Gastrointestinal Symptoms Reported: Other Other Gastrointestinal Symptoms: stool leakage when eating chocolate Gastrointestinal Management Strategies: Diet modification Gastrointestinal Self-Management Outcome: 4 (good)    Genitourinary Genitourinary Symptoms Reported: Frequency, Incontinence, Urgency Genitourinary Management Strategies: Incontinence garment/pad, Fluid modification Genitourinary Self-Management Outcome: 4 (good)  Integumentary Integumentary Symptoms Reported: Other Other Integumentary Symptoms: multiple warts, follow up with Dermatology Skin Management Strategies: Routine screening Skin Self-Management Outcome: 4 (good)  Musculoskeletal Musculoskelatal Symptoms Reviewed: Unsteady gait, Limited mobility, Difficulty walking, Back pain Musculoskeletal Management Strategies: Adequate rest, Routine screening, Medical device Musculoskeletal Self-Management Outcome: 4 (good) Falls in the past year?: Yes Number of falls in past year: 1 or less Was there an injury with Fall?: Yes Fall Risk Category Calculator: 2 Patient Fall Risk Level: Moderate Fall Risk Patient at Risk for Falls Due to: History of fall(s), Impaired balance/gait, Impaired mobility Fall risk Follow up: Education provided, Falls evaluation completed  Psychosocial Psychosocial Symptoms Reported: No symptoms reported   Major Change/Loss/Stressor/Fears (CP):  Medical condition, self (financial strain) Techniques to [employer] with Loss/Stress/Change:  (support system, coping skills) Quality of Family Relationships: helpful, supportive, involved Do you feel physically threatened by others?: No    04/14/2024  PHQ2-9 Depression Screening   Ashyra Cantin interest or pleasure in doing things    Feeling down, depressed, or hopeless    PHQ-2 - Total Score    Trouble falling or staying asleep, or sleeping too much    Feeling tired or having Anber Mckiver energy    Poor appetite or overeating     Feeling bad about yourself - or that you are a failure or have let yourself or your family down    Trouble concentrating on things, such as reading the newspaper or watching television    Moving or speaking so slowly that other people could have noticed.  Or the opposite - being so fidgety or restless that you have been moving around a lot more than usual    Thoughts that you would be better off dead, or hurting yourself in some way    PHQ2-9 Total Score    If you checked off any problems, how difficult have these problems made it for you to do your work, take care of things at home, or get along with other people    Depression Interventions/Treatment      There were no vitals filed for this visit. Pain Scale: Not given for pain  Medications Reviewed Today     Reviewed by Morgan Clayborne CROME, RN (Registered Nurse) on 04/14/24 at 1226  Med List Status: <None>   Medication Order Taking? Sig Documenting Provider Last Dose Status Informant  albuterol  (VENTOLIN  HFA) 108 (90 Base) MCG/ACT inhaler [id number]  Inhale 1-2 puffs into the lungs every 6 (six) hours as needed for wheezing or shortness of breath. [place], Amy E, NP  Active   alum & mag hydroxide-simeth (MAALOX MAX) 400-400-40 MG/5ML suspension [id number]  Take 10 mLs by mouth every 6 (six) hours as needed (pain with swallowing and reflux). Ethyl Richerd BROCKS, MD  Active   amiodarone  (PACERONE ) 200 MG tablet [id number]  TAKE 1  TABLET BY MOUTH DAILY [name], MD  Active   Ascorbic Acid (VITAMIN C) 1000 MG tablet [id number]  Take 1,000 mg by mouth daily.  Patient not taking: Reported on 01/20/2024   [provider]  Active   Calcium  Citrate-Vitamin D (EQ CALCIUM  CITRATE+D3 PO) [id number]  Take 1,200 mg by mouth daily. [provider]  Active Self           Med Note MARTHA DEVIN BROCKS Pablo May 27, 2023  2:23 PM)    carvedilol  (COREG ) 3.125 MG tablet [id number]  TAKE 1 TABLET BY MOUTH TWICE  DAILY [name], MD  Active   cetirizine (ZYRTEC) 10 MG tablet [id number]  Take 10 mg by mouth daily as needed for allergies. [provider]  Active   Coenzyme Q10 (COQ10) 200 MG CAPS [id number]  Take 200 mg by mouth daily. [provider]  Active Self  COVID-19 mRNA vaccine, Moderna, >/= 41yrs, (SPIKEVAX ) injection [id number]  Inject 0.5 mLs into the muscle as directed. [name], NP  Active   doxazosin  (CARDURA ) 2 MG tablet [id number]  TAKE 1 TABLET BY MOUTH DAILY [name], MD  Active   famotidine  (PEPCID ) 20 MG tablet [id number]  TAKE 1 TABLET BY MOUTH TWICE  DAILY McMichael, Bayley M, PA-C  Active   finasteride  (PROSCAR ) 5 MG tablet [id number]  TAKE 1 TABLET BY MOUTH IN  THE MORNING Eubanks, Jessica K, NP  Active   fluticasone  (FLONASE ) 50 MCG/ACT nasal spray [id number]  Place 1 spray into both nostrils daily.  Patient not taking:  Reported on 03/20/2024   Griselda Norris, MD  Active   furosemide  (LASIX ) 20 MG tablet [id number]  TAKE 1 TABLET BY MOUTH DAILY AS  NEEDED FOR LEG SWELLING  Patient not taking: Reported on 01/20/2024   [name], MD  Active   Iron, Ferrous Sulfate, 325 (65 Fe) MG TABS [id number]  Take 325 mg by mouth 2 (two) times daily. [name], NP  Active   JARDIANCE  10 MG TABS tablet [id number]  TAKE 1 TABLET BY MOUTH DAILY BEFORE BREAKFAST [name], MD  Active   lactose free nutrition (BOOST) LIQD [id number]  Take 237 mLs by mouth  every Monday, Wednesday, and Friday. [provider]  Active   lidocaine  (LIDODERM ) 5 % [id number]  Place 1 patch onto the skin daily. Remove & Discard patch within 12 hours or as directed by MD Randol Simmonds, MD  Active   losartan  (COZAAR ) 25 MG tablet [id number]  TAKE 1 TABLET BY MOUTH DAILY  PLEASE KEEP SCHEDULED  APPOINTMENT WITH CARDIOLOGIST [name], MD  Active   mexiletine (MEXITIL ) 150 MG capsule [id number]  Take 1 capsule (150 mg total) by mouth 2 (two) times daily. [name], MD  Active   oxyCODONE  (ROXICODONE ) 5 MG immediate release tablet [id number]  Take 1 tablet (5 mg total) by mouth every 6 (six) hours as needed for severe pain (pain score 7-10). Zackowski, Scott, MD  Active   pantoprazole  (PROTONIX ) 40 MG tablet [id number]  Take 1 tablet (40 mg total) by mouth daily. Mollie Pfeiffer M, PA-C  Active   rosuvastatin  (CRESTOR ) 20 MG tablet [id number]  TAKE 1 TABLET BY MOUTH ONCE  DAILY FOR CHOLESTEROL Eubanks, Jessica K, NP  Active   warfarin (COUMADIN ) 2.5 MG tablet [id number]  TAKE 1/2 TO 1 TABLET BY MOUTH  DAILY AS DIRECTED BY THE  COUMADIN  CLINIC [place], Redell RAMAN, MD  Active   Med List Note Vernel Reggy BIRCH, MD 07/15/14 2050): CPAP Advanced AutoPap 6-12            Recommendation:   Specialty provider follow-up  04/16/2024 Status: Sch   Time: 2:00 PM Length: 15  Visit Type: EST COUM [498] Copay: $20.00  Provider: [facility] Department: [facility]    04/23/2024 Status: Sch   Time: 2:00 PM Length: 15  Visit Type: OFFICE VISIT 30 [368] Copay: $0.00  Provider: Neysa Reggy BIRCH, MD Department: [facility]   05/11/2024 Status: Sch   Time: 2:40 PM Length: 20  Visit Type: OFFICE VISIT [8002] Copay: $0.00  Provider: [name], NP Department: [facility]    05/13/2024 Status: Sch   Time: 3:00 PM Length: 20  Visit Type: FOLLOW UP 20 [336] Copay: $0.00  Provider: Craig Alan SAUNDERS, PA-C Department: [facility]    05/15/2024 Status: Sch   Time: 10:45 AM Length: 15  Visit Type: EST PT 15 [330] Copay: $0.00  Provider: Joya Stabs, DPM Department: [facility]    Follow Up Plan:   Telephone follow up appointment date/time  05/15/2024 Status: Sch   Time: 2:00 PM Length: 30  Visit Type: VBCI TELEPHONE CALL 30 [2502] Copay: $0.00  Provider: Morgan Clayborne CROME, RN Department: [facility]   Clayborne Morgan RN BSN CCM [place]  [facility], [facility] Health Nurse Care Coordinator  Direct Dial: [phone] Website: [email or web address]

## 2024-04-15 NOTE — Patient Instructions (Signed)
 Visit Information  Thank you for taking time to visit with me today. Please don't hesitate to contact me if I can be of assistance to you before our next scheduled appointment.  Your next care management appointment is by telephone on Friday, January 9 at 2:00 PM  Please call the care guide team at [phone] if you need to cancel, schedule, or reschedule an appointment.   Please call 1-800-273-TALK (toll free, 24 hour hotline) if you are experiencing a Mental Health or Behavioral Health Crisis or need someone to talk to.  Clayborne Ly RN BSN CCM [place]  [facility], [facility] Health Nurse Care Coordinator  Direct Dial: [phone] Website: [email or web address]

## 2024-04-16 ENCOUNTER — Ambulatory Visit

## 2024-04-16 ENCOUNTER — Telehealth: Payer: Self-pay

## 2024-04-16 NOTE — Progress Notes (Signed)
° °  Telephone encounter was:  Unsuccessful.  04/16/2024 Name: Brent Griffith MRN: [id number] DOB: 1935/12/26  Unsuccessful outbound call made today to assist with:  Food Insecurity  Outreach Attempt:  1st Attempt  No answer and unable to leave a message    [name] [facility]  [facility] Guide, Phone: [phone] Fax: [phone] Website: [email or web address]

## 2024-04-17 ENCOUNTER — Telehealth: Payer: Self-pay

## 2024-04-17 NOTE — Progress Notes (Signed)
° °  Telephone encounter was:  Unsuccessful.  04/17/2024 Name: ORIEL RUMBOLD MRN: [id number] DOB: 08-Dec-1935  Unsuccessful outbound call made today to assist with:  Food Insecurity  Outreach Attempt:  2nd Attempt No answer and unable to leave a message    [name] [facility]  [facility] Guide, Phone: [phone] Fax: [phone] Website: [email or web address]

## 2024-04-21 ENCOUNTER — Telehealth: Payer: Self-pay

## 2024-04-21 NOTE — Progress Notes (Signed)
° °  Telephone encounter was:  Unsuccessful.  04/21/2024 Name: Brent Griffith MRN: [id number] DOB: 07-01-1935  Unsuccessful outbound call made today to assist with:  Food Insecurity  Outreach Attempt:  3rd Attempt.  Referral closed unable to contact patient.  No answer and unable to leave a message    [name] [facility] Guide, Phone: [phone] Fax: [phone] Website: [email or web address]

## 2024-04-22 ENCOUNTER — Other Ambulatory Visit: Payer: Self-pay | Admitting: Gastroenterology

## 2024-04-22 DIAGNOSIS — K219 Gastro-esophageal reflux disease without esophagitis: Secondary | ICD-10-CM

## 2024-04-23 ENCOUNTER — Ambulatory Visit: Payer: Medicare Other | Admitting: Internal Medicine

## 2024-04-28 ENCOUNTER — Ambulatory Visit: Attending: Cardiology | Admitting: *Deleted

## 2024-04-28 DIAGNOSIS — I4891 Unspecified atrial fibrillation: Secondary | ICD-10-CM

## 2024-04-28 DIAGNOSIS — Z5181 Encounter for therapeutic drug level monitoring: Secondary | ICD-10-CM | POA: Diagnosis not present

## 2024-04-28 LAB — POCT INR: INR: 1.8 — AB (ref 2.0–3.0)

## 2024-04-28 NOTE — Progress Notes (Signed)
 Description   INR today 1.8; Take 1 tablet of warfarin today then START taking warfarin 1/2 a tablet daily except for 1 tablet on Mondays, Wednesdays and Fridays. Recheck INR in 2 weeks.  Stay consistent with boost/ensure (Mon, [place], and Fri)  Coumadin  Clinic [phone]

## 2024-04-28 NOTE — Patient Instructions (Signed)
 Description   INR today 1.8; Take 1 tablet of warfarin today then START taking warfarin 1/2 a tablet daily except for 1 tablet on Mondays, Wednesdays and Fridays. Recheck INR in 2 weeks.  Stay consistent with boost/ensure (Mon, [place], and Fri)  Coumadin  Clinic [phone]

## 2024-04-29 ENCOUNTER — Ambulatory Visit: Admitting: Pulmonary Disease

## 2024-04-29 ENCOUNTER — Encounter: Payer: Self-pay | Admitting: Pulmonary Disease

## 2024-04-29 VITALS — BP 114/55 | Temp 98.0°F | Ht 62.0 in | Wt 132.2 lb

## 2024-04-29 DIAGNOSIS — J302 Other seasonal allergic rhinitis: Secondary | ICD-10-CM

## 2024-04-29 DIAGNOSIS — M419 Scoliosis, unspecified: Secondary | ICD-10-CM | POA: Diagnosis not present

## 2024-04-29 DIAGNOSIS — G4733 Obstructive sleep apnea (adult) (pediatric): Secondary | ICD-10-CM | POA: Diagnosis not present

## 2024-04-29 NOTE — Progress Notes (Signed)
 "  Established Patient Pulmonology Office Visit   Subjective:  Patient ID: Brent Griffith, male    DOB: 06-13-1935   MRN: [id number]  CC:  Chief Complaint  Patient presents with   Follow-up    Sleep-OSA    Brent Griffith is a 88 y.o. patient with cervicothoracic scoliosis (currently using brace), OSA on CPAP, CAD s/p CABG, prior MI, atrial fibrillation, hypertension and GERD who presents for follow up.  He has historically been adherent to use of his CPAP device and is benefiting from its use in terms of reduced EDS symptoms. He received a new device following his last office visit in 04/2023, at which time he reported his CPAP device was showing a motor life exceeded notification.  He remains adherent to CPAP therapy but endorses EDS symptoms today. Falls asleep in chair watching Jeopardy.  He and his wife endorse a delayed circadian phase. He tends to go to bed at about 3 am and wake around noon. This does not cause any problems nor interfere with their activities.  Reports some scratchiness / irritation of his throat at night over the past two weeks. Stopped taking his Zyrtec about a week ago due to concern it was making him more sleepy during the daytime.  CPAP Data: - Settings: AutoCPAP 6-12 cm H2O - Usage days >\= 4h: 73% - Median usage (days used): 5 hrs 10 mins - Pressure: Median 9.2, 95% 11.6 - Leak: Median 5.0, 95% 15.1 - Device-estimated AHI: 0.7   Current Medications[1]      Objective:  BP (!) 114/55 (BP Location: Left Arm, Patient Position: Sitting, Cuff Size: Large)   Temp 98 F (36.7 C)   Ht 5' 2 (1.575 m)   Wt 132 lb 3.2 oz (60 kg)   SpO2 97%   BMI 24.18 kg/m    Physical Exam Constitutional:      General: He is not in acute distress.    Appearance: Normal appearance. He is normal weight.     Comments: Using a cervicothoracic brace.  HENT:     Head: Normocephalic and atraumatic.     Mouth/Throat:     Mouth: Mucous membranes are moist.     Pharynx:  Oropharynx is clear. No oropharyngeal exudate or posterior oropharyngeal erythema.     Comments: MP IV, no tongue scalloping, suboptimal dentition with numerous missing teeth. Eyes:     General: No scleral icterus.    Conjunctiva/sclera: Conjunctivae normal.  Pulmonary:     Effort: Pulmonary effort is normal.  Neurological:     Mental Status: He is alert and oriented to person, place, and time.  Psychiatric:        Mood and Affect: Mood normal.        Behavior: Behavior normal.        Thought Content: Thought content normal.      Diagnostic Review:  RAR:[facility] metabolic panel Lab Results  Component Value Date   WBC 5.5 01/24/2024   HGB 12.0 (L) 01/24/2024   HCT 35.6 (L) 01/24/2024   MCV 90.6 01/24/2024   MCH 30.5 01/24/2024   RDW 12.5 01/24/2024   PLT 153 01/24/2024   Metabolic Panel : Last metabolic panel Lab Results  Component Value Date   GLUCOSE 96 01/24/2024   NA 137 01/24/2024   K 4.0 01/24/2024   CL 104 01/24/2024   CO2 22 01/24/2024   BUN 21 01/24/2024   CREATININE 1.11 01/24/2024   GFRNONAA >60 01/24/2024   CALCIUM  8.7 (L) 01/24/2024  PROT 6.0 10/03/2023   ALBUMIN 3.8 10/03/2023   LABGLOB 2.2 10/03/2023   AGRATIO 2.2 08/31/2022   BILITOT 0.8 10/03/2023   ALKPHOS 57 10/03/2023   AST 29 10/03/2023   ALT 25 10/03/2023   ANIONGAP 10 01/24/2024   No results found for: BNP     Assessment & Plan:   Assessment & Plan OSA (obstructive sleep apnea) His CPAP data and presence of EDS symptoms suggests he could benefit from prolongation of use / increased hours of use each day as well as a slight increase in his CPAP pressure as his 95% pressure is approaching the upper pressure limit of his device.  Will adjust CPAP pressure to 8-15 cm H2O. Continue use of FFM. Leak is low.  Orders:   Ambulatory Referral for DME  Scoliosis of cervicothoracic spine, unspecified scoliosis type His serum bicarbonate of 22 argues against any contribution of hypoventilation  owing to scoliosis / restrictive thoracic disorder.    Seasonal and perennial allergic rhinitis Resume Zyrtec. I counseled the patient that it is very unlikely this is contributing to his EDS symptoms (see above) and he is endorsing allergy symptoms since he stopped taking this.     Return in about 4 months (around 08/28/2024) for Follow up: CPAP.   Time spent on day of this encounter (includes time spent face-to-face with the patient as well as time spent the same day as the encounter reviewing existing data and notes, and/or documenting my findings and the plan of care): 31 minutes  Lamar JINNY Dales, MD     [1]  Current Outpatient Medications:    albuterol  (VENTOLIN  HFA) 108 (90 Base) MCG/ACT inhaler, Inhale 1-2 puffs into the lungs every 6 (six) hours as needed for wheezing or shortness of breath., Disp: 34 g, Rfl: 3   alum & mag hydroxide-simeth (MAALOX MAX) 400-400-40 MG/5ML suspension, Take 10 mLs by mouth every 6 (six) hours as needed (pain with swallowing and reflux)., Disp: 355 mL, Rfl: 0   amiodarone  (PACERONE ) 200 MG tablet, TAKE 1 TABLET BY MOUTH DAILY, Disp: 90 tablet, Rfl: 2   Ascorbic Acid (VITAMIN C) 1000 MG tablet, Take 1,000 mg by mouth daily., Disp: , Rfl:    Calcium  Citrate-Vitamin D (EQ CALCIUM  CITRATE+D3 PO), Take 1,200 mg by mouth daily., Disp: , Rfl:    carvedilol  (COREG ) 3.125 MG tablet, TAKE 1 TABLET BY MOUTH TWICE  DAILY, Disp: 180 tablet, Rfl: 3   cetirizine (ZYRTEC) 10 MG tablet, Take 10 mg by mouth daily as needed for allergies., Disp: , Rfl:    Coenzyme Q10 (COQ10) 200 MG CAPS, Take 200 mg by mouth daily., Disp: , Rfl:    COVID-19 mRNA vaccine, Moderna, >/= 100yrs, (SPIKEVAX ) injection, Inject 0.5 mLs into the muscle as directed., Disp: 0.5 mL, Rfl: 1   doxazosin  (CARDURA ) 2 MG tablet, TAKE 1 TABLET BY MOUTH DAILY, Disp: 100 tablet, Rfl: 3   famotidine  (PEPCID ) 20 MG tablet, TAKE 1 TABLET BY MOUTH TWICE  DAILY, Disp: 200 tablet, Rfl: 2   finasteride  (PROSCAR )  5 MG tablet, TAKE 1 TABLET BY MOUTH IN  THE MORNING, Disp: 90 tablet, Rfl: 3   Iron, Ferrous Sulfate, 325 (65 Fe) MG TABS, Take 325 mg by mouth 2 (two) times daily., Disp: 30 tablet, Rfl:    JARDIANCE  10 MG TABS tablet, TAKE 1 TABLET BY MOUTH DAILY BEFORE BREAKFAST, Disp: 90 tablet, Rfl: 2   lactose free nutrition (BOOST) LIQD, Take 237 mLs by mouth every Monday, Wednesday, and Friday., Disp: ,  Rfl:    lidocaine  (LIDODERM ) 5 %, Place 1 patch onto the skin daily. Remove & Discard patch within 12 hours or as directed by MD, Disp: 10 patch, Rfl: 0   losartan  (COZAAR ) 25 MG tablet, TAKE 1 TABLET BY MOUTH DAILY  PLEASE KEEP SCHEDULED  APPOINTMENT WITH CARDIOLOGIST, Disp: 90 tablet, Rfl: 2   mexiletine (MEXITIL ) 150 MG capsule, Take 1 capsule (150 mg total) by mouth 2 (two) times daily., Disp: 180 capsule, Rfl: 0   oxyCODONE  (ROXICODONE ) 5 MG immediate release tablet, Take 1 tablet (5 mg total) by mouth every 6 (six) hours as needed for severe pain (pain score 7-10)., Disp: 14 tablet, Rfl: 0   pantoprazole  (PROTONIX ) 40 MG tablet, TAKE 1 TABLET BY MOUTH DAILY, Disp: 100 tablet, Rfl: 3   rosuvastatin  (CRESTOR ) 20 MG tablet, TAKE 1 TABLET BY MOUTH ONCE  DAILY FOR CHOLESTEROL, Disp: 100 tablet, Rfl: 0   warfarin (COUMADIN ) 2.5 MG tablet, TAKE 1/2 TO 1 TABLET BY MOUTH  DAILY AS DIRECTED BY THE  COUMADIN  CLINIC, Disp: 80 tablet, Rfl: 1   fluticasone  (FLONASE ) 50 MCG/ACT nasal spray, Place 1 spray into both nostrils daily. (Patient not taking: Reported on 04/29/2024), Disp: 9.9 mL, Rfl: 1   furosemide  (LASIX ) 20 MG tablet, TAKE 1 TABLET BY MOUTH DAILY AS  NEEDED FOR LEG SWELLING (Patient not taking: Reported on 04/29/2024), Disp: 90 tablet, Rfl: 3  "

## 2024-04-29 NOTE — Assessment & Plan Note (Addendum)
 His CPAP data and presence of EDS symptoms suggests he could benefit from prolongation of use / increased hours of use each day as well as a slight increase in his CPAP pressure as his 95% pressure is approaching the upper pressure limit of his device.  Will adjust CPAP pressure to 8-15 cm H2O. Continue use of FFM. Leak is low.  Orders:   Ambulatory Referral for DME

## 2024-04-29 NOTE — Assessment & Plan Note (Signed)
 Resume Zyrtec. I counseled the patient that it is very unlikely this is contributing to his EDS symptoms (see above) and he is endorsing allergy symptoms since he stopped taking this.

## 2024-05-10 ENCOUNTER — Emergency Department (HOSPITAL_COMMUNITY)

## 2024-05-10 ENCOUNTER — Emergency Department (HOSPITAL_COMMUNITY)
Admission: EM | Admit: 2024-05-10 | Discharge: 2024-05-11 | Disposition: A | Discharge status: Home / Self Care | Attending: Emergency Medicine | Admitting: Emergency Medicine

## 2024-05-10 ENCOUNTER — Encounter (HOSPITAL_COMMUNITY): Payer: Self-pay | Admitting: Emergency Medicine

## 2024-05-10 DIAGNOSIS — W01198A Fall on same level from slipping, tripping and stumbling with subsequent striking against other object, initial encounter: Secondary | ICD-10-CM | POA: Insufficient documentation

## 2024-05-10 DIAGNOSIS — I482 Chronic atrial fibrillation, unspecified: Secondary | ICD-10-CM | POA: Diagnosis not present

## 2024-05-10 DIAGNOSIS — N189 Chronic kidney disease, unspecified: Secondary | ICD-10-CM | POA: Insufficient documentation

## 2024-05-10 DIAGNOSIS — R519 Headache, unspecified: Secondary | ICD-10-CM | POA: Insufficient documentation

## 2024-05-10 DIAGNOSIS — I129 Hypertensive chronic kidney disease with stage 1 through stage 4 chronic kidney disease, or unspecified chronic kidney disease: Secondary | ICD-10-CM | POA: Diagnosis not present

## 2024-05-10 DIAGNOSIS — Z79899 Other long term (current) drug therapy: Secondary | ICD-10-CM | POA: Diagnosis not present

## 2024-05-10 DIAGNOSIS — Z7901 Long term (current) use of anticoagulants: Secondary | ICD-10-CM | POA: Insufficient documentation

## 2024-05-10 DIAGNOSIS — Y93K1 Activity, walking an animal: Secondary | ICD-10-CM | POA: Insufficient documentation

## 2024-05-10 DIAGNOSIS — W19XXXA Unspecified fall, initial encounter: Secondary | ICD-10-CM

## 2024-05-10 DIAGNOSIS — M419 Scoliosis, unspecified: Secondary | ICD-10-CM | POA: Diagnosis not present

## 2024-05-10 LAB — CBC WITH DIFFERENTIAL/PLATELET
Abs Immature Granulocytes: 0.03 K/uL (ref 0.00–0.07)
Basophils Absolute: 0 K/uL (ref 0.0–0.1)
Basophils Relative: 0 %
Eosinophils Absolute: 0.1 K/uL (ref 0.0–0.5)
Eosinophils Relative: 2 %
HCT: 41.7 % (ref 39.0–52.0)
Hemoglobin: 13.6 g/dL (ref 13.0–17.0)
Immature Granulocytes: 1 %
Lymphocytes Relative: 19 %
Lymphs Abs: 1 K/uL (ref 0.7–4.0)
MCH: 31.2 pg (ref 26.0–34.0)
MCHC: 32.6 g/dL (ref 30.0–36.0)
MCV: 95.6 fL (ref 80.0–100.0)
Monocytes Absolute: 0.7 K/uL (ref 0.1–1.0)
Monocytes Relative: 12 %
Neutro Abs: 3.7 K/uL (ref 1.7–7.7)
Neutrophils Relative %: 66 %
Platelets: 182 K/uL (ref 150–400)
RBC: 4.36 MIL/uL (ref 4.22–5.81)
RDW: 12.6 % (ref 11.5–15.5)
WBC: 5.5 K/uL (ref 4.0–10.5)
nRBC: 0 % (ref 0.0–0.2)

## 2024-05-10 LAB — COMPREHENSIVE METABOLIC PANEL WITH GFR
ALT: 29 U/L (ref 0–44)
AST: 28 U/L (ref 15–41)
Albumin: 4 g/dL (ref 3.5–5.0)
Alkaline Phosphatase: 58 U/L (ref 38–126)
Anion gap: 9 (ref 5–15)
BUN: 33 mg/dL — ABNORMAL HIGH (ref 8–23)
CO2: 24 mmol/L (ref 22–32)
Calcium: 9.1 mg/dL (ref 8.9–10.3)
Chloride: 104 mmol/L (ref 98–111)
Creatinine, Ser: 1.16 mg/dL (ref 0.61–1.24)
GFR, Estimated: >60 mL/min
Glucose, Bld: 115 mg/dL — ABNORMAL HIGH (ref 70–99)
Potassium: 4.6 mmol/L (ref 3.5–5.1)
Sodium: 136 mmol/L (ref 135–145)
Total Bilirubin: 0.6 mg/dL (ref 0.0–1.2)
Total Protein: 6.5 g/dL (ref 6.5–8.1)

## 2024-05-10 LAB — PROTIME-INR
INR: 1.6 — ABNORMAL HIGH (ref 0.8–1.2)
Prothrombin Time: 19.4 s — ABNORMAL HIGH (ref 11.4–15.2)

## 2024-05-10 NOTE — ED Provider Notes (Signed)
 " [place] EMERGENCY DEPARTMENT AT [facility] Provider Note   CSN: [id number] Arrival date & time: 05/10/24  2247     Patient presents with: Brent Griffith is a 89 y.o. male.   The history is provided by the patient and medical records.  Fall   89 year old male with history of A-fib on Coumadin , dyslipidemia, hypertension, chronic kidney disease, sleep apnea, history of recurrent falls, chronic scoliosis, presenting to the ED after a fall.  Was walking his dog tonight when he got tangled up in the leash.  Fell to his knees, then elbows, and struck the right side of his head on the ground.  There was no loss of consciousness.  Upon EMS arrival he was trying to get up but was not able to make it to his feet.  He states this is not a typical for him when he falls.  He only complains of some mild pain along the side of his head.  He denies any dizziness, confusion, numbness, weakness, nausea, or vomiting.  Prior to Admission medications  Medication Sig Start Date End Date Taking? Authorizing Provider  albuterol  (VENTOLIN  HFA) 108 (90 Base) MCG/ACT inhaler Inhale 1-2 puffs into the lungs every 6 (six) hours as needed for wheezing or shortness of breath. 07/03/22   Fargo, Amy E, NP  alum & mag hydroxide-simeth (MAALOX MAX) 400-400-40 MG/5ML suspension Take 10 mLs by mouth every 6 (six) hours as needed (pain with swallowing and reflux). 10/21/22   Ethyl Richerd BROCKS, MD  amiodarone  (PACERONE ) 200 MG tablet TAKE 1 TABLET BY MOUTH DAILY 01/14/24   [name], MD  Ascorbic Acid (VITAMIN C) 1000 MG tablet Take 1,000 mg by mouth daily.    [provider]  Calcium  Citrate-Vitamin D (EQ CALCIUM  CITRATE+D3 PO) Take 1,200 mg by mouth daily.    [provider]  carvedilol  (COREG ) 3.125 MG tablet TAKE 1 TABLET BY MOUTH TWICE  DAILY 10/01/23   [name], MD  cetirizine (ZYRTEC) 10 MG tablet Take 10 mg by mouth daily as needed for allergies.    [provider]  Coenzyme Q10 (COQ10) 200 MG CAPS Take 200 mg by mouth daily.    [provider]  COVID-19 mRNA vaccine, Moderna, >/= 44yrs, (SPIKEVAX ) injection Inject 0.5 mLs into the muscle as directed. 01/16/24   [name], NP  doxazosin  (CARDURA ) 2 MG tablet TAKE 1 TABLET BY MOUTH DAILY 11/18/23   [name], MD  famotidine  (PEPCID ) 20 MG tablet TAKE 1 TABLET BY MOUTH TWICE  DAILY 10/07/23   McMichael, Bayley M, PA-C  finasteride  (PROSCAR ) 5 MG tablet TAKE 1 TABLET BY MOUTH IN  THE MORNING 10/06/21   Eubanks, Jessica K, NP  fluticasone  (FLONASE ) 50 MCG/ACT nasal spray Place 1 spray into both nostrils daily. Patient not taking: Reported on 04/29/2024 08/23/23   Griselda Norris, MD  furosemide  (LASIX ) 20 MG tablet TAKE 1 TABLET BY MOUTH DAILY AS  NEEDED FOR LEG SWELLING Patient not taking: Reported on 04/29/2024 10/01/23   [name], MD  Iron, Ferrous Sulfate, 325 (65 Fe) MG TABS Take 325 mg by mouth 2 (two) times daily. 09/13/21   [name], NP  JARDIANCE  10 MG TABS tablet TAKE 1 TABLET BY MOUTH DAILY BEFORE BREAKFAST 01/16/24   [name], MD  lactose free nutrition (BOOST) LIQD Take 237 mLs by mouth every Monday, Wednesday, and Friday.    [provider]  lidocaine  (LIDODERM ) 5 %  Place 1 patch onto the skin daily. Remove & Discard patch within 12 hours or as directed by MD 01/24/24   Randol Simmonds, MD  losartan  (COZAAR ) 25 MG tablet TAKE 1 TABLET BY MOUTH DAILY  PLEASE KEEP SCHEDULED  APPOINTMENT WITH CARDIOLOGIST 01/01/24   [name], MD  mexiletine (MEXITIL ) 150 MG capsule Take 1 capsule (150 mg total) by mouth 2 (two) times daily. 03/19/24   [name], MD  oxyCODONE  (ROXICODONE ) 5 MG immediate release tablet Take 1 tablet (5 mg total) by mouth every 6 (six) hours as needed for severe pain (pain score 7-10). 02/05/24   Zackowski, Scott, MD  pantoprazole  (PROTONIX ) 40 MG tablet TAKE 1 TABLET BY MOUTH DAILY 04/22/24   McMichael, Bayley  M, PA-C  rosuvastatin  (CRESTOR ) 20 MG tablet TAKE 1 TABLET BY MOUTH ONCE  DAILY FOR CHOLESTEROL 04/06/24   Eubanks, Jessica K, NP  warfarin (COUMADIN ) 2.5 MG tablet TAKE 1/2 TO 1 TABLET BY MOUTH  DAILY AS DIRECTED BY THE  COUMADIN  CLINIC 01/01/24   [name], MD    Allergies: Compazine [prochlorperazine edisylate]    Review of Systems  Constitutional:        Level II FOT  All other systems reviewed and are negative.   Updated Vital Signs BP (!) 174/80 (BP Location: Left Arm)   Ht 5' 2 (1.575 m)   Wt 55.8 kg   BMI 22.50 kg/m   Physical Exam Vitals and nursing note reviewed.  Constitutional:      Appearance: He is well-developed.  HENT:     Head: Normocephalic and atraumatic.     Comments: No visible head trauma Eyes:     Conjunctiva/sclera: Conjunctivae normal.     Pupils: Pupils are equal, round, and reactive to light.  Neck:     Comments: Wearing full neck/back scoliosis brace, this was removed and switched out for cervical collar Cardiovascular:     Rate and Rhythm: Normal rate and regular rhythm.     Heart sounds: Normal heart sounds.  Pulmonary:     Effort: Pulmonary effort is normal.     Breath sounds: Normal breath sounds.  Abdominal:     General: Bowel sounds are normal.     Palpations: Abdomen is soft.  Musculoskeletal:        General: Normal range of motion.     Comments: Pelvis stable, non-tender, no leg shortening Non-tender knees, ankles, elbows, hands No notable bruising or trauma to extremities  Skin:    General: Skin is warm and dry.  Neurological:     Mental Status: He is alert and oriented to person, place, and time.     Comments: Awake, alert, able to answer questions and follow commands without difficulty, moving extremities on command without focal deficit     (all labs ordered are listed, but only abnormal results are displayed) Labs Reviewed  COMPREHENSIVE METABOLIC PANEL WITH GFR - Abnormal; Notable for the following components:       Result Value   Glucose, Bld 115 (*)    BUN 33 (*)    All other components within normal limits  PROTIME-INR - Abnormal; Notable for the following components:   Prothrombin Time 19.4 (*)    INR 1.6 (*)    All other components within normal limits  CBC WITH DIFFERENTIAL/PLATELET    EKG: None  Radiology: CT Cervical Spine Wo Contrast Result Date: 05/10/2024 EXAM: CT CERVICAL SPINE WITHOUT CONTRAST 05/10/2024 11:14:32 PM TECHNIQUE: CT of the cervical spine was  performed without the administration of intravenous contrast. Multiplanar reformatted images are provided for review. Automated exposure control, iterative reconstruction, and/or weight based adjustment of the mA/kV was utilized to reduce the radiation dose to as low as reasonably achievable. COMPARISON: None available. CLINICAL HISTORY: Neck trauma (Age >= 65y) FINDINGS: BONES AND ALIGNMENT: No acute fracture or traumatic malalignment. 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 DEGENERATIVE CHANGES: No significant degenerative changes. SOFT TISSUES: No prevertebral soft tissue swelling. IMPRESSION: 1. No acute fracture. 2. 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 Electronically signed by: Franky Crease MD 05/10/2024 11:20 PM EST RP Workstation: [id number]   CT Head Wo Contrast Result Date: 05/10/2024 EXAM: CT HEAD WITHOUT 05/10/2024 11:14:32 PM TECHNIQUE: CT of the head was performed without the administration of intravenous contrast. Automated exposure control, iterative reconstruction, and/or weight based adjustment of the mA/kV was utilized to reduce the radiation dose to as low as reasonably achievable. COMPARISON: 08/23/2023 CLINICAL HISTORY: Head trauma, minor (Age >= 65y) FINDINGS: BRAIN AND VENTRICLES: No acute intracranial hemorrhage. No mass effect or midline shift. No extra-axial fluid collection. No evidence of acute infarct. No hydrocephalus. Diffuse cerebral atrophy. 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 ORBITS: No acute abnormality. SINUSES AND MASTOIDS: No acute abnormality. SOFT TISSUES AND SKULL: No acute skull fracture. No acute soft tissue abnormality. IMPRESSION: 1. No acute intracranial abnormality. Electronically signed by: Franky Crease MD 05/10/2024 11:17 PM EST RP Workstation: [id number]     Procedures   Medications Ordered in the ED - No data to display                                  Medical Decision Making Amount and/or Complexity of Data Reviewed Labs: ordered. Radiology: ordered and independent interpretation performed. ECG/medicine tests: ordered and independent interpretation performed.   89 y.o. male no presented to the ED as a level 2 fall on thinners.  He got tangled up in his dog leash and fell to his knees, elbows, and then struck his head.  There was no loss of consciousness.  He is awake, alert, oriented on arrival.  He has no significant signs of head trauma on exam.  He does have scoliosis brace in place, this was removed and switched out for c-collar upon arrival.  She has no focal neurologic deficits.  No pain voiced other than mild headache.  CT head neck negative for any acute findings.  Labs are grossly reassuring.  He continues to feel well here in the emergency department and is eager to go home.  I feel this is appropriate.  Wife is at bedside and will take him home.  Encouraged to follow-up with his PCP.  Return here for new concerns.  Final diagnoses:  Fall, initial encounter    ED Discharge Orders     None          Jarold Olam HERO, PA-C 05/10/24 2345    Charlyn Sora, MD 05/14/24 1420  "

## 2024-05-10 NOTE — ED Triage Notes (Signed)
 Pt here as a level 2 fall on thinners while walking his dog tonight hitting the right side of his head , no loc only compliant is pain to the right side of the head

## 2024-05-10 NOTE — Discharge Instructions (Signed)
 Head/neck CT today and labs were all normal. Recommend close follow-up with your primary care doctor. Return here for new concerns.

## 2024-05-11 ENCOUNTER — Encounter: Payer: Self-pay | Admitting: Nurse Practitioner

## 2024-05-11 ENCOUNTER — Ambulatory Visit: Admitting: Nurse Practitioner

## 2024-05-11 VITALS — BP 116/76 | HR 76 | Temp 97.6°F | Resp 18 | Ht 62.0 in | Wt 130.0 lb

## 2024-05-11 DIAGNOSIS — Z23 Encounter for immunization: Secondary | ICD-10-CM | POA: Diagnosis not present

## 2024-05-11 DIAGNOSIS — I4891 Unspecified atrial fibrillation: Secondary | ICD-10-CM

## 2024-05-11 DIAGNOSIS — I10 Essential (primary) hypertension: Secondary | ICD-10-CM | POA: Diagnosis not present

## 2024-05-11 DIAGNOSIS — D508 Other iron deficiency anemias: Secondary | ICD-10-CM | POA: Diagnosis not present

## 2024-05-11 DIAGNOSIS — R413 Other amnesia: Secondary | ICD-10-CM | POA: Diagnosis not present

## 2024-05-11 DIAGNOSIS — N400 Enlarged prostate without lower urinary tract symptoms: Secondary | ICD-10-CM

## 2024-05-11 DIAGNOSIS — K219 Gastro-esophageal reflux disease without esophagitis: Secondary | ICD-10-CM | POA: Diagnosis not present

## 2024-05-11 DIAGNOSIS — E782 Mixed hyperlipidemia: Secondary | ICD-10-CM

## 2024-05-11 DIAGNOSIS — I5020 Unspecified systolic (congestive) heart failure: Secondary | ICD-10-CM | POA: Diagnosis not present

## 2024-05-11 NOTE — Patient Instructions (Signed)
Make appt with urologist

## 2024-05-11 NOTE — Progress Notes (Signed)
 "   Careteam: Patient Care Team: [name], NP as PCP - General (Geriatric Medicine) [name], MD as PCP - Cardiology (Cardiology) [name], MD as PCP - Electrophysiology (Cardiology) [name], MD as Consulting Physician (Sports Medicine) [name], MD as Referring Physician (Ophthalmology)  PLACE OF SERVICE:  [facility]  Advanced Directive information    Allergies[1]  Chief Complaint  Patient presents with   Medical Management of Chronic Issues     follow up    HPI:  Discussed the use of AI scribe software for clinical note transcription with the patient, who gave verbal consent to proceed.  History of Present Illness Brent Griffith is an 89 year old male who presents for a six-month follow-up visit.  He experienced a fall last night when his dog pulled him down, causing him to hit his head, shoulder, and hip. A CT of the head and neck showed no acute findings. No current shoulder pain and he baseline range of motion without pain  His blood pressure was elevated yesterday, which he attributes to missing his medication doses. He usually takes losartan  25 mg daily and carvedilol  3.125 mg twice daily for blood pressure and atrial fibrillation.  He is on Coumadin  for atrial fibrillation and regularly attends the Coumadin  clinic. His INR was low at 1.6 during the recent emergency visit and with coumadin  clinic. No bruising or bleeding.  He takes an iron supplement for iron deficiency anemia, which was prescribed years ago. Recent labs showed normal hemoglobin levels.  He is on Cardura , doxazosin , and Proscar  for benign prostatic hyperplasia (BPH). He reports new onset of urinary leakage over the past few weeks and occasional weaker urine flow. He uses protective pads for leakage and denies difficulty initiating urination. He has not followed up with his urologist recently.  He takes Protonix  and Pepcid  for acid reflux, which is  well-controlled. He occasionally uses Tums as needed. He is followed by GI.   He is on Crestor  for hyperlipidemia, but his cholesterol levels have not been checked recently.  He mentions some memory concerns and notes that he has experienced cerebral atrophy as seen on a recent CT scan.   Review of Systems:  Review of Systems  Constitutional:  Negative for chills, fever and weight loss.  HENT:  Negative for tinnitus.   Respiratory:  Negative for cough, sputum production and shortness of breath.   Cardiovascular:  Negative for chest pain, palpitations and leg swelling.  Gastrointestinal:  Negative for abdominal pain, constipation, diarrhea and heartburn.  Genitourinary:  Negative for dysuria, frequency and urgency.       Urinary incontinence   Musculoskeletal:  Positive for falls. Negative for back pain, joint pain and myalgias.  Skin: Negative.   Neurological:  Negative for dizziness and headaches.  Psychiatric/Behavioral:  Positive for memory loss. Negative for depression. The patient does not have insomnia.     Past Medical History:  Diagnosis Date   Acute gastric ulcer without mention of hemorrhage, perforation, or obstruction    Allergic rhinitis, cause unspecified    Anemia, unspecified    Arthritis    Asthma    Blood transfusion without reported diagnosis    CAD (coronary artery disease)    Diaphragmatic hernia without mention of obstruction or gangrene    Elevated prostate specific antigen (PSA)    Enlarged prostate    Esophageal reflux    Extrinsic asthma, unspecified    Herpes zoster without mention of complication  Hyperlipidemia    Hypersomnia with sleep apnea, unspecified    Impotence of organic origin    Kyphosis    Lumbago    Obstructive sleep apnea (adult) (pediatric)    Pneumonia    hx of years ago    Senile osteoporosis    Trigger finger (acquired)    Tubular adenoma of colon 05/2013   Unspecified essential hypertension    Unspecified sleep apnea     CPAP- settings 4-12    Unspecified vitamin D deficiency    Past Surgical History:  Procedure Laterality Date   CARDIAC CATHETERIZATION  03/04/2009   Dr Kerrin   COLONOSCOPY     CORONARY ARTERY BYPASS GRAFT  2010   CYSTOSCOPY WITH RETROGRADE PYELOGRAM, URETEROSCOPY AND STENT PLACEMENT Right 10/04/2013   Procedure: CYSTOSCOPY WITH RETROGRADE PYELOGRAM,  AND STENT PLACEMENT;  Surgeon: Donnice Brooks, MD;  Location: [facility];  Service: Urology;  Laterality: Right;   DENTAL SURGERY N/A    MOLE REMOVAL  1958   chin   ROTATOR CUFF REPAIR Left 04/1998   with bone spur removed, Dr Shari   TONSILLECTOMY  1945   TRANSURETHRAL RESECTION OF PROSTATE N/A 09/24/2013   Procedure: TRANSURETHRAL RESECTION OF THE PROSTATE (TURP) WITH GYRUS (STAGED RIGHT LATERAL AND MEDIAN LOBE);  Surgeon: Arlena LILLETTE Gal, MD;  Location: [facility];  Service: Urology;  Laterality: N/A;   UPPER GI ENDOSCOPY     Social History:   reports that he has never smoked. He has never been exposed to tobacco smoke. He has never used smokeless tobacco. He reports that he does not drink alcohol and does not use drugs.  Family History  Problem Relation Age of Onset   Stroke Mother    Breast cancer Mother    Cirrhosis Mother        wine   Rheumatic fever Mother    Kyphosis Mother    Hypertension Father    Stroke Father    Heart disease Father    Heart disease Paternal Grandfather    Colon cancer Neg Hx     Medications: Patient's Medications  New Prescriptions   No medications on file  Previous Medications   ALBUTEROL  (VENTOLIN  HFA) 108 (90 BASE) MCG/ACT INHALER    Inhale 1-2 puffs into the lungs every 6 (six) hours as needed for wheezing or shortness of breath.   ALUM & MAG HYDROXIDE-SIMETH (MAALOX MAX) 400-400-40 MG/5ML SUSPENSION    Take 10 mLs by mouth every 6 (six) hours as needed (pain with swallowing and reflux).   AMIODARONE  (PACERONE ) 200 MG TABLET    TAKE 1 TABLET BY MOUTH DAILY   ASCORBIC ACID  (VITAMIN C) 1000 MG TABLET    Take 1,000 mg by mouth daily.   CALCIUM  CITRATE-VITAMIN D (EQ CALCIUM  CITRATE+D3 PO)    Take 1,200 mg by mouth daily.   CARVEDILOL  (COREG ) 3.125 MG TABLET    TAKE 1 TABLET BY MOUTH TWICE  DAILY   CETIRIZINE (ZYRTEC) 10 MG TABLET    Take 10 mg by mouth daily as needed for allergies.   COENZYME Q10 (COQ10) 200 MG CAPS    Take 200 mg by mouth daily.   COVID-19 MRNA VACCINE, MODERNA, >/= 45YRS, (SPIKEVAX ) INJECTION    Inject 0.5 mLs into the muscle as directed.   DOXAZOSIN  (CARDURA ) 2 MG TABLET    TAKE 1 TABLET BY MOUTH DAILY   FAMOTIDINE  (PEPCID ) 20 MG TABLET    TAKE 1 TABLET BY MOUTH TWICE  DAILY   FINASTERIDE  (PROSCAR ) 5  MG TABLET    TAKE 1 TABLET BY MOUTH IN  THE MORNING   FLUTICASONE  (FLONASE ) 50 MCG/ACT NASAL SPRAY    Place 1 spray into both nostrils daily.   FUROSEMIDE  (LASIX ) 20 MG TABLET    TAKE 1 TABLET BY MOUTH DAILY AS  NEEDED FOR LEG SWELLING   IRON, FERROUS SULFATE, 325 (65 FE) MG TABS    Take 325 mg by mouth 2 (two) times daily.   JARDIANCE  10 MG TABS TABLET    TAKE 1 TABLET BY MOUTH DAILY BEFORE BREAKFAST   LACTOSE FREE NUTRITION (BOOST) LIQD    Take 237 mLs by mouth every Monday, Wednesday, and Friday.   LIDOCAINE  (LIDODERM ) 5 %    Place 1 patch onto the skin daily. Remove & Discard patch within 12 hours or as directed by MD   LOSARTAN  (COZAAR ) 25 MG TABLET    TAKE 1 TABLET BY MOUTH DAILY  PLEASE KEEP SCHEDULED  APPOINTMENT WITH CARDIOLOGIST   MEXILETINE (MEXITIL ) 150 MG CAPSULE    Take 1 capsule (150 mg total) by mouth 2 (two) times daily.   OXYCODONE  (ROXICODONE ) 5 MG IMMEDIATE RELEASE TABLET    Take 1 tablet (5 mg total) by mouth every 6 (six) hours as needed for severe pain (pain score 7-10).   PANTOPRAZOLE  (PROTONIX ) 40 MG TABLET    TAKE 1 TABLET BY MOUTH DAILY   ROSUVASTATIN  (CRESTOR ) 20 MG TABLET    TAKE 1 TABLET BY MOUTH ONCE  DAILY FOR CHOLESTEROL   WARFARIN (COUMADIN ) 2.5 MG TABLET    TAKE 1/2 TO 1 TABLET BY MOUTH  DAILY AS DIRECTED BY THE   COUMADIN  CLINIC  Modified Medications   No medications on file  Discontinued Medications   No medications on file    Physical Exam:  Vitals:   05/11/24 1431  BP: 116/76  Pulse: 76  Resp: 18  Temp: 97.6 F (36.4 C)  SpO2: 97%  Weight: 130 lb (59 kg)  Height: 5' 2 (1.575 m)   Body mass index is 23.78 kg/m. Wt Readings from Last 3 Encounters:  05/11/24 130 lb (59 kg)  05/10/24 123 lb (55.8 kg)  04/29/24 132 lb 3.2 oz (60 kg)    Physical Exam Constitutional:      General: He is not in acute distress.    Appearance: He is well-developed. He is not diaphoretic.  HENT:     Head: Normocephalic and atraumatic.     Right Ear: External ear normal.     Left Ear: External ear normal.     Mouth/Throat:     Pharynx: No oropharyngeal exudate.  Eyes:     Conjunctiva/sclera: Conjunctivae normal.     Pupils: Pupils are equal, round, and reactive to light.  Cardiovascular:     Rate and Rhythm: Normal rate and regular rhythm.     Heart sounds: Normal heart sounds.  Pulmonary:     Effort: Pulmonary effort is normal.     Breath sounds: Normal breath sounds.  Abdominal:     General: Bowel sounds are normal.     Palpations: Abdomen is soft.  Musculoskeletal:        General: No tenderness.     Cervical back: Normal range of motion and neck supple.     Right lower leg: No edema.     Left lower leg: No edema.  Skin:    General: Skin is warm and dry.  Neurological:     Mental Status: He is alert and oriented to person, place,  and time.     Labs reviewed: Basic Metabolic Panel: Recent Labs    10/03/23 1436 01/24/24 1033 05/10/24 2250  NA 140 137 136  K 4.1 4.0 4.6  CL 106 104 104  CO2 20 22 24   GLUCOSE 91 96 115*  BUN 21 21 33*  CREATININE 1.07 1.11 1.16  CALCIUM  8.7 8.7* 9.1  TSH 1.490  --   --    Liver Function Tests: Recent Labs    10/03/23 1436 05/10/24 2250  AST 29 28  ALT 25 29  ALKPHOS 57 58  BILITOT 0.8 0.6  PROT 6.0 6.5  ALBUMIN 3.8 4.0   No  results for input(s): LIPASE, AMYLASE in the last 8760 hours. No results for input(s): AMMONIA in the last 8760 hours. CBC: Recent Labs    08/23/23 0258 01/24/24 1033 05/10/24 2250  WBC 6.9 5.5 5.5  NEUTROABS 5.6  --  3.7  HGB 13.3 12.0* 13.6  HCT 38.8* 35.6* 41.7  MCV 90.7 90.6 95.6  PLT 156 153 182   Lipid Panel: No results for input(s): CHOL, HDL, LDLCALC, TRIG, CHOLHDL, LDLDIRECT in the last 8760 hours. TSH: Recent Labs    10/03/23 1436  TSH 1.490   A1C: Lab Results  Component Value Date   HGBA1C 5.6 07/28/2012     Assessment/Plan  Immunization due -     Flu vaccine HIGH DOSE PF(Fluzone Trivalent)   Assessment and Plan Assessment & Plan Atrial fibrillation Rate controlled on amiodarone  and carvedilol . Previous INR low at 1.6 being managed at coumadin  clinic with increase in coumadin  - Continue amiodarone  200 mg daily. - Continue carvedilol  3.125 mg twice daily. - Continue INR monitoring at Coumadin  clinic.  Heart failure with reduced ejection fraction Euvolemic status maintained. Managed with Jardiance , carvedilol , losartan , and Lasix  as needed for swelling. Continue cardiology follow up   Essential hypertension Well controlled on losartan  and carvedilol . - Continue losartan  25 mg daily. - Continue carvedilol  3.125 mg twice daily.  Iron deficiency anemia Hemoglobin levels are normal. Continues on iron supplementation due to dietary insufficiency. - Continue iron supplementation.  Benign prostatic hyperplasia with lower urinary tract symptoms Experiencing urinary leakage and post-void dribbling. Managed with doxazosin  and Proscar . Missed urology appointment. - Continue doxazosin . - Continue Proscar . - Schedule follow-up with urologist.  Gastroesophageal reflux disease Well controlled with Protonix  and Pepcid . Occasionally uses Tums as needed. - Continue Protonix . - Continue Pepcid  twice daily. - Use Tums as needed.  Mixed  hyperlipidemia Managed with Crestor . Lipid levels not checked recently. - Ordered lipid panel.  Memory loss with cerebral atrophy Reports memory issues, including forgetting medication doses. - Scheduled follow-up appointment in six weeks to discuss memory concerns and CT findings.  General health maintenance Received flu shot.    Return in about 6 weeks (around 06/22/2024) for memory loss .  Javana Schey K. Caro BODILY [facility] [phone]     [1]  Allergies Allergen Reactions   Compazine [Prochlorperazine Edisylate] Anxiety   "

## 2024-05-11 NOTE — ED Notes (Signed)
..  Trauma Response Nurse Documentation   Brent Griffith is a 89 y.o. male arriving to [facility] ED via [facility]  On warfarin daily. Trauma was activated as a Level 2 by charge nurse based on the following trauma criteria Elderly patients > 65 with head trauma on anti-coagulation (excluding ASA).  Patient cleared for CT by Olam Dimes, PA. Pt transported to CT with trauma response nurse present to monitor. RN remained with the patient throughout their absence from the department for clinical observation.   GCS 15.  Trauma MD Arrival Time: N/A.  History   Past Medical History:  Diagnosis Date   Acute gastric ulcer without mention of hemorrhage, perforation, or obstruction    Allergic rhinitis, cause unspecified    Anemia, unspecified    Arthritis    Asthma    Blood transfusion without reported diagnosis    CAD (coronary artery disease)    Diaphragmatic hernia without mention of obstruction or gangrene    Elevated prostate specific antigen (PSA)    Enlarged prostate    Esophageal reflux    Extrinsic asthma, unspecified    Herpes zoster without mention of complication    Hyperlipidemia    Hypersomnia with sleep apnea, unspecified    Impotence of organic origin    Kyphosis    Lumbago    Obstructive sleep apnea (adult) (pediatric)    Pneumonia    hx of years ago    Senile osteoporosis    Trigger finger (acquired)    Tubular adenoma of colon 05/2013   Unspecified essential hypertension    Unspecified sleep apnea    CPAP- settings 4-12    Unspecified vitamin D deficiency      Past Surgical History:  Procedure Laterality Date   CARDIAC CATHETERIZATION  03/04/2009   Dr Kerrin   COLONOSCOPY     CORONARY ARTERY BYPASS GRAFT  2010   CYSTOSCOPY WITH RETROGRADE PYELOGRAM, URETEROSCOPY AND STENT PLACEMENT Right 10/04/2013   Procedure: CYSTOSCOPY WITH RETROGRADE PYELOGRAM,  AND STENT PLACEMENT;  Surgeon: Donnice Brooks, MD;  Location: [facility];  Service: Urology;  Laterality:  Right;   DENTAL SURGERY N/A    MOLE REMOVAL  1958   chin   ROTATOR CUFF REPAIR Left 04/1998   with bone spur removed, Dr Shari   TONSILLECTOMY  1945   TRANSURETHRAL RESECTION OF PROSTATE N/A 09/24/2013   Procedure: TRANSURETHRAL RESECTION OF THE PROSTATE (TURP) WITH GYRUS (STAGED RIGHT LATERAL AND MEDIAN LOBE);  Surgeon: Arlena LILLETTE Gal, MD;  Location: [facility];  Service: Urology;  Laterality: N/A;   UPPER GI ENDOSCOPY         Initial Focused Assessment (If applicable, or please see trauma documentation):   CT's Completed:   CT Head and CT C-Spine   Interventions:   Plan for disposition:  Discharge home   Consults completed:  none  Event Summary: Pt arrived via [facility] from home after reports of getting tripped while walking his large dog outside, pt reports hitting L side of head. A & O, GCS 15, no active hemorrhage or wounds noted. Pt disrobbed and assessed, VSS. Pt reports taking Warfarin daily.  IV est, labs drawn. Transported to/from CT without incident.  Pt d/c home with negative results  MTP Summary (If applicable): N/A  Bedside handoff with ED RN Chad.    Athziry Millican Dee  Trauma Response RN  Please call TRN at [phone] for further assistance.

## 2024-05-12 ENCOUNTER — Ambulatory Visit: Payer: Self-pay | Admitting: Nurse Practitioner

## 2024-05-13 ENCOUNTER — Encounter: Payer: Self-pay | Admitting: Family

## 2024-05-13 ENCOUNTER — Ambulatory Visit: Admitting: Physician Assistant

## 2024-05-13 ENCOUNTER — Other Ambulatory Visit (HOSPITAL_BASED_OUTPATIENT_CLINIC_OR_DEPARTMENT_OTHER): Payer: Self-pay

## 2024-05-13 ENCOUNTER — Encounter: Payer: Self-pay | Admitting: Physician Assistant

## 2024-05-13 VITALS — BP 130/68 | HR 60 | Ht 62.0 in | Wt 129.5 lb

## 2024-05-13 DIAGNOSIS — Z951 Presence of aortocoronary bypass graft: Secondary | ICD-10-CM

## 2024-05-13 DIAGNOSIS — N1831 Chronic kidney disease, stage 3a: Secondary | ICD-10-CM

## 2024-05-13 DIAGNOSIS — I5042 Chronic combined systolic (congestive) and diastolic (congestive) heart failure: Secondary | ICD-10-CM | POA: Diagnosis not present

## 2024-05-13 DIAGNOSIS — I2510 Atherosclerotic heart disease of native coronary artery without angina pectoris: Secondary | ICD-10-CM

## 2024-05-13 DIAGNOSIS — K219 Gastro-esophageal reflux disease without esophagitis: Secondary | ICD-10-CM | POA: Diagnosis not present

## 2024-05-13 DIAGNOSIS — I480 Paroxysmal atrial fibrillation: Secondary | ICD-10-CM

## 2024-05-13 DIAGNOSIS — I5020 Unspecified systolic (congestive) heart failure: Secondary | ICD-10-CM

## 2024-05-13 DIAGNOSIS — D508 Other iron deficiency anemias: Secondary | ICD-10-CM

## 2024-05-13 DIAGNOSIS — I4891 Unspecified atrial fibrillation: Secondary | ICD-10-CM

## 2024-05-13 DIAGNOSIS — Z860101 Personal history of adenomatous and serrated colon polyps: Secondary | ICD-10-CM | POA: Diagnosis not present

## 2024-05-13 DIAGNOSIS — Z7901 Long term (current) use of anticoagulants: Secondary | ICD-10-CM

## 2024-05-13 DIAGNOSIS — D509 Iron deficiency anemia, unspecified: Secondary | ICD-10-CM | POA: Diagnosis not present

## 2024-05-13 DIAGNOSIS — R1310 Dysphagia, unspecified: Secondary | ICD-10-CM | POA: Diagnosis not present

## 2024-05-13 LAB — TSH: TSH: 1.43 m[IU]/L (ref 0.40–4.50)

## 2024-05-13 LAB — LIPID PANEL
Cholesterol: 138 mg/dL
HDL: 77 mg/dL
LDL Cholesterol (Calc): 48 mg/dL
Non-HDL Cholesterol (Calc): 61 mg/dL
Total CHOL/HDL Ratio: 1.8 (calc)
Triglycerides: 51 mg/dL

## 2024-05-13 LAB — SYPHILIS: RPR W/REFLEX TO RPR TITER AND TREPONEMAL ANTIBODIES, TRADITIONAL SCREENING AND DIAGNOSIS ALGORITHM: RPR Ser Ql: NONREACTIVE

## 2024-05-13 LAB — VITAMIN B12: Vitamin B-12: 316 pg/mL (ref 200–1100)

## 2024-05-13 MED ORDER — VITAMIN B-12 1000 MCG PO TABS: 1000.0000 ug | ORAL_TABLET | Freq: Every morning | ORAL | Status: AC

## 2024-05-13 MED ORDER — PANTOPRAZOLE SODIUM 40 MG PO TBEC: 40.0000 mg | DELAYED_RELEASE_TABLET | Freq: Every day | ORAL | 3 refills | Status: AC

## 2024-05-13 MED ORDER — FAMOTIDINE 20 MG PO TABS: 20.0000 mg | ORAL_TABLET | Freq: Two times a day (BID) | ORAL | 3 refills | Status: AC

## 2024-05-13 NOTE — Patient Instructions (Addendum)
 VISIT SUMMARY:  You visited today to manage your heartburn and reflux symptoms, as well as to review your iron deficiency anemia. Your current medications are effectively controlling your symptoms, and you are tolerating them well.  YOUR PLAN:  GASTROESOPHAGEAL REFLUX DISEASE: Your heartburn and reflux symptoms are well-controlled with your current medications. -Continue taking pantoprazole  40 mg daily in the morning. Your prescription has been refilled for a 100-day supply and sent to [facility]. -Continue taking famotidine  twice daily. Your prescription has been refilled for a 100-day supply and sent to [facility]. -If your symptoms worsen, you have the option to increase pantoprazole  to twice daily, but this is not needed at this time. -Continue using your seven-day pill organizer as you have been.   Thank you for trusting me with your gastrointestinal care!   Alan Coombs, PA-C  _______________________________________________________  If your blood pressure at your visit was 140/90 or greater, please contact your primary care physician to follow up on this.  _______________________________________________________  If you are age 17 or older, your body mass index should be between 23-30. Your Body mass index is 23.69 kg/m. If this is out of the aforementioned range listed, please consider follow up with your Primary Care Provider.  If you are age 35 or younger, your body mass index should be between 19-25. Your Body mass index is 23.69 kg/m. If this is out of the aformentioned range listed, please consider follow up with your Primary Care Provider.   ________________________________________________________  The [facility] providers would like to encourage you to use MYCHART to communicate with providers for non-urgent requests or questions.  Due to long hold times on the telephone, sending your provider a message by [facility] may be a faster and more efficient way to get a response.   Please allow 48 business hours for a response.  Please remember that this is for non-urgent requests.  _______________________________________________________  [facility] is using a team-based approach to care.  Your team is made up of your doctor and two to three APPS. Our APPS (Nurse Practitioners and Physician Assistants) work with your physician to ensure care continuity for you. They are fully qualified to address your health concerns and develop a treatment plan. They communicate directly with your gastroenterologist to care for you. Seeing the Advanced Practice Practitioners on your physician's team can help you by facilitating care more promptly, often allowing for earlier appointments, access to diagnostic testing, procedures, and other specialty referrals.

## 2024-05-13 NOTE — Progress Notes (Signed)
 "    05/13/2024 Brent Griffith [id number] [birth year]  Referring provider: [name], NP Primary GI doctor: Dr. San  ASSESSMENT AND PLAN:  GERD with dysphagia and severe kyphosis 01/14/2021 CTAP W chronic gastric wall thickening not significantly changed since 2015 and potentially in part due to underdistention, unchanged cholelithiasis On pantoprazole  40 mg and pepcid  40 mg with resolutions of symptoms Has chin strap about a year ago that holds his head up that has helped with swallowing - refill pantoprazole  40 mg once dialy, refill pepcid  -Lifestyle changes discussed, avoid NSAIDS, ETOH, hand out given to the patient - Discussed option of increasing pantoprazole  to twice daily if symptoms worsen, though not indicated at this time. - Reviewed pill organization strategy and confirmed comfort with current regimen. - consider MBS/UGI if any worsening issues  Personal history of adenomatous polyps  6 mm tubular adenoma was removed at last colonoscopy January 2015.  Based on current guidelines he would have been due for a 7-year interval colonoscopy in January 2022.  No future surveillance advised at this time.   Chronic combined systolic/diastolic heart failure 09/12/2023 echo EF 5055% grade 1 diastolic dysfunction mild MR mild AR no aortic stenosis  PAF On Coumadin   CAD  status post bypass  IDA 05/10/2024  HGB 13.6 MCV 95.6 Platelets 182 03/16/2022 Iron 96 Ferritin 75 B12 316 Recent Labs    08/23/23 0258 01/24/24 1033 05/10/24 2250  HGB 13.3 12.0* 13.6  Continue B12 supplement, continue iron once daily Continue to monitor  Patient Care Team: [name], NP as PCP - General (Geriatric Medicine) [name], MD as PCP - Cardiology (Cardiology) [name], MD as PCP - Electrophysiology (Cardiology) [name], MD as Consulting Physician (Sports Medicine) [name], MD as Referring Physician (Ophthalmology)  HISTORY OF PRESENT  ILLNESS: 89 y.o. male with a past medical history of GERD, adenomatous polyps, chronic combined systolic/diastolic heart failure, CAD s/p CABG, PAF (on chronic anticoagulation), BPH and others listed below presents for evaluation of medication refill.   Last seen in the office 12/14/2022 by Con Blower.  Discussed the use of AI scribe software for clinical note transcription with the patient, who gave verbal consent to proceed.  History of Present Illness   Brent Griffith is an 89 year old male with gastroesophageal reflux disease and iron deficiency anemia who presents for management of heartburn and reflux symptoms.  Reports a history of heartburn and reflux symptoms that were previously uncontrolled after discontinuing medication approximately 18 months ago. Symptoms improved and have remained controlled since resuming pantoprazole  40 mg daily in the morning, famotidine  twice daily, and omeprazole . Uses a seven-day pill organizer and prefers a 100-day supply for medications. Denies trouble swallowing food but notes that pills sometimes feel stuck in his throat. No issues with eating since starting the current regimen about a year ago. Denies heartburn, reflux, or other upper gastrointestinal symptoms while on this regimen.  Iron deficiency anemia has been stable and within normal limits on recent checks. Continues iron supplementation without stomach upset or constipation. Started a B12 tablet today due to previously low B12 levels. Denies weight loss and reports stable weight at 122.2 pounds. Notes dark black stools, attributed to iron supplementation. No abdominal pain, no shiny black stools, and no ongoing diarrhea or constipation. Had one episode of diarrhea a couple of weeks ago, attributed to diet. Reports feeling tired but denies any other new gastrointestinal symptoms.      He  reports  that he has never smoked. He has never been exposed to tobacco smoke. He has never used smokeless  tobacco. He reports that he does not drink alcohol and does not use drugs.  RELEVANT GI HISTORY, IMAGING AND LABS: Results   Labs Iron: Within normal limits Vitamin B12: Low      CBC    Component Value Date/Time   WBC 5.5 05/10/2024 2250   RBC 4.36 05/10/2024 2250   HGB 13.6 05/10/2024 2250   HGB 11.9 (L) 01/18/2023 1450   HGB 12.9 (L) 12/28/2016 1508   HCT 41.7 05/10/2024 2250   HCT 33.2 (L) 01/18/2023 1450   HCT 36.9 (L) 12/28/2016 1508   PLT 182 05/10/2024 2250   PLT 198 01/18/2023 1450   MCV 95.6 05/10/2024 2250   MCV 99 (H) 01/18/2023 1450   MCV 88 12/28/2016 1508   MCH 31.2 05/10/2024 2250   MCHC 32.6 05/10/2024 2250   RDW 12.6 05/10/2024 2250   RDW 12.0 01/18/2023 1450   RDW 12.2 12/28/2016 1508   LYMPHSABS 1.0 05/10/2024 2250   LYMPHSABS 1.6 12/28/2016 1508   MONOABS 0.7 05/10/2024 2250   EOSABS 0.1 05/10/2024 2250   EOSABS 0.1 12/28/2016 1508   BASOSABS 0.0 05/10/2024 2250   BASOSABS 0.0 12/28/2016 1508   Recent Labs    08/23/23 0258 01/24/24 1033 05/10/24 2250  HGB 13.3 12.0* 13.6    CMP     Component Value Date/Time   NA 136 05/10/2024 2250   NA 140 10/03/2023 1436   K 4.6 05/10/2024 2250   K 4.1 08/23/2016 1312   CL 104 05/10/2024 2250   CL 103 08/23/2016 1312   CO2 24 05/10/2024 2250   CO2 26 08/23/2016 1312   GLUCOSE 115 (H) 05/10/2024 2250   BUN 33 (H) 05/10/2024 2250   BUN 21 10/03/2023 1436   CREATININE 1.16 05/10/2024 2250   CREATININE 1.36 (H) 10/22/2022 1557   CALCIUM  9.1 05/10/2024 2250   CALCIUM  8.7 08/23/2016 1312   PROT 6.5 05/10/2024 2250   PROT 6.0 10/03/2023 1436   ALBUMIN 4.0 05/10/2024 2250   ALBUMIN 3.8 10/03/2023 1436   ALBUMIN 4.0 08/23/2016 1312   AST 28 05/10/2024 2250   AST 18 08/23/2016 1312   ALT 29 05/10/2024 2250   ALT 15 08/23/2016 1312   ALKPHOS 58 05/10/2024 2250   ALKPHOS 45 08/23/2016 1312   BILITOT 0.6 05/10/2024 2250   BILITOT 0.8 10/03/2023 1436   GFRNONAA >60 05/10/2024 2250   GFRNONAA 63  10/31/2020 1549   GFRAA 73 10/31/2020 1549      Latest Ref Rng & Units 05/10/2024   10:50 PM 10/03/2023    2:36 PM 01/18/2023    2:50 PM  Hepatic Function  Total Protein 6.5 - 8.1 g/dL 6.5  6.0  6.1   Albumin 3.5 - 5.0 g/dL 4.0  3.8  3.7   AST 15 - 41 U/L 28  29  22    ALT 0 - 44 U/L 29  25  23    Alk Phosphatase 38 - 126 U/L 58  57  59   Total Bilirubin 0.0 - 1.2 mg/dL 0.6  0.8  0.6       Current Medications:   Current Outpatient Medications (Endocrine & Metabolic):    JARDIANCE  10 MG TABS tablet, TAKE 1 TABLET BY MOUTH DAILY BEFORE BREAKFAST  Current Outpatient Medications (Cardiovascular):    amiodarone  (PACERONE ) 200 MG tablet, TAKE 1 TABLET BY MOUTH DAILY   carvedilol  (COREG ) 3.125 MG tablet, TAKE  1 TABLET BY MOUTH TWICE  DAILY   doxazosin  (CARDURA ) 2 MG tablet, TAKE 1 TABLET BY MOUTH DAILY   losartan  (COZAAR ) 25 MG tablet, TAKE 1 TABLET BY MOUTH DAILY  PLEASE KEEP SCHEDULED  APPOINTMENT WITH CARDIOLOGIST   mexiletine (MEXITIL ) 150 MG capsule, Take 1 capsule (150 mg total) by mouth 2 (two) times daily.   rosuvastatin  (CRESTOR ) 20 MG tablet, TAKE 1 TABLET BY MOUTH ONCE  DAILY FOR CHOLESTEROL  Current Outpatient Medications (Respiratory):    albuterol  (VENTOLIN  HFA) 108 (90 Base) MCG/ACT inhaler, Inhale 1-2 puffs into the lungs every 6 (six) hours as needed for wheezing or shortness of breath.   cetirizine (ZYRTEC) 10 MG tablet, Take 10 mg by mouth daily as needed for allergies.  Current Outpatient Medications (Hematological):    cyanocobalamin (VITAMIN B12) 1000 MCG tablet, Take 1 tablet (1,000 mcg total) by mouth every morning.   Iron, Ferrous Sulfate, 325 (65 Fe) MG TABS, Take 325 mg by mouth 2 (two) times daily.   warfarin (COUMADIN ) 2.5 MG tablet, TAKE 1/2 TO 1 TABLET BY MOUTH  DAILY AS DIRECTED BY THE  COUMADIN  CLINIC  Current Outpatient Medications (Other):    alum & mag hydroxide-simeth (MAALOX MAX) 400-400-40 MG/5ML suspension, Take 10 mLs by mouth every 6 (six) hours  as needed (pain with swallowing and reflux).   Ascorbic Acid (VITAMIN C) 1000 MG tablet, Take 1,000 mg by mouth daily.   Calcium  Citrate-Vitamin D (EQ CALCIUM  CITRATE+D3 PO), Take 1,200 mg by mouth daily.   Coenzyme Q10 (COQ10) 200 MG CAPS, Take 200 mg by mouth daily.   COVID-19 mRNA vaccine, Moderna, >/= 65yrs, (SPIKEVAX ) injection, Inject 0.5 mLs into the muscle as directed.   finasteride  (PROSCAR ) 5 MG tablet, TAKE 1 TABLET BY MOUTH IN  THE MORNING   lactose free nutrition (BOOST) LIQD, Take 237 mLs by mouth every Monday, Wednesday, and Friday.   lidocaine  (LIDODERM ) 5 %, Place 1 patch onto the skin daily. Remove & Discard patch within 12 hours or as directed by MD   famotidine  (PEPCID ) 20 MG tablet, Take 1 tablet (20 mg total) by mouth 2 (two) times daily.   pantoprazole  (PROTONIX ) 40 MG tablet, Take 1 tablet (40 mg total) by mouth daily.  Medical History:  Past Medical History:  Diagnosis Date   Acute gastric ulcer without mention of hemorrhage, perforation, or obstruction    Allergic rhinitis, cause unspecified    Anemia, unspecified    Arthritis    Asthma    Blood transfusion without reported diagnosis    CAD (coronary artery disease)    Diaphragmatic hernia without mention of obstruction or gangrene    Elevated prostate specific antigen (PSA)    Enlarged prostate    Esophageal reflux    Extrinsic asthma, unspecified    Herpes zoster without mention of complication    Hyperlipidemia    Hypersomnia with sleep apnea, unspecified    Impotence of organic origin    Kyphosis    Lumbago    Obstructive sleep apnea (adult) (pediatric)    Pneumonia    hx of years ago    Senile osteoporosis    Trigger finger (acquired)    Tubular adenoma of colon 05/2013   Unspecified essential hypertension    Unspecified sleep apnea    CPAP- settings 4-12    Unspecified vitamin D deficiency    Allergies: Allergies[1]   Surgical History:  He  has a past surgical history that includes  Tonsillectomy (1945); Mole removal (1958); Cardiac catheterization (  03/04/2009); Colonoscopy; Coronary artery bypass graft (2010); Transurethral resection of prostate (N/A, 09/24/2013); Cystoscopy with retrograde pyelogram, ureteroscopy and stent placement (Right, 10/04/2013); Rotator cuff repair (Left, 04/1998); Upper gi endoscopy; and Dental surgery (N/A). Family History:  His family history includes Breast cancer in his mother; Cirrhosis in his mother; Heart disease in his father and paternal grandfather; Hypertension in his father; Kyphosis in his mother; Rheumatic fever in his mother; Stroke in his father and mother.  REVIEW OF SYSTEMS  : All other systems reviewed and negative except where noted in the History of Present Illness.  PHYSICAL EXAM: BP 130/68   Pulse 60   Ht 5' 2 (1.575 m)   Wt 129 lb 8 oz (58.7 kg)   BMI 23.69 kg/m  Physical Exam   Constitutional: Frail, thin, chronically ill-appearing male  head:  Normocephalic and atraumatic. Eyes:   PEERL, EOMI. No icterus. Conjunctiva pink. Respiratory: Respirations even and unlabored. Lungs clear to auscultation bilaterally.   No wheezes, crackles, or rhonchi.  Cardiovascular:  Regular rate and rhythm. No peripheral edema, cyanosis or pallor.  Gastrointestinal:  Soft, nondistended, nontender. No rebound or guarding. Normal bowel sounds. No appreciable masses or hepatomegaly. Rectal:  Not performed.  Msk: walks with walker, severe kyphosis with brace Neurologic:  Alert and  oriented x4;  grossly normal neurologically.  Skin:   Dry and intact without significant lesions or rashes. Psychiatric: Oriented to person, place and time. Demonstrates good judgement and reason without abnormal affect or behaviors.     Alan JONELLE Coombs, PA-C 3:46 PM      [1]  Allergies Allergen Reactions   Compazine [Prochlorperazine Edisylate] Anxiety   "

## 2024-05-14 ENCOUNTER — Ambulatory Visit: Attending: Cardiology | Admitting: Pharmacist

## 2024-05-14 DIAGNOSIS — I4891 Unspecified atrial fibrillation: Secondary | ICD-10-CM | POA: Diagnosis not present

## 2024-05-14 DIAGNOSIS — Z5181 Encounter for therapeutic drug level monitoring: Secondary | ICD-10-CM | POA: Diagnosis not present

## 2024-05-14 LAB — POCT INR: INR: 2 (ref 2.0–3.0)

## 2024-05-14 NOTE — Patient Instructions (Signed)
 Description   INR today 2.0; Continue taking warfarin 1/2 a tablet daily except for 1 tablet on Mondays, Wednesdays and Fridays. Recheck INR in 4 weeks.  Stay consistent with boost/ensure (Mon, [place], and Fri)  Coumadin  Clinic [phone]

## 2024-05-14 NOTE — Progress Notes (Signed)
 Description   INR today 2.0; Continue taking warfarin 1/2 a tablet daily except for 1 tablet on Mondays, Wednesdays and Fridays. Recheck INR in 4 weeks.  Stay consistent with boost/ensure (Mon, [place], and Fri)  Coumadin  Clinic [phone]

## 2024-05-15 ENCOUNTER — Ambulatory Visit: Admitting: Podiatry

## 2024-05-15 ENCOUNTER — Telehealth: Payer: Self-pay

## 2024-05-15 DIAGNOSIS — Z91199 Patient's noncompliance with other medical treatment and regimen due to unspecified reason: Secondary | ICD-10-CM

## 2024-05-15 NOTE — Progress Notes (Signed)
 No show

## 2024-05-27 ENCOUNTER — Telehealth: Payer: Self-pay

## 2024-05-28 ENCOUNTER — Telehealth: Payer: Self-pay

## 2024-05-28 NOTE — Patient Instructions (Signed)
 Norleen SHAUNNA Creamer - I am sorry I was unable to reach you today for our scheduled appointment.   Your next care management appointment is by telephone on Friday, February 6 at 2:30 PM  Please call the care guide team at [phone] if you need to cancel, schedule, or reschedule an appointment.   Please call [phone] (toll free, 24 hour hotline) if you are experiencing a Mental Health or Behavioral Health Crisis or need someone to talk to.  Thank you,   Clayborne Ly RN BSN CCM [place]  [facility], [facility] Nurse Care Coordinator  Direct Dial: [phone] Website: [email or web address]

## 2024-06-11 ENCOUNTER — Ambulatory Visit

## 2024-06-12 ENCOUNTER — Telehealth

## 2024-06-15 ENCOUNTER — Ambulatory Visit: Admitting: Nurse Practitioner

## 2024-06-23 ENCOUNTER — Telehealth

## 2024-08-27 ENCOUNTER — Ambulatory Visit: Admitting: Podiatry

## 2024-09-15 ENCOUNTER — Ambulatory Visit: Admitting: Pulmonary Disease

## 2024-11-16 ENCOUNTER — Encounter: Payer: Self-pay | Admitting: Nurse Practitioner

## 2024-11-20 ENCOUNTER — Ambulatory Visit: Admitting: Nurse Practitioner
